# Patient Record
Sex: Female | Born: 1935 | Race: White | Hispanic: No
Health system: Southern US, Community
[De-identification: ages and names within clinical notes are randomized; demographics above are authoritative.]

## PROBLEM LIST (undated history)

## (undated) DIAGNOSIS — E039 Hypothyroidism, unspecified: Secondary | ICD-10-CM

## (undated) DIAGNOSIS — L84 Corns and callosities: Secondary | ICD-10-CM

## (undated) DIAGNOSIS — E785 Hyperlipidemia, unspecified: Secondary | ICD-10-CM

## (undated) DIAGNOSIS — M1710 Unilateral primary osteoarthritis, unspecified knee: Secondary | ICD-10-CM

## (undated) DIAGNOSIS — N390 Urinary tract infection, site not specified: Secondary | ICD-10-CM

## (undated) DIAGNOSIS — L659 Nonscarring hair loss, unspecified: Secondary | ICD-10-CM

## (undated) DIAGNOSIS — E559 Vitamin D deficiency, unspecified: Secondary | ICD-10-CM

## (undated) DIAGNOSIS — I639 Cerebral infarction, unspecified: Secondary | ICD-10-CM

## (undated) DIAGNOSIS — M2040 Other hammer toe(s) (acquired), unspecified foot: Secondary | ICD-10-CM

## (undated) DIAGNOSIS — I781 Nevus, non-neoplastic: Secondary | ICD-10-CM

## (undated) DIAGNOSIS — K430 Incisional hernia with obstruction, without gangrene: Secondary | ICD-10-CM

## (undated) DIAGNOSIS — G2581 Restless legs syndrome: Secondary | ICD-10-CM

## (undated) DIAGNOSIS — M179 Osteoarthritis of knee, unspecified: Secondary | ICD-10-CM

## (undated) DIAGNOSIS — M7540 Impingement syndrome of unspecified shoulder: Secondary | ICD-10-CM

## (undated) DIAGNOSIS — Z8709 Personal history of other diseases of the respiratory system: Secondary | ICD-10-CM

## (undated) DIAGNOSIS — K219 Gastro-esophageal reflux disease without esophagitis: Secondary | ICD-10-CM

## (undated) DIAGNOSIS — B354 Tinea corporis: Secondary | ICD-10-CM

## (undated) DIAGNOSIS — H269 Unspecified cataract: Secondary | ICD-10-CM

## (undated) DIAGNOSIS — G43909 Migraine, unspecified, not intractable, without status migrainosus: Secondary | ICD-10-CM

## (undated) DIAGNOSIS — M21619 Bunion of unspecified foot: Secondary | ICD-10-CM

## (undated) DIAGNOSIS — H905 Unspecified sensorineural hearing loss: Secondary | ICD-10-CM

## (undated) DIAGNOSIS — H409 Unspecified glaucoma: Secondary | ICD-10-CM

## (undated) DIAGNOSIS — I480 Paroxysmal atrial fibrillation: Secondary | ICD-10-CM

## (undated) DIAGNOSIS — M25559 Pain in unspecified hip: Secondary | ICD-10-CM

## (undated) DIAGNOSIS — L909 Atrophic disorder of skin, unspecified: Secondary | ICD-10-CM

## (undated) DIAGNOSIS — I10 Essential (primary) hypertension: Secondary | ICD-10-CM

## (undated) DIAGNOSIS — D126 Benign neoplasm of colon, unspecified: Secondary | ICD-10-CM

## (undated) DIAGNOSIS — Z87892 Personal history of anaphylaxis: Secondary | ICD-10-CM

## (undated) HISTORY — PX: NEUROPLASTY / TRANSPOSITION MEDIAN NERVE AT CARPAL TUNNEL: SUR893

## (undated) HISTORY — DX: Benign neoplasm of colon, unspecified: D12.6

## (undated) HISTORY — PX: COLONOSCOPY: SHX174

## (undated) HISTORY — DX: Unilateral primary osteoarthritis, unspecified knee: M17.10

## (undated) HISTORY — DX: Urinary tract infection, site not specified: N39.0

## (undated) HISTORY — PX: CHOLECYSTECTOMY: SHX55

## (undated) HISTORY — PX: TUBAL LIGATION: SHX77

## (undated) HISTORY — DX: Personal history of anaphylaxis: Z87.892

## (undated) HISTORY — DX: Restless legs syndrome: G25.81

## (undated) HISTORY — DX: Vitamin D deficiency, unspecified: E55.9

## (undated) HISTORY — DX: Personal history of other diseases of the respiratory system: Z87.09

## (undated) HISTORY — PX: TOTAL HIP ARTHROPLASTY: SHX124

## (undated) HISTORY — DX: Atrophic disorder of skin, unspecified: L90.9

## (undated) HISTORY — DX: Cerebral infarction, unspecified: I63.9

## (undated) HISTORY — DX: Incisional hernia with obstruction, without gangrene: K43.0

## (undated) HISTORY — DX: Corns and callosities: L84

## (undated) HISTORY — PX: OTHER SURGICAL HISTORY: SHX169

## (undated) HISTORY — DX: Nonscarring hair loss, unspecified: L65.9

## (undated) HISTORY — DX: Tinea corporis: B35.4

## (undated) HISTORY — DX: Unspecified cataract: H26.9

## (undated) HISTORY — PX: ABDOMINAL HERNIA REPAIR: SHX539

## (undated) HISTORY — DX: Unspecified sensorineural hearing loss: H90.5

## (undated) HISTORY — DX: Gastro-esophageal reflux disease without esophagitis: K21.9

## (undated) HISTORY — DX: Migraine, unspecified, not intractable, without status migrainosus: G43.909

## (undated) HISTORY — DX: Bunion of unspecified foot: M21.619

## (undated) HISTORY — DX: Other hammer toe(s) (acquired), unspecified foot: M20.40

## (undated) HISTORY — DX: Pain in unspecified hip: M25.559

## (undated) HISTORY — PX: HIATAL HERNIA REPAIR: SHX195

## (undated) HISTORY — DX: Osteoarthritis of knee, unspecified: M17.9

## (undated) HISTORY — DX: Impingement syndrome of unspecified shoulder: M75.40

## (undated) HISTORY — DX: Nevus, non-neoplastic: I78.1

---

## 1998-08-05 ENCOUNTER — Ambulatory Visit (HOSPITAL_COMMUNITY): Admission: RE | Admit: 1998-08-05 | Discharge: 1998-08-05 | Payer: Self-pay | Admitting: Obstetrics and Gynecology

## 1998-12-18 ENCOUNTER — Other Ambulatory Visit: Admission: RE | Admit: 1998-12-18 | Discharge: 1998-12-18 | Payer: Self-pay | Admitting: Obstetrics and Gynecology

## 1999-09-19 ENCOUNTER — Emergency Department (HOSPITAL_COMMUNITY): Admission: EM | Admit: 1999-09-19 | Discharge: 1999-09-19 | Payer: Self-pay | Admitting: Unknown Physician Specialty

## 2000-02-05 ENCOUNTER — Other Ambulatory Visit: Admission: RE | Admit: 2000-02-05 | Discharge: 2000-02-05 | Payer: Self-pay | Admitting: Obstetrics and Gynecology

## 2001-07-26 ENCOUNTER — Other Ambulatory Visit: Admission: RE | Admit: 2001-07-26 | Discharge: 2001-07-26 | Payer: Self-pay | Admitting: Obstetrics and Gynecology

## 2002-09-20 ENCOUNTER — Other Ambulatory Visit: Admission: RE | Admit: 2002-09-20 | Discharge: 2002-09-20 | Payer: Self-pay | Admitting: Obstetrics and Gynecology

## 2006-02-02 ENCOUNTER — Other Ambulatory Visit: Admission: RE | Admit: 2006-02-02 | Discharge: 2006-02-02 | Payer: Self-pay | Admitting: Obstetrics and Gynecology

## 2006-03-09 ENCOUNTER — Ambulatory Visit (HOSPITAL_COMMUNITY): Admission: RE | Admit: 2006-03-09 | Discharge: 2006-03-09 | Payer: Self-pay | Admitting: Obstetrics and Gynecology

## 2016-06-30 ENCOUNTER — Encounter (HOSPITAL_COMMUNITY): Payer: Self-pay | Admitting: Emergency Medicine

## 2016-06-30 ENCOUNTER — Emergency Department (HOSPITAL_COMMUNITY)
Admission: EM | Admit: 2016-06-30 | Discharge: 2016-06-30 | Disposition: A | Discharge status: Home / Self Care | Payer: Medicare Other | Attending: Emergency Medicine | Admitting: Emergency Medicine

## 2016-06-30 DIAGNOSIS — Z96649 Presence of unspecified artificial hip joint: Secondary | ICD-10-CM | POA: Insufficient documentation

## 2016-06-30 DIAGNOSIS — I998 Other disorder of circulatory system: Secondary | ICD-10-CM

## 2016-06-30 DIAGNOSIS — I10 Essential (primary) hypertension: Secondary | ICD-10-CM | POA: Diagnosis not present

## 2016-06-30 DIAGNOSIS — E039 Hypothyroidism, unspecified: Secondary | ICD-10-CM | POA: Diagnosis not present

## 2016-06-30 DIAGNOSIS — M79674 Pain in right toe(s): Secondary | ICD-10-CM | POA: Diagnosis present

## 2016-06-30 HISTORY — DX: Hyperlipidemia, unspecified: E78.5

## 2016-06-30 HISTORY — DX: Unspecified glaucoma: H40.9

## 2016-06-30 HISTORY — DX: Essential (primary) hypertension: I10

## 2016-06-30 HISTORY — DX: Hypothyroidism, unspecified: E03.9

## 2016-06-30 MED ORDER — HYDROCODONE-ACETAMINOPHEN 5-325 MG PO TABS
2.0000 | ORAL_TABLET | Freq: Once | ORAL | Status: AC
  Administered 2016-06-30: 2 via ORAL
  Filled 2016-06-30: qty 2

## 2016-06-30 MED ORDER — HYDROCODONE-ACETAMINOPHEN 5-325 MG PO TABS: 1.0000 | ORAL_TABLET | Freq: Four times a day (QID) | ORAL | 0 refills | Status: DC | PRN

## 2016-06-30 NOTE — ED Notes (Signed)
Pt took daily dose of Plavix, per vascular, vascular to see in office Monday.

## 2016-06-30 NOTE — Discharge Instructions (Signed)
Follow-up with Dr. Doren Custard as recommended to discuss your arteriogram.  Return to the emergency department if your symptoms significantly worsen or change.

## 2016-06-30 NOTE — ED Provider Notes (Signed)
[facility] DEPT Provider Note   CSN: [id number] Arrival date & time: 06/30/16  1417     History   Chief Complaint Chief Complaint  Patient presents with  . Toe Pain    ischemic toes    HPI Alyssa Odonnell is a 82 y.o. female.  Patient is a 82 year old female with past medical history of hypertension and high cholesterol. She presents for evaluation of painful discolored toes. She was sent here for possible ischemic leg. She has noticed these symptoms for the past 3 days and was sent here by her foot doctor for evaluation.   The history is provided by the patient.  Toe Pain  This is a new problem. Episode onset: 3 days ago. The problem occurs constantly. The problem has been gradually worsening. Pertinent negatives include no chest pain and no shortness of breath. The symptoms are aggravated by walking. Nothing relieves the symptoms. She has tried nothing for the symptoms. The treatment provided no relief.    Past Medical History:  Diagnosis Date  . Glaucoma   . Hyperlipidemia   . Hypertension   . Hypothyroid     There are no active problems to display for this patient.   Past Surgical History:  Procedure Laterality Date  . ABDOMINAL HERNIA REPAIR    . TOTAL HIP ARTHROPLASTY      OB History    No data available       Home Medications    Prior to Admission medications   Not on File    Family History History reviewed. No pertinent family history.  Social History Social History  Substance Use Topics  . Smoking status: Never Smoker  . Smokeless tobacco: Never Used  . Alcohol use No     Allergies   Aspirin; Mobic [meloxicam]; Nsaids; and Penicillins   Review of Systems Review of Systems  Respiratory: Negative for shortness of breath.   Cardiovascular: Negative for chest pain.  All other systems reviewed and are negative.    Physical Exam Updated Vital Signs BP 140/73   Pulse 93   Temp 98.5 F (36.9 C) (Oral)   Resp 19   Ht 5\' 3"  (1.6  m)   Wt 190 lb (86.2 kg)   SpO2 97%   BMI 33.66 kg/m   Physical Exam  Constitutional: She is oriented to person, place, and time. She appears well-developed and well-nourished. No distress.  HENT:  Head: Normocephalic and atraumatic.  Neck: Normal range of motion. Neck supple.  Cardiovascular: Normal rate, regular rhythm and normal heart sounds.  Exam reveals no gallop and no friction rub.   No murmur heard. Pulmonary/Chest: Effort normal and breath sounds normal. No respiratory distress. She has no wheezes.  Abdominal: Soft. Bowel sounds are normal. She exhibits no distension. There is no tenderness.  Musculoskeletal: Normal range of motion.  There is bluish discoloration of the third, fourth, and fifth digits of the right foot. They are tender to the touch. She has easily palpable DP pulses bilaterally.  Neurological: She is alert and oriented to person, place, and time.  Skin: Skin is warm and dry. She is not diaphoretic.  Nursing note and vitals reviewed.    ED Treatments / Results  Labs (all labs ordered are listed, but only abnormal results are displayed) Labs Reviewed  BASIC METABOLIC PANEL  CBC WITH DIFFERENTIAL/PLATELET    EKG   Radiology No results found.  Procedures Procedures (including critical care time)  Medications Ordered in ED Medications  HYDROcodone-acetaminophen (NORCO/VICODIN) 5-325  MG per tablet 2 tablet (2 tablets Oral Given 06/30/16 1855)     Initial Impression / Assessment and Plan / ED Course  I have reviewed the triage vital signs and the nursing notes.  Pertinent labs & imaging results that were available during my care of the patient were reviewed by me and considered in my medical decision making (see chart for details).  Clinical Course    The patient was evaluated by Dr. Scot Dock from vascular surgery who feels as though the cause of this was some sort of embolic phenomenon. He is going to arrange an arteriogram as an outpatient.  She will be discharged with instructions to follow-up with him to make these arrangements.  Final Clinical Impressions(s) / ED Diagnoses   Final diagnoses:  None    New Prescriptions New Prescriptions   No medications on file     Veryl Speak, MD 06/30/16 1936

## 2016-06-30 NOTE — ED Triage Notes (Signed)
Pt states she has trouble with circulation in right last 3 toes. States toes are turning blue. Went to foot Md today to have it checked out. Pt states MD told her he believes she has blood clot and the area is ischemic. Pt here today to have pain management and further treatment bc her MD could not see her until Thursday to treat the blockage.

## 2016-06-30 NOTE — ED Triage Notes (Signed)
Pt's MD wrote prescription for ultram and plavix. Pt picked up the prescription today and has not taken the medication yet.

## 2016-06-30 NOTE — Consult Note (Signed)
Patient name: Alyssa Odonnell MRN: [id number] DOB: 04/09/1934 Sex: female  REASON FOR CONSULT: Ischemic right third, fourth, and fifth toes. Consult is from the [facility].  HPI: Alyssa Odonnell is a 82 y.o. female, who noted the sudden discoloration of her right third fourth and fifth toes approximately 4 ays ago. She does not remember any specific injury to the foot.Prior to this event, she denies any history of claudication, rest pain, or nonhealing ulcers. The toes have been esp Painful. She presented to her podiatri who sent her to the emergency department.Vascular surgery was consult  for the ischemic toes.  Her risk factors for peripheral vascular disease include hypertension and hyperlipidemia. She denies any history of diabetes,or tobacco use. She does have a FHx of premature cardiovascular disease. Her father had an MI in his 46s.  Past Medical History:  Diagnosis Date  . Glaucoma   . Hyperlipidemia   . Hypertension   . Hypothyroid     History reviewed. No pertinent family history.  Noted above.  SOCIAL HISTORY: Social History   Social History  . Marital status: Widowed    Spouse name: Alyssa Odonnell  . Number of children: Alyssa Odonnell  . Years of education: Alyssa Odonnell   Occupational History  . Not on file.   Social History Main Topics  . Smoking status: Never Smoker  . Smokeless tobacco: Never Used  . Alcohol use No  . Drug use: Unknown  . Sexual activity: Not on file   Other Topics Concern  . Not on file   Social History Narrative  . No narrative on file    Allergies  Allergen Reactions  . Aspirin     Red splotches  . Mobic [Meloxicam]     rash  . Nsaids     Pt does not like to take them. No reaction  . Penicillins     rash    No current facility-administered medications for this encounter.    No current outpatient prescriptions on file.    REVIEW OF SYSTEMS:  [X]  denotes positive finding, [ ]  denotes negative finding Cardiac  Comments:  Chest pain or chest  pressure:    Shortness of breath upon exertion:    Short of breath when lying flat:    Irregular heart rhythm:        Vascular    Pain in calf, thigh, or hip brought on by ambulation:    Pain in feet at night that wakes you up from your sleep:     Blood clot in your veins:    Leg swelling:         Pulmonary    Oxygen at home:    Productive cough:     Wheezing:         Neurologic    Sudden weakness in arms or legs:     Sudden numbness in arms or legs:     Sudden onset of difficulty speaking or slurred speech:    Temporary loss of vision in one eye:     Problems with dizziness:         Gastrointestinal    Blood in stool:     Vomited blood:         Genitourinary    Burning when urinating:     Blood in urine:        Psychiatric    Major depression:         Hematologic    Bleeding problems:    Problems with  blood clotting too easily:        Skin    Rashes or ulcers: X Discoloration of the right third fourth and fifth toes.      Constitutional    Fever or chills:      PHYSICAL EXAM: Vitals:   06/30/16 1648 06/30/16 1759 06/30/16 1800 06/30/16 1830  BP: 144/71 132/75 136/80 147/77  Pulse: 93 95 93 90  Resp: 19     Temp:  98.5 F (36.9 C)    TempSrc:  Oral    SpO2: 98% 96% 97% 98%  Weight:      Height:        GENERAL: The patient is a well-nourished female, in no acute distress. The vital signs are documented above. CARDIAC: There is a regular rate and rhythm.  VASCULAR: I do not detect carotid bruits. She has palpable femoral pulses. I cannot palpate popliteal or pedal pulses. She has brisk but monophasic Doppler signals in the dorsalis pedis and posterior tibial positions bilaterally. She has no significant lower extremity swelling. PULMONARY: There is good air exchange bilaterally without wheezing or rales. ABDOMEN: Soft and non-tender with normal pitched bowel sounds.  MUSCULOSKELETAL: There are no major deformities. NEUROLOGIC: No focal weakness or  paresthesias are detected. SKIN: There are no ulcers or rashes noted. She has some bluish  discoloration of her right fourth and fifth toes. She does have some hyperpigemntaion c/w chronic venous insufficiency. PSYCHIATRIC: The patient has a normal affect.  MEDICAL ISSUES:  BLUE TOE SYNDROME RIGHT FOOT:  The patient has likely had an embolic event to her right foot. She has excellent perfusion of her foot by Doppler. I have recommended that we perform an elective arteriogram to look for a proximal source of embolization. I will try to schedule this for Sept 18th. I have reviewed with the patient the indications for arteriography. In addition, I have reviewed the potential complications of arteriography including but not limited to: Bleeding, arterial injury, arterial thrombosis, dye action, renal insufficiency, or other unpredictable medical problems. I have explained to the patient that if we find disease amenable to angioplasty we could potentially address this at the same time. I have discussed the potential complications of angioplasty and stenting, including but not limited to: Bleeding, arterial thrombosis, arterial injury, dissection, or the need for surgical intervention. She is not a smoker. She apparent statins and Not tolerant of aspirin. She was given a Rx for Plavix by the referring doctor and I have instructed her to begin taking this.     Deitra Mayo [facility] [phone]

## 2016-06-30 NOTE — ED Notes (Signed)
Vasc MD at bedside.

## 2016-07-01 ENCOUNTER — Other Ambulatory Visit: Payer: Self-pay

## 2016-07-06 ENCOUNTER — Telehealth: Payer: Self-pay | Admitting: Vascular Surgery

## 2016-07-06 ENCOUNTER — Other Ambulatory Visit: Payer: Self-pay | Admitting: *Deleted

## 2016-07-06 ENCOUNTER — Ambulatory Visit (HOSPITAL_COMMUNITY)
Admission: RE | Admit: 2016-07-06 | Discharge: 2016-07-06 | Disposition: A | Discharge status: Home / Self Care | Payer: Medicare Other | Source: Ambulatory Visit | Attending: Vascular Surgery | Admitting: Vascular Surgery

## 2016-07-06 ENCOUNTER — Encounter (HOSPITAL_COMMUNITY)
Admission: RE | Disposition: A | Discharge status: Home / Self Care | Payer: Self-pay | Source: Ambulatory Visit | Attending: Vascular Surgery

## 2016-07-06 DIAGNOSIS — I739 Peripheral vascular disease, unspecified: Secondary | ICD-10-CM

## 2016-07-06 DIAGNOSIS — I70201 Unspecified atherosclerosis of native arteries of extremities, right leg: Secondary | ICD-10-CM | POA: Insufficient documentation

## 2016-07-06 DIAGNOSIS — I998 Other disorder of circulatory system: Secondary | ICD-10-CM

## 2016-07-06 HISTORY — PX: PERIPHERAL VASCULAR CATHETERIZATION: SHX172C

## 2016-07-06 LAB — POCT I-STAT, CHEM 8
BUN: 36 mg/dL — ABNORMAL HIGH (ref 6–20)
Calcium, Ion: 1.21 mmol/L (ref 1.15–1.40)
Chloride: 107 mmol/L (ref 101–111)
Creatinine, Ser: 1.2 mg/dL — ABNORMAL HIGH (ref 0.44–1.00)
Glucose, Bld: 92 mg/dL (ref 65–99)
HCT: 41 % (ref 36.0–46.0)
Hemoglobin: 13.9 g/dL (ref 12.0–15.0)
Potassium: 4.7 mmol/L (ref 3.5–5.1)
Sodium: 142 mmol/L (ref 135–145)
TCO2: 25 mmol/L (ref 0–100)

## 2016-07-06 LAB — POCT ACTIVATED CLOTTING TIME
Activated Clotting Time: 186 seconds
Activated Clotting Time: 191 seconds
Activated Clotting Time: 153 seconds

## 2016-07-06 SURGERY — LOWER EXTREMITY ANGIOGRAPHY
Anesthesia: LOCAL | Laterality: Right

## 2016-07-06 MED ORDER — SODIUM CHLORIDE 0.9 % IV SOLN
1.0000 mL/kg/h | INTRAVENOUS | Status: DC
Start: 2016-07-06 — End: 2016-07-06

## 2016-07-06 MED ORDER — HEPARIN (PORCINE) IN NACL 2-0.9 UNIT/ML-% IJ SOLN
INTRAMUSCULAR | Status: AC
  Filled 2016-07-06: qty 1000

## 2016-07-06 MED ORDER — LIDOCAINE HCL (PF) 1 % IJ SOLN
INTRAMUSCULAR | Status: DC | PRN
  Administered 2016-07-06: 15 mL via SUBCUTANEOUS

## 2016-07-06 MED ORDER — LIDOCAINE HCL (PF) 1 % IJ SOLN
INTRAMUSCULAR | Status: AC
  Filled 2016-07-06: qty 30

## 2016-07-06 MED ORDER — HEPARIN SODIUM (PORCINE) 1000 UNIT/ML IJ SOLN
INTRAMUSCULAR | Status: AC
  Filled 2016-07-06: qty 1

## 2016-07-06 MED ORDER — FENTANYL CITRATE (PF) 100 MCG/2ML IJ SOLN
INTRAMUSCULAR | Status: AC
  Filled 2016-07-06: qty 2

## 2016-07-06 MED ORDER — MIDAZOLAM HCL 2 MG/2ML IJ SOLN
INTRAMUSCULAR | Status: AC
  Filled 2016-07-06: qty 2

## 2016-07-06 MED ORDER — IODIXANOL 320 MG/ML IV SOLN
INTRAVENOUS | Status: DC | PRN
  Administered 2016-07-06: 210 mL via INTRA_ARTERIAL

## 2016-07-06 MED ORDER — OXYCODONE-ACETAMINOPHEN 5-325 MG PO TABS
ORAL_TABLET | ORAL | Status: DC
Start: 2016-07-06 — End: 2016-07-06
  Filled 2016-07-06: qty 2

## 2016-07-06 MED ORDER — HEPARIN SODIUM (PORCINE) 1000 UNIT/ML IJ SOLN
INTRAMUSCULAR | Status: DC | PRN
  Administered 2016-07-06: 6000 [IU] via INTRAVENOUS
  Administered 2016-07-06: 2000 [IU] via INTRAVENOUS

## 2016-07-06 MED ORDER — MIDAZOLAM HCL 2 MG/2ML IJ SOLN
INTRAMUSCULAR | Status: DC | PRN
  Administered 2016-07-06: 0.5 mg via INTRAVENOUS

## 2016-07-06 MED ORDER — HEPARIN (PORCINE) IN NACL 2-0.9 UNIT/ML-% IJ SOLN
INTRAMUSCULAR | Status: DC | PRN
  Administered 2016-07-06: 1000 mL via INTRA_ARTERIAL

## 2016-07-06 MED ORDER — OXYCODONE-ACETAMINOPHEN 5-325 MG PO TABS
1.0000 | ORAL_TABLET | ORAL | Status: DC | PRN
  Administered 2016-07-06: 2 via ORAL

## 2016-07-06 MED ORDER — FENTANYL CITRATE (PF) 100 MCG/2ML IJ SOLN
INTRAMUSCULAR | Status: DC | PRN
  Administered 2016-07-06: 25 ug via INTRAVENOUS

## 2016-07-06 MED ORDER — SODIUM CHLORIDE 0.9 % IV SOLN: INTRAVENOUS | Status: DC

## 2016-07-06 SURGICAL SUPPLY — 18 items
BALLN ARMADA 4X60X135 (BALLOONS) ×2
BALLOON ARMADA 4X60X135 (BALLOONS) ×1 IMPLANT
CATH ANGIO 5F PIGTAIL 65CM (CATHETERS) ×2 IMPLANT
CATH CROSS OVER TEMPO 5F (CATHETERS) ×2 IMPLANT
CATH STRAIGHT 5FR 65CM (CATHETERS) ×2 IMPLANT
DEVICE CONTINUOUS FLUSH (MISCELLANEOUS) ×2 IMPLANT
KIT ENCORE 26 ADVANTAGE (KITS) ×2 IMPLANT
KIT MICROINTRODUCER STIFF 5F (SHEATH) ×2 IMPLANT
KIT PV (KITS) ×2 IMPLANT
SHEATH FLEX ANSEL ST 6FR 45CM (SHEATH) ×2 IMPLANT
SHEATH PINNACLE 5F 10CM (SHEATH) ×2 IMPLANT
STENT INNOVA 5X60X130 (Permanent Stent) ×2 IMPLANT
SYR MEDRAD MARK V 150ML (SYRINGE) ×2 IMPLANT
TAPE RADIOPAQUE TURBO (MISCELLANEOUS) ×2 IMPLANT
TRANSDUCER W/STOPCOCK (MISCELLANEOUS) ×2 IMPLANT
TRAY PV CATH (CUSTOM PROCEDURE TRAY) ×2 IMPLANT
WIRE HITORQ VERSACORE ST 145CM (WIRE) ×2 IMPLANT
WIRE VERSACORE LOC 115CM (WIRE) ×2 IMPLANT

## 2016-07-06 NOTE — Telephone Encounter (Signed)
-----  Message from Mena Goes, RN sent at 07/06/2016 10:33 AM EDT ----- Regarding: schedule appt and ABI   ----- Message ----- From: Angelia Mould, MD Sent: 07/06/2016   9:20 AM To: [facility] Charge Pool Subject: charge and f/u                                 PROCEDURE:  1. Ultrasound-guided access to the left common femoral artery 2. Aortogram with bilateral iliac arteriogram 3. Selective catheterization of the right external iliac artery with right lower extremity runoff 4. Selective catheterization of the right superficial femoral artery.5. Angioplasty and stenting of a 90% right superficial femoral artery stenosis (5 mm x 6 cm self-expanding stent). 5. Postdilatation with a 4 mm x 6 cm balloon to 20 atm 6. Retrograde left femoral arteriogram with left lower extremity runoff  SURGEON: Judeth Cornfield. Scot Dock, MD, FACS  She will need a follow up visit with ABIs in 3-4 weeks. Thank you. CD

## 2016-07-06 NOTE — Telephone Encounter (Signed)
Tried to leave message with family member, assuming bad connection, mailed appt letter to home address. 07/06/16 beg

## 2016-07-06 NOTE — Discharge Instructions (Signed)

## 2016-07-06 NOTE — Progress Notes (Signed)
Site area: LFA Site Prior to Removal:  Level 0 Pressure Applied For:29min Manual:   yes Patient Status During Pull:  stable Post Pull Site:  Level 0 Post Pull Instructions Given:  yes Post Pull Pulses Present: doppler Dressing Applied:  tegaderm Bedrest begins @ [id number] till 1440 Comments:

## 2016-07-06 NOTE — Op Note (Signed)
PATIENT: Alyssa Odonnell   MRN: [id number] DOB: May 04, 1934    DATE OF PROCEDURE: 07/06/2016  INDICATIONS: Alyssa Odonnell is a 82 y.o. female who presented with the acute onset of discoloration of her toes in the right foot. It was felt that she had an embolic event. She presents for arteriography and possible intervention.  PROCEDURE:  1. Ultrasound-guided access to the left common femoral artery 2. Aortogram with bilateral iliac arteriogram 3. Selective catheterization of the right external iliac artery with right lower extremity runoff 4. Selective catheterization of the right superficial femoral artery.5. Angioplasty and stenting of a 90% right superficial femoral artery stenosis (5 mm x 6 cm self-expanding stent). 5. Postdilatation with a 4 mm x 6 cm balloon to 20 atm 6. Retrograde left femoral arteriogram with left lower extremity runoff  SURGEON: Judeth Cornfield. Scot Dock, MD, FACS  ANESTHESIA: Local with sedation   EBL: Minimal   TECHNIQUE: The patient was taken to the peripheral vascular lab. The patient was sedated. The period of conscious sedation was 71 minutes.  During that time period, I was present face-to-face 100% of the time.  The patient was administered 0.5 mg of Versed and 25 g of fentanyl.). The patient's heart rate, blood pressure, and oxygen saturation were monitored by the nurse continuously during the procedure.  Both groins were prepped and draped in the usual sterile fashion. Under ultrasound guidance, after the skin was anesthetized, the left common femoral artery was cannulated with a micropuncture needle and a micropuncture sheath introduced over a wire. This was exchanged for a 5 French sheath over a versa core wire. A pigtail catheter was positioned at the L1 vertebral body and flush aortogram obtained. The catheter was in positioned above the aortic bifurcation and an oblique iliac projection was obtained at  An LAO projection. I then exchanged the pigtail catheter  for a crossover catheter which was positioned into the proximal right common iliac artery. The wire was advanced into the external iliac artery and then the crossover catheter exchanged for a straight catheter. Selective right external iliac arterial was obtained with right lower extremity runoff.  There was a 90% focal right superficial femoral artery stenosis which I felt was most likely the source for embolization. I elected to address this with angioplasty and stenting. The wire was advanced into the superficial femoral artery and the 5 French sheath exchanged for a long 6 [place] sheath. The stenosis and artery above and below the area were assessed. There was also a small filling defect distally and I elected to include this within the stented area. The patient received 6000 units IV heparin and subsequent received an additional 2000 units of IV heparin.  A 5 mm x 6 cm self expanding stent was selected and positioned across the area of concern. This was deployed without difficulty. I then did a postdilatation with a 4 mm x 6 cm balloon to 20 atm. Completion film showed an excellent result. I then shot another tibial shot to be sure there was no evidence of embolization and there was not.  The wire was removed and then the sheath pulled back into the left external iliac artery in a retrograde left femoral arteriogram was obtained with left lower extremity runoff. At the completion of the procedure the patient was transferred to the holding area for removal of the sheath. No immediate competitions were noted.   FINDINGS:   1. There 2 renal arteries on the right and a single renal artery on  the left. No significant renal artery stenosis is identified. The infrarenal aorta, bilateral common iliac arteries, bilateral external iliac arteries and bilateral hypogastric arteries are patent.  2. On the right side, which is the site of concern, the common femoral deep femoral and proximal superficial femoral  artery are patent. There was a focal 90% stenosis in the mid thigh in the superficial femoral artery with a filling defect associated with this suggesting clot and suggesting that this was the source of embolization. There was a small filling defect also just below this. I elected to address this with angioplasty and stenting as described above. Completion films showed no residual stenosis. Below this area of the distal superficial femoral artery, popliteal artery, anterior tibial and area arteries are patent. The posterior tibial artery is occluded.  3. On the left side, the common femoral and deep femoral artery are patent. There is mild diffuse disease in the proximal superficial femoral artery. The superficial femoral arteries and occluded at the adductor canal with extensive collaterals. There is reconstitution of the above-knee popliteal artery. The below-knee pop through artery is patent. Anterior tibial, peroneal, posterior tibial arteries are patent although there is diffuse disease of the peroneal artery.   PRE:  90% stenosis POST: NO RESIDUAL STENOSIS  STENT: 5 mm x 6 cm self-expanding stent   Deitra Mayo, MD, FACS [facility]  DATE OF DICTATION:   07/06/2016

## 2016-07-07 ENCOUNTER — Encounter (HOSPITAL_COMMUNITY): Payer: Self-pay | Admitting: Vascular Surgery

## 2016-07-15 ENCOUNTER — Encounter: Payer: Self-pay | Admitting: Vascular Surgery

## 2016-07-20 ENCOUNTER — Ambulatory Visit (HOSPITAL_COMMUNITY)
Admit: 2016-07-20 | Discharge: 2016-07-20 | Disposition: A | Discharge status: Home / Self Care | Payer: Medicare Other | Source: Ambulatory Visit | Attending: Surgery | Admitting: Surgery

## 2016-07-20 DIAGNOSIS — I10 Essential (primary) hypertension: Secondary | ICD-10-CM | POA: Insufficient documentation

## 2016-07-20 DIAGNOSIS — I739 Peripheral vascular disease, unspecified: Secondary | ICD-10-CM | POA: Diagnosis present

## 2016-07-22 ENCOUNTER — Ambulatory Visit (INDEPENDENT_AMBULATORY_CARE_PROVIDER_SITE_OTHER): Payer: Medicare Other | Admitting: Vascular Surgery

## 2016-07-22 ENCOUNTER — Encounter: Payer: Self-pay | Admitting: Vascular Surgery

## 2016-07-22 VITALS — BP 115/77 | HR 86 | Temp 98.5°F | Resp 20 | Ht 63.0 in | Wt 185.0 lb

## 2016-07-22 DIAGNOSIS — I739 Peripheral vascular disease, unspecified: Secondary | ICD-10-CM | POA: Diagnosis not present

## 2016-07-22 MED ORDER — CLOPIDOGREL BISULFATE 75 MG PO TABS: 75.0000 mg | ORAL_TABLET | Freq: Every day | ORAL | 0 refills | Status: DC

## 2016-07-22 MED ORDER — CLOPIDOGREL BISULFATE 75 MG PO TABS
75.0000 mg | ORAL_TABLET | Freq: Every day | ORAL | 5 refills | Status: DC
Start: 2016-07-22 — End: 2018-02-02

## 2016-07-22 NOTE — Progress Notes (Signed)
Patient name: Alyssa Odonnell MRN: [id number] DOB: Mar 01, 1934 Sex: female  REASON FOR VISIT: Follow up after angioplasty and stenting of the right superficial femoral artery.  HPI: Alyssa Odonnell is a 82 y.o. female who presented with the acute onset of discoloration of her toes on the right foot. It was felt that she had an embolic event and was set up for arteriography in order to look for a source of embolization. On the right side, she had a focal 90% stenosis in the mid thigh in the superficial femoral artery with a filling defect suggesting clot. I felt that this was likely the source of embolization. She underwent successful angioplasty and stenting of a 90% right superficial femoral artery stenosis with a 5 mm x 6 on meters self-expanding stent and post dilatation with a 4 mm x 6 cm balloon area had an excellent result with no residual stenosis. She comes in for follow up visit. She is on Plavix.  She has no specific complaints. She states that her toes feel much better. She denies significant claudication or rest pain.   Current Outpatient Prescriptions  Medication Sig Dispense Refill  . Cholecalciferol (VITAMIN D3) 5000 units TABS Take 5,000 Units by mouth daily.    . clindamycin (CLEOCIN) 150 MG capsule Take 150 mg by mouth 4 (four) times daily. 5 day therapy course patient to complete on 07/05/16.    . clopidogrel (PLAVIX) 75 MG tablet Take 75 mg by mouth every evening.     . furosemide (LASIX) 40 MG tablet Take 40 mg by mouth daily as needed for edema. (swelling in feet)    . HYDROcodone-acetaminophen (NORCO/VICODIN) 5-325 MG tablet Take 1-2 tablets by mouth every 6 (six) hours as needed. 20 tablet 0  . latanoprost (XALATAN) 0.005 % ophthalmic solution Place 1 drop into both eyes at bedtime.    Marland Kitchen levothyroxine (SYNTHROID, LEVOTHROID) 100 MCG tablet Take 100 mcg by mouth daily before breakfast.     . olmesartan-hydrochlorothiazide (BENICAR HCT) 20-12.5 MG tablet Take 1 tablet by mouth  daily.    Marland Kitchen omeprazole (PRILOSEC) 20 MG capsule Take 20 mg by mouth daily before breakfast.     . sertraline (ZOLOFT) 100 MG tablet Take 150 mg by mouth daily.     No current facility-administered medications for this visit.     REVIEW OF SYSTEMS:  [X]  denotes positive finding, [ ]  denotes negative finding Cardiac  Comments:  Chest pain or chest pressure:    Shortness of breath upon exertion:    Short of breath when lying flat:    Irregular heart rhythm:    Constitutional    Fever or chills:      PHYSICAL EXAM: Vitals:   07/22/16 1403  BP: 115/77  Pulse: 86  Resp: 20  Temp: 98.5 F (36.9 C)  TempSrc: Oral  SpO2: 97%  Weight: 185 lb (83.9 kg)  Height: 5\' 3"  (1.6 m)    GENERAL: The patient is a well-nourished female, in no acute distress. The vital signs are documented above. CARDIOVASCULAR: There is a regular rate and rhythm. PULMONARY: There is good air exchange bilaterally without wheezing or rales. She has a palpable right dorsalis pedis pulse. There is no hematoma in the left groin.  ARTERIAL DOPPLER STUDY: I have reviewed her Doppler study that was done on 07/20/2016. On the right side, where she had the angioplasty and stenting, ABIs 92%. She has a biphasic posterior tibial signal and a triphasic dorsalis pedis signal. On the left  side ABI 70%.  MEDICAL ISSUES:  STATUS POST ANGIOPLASTY AND STENTING OF THE RIGHT SUPERFICIAL FEMORAL ARTERY: The patient is doing well status post angioplasty and stenting of the right superficial femoral artery. ABIs 92% with normal Doppler signals in her foot. I have recommended that she continue the Plavix for 6 months. I've ordered a duplex of her stent and ABIs in 3 months and will see her back at that time. She knows to call sooner if she has problems.  Deitra Mayo [facility] [phone]

## 2016-07-24 NOTE — Addendum Note (Signed)
Addended by: Kaleen Mask on: 07/24/2016 02:07 PM   Modules accepted: Orders

## 2016-08-03 ENCOUNTER — Encounter (HOSPITAL_COMMUNITY): Payer: Self-pay | Admitting: Radiology

## 2016-08-03 ENCOUNTER — Emergency Department (HOSPITAL_COMMUNITY): Payer: Medicare Other

## 2016-08-03 ENCOUNTER — Emergency Department (HOSPITAL_COMMUNITY)
Admission: EM | Admit: 2016-08-03 | Discharge: 2016-08-04 | Disposition: A | Discharge status: Home / Self Care | Payer: Medicare Other | Attending: Emergency Medicine | Admitting: Emergency Medicine

## 2016-08-03 DIAGNOSIS — I10 Essential (primary) hypertension: Secondary | ICD-10-CM | POA: Diagnosis not present

## 2016-08-03 DIAGNOSIS — M549 Dorsalgia, unspecified: Secondary | ICD-10-CM | POA: Insufficient documentation

## 2016-08-03 DIAGNOSIS — E039 Hypothyroidism, unspecified: Secondary | ICD-10-CM | POA: Insufficient documentation

## 2016-08-03 DIAGNOSIS — R05 Cough: Secondary | ICD-10-CM | POA: Insufficient documentation

## 2016-08-03 DIAGNOSIS — Z79899 Other long term (current) drug therapy: Secondary | ICD-10-CM | POA: Insufficient documentation

## 2016-08-03 DIAGNOSIS — R0602 Shortness of breath: Secondary | ICD-10-CM | POA: Diagnosis present

## 2016-08-03 DIAGNOSIS — R0789 Other chest pain: Secondary | ICD-10-CM | POA: Diagnosis not present

## 2016-08-03 DIAGNOSIS — R000 Tachycardia, unspecified: Secondary | ICD-10-CM | POA: Diagnosis not present

## 2016-08-03 DIAGNOSIS — R197 Diarrhea, unspecified: Secondary | ICD-10-CM | POA: Insufficient documentation

## 2016-08-03 DIAGNOSIS — R109 Unspecified abdominal pain: Secondary | ICD-10-CM | POA: Insufficient documentation

## 2016-08-03 DIAGNOSIS — R110 Nausea: Secondary | ICD-10-CM | POA: Insufficient documentation

## 2016-08-03 DIAGNOSIS — R6889 Other general symptoms and signs: Secondary | ICD-10-CM

## 2016-08-03 LAB — CBC WITH DIFFERENTIAL/PLATELET
Basophils Absolute: 0 10*3/uL (ref 0.0–0.1)
Basophils Relative: 0 %
Eosinophils Absolute: 0.2 10*3/uL (ref 0.0–0.7)
Eosinophils Relative: 2 %
HCT: 40.2 % (ref 36.0–46.0)
Hemoglobin: 13 g/dL (ref 12.0–15.0)
Lymphocytes Relative: 14 %
Lymphs Abs: 1.2 10*3/uL (ref 0.7–4.0)
MCH: 30.3 pg (ref 26.0–34.0)
MCHC: 32.3 g/dL (ref 30.0–36.0)
MCV: 93.7 fL (ref 78.0–100.0)
Monocytes Absolute: 0.5 10*3/uL (ref 0.1–1.0)
Monocytes Relative: 6 %
Neutro Abs: 6.7 10*3/uL (ref 1.7–7.7)
Neutrophils Relative %: 78 %
Platelets: 139 10*3/uL — ABNORMAL LOW (ref 150–400)
RBC: 4.29 MIL/uL (ref 3.87–5.11)
RDW: 14.1 % (ref 11.5–15.5)
WBC: 8.5 10*3/uL (ref 4.0–10.5)

## 2016-08-03 LAB — COMPREHENSIVE METABOLIC PANEL
ALT: 51 U/L (ref 14–54)
AST: 43 U/L — ABNORMAL HIGH (ref 15–41)
Albumin: 4.3 g/dL (ref 3.5–5.0)
Alkaline Phosphatase: 71 U/L (ref 38–126)
Anion gap: 9 (ref 5–15)
BUN: 44 mg/dL — ABNORMAL HIGH (ref 6–20)
CO2: 22 mmol/L (ref 22–32)
Calcium: 9.2 mg/dL (ref 8.9–10.3)
Chloride: 110 mmol/L (ref 101–111)
Creatinine, Ser: 1.42 mg/dL — ABNORMAL HIGH (ref 0.44–1.00)
GFR calc Af Amer: 40 mL/min — ABNORMAL LOW (ref >60)
GFR calc non Af Amer: 34 mL/min — ABNORMAL LOW (ref >60)
Glucose, Bld: 123 mg/dL — ABNORMAL HIGH (ref 65–99)
Potassium: 4.2 mmol/L (ref 3.5–5.1)
Sodium: 141 mmol/L (ref 135–145)
Total Bilirubin: 0.6 mg/dL (ref 0.3–1.2)
Total Protein: 7.7 g/dL (ref 6.5–8.1)

## 2016-08-03 LAB — D-DIMER, QUANTITATIVE: D-Dimer, Quant: 0.8 ug/mL-FEU — ABNORMAL HIGH (ref 0.00–0.50)

## 2016-08-03 LAB — LIPASE, BLOOD: Lipase: 30 U/L (ref 11–51)

## 2016-08-03 LAB — I-STAT TROPONIN, ED: Troponin i, poc: 0 ng/mL (ref 0.00–0.08)

## 2016-08-03 MED ORDER — SODIUM CHLORIDE 0.9 % IV BOLUS (SEPSIS)
1000.0000 mL | Freq: Once | INTRAVENOUS | Status: AC
  Administered 2016-08-03: 1000 mL via INTRAVENOUS

## 2016-08-03 MED ORDER — ONDANSETRON HCL 4 MG/2ML IJ SOLN
4.0000 mg | Freq: Once | INTRAMUSCULAR | Status: AC
  Administered 2016-08-03: 4 mg via INTRAVENOUS
  Filled 2016-08-03: qty 2

## 2016-08-03 MED ORDER — ALBUTEROL SULFATE HFA 108 (90 BASE) MCG/ACT IN AERS
2.0000 | INHALATION_SPRAY | Freq: Once | RESPIRATORY_TRACT | Status: AC
  Administered 2016-08-04: 2 via RESPIRATORY_TRACT
  Filled 2016-08-03: qty 6.7

## 2016-08-03 MED ORDER — ALBUTEROL SULFATE (2.5 MG/3ML) 0.083% IN NEBU
2.5000 mg | INHALATION_SOLUTION | Freq: Once | RESPIRATORY_TRACT | Status: AC
  Administered 2016-08-03: 2.5 mg via RESPIRATORY_TRACT
  Filled 2016-08-03: qty 3

## 2016-08-03 MED ORDER — IOPAMIDOL (ISOVUE-370) INJECTION 76%
80.0000 mL | Freq: Once | INTRAVENOUS | Status: AC | PRN
  Administered 2016-08-03: 80 mL via INTRAVENOUS

## 2016-08-03 NOTE — ED Triage Notes (Signed)
Pt states that x 2 days she has had a productive cough and SOB. Also reports diarrhea. Alert and oriented.

## 2016-08-03 NOTE — ED Notes (Signed)
RN will be starting an IV line and blood work

## 2016-08-03 NOTE — ED Provider Notes (Signed)
[facility] DEPT Provider Note   CSN: [id number] Arrival date & time: 08/03/16  1922     History   Chief Complaint Chief Complaint  Patient presents with  . Shortness of Breath    HPI Alyssa Odonnell is a 82 y.o. female.  HPI  82 year old female presents with a chief complaint of shortness of breath. Has been short of breath over the past 5 days. Has had some cough that is not productive as well. No fevers. About 3 days ago she felt like she had some asthma, which she has never had before. Her sister is staying with her and smokes frequently and she is worried this contributing. Her chest has been feeling tight. After the coughing started she also started having diarrhea every time she ate. This is a clear watery diarrhea. Nausea without vomiting. Feels diffuse body aches which she states is typical for her but has worsened over the last 3 days.  Past Medical History:  Diagnosis Date  . Glaucoma   . Hyperlipidemia   . Hypertension   . Hypothyroid     There are no active problems to display for this patient.   Past Surgical History:  Procedure Laterality Date  . ABDOMINAL HERNIA REPAIR    . PERIPHERAL VASCULAR CATHETERIZATION Right 07/06/2016   Procedure: Lower Extremity Angiography;  Surgeon: Angelia Mould, MD;  Location: [facility];  Service: Cardiovascular;  Laterality: Right;  . PERIPHERAL VASCULAR CATHETERIZATION Right 07/06/2016   Procedure: Peripheral Vascular Intervention;  Surgeon: Angelia Mould, MD;  Location: [facility];  Service: Cardiovascular;  Laterality: Right;  Superficial femoral  . TOTAL HIP ARTHROPLASTY      OB History    No data available       Home Medications    Prior to Admission medications   Medication Sig Start Date End Date Taking? Authorizing Provider  acetaminophen (TYLENOL) 500 MG tablet Take 1,000 mg by mouth daily.   Yes Historical Provider, MD  Cholecalciferol (VITAMIN D3) 5000 units TABS Take 5,000  Units by mouth daily.   Yes Historical Provider, MD  clopidogrel (PLAVIX) 75 MG tablet Take 1 tablet (75 mg total) by mouth daily. 07/22/16  Yes Angelia Mould, MD  furosemide (LASIX) 40 MG tablet Take 40 mg by mouth daily as needed for edema. (swelling in feet) 05/11/16  Yes Historical Provider, MD  latanoprost (XALATAN) 0.005 % ophthalmic solution Place 1 drop into both eyes at bedtime. 05/11/16  Yes Historical Provider, MD  levothyroxine (SYNTHROID, LEVOTHROID) 100 MCG tablet Take 100 mcg by mouth daily before breakfast.  05/11/16  Yes Historical Provider, MD  olmesartan-hydrochlorothiazide (BENICAR HCT) 20-12.5 MG tablet Take 1 tablet by mouth daily.   Yes Historical Provider, MD  omeprazole (PRILOSEC) 20 MG capsule Take 20 mg by mouth daily before breakfast.  05/11/16  Yes Historical Provider, MD  sertraline (ZOLOFT) 100 MG tablet Take 150 mg by mouth daily. 05/11/16  Yes Historical Provider, MD  clopidogrel (PLAVIX) 75 MG tablet Take 1 tablet (75 mg total) by mouth daily. Patient not taking: Reported on 08/03/2016 07/22/16   Angelia Mould, MD  guaiFENesin (ROBITUSSIN) 100 MG/5ML liquid Take 5-10 mLs (100-200 mg total) by mouth every 4 (four) hours as needed for cough. 08/04/16   Sherwood Gambler, MD  HYDROcodone-acetaminophen (NORCO/VICODIN) 5-325 MG tablet Take 1-2 tablets by mouth every 6 (six) hours as needed. Patient not taking: Reported on 08/03/2016 06/30/16   Veryl Speak, MD  ondansetron (ZOFRAN ODT) 4 MG  disintegrating tablet Take 1 tablet (4 mg total) by mouth every 8 (eight) hours as needed for nausea or vomiting. 08/04/16   Sherwood Gambler, MD    Family History No family history on file.  Social History Social History  Substance Use Topics  . Smoking status: Never Smoker  . Smokeless tobacco: Never Used  . Alcohol use No     Allergies   Aspirin; Atorvastatin; Mobic [meloxicam]; Nabumetone; Nsaids; Procaine; Rosuvastatin; Simvastatin; and Penicillins   Review of  Systems Review of Systems  Constitutional: Negative for fever.  Respiratory: Positive for cough, chest tightness and shortness of breath.   Cardiovascular: Negative for chest pain and leg swelling.  Gastrointestinal: Positive for abdominal pain (when coughing), diarrhea and nausea.  Genitourinary: Negative for dysuria.  Musculoskeletal: Positive for arthralgias and back pain.  All other systems reviewed and are negative.    Physical Exam Updated Vital Signs BP 126/63   Pulse 100   Temp 98.4 F (36.9 C) (Oral)   Resp (!) 29   SpO2 94%   Physical Exam  Constitutional: She is oriented to person, place, and time. She appears well-developed and well-nourished.  HENT:  Head: Normocephalic and atraumatic.  Right Ear: External ear normal.  Left Ear: External ear normal.  Nose: Nose normal.  Eyes: Right eye exhibits no discharge. Left eye exhibits no discharge.  Cardiovascular: Regular rhythm and normal heart sounds.  Tachycardia present.   Pulmonary/Chest: Effort normal. No accessory muscle usage. No tachypnea. She has decreased breath sounds in the right lower field and the left lower field.  Abdominal: Soft. There is no tenderness.  Neurological: She is alert and oriented to person, place, and time.  Skin: Skin is warm and dry.  Nursing note and vitals reviewed.    ED Treatments / Results  Labs (all labs ordered are listed, but only abnormal results are displayed) Labs Reviewed  COMPREHENSIVE METABOLIC PANEL - Abnormal; Notable for the following:       Result Value   Glucose, Bld 123 (*)    BUN 44 (*)    Creatinine, Ser 1.42 (*)    AST 43 (*)    GFR calc non Af Amer 34 (*)    GFR calc Af Amer 40 (*)    All other components within normal limits  CBC WITH DIFFERENTIAL/PLATELET - Abnormal; Notable for the following:    Platelets 139 (*)    All other components within normal limits  D-DIMER, QUANTITATIVE (NOT AT [facility]) - Abnormal; Notable for the following:    D-Dimer,  Quant 0.80 (*)    All other components within normal limits  LIPASE, BLOOD  INFLUENZA PANEL BY PCR (TYPE A & B, H1N1)  I-STAT TROPOININ, ED    EKG  EKG Interpretation  Date/Time:  Monday August 03 2016 19:46:39 EDT Ventricular Rate:  109 PR Interval:    QRS Duration: 85 QT Interval:  305 QTC Calculation: 411 R Axis:   51 Text Interpretation:  Sinus tachycardia Low voltage, precordial leads rate is faster, otherwise no significant change since Sept 2017 Confirmed by Regenia Skeeter MD, Christi Wirick [phone]) on 08/03/2016 8:58:42 PM       Radiology Dg Chest 2 View  Result Date: 08/03/2016 CLINICAL DATA:  Acute onset of shortness of breath and cough. Initial encounter. EXAM: CHEST  2 VIEW COMPARISON:  Chest radiograph performed 07/20/2014 FINDINGS: The lungs are well-aerated and clear. There is no evidence of focal opacification, pleural effusion or pneumothorax. The heart is borderline enlarged. No acute osseous abnormalities  are seen. A large hiatal hernia is again noted, filled with fluid, extending to the right side of the chest. IMPRESSION: 1. Borderline cardiomegaly.  Lungs remain grossly clear. 2. Large hiatal hernia again noted, filled with fluid. Electronically Signed   By: Garald Balding M.D.   On: 08/03/2016 20:38   Ct Angio Chest Pe W/cm &/or Wo Cm  Result Date: 08/03/2016 CLINICAL DATA:  [age]-year-old female with shortness of breath and productive cough. Evaluate for PE. Elevated D-dimer. EXAM: CT ANGIOGRAPHY CHEST WITH CONTRAST TECHNIQUE: Multidetector CT imaging of the chest was performed using the standard protocol during bolus administration of intravenous contrast. Multiplanar CT image reconstructions and MIPs were obtained to evaluate the vascular anatomy. CONTRAST:  80 cc Isovue 370 COMPARISON:  Chest radiograph dated 08/03/2016 FINDINGS: Cardiovascular: There is moderate atherosclerotic calcification of the thoracic aorta. There is no aneurysmal dilatation or evidence of  dissection. The origins of the great vessels of the aortic arch appear patent. Evaluation of the pulmonary arteries is somewhat limited due to respiratory motion artifact. There is mild prominence of the central pulmonary arteries suggestive of a degree of pulmonary hypertension. No definite central pulmonary artery embolus identified. There is no cardiomegaly or pericardial effusion. There is coronary vascular calcification. Mediastinum/Nodes: There is no hilar or mediastinal adenopathy. There is a large hiatal hernia containing the majority of the stomach. There is rotation of the stomach along its long axis compatible with an organo-axial volvulus. No definite evidence of gastric outlet obstruction at this time. Lungs/Pleura: The lungs are clear. There is no pleural effusion or pneumothorax. The central airways are patent. There is partial compressive atelectasis of the right lung base secondary to large hiatal hernia. Upper Abdomen: No acute abnormality. Musculoskeletal: Mild degenerative changes of the spine. No acute fracture. Review of the MIP images confirms the above findings. IMPRESSION: No acute intrathoracic pathology. No definite CT evidence of central pulmonary artery embolus. Large hiatal hernia containing the stomach with gastric volvulus. No evidence of gastric outlet obstruction. Electronically Signed   By: Anner Crete M.D.   On: 08/03/2016 23:29    Procedures Procedures (including critical care time)  Medications Ordered in ED Medications  sodium chloride 0.9 % bolus 1,000 mL (0 mLs Intravenous Stopped 08/03/16 2254)  ondansetron (ZOFRAN) injection 4 mg (4 mg Intravenous Given 08/03/16 2130)  albuterol (PROVENTIL) (2.5 MG/3ML) 0.083% nebulizer solution 2.5 mg (2.5 mg Nebulization Given 08/03/16 2136)  sodium chloride 0.9 % bolus 1,000 mL (0 mLs Intravenous Stopped 08/03/16 2356)  iopamidol (ISOVUE-370) 76 % injection 80 mL (80 mLs Intravenous Contrast Given 08/03/16 2232)    albuterol (PROVENTIL HFA;VENTOLIN HFA) 108 (90 Base) MCG/ACT inhaler 2 puff (2 puffs Inhalation Given 08/04/16 0003)     Initial Impression / Assessment and Plan / ED Course  I have reviewed the triage vital signs and the nursing notes.  Pertinent labs & imaging results that were available during my care of the patient were reviewed by me and considered in my medical decision making (see chart for details).  Clinical Course  Comment By Time  Sounds like a viral illness, possibly flu. But no fevers here. Check labs, ECG, IV fluids, zofran. Given no fever, will eval for other possibilities such as PE with ddimer Sherwood Gambler, MD 10/16 2110  Somewhat improved with albuterol. HR better after fluids. Mildly increased Cr and BUn from baseline. Will do 2nd liter of fluids. Given mildly elevated ddimer, CTA (low dose per radiology) Sherwood Gambler, MD 10/16 2224  PE study negative. She has known about hiatal hernia. Feels better, feels well enough for D/C. Probably viral/flu-like illness. No signs of bacterial illness. No urinary symptoms. No respiratory distress at this time. Will push oral fluids at home, tessalon for cough, albuterol inhaler prn, and symptomatic care Sherwood Gambler, MD 10/16 2338  Influenza is still pending. Out of window for tamiflu anyway. Patient is HDS and besides feeling chills (temp 98 on recheck) she feels better. Albuterol prn, tessalon, zofran and plenty of fluids at home. F/u with PCP.  Strict return precautions Sherwood Gambler, MD 10/17 0015    Final Clinical Impressions(s) / ED Diagnoses   Final diagnoses:  Flu-like symptoms    New Prescriptions New Prescriptions   GUAIFENESIN (ROBITUSSIN) 100 MG/5ML LIQUID    Take 5-10 mLs (100-200 mg total) by mouth every 4 (four) hours as needed for cough.   ONDANSETRON (ZOFRAN ODT) 4 MG DISINTEGRATING TABLET    Take 1 tablet (4 mg total) by mouth every 8 (eight) hours as needed for nausea or vomiting.     Sherwood Gambler,  MD 08/04/16 [phone]

## 2016-08-04 DIAGNOSIS — R0602 Shortness of breath: Secondary | ICD-10-CM | POA: Diagnosis not present

## 2016-08-04 LAB — INFLUENZA PANEL BY PCR (TYPE A & B)
H1N1 flu by pcr: NOT DETECTED
Influenza A By PCR: NEGATIVE
Influenza B By PCR: NEGATIVE

## 2016-08-04 MED ORDER — ONDANSETRON 4 MG PO TBDP: 4.0000 mg | ORAL_TABLET | Freq: Three times a day (TID) | ORAL | 0 refills | Status: DC | PRN

## 2016-08-04 MED ORDER — GUAIFENESIN 100 MG/5ML PO LIQD: 100.0000 mg | ORAL | 0 refills | Status: DC | PRN

## 2016-10-15 ENCOUNTER — Encounter: Payer: Self-pay | Admitting: Vascular Surgery

## 2016-10-28 ENCOUNTER — Ambulatory Visit (INDEPENDENT_AMBULATORY_CARE_PROVIDER_SITE_OTHER): Payer: Medicare Other | Admitting: Vascular Surgery

## 2016-10-28 ENCOUNTER — Ambulatory Visit (INDEPENDENT_AMBULATORY_CARE_PROVIDER_SITE_OTHER)
Admission: RE | Admit: 2016-10-28 | Discharge: 2016-10-28 | Disposition: A | Discharge status: Home / Self Care | Payer: Medicare Other | Source: Ambulatory Visit | Attending: Vascular Surgery | Admitting: Vascular Surgery

## 2016-10-28 ENCOUNTER — Encounter: Payer: Self-pay | Admitting: Vascular Surgery

## 2016-10-28 ENCOUNTER — Ambulatory Visit (HOSPITAL_COMMUNITY)
Admission: RE | Admit: 2016-10-28 | Discharge: 2016-10-28 | Disposition: A | Discharge status: Home / Self Care | Payer: Medicare Other | Source: Ambulatory Visit | Attending: Vascular Surgery | Admitting: Vascular Surgery

## 2016-10-28 VITALS — BP 135/91 | HR 87 | Temp 98.0°F | Resp 20 | Ht 63.0 in | Wt 197.0 lb

## 2016-10-28 DIAGNOSIS — Z48812 Encounter for surgical aftercare following surgery on the circulatory system: Secondary | ICD-10-CM | POA: Diagnosis not present

## 2016-10-28 DIAGNOSIS — I739 Peripheral vascular disease, unspecified: Secondary | ICD-10-CM | POA: Diagnosis present

## 2016-10-28 NOTE — Progress Notes (Signed)
Patient name: Alyssa Odonnell MRN: [id number] DOB: 1933/12/05 Sex: female  REASON FOR VISIT: Follow up of peripheral vascular disease.  HPI: Alyssa Odonnell is a 83 y.o. female who presented with acute onset of discoloration of her toes in the right foot. Look like she had an embolic event. She underwent an arteriogram which showed a focal 90% stenosis of the superficial femoral artery with a filling defect suggesting clot. This was felt to be the source of embolization and I performed angioplasty and stenting of this. I last saw her on 07/22/2016. At that time her ABI was 92% on the right with a biphasic posterior tibial signal and a triphasic dorsalis pedis signal. ABI of the left was 70%. She comes in for 3 month follow up visit.  She states that she has been doing well and denies any claudication, rest pain, or nonhealing ulcers. Her only complaint is her right leg gives out at times because of spinal stenosis. She is considering having injections for her back.  She does not tolerate statins or aspirin. She has been on Plavix because of her stent.  Past Medical History:  Diagnosis Date  . Glaucoma   . Hyperlipidemia   . Hypertension   . Hypothyroid     No family history on file.  SOCIAL HISTORY: Social History  Substance Use Topics  . Smoking status: Never Smoker  . Smokeless tobacco: Never Used  . Alcohol use No    Allergies  Allergen Reactions  . Aspirin     Red splotches  . Atorvastatin Other (See Comments)    Muscle spams  . Mobic [Meloxicam]     rash  . Nabumetone Itching  . Nsaids     Pt does not like to take them. No reaction  . Procaine     Other reaction(s): SWELLING  . Rosuvastatin Nausea Only and Other (See Comments)    Reflux Muscle stiffness  . Simvastatin Nausea Only and Other (See Comments)    Reflux Muscle stiffness  . Penicillins Rash    Rash Has patient had a PCN reaction causing immediate rash, facial/tongue/throat swelling, SOB or  lightheadedness with hypotension No Has patient had a PCN reaction causing severe rash involving mucus membranes or skin necrosis: Yes Has patient had a PCN reaction that required hospitalization No Has patient had a PCN reaction occurring within the last 10 years: No If all of the above answers are "NO", then may proceed with Cephalosporin use.     Current Outpatient Prescriptions  Medication Sig Dispense Refill  . acetaminophen (TYLENOL) 500 MG tablet Take 1,000 mg by mouth daily.    . Cholecalciferol (VITAMIN D3) 5000 units TABS Take 5,000 Units by mouth daily.    . clopidogrel (PLAVIX) 75 MG tablet Take 1 tablet (75 mg total) by mouth daily. 30 tablet 5  . furosemide (LASIX) 40 MG tablet Take 40 mg by mouth daily as needed for edema. (swelling in feet)    . latanoprost (XALATAN) 0.005 % ophthalmic solution Place 1 drop into both eyes at bedtime.    Marland Kitchen levothyroxine (SYNTHROID, LEVOTHROID) 100 MCG tablet Take 100 mcg by mouth daily before breakfast.     . olmesartan-hydrochlorothiazide (BENICAR HCT) 20-12.5 MG tablet Take 1 tablet by mouth daily.    Marland Kitchen omeprazole (PRILOSEC) 20 MG capsule Take 20 mg by mouth daily before breakfast.     . ondansetron (ZOFRAN ODT) 4 MG disintegrating tablet Take 1 tablet (4 mg total) by mouth every 8 (eight) hours  as needed for nausea or vomiting. 10 tablet 0  . sertraline (ZOLOFT) 100 MG tablet Take 150 mg by mouth daily.     No current facility-administered medications for this visit.     REVIEW OF SYSTEMS:  [X]  denotes positive finding, [ ]  denotes negative finding Cardiac  Comments:  Chest pain or chest pressure:    Shortness of breath upon exertion:    Short of breath when lying flat:    Irregular heart rhythm:        Vascular    Pain in calf, thigh, or hip brought on by ambulation:    Pain in feet at night that wakes you up from your sleep:     Blood clot in your veins:    Leg swelling:         Pulmonary    Oxygen at home:    Productive  cough:     Wheezing:         Neurologic    Sudden weakness in arms or legs:     Sudden numbness in arms or legs:     Sudden onset of difficulty speaking or slurred speech:    Temporary loss of vision in one eye:     Problems with dizziness:         Gastrointestinal    Blood in stool:     Vomited blood:         Genitourinary    Burning when urinating:     Blood in urine:        Psychiatric    Major depression:         Hematologic    Bleeding problems:    Problems with blood clotting too easily:        Skin    Rashes or ulcers:        Constitutional    Fever or chills:      PHYSICAL EXAM: Vitals:   10/28/16 1318  BP: (!) 135/91  Pulse: 87  Resp: 20  Temp: 98 F (36.7 C)  TempSrc: Oral  SpO2: 96%  Weight: 197 lb (89.4 kg)  Height: 5\' 3"  (1.6 m)    GENERAL: The patient is a well-nourished female, in no acute distress. The vital signs are documented above. CARDIAC: There is a regular rate and rhythm.  VASCULAR: I do not detect carotid bruits. She has palpable femoral pulses. I cannot palpate pedal pulses. PULMONARY: There is good air exchange bilaterally without wheezing or rales. ABDOMEN: Soft and non-tender with normal pitched bowel sounds.  MUSCULOSKELETAL: There are no major deformities or cyanosis. NEUROLOGIC: No focal weakness or paresthesias are detected. SKIN: There are no ulcers or rashes noted. PSYCHIATRIC: The patient has a normal affect.  DATA:   LOWER EXTREMITY ARTERIAL DOPPLER STUDY: I have independently interpreted her lower extremity arterial Doppler study.  On the right side, there is a monophasic dorsalis pedis and posterior tibial signal. ABI on the right is 72%.  On the left side there is a monophasic dorsalis pedis and posterior tibial signal. ABI is 74%.  RIGHT LOWER EXTREMITY ARTERIAL DUPLEX: I have independently interpreted her right lower arterial duplex. Her right superficial femoral artery is occluded. The distal superficial  femoral artery reconstitutes distally via profunda collaterals.  MEDICAL ISSUES:  PERIPHERAL VASCULAR DISEASE: Her right superficial femoral artery stent is now occluded. She is asymptomatic. I've encouraged her to stay as active as possible and since her stent is occluded and I do not think she needs to stay  on Plavix. I've ordered ABIs in 1 year will have her follow up with our nurse practitioner or PA for continued follow up of her peripheral vascular disease. However currently she is asymptomatic, and given her age I would not recommend any further workup unless she becomes symptomatic in the future.  Deitra Mayo [facility] [phone]

## 2016-10-29 NOTE — Addendum Note (Signed)
Addended by: Lianne Cure A on: 10/29/2016 12:21 PM   Modules accepted: Orders

## 2017-03-09 ENCOUNTER — Inpatient Hospital Stay (HOSPITAL_COMMUNITY)
Admission: EM | Admit: 2017-03-09 | Discharge: 2017-03-11 | DRG: 041 | Disposition: A | Discharge status: Home / Self Care | Payer: Medicare Other | Attending: Family Medicine | Admitting: Family Medicine

## 2017-03-09 ENCOUNTER — Inpatient Hospital Stay (HOSPITAL_COMMUNITY): Payer: Medicare Other

## 2017-03-09 ENCOUNTER — Emergency Department (HOSPITAL_COMMUNITY): Payer: Medicare Other

## 2017-03-09 ENCOUNTER — Encounter (HOSPITAL_COMMUNITY): Payer: Self-pay

## 2017-03-09 DIAGNOSIS — G43909 Migraine, unspecified, not intractable, without status migrainosus: Secondary | ICD-10-CM | POA: Diagnosis present

## 2017-03-09 DIAGNOSIS — I10 Essential (primary) hypertension: Secondary | ICD-10-CM | POA: Diagnosis present

## 2017-03-09 DIAGNOSIS — I638 Other cerebral infarction: Secondary | ICD-10-CM | POA: Diagnosis not present

## 2017-03-09 DIAGNOSIS — E785 Hyperlipidemia, unspecified: Secondary | ICD-10-CM | POA: Diagnosis not present

## 2017-03-09 DIAGNOSIS — E7800 Pure hypercholesterolemia, unspecified: Secondary | ICD-10-CM | POA: Diagnosis not present

## 2017-03-09 DIAGNOSIS — G43109 Migraine with aura, not intractable, without status migrainosus: Secondary | ICD-10-CM | POA: Diagnosis not present

## 2017-03-09 DIAGNOSIS — R29704 NIHSS score 4: Secondary | ICD-10-CM | POA: Diagnosis present

## 2017-03-09 DIAGNOSIS — R2990 Unspecified symptoms and signs involving the nervous system: Secondary | ICD-10-CM | POA: Diagnosis present

## 2017-03-09 DIAGNOSIS — M4800 Spinal stenosis, site unspecified: Secondary | ICD-10-CM | POA: Diagnosis present

## 2017-03-09 DIAGNOSIS — K219 Gastro-esophageal reflux disease without esophagitis: Secondary | ICD-10-CM | POA: Diagnosis present

## 2017-03-09 DIAGNOSIS — E669 Obesity, unspecified: Secondary | ICD-10-CM | POA: Diagnosis present

## 2017-03-09 DIAGNOSIS — L821 Other seborrheic keratosis: Secondary | ICD-10-CM | POA: Diagnosis present

## 2017-03-09 DIAGNOSIS — Z86718 Personal history of other venous thrombosis and embolism: Secondary | ICD-10-CM

## 2017-03-09 DIAGNOSIS — F339 Major depressive disorder, recurrent, unspecified: Secondary | ICD-10-CM | POA: Diagnosis not present

## 2017-03-09 DIAGNOSIS — R011 Cardiac murmur, unspecified: Secondary | ICD-10-CM | POA: Diagnosis present

## 2017-03-09 DIAGNOSIS — K222 Esophageal obstruction: Secondary | ICD-10-CM | POA: Diagnosis present

## 2017-03-09 DIAGNOSIS — I8393 Asymptomatic varicose veins of bilateral lower extremities: Secondary | ICD-10-CM | POA: Diagnosis present

## 2017-03-09 DIAGNOSIS — I63432 Cerebral infarction due to embolism of left posterior cerebral artery: Secondary | ICD-10-CM | POA: Diagnosis present

## 2017-03-09 DIAGNOSIS — F329 Major depressive disorder, single episode, unspecified: Secondary | ICD-10-CM | POA: Diagnosis present

## 2017-03-09 DIAGNOSIS — Z79899 Other long term (current) drug therapy: Secondary | ICD-10-CM

## 2017-03-09 DIAGNOSIS — I739 Peripheral vascular disease, unspecified: Secondary | ICD-10-CM | POA: Diagnosis present

## 2017-03-09 DIAGNOSIS — E039 Hypothyroidism, unspecified: Secondary | ICD-10-CM | POA: Diagnosis present

## 2017-03-09 DIAGNOSIS — H409 Unspecified glaucoma: Secondary | ICD-10-CM | POA: Diagnosis present

## 2017-03-09 DIAGNOSIS — Z6832 Body mass index (BMI) 32.0-32.9, adult: Secondary | ICD-10-CM | POA: Diagnosis not present

## 2017-03-09 DIAGNOSIS — Z886 Allergy status to analgesic agent status: Secondary | ICD-10-CM | POA: Diagnosis not present

## 2017-03-09 DIAGNOSIS — I639 Cerebral infarction, unspecified: Secondary | ICD-10-CM | POA: Diagnosis present

## 2017-03-09 DIAGNOSIS — Z880 Allergy status to penicillin: Secondary | ICD-10-CM | POA: Diagnosis not present

## 2017-03-09 DIAGNOSIS — Z96649 Presence of unspecified artificial hip joint: Secondary | ICD-10-CM | POA: Diagnosis present

## 2017-03-09 DIAGNOSIS — R531 Weakness: Secondary | ICD-10-CM

## 2017-03-09 DIAGNOSIS — I959 Hypotension, unspecified: Secondary | ICD-10-CM | POA: Diagnosis present

## 2017-03-09 DIAGNOSIS — G8191 Hemiplegia, unspecified affecting right dominant side: Secondary | ICD-10-CM | POA: Diagnosis present

## 2017-03-09 DIAGNOSIS — M48061 Spinal stenosis, lumbar region without neurogenic claudication: Secondary | ICD-10-CM

## 2017-03-09 DIAGNOSIS — H53461 Homonymous bilateral field defects, right side: Secondary | ICD-10-CM | POA: Diagnosis present

## 2017-03-09 LAB — COMPREHENSIVE METABOLIC PANEL
ALT: 16 U/L (ref 14–54)
AST: 23 U/L (ref 15–41)
Albumin: 4.3 g/dL (ref 3.5–5.0)
Alkaline Phosphatase: 87 U/L (ref 38–126)
Anion gap: 10 (ref 5–15)
BUN: 35 mg/dL — ABNORMAL HIGH (ref 6–20)
CO2: 22 mmol/L (ref 22–32)
Calcium: 9.8 mg/dL (ref 8.9–10.3)
Chloride: 107 mmol/L (ref 101–111)
Creatinine, Ser: 1.55 mg/dL — ABNORMAL HIGH (ref 0.44–1.00)
GFR calc Af Amer: 35 mL/min — ABNORMAL LOW (ref >60)
GFR calc non Af Amer: 30 mL/min — ABNORMAL LOW (ref >60)
Glucose, Bld: 118 mg/dL — ABNORMAL HIGH (ref 65–99)
Potassium: 4.8 mmol/L (ref 3.5–5.1)
Sodium: 139 mmol/L (ref 135–145)
Total Bilirubin: 0.7 mg/dL (ref 0.3–1.2)
Total Protein: 7.6 g/dL (ref 6.5–8.1)

## 2017-03-09 LAB — I-STAT CHEM 8, ED
BUN: 40 mg/dL — ABNORMAL HIGH (ref 6–20)
Calcium, Ion: 1.15 mmol/L (ref 1.15–1.40)
Chloride: 107 mmol/L (ref 101–111)
Creatinine, Ser: 1.5 mg/dL — ABNORMAL HIGH (ref 0.44–1.00)
Glucose, Bld: 116 mg/dL — ABNORMAL HIGH (ref 65–99)
HCT: 40 % (ref 36.0–46.0)
Hemoglobin: 13.6 g/dL (ref 12.0–15.0)
Potassium: 4.7 mmol/L (ref 3.5–5.1)
Sodium: 139 mmol/L (ref 135–145)
TCO2: 24 mmol/L (ref 0–100)

## 2017-03-09 LAB — CBC
HCT: 41.3 % (ref 36.0–46.0)
Hemoglobin: 13.3 g/dL (ref 12.0–15.0)
MCH: 30.6 pg (ref 26.0–34.0)
MCHC: 32.2 g/dL (ref 30.0–36.0)
MCV: 95.2 fL (ref 78.0–100.0)
Platelets: 212 10*3/uL (ref 150–400)
RBC: 4.34 MIL/uL (ref 3.87–5.11)
RDW: 14.1 % (ref 11.5–15.5)
WBC: 9.4 10*3/uL (ref 4.0–10.5)

## 2017-03-09 LAB — TSH: TSH: 2.151 u[IU]/mL (ref 0.350–4.500)

## 2017-03-09 LAB — DIFFERENTIAL
Basophils Absolute: 0 10*3/uL (ref 0.0–0.1)
Basophils Relative: 0 %
Eosinophils Absolute: 0.5 10*3/uL (ref 0.0–0.7)
Eosinophils Relative: 6 %
Lymphocytes Relative: 23 %
Lymphs Abs: 2.1 10*3/uL (ref 0.7–4.0)
Monocytes Absolute: 0.8 10*3/uL (ref 0.1–1.0)
Monocytes Relative: 9 %
Neutro Abs: 5.9 10*3/uL (ref 1.7–7.7)
Neutrophils Relative %: 62 %

## 2017-03-09 LAB — I-STAT TROPONIN, ED: Troponin i, poc: 0 ng/mL (ref 0.00–0.08)

## 2017-03-09 LAB — LIPID PANEL
Cholesterol: 330 mg/dL — ABNORMAL HIGH (ref 0–200)
Cholesterol: 319 mg/dL — ABNORMAL HIGH (ref 0–200)
HDL: 51 mg/dL (ref >40)
HDL: 45 mg/dL (ref >40)
LDL Cholesterol: 241 mg/dL — ABNORMAL HIGH (ref 0–99)
LDL Cholesterol: 242 mg/dL — ABNORMAL HIGH (ref 0–99)
Total CHOL/HDL Ratio: 6.5 RATIO
Total CHOL/HDL Ratio: 7.1 RATIO
Triglycerides: 188 mg/dL — ABNORMAL HIGH (ref <150)
Triglycerides: 162 mg/dL — ABNORMAL HIGH (ref <150)
VLDL: 38 mg/dL (ref 0–40)
VLDL: 32 mg/dL (ref 0–40)

## 2017-03-09 LAB — APTT: aPTT: 27 seconds (ref 24–36)

## 2017-03-09 LAB — PROTIME-INR
INR: 0.98
Prothrombin Time: 13 seconds (ref 11.4–15.2)

## 2017-03-09 MED ORDER — PANTOPRAZOLE SODIUM 40 MG PO TBEC
40.0000 mg | DELAYED_RELEASE_TABLET | Freq: Every day | ORAL | Status: DC
  Administered 2017-03-10: 40 mg via ORAL
  Filled 2017-03-09: qty 1

## 2017-03-09 MED ORDER — ONDANSETRON HCL 4 MG/2ML IJ SOLN: 4.0000 mg | Freq: Four times a day (QID) | INTRAMUSCULAR | Status: DC | PRN

## 2017-03-09 MED ORDER — SODIUM CHLORIDE 0.9 % IV SOLN
INTRAVENOUS | Status: DC
  Administered 2017-03-09 – 2017-03-10 (×2): via INTRAVENOUS

## 2017-03-09 MED ORDER — CLOPIDOGREL BISULFATE 75 MG PO TABS
75.0000 mg | ORAL_TABLET | Freq: Every day | ORAL | Status: DC
  Administered 2017-03-10: 75 mg via ORAL
  Filled 2017-03-09: qty 1

## 2017-03-09 MED ORDER — STROKE: EARLY STAGES OF RECOVERY BOOK
Freq: Once | Status: AC
  Administered 2017-03-09: 22:00:00

## 2017-03-09 MED ORDER — ACETAMINOPHEN 325 MG PO TABS: 650.0000 mg | ORAL_TABLET | Freq: Four times a day (QID) | ORAL | Status: DC | PRN

## 2017-03-09 MED ORDER — SERTRALINE HCL 50 MG PO TABS
150.0000 mg | ORAL_TABLET | Freq: Every morning | ORAL | Status: DC
  Administered 2017-03-10: 150 mg via ORAL
  Filled 2017-03-09: qty 1

## 2017-03-09 MED ORDER — SODIUM CHLORIDE 0.9 % IV SOLN
INTRAVENOUS | Status: DC
  Administered 2017-03-09: 19:00:00 via INTRAVENOUS

## 2017-03-09 MED ORDER — LEVOTHYROXINE SODIUM 100 MCG PO TABS
100.0000 ug | ORAL_TABLET | Freq: Every day | ORAL | Status: DC
  Administered 2017-03-10: 100 ug via ORAL
  Filled 2017-03-09: qty 1

## 2017-03-09 MED ORDER — DOCUSATE SODIUM 100 MG PO CAPS
100.0000 mg | ORAL_CAPSULE | Freq: Two times a day (BID) | ORAL | Status: DC
  Administered 2017-03-09 – 2017-03-10 (×3): 100 mg via ORAL
  Filled 2017-03-09 (×3): qty 1

## 2017-03-09 MED ORDER — IOPAMIDOL (ISOVUE-370) INJECTION 76%
INTRAVENOUS | Status: AC
  Administered 2017-03-09: 50 mL via INTRAVENOUS
  Filled 2017-03-09: qty 50

## 2017-03-09 MED ORDER — ONDANSETRON HCL 4 MG PO TABS: 4.0000 mg | ORAL_TABLET | Freq: Four times a day (QID) | ORAL | Status: DC | PRN

## 2017-03-09 MED ORDER — HYDRALAZINE HCL 10 MG PO TABS
5.0000 mg | ORAL_TABLET | Freq: Four times a day (QID) | ORAL | Status: DC | PRN
  Filled 2017-03-09: qty 1

## 2017-03-09 MED ORDER — ACETAMINOPHEN 650 MG RE SUPP: 650.0000 mg | Freq: Four times a day (QID) | RECTAL | Status: DC | PRN

## 2017-03-09 MED ORDER — ACETAMINOPHEN 325 MG PO TABS
650.0000 mg | ORAL_TABLET | ORAL | Status: DC | PRN
  Administered 2017-03-10: 650 mg via ORAL
  Filled 2017-03-09: qty 2

## 2017-03-09 MED ORDER — ACETAMINOPHEN 650 MG RE SUPP: 650.0000 mg | RECTAL | Status: DC | PRN

## 2017-03-09 MED ORDER — SODIUM CHLORIDE 0.9% FLUSH
3.0000 mL | Freq: Two times a day (BID) | INTRAVENOUS | Status: DC
  Administered 2017-03-10: 3 mL via INTRAVENOUS

## 2017-03-09 MED ORDER — ACETAMINOPHEN 160 MG/5ML PO SOLN: 650.0000 mg | ORAL | Status: DC | PRN

## 2017-03-09 MED ORDER — GABAPENTIN 300 MG PO CAPS
300.0000 mg | ORAL_CAPSULE | Freq: Every day | ORAL | Status: DC
  Administered 2017-03-09 – 2017-03-10 (×2): 300 mg via ORAL
  Filled 2017-03-09 (×2): qty 1

## 2017-03-09 MED ORDER — CLOPIDOGREL BISULFATE 300 MG PO TABS
300.0000 mg | ORAL_TABLET | Freq: Once | ORAL | Status: AC
  Administered 2017-03-09: 300 mg via ORAL
  Filled 2017-03-09: qty 1

## 2017-03-09 NOTE — Consult Note (Signed)
Stroke Neurology Consultation Note  Consult Requested by: Dr. Rex Kras  Reason for Consult: visual disturbance and left arm numbness  Consult Date: 03/09/17 12pm  The history was obtained from the pt.  During history and examination, all items was able to obtain unless otherwise noted.  History of Present Illness:  Alyssa Odonnell is a 83 y.o. Caucasian female with PMH of HTN, HLD, migraine equivalent, right femoral emboli s/p stent by Dr. Scot Dock off plavix presented as code stroke.  She stated that she has had migraine visual aura for 20-30 years, she did not have HA but has visual disturbance 3-4 times a year and lasting no more than one day. However, for the last 2-3 weeks she started to have visual disturbance again with funny shapes, missing words, etc, but never went away. For the last 1-2 weeks, the visual disturbance constant and stayed with her. Today she went to her garden and felt right arm numbness and right leg mild weakness around 9:30am, she went back in house and had shower but symptoms continue. She also felt slurry speech. EMS called and she was sent her for code stroke. CT showed left PCA infarct likely subacute.  She had right toe discoloration 06/2016 and seen by Dr. Scot Dock, found to have right femoral artery 90% stenosis, felt to be embolic event. She had stent placed. She was put on plavix. On her last follow up with Dr. Scot Dock 10/2016 and she was told that her stent had occluded and she was taken off plavix. She is also allergic to ASA.   She was on statin for HLD but she can not tolerate it due to joint pain and edema. She takes benica for HTN but her BP never high as per pt.   LSN: 2-3 weeks ago tPA Given: No: out of window and CT showed subacute left PCA infarct.  Past Medical History:  Diagnosis Date  . Glaucoma   . Hyperlipidemia   . Hypertension   . Hypothyroid     Past Surgical History:  Procedure Laterality Date  . ABDOMINAL HERNIA REPAIR    . PERIPHERAL  VASCULAR CATHETERIZATION Right 07/06/2016   Procedure: Lower Extremity Angiography;  Surgeon: Angelia Mould, MD;  Location: [facility];  Service: Cardiovascular;  Laterality: Right;  . PERIPHERAL VASCULAR CATHETERIZATION Right 07/06/2016   Procedure: Peripheral Vascular Intervention;  Surgeon: Angelia Mould, MD;  Location: [facility];  Service: Cardiovascular;  Laterality: Right;  Superficial femoral  . TOTAL HIP ARTHROPLASTY      No family history on file.  Social History:  reports that she has never smoked. She has never used smokeless tobacco. She reports that she does not drink alcohol. Her drug history is not on file.  Allergies:  Allergies  Allergen Reactions  . Aspirin     Red splotches  . Atorvastatin Other (See Comments)    Muscle spams  . Mobic [Meloxicam]     rash  . Nabumetone Itching  . Nsaids     Pt does not like to take them. No reaction  . Procaine     Other reaction(s): SWELLING  . Rosuvastatin Nausea Only and Other (See Comments)    Reflux Muscle stiffness  . Simvastatin Nausea Only and Other (See Comments)    Reflux Muscle stiffness  . Penicillins Rash    Rash Has patient had a PCN reaction causing immediate rash, facial/tongue/throat swelling, SOB or lightheadedness with hypotension No Has patient had a PCN reaction causing severe  rash involving mucus membranes or skin necrosis: Yes Has patient had a PCN reaction that required hospitalization No Has patient had a PCN reaction occurring within the last 10 years: No If all of the above answers are "NO", then may proceed with Cephalosporin use.     No current facility-administered medications on file prior to encounter.    Current Outpatient Prescriptions on File Prior to Encounter  Medication Sig Dispense Refill  . acetaminophen (TYLENOL) 500 MG tablet Take 1,000 mg by mouth daily.    . Cholecalciferol (VITAMIN D3) 5000 units TABS Take 5,000 Units by mouth daily.    .  clopidogrel (PLAVIX) 75 MG tablet Take 1 tablet (75 mg total) by mouth daily. 30 tablet 5  . furosemide (LASIX) 40 MG tablet Take 40 mg by mouth daily as needed for edema. (swelling in feet)    . latanoprost (XALATAN) 0.005 % ophthalmic solution Place 1 drop into both eyes at bedtime.    Marland Kitchen levothyroxine (SYNTHROID, LEVOTHROID) 100 MCG tablet Take 100 mcg by mouth daily before breakfast.     . olmesartan-hydrochlorothiazide (BENICAR HCT) 20-12.5 MG tablet Take 1 tablet by mouth daily.    Marland Kitchen omeprazole (PRILOSEC) 20 MG capsule Take 20 mg by mouth daily before breakfast.     . ondansetron (ZOFRAN ODT) 4 MG disintegrating tablet Take 1 tablet (4 mg total) by mouth every 8 (eight) hours as needed for nausea or vomiting. 10 tablet 0  . sertraline (ZOLOFT) 100 MG tablet Take 150 mg by mouth daily.      Review of Systems: A full ROS was attempted today and was able to be performed.  Systems assessed include - Constitutional, Eyes,  HENT, Respiratory, Cardiovascular, Gastrointestinal, Genitourinary, Integument/breast, Hematologic/lymphatic, Musculoskeletal, Neurological, Behavioral/Psych, Endocrine,  Allergic/Immunologic - with pertinent responses as per HPI.  Physical Examination: Temp:  [97.9 F (36.6 C)] 97.9 F (36.6 C) (05/22 1244) Pulse Rate:  [77] 77 (05/22 1244) Resp:  [15] 15 (05/22 1244) BP: (115)/(57) 115/57 (05/22 1244) SpO2:  [99 %] 99 % (05/22 1244) Weight:  [182 lb 15.7 oz (83 kg)] 182 lb 15.7 oz (83 kg) (05/22 1242)  General - The patient's general appearance was well nourished, well developed, in no apparent distress.    Ophthalmologic - fundi not able to see due to noncooperation.    Cardiovascular - Ausculation of carotids revealed regular rate and rhythm.  Mental Status -  Level of arousal and orientation to time, place, and person were intact. Language including expression, naming, repetition, comprehension, reading, and writing was assessed and found intact. Fund of  Knowledge was assessed and was intact.  Cranial Nerves II - XII - II - right homonymous hemianopia. III, IV, VI - Extraocular movements intact. V - Facial sensation intact bilaterally. VII - mild left nasolabial fold flattening. VIII - Hearing & vestibular intact bilaterally. X - Palate elevates symmetrically, mild dysarthria. XI - Chin turning & shoulder shrug intact bilaterally. XII - Tongue protrusion intact.  Motor Strength - The patient's strength was normal in all extremities except mild right hand dexterity difficulty and pronator drift was absent.   Motor Tone & Bulk - Muscle tone was assessed at the neck and appendages and was normal.  Bulk was normal and fasciculations were absent.   Reflexes - The patient's reflexes were normal in all extremities and she had no pathological reflexes.  Sensory - Light touch, temperature/pinprick were assessed and were normal.    Coordination - The patient had normal movements in the hands  with no ataxia or dysmetria.  Tremor was absent.  Gait and Station - deferred  NIH Stroke Scale  Level Of Consciousness 0=Alert; keenly responsive 1=Not alert, but arousable by minor stimulation 2=Not alert, requires repeated stimulation 3=Responds only with reflex movements 0  LOC Questions to Month and Age 37=Answers both questions correctly 1=Answers one question correctly 2=Answers neither question correctly 0  LOC Commands      -Open/Close eyes     -Open/close grip 0=Performs both tasks correctly 1=Performs one task correctly 2=Performs neighter task correctly 0  Best Gaze 0=Normal 1=Partial gaze palsy 2=Forced deviation, or total gaze paresis 0  Visual 0=No visual loss 1=Partial hemianopia 2=Complete hemianopia 3=Bilateral hemianopia (blind including cortical blindness) 2  Facial Palsy 0=Normal symmetrical movement 1=Minor paralysis (asymmetry) 2=Partial paralysis (lower face) 3=Complete paralysis (upper and lower face) 1  Motor  0=No  drift, limb holds posture for full 10 seconds 1=Drift, limb holds posture, no drift to bed 2=Some antigravity effort, cannot maintain posture, drifts to bed 3=No effort against gravity, limb falls 4=No movement Right Arm 0     Leg 0    Left Arm 0     Leg 0  Limb Ataxia 0=Absent 1=Present in one limb 2=Present in two limbs 0  Sensory 0=Normal 1=Mild to moderate sensory loss 2=Severe to total sensory loss 0  Best Language 0=No aphasia, normal 1=Mild to moderate aphasia 2=Mute, global aphasia 3=Mute, global aphasia 0  Dysarthria 0=Normal 1=Mild to moderate 2=Severe, unintelligible or mute/anarthric 1  Extinction/Neglect 0=No abnormality 1=Extinction to bilateral simultaneous stimulation 2=Profound neglect 0  Total   4     Data Reviewed: I have personally reviewed the radiological images below and agree with the radiology interpretations.  Ct Head Code Stroke W/o Cm 03/09/2017 IMPRESSION: 1. Moderate infarct in the left occipital lobe correlating with history of subacute visual symptoms. No hemorrhagic conversion. 2. No visible MCA or ACA cortical infarct in this patient with weakness. 3. Moderate chronic small vessel ischemia.    Assessment: 83 y.o. female with PMH of HTN, HLD, migraine equivalent, right femoral emboli s/p stent by Dr. Scot Dock off plavix presented as code stroke. She apparently had visual disturbance for 2-3 weeks but though was her migraine equivalent. Today had right arm numbness and right leg mild weakness. On exam, NIHSS = 4 including right hemianopia. CT showed left PCA subcortical infarct. Combining her emoblic right femoral emboli in 06/2016 and current event, highly suspicious for recurrent cardioembolic event. She needs stroke work up likely include TEE and loop.  Stroke Risk Factors - HTN, HLD, hx of right femoral emboli, migraine aura  Plan: - HgbA1c, fasting lipid panel - MR of the brain without contrast - CTA head and neck - LE venous doppler to  rule out DVT - load with plavix 300mg  and then 75mg  daily  - will consider TEE and loop based on above work up - PT consult, OT consult, Speech consult - Permissive hypertension (OK if <180/105) for 24-48 hours post stroke and then gradually normalized within 5-7 days. - Echocardiogram - Prophylactic therapy-Antiplatelet med: Plavix  - Risk factor modification - Telemetry monitoring - Frequent neuro checks  Thank you for this consultation and allowing [place] to participate in the care of this patient.  Rosalin Hawking, MD PhD Stroke Neurology 03/09/2017 1:19 PM  This patient is critically ill due to code stroke and likely embolic stroke and at significant risk of neurological worsening, death form recurrent stroke, hemorrhagic transformation. This patient's care requires constant monitoring  of vital signs, hemodynamics, respiratory and cardiac monitoring, review of multiple databases, neurological assessment, discussion with family, other specialists and medical decision making of high complexity. I spent 45 minutes of neurocritical care time in the care of this patient.

## 2017-03-09 NOTE — Progress Notes (Signed)
Re: carotid duplex. CTA neck 03/09/17 was performed and is Gold standard.  Please advise if carotid duplex still needed.  Vascular lab [place], [place], [place] 03/09/2017

## 2017-03-09 NOTE — ED Notes (Signed)
Patient transported to MRI 

## 2017-03-09 NOTE — ED Provider Notes (Signed)
[facility] DEPT Provider Note   CSN: [id number] Arrival date & time: 03/09/17  1221     History   Chief Complaint Chief Complaint  Patient presents with  . Code Stroke    R sided weakness that started today, but having visual issues for the past few weeks     HPI MARGURETTE Odonnell is a 83 y.o. female.  The history is provided by the patient and medical records.    83 year old female with history of glaucoma, hyperlipidemia, hypertension, hypothyroidism presenting to the ED as a code stroke.  Patient reports she has been having some visual problems in her right eye for several weeks now. Reports she felt that this was due to migraine related symptoms, however was not having any headache. She did not seek evaluation for this. States today while working in her garden she had some numbness and tingling in her right arm as well as some weakness in her right leg. States she was still able to walk, but felt that her right leg may "give way". Reports daughter came over and became concerned so called EMS. Patient has no prior history of TIA or stroke.  Past Medical History:  Diagnosis Date  . Glaucoma   . Hyperlipidemia   . Hypertension   . Hypothyroid     There are no active problems to display for this patient.   Past Surgical History:  Procedure Laterality Date  . ABDOMINAL HERNIA REPAIR    . PERIPHERAL VASCULAR CATHETERIZATION Right 07/06/2016   Procedure: Lower Extremity Angiography;  Surgeon: Angelia Mould, MD;  Location: [facility];  Service: Cardiovascular;  Laterality: Right;  . PERIPHERAL VASCULAR CATHETERIZATION Right 07/06/2016   Procedure: Peripheral Vascular Intervention;  Surgeon: Angelia Mould, MD;  Location: [facility];  Service: Cardiovascular;  Laterality: Right;  Superficial femoral  . TOTAL HIP ARTHROPLASTY      OB History    No data available       Home Medications    Prior to Admission medications   Medication Sig Start  Date End Date Taking? Authorizing Provider  acetaminophen (TYLENOL) 500 MG tablet Take 1,000 mg by mouth daily.    [provider]  Cholecalciferol (VITAMIN D3) 5000 units TABS Take 5,000 Units by mouth daily.    [provider]  clopidogrel (PLAVIX) 75 MG tablet Take 1 tablet (75 mg total) by mouth daily. 07/22/16   Angelia Mould, MD  furosemide (LASIX) 40 MG tablet Take 40 mg by mouth daily as needed for edema. (swelling in feet) 05/11/16   [provider]  latanoprost (XALATAN) 0.005 % ophthalmic solution Place 1 drop into both eyes at bedtime. 05/11/16   [provider]  levothyroxine (SYNTHROID, LEVOTHROID) 100 MCG tablet Take 100 mcg by mouth daily before breakfast.  05/11/16   [provider]  olmesartan-hydrochlorothiazide (BENICAR HCT) 20-12.5 MG tablet Take 1 tablet by mouth daily.    [provider]  omeprazole (PRILOSEC) 20 MG capsule Take 20 mg by mouth daily before breakfast.  05/11/16   [provider]  ondansetron (ZOFRAN ODT) 4 MG disintegrating tablet Take 1 tablet (4 mg total) by mouth every 8 (eight) hours as needed for nausea or vomiting. 08/04/16   Sherwood Gambler, MD  sertraline (ZOLOFT) 100 MG tablet Take 150 mg by mouth daily. 05/11/16   [provider]    Family History No family history on file.  Social History Social History  Substance Use Topics  . Smoking  status: Never Smoker  . Smokeless tobacco: Never Used  . Alcohol use No     Allergies   Aspirin; Atorvastatin; Mobic [meloxicam]; Nabumetone; Nsaids; Procaine; Rosuvastatin; Simvastatin; and Penicillins   Review of Systems Review of Systems  Neurological: Positive for weakness.  All other systems reviewed and are negative.    Physical Exam Updated Vital Signs Ht 5\' 3"  (1.6 m)   Wt 83 kg (182 lb 15.7 oz)   BMI 32.41 kg/m   Physical Exam  Constitutional: She is oriented to person, place, and time. She appears  well-developed and well-nourished.  HENT:  Head: Normocephalic and atraumatic.  Mouth/Throat: Oropharynx is clear and moist.  Eyes: Conjunctivae and EOM are normal. Pupils are equal, round, and reactive to light.  Right field cut noted  Neck: Normal range of motion.  Cardiovascular: Normal rate, regular rhythm and normal heart sounds.   Pulmonary/Chest: Effort normal and breath sounds normal. No respiratory distress. She has no wheezes.  Abdominal: Soft. Bowel sounds are normal.  Musculoskeletal: Normal range of motion.  Neurological: She is alert and oriented to person, place, and time.  AAOx3, answering questions and following commands appropriately; mildly decreased strength of the right leg compared with left; normal strength of both arms; CN grossly intact; normal finger to nose and heel to shin bilaterally, somewhat slower on right but no true ataxia; speech is clear; reports decreased sensation to left leg compared with right, normal sensation elsewhere  Skin: Skin is warm and dry.  Psychiatric: She has a normal mood and affect.  Nursing note and vitals reviewed.    ED Treatments / Results  Labs (all labs ordered are listed, but only abnormal results are displayed) Labs Reviewed  COMPREHENSIVE METABOLIC PANEL - Abnormal; Notable for the following:       Result Value   Glucose, Bld 118 (*)    BUN 35 (*)    Creatinine, Ser 1.55 (*)    GFR calc non Af Amer 30 (*)    GFR calc Af Amer 35 (*)    All other components within normal limits  I-STAT CHEM 8, ED - Abnormal; Notable for the following:    BUN 40 (*)    Creatinine, Ser 1.50 (*)    Glucose, Bld 116 (*)    All other components within normal limits  PROTIME-INR  APTT  CBC  DIFFERENTIAL  HEMOGLOBIN A1C  LIPID PANEL  I-STAT TROPOININ, ED  CBG MONITORING, ED    EKG  EKG Interpretation None       Radiology Ct Angio Head W Or Wo Contrast  Result Date: 03/09/2017 CLINICAL DATA:  Left PCA infarct. Visual changes  and left arm weakness. EXAM: CT ANGIOGRAPHY HEAD AND NECK TECHNIQUE: Multidetector CT imaging of the head and neck was performed using the standard protocol during bolus administration of intravenous contrast. Multiplanar CT image reconstructions and MIPs were obtained to evaluate the vascular anatomy. Carotid stenosis measurements (when applicable) are obtained utilizing NASCET criteria, using the distal internal carotid diameter as the denominator. CONTRAST:  50 cc Isovue 370 intravenous COMPARISON:  Head CT from earlier today FINDINGS: CTA NECK FINDINGS Aortic arch: Atherosclerosis that is mild.  Three vessel branching. Right carotid system: Moderate calcified plaque at the common carotid bifurcation and ICA bulb without stenosis, ulceration, or dissection. Left carotid system: Moderate predominately noncalcified plaque at the common carotid bifurcation and ICA bulb without flow limiting stenosis, ulceration, or dissection. Vertebral arteries: No brachiocephalic or proximal subclavian flow limiting stenosis. Left dominant vertebral  artery. Both vessels are smooth and widely patent to the dura. Skeleton: Degenerative changes in the cervical spine. No acute or aggressive finding. Other neck: 9 mm left thyroid mass or cyst, considered incidental based on size and demographics. Upper chest: Right base density on scout image is from intrathoracic stomach. Review of the MIP images confirms the above findings CTA HEAD FINDINGS Anterior circulation: Confluent atherosclerotic plaque on the carotid siphons. No branch occlusion or flow limiting stenosis. Negative for aneurysm. Posterior circulation: Mild left vertebral artery dominance. Moderate atheromatous narrowing of the left vertebral artery due to noncalcified plaque just past the dura. The basilar is smooth and widely patent. Dysplastic distal basilar without true saccular aneurysm. Proximal left P2 segment occlusion with intermittent faint downstream reconstitution.  Fetal type PCA on the right. Venous sinuses: Patent. Anatomic variants: None other than described above. Delayed phase: No abnormal intracranial enhancement. The left PCA infarct is nonenhancing. Review of the MIP images confirms the above findings IMPRESSION: 1. Left proximal P2 occlusion with weak downstream reconstitution. 2. Moderate atheromatous narrowing at the left V4 segment. 3. Atherosclerosis without flow limiting stenosis in the anterior circulation. Electronically Signed   By: Monte Fantasia M.D.   On: 03/09/2017 14:20   Ct Angio Neck W Or Wo Contrast  Result Date: 03/09/2017 CLINICAL DATA:  Left PCA infarct. Visual changes and left arm weakness. EXAM: CT ANGIOGRAPHY HEAD AND NECK TECHNIQUE: Multidetector CT imaging of the head and neck was performed using the standard protocol during bolus administration of intravenous contrast. Multiplanar CT image reconstructions and MIPs were obtained to evaluate the vascular anatomy. Carotid stenosis measurements (when applicable) are obtained utilizing NASCET criteria, using the distal internal carotid diameter as the denominator. CONTRAST:  50 cc Isovue 370 intravenous COMPARISON:  Head CT from earlier today FINDINGS: CTA NECK FINDINGS Aortic arch: Atherosclerosis that is mild.  Three vessel branching. Right carotid system: Moderate calcified plaque at the common carotid bifurcation and ICA bulb without stenosis, ulceration, or dissection. Left carotid system: Moderate predominately noncalcified plaque at the common carotid bifurcation and ICA bulb without flow limiting stenosis, ulceration, or dissection. Vertebral arteries: No brachiocephalic or proximal subclavian flow limiting stenosis. Left dominant vertebral artery. Both vessels are smooth and widely patent to the dura. Skeleton: Degenerative changes in the cervical spine. No acute or aggressive finding. Other neck: 9 mm left thyroid mass or cyst, considered incidental based on size and demographics.  Upper chest: Right base density on scout image is from intrathoracic stomach. Review of the MIP images confirms the above findings CTA HEAD FINDINGS Anterior circulation: Confluent atherosclerotic plaque on the carotid siphons. No branch occlusion or flow limiting stenosis. Negative for aneurysm. Posterior circulation: Mild left vertebral artery dominance. Moderate atheromatous narrowing of the left vertebral artery due to noncalcified plaque just past the dura. The basilar is smooth and widely patent. Dysplastic distal basilar without true saccular aneurysm. Proximal left P2 segment occlusion with intermittent faint downstream reconstitution. Fetal type PCA on the right. Venous sinuses: Patent. Anatomic variants: None other than described above. Delayed phase: No abnormal intracranial enhancement. The left PCA infarct is nonenhancing. Review of the MIP images confirms the above findings IMPRESSION: 1. Left proximal P2 occlusion with weak downstream reconstitution. 2. Moderate atheromatous narrowing at the left V4 segment. 3. Atherosclerosis without flow limiting stenosis in the anterior circulation. Electronically Signed   By: Monte Fantasia M.D.   On: 03/09/2017 14:20   Ct Head Code Stroke W/o Cm  Result Date: 03/09/2017 CLINICAL DATA:  Code stroke.  Right-sided weakness EXAM: CT HEAD WITHOUT CONTRAST TECHNIQUE: Contiguous axial images were obtained from the base of the skull through the vertex without intravenous contrast. COMPARISON:  None. FINDINGS: Brain: Moderate area of cytotoxic type edema in the parasagittal left occipital lobe, likely subacute. Patient has had visual disturbance for a few days. There is patchy chronic microvascular ischemic change in the cerebral white matter with remote lacunar infarct in the right upper putamen. No cortical infarct seen in the ACA or MCA distributions. No hemorrhage, hydrocephalus, or mass. Vascular: Atherosclerotic calcification.  No hyperdense vessel. Skull: No  acute or aggressive finding Sinuses/Orbits: Negative Other: These results were called by telephone at the time of interpretation on 03/09/2017 at 12:41 pm to Dr. Erlinda Hong, who verbally acknowledged these results. ASPECTS [facility] Stroke Program Early CT Score) Not scored with the above findings. IMPRESSION: 1. Moderate infarct in the left occipital lobe correlating with history of subacute visual symptoms. No hemorrhagic conversion. 2. No visible MCA or ACA cortical infarct in this patient with weakness. 3. Moderate chronic small vessel ischemia. Electronically Signed   By: Monte Fantasia M.D.   On: 03/09/2017 12:44    Procedures Procedures (including critical care time)  Medications Ordered in ED Medications - No data to display   Initial Impression / Assessment and Plan / ED Course  I have reviewed the triage vital signs and the nursing notes.  Pertinent labs & imaging results that were available during my care of the patient were reviewed by me and considered in my medical decision making (see chart for details).  83 year old female here with some right-sided visual symptoms for the past few weeks, new right arm numbness and right leg weakness while working in the garden today.  On exam here she has some mild right leg weakness compared to the left and decreased sensation of the right lower leg. She's not had any significant right arm weakness. Her cerebellar testing does not show any true ataxia, but movements are slowed on the right compared with left. She does have a right field cut.  CT scan revealing a subacute left occipital stroke which likely accounts for her visual issues she has been experiencing.   NIH scale of 4, not tpa candidate per neurology.  Will admit to medicine service for stroke work-up.  Final Clinical Impressions(s) / ED Diagnoses   Final diagnoses:  Cerebrovascular accident (CVA), unspecified mechanism [facility])    New Prescriptions New Prescriptions   No medications on file      Larene Pickett, PA-C 03/09/17 [place], Wenda Overland, MD 03/10/17 [phone]

## 2017-03-09 NOTE — ED Triage Notes (Signed)
Pt arrives as a "code Stroke" pt having R sided weakness that was seen by family today, pt arrives also stating that she has been having changes to the R side for the past few weeks

## 2017-03-09 NOTE — ED Notes (Signed)
Attempted to call report. RN to call back. 

## 2017-03-09 NOTE — ED Notes (Signed)
Pt in room placed on monitor in no distress Neuro MD at bedside with Neuro team for eval pt awake and alert and responding appropriately

## 2017-03-09 NOTE — H&P (Signed)
[facility] Admission History and Physical Service Pager: [phone]  Patient name: Alyssa Odonnell Medical record number: [id number] Date of birth: 24-Jun-1934 Age: 83 y.o. Gender: female  Primary Care Provider: Drake Leach, MD Consultants: Neurology  Code Status: FULL    Chief Complaint: R sided weakness, visual disturbance   Assessment and Plan: Alyssa Odonnell is a 83 y.o. female presenting with right sided weakness. PMH is significant for HTN, PAD, Hypothyroidism, HLD, GERD, depression, right femoral emboli s/p stent by Dr. Scot Dock (off Plavix), and spinal stenosis with disc herniation.    R sided vision changes and weakness.  On arrival to ED presenting as code stroke with VSS.  With history of 3-4 weeks of painless but worsening visual disturbances that appear migraine aura-like.  Also with 1 day of new onset weakness, numbness and tingling in right arm and right leg most of which had resolved by time of my exam. Neuro exam significant for right hemianopsia. Experienced some difficulty walking due to weakness and was brought in by EMS.  With no prior history of stroke.  Labs notable for SCr of 1.55 and LDL 241 but otherwise within normal limits.  CBC/APTT and INR normal.  istat troponin negative.  Of note, patient is allergic to aspirin.  CT Head with moderate infarct in the left occipital lobe correlating with history of subacute visual symptoms (right hemianopsia). No hemorrhagic conversion. Moderate chronic small vessel ischemia.  CT Angio of head and neck revealed left proximal occlusion and atherosclerosis without flow limiting stenosis in anterior circulation.  Neurology consulted in ED.  NIH score of 4 and not a tPA candidate per Neurology. She was loaded with Plavix 300 mg in ED to be followed by 75 mg daily.  Given history of right femoral emboli s/p stent, known risk factors, and current symptoms, concern for recurrent cardioembolic event.   -Admit to [facility]PTS  for stroke workup, attending Dr. Ree Kida -Telemetry -Appreciate neuro recs   -MRI brain without contrast  -CTA head and neck  -Echo  -Will consider TEE and loop based on the following workup    -LE doppler for DVT  -Permissive hypertension (SBP <180 or DBP <105 for 24-48 h) then gradually normalize over 5-7 days  -Plavix 75 mg daily. Was loaded with 300 mg in ED. Allergic to aspirin. -Start Atorvastatin -SLP/OT/PT consult -SCD's -Risk stratification labs ordered: TSH, lipid panel, A1c -Neuro checks Q2 x12 hours then space to Q4 -Continuous pulse ox -Fall precautions -vitals per unit routine   HLD.  Lipid panel with LDL 241, HDL 51, TG 188, VLDL 38, total chol 330  Patient reports she is not on a statin medication.  Has tried this in the past and experienced terrible muscle pain and soreness.  -Would recommend statin therapy if patient is agreeable -We will start atorvastatin   PAD.  Reports bilateral leg pain x 1 month.  One exam bilateral legs warm and slightly erythematous with significant varicose veins. Difficult to assess for calf tenderness secondary as some of her pain may be attributed to varicosities.  Patient with no history of recent immobilization or long distance travel however will order LE dopplers to rule out clot.  -LE dopplers pending  -SCDs for now in setting of possible stroke   H/o R femoral artery emboli s/p stent.  Patient with R toe discoloration in 06/2016 and seen by Dr. Scot Dock.  Found to have right femoral artery 90% stenosis which appeared to be embolic event. She had  a stent placed at that time and was put on Plavix.  On her last follow up with Dr. Scot Dock 10/2016 she was told that her stent had occluded and she was taken off plavix. She is also allergic to ASA.  -Plavix as above  Hypothyroidism.  Recent TSH unknown.  At home on Synthroid 100 mcg daily.   -Continue home Synthroid -TSH   HTN.  On admission, blood pressure 113/70.  At home on Benicar HCT  20-12.5 mg daily.   -Hold antihypertensives in setting of possible stroke -Will allow permissive HTN over next several days  -Hydralazine 5 mg with parameters ordered (SBP>180 or DBP>105) -Monitor blood pressures  GERD.  At home on Omeprazole 20 mg daily.  -Protonix 40 mg daily   Spinal stenosis with ?bulging disc.  Patient with history of lumbar radiculopathy.  Follows with [facility] for physical therapy.  Reports this is helping and additionally, she takes 2 tabs Tylenol in AM for discomfort.  -Can continue Tylenol as needed for back pain   Depression.  Stable. Reports she has been on Zoloft for the past 10-12 years.    -Continue Zoloft 150 mg daily   FEN/GI: NPO pending SLP, then Heart healthy diet, Protonix, SLIV   Prophylaxis: SCDs pending MRI    Disposition: Will admit to MedSurg (neuro bed) for evaluation and management of stroke  History of Present Illness:  Alyssa Odonnell is a 83 y.o. female presenting with right sided weakness.   Notes bad visual disturbances x 3-4 weeks.  Describes symptoms as the visual disturbances of a migraine without the headache component.  Notes she used to have these episodes on and off in the past.  About 4-5 times a year.  However over the past 2-3 weeks they are occurring more frequently and she experiences about 3-4 of these episodes a day.  Last about 20 minutes and goes away on its own.  She does not take anything for them since they are not painful. She did not previously see a doctor for this because she thought they were migraine-related.  Describes the disturbances as colored lights, shiny formations with some blurry vision.  No falls, LOC or dizziness.  No auditory symptoms.  Denies ringing in ears.   She notes yesterday she was on her way to garden to pull up tomato plants and had a hard time getting back to her house.  Had generalized weakness but notes more in the R leg than L.  This is unusual for her as yesterday she was outside gardening for ~4-5  hours.  She went inside the house to lay down for a while.  Decided to take a shower but felt weak on R side and called EMS to be brought into hospital.  Has been eating and drinking normally. Has been voiding normally.  BMs are regular.   Currently is still having the visual disturbance but notes it has improved since getting to the ED.   Has never had a stroke before.  No history of clots in leg or lung.  No recent immobilization.  Does not check her blood pressures at home much but notes they are not usually high.   300 plavix 75 plavix    Review Of Systems: Per HPI with the following additions:   Review of Systems  Constitutional: Negative for chills and fever.  HENT: Negative for congestion, hearing loss and tinnitus.   Eyes: Positive for blurred vision. Negative for double vision, pain, discharge and redness.  Respiratory:  Negative for cough and sputum production.   Cardiovascular: Positive for claudication and PND. Negative for chest pain and leg swelling.  Gastrointestinal: Negative for abdominal pain, constipation, diarrhea, nausea and vomiting.  Genitourinary: Negative for dysuria.  Skin: Negative for rash.  Neurological: Positive for weakness.   Patient Active Problem List   Diagnosis Date Noted  . CVA (cerebral vascular accident) ([place]) 03/09/2017  . htn 03/09/2017  . Weakness 03/09/2017    Past Medical History: Past Medical History:  Diagnosis Date  . Glaucoma   . Hyperlipidemia   . Hypertension   . Hypothyroid    Past Surgical History: Past Surgical History:  Procedure Laterality Date  . ABDOMINAL HERNIA REPAIR    . PERIPHERAL VASCULAR CATHETERIZATION Right 07/06/2016   Procedure: Lower Extremity Angiography;  Surgeon: Angelia Mould, MD;  Location: [place] [facility];  Service: Cardiovascular;  Laterality: Right;  . PERIPHERAL VASCULAR CATHETERIZATION Right 07/06/2016   Procedure: Peripheral Vascular Intervention;  Surgeon: Angelia Mould, MD;   Location: [place] [facility];  Service: Cardiovascular;  Laterality: Right;  Superficial femoral  . TOTAL HIP ARTHROPLASTY     Social History: Social History  Substance Use Topics  . Smoking status: Never Smoker  . Smokeless tobacco: Never Used  . Alcohol use No   Never smoker.  No history of drug or alcohol use. Lives at home with sister and daughter.  Used to live alone but sister moved in with her 6 months ago.   Please also refer to relevant sections of EMR.  Family History: No family history on file.  Allergies and Medications: Allergies  Allergen Reactions  . Aspirin Other (See Comments)    Red splotches  . Atorvastatin Other (See Comments)    Muscle spams  . Nabumetone Itching  . Nsaids     Reaction (??)  . Procaine Swelling  . Rosuvastatin Nausea Only and Other (See Comments)    Reflux and Muscle stiffness  . Simvastatin Nausea Only and Other (See Comments)    Reflux and Muscle stiffness  . Mobic [Meloxicam] Rash  . Penicillins Rash    Has patient had a PCN reaction causing immediate rash, facial/tongue/throat swelling, SOB or lightheadedness with hypotension: Yes Has patient had a PCN reaction causing severe rash involving mucus membranes or skin necrosis: No Has patient had a PCN reaction that required hospitalization: No Has patient had a PCN reaction occurring within the last 10 years: No If all of the above answers are "NO", then may proceed with Cephalosporin use.     No current facility-administered medications on file prior to encounter.    Current Outpatient Prescriptions on File Prior to Encounter  Medication Sig Dispense Refill  . acetaminophen (TYLENOL) 500 MG tablet Take 1,000 mg by mouth every morning.     . latanoprost (XALATAN) 0.005 % ophthalmic solution Place 1 drop into both eyes at bedtime.    Marland Kitchen levothyroxine (SYNTHROID, LEVOTHROID) 100 MCG tablet Take 100 mcg by mouth daily before breakfast.     . olmesartan-hydrochlorothiazide (BENICAR  HCT) 20-12.5 MG tablet Take 1 tablet by mouth daily.    . clopidogrel (PLAVIX) 75 MG tablet Take 1 tablet (75 mg total) by mouth daily. (Patient not taking: Reported on 03/09/2017) 30 tablet 5  . furosemide (LASIX) 40 MG tablet Take 40 mg by mouth daily as needed for edema. (swelling in feet)    . ondansetron (ZOFRAN ODT) 4 MG disintegrating tablet Take 1 tablet (4 mg total) by mouth every 8 (eight)  hours as needed for nausea or vomiting. (Patient not taking: Reported on 03/09/2017) 10 tablet 0   Objective: BP 116/60   Pulse 84   Temp 97.9 F (36.6 C) (Oral)   Resp (!) 22   Ht 5\' 3"  (1.6 m)   Wt 182 lb 15.7 oz (83 kg)   SpO2 96%   BMI 32.41 kg/m  Exam: General: pleasant 83 yo F, NAD Eyes: EOMI, PERRL, R hemianopia  ENTM: MMM, o/p clear Neck: supple, no JVD Cardiovascular: RRR no MRG, 2/6 SEM over RUSB and LUSB, no carotid bruits, Palpable pulses Respiratory: CTAB, comfortable work of breathing on RA  Gastrointestinal: soft, NTND, +bs MSK: ROM normal Derm: skin is warm and dry, seborrheic keratosis noted over chest and back  Neuro: Awake, AOx4, CN II-XII intact, has right-sided hemianopsia  No facial droop, speech is normal, 5/5 strength bilaterally in upper and lower extremities, sensation intact,  normal finger-to-nose  Psych: normal affect   Labs and Imaging: CBC BMET   Recent Labs Lab 03/09/17 1215 03/09/17 1230  WBC 9.4  --   HGB 13.3 13.6  HCT 41.3 40.0  PLT 212  --     Recent Labs Lab 03/09/17 1215 03/09/17 1230  NA 139 139  K 4.8 4.7  CL 107 107  CO2 22  --   BUN 35* 40*  CREATININE 1.55* 1.50*  GLUCOSE 118* 116*  CALCIUM 9.8  --      Ct Angio Head W Or Wo Contrast  Result Date: 03/09/2017 CLINICAL DATA:  Left PCA infarct. Visual changes and left arm weakness. EXAM: CT ANGIOGRAPHY HEAD AND NECK TECHNIQUE: Multidetector CT imaging of the head and neck was performed using the standard protocol during bolus administration of intravenous contrast. Multiplanar  CT image reconstructions and MIPs were obtained to evaluate the vascular anatomy. Carotid stenosis measurements (when applicable) are obtained utilizing NASCET criteria, using the distal internal carotid diameter as the denominator. CONTRAST:  50 cc Isovue 370 intravenous COMPARISON:  Head CT from earlier today FINDINGS: CTA NECK FINDINGS Aortic arch: Atherosclerosis that is mild.  Three vessel branching. Right carotid system: Moderate calcified plaque at the common carotid bifurcation and ICA bulb without stenosis, ulceration, or dissection. Left carotid system: Moderate predominately noncalcified plaque at the common carotid bifurcation and ICA bulb without flow limiting stenosis, ulceration, or dissection. Vertebral arteries: No brachiocephalic or proximal subclavian flow limiting stenosis. Left dominant vertebral artery. Both vessels are smooth and widely patent to the dura. Skeleton: Degenerative changes in the cervical spine. No acute or aggressive finding. Other neck: 9 mm left thyroid mass or cyst, considered incidental based on size and demographics. Upper chest: Right base density on scout image is from intrathoracic stomach. Review of the MIP images confirms the above findings CTA HEAD FINDINGS Anterior circulation: Confluent atherosclerotic plaque on the carotid siphons. No branch occlusion or flow limiting stenosis. Negative for aneurysm. Posterior circulation: Mild left vertebral artery dominance. Moderate atheromatous narrowing of the left vertebral artery due to noncalcified plaque just past the dura. The basilar is smooth and widely patent. Dysplastic distal basilar without true saccular aneurysm. Proximal left P2 segment occlusion with intermittent faint downstream reconstitution. Fetal type PCA on the right. Venous sinuses: Patent. Anatomic variants: None other than described above. Delayed phase: No abnormal intracranial enhancement. The left PCA infarct is nonenhancing. Review of the MIP images  confirms the above findings IMPRESSION: 1. Left proximal P2 occlusion with weak downstream reconstitution. 2. Moderate atheromatous narrowing at the left V4 segment.  3. Atherosclerosis without flow limiting stenosis in the anterior circulation. Electronically Signed   By: Monte Fantasia M.D.   On: 03/09/2017 14:20   Ct Angio Neck W Or Wo Contrast  Result Date: 03/09/2017 CLINICAL DATA:  Left PCA infarct. Visual changes and left arm weakness. EXAM: CT ANGIOGRAPHY HEAD AND NECK TECHNIQUE: Multidetector CT imaging of the head and neck was performed using the standard protocol during bolus administration of intravenous contrast. Multiplanar CT image reconstructions and MIPs were obtained to evaluate the vascular anatomy. Carotid stenosis measurements (when applicable) are obtained utilizing NASCET criteria, using the distal internal carotid diameter as the denominator. CONTRAST:  50 cc Isovue 370 intravenous COMPARISON:  Head CT from earlier today FINDINGS: CTA NECK FINDINGS Aortic arch: Atherosclerosis that is mild.  Three vessel branching. Right carotid system: Moderate calcified plaque at the common carotid bifurcation and ICA bulb without stenosis, ulceration, or dissection. Left carotid system: Moderate predominately noncalcified plaque at the common carotid bifurcation and ICA bulb without flow limiting stenosis, ulceration, or dissection. Vertebral arteries: No brachiocephalic or proximal subclavian flow limiting stenosis. Left dominant vertebral artery. Both vessels are smooth and widely patent to the dura. Skeleton: Degenerative changes in the cervical spine. No acute or aggressive finding. Other neck: 9 mm left thyroid mass or cyst, considered incidental based on size and demographics. Upper chest: Right base density on scout image is from intrathoracic stomach. Review of the MIP images confirms the above findings CTA HEAD FINDINGS Anterior circulation: Confluent atherosclerotic plaque on the carotid  siphons. No branch occlusion or flow limiting stenosis. Negative for aneurysm. Posterior circulation: Mild left vertebral artery dominance. Moderate atheromatous narrowing of the left vertebral artery due to noncalcified plaque just past the dura. The basilar is smooth and widely patent. Dysplastic distal basilar without true saccular aneurysm. Proximal left P2 segment occlusion with intermittent faint downstream reconstitution. Fetal type PCA on the right. Venous sinuses: Patent. Anatomic variants: None other than described above. Delayed phase: No abnormal intracranial enhancement. The left PCA infarct is nonenhancing. Review of the MIP images confirms the above findings IMPRESSION: 1. Left proximal P2 occlusion with weak downstream reconstitution. 2. Moderate atheromatous narrowing at the left V4 segment. 3. Atherosclerosis without flow limiting stenosis in the anterior circulation. Electronically Signed   By: Monte Fantasia M.D.   On: 03/09/2017 14:20   Ct Head Code Stroke W/o Cm  Result Date: 03/09/2017 CLINICAL DATA:  Code stroke.  Right-sided weakness EXAM: CT HEAD WITHOUT CONTRAST TECHNIQUE: Contiguous axial images were obtained from the base of the skull through the vertex without intravenous contrast. COMPARISON:  None. FINDINGS: Brain: Moderate area of cytotoxic type edema in the parasagittal left occipital lobe, likely subacute. Patient has had visual disturbance for a few days. There is patchy chronic microvascular ischemic change in the cerebral white matter with remote lacunar infarct in the right upper putamen. No cortical infarct seen in the ACA or MCA distributions. No hemorrhage, hydrocephalus, or mass. Vascular: Atherosclerotic calcification.  No hyperdense vessel. Skull: No acute or aggressive finding Sinuses/Orbits: Negative Other: These results were called by telephone at the time of interpretation on 03/09/2017 at 12:41 pm to Dr. Erlinda Hong, who verbally acknowledged these results. ASPECTS  [facility] Stroke Program Early CT Score) Not scored with the above findings. IMPRESSION: 1. Moderate infarct in the left occipital lobe correlating with history of subacute visual symptoms. No hemorrhagic conversion. 2. No visible MCA or ACA cortical infarct in this patient with weakness. 3. Moderate chronic small vessel ischemia. Electronically  Signed   By: Monte Fantasia M.D.   On: 03/09/2017 12:44    Lovenia Kim, MD 03/09/2017, 6:21 PM PGY-1, [facility] Intern pager: [phone], text pages welcome  I have seen and evaluated the patient with Dr. Reesa Chew. I am in agreement with the note above in its revised form. My additions are in red.  Wendee Beavers, MD, PGY-2 03/09/2017 7:43 PM

## 2017-03-09 NOTE — Code Documentation (Signed)
83yo female arriving to [facility] at 1221 via [place].  Patient from home where she reportedly woke up at her baseline.  She went outside at 1000 and did not feel well.  She returned inside and family noted slurred speech at 1100.  EMS called and activated a code stroke.  Stroke team at the bedside on patient arrival.  EMS reports right arm numbness, right leg weakness, slurred speech and right visual field deficit.  Of note, patient with h/o migraine with visual deficits.  Labs drawn and patient to CT with team.  Patient reporting 3-4 week h/o visual deficits that initially were intermittent but have since then progressed.  CT completed.  NIHSS 5, see documentation for details and code stroke times.  Patient with right hemianopia, mild right facial droop, right leg decreased sensation and mild dysarthria on exam.  CT showing moderate infarct in the left occipital lobe correlating with history of subacute visual symptoms.  Patient is outside the window for treatment with tPA d/t initial symptoms reported 3-4 weeks ago.  No acute stroke treatment at this time.  Patient to be admitted for stroke work-up.  Bedside handoff with ED RN Ubaldo Glassing.

## 2017-03-09 NOTE — ED Notes (Signed)
Pt back from MRI will go up to the floor

## 2017-03-10 ENCOUNTER — Inpatient Hospital Stay (HOSPITAL_COMMUNITY): Payer: Medicare Other

## 2017-03-10 DIAGNOSIS — I639 Cerebral infarction, unspecified: Secondary | ICD-10-CM

## 2017-03-10 DIAGNOSIS — I10 Essential (primary) hypertension: Secondary | ICD-10-CM

## 2017-03-10 LAB — HEMOGLOBIN A1C
Hgb A1c MFr Bld: 5.5 % (ref 4.8–5.6)
Hgb A1c MFr Bld: 5.6 % (ref 4.8–5.6)
Mean Plasma Glucose: 111 mg/dL
Mean Plasma Glucose: 114 mg/dL

## 2017-03-10 LAB — BASIC METABOLIC PANEL
Anion gap: 9 (ref 5–15)
BUN: 22 mg/dL — ABNORMAL HIGH (ref 6–20)
CO2: 25 mmol/L (ref 22–32)
Calcium: 9.2 mg/dL (ref 8.9–10.3)
Chloride: 106 mmol/L (ref 101–111)
Creatinine, Ser: 1.15 mg/dL — ABNORMAL HIGH (ref 0.44–1.00)
GFR calc Af Amer: 51 mL/min — ABNORMAL LOW (ref >60)
GFR calc non Af Amer: 44 mL/min — ABNORMAL LOW (ref >60)
Glucose, Bld: 97 mg/dL (ref 65–99)
Potassium: 5.1 mmol/L (ref 3.5–5.1)
Sodium: 140 mmol/L (ref 135–145)

## 2017-03-10 LAB — CBC
HCT: 38.5 % (ref 36.0–46.0)
Hemoglobin: 12 g/dL (ref 12.0–15.0)
MCH: 29.9 pg (ref 26.0–34.0)
MCHC: 31.2 g/dL (ref 30.0–36.0)
MCV: 96 fL (ref 78.0–100.0)
Platelets: 183 10*3/uL (ref 150–400)
RBC: 4.01 MIL/uL (ref 3.87–5.11)
RDW: 14.1 % (ref 11.5–15.5)
WBC: 6.5 10*3/uL (ref 4.0–10.5)

## 2017-03-10 LAB — TSH: TSH: 2.1 u[IU]/mL (ref 0.350–4.500)

## 2017-03-10 LAB — ECHOCARDIOGRAM COMPLETE
Height: 63 in
Weight: 2927.71 [oz_av]

## 2017-03-10 LAB — VITAMIN B12: Vitamin B-12: 334 pg/mL (ref 180–914)

## 2017-03-10 MED ORDER — ENOXAPARIN SODIUM 40 MG/0.4ML ~~LOC~~ SOLN
40.0000 mg | Freq: Every day | SUBCUTANEOUS | Status: DC
  Administered 2017-03-10: 40 mg via SUBCUTANEOUS
  Filled 2017-03-10: qty 0.4

## 2017-03-10 MED ORDER — PRAVASTATIN SODIUM 20 MG PO TABS
20.0000 mg | ORAL_TABLET | Freq: Every day | ORAL | Status: DC
  Administered 2017-03-10 – 2017-03-11 (×2): 20 mg via ORAL
  Filled 2017-03-10 (×2): qty 1

## 2017-03-10 MED ORDER — LATANOPROST 0.005 % OP SOLN
1.0000 [drp] | Freq: Every day | OPHTHALMIC | Status: DC
  Administered 2017-03-10: 1 [drp] via OPHTHALMIC
  Filled 2017-03-10: qty 2.5

## 2017-03-10 NOTE — Progress Notes (Signed)
    [facility] has been requested to perform a transesophageal echocardiogram on Alyssa Odonnell for stroke.  After careful review of history and examination, the risks and benefits of transesophageal echocardiogram have been explained including risks of esophageal damage, perforation (1:10,000 risk), bleeding, pharyngeal hematoma as well as other potential complications associated with conscious sedation including aspiration, arrhythmia, respiratory failure and death. Alternatives to treatment were discussed, questions were answered. Patient is willing to proceed.   The procedure is scheduled for tomorrow at 1300 with Dr. Acie Fredrickson.   Daune Perch, NP  03/10/2017 4:39 PM

## 2017-03-10 NOTE — Progress Notes (Signed)
Family Medicine Teaching Service Daily Progress Note Intern Pager: [phone]  Patient name: Alyssa Odonnell Medical record number: [id number] Date of birth: May 20, 1934 Age: 83 y.o. Gender: female  Primary Care Provider: Drake Leach, MD Consultants: Neurology  Code Status: FULL   Pt Overview and Major Events to Date:  Admit 5/22   Assessment and Plan: Alyssa Odonnell is a 83 y.o. female presenting with right sided weakness and R hemianopsia.  PMH is significant for HTN, PAD, Hypothyroidism, HLD, GERD, depression, right femoral emboli s/p stent by Dr. Scot Dock (off Plavix), and spinal stenosis with disc herniation.    R sided vision changes and weakness. Symptoms of weakness have mostly resolved.  CTA head/neck with moderate infarct in the left occipital lobe correlating with history of subacute visual symptoms (right hemianopsia). No hemorrhagic conversion. Moderate chronic small vessel ischemia.  CT Angio of head and neck revealed left proximal occlusion and atherosclerosis without flow limiting stenosis in anterior circulation.  Neurology consulted in ED.  NIH score of 4 and not a tPA candidate per Neurology. She was loaded with Plavix 300 mg in ED to be followed by 75 mg daily.  Given history of right femoral emboli s/p stent, known risk factors, and current symptoms, concern for recurrent cardioembolic event.   MRI brain without contrast showed acute to subacute nonhemorrhagic LEFT PCA territory infarct. Moderate chronic small vessel ischemic disease and old RIGHT basal ganglia infarct.  -Telemetry  -Neurology following >> plan for TEE and Loop recorder tomorrow 5/24  -Will add Lovenox for VTE prophylaxis              -Echo 60-65%, no regional wall motion abnormalities              -Will consider TEE and loop based on the following workup               -LE doppler for DVT pending              -Permissive hypertension (SBP <180 or DBP <105 for 24-48 h) then gradually normalize over 5-7 days              -Plavix 75 mg daily. Was loaded with 300 mg in ED. Allergic to aspirin.  -Start Pravastatin 20 mg daily; patient has not tolerated in the past but may be agreeable  -SLP/OT/PT consult  -Risk stratification labs ordered: TSH wnl, lipid panel with LDL 242, TG 162, total chol 319, A1c 5.6%.  -Neuro checks Q2 x12 hours then space to Q4  -Continuous pulse ox -Fall precautions -vitals per unit routine   Memory concerns: Dementia workup ordered -B12, folate, HIV, RPR, TSH  HLD. Recent lipid panel with LDL 241, HDL 51, TG 188, VLDL 38, total chol 330  Patient reports she is not on a statin medication.  Has tried this in the past and experienced upper extremity stiffness.   -We will start Pravastatin 20 mg daily   PAD.  Reports bilateral leg pain x 1 month. Difficult to assess for calf tenderness secondary as some of her pain may be attributed to varicosities.  Patient with no history of recent immobilization or long distance travel however will order LE dopplers to rule out clot.  -LE dopplers pending  -Will start Gabapentin 300 mg daily for pain  -SCDs for now in setting of possible stroke   H/o R femoral artery emboli s/p stent.  Patient with R toe discoloration in 06/2016 and seen by Dr. Scot Dock.  Found to have  right femoral artery 90% stenosis which appeared to be embolic event. She had a stent placed at that time and was put on Plavix.  On her last follow up with Dr. Scot Dock 10/2016 she was told that her stent had occluded and she was taken off plavix. She is also allergic to ASA.  -Plavix as above  Hypothyroidism.  Recent TSH unknown., but TSH on admission wnl.  At home on Synthroid 100 mcg daily.   -Will continue home Synthroid  HTN.  Mildly hypotensive overnight and this AM to 102/74.  On admission, blood pressure 113/70.  At home on Benicar HCT 20-12.5 mg daily.   -Hold antihypertensives in setting of possible stroke -Will allow permissive HTN over next several days   -Hydralazine 5 mg with parameters ordered (SBP>180 or DBP>105) -Monitor blood pressures  GERD.  At home on Omeprazole 20 mg daily.  -Protonix 40 mg daily   Spinal stenosis with ?bulging disc.  Patient with history of lumbar radiculopathy.  Follows with [facility] for physical therapy.  Reports this is helping and additionally, she takes 2 tabs Tylenol in AM for discomfort.  -Can continue Tylenol as needed for back pain   Depression.  Stable. Reports she has been on Zoloft for the past 10-12 years.    -Continue Zoloft 150 mg daily   FEN/GI: Heart healthy diet, Protonix, SLIV   Prophylaxis: Lovenox  Disposition: Continue to monitor on inpatient service.  Dispo pending clinical improvement.   Subjective:  Patient able to walk to bathroom without leg weakness. Is still having visual symptoms.  She additionally notes she has had some memory concerns recently.  She denies any other concerns at this time.    Objective: Temp:  [98.1 F (36.7 C)-98.4 F (36.9 C)] 98.4 F (36.9 C) (05/23 1406) Pulse Rate:  [62-92] 92 (05/23 1406) Resp:  [16-22] 20 (05/23 1406) BP: (96-128)/(54-78) 96/59 (05/23 1406) SpO2:  [95 %-100 %] 95 % (05/23 1406)   Physical Exam: General: Pleasant 83 yo F, NAD  Eyes: EOMI, PERRL, R hemianopia  ENTM: MMM, o/p clear Neck: supple, no JVD Cardiovascular: RRR,  2/6 SEM over RUSB and LUSB Respiratory: CTAB, comfortable work of breathing on RA  Gastrointestinal: soft, NTND, +bs MSK: ROM normal Derm: skin is warm and dry  Neuro: Awake, AOx4, CN II-XII intact, right-sided hemianopsia, no facial droop, speech is normal, 5/5 strength bilaterally in upper and lower extremities, sensation intact,  normal finger-to-nose, gait normal  Psych: normal affect   Laboratory:  Recent Labs Lab 03/09/17 1215 03/09/17 1230 03/10/17 0718  WBC 9.4  --  6.5  HGB 13.3 13.6 12.0  HCT 41.3 40.0 38.5  PLT 212  --  183    Recent Labs Lab 03/09/17 1215 03/09/17 1230  03/10/17 0718  NA 139 139 140  K 4.8 4.7 5.1  CL 107 107 106  CO2 22  --  25  BUN 35* 40* 22*  CREATININE 1.55* 1.50* 1.15*  CALCIUM 9.8  --  9.2  PROT 7.6  --   --   BILITOT 0.7  --   --   ALKPHOS 87  --   --   ALT 16  --   --   AST 23  --   --   GLUCOSE 118* 116* 97    Imaging/Diagnostic Tests: Mr Brain Wo Contrast  Result Date: 03/09/2017 CLINICAL DATA:  RIGHT-sided weakness, visual disturbances for month, increasing in frequency. Generalized weakness. History of hypertension, hyperlipidemia. Follow-up stroke. EXAM: MRI HEAD WITHOUT  CONTRAST TECHNIQUE: Multiplanar, multiecho pulse sequences of the brain and surrounding structures were obtained without intravenous contrast. COMPARISON:  CT HEAD Mar 09, 2017 at 1234 hours FINDINGS: BRAIN: Faint reduced diffusion LEFT mesial temporal lobe, and patchy reduced diffusion LEFT occipital lobe with slightly decreased ADC values, near normalization. Faint mineralization RIGHT angle basal ganglia associated with old infarct. No susceptibility artifact to suggest hemorrhage. Tiny probable perivascular spaces LEFT basal ganglia. Ventricles and sulci are normal for patient's age. Patchy to confluent supratentorial white matter FLAIR T2 hyperintensities. No midline shift, mass effect or masses. No abnormal extra-axial fluid collections. VASCULAR: Normal major intracranial vascular flow voids present at skull base. SKULL AND UPPER CERVICAL SPINE: No abnormal sellar expansion. No suspicious calvarial bone marrow signal. Craniocervical junction maintained. SINUSES/ORBITS: The mastoid air-cells and included paranasal sinuses are well-aerated. The included ocular globes and orbital contents are non-suspicious. OTHER: None. IMPRESSION: Acute to subacute nonhemorrhagic LEFT PCA territory infarct. Moderate chronic small vessel ischemic disease and old RIGHT basal ganglia infarct. Electronically Signed   By: Elon Alas M.D.   On: 03/09/2017 18:20    Lovenia Kim, MD 03/10/2017, 3:25 PM PGY-1, [place] Intern pager: [phone], text pages welcome

## 2017-03-10 NOTE — Progress Notes (Signed)
  Echocardiogram 2D Echocardiogram has been performed.  Alyssa Odonnell T Kadarious Dikes 03/10/2017, 12:43 PM

## 2017-03-10 NOTE — Progress Notes (Signed)
*  PRELIMINARY RESULTS* Vascular Ultrasound Lower extremity venous duplex has been completed.  Preliminary findings: No evidence of DVT or baker's cyst.   Landry Mellow, RDMS, RVT  03/10/2017, 4:30 PM

## 2017-03-10 NOTE — Evaluation (Signed)
Physical Therapy Evaluation Patient Details Name: Alyssa Odonnell MRN: [id number] DOB: 1934-08-05 Today's Date: 03/10/2017   History of Present Illness  Pt is an 83 y/o female admitted secondary to R sided weakness. 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 PMH includes HTN, gaucoma, and R THA.   Clinical Impression  Pt admitted secondary to problem above with deficits below. PTA, pt was independent with mobility and was not using AD. Pt was active and still gardening. Upon evaluation, pt presenting with R sided visual field deficits, blurry vision, decreased balance, and decreased STM. Pt required verbal cues for visual scanning to the R during gait to increase safety. Pt reporting she was having some STM problems as well as word finding difficulties, however, none noted in session. Pt reporting she has all necessary help at home. Recommending neuro outpatient PT to address current deficits and to increase functional mobility independence. Will continue to follow.     Follow Up Recommendations Outpatient PT;Supervision/Assistance - 24 hour (neuro outpatient PT )    Equipment Recommendations  None recommended by PT    Recommendations for Other Services       Precautions / Restrictions Precautions Precautions: Fall Restrictions Weight Bearing Restrictions: No      Mobility  Bed Mobility               General bed mobility comments: Pt in chair upon entry with OT   Transfers Overall transfer level: Needs assistance Equipment used: Rolling walker (2 wheeled) Transfers: Sit to/from Stand Sit to Stand: Min guard         General transfer comment: Min guard for safety. Demonstrated safe hand placement.   Ambulation/Gait Ambulation/Gait assistance: Min guard Ambulation Distance (Feet): 120 Feet Assistive device: Rolling walker (2 wheeled) Gait Pattern/deviations: Step-through pattern;Decreased stride length;Drifts right/left;Trunk flexed Gait  velocity: Decreased Gait velocity interpretation: Below normal speed for age/gender General Gait Details: Slow, cautious gait. Practiced visual scanning activity in hallway by reading room numbers as pt passed by them on the R. Pt with R sided inattention, however, able to visually scan to R with cues. Pt required cues for sequencing when turning to R with RW. Verbal cues for proximity to device.   Stairs            Wheelchair Mobility    Modified Rankin (Stroke Patients Only) Modified Rankin (Stroke Patients Only) Pre-Morbid Rankin Score: Slight disability Modified Rankin: Moderately severe disability     Balance Overall balance assessment: Needs assistance Sitting-balance support: No upper extremity supported;Feet supported Sitting balance-Leahy Scale: Good     Standing balance support: Bilateral upper extremity supported;No upper extremity supported Standing balance-Leahy Scale: Poor Standing balance comment: Reliant on RW                              Pertinent Vitals/Pain Pain Assessment: No/denies pain    Home Living Family/patient expects to be discharged to:: Private residence Living Arrangements: Children;Other relatives Available Help at Discharge: Family;Available 24 hours/day Type of Home: House Home Access: Stairs to enter Entrance Stairs-Rails: Right;Left Entrance Stairs-Number of Steps: 4 at front, 2 from garage  Home Layout: Two level;Able to live on main level with bedroom/bathroom Home Equipment: Bedside commode;Toilet riser;Cane - single point;Walker - 4 wheels;Walker - 2 wheels;Shower seat      Prior Function Level of Independence: Independent  Hand Dominance   Dominant Hand: Right    Extremity/Trunk Assessment   Upper Extremity Assessment Upper Extremity Assessment: Defer to OT evaluation    Lower Extremity Assessment Lower Extremity Assessment: LLE deficits/detail;RLE deficits/detail RLE Deficits /  Details: RLE pain on posterior aspect of calf. Grossly 3+/5 throughout. Sensory in tact.  LLE Deficits / Details: LLE pain on posterior aspect of calf. Grossly 3+/5 throughout. Sensory in tact       Communication   Communication: No difficulties  Cognition Arousal/Alertness: Awake/alert Behavior During Therapy: WFL for tasks assessed/performed Overall Cognitive Status: Impaired/Different from baseline Area of Impairment: Memory                     Memory: Decreased short-term memory         General Comments: Pt reports problems remembering things. Pt also reporting expressive difficulties, but none noted during session.       General Comments General comments (skin integrity, edema, etc.): Pt reportign blurry vision and that objects "fade in an out" when she looks at them. Pt also with inattention to R. Educated pt about current deficits and follow up recommendations. Pt agreeable.     Exercises     Assessment/Plan    PT Assessment Patient needs continued PT services  PT Problem List Decreased strength;Decreased balance;Decreased mobility;Decreased knowledge of use of DME;Decreased cognition;Decreased knowledge of precautions;Other (comment) (R visual field deficit)       PT Treatment Interventions DME instruction;Gait training;Stair training;Functional mobility training;Therapeutic activities;Therapeutic exercise;Balance training;Neuromuscular re-education;Cognitive remediation;Patient/family education    PT Goals (Current goals can be found in the Care Plan section)  Acute Rehab PT Goals Patient Stated Goal: to get better  PT Goal Formulation: With patient Time For Goal Achievement: 03/24/17 Potential to Achieve Goals: Good    Frequency Min 4X/week   Barriers to discharge        Co-evaluation               AM-PAC PT "6 Clicks" Daily Activity  Outcome Measure Difficulty turning over in bed (including adjusting bedclothes, sheets and blankets)?: A  Lot Difficulty moving from lying on back to sitting on the side of the bed? : A Lot Difficulty sitting down on and standing up from a chair with arms (e.g., wheelchair, bedside commode, etc,.)?: Total Help needed moving to and from a bed to chair (including a wheelchair)?: A Little Help needed walking in hospital room?: A Little Help needed climbing 3-5 steps with a railing? : A Little 6 Click Score: 14    End of Session Equipment Utilized During Treatment: Gait belt Activity Tolerance: Patient tolerated treatment well Patient left: in bed;with call bell/phone within reach;with bed alarm set;Other (comment) (with echo staff in room ) Nurse Communication: Mobility status PT Visit Diagnosis: Other abnormalities of gait and mobility (R26.89);Other symptoms and signs involving the nervous system (R29.898)    Time: 1607-3710 PT Time Calculation (min) (ACUTE ONLY): 19 min   Charges:   PT Evaluation $PT Eval Moderate Complexity: 1 Procedure PT Treatments $Gait Training: 8-22 mins   PT G Codes:        Nicky Pugh, PT, DPT  Acute Rehabilitation Services  Pager: [phone]   Army Melia 03/10/2017, 12:59 PM

## 2017-03-10 NOTE — Progress Notes (Signed)
STROKE TEAM PROGRESS NOTE   HISTORY OF PRESENT ILLNESS (per record) Alyssa Odonnell is a 83 y.o. Caucasian female with PMH of HTN, HLD, migraine equivalent, right femoral emboli s/p stent by Dr. Scot Dock off plavix presented as code stroke.  She stated that she has had migraine visual aura for 20-30 years, she did not have HA but has visual disturbance 3-4 times a year and lasting no more than one day. However, for the last 2-3 weeks she started to have visual disturbance again with funny shapes, missing words, etc, but never went away. For the last 1-2 weeks, the visual disturbance constant and stayed with her. Today she went to her garden and felt right arm numbness and right leg mild weakness around 9:30am, she went back in house and had shower but symptoms continue. She also felt slurry speech. EMS called and she was sent her for code stroke. CT showed left PCA infarct likely subacute.  She had right toe discoloration 06/2016 and seen by Dr. Scot Dock, found to have right femoral artery 90% stenosis, felt to be embolic event. She had stent placed. She was put on plavix. On her last follow up with Dr. Scot Dock 10/2016 and she was told that her stent had occluded and she was taken off plavix. She is also allergic to ASA.   She was on statin for HLD but she can not tolerate it due to joint pain and edema. She takes benica for HTN but her BP never high as per pt.   LSN: 2-3 weeks ago  SUBJECTIVE (INTERVAL HISTORY) Her RN is at the bedside.  Pt still has right hemianopia. Educated her on no driving. She agreed. MRI showed left PCA infarct. Will do TEE and loop.    OBJECTIVE Temp:  [97.9 F (36.6 C)-98.4 F (36.9 C)] 98.1 F (36.7 C) (05/23 0505) Pulse Rate:  [62-88] 73 (05/23 0505) Cardiac Rhythm: Normal sinus rhythm (05/22 1900) Resp:  [14-22] 18 (05/23 0505) BP: (102-128)/(54-78) 102/74 (05/23 0505) SpO2:  [95 %-100 %] 98 % (05/23 0505) Weight:  [83 kg (182 lb 15.7 oz)] 83 kg (182 lb 15.7  oz) (05/22 1242)  CBC:  Recent Labs Lab 03/09/17 1215 03/09/17 1230 03/10/17 0718  WBC 9.4  --  6.5  NEUTROABS 5.9  --   --   HGB 13.3 13.6 12.0  HCT 41.3 40.0 38.5  MCV 95.2  --  96.0  PLT 212  --  829    Basic Metabolic Panel:  Recent Labs Lab 03/09/17 1215 03/09/17 1230  NA 139 139  K 4.8 4.7  CL 107 107  CO2 22  --   GLUCOSE 118* 116*  BUN 35* 40*  CREATININE 1.55* 1.50*  CALCIUM 9.8  --     Lipid Panel:    Component Value Date/Time   CHOL 319 (H) 03/09/2017 1932   TRIG 162 (H) 03/09/2017 1932   HDL 45 03/09/2017 1932   CHOLHDL 7.1 03/09/2017 1932   VLDL 32 03/09/2017 1932   LDLCALC 242 (H) 03/09/2017 1932   HgbA1c:  Lab Results  Component Value Date   HGBA1C 5.6 03/09/2017   Urine Drug Screen: No results found for: LABOPIA, COCAINSCRNUR, LABBENZ, AMPHETMU, THCU, LABBARB  Alcohol Level No results found for: ETH  IMAGING  Ct Angio Head W Or Wo Contrast Ct Angio Neck W Or Wo Contrast 03/09/2017 1. Left proximal P2 occlusion with weak downstream reconstitution.  2. Moderate atheromatous narrowing at the left V4 segment.  3. Atherosclerosis without flow  limiting stenosis in the anterior circulation.   Mr Brain Wo Contrast 03/09/2017 Acute to subacute nonhemorrhagic LEFT PCA territory infarct. Moderate chronic small vessel ischemic disease and old RIGHT basal ganglia infarct.  Ct Head Code Stroke W/o Cm 03/09/2017 1. Moderate infarct in the left occipital lobe correlating with history of subacute visual symptoms. No hemorrhagic conversion.  2. No visible MCA or ACA cortical infarct in this patient with weakness.  3. Moderate chronic small vessel ischemia.  TTE  Left ventricle: The cavity size was normal. Systolic function was   normal. The estimated ejection fraction was in the range of 60%   to 65%. Wall motion was normal; there were no regional wall   motion abnormalities. Left ventricular diastolic function   parameters were normal. - Aortic  valve: There was very mild stenosis. There was no   regurgitation. Peak velocity (S): 211 cm/s. Mean gradient (S): 10   mm Hg. Valve area (VTI): 1.49 cm^2. Valve area (Vmax): 1.36 cm^2.   Valve area (Vmean): 1.34 cm^2. - Mitral valve: Transvalvular velocity was within the normal range.   There was no evidence for stenosis. There was no regurgitation. - Right ventricle: The cavity size was normal. Wall thickness was   normal. Systolic function was normal. - Atrial septum: No defect or patent foramen ovale was identified   by color flow Doppler. - Tricuspid valve: There was mild regurgitation. - Pulmonary arteries: Systolic pressure was within the normal   range. PA peak pressure: 23 mm Hg (S).  LE venous doppler - No evidence of DVT or baker's cyst.  TEE pending   PHYSICAL EXAM  Temp:  [98.1 F (36.7 C)-98.5 F (36.9 C)] 98.5 F (36.9 C) (05/23 2119) Pulse Rate:  [62-96] 95 (05/23 2119) Resp:  [16-20] 20 (05/23 2119) BP: (96-127)/(56-74) 121/56 (05/23 2119) SpO2:  [95 %-99 %] 99 % (05/23 2119)  Mental Status -  Level of arousal and orientation to time, place, and person were intact. Language including expression, naming, repetition, comprehension, reading, and writing was assessed and found intact. Fund of Knowledge was assessed and was intact.  Cranial Nerves II - XII - II - right homonymous hemianopia. III, IV, VI - Extraocular movements intact. V - Facial sensation intact bilaterally. VII - mild left nasolabial fold flattening. VIII - Hearing & vestibular intact bilaterally. X - Palate elevates symmetrically, mild dysarthria. XI - Chin turning & shoulder shrug intact bilaterally. XII - Tongue protrusion intact.  Motor Strength - The patient's strength was normal in all extremities except mild right hand dexterity difficulty and pronator drift was absent.   Motor Tone & Bulk - Muscle tone was assessed at the neck and appendages and was normal.  Bulk was normal and  fasciculations were absent.   Reflexes - The patient's reflexes were normal in all extremities and she had no pathological reflexes.  Sensory - Light touch, temperature/pinprick were assessed and were normal.    Coordination - The patient had normal movements in the hands with no ataxia or dysmetria.  Tremor was absent.  Gait and Station - deferred   ASSESSMENT/PLAN [name] is a 82 y.o. female with history of hyperlipidemia, migraine equivalent, right femoral artery stenosis status post stenting with subsequent occlusion, hypertension, hypothyroidism, and glaucoma presenting with right arm numbness and right leg mild weakness around 9:30am, she went back in house and had shower but symptoms continue. She also felt slurry speech. She did not receive IV t-PA due to late presentation.  Stroke:  Left PCA infarct, possibly embolic - source unknown.  Resultant  Right hemianopia  CT head - Moderate infarct in the left occipital lobe  MRI head - Acute to subacute nonhemorrhagic Lt PCA territory infarct. Old RIGHT basal ganglia infarct.  CTA H&N - Left proximal P2 occlusion  2D Echo - EF 60-65%  Recommend TEE/loop to evaluate cardiac source  LDL - 242  HgbA1c - 5.6  VTE prophylaxis - Lovenox  Diet Heart Room service appropriate? Yes; Fluid consistency: Thin  clopidogrel 75 mg daily prior to admission, now on clopidogrel 75 mg daily. Pt has ASA allergy.   Patient counseled to be compliant with her antithrombotic medications  Ongoing aggressive stroke risk factor management  Therapy recommendations:  Outpatient PT and OT recommended.  Disposition:  Pending  Hx of right femoral artery thrombosis  06/2016 with Dr. Scot Dock, concerning for embolic event  Follows with Dr. Scot Dock, ASA stopped and on plavix  Had follow up in 10/2016 with [facility] and was told stent re-occluded. plavix discontinued.  Hypertension  Blood pressure tends to run low - currently not on  scheduled blood pressure medication.  Permissive hypertension (OK if < 220/120) but gradually normalize in 5-7 days  Long-term BP goal normotensive  Hyperlipidemia  Home meds: No lipid lowering medications prior to admission.  LDL 242, goal < 70  Statin allergy - consider PCSK9 inhibitors.  Other Stroke Risk Factors  Advanced age  Obesity, Body mass index is 32.41 kg/m., recommend weight loss, diet and exercise as appropriate   Migraine  Other Active Problems  Aspirin allergy  Hospital day # 1  Rosalin Hawking, MD PhD Stroke Neurology 03/10/2017 10:50 PM    To contact Stroke Continuity provider, please refer to [email or web address] After hours, contact General Neurology

## 2017-03-10 NOTE — Care Management Note (Signed)
Case Management Note  Patient Details  Name: DESTINE ZIRKLE MRN: [id number] Date of Birth: Oct 31, 1933  Subjective/Objective:    Pt admitted with CVA. She is from home with family.                Action/Plan: Awaiting PT/OT recommendations. CM following for d/c needs, physician orders.   Expected Discharge Date:                  Expected Discharge Plan:     In-House Referral:     Discharge planning Services     Post Acute Care Choice:    Choice offered to:     DME Arranged:    DME Agency:     HH Arranged:    HH Agency:     Status of Service:  In process, will continue to follow  If discussed at Long Length of Stay Meetings, dates discussed:    Additional Comments:  Pollie Friar, RN 03/10/2017, 11:43 AM

## 2017-03-10 NOTE — Evaluation (Addendum)
Occupational Therapy Evaluation Patient Details Name: Alyssa Odonnell MRN: [id number] DOB: December 19, 1933 Today's Date: 03/10/2017    History of Present Illness Pt is an 83 y/o female admitted secondary to R sided weakness. 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 PMH includes HTN, gaucoma, and R THA.    Clinical Impression   This 83 y/o F presents with the above. At baseline Pt is independent with ADLs and functional mobility. Pt currently requires MinGuard Assist for functional mobility and ADLs with R visual field deficits noted. Pt lives with family who are available for 24 hr assist PRN. Pt will benefit from continued OT services both in acute and post acute settings to maximize safety and independence with ADLs and functional mobility.      Follow Up Recommendations  Outpatient OT;Supervision/Assistance - 24 hour (Outpatient neuro)    Equipment Recommendations  None recommended by OT           Precautions / Restrictions Precautions Precautions: Fall Restrictions Weight Bearing Restrictions: No      Mobility Bed Mobility Overal bed mobility: Needs Assistance Bed Mobility: Supine to Sit     Supine to sit: Supervision;HOB elevated     General bed mobility comments: Pt in chair upon entry with OT   Transfers Overall transfer level: Needs assistance Equipment used: Rolling walker (2 wheeled) Transfers: Sit to/from Stand Sit to Stand: Min guard         General transfer comment: Min guard for safety. Demonstrated safe hand placement.     Balance Overall balance assessment: Needs assistance Sitting-balance support: No upper extremity supported;Feet supported Sitting balance-Leahy Scale: Good     Standing balance support: Bilateral upper extremity supported;No upper extremity supported Standing balance-Leahy Scale: Poor Standing balance comment: Reliant on RW                            ADL either performed or assessed with  clinical judgement   ADL Overall ADL's : Needs assistance/impaired Eating/Feeding: Independent;Sitting   Grooming: Wash/dry hands;Oral care;Min guard;Standing   Upper Body Bathing: Set up;Sitting   Lower Body Bathing: Min guard;Sit to/from stand   Upper Body Dressing : Set up;Sitting   Lower Body Dressing: Sit to/from stand;Minimal assistance   Toilet Transfer: Min guard;Ambulation;BSC;RW Toilet Transfer Details (indicate cue type and reason): BSC over toilet  Toileting- Clothing Manipulation and Hygiene: Min guard;Sit to/from stand       Functional mobility during ADLs: Min guard;Rolling walker General ADL Comments: Pt completed room level functional mobility, toilet transfer, toileting, standing grooming ADLs; impairements noted in R visual field      Vision Baseline Vision/History: No visual deficits Vision Assessment?: Yes Eye Alignment: Within Functional Limits Ocular Range of Motion: Within Functional Limits Tracking/Visual Pursuits: Able to track stimulus in all quads without difficulty Visual Fields: Right visual field deficit (Neurology notes R homonymous hemianopsia )                Pertinent Vitals/Pain Pain Assessment: No/denies pain     Hand Dominance Right   Extremity/Trunk Assessment Upper Extremity Assessment Upper Extremity Assessment: RUE deficits/detail RUE Deficits / Details: Minimal weakness noted in RUE (R>L); sensory intact; AROM WFL    Lower Extremity Assessment Lower Extremity Assessment: LLE deficits/detail;RLE deficits/detail RLE Deficits / Details: RLE pain on posterior aspect of calf. Grossly 3+/5 throughout. Sensory in tact.  LLE Deficits / Details: LLE pain on posterior aspect of calf. Grossly  3+/5 throughout. Sensory in tact       Communication Communication Communication: No difficulties   Cognition Arousal/Alertness: Awake/alert Behavior During Therapy: WFL for tasks assessed/performed Overall Cognitive Status:  Impaired/Different from baseline Area of Impairment: Memory                     Memory: Decreased short-term memory         General Comments: Pt reports problems remembering things.    General Comments  Pt reportign blurry vision and that objects "fade in an out" when she looks at them. Pt also with inattention to R. Educated pt about current deficits and follow up recommendations. Pt agreeable.                Home Living Family/patient expects to be discharged to:: Private residence Living Arrangements: Children;Other relatives Available Help at Discharge: Family;Available 24 hours/day Type of Home: House Home Access: Stairs to enter [employer] of Steps: 4 at front, 2 from garage  Entrance Stairs-Rails: [place]: Two level;Able to live on main level with bedroom/bathroom Alternate Level Stairs-Number of Steps: Pt's bed room on first floor  Alternate Level Stairs-Rails: Left;Right Bathroom Shower/Tub: Walk-in shower;Tub/shower unit (typically uses tub, uses step stool to step in )   Bathroom Toilet: Standard Bathroom Accessibility: Yes How Accessible: Accessible via walker Home Equipment: Bedside commode;Toilet riser;Cane - single point;Walker - 4 wheels;Walker - 2 wheels;Shower seat          Prior Functioning/Environment Level of Independence: Independent        Comments: Pt spends a lot of her free time gardening         OT Problem List: Decreased strength;Decreased activity tolerance;Impaired balance (sitting and/or standing);Impaired vision/perception      OT Treatment/Interventions: Self-care/ADL training;DME and/or AE instruction;Therapeutic activities;Therapeutic exercise;Patient/family education;Visual/perceptual remediation/compensation    OT Goals(Current goals can be found in the care plan section) Acute Rehab OT Goals Patient Stated Goal: to get better  OT Goal Formulation: With patient Time For Goal Achievement:  03/24/17 Potential to Achieve Goals: Good ADL Goals Pt Will Perform Grooming: with supervision;standing Pt Will Perform Upper Body Bathing: sitting;with set-up Pt Will Perform Lower Body Dressing: with supervision;sit to/from stand Pt Will Perform Toileting - Clothing Manipulation and hygiene: with supervision;sit to/from stand Pt/caregiver will Perform Home Exercise Program: Increased strength;Both right and left upper extremity;Independently Additional ADL Goal #1: Pt will complete ADL activity while independently scanning R side of environment during task completion.   OT Frequency: Min 2X/week                             AM-PAC PT "6 Clicks" Daily Activity     Outcome Measure Help from another person eating meals?: None Help from another person taking care of personal grooming?: A Little Help from another person toileting, which includes using toliet, bedpan, or urinal?: A Little Help from another person bathing (including washing, rinsing, drying)?: A Little Help from another person to put on and taking off regular upper body clothing?: A Little Help from another person to put on and taking off regular lower body clothing?: A Little 6 Click Score: 19   End of Session Equipment Utilized During Treatment: Gait belt;Rolling walker Nurse Communication: Mobility status  Activity Tolerance: Patient tolerated treatment well Patient left: in chair;with call bell/phone within reach (PT entering room to begin session)  OT Visit Diagnosis: Muscle weakness (generalized) (M62.81);Other symptoms and signs involving  the nervous system (R29.898);Unsteadiness on feet (R26.81)                Time: 1107-1140 OT Time Calculation (min): 33 min Charges:  OT General Charges $OT Visit: 1 Procedure OT Evaluation $OT Eval Low Complexity: 1 Procedure OT Treatments $Self Care/Home Management : 8-22 mins G-Codes:     Lou Cal, OT Pager [phone] 03/10/2017  [name] 03/10/2017, 1:30 PM

## 2017-03-11 ENCOUNTER — Encounter (HOSPITAL_COMMUNITY): Payer: Self-pay

## 2017-03-11 ENCOUNTER — Inpatient Hospital Stay (HOSPITAL_COMMUNITY): Payer: Medicare Other

## 2017-03-11 ENCOUNTER — Encounter (HOSPITAL_COMMUNITY)
Admission: EM | Disposition: A | Discharge status: Home / Self Care | Payer: Self-pay | Source: Home / Self Care | Attending: Family Medicine

## 2017-03-11 DIAGNOSIS — I639 Cerebral infarction, unspecified: Secondary | ICD-10-CM

## 2017-03-11 DIAGNOSIS — I638 Other cerebral infarction: Secondary | ICD-10-CM

## 2017-03-11 HISTORY — PX: TEE WITHOUT CARDIOVERSION: SHX5443

## 2017-03-11 HISTORY — PX: LOOP RECORDER INSERTION: EP1214

## 2017-03-11 LAB — FOLATE RBC
Folate, Hemolysate: 368.3 ng/mL
Folate, RBC: 972 ng/mL (ref >498)
Hematocrit: 37.9 % (ref 34.0–46.6)

## 2017-03-11 LAB — CBC
HCT: 37.2 % (ref 36.0–46.0)
Hemoglobin: 11.6 g/dL — ABNORMAL LOW (ref 12.0–15.0)
MCH: 30.1 pg (ref 26.0–34.0)
MCHC: 31.2 g/dL (ref 30.0–36.0)
MCV: 96.4 fL (ref 78.0–100.0)
Platelets: 180 10*3/uL (ref 150–400)
RBC: 3.86 MIL/uL — ABNORMAL LOW (ref 3.87–5.11)
RDW: 14.1 % (ref 11.5–15.5)
WBC: 6.4 10*3/uL (ref 4.0–10.5)

## 2017-03-11 LAB — BASIC METABOLIC PANEL
Anion gap: 9 (ref 5–15)
BUN: 26 mg/dL — ABNORMAL HIGH (ref 6–20)
CO2: 21 mmol/L — ABNORMAL LOW (ref 22–32)
Calcium: 9.1 mg/dL (ref 8.9–10.3)
Chloride: 108 mmol/L (ref 101–111)
Creatinine, Ser: 1.13 mg/dL — ABNORMAL HIGH (ref 0.44–1.00)
GFR calc Af Amer: 52 mL/min — ABNORMAL LOW (ref >60)
GFR calc non Af Amer: 45 mL/min — ABNORMAL LOW (ref >60)
Glucose, Bld: 104 mg/dL — ABNORMAL HIGH (ref 65–99)
Potassium: 4.7 mmol/L (ref 3.5–5.1)
Sodium: 138 mmol/L (ref 135–145)

## 2017-03-11 LAB — RPR: RPR Ser Ql: NONREACTIVE

## 2017-03-11 LAB — HIV ANTIBODY (ROUTINE TESTING W REFLEX): HIV Screen 4th Generation wRfx: NONREACTIVE

## 2017-03-11 SURGERY — ECHOCARDIOGRAM, TRANSESOPHAGEAL
Anesthesia: Moderate Sedation

## 2017-03-11 SURGERY — LOOP RECORDER INSERTION
Anesthesia: LOCAL

## 2017-03-11 MED ORDER — LIDOCAINE HCL (PF) 1 % IJ SOLN
INTRAMUSCULAR | Status: DC | PRN
  Administered 2017-03-11: 15 mL

## 2017-03-11 MED ORDER — FENTANYL CITRATE (PF) 100 MCG/2ML IJ SOLN
INTRAMUSCULAR | Status: AC
  Filled 2017-03-11: qty 2

## 2017-03-11 MED ORDER — ONDANSETRON HCL 4 MG/2ML IJ SOLN: 4.0000 mg | Freq: Four times a day (QID) | INTRAMUSCULAR | Status: DC | PRN

## 2017-03-11 MED ORDER — LIDOCAINE HCL (PF) 1 % IJ SOLN
INTRAMUSCULAR | Status: AC
Start: 2017-03-11 — End: 2017-03-11
  Filled 2017-03-11: qty 30

## 2017-03-11 MED ORDER — GABAPENTIN 300 MG PO CAPS
300.0000 mg | ORAL_CAPSULE | Freq: Three times a day (TID) | ORAL | 0 refills | Status: DC
Start: 2017-03-11 — End: 2017-05-24

## 2017-03-11 MED ORDER — MIDAZOLAM HCL 5 MG/ML IJ SOLN
INTRAMUSCULAR | Status: AC
  Filled 2017-03-11: qty 2

## 2017-03-11 MED ORDER — FENTANYL CITRATE (PF) 100 MCG/2ML IJ SOLN
INTRAMUSCULAR | Status: DC | PRN
  Administered 2017-03-11 (×2): 25 ug via INTRAVENOUS

## 2017-03-11 MED ORDER — HEPARIN SODIUM (PORCINE) 5000 UNIT/ML IJ SOLN: 5000.0000 [IU] | Freq: Three times a day (TID) | INTRAMUSCULAR | Status: DC

## 2017-03-11 MED ORDER — GABAPENTIN 300 MG PO CAPS
300.0000 mg | ORAL_CAPSULE | Freq: Three times a day (TID) | ORAL | Status: DC
  Filled 2017-03-11: qty 1

## 2017-03-11 MED ORDER — PRAVASTATIN SODIUM 20 MG PO TABS: 20.0000 mg | ORAL_TABLET | Freq: Every day | ORAL | 0 refills | Status: DC

## 2017-03-11 MED ORDER — MIDAZOLAM HCL 10 MG/2ML IJ SOLN
INTRAMUSCULAR | Status: DC | PRN
  Administered 2017-03-11 (×2): 2 mg via INTRAVENOUS
  Administered 2017-03-11: 1 mg via INTRAVENOUS

## 2017-03-11 MED ORDER — ACETAMINOPHEN 325 MG PO TABS: 325.0000 mg | ORAL_TABLET | ORAL | Status: DC | PRN

## 2017-03-11 SURGICAL SUPPLY — 2 items
LOOP REVEAL LINQSYS (Prosthesis & Implant Heart) ×2 IMPLANT
PACK LOOP INSERTION (CUSTOM PROCEDURE TRAY) ×2 IMPLANT

## 2017-03-11 NOTE — Progress Notes (Addendum)
STROKE TEAM PROGRESS NOTE   SUBJECTIVE (INTERVAL HISTORY) Her physical therapist is working with her in the hallway.  Pt still has right hemianopia. TEE not successful due to esophagus stricture. Pending loop insertion.   OBJECTIVE Temp:  [98 F (36.7 C)-98.5 F (36.9 C)] 98 F (36.7 C) (05/24 0643) Pulse Rate:  [72-96] 72 (05/24 0643) Cardiac Rhythm: Normal sinus rhythm (05/23 1900) Resp:  [20] 20 (05/24 0643) BP: (96-127)/(52-60) 115/52 (05/24 0643) SpO2:  [95 %-99 %] 99 % (05/24 0643)  CBC:  Recent Labs Lab 03/09/17 1215  03/10/17 0718 03/11/17 0442  WBC 9.4  --  6.5 6.4  NEUTROABS 5.9  --   --   --   HGB 13.3  < > 12.0 11.6*  HCT 41.3  < > 38.5 37.2  MCV 95.2  --  96.0 96.4  PLT 212  --  183 180  < > = values in this interval not displayed.  Basic Metabolic Panel:   Recent Labs Lab 03/10/17 0718 03/11/17 0442  NA 140 138  K 5.1 4.7  CL 106 108  CO2 25 21*  GLUCOSE 97 104*  BUN 22* 26*  CREATININE 1.15* 1.13*  CALCIUM 9.2 9.1    Lipid Panel:     Component Value Date/Time   CHOL 319 (H) 03/09/2017 1932   TRIG 162 (H) 03/09/2017 1932   HDL 45 03/09/2017 1932   CHOLHDL 7.1 03/09/2017 1932   VLDL 32 03/09/2017 1932   LDLCALC 242 (H) 03/09/2017 1932   HgbA1c:  Lab Results  Component Value Date   HGBA1C 5.6 03/09/2017   Urine Drug Screen: No results found for: LABOPIA, COCAINSCRNUR, LABBENZ, AMPHETMU, THCU, LABBARB  Alcohol Level No results found for: [place] I have personally reviewed the radiological images below and agree with the radiology interpretations.  Ct Angio Head W Or Wo Contrast Ct Angio Neck W Or Wo Contrast 03/09/2017 1. Left proximal P2 occlusion with weak downstream reconstitution.  2. Moderate atheromatous narrowing at the left V4 segment.  3. Atherosclerosis without flow limiting stenosis in the anterior circulation.   Mr Brain Wo Contrast 03/09/2017 Acute to subacute nonhemorrhagic LEFT PCA territory infarct. Moderate  chronic small vessel ischemic disease and old RIGHT basal ganglia infarct.  Ct Head Code Stroke W/o Cm 03/09/2017 1. Moderate infarct in the left occipital lobe correlating with history of subacute visual symptoms. No hemorrhagic conversion.  2. No visible MCA or ACA cortical infarct in this patient with weakness.  3. Moderate chronic small vessel ischemia.  TTE  Left ventricle: The cavity size was normal. Systolic function was   normal. The estimated ejection fraction was in the range of 60%   to 65%. Wall motion was normal; there were no regional wall   motion abnormalities. Left ventricular diastolic function   parameters were normal. - Aortic valve: There was very mild stenosis. There was no   regurgitation. Peak velocity (S): 211 cm/s. Mean gradient (S): 10   mm Hg. Valve area (VTI): 1.49 cm^2. Valve area (Vmax): 1.36 cm^2.   Valve area (Vmean): 1.34 cm^2. - Mitral valve: Transvalvular velocity was within the normal range.   There was no evidence for stenosis. There was no regurgitation. - Right ventricle: The cavity size was normal. Wall thickness was   normal. Systolic function was normal. - Atrial septum: No defect or patent foramen ovale was identified   by color flow Doppler. - Tricuspid valve: There was mild regurgitation. - Pulmonary arteries: Systolic pressure was within  the normal   range. PA peak pressure: 23 mm Hg (S).  LE venous doppler - No evidence of DVT or baker's cyst.  TEE not successful due to esophageal stricture   PHYSICAL EXAM  Temp:  [98 F (36.7 C)-98.5 F (36.9 C)] 98 F (36.7 C) (05/24 4270) Pulse Rate:  [72-96] 72 (05/24 0643) Resp:  [20] 20 (05/24 0643) BP: (96-127)/(52-60) 115/52 (05/24 0643) SpO2:  [95 %-99 %] 99 % (05/24 0643)  Mental Status -  Level of arousal and orientation to time, place, and person were intact. Language including expression, naming, repetition, comprehension, reading, and writing was assessed and found  intact. Fund of Knowledge was assessed and was intact.  Cranial Nerves II - XII - II - right homonymous hemianopia. III, IV, VI - Extraocular movements intact. V - Facial sensation intact bilaterally. VII - mild left nasolabial fold flattening. VIII - Hearing & vestibular intact bilaterally. X - Palate elevates symmetrically, mild dysarthria. XI - Chin turning & shoulder shrug intact bilaterally. XII - Tongue protrusion intact.  Motor Strength - The patient's strength was normal in all extremities except mild right hand dexterity difficulty and pronator drift was absent.   Motor Tone & Bulk - Muscle tone was assessed at the neck and appendages and was normal.  Bulk was normal and fasciculations were absent.   Reflexes - The patient's reflexes were normal in all extremities and she had no pathological reflexes.  Sensory - Light touch, temperature/pinprick were assessed and were normal.    Coordination - The patient had normal movements in the hands with no ataxia or dysmetria.  Tremor was absent.  Gait and Station - deferred   ASSESSMENT/PLAN [name] is a 83 y.o. female with history of hyperlipidemia, migraine equivalent, right femoral artery stenosis status post stenting with subsequent occlusion, hypertension, hypothyroidism, and glaucoma presenting with right arm numbness and right leg mild weakness around 9:30am, she went back in house and had shower but symptoms continue. She also felt slurry speech. She did not receive IV t-PA due to late presentation.  Stroke:  Left PCA infarct, possibly embolic - source unknown.  Resultant  Right hemianopia  CT head - Moderate infarct in the left occipital lobe  MRI head - Acute to subacute nonhemorrhagic Lt PCA territory infarct. Old RIGHT basal ganglia infarct.  CTA H&N - Left proximal P2 occlusion  2D Echo - EF 60-65%  TEE not successful due to esophageal stricture  Loop recorder placed  LDL - 242  HgbA1c -  5.6  VTE prophylaxis - Lovenox Diet NPO time specified Except for: Sips with Meds  No antithrombotics prior to admission, now on clopidogrel 75 mg daily. Pt has ASA allergy. Continue plavix on discharge.  Patient counseled to be compliant with her antithrombotic medications  Ongoing aggressive stroke risk factor management  Therapy recommendations:  Outpatient PT and OT recommended.  Disposition:  Pending  Pt was educated on no driving and need to follow up with ophthalmology  Hx of right femoral artery thrombosis - embolic event  03/2375 with Dr. Scot Dock, concerning for embolic event  Follows with Dr. Scot Dock, ASA stopped and on plavix  Had follow up in 10/2016 with [facility] and was told stent re-occluded. plavix discontinued.  Hypertension  Blood pressure tends to run low - currently not on scheduled blood pressure medication.  Permissive hypertension (OK if < 220/120) but gradually normalize in 5-7 days  Long-term BP goal normotensive  Hyperlipidemia  Home meds: No lipid lowering  medications prior to admission.  LDL 242, goal < 70  On pravastatin now but has statin intolerance hx  Need to consider PCSK9 inhibitors as outpt.  Other Stroke Risk Factors  Advanced age  Obesity, Body mass index is 32.41 kg/m., recommend weight loss, diet and exercise as appropriate   Migraine  Other Active Problems  Aspirin allergy  Hospital day # 2  Neurology will sign off. Please call with questions. Pt will follow up with Dr. Erlinda Hong at [facility] in about 6 weeks. Thanks for the consult.  Rosalin Hawking, MD PhD Stroke Neurology 03/11/2017 8:21 PM   To contact Stroke Continuity provider, please refer to [email or web address] After hours, contact General Neurology

## 2017-03-11 NOTE — Progress Notes (Signed)
Physical Therapy Treatment Patient Details Name: Alyssa Odonnell MRN: [id number] DOB: 26-Mar-1934 Today's Date: 03/11/2017    History of Present Illness Pt is an 83 y/o female admitted secondary to R sided weakness. 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 PMH includes HTN, gaucoma, and R THA.     PT Comments    Emphasis on scanning tasks to the Right.  Pointing out important misses on the Right.  Adding in challenge to balance which worsened the amount of input missed on the Right.  OPPT would be very beneficial.    Follow Up Recommendations  Outpatient PT;Supervision/Assistance - 24 hour     Equipment Recommendations  None recommended by PT    Recommendations for Other Services       Precautions / Restrictions Precautions Precautions: Fall    Mobility  Bed Mobility   Bed Mobility: Supine to Sit;Sit to Supine     Supine to sit: Supervision Sit to supine: Supervision   General bed mobility comments: safety only  Transfers Overall transfer level: Needs assistance Equipment used: None Transfers: Sit to/from Stand Sit to Stand: Supervision         General transfer comment: safety only  Ambulation/Gait Ambulation/Gait assistance: Min guard;Supervision Ambulation Distance (Feet): 200 Feet Assistive device: Rolling walker (2 wheeled);None Gait Pattern/deviations: Step-through pattern Gait velocity: Decreased Gait velocity interpretation: at or above normal speed for age/gender General Gait Details: slower and more guarded without AD   Stairs            Wheelchair Mobility    Modified Rankin (Stroke Patients Only) Modified Rankin (Stroke Patients Only) Pre-Morbid Rankin Score: Slight disability Modified Rankin: Moderately severe disability     Balance Overall balance assessment: Needs assistance   Sitting balance-Leahy Scale: Good       Standing balance-Leahy Scale: Fair Standing balance comment: safer with RW                             Cognition Arousal/Alertness: Awake/alert Behavior During Therapy: WFL for tasks assessed/performed Overall Cognitive Status: Within Functional Limits for tasks assessed                       Memory: Decreased short-term memory                Exercises      General Comments General comments (skin integrity, edema, etc.): Worked on scanning activity up/down the halls pointing out and describing picture and reading signs with cues for missed objects.  Added in balance challenge for half the session with noticeable increase in missed input on the RW.      Pertinent Vitals/Pain Pain Assessment: No/denies pain    Home Living                      Prior Function            PT Goals (current goals can now be found in the care plan section) Acute Rehab PT Goals Patient Stated Goal: to get better  PT Goal Formulation: With patient Time For Goal Achievement: 03/24/17 Potential to Achieve Goals: Good Progress towards PT goals: Progressing toward goals    Frequency    Min 4X/week      PT Plan Current plan remains appropriate    Co-evaluation              AM-PAC PT "6  Clicks" Daily Activity  Outcome Measure  Difficulty turning over in bed (including adjusting bedclothes, sheets and blankets)?: None Difficulty moving from lying on back to sitting on the side of the bed? : A Little Difficulty sitting down on and standing up from a chair with arms (e.g., wheelchair, bedside commode, etc,.)?: A Little Help needed moving to and from a bed to chair (including a wheelchair)?: A Little Help needed walking in hospital room?: A Little Help needed climbing 3-5 steps with a railing? : A Little 6 Click Score: 19    End of Session   Activity Tolerance: Patient tolerated treatment well Patient left: in bed;with call bell/phone within reach;with bed alarm set;Other (comment) Nurse Communication: Mobility status PT Visit  Diagnosis: Other abnormalities of gait and mobility (R26.89);Other symptoms and signs involving the nervous system (R29.898)     Time: 1350-1415 PT Time Calculation (min) (ACUTE ONLY): 25 min  Charges:  $Gait Training: 8-22 mins $Therapeutic Activity: 8-22 mins                    G Codes:       Apr 02, 2017  Donnella Sham, PT [phone]  (pager)   Tessie Fass Dorena Dorfman 04-02-2017, 2:57 PM

## 2017-03-11 NOTE — Interval H&P Note (Signed)
History and Physical Interval Note:  03/11/2017 8:02 AM  Alyssa Odonnell  has presented today for surgery, with the diagnosis of STROKE  The various methods of treatment have been discussed with the patient and family. After consideration of risks, benefits and other options for treatment, the patient has consented to  Procedure(s): TRANSESOPHAGEAL ECHOCARDIOGRAM (TEE) (N/A) as a surgical intervention .  The patient's history has been reviewed, patient examined, no change in status, stable for surgery.  I have reviewed the patient's chart and labs.  Questions were answered to the patient's satisfaction.     Mertie Moores

## 2017-03-11 NOTE — CV Procedure (Addendum)
    Transesophageal Echocardiogram Note  LATISSA FRICK [id number] 05-Apr-1934  Procedure: Transesophageal Echocardiogram Indications: CVA  Procedure Details Consent: Obtained Time Out: Verified patient identification, verified procedure, site/side was marked, verified correct patient position, special equipment/implants available, Radiology Safety Procedures followed,  medications/allergies/relevent history reviewed, required imaging and test results available.  Performed  Medications:  During this procedure the patient is administered a total of Versed 5 mg  mg and Fentanyl 50  mcg  to achieve and maintain moderate conscious sedation.  The patient's heart rate, blood pressure, and oxygen saturation are monitored continuously during the procedure. The period of conscious sedation is 30 minutes, of which I was present face-to-face 100% of this time.  The patient has an apparent stricture in her esophagus at 30 cm.   I was not able to pass the TEE probe far enough to get adequate images.   Left Ventrical:  Poorly visualized   Mitral Valve: no obvious abnormalities   Aortic Valve: thickend, no AI   Tricuspid Valve: not visualized   Pulmonic Valve: not visualized   Left Atrium/ Left atrial appendage: not visualized   Atrial septum: poorly visualized.   Bubble contrast study was negative for shunting   Aorta: mild atherosclerosis    Complications: No apparent complications.   We were not able to pass the probe far enough into the esophagus to get adequate images.    Patient did tolerate procedure well.   Thayer Headings, Brooke Bonito., MD, [facility] 03/11/2017, 9:47 AM

## 2017-03-11 NOTE — Progress Notes (Signed)
Occupational Therapy Treatment Patient Details Name: Alyssa Odonnell MRN: [id number] DOB: [birth year] Today's Date: 03/11/2017    History of present illness Pt is an 83 y/o female admitted secondary to R sided weakness. 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 PMH includes HTN, gaucoma, and R THA.    OT comments  Pt progressing towards goals, completing functional mobility with MinGuard assist and ADLs with MinGuard Assit. Min verbal cues for increasing attention to R visual field during mobility and ADL task completion. Pt will benefit from continued OT services to maximize safety and independence with ADLs and functional mobility. Will continue to follow.    Follow Up Recommendations  Outpatient OT;Supervision/Assistance - 24 hour (outpatient neuro )    Equipment Recommendations  None recommended by OT          Precautions / Restrictions Precautions Precautions: Fall Restrictions Weight Bearing Restrictions: No       Mobility Bed Mobility Overal bed mobility: Needs Assistance Bed Mobility: Supine to Sit;Sit to Supine     Supine to sit: Supervision Sit to supine: Supervision   General bed mobility comments: safety only  Transfers Overall transfer level: Needs assistance Equipment used: Rolling walker (2 wheeled) Transfers: Sit to/from Stand Sit to Stand: Supervision         General transfer comment: safety only    Balance Overall balance assessment: Needs assistance Sitting-balance support: No upper extremity supported;Feet supported Sitting balance-Leahy Scale: Good     Standing balance support: Bilateral upper extremity supported;No upper extremity supported Standing balance-Leahy Scale: Fair Standing balance comment: safer with RW                           ADL either performed or assessed with clinical judgement   ADL Overall ADL's : Needs assistance/impaired Eating/Feeding: Independent;Sitting   Grooming:  Wash/dry hands;Oral care;Min guard;Standing               Lower Body Dressing: Sit to/from stand;Minimal assistance   Toilet Transfer: Min guard;Ambulation;BSC;RW Toilet Transfer Details (indicate cue type and reason): BSC over toilet  Toileting- Clothing Manipulation and Hygiene: Min guard;Sit to/from stand       Functional mobility during ADLs: Min guard;Rolling walker General ADL Comments: Completed room and hallway level functional mobility, Min verbal cues during session to increase attention to R side during functional mobility and ADLs (Pt not completely clearing doorway, BSC when turning to sit), able to self correct.      Vision   Additional Comments: continues to require min verbal cues to increase awareness of R visual field during mobility and ADLs.               Cognition Arousal/Alertness: Awake/alert Behavior During Therapy: WFL for tasks assessed/performed Overall Cognitive Status: Within Functional Limits for tasks assessed                       Memory: Decreased short-term memory                              General Comments     Pertinent Vitals/ Pain       Pain Assessment: No/denies pain  Home Living  Frequency  Min 2X/week        Progress Toward Goals  OT Goals(current goals can now be found in the care plan section)  Progress towards OT goals: Progressing toward goals  Acute Rehab OT Goals Patient Stated Goal: to get better  OT Goal Formulation: With patient Time For Goal Achievement: 03/24/17 Potential to Achieve Goals: Good  Plan Discharge plan remains appropriate    AM-PAC PT "6 Clicks" Daily Activity     Outcome Measure   Help from another person eating meals?: None Help from another person taking care of personal grooming?: A Little Help from another person toileting, which includes using toliet, bedpan, or urinal?: A  Little Help from another person bathing (including washing, rinsing, drying)?: A Little Help from another person to put on and taking off regular upper body clothing?: A Little Help from another person to put on and taking off regular lower body clothing?: A Little 6 Click Score: 19    End of Session Equipment Utilized During Treatment: Gait belt;Rolling walker  OT Visit Diagnosis: Muscle weakness (generalized) (M62.81);Other symptoms and signs involving the nervous system (R29.898);Unsteadiness on feet (R26.81)   Activity Tolerance Patient tolerated treatment well   Patient Left in bed;with call bell/phone within reach   Nurse Communication Mobility status        Time: 7893-8101 OT Time Calculation (min): 21 min  Charges: OT General Charges $OT Visit: 1 Procedure OT Treatments $Self Care/Home Management : 8-22 mins  Alyssa Odonnell, OT Pager [phone] 03/11/2017    Alyssa Odonnell 03/11/2017, 5:23 PM

## 2017-03-11 NOTE — H&P (View-Only) (Signed)
STROKE TEAM PROGRESS NOTE   HISTORY OF PRESENT ILLNESS (per record) Alyssa Odonnell is a 83 y.o. Caucasian female with PMH of HTN, HLD, migraine equivalent, right femoral emboli s/p stent by Dr. Scot Dock off plavix presented as code stroke.  She stated that she has had migraine visual aura for 20-30 years, she did not have HA but has visual disturbance 3-4 times a year and lasting no more than one day. However, for the last 2-3 weeks she started to have visual disturbance again with funny shapes, missing words, etc, but never went away. For the last 1-2 weeks, the visual disturbance constant and stayed with her. Today she went to her garden and felt right arm numbness and right leg mild weakness around 9:30am, she went back in house and had shower but symptoms continue. She also felt slurry speech. EMS called and she was sent her for code stroke. CT showed left PCA infarct likely subacute.  She had right toe discoloration 06/2016 and seen by Dr. Scot Dock, found to have right femoral artery 90% stenosis, felt to be embolic event. She had stent placed. She was put on plavix. On her last follow up with Dr. Scot Dock 10/2016 and she was told that her stent had occluded and she was taken off plavix. She is also allergic to ASA.   She was on statin for HLD but she can not tolerate it due to joint pain and edema. She takes benica for HTN but her BP never high as per pt.   LSN: 2-3 weeks ago  SUBJECTIVE (INTERVAL HISTORY) Her RN is at the bedside.  Pt still has right hemianopia. Educated her on no driving. She agreed. MRI showed left PCA infarct. Will do TEE and loop.    OBJECTIVE Temp:  [97.9 F (36.6 C)-98.4 F (36.9 C)] 98.1 F (36.7 C) (05/23 0505) Pulse Rate:  [62-88] 73 (05/23 0505) Cardiac Rhythm: Normal sinus rhythm (05/22 1900) Resp:  [14-22] 18 (05/23 0505) BP: (102-128)/(54-78) 102/74 (05/23 0505) SpO2:  [95 %-100 %] 98 % (05/23 0505) Weight:  [83 kg (182 lb 15.7 oz)] 83 kg (182 lb 15.7  oz) (05/22 1242)  CBC:  Recent Labs Lab 03/09/17 1215 03/09/17 1230 03/10/17 0718  WBC 9.4  --  6.5  NEUTROABS 5.9  --   --   HGB 13.3 13.6 12.0  HCT 41.3 40.0 38.5  MCV 95.2  --  96.0  PLT 212  --  308    Basic Metabolic Panel:  Recent Labs Lab 03/09/17 1215 03/09/17 1230  NA 139 139  K 4.8 4.7  CL 107 107  CO2 22  --   GLUCOSE 118* 116*  BUN 35* 40*  CREATININE 1.55* 1.50*  CALCIUM 9.8  --     Lipid Panel:    Component Value Date/Time   CHOL 319 (H) 03/09/2017 1932   TRIG 162 (H) 03/09/2017 1932   HDL 45 03/09/2017 1932   CHOLHDL 7.1 03/09/2017 1932   VLDL 32 03/09/2017 1932   LDLCALC 242 (H) 03/09/2017 1932   HgbA1c:  Lab Results  Component Value Date   HGBA1C 5.6 03/09/2017   Urine Drug Screen: No results found for: LABOPIA, COCAINSCRNUR, LABBENZ, AMPHETMU, THCU, LABBARB  Alcohol Level No results found for: ETH  IMAGING  Ct Angio Head W Or Wo Contrast Ct Angio Neck W Or Wo Contrast 03/09/2017 1. Left proximal P2 occlusion with weak downstream reconstitution.  2. Moderate atheromatous narrowing at the left V4 segment.  3. Atherosclerosis without flow  limiting stenosis in the anterior circulation.   Mr Brain Wo Contrast 03/09/2017 Acute to subacute nonhemorrhagic LEFT PCA territory infarct. Moderate chronic small vessel ischemic disease and old RIGHT basal ganglia infarct.  Ct Head Code Stroke W/o Cm 03/09/2017 1. Moderate infarct in the left occipital lobe correlating with history of subacute visual symptoms. No hemorrhagic conversion.  2. No visible MCA or ACA cortical infarct in this patient with weakness.  3. Moderate chronic small vessel ischemia.  TTE  Left ventricle: The cavity size was normal. Systolic function was   normal. The estimated ejection fraction was in the range of 60%   to 65%. Wall motion was normal; there were no regional wall   motion abnormalities. Left ventricular diastolic function   parameters were normal. - Aortic  valve: There was very mild stenosis. There was no   regurgitation. Peak velocity (S): 211 cm/s. Mean gradient (S): 10   mm Hg. Valve area (VTI): 1.49 cm^2. Valve area (Vmax): 1.36 cm^2.   Valve area (Vmean): 1.34 cm^2. - Mitral valve: Transvalvular velocity was within the normal range.   There was no evidence for stenosis. There was no regurgitation. - Right ventricle: The cavity size was normal. Wall thickness was   normal. Systolic function was normal. - Atrial septum: No defect or patent foramen ovale was identified   by color flow Doppler. - Tricuspid valve: There was mild regurgitation. - Pulmonary arteries: Systolic pressure was within the normal   range. PA peak pressure: 23 mm Hg (S).  LE venous doppler - No evidence of DVT or baker's cyst.  TEE pending   PHYSICAL EXAM  Temp:  [98.1 F (36.7 C)-98.5 F (36.9 C)] 98.5 F (36.9 C) (05/23 2119) Pulse Rate:  [62-96] 95 (05/23 2119) Resp:  [16-20] 20 (05/23 2119) BP: (96-127)/(56-74) 121/56 (05/23 2119) SpO2:  [95 %-99 %] 99 % (05/23 2119)  Mental Status -  Level of arousal and orientation to time, place, and person were intact. Language including expression, naming, repetition, comprehension, reading, and writing was assessed and found intact. Fund of Knowledge was assessed and was intact.  Cranial Nerves II - XII - II - right homonymous hemianopia. III, IV, VI - Extraocular movements intact. V - Facial sensation intact bilaterally. VII - mild left nasolabial fold flattening. VIII - Hearing & vestibular intact bilaterally. X - Palate elevates symmetrically, mild dysarthria. XI - Chin turning & shoulder shrug intact bilaterally. XII - Tongue protrusion intact.  Motor Strength - The patient's strength was normal in all extremities except mild right hand dexterity difficulty and pronator drift was absent.   Motor Tone & Bulk - Muscle tone was assessed at the neck and appendages and was normal.  Bulk was normal and  fasciculations were absent.   Reflexes - The patient's reflexes were normal in all extremities and she had no pathological reflexes.  Sensory - Light touch, temperature/pinprick were assessed and were normal.    Coordination - The patient had normal movements in the hands with no ataxia or dysmetria.  Tremor was absent.  Gait and Station - deferred   ASSESSMENT/PLAN [name] is a 83 y.o. female with history of hyperlipidemia, migraine equivalent, right femoral artery stenosis status post stenting with subsequent occlusion, hypertension, hypothyroidism, and glaucoma presenting with right arm numbness and right leg mild weakness around 9:30am, she went back in house and had shower but symptoms continue. She also felt slurry speech. She did not receive IV t-PA due to late presentation.  Stroke:  Left PCA infarct, possibly embolic - source unknown.  Resultant  Right hemianopia  CT head - Moderate infarct in the left occipital lobe  MRI head - Acute to subacute nonhemorrhagic Lt PCA territory infarct. Old RIGHT basal ganglia infarct.  CTA H&N - Left proximal P2 occlusion  2D Echo - EF 60-65%  Recommend TEE/loop to evaluate cardiac source  LDL - 242  HgbA1c - 5.6  VTE prophylaxis - Lovenox  Diet Heart Room service appropriate? Yes; Fluid consistency: Thin  clopidogrel 75 mg daily prior to admission, now on clopidogrel 75 mg daily. Pt has ASA allergy.   Patient counseled to be compliant with her antithrombotic medications  Ongoing aggressive stroke risk factor management  Therapy recommendations:  Outpatient PT and OT recommended.  Disposition:  Pending  Hx of right femoral artery thrombosis  06/2016 with Dr. Scot Dock, concerning for embolic event  Follows with Dr. Scot Dock, ASA stopped and on plavix  Had follow up in 10/2016 with [facility] and was told stent re-occluded. plavix discontinued.  Hypertension  Blood pressure tends to run low - currently not on  scheduled blood pressure medication.  Permissive hypertension (OK if < 220/120) but gradually normalize in 5-7 days  Long-term BP goal normotensive  Hyperlipidemia  Home meds: No lipid lowering medications prior to admission.  LDL 242, goal < 70  Statin allergy - consider PCSK9 inhibitors.  Other Stroke Risk Factors  Advanced age  Obesity, Body mass index is 32.41 kg/m., recommend weight loss, diet and exercise as appropriate   Migraine  Other Active Problems  Aspirin allergy  Hospital day # 1  Rosalin Hawking, MD PhD Stroke Neurology 03/10/2017 10:50 PM    To contact Stroke Continuity provider, please refer to [email or web address] After hours, contact General Neurology

## 2017-03-11 NOTE — Progress Notes (Signed)
Transitions of Care Pharmacy Note  Plan:  Educated on new pravastatin and gabapentin  Addressed concerns regarding blood pressure medication  Inpatient follow-up: PTA Benicar HCT plan at discharge  Outpatient follow-up: adverse effects from pravastatin, BP, adherence  --------------------------------------------- Alyssa Odonnell is an 83 y.o. female who presents with a chief complaint of weakness. In anticipation of discharge, pharmacy has reviewed this patient's prior to admission medication history, as well as current inpatient medications listed per the [facility].  Current medication indications, dosing, frequency, and notable side effects reviewed with patient. patient verbalized understanding of current inpatient medication regimen and is aware that the After Visit Summary when presented, will represent the most accurate medication list at discharge.   Alyssa Odonnell expressed concerns regarding her blood pressure medication, which has been held inpt for permissive hypertension. I informed the patient that the primary team would make a decision about when to resume her BP medications and would provide instructions on her D/C AVS. We also talked about her new pravastatin prescription. With her history of muscle tightness r/t statins, I encouraged her to call her MD if she experiences any of these side effects. Lastly, we discussed new gabapentin. I informed her that it can cause drowsiness. The patient reports no further questions or concerns at the conclusion of our visit.   Assessment: Understanding of regimen: good Understanding of indications: good Potential of compliance: good  Patient instructed to contact inpatient pharmacy team with further questions or concerns if needed.    Time spent preparing for discharge counseling: 10 min  Time spent counseling patient: 15 min    Alyssa Odonnell, PharmD Pharmacy Resident  Pager [phone] 03/11/17 2:25 PM

## 2017-03-11 NOTE — Progress Notes (Signed)
Patient at procedure or test/unavailable. ST will continue efforts   Arvil Chaco MA, [facility] Speech Language Pathologist

## 2017-03-11 NOTE — Care Management Note (Signed)
Case Management Note  Patient Details  Name: Alyssa Odonnell MRN: [id number] Date of Birth: [birth year]  Subjective/Objective:                    Action/Plan: Pt discharging home with outpatient therapy. CM met with the patient and she is interested in attending at [facility]. Orders in EPIC and information on the AVS. Pt has transportation home.   Expected Discharge Date:  03/11/17               Expected Discharge Plan:  OP Rehab  In-House Referral:     Discharge planning Services  CM Consult  Post Acute Care Choice:    Choice offered to:     DME Arranged:    DME Agency:     HH Arranged:    HH Agency:     Status of Service:  Completed, signed off  If discussed at [employer] of Stay Meetings, dates discussed:    Additional Comments:  Pollie Friar, RN 03/11/2017, 5:17 PM

## 2017-03-11 NOTE — Consult Note (Signed)
ELECTROPHYSIOLOGY CONSULT NOTE  Patient ID: Alyssa Odonnell MRN: [id number], DOB/AGE: [birth year]   Admit date: 03/09/2017 Date of Consult: 03/11/2017  Primary Physician: Drake Leach, MD Primary Cardiologist: new to [facility] Reason for Consultation: Cryptogenic stroke; recommendations regarding Implantable Loop Recorder  History of Present Illness Ramiro Harvest referred by Dr Erlinda Hong for evaluation of ILR placement in setting of cryptogenic stroke. She was admitted on 03/09/2017 with right arm numbness and right leg weakness.  They first developed symptoms while in the garden the day of admission.  Imaging demonstrated left PCA infarct felt to be embolic 2/2 unknown source.  She has undergone workup for stroke including echocardiogram and carotid dopplers.  The patient has been monitored on telemetry which has demonstrated sinus rhythm with no arrhythmias.  Inpatient stroke work-up is to be completed with a TEE.   Echocardiogram this admission demonstrated EF 60-65%, no RWMA, LA 24.  Lab work is reviewed.  Prior to admission, the patient denies chest pain, shortness of breath, dizziness, palpitations, or syncope.  They are recovering from their stroke with plans to return home at discharge.  EP has been asked to evaluate for placement of an implantable loop recorder to monitor for atrial fibrillation.   Past Medical History:  Diagnosis Date  . Glaucoma   . Hyperlipidemia   . Hypertension   . Hypothyroid      Surgical History:  Past Surgical History:  Procedure Laterality Date  . ABDOMINAL HERNIA REPAIR    . PERIPHERAL VASCULAR CATHETERIZATION Right 07/06/2016   Procedure: Lower Extremity Angiography;  Surgeon: Angelia Mould, MD;  Location: [facility];  Service: Cardiovascular;  Laterality: Right;  . PERIPHERAL VASCULAR CATHETERIZATION Right 07/06/2016   Procedure: Peripheral Vascular Intervention;  Surgeon: Angelia Mould, MD;  Location: [facility];   Service: Cardiovascular;  Laterality: Right;  Superficial femoral  . TOTAL HIP ARTHROPLASTY       Prescriptions Prior to Admission  Medication Sig Dispense Refill Last Dose  . acetaminophen (TYLENOL) 500 MG tablet Take 1,000 mg by mouth every morning.    03/09/2017 at am  . latanoprost (XALATAN) 0.005 % ophthalmic solution Place 1 drop into both eyes at bedtime.   03/08/2017 at pm  . levothyroxine (SYNTHROID, LEVOTHROID) 100 MCG tablet Take 100 mcg by mouth daily before breakfast.    03/09/2017 at am  . olmesartan-hydrochlorothiazide (BENICAR HCT) 20-12.5 MG tablet Take 1 tablet by mouth daily.   03/09/2017 at am  . omeprazole (PRILOSEC) 20 MG capsule Take 20 mg by mouth daily before breakfast.   03/09/2017 at am  . sertraline (ZOLOFT) 100 MG tablet Take 150 mg by mouth every morning.   03/09/2017 at am  . triamcinolone cream (KENALOG) 0.1 % Apply 1 application topically daily as needed for itching.   Past Week at Unknown time  . clopidogrel (PLAVIX) 75 MG tablet Take 1 tablet (75 mg total) by mouth daily. (Patient not taking: Reported on 03/09/2017) 30 tablet 5 Not Taking at Unknown time  . furosemide (LASIX) 40 MG tablet Take 40 mg by mouth daily as needed for edema. (swelling in feet)   Unk at Unk  . ondansetron (ZOFRAN ODT) 4 MG disintegrating tablet Take 1 tablet (4 mg total) by mouth every 8 (eight) hours as needed for nausea or vomiting. (Patient not taking: Reported on 03/09/2017) 10 tablet 0 Not Taking at Unknown time    Inpatient Medications:  . clopidogrel  75 mg Oral Daily  . docusate  sodium  100 mg Oral BID  . enoxaparin (LOVENOX) injection  40 mg Subcutaneous Daily  . gabapentin  300 mg Oral QHS  . latanoprost  1 drop Both Eyes QHS  . levothyroxine  100 mcg Oral QAC breakfast  . pantoprazole  40 mg Oral Daily  . pravastatin  20 mg Oral q1800  . sertraline  150 mg Oral q morning - 10a  . sodium chloride flush  3 mL Intravenous Q12H    Allergies:  Allergies  Allergen Reactions    . Aspirin Other (See Comments)    Red splotches  . Atorvastatin Other (See Comments)    Muscle spams  . Nabumetone Itching  . Nsaids     Reaction (??)  . Procaine Swelling  . Rosuvastatin Nausea Only and Other (See Comments)    Reflux and Muscle stiffness  . Simvastatin Nausea Only and Other (See Comments)    Reflux and Muscle stiffness  . Mobic [Meloxicam] Rash  . Penicillins Rash    Has patient had a PCN reaction causing immediate rash, facial/tongue/throat swelling, SOB or lightheadedness with hypotension: Yes Has patient had a PCN reaction causing severe rash involving mucus membranes or skin necrosis: No Has patient had a PCN reaction that required hospitalization: No Has patient had a PCN reaction occurring within the last 10 years: No If all of the above answers are "NO", then may proceed with Cephalosporin use.      Social History   Social History  . Marital status: Widowed    Spouse name: N/A  . Number of children: N/A  . Years of education: N/A   Occupational History  . Not on file.   Social History Main Topics  . Smoking status: Never Smoker  . Smokeless tobacco: Never Used  . Alcohol use No  . Drug use: Unknown  . Sexual activity: Not on file   Other Topics Concern  . Not on file   Social History Narrative  . No narrative on file     Family History: no premature CAD     Review of Systems: All other systems reviewed and are otherwise negative except as noted above.  Physical Exam: Vitals:   03/10/17 1726 03/10/17 2119 03/11/17 0100 03/11/17 0643  BP: 127/60 (!) 121/56 125/60 (!) 115/52  Pulse: 96 95 86 72  Resp: 20 20 20 20   Temp: 98.2 F (36.8 C) 98.5 F (36.9 C) 98.2 F (36.8 C) 98 F (36.7 C)  TempSrc: Oral Oral Oral Oral  SpO2: 99% 99% 99% 99%  Weight:      Height:        GEN- The patient is elderly appearing, alert and oriented x 3 today.   Head- normocephalic, atraumatic Eyes-  Sclera clear, conjunctiva pink Ears- hearing  intact Oropharynx- clear Neck- supple Lungs- Clear to ausculation bilaterally, normal work of breathing Heart- Regular rate and rhythm  GI- soft, NT, ND, + BS Extremities- no clubbing, cyanosis, or edema MS- no significant deformity or atrophy Skin- no rash or lesion Psych- euthymic mood, full affect   Labs:   Lab Results  Component Value Date   WBC 6.4 03/11/2017   HGB 11.6 (L) 03/11/2017   HCT 37.2 03/11/2017   MCV 96.4 03/11/2017   PLT 180 03/11/2017    Recent Labs Lab 03/09/17 1215  03/11/17 0442  NA 139  < > 138  K 4.8  < > 4.7  CL 107  < > 108  CO2 22  < > 21*  BUN 35*  < > 26*  CREATININE 1.55*  < > 1.13*  CALCIUM 9.8  < > 9.1  PROT 7.6  --   --   BILITOT 0.7  --   --   ALKPHOS 87  --   --   ALT 16  --   --   AST 23  --   --   GLUCOSE 118*  < > 104*  < > = values in this interval not displayed. No results found for: CKTOTAL, CKMB, CKMBINDEX, TROPONINI  Radiology/Studies: Ct Angio Head W Or Wo Contrast Result Date: 03/09/2017 CLINICAL DATA:  Left PCA infarct. Visual changes and left arm weakness. EXAM: CT ANGIOGRAPHY HEAD AND NECK TECHNIQUE: Multidetector CT imaging of the head and neck was performed using the standard protocol during bolus administration of intravenous contrast. Multiplanar CT image reconstructions and MIPs were obtained to evaluate the vascular anatomy. Carotid stenosis measurements (when applicable) are obtained utilizing NASCET criteria, using the distal internal carotid diameter as the denominator. CONTRAST:  50 cc Isovue 370 intravenous COMPARISON:  Head CT from earlier today FINDINGS: CTA NECK FINDINGS Aortic arch: Atherosclerosis that is mild.  Three vessel branching. Right carotid system: Moderate calcified plaque at the common carotid bifurcation and ICA bulb without stenosis, ulceration, or dissection. Left carotid system: Moderate predominately noncalcified plaque at the common carotid bifurcation and ICA bulb without flow limiting  stenosis, ulceration, or dissection. Vertebral arteries: No brachiocephalic or proximal subclavian flow limiting stenosis. Left dominant vertebral artery. Both vessels are smooth and widely patent to the dura. Skeleton: Degenerative changes in the cervical spine. No acute or aggressive finding. Other neck: 9 mm left thyroid mass or cyst, considered incidental based on size and demographics. Upper chest: Right base density on scout image is from intrathoracic stomach. Review of the MIP images confirms the above findings CTA HEAD FINDINGS Anterior circulation: Confluent atherosclerotic plaque on the carotid siphons. No branch occlusion or flow limiting stenosis. Negative for aneurysm. Posterior circulation: Mild left vertebral artery dominance. Moderate atheromatous narrowing of the left vertebral artery due to noncalcified plaque just past the dura. The basilar is smooth and widely patent. Dysplastic distal basilar without true saccular aneurysm. Proximal left P2 segment occlusion with intermittent faint downstream reconstitution. Fetal type PCA on the right. Venous sinuses: Patent. Anatomic variants: None other than described above. Delayed phase: No abnormal intracranial enhancement. The left PCA infarct is nonenhancing. Review of the MIP images confirms the above findings IMPRESSION: 1. Left proximal P2 occlusion with weak downstream reconstitution. 2. Moderate atheromatous narrowing at the left V4 segment. 3. Atherosclerosis without flow limiting stenosis in the anterior circulation. Electronically Signed   By: Monte Fantasia M.D.   On: 03/09/2017 14:20   Ct Angio Neck W Or Wo Contrast Result Date: 03/09/2017 CLINICAL DATA:  Left PCA infarct. Visual changes and left arm weakness. EXAM: CT ANGIOGRAPHY HEAD AND NECK TECHNIQUE: Multidetector CT imaging of the head and neck was performed using the standard protocol during bolus administration of intravenous contrast. Multiplanar CT image reconstructions and MIPs  were obtained to evaluate the vascular anatomy. Carotid stenosis measurements (when applicable) are obtained utilizing NASCET criteria, using the distal internal carotid diameter as the denominator. CONTRAST:  50 cc Isovue 370 intravenous COMPARISON:  Head CT from earlier today FINDINGS: CTA NECK FINDINGS Aortic arch: Atherosclerosis that is mild.  Three vessel branching. Right carotid system: Moderate calcified plaque at the common carotid bifurcation and ICA bulb without stenosis, ulceration, or dissection. Left carotid system: Moderate  predominately noncalcified plaque at the common carotid bifurcation and ICA bulb without flow limiting stenosis, ulceration, or dissection. Vertebral arteries: No brachiocephalic or proximal subclavian flow limiting stenosis. Left dominant vertebral artery. Both vessels are smooth and widely patent to the dura. Skeleton: Degenerative changes in the cervical spine. No acute or aggressive finding. Other neck: 9 mm left thyroid mass or cyst, considered incidental based on size and demographics. Upper chest: Right base density on scout image is from intrathoracic stomach. Review of the MIP images confirms the above findings CTA HEAD FINDINGS Anterior circulation: Confluent atherosclerotic plaque on the carotid siphons. No branch occlusion or flow limiting stenosis. Negative for aneurysm. Posterior circulation: Mild left vertebral artery dominance. Moderate atheromatous narrowing of the left vertebral artery due to noncalcified plaque just past the dura. The basilar is smooth and widely patent. Dysplastic distal basilar without true saccular aneurysm. Proximal left P2 segment occlusion with intermittent faint downstream reconstitution. Fetal type PCA on the right. Venous sinuses: Patent. Anatomic variants: None other than described above. Delayed phase: No abnormal intracranial enhancement. The left PCA infarct is nonenhancing. Review of the MIP images confirms the above findings  IMPRESSION: 1. Left proximal P2 occlusion with weak downstream reconstitution. 2. Moderate atheromatous narrowing at the left V4 segment. 3. Atherosclerosis without flow limiting stenosis in the anterior circulation. Electronically Signed   By: Monte Fantasia M.D.   On: 03/09/2017 14:20   12-lead ECG sinus rhythm, rate 73 All prior EKG's in EPIC reviewed with no documented atrial fibrillation  Telemetry sinus rhythm with some ectopic atrial tachycardia  Assessment and Plan:  1. Cryptogenic stroke The patient presents with cryptogenic stroke.  The patient has a TEE planned for later today.  I spoke at length with the patient about monitoring for afib with either an implantable loop recorder.  Risks, benefits, and alteratives to implantable loop recorder were discussed with the patient today.   At this time, the patient is very clear in their decision to proceed with implantable loop recorder.   Wound care was reviewed with the patient (keep incision clean and dry for 3 days).    Please call with questions.   Chanetta Marshall, NP 03/11/2017 7:37 AM  EP Attending  Patient seen and examined. Agree with above. The patient has sustained a cryptogenic stroke. She has undergone TEE which demonstrates no obvious cause for her stroke. I have discussed the indications/risk/benefits/goals/expectations of ILR insertion and she wishes to proceed.  Mikle Bosworth.D.

## 2017-03-11 NOTE — Progress Notes (Signed)
Family Medicine Teaching Service Daily Progress Note Intern Pager: [phone]  Patient name: Alyssa Odonnell Medical record number: [id number] Date of birth: 1934-06-03 Age: 83 y.o. Gender: female  Primary Care Provider: Drake Leach, MD Consultants: Neurology  Code Status: FULL   Pt Overview and Major Events to Date:  Admit 5/22   Assessment and Plan: Alyssa Odonnell is a 83 y.o. female presenting with right sided weakness and R hemianopsia.  PMH is significant for HTN, PAD, Hypothyroidism, HLD, GERD, depression, right femoral emboli s/p stent by Dr. Scot Odonnell (off Plavix), and spinal stenosis with disc herniation.    R sided vision changes and weakness. Improving. Symptoms of weakness have resolved.  LE dopplers negative and echo 60-65%, no regional wall motion abnormalities.  Monitored on telemetry which has demonstrated sinus rhythm with no arrhythmias.  TEE this AM unsuccessful 2/2 strictures.  Patient denies difficulty swallowing or choking.  Reports some mild reflux at times.  May benefit from outpatient GI referral.  -Telemetry  -Neurology following >> EP to place loop recorder today at 1PM.   -Will touch base with cardiology and neurology following loop procedure if cleared for discharge home.               -Permissive hypertension (SBP <180 or DBP <105 for 24-48 h) then gradually normalize over 5-7 days              -Plavix 75 mg daily. Was loaded with 300 mg in ED.  Allergic to aspirin.  -Continue Pravastatin 20 mg daily; patient has not tolerated in the past but may be agreeable  -SLP/OT/PT consult >> recommended neuro outpatient PT  -Neuro checks Q2 x12 hours then space to Q4   -Continuous pulse ox  -Fall precautions  -vitals per unit routine    Memory concerns: Dementia workup ordered -Vit B12 normal, TSH normal, RPR and HIV non-reactive, folate pending  HLD. Recent lipid panel with LDL 241, HDL 51, TG 188, VLDL 38, total chol 330  Patient reports she is not on a statin  medication.  Has tried this in the past and experienced upper extremity stiffness.   -Continue Pravastatin 20 mg daily   PAD.  Reports bilateral leg pain x 1 month. Difficult to assess for calf tenderness secondary as some of her pain may be attributed to varicosities.  Patient with no history of recent immobilization or long distance travel however LE dopplers to rule out clot and were negative.   -Increase Gabapentin 300 mg to TID for pain  -SCDs for now in setting of possible stroke   H/o R femoral artery emboli s/p stent.  Patient with R toe discoloration in 06/2016 and seen by Dr. Scot Odonnell.  Found to have right femoral artery 90% stenosis which appeared to be embolic event. She had a stent placed at that time and was put on Plavix.  On her last follow up with Dr. Scot Odonnell 10/2016 she was told that her stent had occluded and she was taken off plavix. She is also allergic to ASA.   -Plavix as above  Hypothyroidism.  Recent TSH unknown, but TSH on admission wnl.  At home on Synthroid 100 mcg daily.   -Will continue home Synthroid  HTN.  Mildly hypertensive this AM.  At home on Benicar HCT 20-12.5 mg daily.   -Hold antihypertensives for now  -Will allow permissive HTN over next several days  -Hydralazine 5 mg with parameters ordered (SBP>180 or DBP>105) -Monitor blood pressures  GERD.  At home  on Omeprazole 20 mg daily.  -Protonix 40 mg daily   Spinal stenosis with ?bulging disc.  Patient with history of lumbar radiculopathy.  Follows with [facility] for physical therapy.  Reports this is helping and additionally, she takes 2 tabs Tylenol in AM for discomfort.  -Can continue Tylenol as needed for back pain   Depression.  Stable. Reports she has been on Zoloft for the past 10-12 years.    -Continue Zoloft 150 mg daily   FEN/GI: Heart healthy diet, Protonix, SLIV   Prophylaxis: Lovenox  Disposition: Continue to monitor on inpatient service. TEE and Loop recorder today. Possible discharge  after procedure.   Subjective:  Patient able to walk to bathroom without leg weakness.  Continues to have right sided visual disturbances but notes it is less than yesterday.    Objective: Temp:  [98 F (36.7 C)-98.5 F (36.9 C)] 98 F (36.7 C) (05/24 0950) Pulse Rate:  [66-96] 66 (05/24 1020) Resp:  [13-20] 16 (05/24 1020) BP: (94-165)/(52-83) 116/71 (05/24 1020) SpO2:  [93 %-99 %] 94 % (05/24 1020)   Physical Exam: General: Pleasant 83 yo F, NAD, laying in hospital bed  Eyes: EOMI, R hemianopia  ENTM: MMM, o/p clear Neck: supple, no JVD Cardiovascular: RRR,  2/6 SEM over RUSB and LUSB Respiratory: CTAB, comfortable work of breathing on RA  Gastrointestinal: soft, NTND, +BS  MSK: ROM normal Derm: skin is warm and dry  Neuro: Awake, AOx4, CN II-XII grossly intact, right-sided hemianopsia, no facial droop, speech is normal, 5/5 strength bilaterally in upper and lower extremities, sensation intact  Psych: normal affect   Laboratory:  Recent Labs Lab 03/09/17 1215 03/09/17 1230 03/10/17 0718 03/11/17 0442  WBC 9.4  --  6.5 6.4  HGB 13.3 13.6 12.0 11.6*  HCT 41.3 40.0 38.5 37.2  PLT 212  --  183 180    Recent Labs Lab 03/09/17 1215 03/09/17 1230 03/10/17 0718 03/11/17 0442  NA 139 139 140 138  K 4.8 4.7 5.1 4.7  CL 107 107 106 108  CO2 22  --  25 21*  BUN 35* 40* 22* 26*  CREATININE 1.55* 1.50* 1.15* 1.13*  CALCIUM 9.8  --  9.2 9.1  PROT 7.6  --   --   --   BILITOT 0.7  --   --   --   ALKPHOS 87  --   --   --   ALT 16  --   --   --   AST 23  --   --   --   GLUCOSE 118* 116* 97 104*    Imaging/Diagnostic Tests: No results found.  Alyssa Kim, MD 03/11/2017, 12:26 PM PGY-1, [place] Intern pager: [phone], text pages welcome

## 2017-03-12 ENCOUNTER — Telehealth: Payer: Self-pay | Admitting: Internal Medicine

## 2017-03-12 ENCOUNTER — Encounter (HOSPITAL_COMMUNITY): Payer: Self-pay | Admitting: Internal Medicine

## 2017-03-12 NOTE — Telephone Encounter (Signed)
Spoke with pt and informed her that her home monitor is working and that she doesn't have to do anything unless a member of the [facility] Team calls her. Patient verbalized understanding.

## 2017-03-12 NOTE — Telephone Encounter (Signed)
New message    Pt is asking for a call back about her home monitor. They aren't sure if it's sending or if they have it hooked up right.

## 2017-03-14 NOTE — Discharge Summary (Signed)
[facility] Discharge Summary  Patient name: Alyssa Odonnell Medical record number: [id number] Date of birth: 1934-03-23 Age: 83 y.o. Gender: female Date of Admission: 03/09/2017  Date of Discharge: 03/11/2017 Admitting Physician: Alyssa Dawn, MD  Primary Care Provider: Drake Leach, MD Consultants: Neurology  Indication for Hospitalization: R visual disturbances, R sided weakness   Discharge Diagnoses/Problem List:  Left PCA infarct HLD PAD H/o R femoral artery embolus with stent HTN Hypothyroidism GERD Spinal stenosis Depression   Disposition: Home  Discharge Condition: Stable, improved.   Discharge Exam:  General: Pleasant 83 yo F, NAD, laying in hospital bed  Eyes: EOMI, R hemianopia  ENTM: MMM, o/p clear Neck: supple, no JVD Cardiovascular: RRR, 2/6 SEM over RUSB and LUSB Respiratory: CTAB, comfortable work of breathing on RA  Gastrointestinal: soft, NTND, +BS  MSK: ROM normal Derm: skin is warm and dry  Neuro: Awake, AOx4, CN II-XII grossly intact, right-sided hemianopsia,no facial droop, speech is normal, 5/5 strength bilaterally in upper and lower extremities, sensation intact  Psych: normal affect   Brief Hospital Course:   Alyssa Odonnell is an 83 yo F with history of hyperlipidemia, migraine equivalent, right femoral artery stenosis s/p stenting, hypertension, hypothyroidism, and glaucoma presenting with R arm numbness as well as right leg weakness and visual disturbances.  Upon arrival she was not a candidate for tPA as she was outside of the window.  CT head with moderate infarct in the left occipital lobe correlating with history of subacute visual symptoms (right hemianopsia).  CT head/neck revealed L proximal occlusion and atherosclerosis.  Neurology was consulted in the ED and she was loaded with Plavix. She was admitted to [facility] for stroke workup.   Embolic source suspected given history of right femoral artery embolus in 2016.   Home antihypertensives were held and permissive hypertension was allowed.  Neurology recommended TEE/loop recorder to evaluate for cardiac source.  LE dopplers negative for DVT and echo with EF 60-65%.  Of note, patient not on a statin medication secondary to muscle pain and soreness in the past.   She was agreeable to starting a low dose Pravastatin 20 mg in addition to daily Plavix. 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  A loop recorder was placed prior to discharge and patient to follow up outpatient.   PT/OT recommended neuro outpatient PT.   Additionally, patient complaining of recent memory concerns.  Dementia workup labs were ordered and found to be within normal limits.    At time of discharge, patient reported weakness had resolved and visual symptoms  had improved.  She was discharged home in stable condition and follow-up appointments were made.   Issues for Follow Up:  1.  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  Patient denies symptoms of dysphagia or food sticking.  Would consider GI as outpatient to evaluate this.   2. Will follow up with Neurology outpatient - Dr. Erlinda Odonnell at [facility] in ~6 weeks.  3.  Patient not on a statin prior to admission due to history of statin intolerance. LDL 242.  She was agreeable to starting Pravastatin 20 mg daily with this admission.  Would consider PCSK9 inhibitors as outpatient.   4.  Would recommend outpatient follow up with ophthalmology for her.  5.  Patient reported recent concerns with her memory.  Dementia workup normal.  Would recommend further evaluation as outpatient.    Significant Procedures: TEE, loop recorder placement   Significant Labs and Imaging:   Recent Labs Lab 03/09/17 1215 03/09/17 1230 03/10/17 0718 03/10/17 1237 03/11/17 0442  WBC 9.4  --  6.5  --  6.4  HGB 13.3 13.6 12.0  --  11.6*  HCT 41.3 40.0  38.5 37.9 37.2  PLT 212  --  183  --  180    Recent Labs Lab 03/09/17 1215 03/09/17 1230 03/10/17 0718 03/11/17 0442  NA 139 139 140 138  K 4.8 4.7 5.1 4.7  CL 107 107 106 108  CO2 22  --  25 21*  GLUCOSE 118* 116* 97 104*  BUN 35* 40* 22* 26*  CREATININE 1.55* 1.50* 1.15* 1.13*  CALCIUM 9.8  --  9.2 9.1  ALKPHOS 87  --   --   --   AST 23  --   --   --   ALT 16  --   --   --   ALBUMIN 4.3  --   --   --    Results/Tests Pending at Time of Discharge: None  Discharge Medications:  Allergies as of 03/11/2017      Reactions   Aspirin Other (See Comments)   Red splotches   Atorvastatin Other (See Comments)   Muscle spams   Nabumetone Itching   Nsaids    Reaction (??)   Procaine Swelling   Rosuvastatin Nausea Only, Other (See Comments)   Reflux and Muscle stiffness   Simvastatin Nausea Only, Other (See Comments)   Reflux and Muscle stiffness   Mobic [meloxicam] Rash   Penicillins Rash   Has patient had a PCN reaction causing immediate rash, facial/tongue/throat swelling, SOB or lightheadedness with hypotension: Yes Has patient had a PCN reaction causing severe rash involving mucus membranes or skin necrosis: No Has patient had a PCN reaction that required hospitalization: No Has patient had a PCN reaction occurring within the last 10 years: No If all of the above answers are "NO", then may proceed with Cephalosporin use.      Medication List    TAKE these medications   acetaminophen 500 MG tablet Commonly known as:  TYLENOL Take 1,000 mg by mouth every morning.   clopidogrel 75 MG tablet Commonly known as:  PLAVIX Take 1 tablet (75 mg total) by mouth daily.   furosemide 40 MG tablet Commonly known as:  LASIX Take 40 mg by mouth daily as needed for edema. (swelling in feet)   gabapentin 300 MG capsule Commonly known as:  NEURONTIN Take 1 capsule (300 mg total) by mouth 3 (three) times daily.   latanoprost 0.005 % ophthalmic solution Commonly known as:   XALATAN Place 1 drop into both eyes at bedtime.   levothyroxine 100 MCG tablet Commonly known as:  SYNTHROID, LEVOTHROID Take 100 mcg by mouth daily before breakfast.   olmesartan-hydrochlorothiazide 20-12.5 MG tablet Commonly known as:  BENICAR HCT Take 1 tablet by mouth daily.   omeprazole 20 MG capsule Commonly known as:  PRILOSEC Take 20 mg by mouth daily before breakfast.   ondansetron 4 MG disintegrating tablet Commonly known as:  ZOFRAN ODT Take 1 tablet (4 mg total) by mouth every 8 (eight) hours as needed for nausea or vomiting.   pravastatin 20 MG tablet Commonly known as:  PRAVACHOL Take 1 tablet (20 mg total) by mouth daily at 6 PM.   sertraline 100 MG tablet Commonly known as:  ZOLOFT Take 150  mg by mouth every morning.   triamcinolone cream 0.1 % Commonly known as:  KENALOG Apply 1 application topically daily as needed for itching.      Discharge Instructions: Please refer to Patient Instructions section of EMR for full details.  Patient was counseled important signs and symptoms that should prompt return to medical care, changes in medications, dietary instructions, activity restrictions, and follow up appointments.   Follow-Up Appointments: Follow-up Information    Alyssa Leach, MD. Schedule an appointment as soon as possible for a visit in 1 week(s).   Specialty:  Internal Medicine Contact information: [address] [place] [address] [phone]        [facility] Follow up.   Specialty:  Rehabilitation Why:  They will contact you for the first appointment. Contact information: [address] [place] [phone] [place]       Rosalin Hawking, MD. Schedule an appointment as soon as possible for a visit in 6 week(s).   Specialty:  Neurology Contact information: [address] [place] [address] [phone]          Lovenia Kim, MD 03/14/2017, 12:40 AM PGY-1, [place]

## 2017-03-16 ENCOUNTER — Other Ambulatory Visit: Payer: Self-pay | Admitting: *Deleted

## 2017-03-16 NOTE — Patient Outreach (Signed)
[facility]) Care Management  03/16/2017  RAUSHANAH OSMUNDSON Mar 05, 1934 [id number]  EMMI-Stroke referral for red dashboard for problem setting up Rehab:  Telephone call to patient; HIPPA verification received from patient.  Patient states she does not remember taking the call, but does voice she is not sure if she will get rehab or not.  Advised patient to review her discharge instructions with her caregiver to get more information. This RN care coordinator was able to review discharge instructions for patient  & advised her that there were instructions for her that outpatient rehab office will be calling her. She advised that she had not received any calls. Rehab center phone number at [facility] neurological as well as follow up neurologist  number was given to patient. She voices that she will call both offices to get appointments times for both visits. States she attended hospital follow up appointment today with  primary care provider. States daughter is providing transportation to MD appointments.   Patient voices that her visual disturbance to right eye has not resolved. States she does have difficulty seeing when attempting to read. States she will have follow up appointment with eye specialist. Patient voices that she has educational literature on stroke symptoms & now knows more signs of stroke & understands importance of calling 911.  States she has all of prescribed medications & is taking as ordered.  No further concerns at this time.   Plan: Send educational information as means of re-enforcement & review for patient.  Send to care management assistant for case closure.     Sherrin Daisy, RN BSN [place] Management Coordinator [facility] Care Management  [phone]

## 2017-03-17 ENCOUNTER — Encounter: Payer: Self-pay | Admitting: *Deleted

## 2017-03-22 ENCOUNTER — Encounter: Payer: Self-pay | Admitting: Occupational Therapy

## 2017-03-22 ENCOUNTER — Encounter: Payer: Self-pay | Admitting: Physical Therapy

## 2017-03-22 ENCOUNTER — Ambulatory Visit: Payer: Medicare Other | Admitting: Physical Therapy

## 2017-03-22 ENCOUNTER — Ambulatory Visit: Payer: Medicare Other | Attending: Family Medicine | Admitting: Occupational Therapy

## 2017-03-22 ENCOUNTER — Telehealth: Payer: Self-pay | Admitting: Occupational Therapy

## 2017-03-22 VITALS — BP 134/76 | HR 80

## 2017-03-22 VITALS — BP 140/86

## 2017-03-22 DIAGNOSIS — M6281 Muscle weakness (generalized): Secondary | ICD-10-CM

## 2017-03-22 DIAGNOSIS — R2681 Unsteadiness on feet: Secondary | ICD-10-CM

## 2017-03-22 DIAGNOSIS — R2689 Other abnormalities of gait and mobility: Secondary | ICD-10-CM | POA: Diagnosis present

## 2017-03-22 DIAGNOSIS — I63432 Cerebral infarction due to embolism of left posterior cerebral artery: Secondary | ICD-10-CM

## 2017-03-22 DIAGNOSIS — I69818 Other symptoms and signs involving cognitive functions following other cerebrovascular disease: Secondary | ICD-10-CM

## 2017-03-22 DIAGNOSIS — R41842 Visuospatial deficit: Secondary | ICD-10-CM | POA: Insufficient documentation

## 2017-03-22 NOTE — Therapy (Signed)
[facility] [address] [place], [place], [address] Phone: [phone]   Fax:  [phone]  Physical Therapy Evaluation  Patient Details  Name: Alyssa Odonnell MRN: [id number] Date of Birth: November 28, 1933 Referring Provider: Rosalin Hawking MD   Encounter Date: 03/22/2017      PT End of Session - 03/22/17 1511    Visit Number 1   Number of Visits 18   Date for PT Re-Evaluation 05/21/17   Authorization Type Medicare & G-codes every 10th visit    PT Start Time 1400   PT Stop Time 1445   PT Time Calculation (min) 45 min   Equipment Utilized During Treatment Gait belt   Activity Tolerance Patient tolerated treatment well   Behavior During Therapy [facility] for tasks assessed/performed      Past Medical History:  Diagnosis Date  . Glaucoma   . Hyperlipidemia   . Hypertension   . Hypothyroid     Past Surgical History:  Procedure Laterality Date  . ABDOMINAL HERNIA REPAIR    . LOOP RECORDER INSERTION N/A 03/11/2017   Procedure: Loop Recorder Insertion;  Surgeon: Evans Lance, MD;  Location: [facility];  Service: Cardiovascular;  Laterality: N/A;  . PERIPHERAL VASCULAR CATHETERIZATION Right 07/06/2016   Procedure: Lower Extremity Angiography;  Surgeon: Angelia Mould, MD;  Location: [facility];  Service: Cardiovascular;  Laterality: Right;  . PERIPHERAL VASCULAR CATHETERIZATION Right 07/06/2016   Procedure: Peripheral Vascular Intervention;  Surgeon: Angelia Mould, MD;  Location: [facility];  Service: Cardiovascular;  Laterality: Right;  Superficial femoral  . TEE WITHOUT CARDIOVERSION N/A 03/11/2017   Procedure: TRANSESOPHAGEAL ECHOCARDIOGRAM (TEE);  Surgeon: Thayer Headings, MD;  Location: [facility];  Service: Cardiovascular;  Laterality: N/A;  . TOTAL HIP ARTHROPLASTY      Vitals:   03/22/17 1420  BP: 134/76  Pulse: 80  SpO2: 94%   Vital Signs at end of evaluation:  SpO2 = 96%   HR = 93bpm       Subjective Assessment - 03/22/17 1348    Subjective Patient is an 83 year old female presenting to PT s/p L CVA (posterior cerebral artery) on 03/09/2017 resulting in R sided weakness and R hemianopsia. Patient reports being very active in her large garden at home. Patient reports noting gross BLE edema since being home following the CVA.    Pertinent History arthritis, hyperlipidemia, migraine, R femoral artery stenosis s/p stenting, HTN, hypothyroidism, glaucoma, L CVA (03/09/2017), PAD, spinal stenosis with buldging disc, GERD, depression, R THA     Limitations Lifting;Standing;Walking;House hold activities   Patient Stated Goals improve balance, get stronger, gardening without balance trouble    Currently in Pain? No/denies            [facility] PT Assessment - 03/22/17 1400      Assessment   Medical Diagnosis L CVA (posterior cerebral artery)    Referring Provider Rosalin Hawking MD    Onset Date/Surgical Date 03/09/17     Precautions   Precautions Fall   Precaution Comments --     Balance Screen   Has the patient fallen in the past 6 months No   Has the patient had a decrease in activity level because of a fear of falling?  No   Is the patient reluctant to leave their home because of a fear of falling?  No     Home Ecologist residence   Living Arrangements  Children;Other relatives  sister and daughter live with patient since before CVA   Type of [place] to enter   Entrance Stairs-Number of Steps 3  3 in garage; 3-4 to front door    Entrance Stairs-Rails Left;Can reach both  L in garage;    Home Layout Two level  can live on main level    Alternate Level Stairs-Number of Steps 14  to get upstairs - patient reports she never goes Clinical biochemist - 2 wheels;Walker - 4 wheels;Cane - single point;Bedside commode;Grab bars - toilet;Grab bars - tub/shower     Prior Function   Level of  Independence Independent   Vocation Retired  retired Therapist, sports    Leisure gardening     Posture/Postural Control   Posture/Postural Control Postural limitations   Postural Limitations Rounded Shoulders;Forward head;Flexed trunk     ROM / Strength   AROM / PROM / Strength Strength     Strength   Overall Strength Deficits   Strength Assessment Site Hip;Knee;Ankle   Right/Left Hip Right;Left   Right Hip Flexion 4-/5   Right Hip ABduction 4-/5   Left Hip Flexion 4+/5   Left Hip ABduction 4/5   Right/Left Knee Right;Left   Right Knee Extension 4+/5   Left Knee Extension 5/5   Right/Left Ankle Right;Left   Right Ankle Dorsiflexion 4-/5   Left Ankle Dorsiflexion 4+/5     Bed Mobility   Bed Mobility Rolling Right;Rolling Left;Supine to Sit;Sit to Supine  mod I in all bed mobility due to requiring increased time   Rolling Right 6: Modified independent (Device/Increase time)   Rolling Left 6: Modified independent (Device/Increase time)   Supine to Sit 6: Modified independent (Device/Increase time)   Sit to Supine 6: Modified independent (Device/Increase time)     Transfers   Transfers Sit to Stand;Stand to Sit   Sit to Stand 4: Min guard;With upper extremity assist;From chair/3-in-1   Stand to Sit 5: Supervision;With upper extremity assist;To chair/3-in-1     Ambulation/Gait   Ambulation/Gait Yes   Ambulation/Gait Assistance 4: Min guard   Ambulation Distance (Feet) 150 Feet   Assistive device None   Gait Pattern Step-through pattern;Decreased arm swing - right;Decreased arm swing - left;Decreased step length - left;Decreased stance time - right;Decreased stride length;Decreased dorsiflexion - right;Decreased dorsiflexion - left;Decreased weight shift to right;Abducted- right;Poor foot clearance - right   Ambulation Surface Level;Indoor   Gait velocity 2.95ft/s   Stairs Yes   Stairs Assistance 4: Min guard   Stairs Assistance Details (indicate cue type and reason) Patient reported  and demonstrated she performs stairs backwards when descending due to increased knee pain when descending stairs forward.    Stair Management Technique One rail Right;Step to pattern;Forwards   Number of Stairs 4  x2 reps   Height of Stairs 6     Standardized Balance Assessment   Standardized Balance Assessment Timed Up and Go Test;10 meter walk test   10 Meter Walk 2.7ft/s      Timed Up and Go Test   Normal TUG (seconds) 18.58   Cognitive TUG (seconds) 40.17   TUG Comments Cognitive TUG = 116% increase in time relative to standard TUG.     Functional Gait  Assessment   Gait assessed  Yes   Gait Level Surface Walks 20 ft, slow speed, abnormal gait pattern, evidence for imbalance or deviates 10-15 in outside of the 12 in walkway width. Requires more than 7  sec to ambulate 20 ft.   Change in Gait Speed Makes only minor adjustments to walking speed, or accomplishes a change in speed with significant gait deviations, deviates 10-15 in outside the 12 in walkway width, or changes speed but loses balance but is able to recover and continue walking.   Gait with Horizontal Head Turns Performs head turns smoothly with slight change in gait velocity (eg, minor disruption to smooth gait path), deviates 6-10 in outside 12 in walkway width, or uses an assistive device.   Gait with Vertical Head Turns Performs task with moderate change in gait velocity, slows down, deviates 10-15 in outside 12 in walkway width but recovers, can continue to walk.   Gait and Pivot Turn Pivot turns safely in greater than 3 sec and stops with no loss of balance, or pivot turns safely within 3 sec and stops with mild imbalance, requires small steps to catch balance.   Step Over Obstacle Cannot perform without assistance.   Gait with Narrow Base of Support Ambulates less than 4 steps heel to toe or cannot perform without assistance.   Gait with Eyes Closed Walks 20 ft, slow speed, abnormal gait pattern, evidence for imbalance,  deviates 10-15 in outside 12 in walkway width. Requires more than 9 sec to ambulate 20 ft.   Ambulating Backwards Walks 20 ft, slow speed, abnormal gait pattern, evidence for imbalance, deviates 10-15 in outside 12 in walkway width.   Steps Two feet to a stair, must use rail.   Total Score 10            Objective measurements completed on examination: See above findings.                  PT Education - 03/22/17 1510    Education provided Yes   Education Details plan of care    Person(s) Educated Patient   Methods Explanation   Comprehension Verbalized understanding          PT Short Term Goals - 03/22/17 1534      PT SHORT TERM GOAL #1   Title Patient will verbalize understanding and return demonstration for initial HEP to increase LE strengthening and balance. (TARGET DATE: 04/16/2017)    Time 4   Period Weeks   Status New     PT SHORT TERM GOAL #2   Title Patient will score </= 14.5s on the standard TUG to indicate a decrease in her risk of falling. (TARGET DATE: 04/16/2017)    Time 4   Period Weeks   Status New     PT SHORT TERM GOAL #3   Title Patient will score >/= 4 points higher than her baseline score on the Berg Balance Test to indicate a decrease in risk of falling. (TARGET DATE: 04/16/2017)    Time 4   Period Weeks   Status New     PT SHORT TERM GOAL #4   Title Patient's gait velocity will be >/= 2.72ft/s to indicate a decrease in risk of falling. (TARGET DATE: 04/16/2017)    Time 4   Period Weeks   Status New     PT SHORT TERM GOAL #5   Title Patient will demonstrate ability to ambulate on stairs with a reciprocal gait pattern, one rail, and min guard from PT to indicate an improvment in functional LE strength. (TARGET DATE: 04/16/2017)    Time 4   Period Weeks   Status New     Additional Short Term Goals   Additional  Short Term Goals Yes     PT SHORT TERM GOAL #6   Title Patient will demonstrate ability to ambulate 250 feet including  outdoor surfaces and ramp/curb with min guard to indicate a decreaes in risk of falling when outdoors and gardening. (TARGET DATE: 04/16/2017)    Time 4   Period Weeks   Status New     PT SHORT TERM GOAL #7   Title Patient will be able to verbalize signs, symptoms, and risk factors for CVA. (TARGET DATE: 04/16/2017)    Time 4   Period Weeks   Status New           PT Long Term Goals - 03/22/17 1524      PT LONG TERM GOAL #1   Title Patient will verbalize understanding and return demonstration for ongoing HEP to increase LE strength and balance. (TARGET DATE: 05/21/2017)    Time 2   Period Months   Status New     PT LONG TERM GOAL #2   Title Patient's gait velocity will be >/= 2.49ft/s to indicate a decrease risk of falling when returning to full community ambulation. (TARGET DATE: 05/21/2017)    Time 2   Period Months   Status New     PT LONG TERM GOAL #3   Title Patient will score >/= 8 points higher on Berg Balance Test than she scored at baseline to indicate a decrease in her risk of falling. (TARGET DATE: 05/21/2017)    Time 2   Period Months   Status New     PT LONG TERM GOAL #4   Title Patient will demonstrate </= a 85% increase in cognitive TUG time relative to standard TUG time to indicate a decrease in her risk of falling when completing attention demanding tasks. (TARGET DATE: 05/21/2017)    Time 2   Period Months   Status New     PT LONG TERM GOAL #5   Title --   Time --   Period --   Status --     Additional Long Term Goals   Additional Long Term Goals Yes     PT LONG TERM GOAL #6   Title Patient's FGA score will be >/= 18/30 to indicate a decrease in her risk of falling. (TARGET DATE: 05/21/2017)    Time 2   Period Months   Status New     PT LONG TERM GOAL #7   Title Patient will demonstrate ability to ambulate 1000 feet including outdoor surfaces (grass/gravel) and ramp/curbs to indicate a decrease in her risk of falling when ambulating outside while  gardening. (TARGET DATE: 05/21/2017)    Time 2   Period Months   Status New     PT LONG TERM GOAL #8   Title Patient will demonstrate ability to lift and carry objects simulating gardening equipment with transfers on outdoor surfaces to indicate a decreae in her risk of falling when gardening. (TARGET DATE: 05/21/2017)    Time 2   Period Months   Status New                Plan - 03/22/17 1520    Clinical Impression Statement Patient is an 83 year old female presenting to OPPT neuro with an evolving clinical presentation for a moderate complexity PT evaluation due to L CVA on 03/09/2017. The following deficits were noted during the patient's exam: decreased strength in bilateral LEs with greater weakness on R side placing patient at an increased risk for  falling, altered gait mechanics placing patient at an increased risk for falling, impaired balance placing the patient at an increased risk for falling, and increased imbalance and slowed gait speed when completing an attention demanding task placing the patient at an increased risk for falling. The patient's standard TUG score of 18.58s, cognitive TUG score of 40.17 (116% increase from standard TUG score), and Functional Gait Assessment score of 10/30 all indicate she is at a higher risk for repeated falls. PT monitored patient's vital signs throughout evaluation due to BLE edema, and patient's SpO2 remained >/=94% throughout session. Patient would benefit from skilled PT to address these impairments and functional limitations to maximize functional mobility independence and reduce falls risk.    History and Personal Factors relevant to plan of care: arthritis, hyperlipidemia, migraine, R femoral artery stenosis s/p stenting, HTN, hypothyroidism, glaucoma, L CVA (03/09/2017), PAD, spinal stenosis with buldging disc, GERD, depression, R THA     Clinical Presentation Evolving   Clinical Presentation due to: R hemianopsia and R sided weakness due to  L CVA, BLE edema    Clinical Decision Making Moderate   Rehab Potential Good   Clinical Impairments Affecting Rehab Potential arthritis, hyperlipidemia, migraine, R femoral artery stenosis s/p stenting, HTN, hypothyroidism, glaucoma, L CVA (03/09/2017), PAD, spinal stenosis with buldging disc, GERD, depression, R THA     PT Frequency 2x / week   PT Duration Other (comment)  9 weeks    PT Treatment/Interventions ADLs/Self Care Home Management;Neuromuscular re-education;Balance training;Therapeutic exercise;Therapeutic activities;Functional mobility training;Stair training;Gait training;DME Instruction;Patient/family education;Manual techniques;Energy conservation   Consulted and Agree with Plan of Care Patient      Patient will benefit from skilled therapeutic intervention in order to improve the following deficits and impairments:  Abnormal gait, Decreased activity tolerance, Decreased balance, Decreased mobility, Decreased knowledge of use of DME, Decreased knowledge of precautions, Decreased endurance, Decreased strength, Difficulty walking, Impaired vision/preception, Improper body mechanics, Postural dysfunction  Visit Diagnosis: Unsteadiness on feet  Muscle weakness (generalized)  Other abnormalities of gait and mobility      G-Codes - 2017-04-09 1545    Functional Assessment Tool Used (Outpatient Only) Functional Gait Assessment = 10/30; Standard TUG =18.58s; Cognitive TUG = 40.17s   Functional Limitation Mobility: Walking and moving around   Mobility: Walking and Moving Around Current Status [phone]) At least 60 percent but less than 80 percent impaired, limited or restricted   Mobility: Walking and Moving Around Goal Status [phone]) At least 20 percent but less than 40 percent impaired, limited or restricted       Problem List Patient Active Problem List   Diagnosis Date Noted  . CVA (cerebral vascular accident) ([place]) 03/09/2017  . htn 03/09/2017  . Weakness 03/09/2017  .  Stroke-like symptom 03/09/2017  . Right sided weakness   . PAD (peripheral artery disease) ([place])   . Essential hypertension   . Spinal stenosis of lumbar region   . Pure hypercholesterolemia   . Recurrent major depressive disorder [facility])     Arelia Sneddon, SPT  04-09-17, 4:55 PM  [facility] [address] [place], [place], [address] Phone: [phone]   Fax:  [phone]  Name: Alyssa Odonnell MRN: [id number] Date of Birth: 08/12/1934

## 2017-03-22 NOTE — Therapy (Signed)
[facility] [address] [place], [place], [address] Phone: [phone]   Fax:  [phone]  Occupational Therapy Evaluation  Patient Details  Name: Alyssa Odonnell MRN: [id number] Date of Birth: 12-08-1933 Referring Provider: Dr. Dossie Arbour  Encounter Date: 03/22/2017      OT End of Session - 03/22/17 1425    Visit Number 1   Number of Visits 8   Date for OT Re-Evaluation 04/19/17   Authorization Type medicare - will need G code and PN every 10th visit   Authorization Time Period 60 days   Authorization - Visit Number 1   Authorization - Number of Visits 10   OT Start Time 1316   OT Stop Time 1400   OT Time Calculation (min) 44 min   Activity Tolerance Patient tolerated treatment well      Past Medical History:  Diagnosis Date  . Glaucoma   . Hyperlipidemia   . Hypertension   . Hypothyroid     Past Surgical History:  Procedure Laterality Date  . ABDOMINAL HERNIA REPAIR    . LOOP RECORDER INSERTION N/A 03/11/2017   Procedure: Loop Recorder Insertion;  Surgeon: Evans Lance, MD;  Location: [facility];  Service: Cardiovascular;  Laterality: N/A;  . PERIPHERAL VASCULAR CATHETERIZATION Right 07/06/2016   Procedure: Lower Extremity Angiography;  Surgeon: Angelia Mould, MD;  Location: [facility];  Service: Cardiovascular;  Laterality: Right;  . PERIPHERAL VASCULAR CATHETERIZATION Right 07/06/2016   Procedure: Peripheral Vascular Intervention;  Surgeon: Angelia Mould, MD;  Location: [facility];  Service: Cardiovascular;  Laterality: Right;  Superficial femoral  . TEE WITHOUT CARDIOVERSION N/A 03/11/2017   Procedure: TRANSESOPHAGEAL ECHOCARDIOGRAM (TEE);  Surgeon: Acie Fredrickson Wonda Cheng, MD;  Location: [facility];  Service: Cardiovascular;  Laterality: N/A;  . TOTAL HIP ARTHROPLASTY      Vitals:   03/22/17 1324  BP: 140/86        Subjective Assessment - 03/22/17 1326    Subjective  My balance was bad before - I have walkers at home but I don't think I need it.    Pertinent History see epic. 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 Pt with LOOP RECORDER!!!!   Patient Stated Goals I want to get stronger, and maybe my joints more loose.  My vision is bad right now   Currently in Pain? No/denies           [facility] OT Assessment - 03/22/17 0001      Assessment   Diagnosis L PCA CVA   Referring Provider Dr. Dossie Arbour   Onset Date 03/09/17   Prior Therapy  acute PT, OT,      Precautions   Precautions Fall   Precaution Comments Pt also with R hemianopsia     Restrictions   Weight Bearing Restrictions No     Balance Screen   Has the patient fallen in the past 6 months No  Pt states "I have come near it but not actually fallen."     Home  Environment   Family/patient expects to be discharged to: Private residence   [place]  dtr and sister   Available Help at Discharge Available 24 hours/day  sister recovering from cancer   Type of [place] Two level  pt lives on first level only   Bathroom Shower/Tub Tub/Shower unit  pt stands in shower, grabs doors.  No seat in shower  Bathroom Toilet Standard   Additional Comments 3 in 1 commode over toilet, grab bars at toilet.      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   Leisure garden, reading,      ADL   Eating/Feeding Independent   Grooming Independent   Upper Body Bathing Independent  pt appears very unsteady on feet   Lower Body Bathing Independent   Upper Body Dressing Independent   Lower Body Dressing Independent   Toilet Transfer Independent   Toileting - Water engineer -  Hygiene Independent   Tub/Shower Transfer Modified independent  grabs onto shower door to get in and out   ADL comments see above for equipment     IADL    Shopping Needs to be accompanied on any shopping trip   [place] alone or with occasional assistance   Meal Prep Plans, prepares and serves adequate meals independently   [place] on family or friends for transportation   Medication Management Is responsible for taking medication in correct dosages at correct time   Physiological scientist financial matters independently (budgets, writes checks, pays rent, bills goes to bank), collects and keeps track of income     Mobility   Mobility Status History of falls   Mobility Status Comments Pt has 2 different walkers but is not currently using either one. Pt appears very unsteady on feet however reports "I haven't fallen in over a year.  I guess my balance is maybe a little worse since the stroke but it wasn't good before."     Written Expression   Dominant Hand Right     Vision - History   Baseline Vision Wears glasses only for reading   Visual History Glaucome   Additional Comments Pt reports visual problems in seeing things on the right     Vision Assessment   Eye Alignment Within Functional Limits   Reading Acuity --  pt reports difficulty with reading due hemianopsia   Ocular Range of Motion Within Functional Limits   Tracking/Visual Pursuits Able to track stimulus in all quads without difficulty   Visual Fields Right homonymous Hemainpsia   Depth Perception Pt reports that at times she reaches for things and they are farther away then they look      Activity Tolerance   Activity Tolerance Tolerates 30 min activity with muliple rests   Activity Tolerance Comments Pt reports that she tires more easily - "I keep a chair handy and sit when I need to."     Cognition   Overall Cognitive Status Impaired/Different from baseline   [place] Exam  Pt states it takes her a little longer to think things through and "I have to at times really think things through when I do them. 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 - feels her arms move "exactly like they  did before."     Perception   Perception Impaired     Tone   Assessment Location Right Upper Extremity     ROM / Strength   AROM / PROM / Strength AROM;Strength     AROM   Overall AROM  Within functional limits for tasks performed   Overall AROM Comments BUE's      Strength   Overall Strength Within functional limits for tasks performed   Overall Strength Comments BUE's      Hand Function   Right Hand Gross Grasp Impaired  pt is R handed   Right Hand Grip (lbs) 40   Left Hand Gross Grasp Functional   Left Hand Grip (lbs) 45     RUE Tone   RUE Tone Within Functional Limits                              OT Long Term Goals - 03/22/17 1413      OT LONG TERM GOAL #1   Title Pt will be mod I with HEP for R grip strength - 04/19/2017   Status New     OT LONG TERM GOAL #2   Title Pt will demonstrate ability to compensate for reading with strategies and no more than min vc's for R hemianopsia   Status New     OT LONG TERM GOAL #3   Title Pt will complete table top scanning activities with at least 95% accuracy   Status New     OT LONG TERM GOAL #4   Title Pt will complete environmental scanning tasks with at least 90% accuracy   Status New     OT LONG TERM GOAL #5   Title Pt will demonstrate improved grip strength RUE by at least 5 pounds to assist with functional tasks (baseline= 40 pounds)   Status New     OT LONG TERM GOAL #6   Title Pt will verbalize understanding of safety  recommendations for balance during functional activities.    Status New               Plan - 03/22/17 1417    Clinical Impression Statement Pt is an 83 year old female s/p L PCA CVA on 03/09/2017. Pt was discharged from the hospital on 03/11/2017 and lives with her dtr and elderly sister.  Pt presents today with the following that impact IADL, work and leisure tasks:  unsteadiness on feet, R hemianopsia, decreased activity tolerance, decreased grip strength RUE, decreased processing of information.  Pt will benefit from skilled OT to address these deficits to maximize independence, improve use of RUE, and reduce risk of falls.    Occupational Profile and client history currently impacting functional performance Old R BG infarct, HTN, glaucoma, R THA;  pt has loop recorder   Occupational performance deficits (Please refer to evaluation for details): IADL's;Work;Leisure   Rehab Potential Good   OT Frequency 2x / week   OT Duration 4 weeks   OT Treatment/Interventions Self-care/ADL training;Therapeutic exercise;Neuromuscular education;DME and/or AE instruction;Therapist, nutritional;Therapeutic activities;Cognitive remediation/compensation;Visual/perceptual remediation/compensation;Patient/family education;Balance training   Plan assess reading, try guides, give strategies, give putty HEP for RUE, balance, safety   Clinical Decision Making Several treatment options, min-mod task modification necessary   Recommended Other Services ST for slow processing, word finding deficits   Consulted and Agree with Plan of Care Patient      Patient will benefit from skilled therapeutic intervention in  order to improve the following deficits and impairments:  Decreased activity tolerance, Decreased balance, Decreased cognition, Decreased safety awareness, Decreased mobility, Decreased strength, Difficulty walking, Impaired UE functional use, Impaired sensation  Visit Diagnosis: Muscle weakness  (generalized) - Plan: Ot plan of care cert/re-cert  Visuospatial deficit - Plan: Ot plan of care cert/re-cert  Unsteadiness on feet - Plan: Ot plan of care cert/re-cert  Other symptoms and signs involving cognitive functions following other cerebrovascular disease - Plan: Ot plan of care cert/re-cert      G-Codes - 77/37/36 1427    Functional Assessment Tool Used (Outpatient only) dynamometer, skilled clinical observation   Functional Limitation Self care  for IADL   Self Care Current Status [id number]) At least 60 percent but less than 80 percent impaired, limited or restricted   Self Care Goal Status (E7076) At least 20 percent but less than 40 percent impaired, limited or restricted      Problem List Patient Active Problem List   Diagnosis Date Noted  . CVA (cerebral vascular accident) ([facility]) 03/09/2017  . htn 03/09/2017  . Weakness 03/09/2017  . Stroke-like symptom 03/09/2017  . Right sided weakness   . PAD (peripheral artery disease) ([facility])   . Essential hypertension   . Spinal stenosis of lumbar region   . Pure hypercholesterolemia   . Recurrent major depressive disorder [facility])     Quay Burow, OTR/L 03/22/2017, 2:31 PM  [facility] [address] [place], [place], [address] Phone: [phone]   Fax:  [phone]  Name: JAZIYA OBARR MRN: [id number] Date of Birth: Mar 13, 1934

## 2017-03-22 NOTE — Telephone Encounter (Signed)
Sure. I have ordered. Thanks for your help.  Rosalin Hawking, MD PhD Stroke Neurology 03/22/2017 5:08 PM  Orders Placed This Encounter  Procedures  . Ambulatory referral to Speech Therapy    Referral Priority:   Routine    Referral Type:   Speech Therapy    Referral Reason:   Specialty Services Required    Requested Specialty:   Speech Pathology    Number of Visits Requested:   1

## 2017-03-22 NOTE — Telephone Encounter (Signed)
Dr. Erlinda Hong, I am [facility] pt [facility]. She recently had a stroke. She is complaining of word finding problems, is very tangential and verbose and reports she feels she is slower to think things through. If you agree could you please order ST eval and tx? Thanks Santiago Glad

## 2017-03-23 ENCOUNTER — Ambulatory Visit: Payer: Medicare Other

## 2017-03-24 ENCOUNTER — Ambulatory Visit: Payer: Medicare Other | Admitting: Physical Therapy

## 2017-03-24 ENCOUNTER — Encounter: Payer: Self-pay | Admitting: Physical Therapy

## 2017-03-24 DIAGNOSIS — M6281 Muscle weakness (generalized): Secondary | ICD-10-CM

## 2017-03-24 DIAGNOSIS — R2689 Other abnormalities of gait and mobility: Secondary | ICD-10-CM

## 2017-03-24 DIAGNOSIS — R2681 Unsteadiness on feet: Secondary | ICD-10-CM

## 2017-03-24 NOTE — Patient Instructions (Addendum)
Functional Quadriceps: Sit to Stand    Sit on edge of chair, feet flat on floor. Stand upright, extending knees fully. Use arms as needed. Repeat _10_ times per set. Do _1_ sets per session. Do _1-2_ sessions per day.  http://orth.exer.us/735   Copyright  VHI. All rights reserved.   Perform these at a counter top for balance as needed:   "I love a Parade" Lift   At counter for balance as needed: high knee marching forward and then backward. 3 second pauses with each knee lift.  Repeat 3 laps each way. Do _1-2_ sessions per day. http://gt2.exer.us/345   Copyright  VHI. All rights reserved.  Side-Stepping   At counter for balance as needed: Walk to left side with eyes open. Take even steps, leading with same foot. Make sure each foot lifts off the floor. Repeat in opposite direction. Keep feet pointed toward counter. Repeat for 3 laps each way.  Do _1-2__ sessions per day.  Copyright  VHI. All rights reserved.   Feet Heel-Toe "Tandem"   At counter: Arms at sides, walk a straight line forward bringing one foot directly in front of the other, and then a straight line backwards bringing one foot directly behind the other one.  Repeat for _3 laps each way. Do _1-2_ sessions per day.  Copyright  VHI. All rights reserved.

## 2017-03-25 ENCOUNTER — Encounter: Payer: Self-pay | Admitting: Physician Assistant

## 2017-03-25 NOTE — Therapy (Signed)
[facility] [address] [place], [place], [address] Phone: [phone]   Fax:  [phone]  Physical Therapy Treatment  Patient Details  Name: Alyssa Odonnell MRN: [id number] Date of Birth: [birth year] Referring Provider: Rosalin Hawking MD   Encounter Date: 03/24/2017      PT End of Session - 03/24/17 2952    Visit Number 2   Number of Visits 18   Date for PT Re-Evaluation 05/21/17   Authorization Type Medicare & G-codes every 10th visit    PT Start Time 1235   PT Stop Time 1315   PT Time Calculation (min) 40 min   Equipment Utilized During Treatment Gait belt   Activity Tolerance Patient tolerated treatment well   Behavior During Therapy [facility] for tasks assessed/performed      Past Medical History:  Diagnosis Date  . Glaucoma   . Hyperlipidemia   . Hypertension   . Hypothyroid     Past Surgical History:  Procedure Laterality Date  . ABDOMINAL HERNIA REPAIR    . LOOP RECORDER INSERTION N/A 03/11/2017   Procedure: Loop Recorder Insertion;  Surgeon: Evans Lance, MD;  Location: [facility];  Service: Cardiovascular;  Laterality: N/A;  . PERIPHERAL VASCULAR CATHETERIZATION Right 07/06/2016   Procedure: Lower Extremity Angiography;  Surgeon: Angelia Mould, MD;  Location: [facility];  Service: Cardiovascular;  Laterality: Right;  . PERIPHERAL VASCULAR CATHETERIZATION Right 07/06/2016   Procedure: Peripheral Vascular Intervention;  Surgeon: Angelia Mould, MD;  Location: [facility];  Service: Cardiovascular;  Laterality: Right;  Superficial femoral  . TEE WITHOUT CARDIOVERSION N/A 03/11/2017   Procedure: TRANSESOPHAGEAL ECHOCARDIOGRAM (TEE);  Surgeon: Acie Fredrickson Wonda Cheng, MD;  Location: [facility] ENDOSCOPY;  Service: Cardiovascular;  Laterality: N/A;  . TOTAL HIP ARTHROPLASTY      There were no vitals filed for this visit.      Subjective Assessment - 03/24/17 1240    Subjective No new  complaints. No falls to report. Reports not using RW despite instruction to do so at evaluation.    Pertinent History arthritis, hyperlipidemia, migraine, R femoral artery stenosis s/p stenting, HTN, hypothyroidism, glaucoma, L CVA (03/09/2017), PAD, spinal stenosis with buldging disc, GERD, depression, R THA     Limitations Lifting;Standing;Walking;House hold activities   Patient Stated Goals improve balance, get stronger, gardening without balance trouble    Currently in Pain? No/denies           [facility] Adult PT Treatment/Exercise - 03/24/17 1243      Transfers   Transfers Sit to Stand;Stand to Sit   Sit to Stand 5: Supervision;From elevated surface;From bed;From chair/3-in-1;With upper extremity assist   Stand to Sit 5: Supervision;With upper extremity assist;To bed;To chair/3-in-1     Ambulation/Gait   Ambulation/Gait Yes   Ambulation/Gait Assistance 4: Min assist;5: Supervision   Ambulation/Gait Assistance Details min assist for balance/stability with gait without AD, supervision with use of cane with rubber quad tip. Pt plans to have daughter purchase one for her at [facility] this week.    Ambulation Distance (Feet) 100 Feet  x1 no AD, 115 x1 with cane   Assistive device None;Straight cane  cane with rubber quad tip   Gait Pattern Step-through pattern;Decreased arm swing - right;Decreased arm swing - left;Decreased step length - left;Decreased stance time - right;Decreased stride length;Decreased dorsiflexion - right;Decreased dorsiflexion - left;Decreased weight shift to right;Abducted- right;Poor foot clearance - right   Ambulation Surface Level;Indoor   Gait Comments Encouraged  walker as was recommended on evaluation. Pt stated she will not use one, therefore triled pt with cane. Pt supervision on indoor surfaces. Will need extra practice with outdoor/compliant surfaces.      issued the following to HEP today: Functional Quadriceps: Sit to Stand    Sit on edge of chair, feet  flat on floor. Stand upright, extending knees fully. Use arms as needed. Repeat _10_ times per set. Do _1_ sets per session. Do _1-2_ sessions per day.  http://orth.exer.us/735   Copyright  VHI. All rights reserved.   Perform these at a counter top for balance as needed:   "I love a Parade" Lift   At counter for balance as needed: high knee marching forward and then backward. 3 second pauses with each knee lift.  Repeat 3 laps each way. Do _1-2_ sessions per day. http://gt2.exer.us/345   Copyright  VHI. All rights reserved.  Side-Stepping   At counter for balance as needed: Walk to left side with eyes open. Take even steps, leading with same foot. Make sure each foot lifts off the floor. Repeat in opposite direction. Keep feet pointed toward counter. Repeat for 3 laps each way.  Do _1-2__ sessions per day.  Copyright  VHI. All rights reserved.   Feet Heel-Toe "Tandem"   At counter: Arms at sides, walk a straight line forward bringing one foot directly in front of the other, and then a straight line backwards bringing one foot directly behind the other one.  Repeat for _3 laps each way. Do _1-2_ sessions per day.  Copyright  VHI. All rights reserved.         PT Education - 03/24/17 1311    Education provided Yes   Education Details use of AD for stability/safety- pt prefers cane with quad tip over walker/rollator; HEP for strengthening and balance   Person(s) Educated Patient   Methods Explanation;Demonstration;Verbal cues;Handout   Comprehension Verbalized understanding;Returned demonstration;Verbal cues required;Need further instruction          PT Short Term Goals - 03/22/17 1534      PT SHORT TERM GOAL #1   Title Patient will verbalize understanding and return demonstration for initial HEP to increase LE strengthening and balance. (TARGET DATE: 04/16/2017)    Time 4   Period Weeks   Status New     PT SHORT TERM GOAL #2   Title Patient will score </= 14.5s  on the standard TUG to indicate a decrease in her risk of falling. (TARGET DATE: 04/16/2017)    Time 4   Period Weeks   Status New     PT SHORT TERM GOAL #3   Title Patient will score >/= 4 points higher than her baseline score on the Berg Balance Test to indicate a decrease in risk of falling. (TARGET DATE: 04/16/2017)    Time 4   Period Weeks   Status New     PT SHORT TERM GOAL #4   Title Patient's gait velocity will be >/= 2.96ft/s to indicate a decrease in risk of falling. (TARGET DATE: 04/16/2017)    Time 4   Period Weeks   Status New     PT SHORT TERM GOAL #5   Title Patient will demonstrate ability to ambulate on stairs with a reciprocal gait pattern, one rail, and min guard from PT to indicate an improvment in functional LE strength. (TARGET DATE: 04/16/2017)    Time 4   Period Weeks   Status New     Additional Short Term  Goals   Additional Short Term Goals Yes     PT SHORT TERM GOAL #6   Title Patient will demonstrate ability to ambulate 250 feet including outdoor surfaces and ramp/curb with min guard to indicate a decreaes in risk of falling when outdoors and gardening. (TARGET DATE: 04/16/2017)    Time 4   Period Weeks   Status New     PT SHORT TERM GOAL #7   Title Patient will be able to verbalize signs, symptoms, and risk factors for CVA. (TARGET DATE: 04/16/2017)    Time 4   Period Weeks   Status New           PT Long Term Goals - 03/22/17 1524      PT LONG TERM GOAL #1   Title Patient will verbalize understanding and return demonstration for ongoing HEP to increase LE strength and balance. (TARGET DATE: 05/21/2017)    Time 2   Period Months   Status New     PT LONG TERM GOAL #2   Title Patient's gait velocity will be >/= 2.76ft/s to indicate a decrease risk of falling when returning to full community ambulation. (TARGET DATE: 05/21/2017)    Time 2   Period Months   Status New     PT LONG TERM GOAL #3   Title Patient will score >/= 8 points higher on Berg  Balance Test than she scored at baseline to indicate a decrease in her risk of falling. (TARGET DATE: 05/21/2017)    Time 2   Period Months   Status New     PT LONG TERM GOAL #4   Title Patient will demonstrate </= a 85% increase in cognitive TUG time relative to standard TUG time to indicate a decrease in her risk of falling when completing attention demanding tasks. (TARGET DATE: 05/21/2017)    Time 2   Period Months   Status New     PT LONG TERM GOAL #5   Title --   Time --   Period --   Status --     Additional Long Term Goals   Additional Long Term Goals Yes     PT LONG TERM GOAL #6   Title Patient's FGA score will be >/= 18/30 to indicate a decrease in her risk of falling. (TARGET DATE: 05/21/2017)    Time 2   Period Months   Status New     PT LONG TERM GOAL #7   Title Patient will demonstrate ability to ambulate 1000 feet including outdoor surfaces (grass/gravel) and ramp/curbs to indicate a decrease in her risk of falling when ambulating outside while gardening. (TARGET DATE: 05/21/2017)    Time 2   Period Months   Status New     PT LONG TERM GOAL #8   Title Patient will demonstrate ability to lift and carry objects simulating gardening equipment with transfers on outdoor surfaces to indicate a decreae in her risk of falling when gardening. (TARGET DATE: 05/21/2017)    Time 2   Period Months   Status New           Plan - 03/24/17 1241    Clinical Impression Statement today's skilled session focused on gait safety with use of AD vs none and on establishing HEP for LE strengthening and balance. No issues reported in session. Pt stated she does not want to use a walker/rollator (she has both). Was agreeable to using cane and to getting a rubber quad tip for hers. Pt is progressing  toward goals and should benefit from continued PT to progress toward unmet goals.    Rehab Potential Good   Clinical Impairments Affecting Rehab Potential arthritis, hyperlipidemia, migraine, R  femoral artery stenosis s/p stenting, HTN, hypothyroidism, glaucoma, L CVA (03/09/2017), PAD, spinal stenosis with buldging disc, GERD, depression, R THA     PT Frequency 2x / week   PT Duration Other (comment)  9 weeks    PT Treatment/Interventions ADLs/Self Care Home Management;Neuromuscular re-education;Balance training;Therapeutic exercise;Therapeutic activities;Functional mobility training;Stair training;Gait training;DME Instruction;Patient/family education;Manual techniques;Energy conservation   PT Next Visit Plan continue to work on gait with cane, outside as weather permits; continued to work on LE strengthening and balance; add corner balance to HEP as appropriate   Consulted and Agree with Plan of Care Patient      Patient will benefit from skilled therapeutic intervention in order to improve the following deficits and impairments:  Abnormal gait, Decreased activity tolerance, Decreased balance, Decreased mobility, Decreased knowledge of use of DME, Decreased knowledge of precautions, Decreased endurance, Decreased strength, Difficulty walking, Impaired vision/preception, Improper body mechanics, Postural dysfunction  Visit Diagnosis: Unsteadiness on feet  Muscle weakness (generalized)  Other abnormalities of gait and mobility     Problem List Patient Active Problem List   Diagnosis Date Noted  . CVA (cerebral vascular accident) ([place]) 03/09/2017  . htn 03/09/2017  . Weakness 03/09/2017  . Stroke-like symptom 03/09/2017  . Right sided weakness   . PAD (peripheral artery disease) ([place])   . Essential hypertension   . Spinal stenosis of lumbar region   . Pure hypercholesterolemia   . Recurrent major depressive disorder ([place])     Willow Ora, PTA, [place] [address] [place], [place] [address] [phone] 03/25/17, 4:48 PM   Name: Alyssa Odonnell MRN: [id number] Date of Birth: 07-13-1934

## 2017-03-29 ENCOUNTER — Encounter: Payer: Self-pay | Admitting: Internal Medicine

## 2017-03-29 ENCOUNTER — Ambulatory Visit: Payer: Medicare Other | Admitting: Occupational Therapy

## 2017-03-30 ENCOUNTER — Other Ambulatory Visit: Payer: Self-pay | Admitting: *Deleted

## 2017-03-30 ENCOUNTER — Ambulatory Visit: Payer: Medicare Other | Admitting: Occupational Therapy

## 2017-03-30 NOTE — Patient Outreach (Signed)
[facility]) Care Management  03/30/2017  VORA CLOVER 25-Jan-1934 [id number]  EMMI-Stroke referral for red dashboard-Day #13-6/11/18 for feeling worse-yes & new problem-yes:  Telephone call attempt x 1 ; left HIPPA compliant voice mail requesting return call.   Plan: Will follow up.  Sherrin Daisy, RN BSN [place] Management Coordinator [facility] Care Management  [phone]

## 2017-03-31 ENCOUNTER — Other Ambulatory Visit: Payer: Self-pay | Admitting: *Deleted

## 2017-03-31 NOTE — Patient Outreach (Signed)
[facility]) Care Management  03/31/2017  FREDDI FORSTER 10-Jul-1934 [id number]  Telephone call attempt x 2 ; left HIPPA compliant voice mail requesting return call.   Plan: Will follow up.  Sherrin Daisy, RN BSN [place] Management Coordinator [facility] Care Management  [phone]

## 2017-04-01 ENCOUNTER — Other Ambulatory Visit: Payer: Self-pay | Admitting: *Deleted

## 2017-04-01 NOTE — Patient Outreach (Signed)
[facility] [facility]) Care Management  04/01/2017  Alyssa Odonnell 1934-03-09 [id number]  EMMI-Stroke referral:  Telephone call attempt x 3, left message on voice mail.  Plan: Case closure. Sent to care management assistant.  Sherrin Daisy, RN BSN [place] Management Coordinator [facility] Care Management  [phone]

## 2017-04-05 ENCOUNTER — Encounter: Payer: Medicare Other | Admitting: Occupational Therapy

## 2017-04-07 ENCOUNTER — Ambulatory Visit: Payer: Medicare Other | Admitting: Physical Therapy

## 2017-04-09 ENCOUNTER — Ambulatory Visit: Payer: Medicare Other | Admitting: Physical Therapy

## 2017-04-12 ENCOUNTER — Ambulatory Visit (INDEPENDENT_AMBULATORY_CARE_PROVIDER_SITE_OTHER): Payer: Medicare Other | Admitting: *Deleted

## 2017-04-12 DIAGNOSIS — I639 Cerebral infarction, unspecified: Secondary | ICD-10-CM

## 2017-04-12 NOTE — Progress Notes (Signed)
Carelink Summary Report / Loop Recorder 

## 2017-04-13 ENCOUNTER — Ambulatory Visit: Payer: Medicare Other | Admitting: Physical Therapy

## 2017-04-13 ENCOUNTER — Encounter: Payer: Medicare Other | Admitting: Occupational Therapy

## 2017-04-16 ENCOUNTER — Encounter: Payer: Medicare Other | Admitting: Occupational Therapy

## 2017-04-16 ENCOUNTER — Ambulatory Visit: Payer: Medicare Other | Admitting: Physical Therapy

## 2017-04-16 LAB — CUP PACEART REMOTE DEVICE CHECK
Date Time Interrogation Session: 20180623193830
Implantable Pulse Generator Implant Date: 20180524

## 2017-04-19 ENCOUNTER — Ambulatory Visit: Payer: Medicare Other | Admitting: Occupational Therapy

## 2017-04-19 ENCOUNTER — Ambulatory Visit: Payer: Medicare Other | Admitting: Physical Therapy

## 2017-04-19 ENCOUNTER — Encounter: Payer: Self-pay | Admitting: Occupational Therapy

## 2017-04-19 DIAGNOSIS — M6281 Muscle weakness (generalized): Secondary | ICD-10-CM

## 2017-04-19 NOTE — Therapy (Signed)
[facility] [address] [place], [place], [address] Phone: [phone]   Fax:  [phone]  Occupational Therapy Treatment  Patient Details  Name: Alyssa Odonnell MRN: [id number] Date of Birth: 31-Jan-1934 Referring Provider: Dr. Dossie Arbour  Encounter Date: 04/19/2017    Past Medical History:  Diagnosis Date  . Glaucoma   . Hyperlipidemia   . Hypertension   . Hypothyroid     Past Surgical History:  Procedure Laterality Date  . ABDOMINAL HERNIA REPAIR    . LOOP RECORDER INSERTION N/A 03/11/2017   Procedure: Loop Recorder Insertion;  Surgeon: Evans Lance, MD;  Location: [facility];  Service: Cardiovascular;  Laterality: N/A;  . PERIPHERAL VASCULAR CATHETERIZATION Right 07/06/2016   Procedure: Lower Extremity Angiography;  Surgeon: Angelia Mould, MD;  Location: [facility];  Service: Cardiovascular;  Laterality: Right;  . PERIPHERAL VASCULAR CATHETERIZATION Right 07/06/2016   Procedure: Peripheral Vascular Intervention;  Surgeon: Angelia Mould, MD;  Location: [place] [facility];  Service: Cardiovascular;  Laterality: Right;  Superficial femoral  . TEE WITHOUT CARDIOVERSION N/A 03/11/2017   Procedure: TRANSESOPHAGEAL ECHOCARDIOGRAM (TEE);  Surgeon: Acie Fredrickson Wonda Cheng, MD;  Location[facility] ENDOSCOPY;  Service: Cardiovascular;  Laterality: N/A;  . TOTAL HIP ARTHROPLASTY      There were no vitals filed for this visit.                                 OT Long Term Goals - 04/19/17 1244      OT LONG TERM GOAL #1   Title Pt will be mod I with HEP for R grip strength - 04/19/2017   Status Unable to assess     OT LONG TERM GOAL #2   Title Pt will demonstrate ability to compensate for reading with strategies and no more than min vc's for R hemianopsia   Status Unable to assess     OT LONG TERM GOAL #3   Title Pt will complete table top scanning activities with at least  95% accuracy   Status Unable to assess     OT LONG TERM GOAL #4   Title Pt will complete environmental scanning tasks with at least 90% accuracy   Status Unable to assess     OT LONG TERM GOAL #5   Title Pt will demonstrate improved grip strength RUE by at least 5 pounds to assist with functional tasks (baseline= 40 pounds)   Status Unable to assess     OT LONG TERM GOAL #6   Title Pt will verbalize understanding of safety recommendations for balance during functional activities.    Status Unable to assess               Plan - 04/19/17 1245    Clinical Impression Statement Pt has not returned until today since OT eval on 03/22/2016.  Pt stated to front office staff "I am not sure why I am even here I don't think I need this and I am leaving since I thought my appt was at noon." Pt then requested that all other therapy appts be cancelled therefore will dishcarge pt from OT at this time.    Plan d/c from OT at pt's request      Patient will benefit from skilled therapeutic intervention in order to improve the following deficits and impairments:     Visit Diagnosis: Muscle weakness (generalized)  G-Codes - 04/19/17 1251    Functional Assessment Tool Used (Outpatient only) dynamometer, skilled clinical observation   Functional Limitation Self care   Self Care Current Status [phone]) At least 60 percent but less than 80 percent impaired, limited or restricted   Self Care Goal Status (R1735) At least 20 percent but less than 40 percent impaired, limited or restricted   Self Care Discharge Status [phone]) At least 60 percent but less than 80 percent impaired, limited or restricted  pt did not return after OT eval      Problem List Patient Active Problem List   Diagnosis Date Noted  . CVA (cerebral vascular accident) ([place]) 03/09/2017  . htn 03/09/2017  . Weakness 03/09/2017  . Stroke-like symptom 03/09/2017  . Right sided weakness   . PAD (peripheral artery disease)  ([place])   . Essential hypertension   . Spinal stenosis of lumbar region   . Pure hypercholesterolemia   . Recurrent major depressive disorder [facility])     Quay Burow, OTR/L 04/19/2017, 12:52 PM  [facility] [address] [place], [place], [address] Phone: [phone]   Fax:  [phone]  Name: SHERILYNN DIEU MRN: [id number] Date of Birth: 05-16-1934

## 2017-04-23 ENCOUNTER — Encounter: Payer: Medicare Other | Admitting: Occupational Therapy

## 2017-04-23 ENCOUNTER — Ambulatory Visit: Payer: Medicare Other | Admitting: Physical Therapy

## 2017-04-26 ENCOUNTER — Ambulatory Visit: Payer: Medicare Other | Admitting: Physical Therapy

## 2017-04-26 ENCOUNTER — Encounter: Payer: Medicare Other | Admitting: Occupational Therapy

## 2017-04-28 ENCOUNTER — Encounter: Payer: Self-pay | Admitting: Internal Medicine

## 2017-04-29 ENCOUNTER — Ambulatory Visit: Payer: Medicare Other | Admitting: Physical Therapy

## 2017-04-29 ENCOUNTER — Encounter: Payer: Medicare Other | Admitting: Occupational Therapy

## 2017-05-03 ENCOUNTER — Ambulatory Visit: Payer: Medicare Other | Admitting: Physical Therapy

## 2017-05-06 ENCOUNTER — Ambulatory Visit: Payer: Medicare Other | Admitting: Physical Therapy

## 2017-05-10 ENCOUNTER — Ambulatory Visit (INDEPENDENT_AMBULATORY_CARE_PROVIDER_SITE_OTHER): Payer: Medicare Other | Admitting: *Deleted

## 2017-05-10 ENCOUNTER — Ambulatory Visit: Payer: Medicare Other | Admitting: Physical Therapy

## 2017-05-10 DIAGNOSIS — I639 Cerebral infarction, unspecified: Secondary | ICD-10-CM

## 2017-05-11 NOTE — Progress Notes (Signed)
Carelink Summary Report / Loop Recorder 

## 2017-05-13 ENCOUNTER — Ambulatory Visit: Payer: Medicare Other | Admitting: Physical Therapy

## 2017-05-17 ENCOUNTER — Ambulatory Visit: Payer: Medicare Other | Admitting: Physical Therapy

## 2017-05-20 ENCOUNTER — Ambulatory Visit: Payer: Medicare Other | Admitting: Physical Therapy

## 2017-05-23 LAB — CUP PACEART REMOTE DEVICE CHECK
Date Time Interrogation Session: 20180723200710
Implantable Pulse Generator Implant Date: 20180524

## 2017-05-23 NOTE — Progress Notes (Signed)
Carelink summary report received. Battery status OK. Normal device function. No new symptom episodes, tachy episodes, brady, or pause episodes. No new AF episodes. Monthly summary reports and ROV/PRN 

## 2017-05-24 ENCOUNTER — Ambulatory Visit (INDEPENDENT_AMBULATORY_CARE_PROVIDER_SITE_OTHER): Payer: Medicare Other | Admitting: Neurology

## 2017-05-24 ENCOUNTER — Encounter (INDEPENDENT_AMBULATORY_CARE_PROVIDER_SITE_OTHER): Payer: Self-pay

## 2017-05-24 ENCOUNTER — Encounter: Payer: Self-pay | Admitting: Physical Therapy

## 2017-05-24 ENCOUNTER — Encounter: Payer: Self-pay | Admitting: Neurology

## 2017-05-24 ENCOUNTER — Ambulatory Visit: Payer: Medicare Other | Attending: Family Medicine | Admitting: Physical Therapy

## 2017-05-24 VITALS — BP 128/81 | HR 89 | Ht 63.0 in | Wt 185.0 lb

## 2017-05-24 DIAGNOSIS — E7800 Pure hypercholesterolemia, unspecified: Secondary | ICD-10-CM | POA: Diagnosis not present

## 2017-05-24 DIAGNOSIS — I63432 Cerebral infarction due to embolism of left posterior cerebral artery: Secondary | ICD-10-CM | POA: Diagnosis not present

## 2017-05-24 DIAGNOSIS — I639 Cerebral infarction, unspecified: Secondary | ICD-10-CM | POA: Diagnosis not present

## 2017-05-24 DIAGNOSIS — I70209 Unspecified atherosclerosis of native arteries of extremities, unspecified extremity: Secondary | ICD-10-CM | POA: Diagnosis not present

## 2017-05-24 DIAGNOSIS — I10 Essential (primary) hypertension: Secondary | ICD-10-CM

## 2017-05-24 MED ORDER — EVOLOCUMAB 140 MG/ML ~~LOC~~ SOSY: 140.0000 mg | PREFILLED_SYRINGE | SUBCUTANEOUS | 2 refills | Status: DC

## 2017-05-24 NOTE — Patient Instructions (Addendum)
-   continue plavix for stroke prevention - will continue to follow up with loop recorder - will do prior authorization for repatha injection of right cholesterol - follow up with your eye doctor to see if you are safe to drive and clear you for driving - Follow up with your primary care physician for stroke risk factor modification. Recommend maintain blood pressure goal <140/80, diabetes with hemoglobin A1c goal below 7.0% and lipids with LDL cholesterol goal below 70 mg/dL.  - check Bp at home and record - healthy diet and regular exercise - follow up in 4 months

## 2017-05-24 NOTE — Progress Notes (Signed)
STROKE NEUROLOGY FOLLOW UP NOTE  NAME: Alyssa Odonnell DOB: [birth year]  REASON FOR VISIT: stroke follow up HISTORY FROM: pt and chart  Today we had the pleasure of seeing Alyssa Odonnell in follow-up at our Neurology Clinic. Pt was accompanied by no one.   History Summary [name] is a 83 y.o. female with history of HLD, HTN, migraine equivalent, right femoral artery stenosis s/p stenting with subsequent occlusion, hypothyroidism, and glaucoma admitted on 03/09/17 for right hemianopia. MRI showed left PCA infarct and old right BG infarct. CTA h/n showed left proximal P2 occlusion. EF 60-65%. TEE not successful but loop recorder placed. LDL 242 and A1C 5.6. She was put on plavix due to ASA allergy and pravastatin. Recommend PCSK9 inhibitors as outpt.   Interval History During the interval time, the patient has been doing well. Right hemianopia improvement to right lower quadrantanopia. She drove over for clinic today and was encouraged to follow up with ophthalmology to clear for driving. Loop recorder so far negative. Unable to tolerate pravastatin which was discontinued. She has statin intolerance for Crestor, Zocor, and pravastatin with muscle stiffness. BP today 128/81.  REVIEW OF SYSTEMS: Full 14 system review of systems performed and notable only for those listed below and in HPI above, all others are negative:  Constitutional:   Cardiovascular: Leg swelling Ear/Nose/Throat:  Hearing loss Skin:  Eyes:  Loss of vision Respiratory:   Gastroitestinal:   Genitourinary: Urgency Hematology/Lymphatic:   Endocrine:  Musculoskeletal:  Joint pain, joint swelling, aching muscles, walking difficulty Allergy/Immunology:   Neurological:  Headache, numbness, speech difficulty, weakness Psychiatric: Agitation Sleep: Restless leg  The following represents the patient's updated allergies and side effects list: Allergies  Allergen Reactions  . Aspirin Other (See Comments)    Red  splotches  . Atorvastatin Other (See Comments)    Muscle spams  . Nabumetone Itching  . Nsaids     Reaction (??)  . Pravastatin     Stiff walking difficulty  . Procaine Swelling  . Rosuvastatin Nausea Only and Other (See Comments)    Reflux and Muscle stiffness  . Simvastatin Nausea Only and Other (See Comments)    Reflux and Muscle stiffness  . Statins Other (See Comments)  . Mobic [Meloxicam] Rash  . Penicillins Rash    Has patient had a PCN reaction causing immediate rash, facial/tongue/throat swelling, SOB or lightheadedness with hypotension: Yes Has patient had a PCN reaction causing severe rash involving mucus membranes or skin necrosis: No Has patient had a PCN reaction that required hospitalization: No Has patient had a PCN reaction occurring within the last 10 years: No If all of the above answers are "NO", then may proceed with Cephalosporin use.      The neurologically relevant items on the patient's problem list were reviewed on today's visit.  Neurologic Examination  A problem focused neurological exam (12 or more points of the single system neurologic examination, vital signs counts as 1 point, cranial nerves count for 8 points) was performed.  Blood pressure 128/81, pulse 89, height 5\' 3"  (1.6 m), weight 185 lb (83.9 kg).  General - Well nourished, well developed, in no apparent distress.  Ophthalmologic - Fundi not visualized due to eye movement.  Cardiovascular - Regular rate and rhythm with no murmur.  Mental Status -  Level of arousal and orientation to time, place, and person were intact. Language including expression, naming, repetition, comprehension was assessed and found intact. Fund of Knowledge was assessed and  was intact.  Cranial Nerves II - XII - II - Visual field right lower quadrantanopsia. III, IV, VI - Extraocular movements intact. V - Facial sensation intact bilaterally. VII - Facial movement intact bilaterally. VIII - Hearing &  vestibular intact bilaterally. X - Palate elevates symmetrically. XI - Chin turning & shoulder shrug intact bilaterally. XII - Tongue protrusion intact.  Motor Strength - The patient's strength was normal in all extremities except right hand mild dexterity difficulties and pronator drift was absent.  Bulk was normal and fasciculations were absent.   Motor Tone - Muscle tone was assessed at the neck and appendages and was normal.  Reflexes - The patient's reflexes were 1+ in all extremities and she had no pathological reflexes.  Sensory - Light touch, temperature/pinprick were assessed and were normal.    Coordination - The patient had normal movements in the hands with no ataxia or dysmetria.  Tremor was absent.  Gait and Station - small steps, mild stooped posturing.   Functional score  mRS = 2   0 - No symptoms.   1 - No significant disability. Able to carry out all usual activities, despite some symptoms.   2 - Slight disability. Able to look after own affairs without assistance, but unable to carry out all previous activities.   3 - Moderate disability. Requires some help, but able to walk unassisted.   4 - Moderately severe disability. Unable to attend to own bodily needs without assistance, and unable to walk unassisted.   5 - Severe disability. Requires constant nursing care and attention, bedridden, incontinent.   6 - Dead.   NIH Stroke Scale   Level Of Consciousness 0=Alert; keenly responsive 1=Not alert, but arousable by minor stimulation 2=Not alert, requires repeated stimulation 3=Responds only with reflex movements 0  LOC Questions to Month and Age 45=Answers both questions correctly 1=Answers one question correctly 2=Answers neither question correctly 0  LOC Commands      -Open/Close eyes     -Open/close grip 0=Performs both tasks correctly 1=Performs one task correctly 2=Performs neighter task correctly 0  Best Gaze 0=Normal 1=Partial gaze  palsy 2=Forced deviation, or total gaze paresis 0  Visual 0=No visual loss 1=Partial hemianopia 2=Complete hemianopia 3=Bilateral hemianopia (blind including cortical blindness) 1  Facial Palsy 0=Normal symmetrical movement 1=Minor paralysis (asymmetry) 2=Partial paralysis (lower face) 3=Complete paralysis (upper and lower face) 0  Motor  0=No drift, limb holds posture for full 10 seconds 1=Drift, limb holds posture, no drift to bed 2=Some antigravity effort, cannot maintain posture, drifts to bed 3=No effort against gravity, limb falls 4=No movement Right Arm 0     Leg 0    Left Arm 0     Leg 0  Limb Ataxia 0=Absent 1=Present in one limb 2=Present in two limbs 0  Sensory 0=Normal 1=Mild to moderate sensory loss 2=Severe to total sensory loss 0  Best Language 0=No aphasia, normal 1=Mild to moderate aphasia 2=Mute, global aphasia 3=Mute, global aphasia 0  Dysarthria 0=Normal 1=Mild to moderate 2=Severe, unintelligible or mute/anarthric 0  Extinction/Neglect 0=No abnormality 1=Extinction to bilateral simultaneous stimulation 2=Profound neglect 0  Total   1     Data reviewed: I personally reviewed the images and agree with the radiology interpretations.  Ct Angio Head W Or Wo Contrast Ct Angio Neck W Or Wo Contrast 03/09/2017 1. Left proximal P2 occlusion with weak downstream reconstitution.  2. Moderate atheromatous narrowing at the left V4 segment.  3. Atherosclerosis without flow limiting stenosis in the anterior  circulation.   Mr Brain Wo Contrast 03/09/2017 Acute to subacute nonhemorrhagic LEFT PCA territory infarct. Moderate chronic small vessel ischemic disease and old RIGHT basal ganglia infarct.  Ct Head Code Stroke W/o Cm 03/09/2017 1. Moderate infarct in the left occipital lobe correlating with history of subacute visual symptoms. No hemorrhagic conversion.  2. No visible MCA or ACA cortical infarct in this patient with weakness.  3. Moderate chronic  small vessel ischemia.  TTE  Left ventricle: The cavity size was normal. Systolic function was normal. The estimated ejection fraction was in the range of 60% to 65%. Wall motion was normal; there were no regional wall motion abnormalities. Left ventricular diastolic function parameters were normal. - Aortic valve: There was very mild stenosis. There was no regurgitation. Peak velocity (S): 211 cm/s. Mean gradient (S): 10 mm Hg. Valve area (VTI): 1.49 cm^2. Valve area (Vmax): 1.36 cm^2. Valve area (Vmean): 1.34 cm^2. - Mitral valve: Transvalvular velocity was within the normal range. There was no evidence for stenosis. There was no regurgitation. - Right ventricle: The cavity size was normal. Wall thickness was normal. Systolic function was normal. - Atrial septum: No defect or patent foramen ovale was identified by color flow Doppler. - Tricuspid valve: There was mild regurgitation. - Pulmonary arteries: Systolic pressure was within the normal range. PA peak pressure: 23 mm Hg (S).  LE venous doppler - No evidence of DVT or baker's cyst.  TEE not successful due to esophageal stricture  Component     Latest Ref Rng & Units 03/09/2017 03/09/2017 03/10/2017         2:46 PM  7:32 PM   Cholesterol     0 - 200 mg/dL 330 (H) 319 (H)   Triglycerides     <150 mg/dL 188 (H) 162 (H)   HDL Cholesterol     >40 mg/dL 51 45   Total CHOL/HDL Ratio     RATIO 6.5 7.1   VLDL     0 - 40 mg/dL 38 32   LDL (calc)     0 - 99 mg/dL 241 (H) 242 (H)   Hemoglobin A1C     4.8 - 5.6 % 5.5 5.6   Mean Plasma Glucose     mg/dL 111 114   TSH     0.350 - 4.500 uIU/mL  2.151 2.100  Vitamin B12     180 - 914 pg/mL   334  HIV Screen 4th Generation wRfx     Non Reactive   Non Reactive  RPR     Non Reactive   Non Reactive    Assessment: As you may recall, she is a 83 y.o. Caucasian female with PMH of HLD, HTN, migraine equivalent, right femoral artery stenosis concerning for  embolic event s/p stenting with subsequent occlusion, hypothyroidism, and glaucoma admitted on 03/09/17 for right hemianopia. MRI showed left PCA infarct and old right BG infarct. CTA h/n showed left proximal P2 occlusion. EF 60-65%. TEE not successful but loop recorder placed. LDL 242 and A1C 5.6. She was put on plavix due to ASA allergy and pravastatin which was later discontinued due to intolerance. Recommend PCSK9 inhibitors as outpt due to multiple statin intolerance. Will do repatha PA. Right hemianopia improvement to right lower quadrantanopia. Loop recorder so far negative.   Plan:  - continue plavix for stroke prevention - will continue to follow up with loop recorder - will do prior authorization for repatha injection of HLD due to statin intolerance - follow up with  ophthalmology to clear you for driving - Follow up with your primary care physician for stroke risk factor modification. Recommend maintain blood pressure goal <140/80, diabetes with hemoglobin A1c goal below 7.0% and lipids with LDL cholesterol goal below 70 mg/dL.  - check BP at home and record - healthy diet and regular exercise - follow up in 4 months  No orders of the defined types were placed in this encounter.   Meds ordered this encounter  Medications  . Evolocumab (REPATHA) 140 MG/ML SOSY    Sig: Inject 140 mg into the skin every 14 (fourteen) days.    Dispense:  2 mL    Refill:  2    Patient Instructions  - continue plavix for stroke prevention - will continue to follow up with loop recorder - will do prior authorization for repatha injection of right cholesterol - follow up with your eye doctor to see if you are safe to drive and clear you for driving - Follow up with your primary care physician for stroke risk factor modification. Recommend maintain blood pressure goal <140/80, diabetes with hemoglobin A1c goal below 7.0% and lipids with LDL cholesterol goal below 70 mg/dL.  - check Bp at home and  record - healthy diet and regular exercise - follow up in 4 months    Rosalin Hawking, MD PhD [facility] [address], [place], [place] [address] [phone] This is an as needed

## 2017-05-24 NOTE — Therapy (Signed)
[place] [address] [place], [place], [address] Phone: [phone]   Fax:  [phone]  Patient Details  Name: Alyssa Odonnell MRN: [id number] Date of Birth: 04-11-1934 Referring Provider:  Rosalin Hawking, MD  Encounter Date: 05/24/2017   PHYSICAL THERAPY DISCHARGE SUMMARY  Visits from Start of Care: 2  Current functional level related to goals / functional outcomes: Unknown as patient has not returned since 03/24/2017.    Remaining deficits: Unknown as patient did not return for formal discharge.    Education / Equipment: HEP initiated  Plan: Patient agrees to discharge.  Patient goals were not met. Patient is being discharged due to not returning since the last visit.  ?????         Kazandra Forstrom PT, DPT 05/24/2017, 9:55 AM  [place] [address] [place], [place], [address] Phone: [phone]   Fax:  [phone]

## 2017-05-27 ENCOUNTER — Ambulatory Visit: Payer: Medicare Other | Admitting: Physical Therapy

## 2017-06-01 ENCOUNTER — Other Ambulatory Visit: Payer: Self-pay

## 2017-06-01 DIAGNOSIS — E7800 Pure hypercholesterolemia, unspecified: Secondary | ICD-10-CM

## 2017-06-01 MED ORDER — EVOLOCUMAB 140 MG/ML ~~LOC~~ SOSY: 140.0000 mg | PREFILLED_SYRINGE | SUBCUTANEOUS | 2 refills | Status: DC

## 2017-06-09 ENCOUNTER — Ambulatory Visit (INDEPENDENT_AMBULATORY_CARE_PROVIDER_SITE_OTHER): Payer: Medicare Other | Admitting: *Deleted

## 2017-06-09 DIAGNOSIS — I639 Cerebral infarction, unspecified: Secondary | ICD-10-CM

## 2017-06-10 NOTE — Progress Notes (Signed)
Carelink Summary Report / Loop Recprder

## 2017-06-15 LAB — CUP PACEART REMOTE DEVICE CHECK
Date Time Interrogation Session: 20180822203626
Implantable Pulse Generator Implant Date: 20180524

## 2017-06-22 ENCOUNTER — Telehealth: Payer: Self-pay

## 2017-06-22 NOTE — Telephone Encounter (Signed)
REPATHA INJECTION PA application,and required forms sent to [facility] at [phone] via fax.

## 2017-07-05 NOTE — Telephone Encounter (Signed)
[facility]n call [facility] about pt being approve on 06/30/2017. Repatha. Pts copayment is [phone]. The customer service rep stated pt will be call about the financial aid program to cover the copayment. Pts Repatha was approve till 12/24/2017.

## 2017-07-06 NOTE — Telephone Encounter (Signed)
REPATHA approve sent to medical records for scanning.

## 2017-07-09 ENCOUNTER — Ambulatory Visit (INDEPENDENT_AMBULATORY_CARE_PROVIDER_SITE_OTHER): Payer: Medicare Other | Admitting: *Deleted

## 2017-07-09 DIAGNOSIS — I639 Cerebral infarction, unspecified: Secondary | ICD-10-CM

## 2017-07-12 LAB — CUP PACEART REMOTE DEVICE CHECK
Date Time Interrogation Session: 20180921213632
Implantable Pulse Generator Implant Date: 20180524

## 2017-07-12 NOTE — Progress Notes (Signed)
Carelink Summary Report / Loop Recorder 

## 2017-08-09 ENCOUNTER — Encounter (INDEPENDENT_AMBULATORY_CARE_PROVIDER_SITE_OTHER): Payer: Self-pay

## 2017-08-09 ENCOUNTER — Ambulatory Visit (INDEPENDENT_AMBULATORY_CARE_PROVIDER_SITE_OTHER): Payer: Medicare Other | Admitting: *Deleted

## 2017-08-09 DIAGNOSIS — I639 Cerebral infarction, unspecified: Secondary | ICD-10-CM | POA: Diagnosis not present

## 2017-08-09 LAB — CUP PACEART REMOTE DEVICE CHECK
Date Time Interrogation Session: 20181021213547
Implantable Pulse Generator Implant Date: 20180524

## 2017-08-09 NOTE — Progress Notes (Signed)
Carelink Summary Report / Loop Recorder 

## 2017-09-01 ENCOUNTER — Telehealth: Payer: Self-pay | Admitting: Cardiology

## 2017-09-01 NOTE — Telephone Encounter (Signed)
Attempted to call pt b/c her home monitor has not updated since 08-15-2018. Pt answered the phone and she stated "Who the hell are you calling". I informed her of who I was and that I was calling from the doctor that placed the loop recorder in her chest (Dr. Lovena Le). Pt stated "I don't know what the hell the thing is for" and disconnected the call.

## 2017-09-06 ENCOUNTER — Telehealth: Payer: Self-pay | Admitting: *Deleted

## 2017-09-06 NOTE — Telephone Encounter (Signed)
Spoke with patient to request a manual Carelink transmission due to monitor that has been disconnected since 10/28.  Received "AF" episode alert but no ECG due to disconnection.  Patient reports the monitor was unplugged and she recently plugged it back in.  Patient requests a call back around 11am for assistance setting up the monitor.

## 2017-09-06 NOTE — Telephone Encounter (Signed)
Spoke with patient and assisted her with sending a manual Carelink transmission for review.  1 "AF" episode noted on 08/15/17.  ECG appears sinus tach with PACs.  Advised patient that I will review the episode with Dr. Lovena Le when he is back in the office.  Patient is agreeable to this plan and denies additional questions or concerns at this time.

## 2017-09-07 ENCOUNTER — Ambulatory Visit (INDEPENDENT_AMBULATORY_CARE_PROVIDER_SITE_OTHER): Payer: Medicare Other | Admitting: *Deleted

## 2017-09-07 DIAGNOSIS — I639 Cerebral infarction, unspecified: Secondary | ICD-10-CM

## 2017-09-08 NOTE — Progress Notes (Signed)
Carelink Summary Report / Loop Recorder 

## 2017-09-08 NOTE — Telephone Encounter (Signed)
Dr. Lovena Le reviewed episode--ECG shows sinus tach w/PACs.  Plan to continue monitoring via Carelink.

## 2017-09-22 ENCOUNTER — Other Ambulatory Visit: Payer: Self-pay | Admitting: Internal Medicine

## 2017-09-23 LAB — CUP PACEART REMOTE DEVICE CHECK
Date Time Interrogation Session: 20181120213616
Implantable Pulse Generator Implant Date: 20180524

## 2017-09-27 ENCOUNTER — Ambulatory Visit: Payer: Medicare Other | Admitting: Neurology

## 2017-10-07 ENCOUNTER — Ambulatory Visit (INDEPENDENT_AMBULATORY_CARE_PROVIDER_SITE_OTHER): Payer: Medicare Other | Admitting: *Deleted

## 2017-10-07 DIAGNOSIS — I639 Cerebral infarction, unspecified: Secondary | ICD-10-CM | POA: Diagnosis not present

## 2017-10-08 NOTE — Progress Notes (Signed)
Carelink Summary Report / Loop Recorder 

## 2017-10-21 LAB — CUP PACEART REMOTE DEVICE CHECK
Date Time Interrogation Session: 20181220213528
Implantable Pulse Generator Implant Date: 20180524

## 2017-11-04 ENCOUNTER — Other Ambulatory Visit: Payer: Self-pay

## 2017-11-04 ENCOUNTER — Ambulatory Visit (HOSPITAL_COMMUNITY)
Admission: RE | Admit: 2017-11-04 | Discharge: 2017-11-04 | Disposition: A | Discharge status: Home / Self Care | Payer: Medicare Other | Source: Ambulatory Visit | Attending: Family | Admitting: Family

## 2017-11-04 ENCOUNTER — Encounter: Payer: Self-pay | Admitting: Family

## 2017-11-04 ENCOUNTER — Ambulatory Visit (INDEPENDENT_AMBULATORY_CARE_PROVIDER_SITE_OTHER): Payer: Medicare Other | Admitting: Family

## 2017-11-04 VITALS — BP 137/83 | HR 76 | Temp 96.9°F | Resp 16 | Ht 63.0 in | Wt 186.0 lb

## 2017-11-04 DIAGNOSIS — Z48812 Encounter for surgical aftercare following surgery on the circulatory system: Secondary | ICD-10-CM

## 2017-11-04 DIAGNOSIS — I779 Disorder of arteries and arterioles, unspecified: Secondary | ICD-10-CM | POA: Diagnosis not present

## 2017-11-04 DIAGNOSIS — R9439 Abnormal result of other cardiovascular function study: Secondary | ICD-10-CM | POA: Insufficient documentation

## 2017-11-04 DIAGNOSIS — I739 Peripheral vascular disease, unspecified: Secondary | ICD-10-CM | POA: Insufficient documentation

## 2017-11-04 NOTE — Patient Instructions (Signed)

## 2017-11-04 NOTE — Progress Notes (Signed)
[facility] [place]   CC: Follow up peripheral artery occlusive disease  History of Present Illness Alyssa Odonnell is a 84 y.o. female who presented with acute onset of discoloration of her toes in the right foot. Look like she had an embolic event. She underwent an arteriogram which showed a focal 90% stenosis of the superficial femoral artery with a filling defect suggesting clot. This was felt to be the source of embolization and Dr. Scot Dock performed angioplasty and stenting of this. On 07/22/2016 her ABI was 92% on the right with a biphasic posterior tibial signal and a triphasic dorsalis pedis signal. ABI of the left was 70%.   She states that she has been doing well and denies any claudication, rest pain, or nonhealing ulcers. Her only complaint is her right leg gives out at times because of spinal stenosis. She is considering having injections for her back.  She has been on Plavix because of her stent.  Dr. Scot Dock last evaluated Alyssa Odonnell on 10-28-16. At that time her right superficial femoral artery stent was occluded. She was asymptomatic. Dr. Scot Dock encouraged her to stay as active as possible and since her stent is occluded Dr. Scot Dock did not think she needs to stay on Plavix.  ABIs in 1 year for continued follow up of her peripheral vascular disease. Since she is asymptomatic, and given her age, would not recommend any further workup unless she becomes symptomatic in the future.  She has OA pain in all of her joints when she walks, she does not seem to have claudication sx's with walking.   She had a cryptogenic stroke in May 2018, states it was found that she had temporary atrial fib.  Residual neurological deficits include mild limited coordination of right hand, visual disturbances, and mild cognitive issues.      Diabetic: No Tobacco use: non-smoker  Alyssa Odonnell meds include: Statin :Yes Betablocker: No ASA: Yes Other anticoagulants/antiplatelets: Plavix  Past  Medical History:  Diagnosis Date  . Glaucoma   . Hyperlipidemia   . Hypertension   . Hypothyroid   . Stroke [facility])     Social History Social History   Tobacco Use  . Smoking status: Never Smoker  . Smokeless tobacco: Never Used  Substance Use Topics  . Alcohol use: No  . Drug use: No    Family History Family History  Problem Relation Age of Onset  . Stroke Father     Past Surgical History:  Procedure Laterality Date  . ABDOMINAL HERNIA REPAIR    . LOOP RECORDER INSERTION N/A 03/11/2017   Procedure: Loop Recorder Insertion;  Surgeon: Evans Lance, MD;  Location: [place] [facility];  Service: Cardiovascular;  Laterality: N/A;  . PERIPHERAL VASCULAR CATHETERIZATION Right 07/06/2016   Procedure: Lower Extremity Angiography;  Surgeon: Angelia Mould, MD;  Location: [place] [facility];  Service: Cardiovascular;  Laterality: Right;  . PERIPHERAL VASCULAR CATHETERIZATION Right 07/06/2016   Procedure: Peripheral Vascular Intervention;  Surgeon: Angelia Mould, MD;  Location: [place] [facility];  Service: Cardiovascular;  Laterality: Right;  Superficial femoral  . TEE WITHOUT CARDIOVERSION N/A 03/11/2017   Procedure: TRANSESOPHAGEAL ECHOCARDIOGRAM (TEE);  Surgeon: Acie Fredrickson Wonda Cheng, MD;  Location: [facility] ENDOSCOPY;  Service: Cardiovascular;  Laterality: N/A;  . TOTAL HIP ARTHROPLASTY      Allergies  Allergen Reactions  . Aspirin Other (See Comments)    Red splotches  . Atorvastatin Other (See Comments)    Muscle spams  . Nabumetone Itching  .  Nsaids     Reaction (??)  . Pravastatin Other (See Comments)    Stiff walking difficulty Stiff walking difficulty  . Procaine Swelling  . Rosuvastatin Nausea Only and Other (See Comments)    Reflux and Muscle stiffness  . Simvastatin Nausea Only and Other (See Comments)    Reflux and Muscle stiffness  . Statins Other (See Comments)  . Mobic [Meloxicam] Rash  . Penicillins Rash    Has patient had a PCN reaction causing  immediate rash, facial/tongue/throat swelling, SOB or lightheadedness with hypotension: Yes Has patient had a PCN reaction causing severe rash involving mucus membranes or skin necrosis: No Has patient had a PCN reaction that required hospitalization: No Has patient had a PCN reaction occurring within the last 10 years: No If all of the above answers are "NO", then may proceed with Cephalosporin use.      Current Outpatient Medications  Medication Sig Dispense Refill  . acetaminophen (TYLENOL) 500 MG tablet Take 1,000 mg by mouth every morning.     . clopidogrel (PLAVIX) 75 MG tablet Take 1 tablet (75 mg total) by mouth daily. 30 tablet 5  . furosemide (LASIX) 40 MG tablet Take 40 mg by mouth daily as needed for edema. (swelling in feet)    . latanoprost (XALATAN) 0.005 % ophthalmic solution Place 1 drop into both eyes at bedtime.    Marland Kitchen levothyroxine (SYNTHROID, LEVOTHROID) 100 MCG tablet Take 100 mcg by mouth daily before breakfast.     . olmesartan-hydrochlorothiazide (BENICAR HCT) 20-12.5 MG tablet Take 1 tablet by mouth daily.    Marland Kitchen omeprazole (PRILOSEC) 20 MG capsule Take 20 mg by mouth daily before breakfast.    . ondansetron (ZOFRAN ODT) 4 MG disintegrating tablet Take 1 tablet (4 mg total) by mouth every 8 (eight) hours as needed for nausea or vomiting. 10 tablet 0  . sertraline (ZOLOFT) 100 MG tablet Take 150 mg by mouth every morning.    . triamcinolone cream (KENALOG) 0.1 % Apply 1 application topically daily as needed for itching.    . Evolocumab (REPATHA) 140 MG/ML SOSY Inject 140 mg into the skin every 14 (fourteen) days. (Patient not taking: Reported on 11/04/2017) 2 mL 2  . pravastatin (PRAVACHOL) 20 MG tablet Take 1 tablet (20 mg total) by mouth daily at 6 PM. (Patient not taking: Reported on 11/04/2017) 60 tablet 0   No current facility-administered medications for this visit.     ROS: See HPI for pertinent positives and negatives.   Physical Examination  Vitals:    11/04/17 1343  BP: 137/83  Pulse: 76  Resp: 16  Temp: (!) 96.9 F (36.1 C)  TempSrc: Oral  SpO2: 98%  Weight: 186 lb (84.4 kg)  Height: 5\' 3"  (1.6 m)   Body mass index is 32.95 kg/m.  General: A&O x 3, WDWN, obese female. Gait: slow, steady, using cane Eyes: PERRLA. Pulmonary: Respirations are non labored, CTAB, good air movement in all fields Cardiac: regular rhythm, no detected murmur.         Carotid Bruits Right Left   Negative Negative   Radial pulses are 1+ palpable bilaterally   Adominal aortic pulse is not palpable                         VASCULAR EXAM: Extremities without ischemic changes, without Gangrene; without open wounds.  LE Pulses Right Left       FEMORAL  not palpable seated, obese  not palpable seated, obes        POPLITEAL  not palpable   not palpable       POSTERIOR TIBIAL  not palpable   not palpable        DORSALIS PEDIS      ANTERIOR TIBIAL not palpable  not palpable    Abdomen: soft, NT, no palpable masses. Skin: no rashes, no ulcers noted. Musculoskeletal: no muscle wasting or atrophy. OA deformities in joints of hands.   Neurologic: A&O X 3; Appropriate Affect ; SENSATION: normal; MOTOR FUNCTION:  moving all extremities equally, motor strength 5/5 throughout. Speech is fluent/normal. CN 2-12 intact. Psychiatric: Rambling, coherent thought content, mood appropriate for clinical situation.     ASSESSMENT: RYANN PAULI is a 84 y.o. female who is s/p angioplasty and stenting of right SFA on 07-06-16.  At her visit on  10-28-16 her right superficial femoral artery stent was noted to be occluded. She remains asymptomatic however. Her walking is limited by OA pain in most of her joints.  There are no signs of ischemia in her feet or legs.   DATA  ABI (Date: 11/04/2017):  R:   ABI: 0.68 (was 0.72 on 10-28-16),   Alyssa Odonnell: bi  DP:  bi  TBI:  Toe pressure: 76  L:   ABI: 0.74 (was 0.74),   Alyssa Odonnell: bi  DP: bi  TBI: toe pressure: 38  Bilateral ABI remain stable with moderate disease bilaterally, all biphasic waveforms.    CTA head and neck 03-09-17: Right carotid system: Moderate calcified plaque at the common carotid bifurcation and ICA bulb without stenosis, ulceration, or dissection. Left carotid system: Moderate predominately noncalcified plaque at the common carotid bifurcation and ICA bulb without flow limiting stenosis, ulceration, or dissection. Vertebral arteries: No brachiocephalic or proximal subclavian flow limiting stenosis. Left dominant vertebral artery. Both vessels are smooth and widely patent to the dura. 1. Left proximal P2 occlusion with weak downstream reconstitution. 2. Moderate atheromatous narrowing at the left V4 segment. 3. Atherosclerosis without flow limiting stenosis in the anterior Circulation.   PLAN:  Seated daily leg exercises discussed and demonstrated.  Based on the patient's vascular studies and examination, Alyssa Odonnell will return to clinic in 1 year with ABI's. I advised her to notify us if she develops concerns re the circulation of her feet or legs.   I discussed in depth with the patient the nature of atherosclerosis, and emphasized the importance of maximal medical management including strict control of blood pressure, blood glucose, and lipid levels, obtaining regular exercise, and continued cessation of smoking.  The patient is aware that without maximal medical management the underlying atherosclerotic disease process will progress, limiting the benefit of any interventions.  The patient was given information about PAD including signs, symptoms, treatment, what symptoms should prompt the patient to seek immediate medical care, and risk reduction measures to take.  Clemon Chambers, RN, MSN, FNP-C [facility] Phone:  [phone]  Clinic MD: Oneida Alar  11/04/17 1:50 PM

## 2017-11-08 ENCOUNTER — Ambulatory Visit (INDEPENDENT_AMBULATORY_CARE_PROVIDER_SITE_OTHER): Payer: Medicare Other | Admitting: *Deleted

## 2017-11-08 DIAGNOSIS — I639 Cerebral infarction, unspecified: Secondary | ICD-10-CM | POA: Diagnosis not present

## 2017-11-08 NOTE — Progress Notes (Signed)
Carelink Summary Report / Loop Recorder 

## 2017-11-16 LAB — CUP PACEART REMOTE DEVICE CHECK
Date Time Interrogation Session: 20190119223624
Implantable Pulse Generator Implant Date: 20180524

## 2017-12-06 ENCOUNTER — Ambulatory Visit (INDEPENDENT_AMBULATORY_CARE_PROVIDER_SITE_OTHER): Payer: Medicare Other | Admitting: *Deleted

## 2017-12-06 DIAGNOSIS — I639 Cerebral infarction, unspecified: Secondary | ICD-10-CM

## 2017-12-07 ENCOUNTER — Ambulatory Visit: Payer: Self-pay | Admitting: Neurology

## 2017-12-08 ENCOUNTER — Ambulatory Visit: Payer: Medicare Other | Admitting: Neurology

## 2017-12-08 ENCOUNTER — Telehealth: Payer: Self-pay

## 2017-12-08 NOTE — Progress Notes (Signed)
Carelink Summary Report / Loop Recorder 

## 2017-12-08 NOTE — Telephone Encounter (Signed)
Patient no show today. Cancel the same day because of weather.Appt was at 0330pm.

## 2017-12-09 ENCOUNTER — Encounter: Payer: Self-pay | Admitting: Neurology

## 2018-01-03 LAB — CUP PACEART REMOTE DEVICE CHECK
Date Time Interrogation Session: 20190219213635
Implantable Pulse Generator Implant Date: 20180524

## 2018-01-10 ENCOUNTER — Ambulatory Visit (INDEPENDENT_AMBULATORY_CARE_PROVIDER_SITE_OTHER): Payer: Medicare Other | Admitting: *Deleted

## 2018-01-10 DIAGNOSIS — I639 Cerebral infarction, unspecified: Secondary | ICD-10-CM | POA: Diagnosis not present

## 2018-01-10 NOTE — Progress Notes (Signed)
Carelink Summary Report / Loop Recorder 

## 2018-01-19 ENCOUNTER — Encounter: Payer: Self-pay | Admitting: Adult Health

## 2018-01-19 ENCOUNTER — Telehealth: Payer: Self-pay | Admitting: Adult Health

## 2018-01-19 ENCOUNTER — Ambulatory Visit (INDEPENDENT_AMBULATORY_CARE_PROVIDER_SITE_OTHER): Payer: Medicare Other | Admitting: Adult Health

## 2018-01-19 VITALS — BP 137/76 | HR 90 | Ht 63.0 in | Wt 184.0 lb

## 2018-01-19 DIAGNOSIS — I779 Disorder of arteries and arterioles, unspecified: Secondary | ICD-10-CM | POA: Diagnosis not present

## 2018-01-19 DIAGNOSIS — R413 Other amnesia: Secondary | ICD-10-CM | POA: Diagnosis not present

## 2018-01-19 DIAGNOSIS — I63432 Cerebral infarction due to embolism of left posterior cerebral artery: Secondary | ICD-10-CM | POA: Diagnosis not present

## 2018-01-19 DIAGNOSIS — I10 Essential (primary) hypertension: Secondary | ICD-10-CM | POA: Diagnosis not present

## 2018-01-19 DIAGNOSIS — E785 Hyperlipidemia, unspecified: Secondary | ICD-10-CM

## 2018-01-19 NOTE — Progress Notes (Signed)
I reviewed above note and agree with the assessment and plan.   Rosalin Hawking, MD PhD Stroke Neurology 01/19/2018 9:53 PM

## 2018-01-19 NOTE — Telephone Encounter (Signed)
4/3 Pt stated that she did not want 6 month f/u appt at check out, she stated that she would rather check with her PCP to find a neurologist closer to where she lives

## 2018-01-19 NOTE — Patient Instructions (Signed)
Continue clopidogrel 75 mg daily  for secondary stroke prevention  Continue to follow up with PCP regarding blood pressure and cholesterol  We will do a pre-authorization Praluent (cholesterol injection)  Continue to monitor loop recorder  Cognitive exercises to help with memory loss   Maintain strict control of hypertension with blood pressure goal below 130/90, diabetes with hemoglobin A1c goal below 6.5% and cholesterol with LDL cholesterol (bad cholesterol) goal below 70 mg/dL. I also advised the patient to eat a healthy diet with plenty of whole grains, cereals, fruits and vegetables, exercise regularly and maintain ideal body weight.  Followup in the future with me in 6 months    Cognitive Tips  Keep a journal/notebook with sections for the following (or use sections separately as needed):  Calendar and appointment sheet, schedule for each day, lists of reminders (such as grocery lists or "to do" list), homework assignments for therapy, important information such as family and friends names / addresses / phone numbers, medications, medical history and doctors name / phone numbers.  Avoid / remove clutter and unnecessary items from areas such as countertops / cabinets in kitchen and bathroom, closets, etc.  Organize items by purpose.  Baskets and bins help with this.  Leave notes for reminders above task to be completed.  For example: Note to turn off stove over the the stove; note to lock door beside the door, not to brush teeth then wash face by sink, note to take medication on table etc.  To help recall names of people of people or things, mentally or verbally go through the alphabet to try to determine the 1st letter of the word as this may trigger the name or word you are looking for.  If this is too difficult and someone else knows the word, have them give you the first letter by asking, "does it start with an "A", "B", "C" etc. (or have them give you the first sound of the word  or some other clue).  Review family events, occasions, names, etc.  Pictures are a good way to trigger memory.  Have others correct you if you answer something incorrectly.  Have them speak slowly with a few words to give you time to process and respond.  Don't let others automatically problem-solve. (For example: don't let them automatically lay out clothes in the correct position, but hand it to you folded so that you can figure it out for yourself.)  However, if you need help with tasks, they should give you as little as they can so that you can be successful.  If appropriate and safe, they may allow you to make mistakes so that you can figure out how to correct the error.  (For example, they may allow you to put your shoes on the wrong foot to see if you notice that is wrong).  If you struggle, they should give you a cue.  (Example: "Do your shoes feel right?"  "Do they look right?")

## 2018-01-19 NOTE — Progress Notes (Signed)
STROKE NEUROLOGY FOLLOW UP NOTE  NAME: Alyssa Odonnell DOB: 07-Nov-1933  REASON FOR VISIT: stroke follow up HISTORY FROM: pt and chart  Today we had the pleasure of seeing Alyssa Odonnell in follow-up at our Neurology Clinic. Pt was accompanied by no one.   History Summary Alyssa Odonnell is a 84 y.o. female with history of HLD, HTN, migraine equivalent, right femoral artery stenosis s/p stenting with subsequent occlusion, hypothyroidism, and glaucoma admitted on 03/09/17 for right hemianopia. MRI showed left PCA infarct and old right BG infarct. CTA h/n showed left proximal P2 occlusion. EF 60-65%. TEE not successful but loop recorder placed. LDL 242 and A1C 5.6. She was put on plavix due to ASA allergy and pravastatin. Recommend PCSK9 inhibitors as outpt.   History 05/24/17: During the interval time, the patient has been doing well. Right hemianopia improvement to right lower quadrantanopia. She drove over for clinic today and was encouraged to follow up with ophthalmology to clear for driving. Loop recorder so far negative. Unable to tolerate pravastatin which was discontinued. She has statin intolerance for Crestor, Zocor, and pravastatin with muscle stiffness. BP today 128/81.  Update 01/19/18: Patient returns today for six-month follow-up.  Overall she is doing well but does continue to have bilateral inferior quadrantanopia.  She was cleared by her ophthalmologist to drive but she has not been driving.  She attempted to use Repatha but after one use developed a rash all over her body and she has not used it since.  Continues to use PLavix without increase in bleeding or bruising.  Her loop recorder has not shown atrial fibrillation thus far.  She does complain of short-term memory issues.  Blood pressure today stable at 137/76.  No new or worsening stroke/TIA symptoms.    REVIEW OF SYSTEMS: Full 14 system review of systems performed and notable only for those listed below and in HPI above,  all others are negative: Loss of vision, frequent waking, joint pain, joint swelling, back pain, muscle cramps, walking difficulty, moles, itching   The following represents the patient's updated allergies and side effects list: Allergies  Allergen Reactions  . Aspirin Other (See Comments)    Red splotches  . Atorvastatin Other (See Comments)    Muscle spams  . Nabumetone Itching  . Nsaids     Reaction (??)  . Pravastatin Other (See Comments)    Stiff walking difficulty Stiff walking difficulty  . Procaine Swelling  . Rosuvastatin Nausea Only and Other (See Comments)    Reflux and Muscle stiffness  . Simvastatin Nausea Only and Other (See Comments)    Reflux and Muscle stiffness  . Statins Other (See Comments)  . Mobic [Meloxicam] Rash  . Penicillins Rash    Has patient had a PCN reaction causing immediate rash, facial/tongue/throat swelling, SOB or lightheadedness with hypotension: Yes Has patient had a PCN reaction causing severe rash involving mucus membranes or skin necrosis: No Has patient had a PCN reaction that required hospitalization: No Has patient had a PCN reaction occurring within the last 10 years: No If all of the above answers are "NO", then may proceed with Cephalosporin use.      The neurologically relevant items on the patient's problem list were reviewed on today's visit.  Neurologic Examination  A problem focused neurological exam (12 or more points of the single system neurologic examination, vital signs counts as 1 point, cranial nerves count for 8 points) was performed.  Blood pressure 137/76, pulse 90,  height 5\' 3"  (1.6 m), weight 184 lb (83.5 kg).  General - Well nourished, pleasant elderly Caucasian female, well developed, in no apparent distress.  Ophthalmologic - Fundi not visualized due to eye movement.  Cardiovascular - Regular rate and rhythm with no murmur.  Mental Status -  Level of arousal and orientation to time, place, and person  were intact. Language including expression, naming, repetition, comprehension was assessed and found intact. Fund of Knowledge was assessed and was intact. Clock drawing 4/4, AFT 15, recall 2/3, able to perform serial additions  Cranial Nerves II - XII - II - Visual field right inferior quadrantanopsia. III, IV, VI - Extraocular movements intact. V - Facial sensation intact bilaterally. VII - Facial movement intact bilaterally. VIII - Hearing & vestibular intact bilaterally. X - Palate elevates symmetrically. XI - Chin turning & shoulder shrug intact bilaterally. XII - Tongue protrusion intact.  Motor Strength - The patient's strength was normal in all extremities except right hand mild dexterity difficulties and pronator drift was absent.  Bulk was normal and fasciculations were absent.   Motor Tone - Muscle tone was assessed at the neck and appendages and was normal.  Reflexes - The patient's reflexes were 1+ in all extremities and she had no pathological reflexes.  Sensory - Light touch, temperature/pinprick were assessed and were normal.    Coordination - The patient had normal movements in the hands with no ataxia or dysmetria.  Tremor was absent.  Gait and Station - small steps, mild stooped posturing.    Data reviewed: I personally reviewed the images and agree with the radiology interpretations.  Ct Angio Head W Or Wo Contrast Ct Angio Neck W Or Wo Contrast 03/09/2017 1. Left proximal P2 occlusion with weak downstream reconstitution.  2. Moderate atheromatous narrowing at the left V4 segment.  3. Atherosclerosis without flow limiting stenosis in the anterior circulation.   Mr Brain Wo Contrast 03/09/2017 Acute to subacute nonhemorrhagic LEFT PCA territory infarct. Moderate chronic small vessel ischemic disease and old RIGHT basal ganglia infarct.  Ct Head Code Stroke W/o Cm 03/09/2017 1. Moderate infarct in the left occipital lobe correlating with history of  subacute visual symptoms. No hemorrhagic conversion.  2. No visible MCA or ACA cortical infarct in this patient with weakness.  3. Moderate chronic small vessel ischemia.  TTE  Left ventricle: The cavity size was normal. Systolic function was normal. The estimated ejection fraction was in the range of 60% to 65%. Wall motion was normal; there were no regional wall motion abnormalities. Left ventricular diastolic function parameters were normal. - Aortic valve: There was very mild stenosis. There was no regurgitation. Peak velocity (S): 211 cm/s. Mean gradient (S): 10 mm Hg. Valve area (VTI): 1.49 cm^2. Valve area (Vmax): 1.36 cm^2. Valve area (Vmean): 1.34 cm^2. - Mitral valve: Transvalvular velocity was within the normal range. There was no evidence for stenosis. There was no regurgitation. - Right ventricle: The cavity size was normal. Wall thickness was normal. Systolic function was normal. - Atrial septum: No defect or patent foramen ovale was identified by color flow Doppler. - Tricuspid valve: There was mild regurgitation. - Pulmonary arteries: Systolic pressure was within the normal range. PA peak pressure: 23 mm Hg (S).  LE venous doppler - No evidence of DVT or baker's cyst.  TEE not successful due to esophageal stricture    Assessment: As you may recall, she is a 84 y.o. Caucasian female with left PCA territory infarct on 03/09/2017.  Vascular risk  factors HTN, and HLD.  Patient returns today for follow-up and overall doing well.  No new or worsening stroke/TIA symptoms  Plan:  -Continue clopidogrel 75 mg daily  for secondary stroke prevention -Patient willing to try Praluent injections but requesting PCP place the order -Continue to follow up with PCP regarding blood pressure and cholesterol -Continue to monitor loop recorder -Cognitive exercises to help with age-related memory loss -Maintain strict control of hypertension with blood pressure  goal below 130/90, diabetes with hemoglobin A1c goal below 6.5% and cholesterol with LDL cholesterol (bad cholesterol) goal below 70 mg/dL. I also advised the patient to eat a healthy diet with plenty of whole grains, cereals, fruits and vegetables, exercise regularly and maintain ideal body weight.  Followup in the future with me in 6 months   Greater than 50% time during this 25 minute consultation visit was spent on counseling and coordination of care about HTN, and HLD (risk factors), discussion about risk benefit of anticoagulation and answering questions.   Venancio Poisson, AGNP-BC  [facility] [address] [place], [place] [address]  Phone [phone] Fax [phone]

## 2018-02-01 ENCOUNTER — Telehealth: Payer: Self-pay

## 2018-02-01 DIAGNOSIS — I480 Paroxysmal atrial fibrillation: Secondary | ICD-10-CM

## 2018-02-02 DIAGNOSIS — I480 Paroxysmal atrial fibrillation: Secondary | ICD-10-CM | POA: Insufficient documentation

## 2018-02-02 MED ORDER — APIXABAN 5 MG PO TABS: 5.0000 mg | ORAL_TABLET | Freq: Two times a day (BID) | ORAL | 3 refills | Status: DC

## 2018-02-02 MED ORDER — APIXABAN 5 MG PO TABS: 5.0000 mg | ORAL_TABLET | Freq: Two times a day (BID) | ORAL | 0 refills | Status: DC

## 2018-02-02 NOTE — Telephone Encounter (Signed)
Spoke with patient to confirm that samples and trial card are at the front desk for pickup tomorrow or Friday.  She will have her brother pick them up.  Patient aware of signs/symptoms of bleeding and agrees to call if any noted.  She is appreciative and denies additional questions or concerns at this time.

## 2018-02-02 NOTE — Telephone Encounter (Signed)
Spoke with patient.  Advised her of Dr. Tanna Furry recommendations to STOP Plavix and START Eliquis 5mg  BID.  30-day supply prescription sent to local pharmacy for free trial card and 90-day supply sent to mail-order pharmacy per patient request.  Samples and trial card placed at front desk for patient's brother to pickup tomorrow.  Plan for BMET and CBC in 4 weeks, lab appointment scheduled.  Plan to f/u with Dr. Lovena Le on 03/30/18.  Reviewed medication instructions with patient.  She will call if she is unable to tolerate this medication for any reason.  She verbalizes understanding of all instructions and is appreciative of assistance.

## 2018-02-02 NOTE — Telephone Encounter (Signed)
New AF noted on patient's LINQ.  Episode occurred on 01/30/18 at 1635, duration 42min.  Attempted to reach patient but no VM set up.  Spoke with patient's brother, Bud (DPR), who states patient does not answer the phone if she does not recognize the number.  He will call her today and give her our phone number so that she will pick up.  He is aware that I will call him back if I am still unable to reach the patient.  Per Dr. Lovena Le, plan to START Eliquis 5mg  BID and STOP Plavix.  Plan for BMET and CBC in 4 weeks, f/u with Dr. Lovena Le in June.

## 2018-02-11 ENCOUNTER — Ambulatory Visit (INDEPENDENT_AMBULATORY_CARE_PROVIDER_SITE_OTHER): Payer: Medicare Other | Admitting: *Deleted

## 2018-02-11 DIAGNOSIS — I639 Cerebral infarction, unspecified: Secondary | ICD-10-CM

## 2018-02-14 NOTE — Progress Notes (Signed)
Carelink Summary Report / Loop Recorder 

## 2018-02-19 LAB — CUP PACEART REMOTE DEVICE CHECK
Date Time Interrogation Session: 20190324221036
Implantable Pulse Generator Implant Date: 20180524

## 2018-03-03 ENCOUNTER — Encounter (INDEPENDENT_AMBULATORY_CARE_PROVIDER_SITE_OTHER): Payer: Self-pay

## 2018-03-03 ENCOUNTER — Other Ambulatory Visit: Payer: Medicare Other | Admitting: *Deleted

## 2018-03-03 DIAGNOSIS — I480 Paroxysmal atrial fibrillation: Secondary | ICD-10-CM

## 2018-03-04 LAB — BASIC METABOLIC PANEL
BUN/Creatinine Ratio: 21 (ref 12–28)
BUN: 31 mg/dL — ABNORMAL HIGH (ref 8–27)
CO2: 23 mmol/L (ref 20–29)
Calcium: 9.6 mg/dL (ref 8.7–10.3)
Chloride: 102 mmol/L (ref 96–106)
Creatinine, Ser: 1.5 mg/dL — ABNORMAL HIGH (ref 0.57–1.00)
GFR calc Af Amer: 37 mL/min/{1.73_m2} — ABNORMAL LOW (ref >59)
GFR calc non Af Amer: 32 mL/min/{1.73_m2} — ABNORMAL LOW (ref >59)
Glucose: 83 mg/dL (ref 65–99)
Potassium: 4.5 mmol/L (ref 3.5–5.2)
Sodium: 141 mmol/L (ref 134–144)

## 2018-03-04 LAB — CBC WITH DIFFERENTIAL/PLATELET
Basophils Absolute: 0 10*3/uL (ref 0.0–0.2)
Basos: 0 %
EOS (ABSOLUTE): 0.2 10*3/uL (ref 0.0–0.4)
Eos: 2 %
Hematocrit: 37.7 % (ref 34.0–46.6)
Hemoglobin: 12.1 g/dL (ref 11.1–15.9)
Immature Grans (Abs): 0 10*3/uL (ref 0.0–0.1)
Immature Granulocytes: 0 %
Lymphocytes Absolute: 2.4 10*3/uL (ref 0.7–3.1)
Lymphs: 31 %
MCH: 30.3 pg (ref 26.6–33.0)
MCHC: 32.1 g/dL (ref 31.5–35.7)
MCV: 94 fL (ref 79–97)
Monocytes Absolute: 0.6 10*3/uL (ref 0.1–0.9)
Monocytes: 8 %
Neutrophils Absolute: 4.7 10*3/uL (ref 1.4–7.0)
Neutrophils: 59 %
Platelets: 211 10*3/uL (ref 150–379)
RBC: 4 x10E6/uL (ref 3.77–5.28)
RDW: 14.4 % (ref 12.3–15.4)
WBC: 7.9 10*3/uL (ref 3.4–10.8)

## 2018-03-07 ENCOUNTER — Other Ambulatory Visit: Payer: Self-pay | Admitting: Internal Medicine

## 2018-03-08 LAB — CUP PACEART REMOTE DEVICE CHECK
Date Time Interrogation Session: 20190426220801
Implantable Pulse Generator Implant Date: 20180524

## 2018-03-11 ENCOUNTER — Telehealth: Payer: Self-pay

## 2018-03-11 MED ORDER — APIXABAN 2.5 MG PO TABS: 2.5000 mg | ORAL_TABLET | Freq: Two times a day (BID) | ORAL | 3 refills | Status: DC

## 2018-03-11 NOTE — Telephone Encounter (Signed)
   Notes recorded by Dollene Primrose, RN on 03/11/2018 at 2:15 PM EDT Pt understands to take 2.5mg , half tablet, in the morning and 2.5mg , half tablet, in the evening. She had no additional questions. ------  Notes recorded by Evans Lance, MD on 03/08/2018 at 8:51 PM EDT Probably needs to reduce dose of eliquis. Lets discuss. GT ------  Notes recorded by Shellia Cleverly, RN on 03/04/2018 at 9:54 AM EDT Reviewed, will forward to MD for recommendations Was started on Eliquis 5 BID 02/02/18.

## 2018-03-16 ENCOUNTER — Ambulatory Visit (INDEPENDENT_AMBULATORY_CARE_PROVIDER_SITE_OTHER): Payer: Medicare Other | Admitting: *Deleted

## 2018-03-16 DIAGNOSIS — I639 Cerebral infarction, unspecified: Secondary | ICD-10-CM

## 2018-03-16 NOTE — Progress Notes (Signed)
Carelink Summary Report / Loop Recorder 

## 2018-03-30 ENCOUNTER — Encounter: Payer: Self-pay | Admitting: Internal Medicine

## 2018-03-30 ENCOUNTER — Ambulatory Visit (INDEPENDENT_AMBULATORY_CARE_PROVIDER_SITE_OTHER): Payer: Medicare Other | Admitting: Internal Medicine

## 2018-03-30 VITALS — BP 126/84 | HR 86 | Ht 63.0 in | Wt 184.0 lb

## 2018-03-30 DIAGNOSIS — M25569 Pain in unspecified knee: Secondary | ICD-10-CM

## 2018-03-30 DIAGNOSIS — I480 Paroxysmal atrial fibrillation: Secondary | ICD-10-CM

## 2018-03-30 DIAGNOSIS — I779 Disorder of arteries and arterioles, unspecified: Secondary | ICD-10-CM

## 2018-03-30 DIAGNOSIS — I639 Cerebral infarction, unspecified: Secondary | ICD-10-CM

## 2018-03-30 NOTE — Patient Instructions (Addendum)
Medication Instructions:  Your physician recommends that you continue on your current medications as directed. Please refer to the Current Medication list given to you today.  Labwork: None ordered.  Testing/Procedures: None ordered.  Follow-Up: Your physician wants you to follow-up in: one year with Dr. Lovena Le.   You will receive a reminder letter in the mail two months in advance. If you don't receive a letter, please call our office to schedule the follow-up appointment.   Any Other Special Instructions Will Be Listed Below (If Applicable).  We will refer you to Dr. Alvan Dame for knee replacement.  If you need a refill on your cardiac medications before your next appointment, please call your pharmacy.

## 2018-03-30 NOTE — Progress Notes (Addendum)
HPI Alyssa Odonnell returns today for ongoing evaluation of a cryptogenic stroke. She had an ILR placed and was found to have PAF and has been started on Eliquis 2.5 mg twice daily. She has not fallen. Her main complaint today is that she has severe right leg pain. She has been turned down for knee replacement in [facility]. She has had pain every day. She does not feel her atrial fib. She denies chest pain or sob. She is limited by her arthritis.  Allergies  Allergen Reactions  . Aspirin Other (See Comments)    Red splotches  . Atorvastatin Other (See Comments)    Muscle spams  . Nabumetone Itching  . Nsaids     Reaction (??)  . Pravastatin Other (See Comments)    Stiff walking difficulty Stiff walking difficulty  . Procaine Swelling  . Rosuvastatin Nausea Only and Other (See Comments)    Reflux and Muscle stiffness  . Simvastatin Nausea Only and Other (See Comments)    Reflux and Muscle stiffness  . Statins Other (See Comments)  . Evolocumab Rash  . Mobic [Meloxicam] Rash  . Penicillins Rash    Has patient had a PCN reaction causing immediate rash, facial/tongue/throat swelling, SOB or lightheadedness with hypotension: Yes Has patient had a PCN reaction causing severe rash involving mucus membranes or skin necrosis: No Has patient had a PCN reaction that required hospitalization: No Has patient had a PCN reaction occurring within the last 10 years: No If all of the above answers are "NO", then may proceed with Cephalosporin use.       Current Outpatient Medications  Medication Sig Dispense Refill  . acetaminophen (TYLENOL) 500 MG tablet Take 1,000 mg by mouth every morning.     Marland Kitchen apixaban (ELIQUIS) 2.5 MG TABS tablet Take 1 tablet (2.5 mg total) by mouth 2 (two) times daily. 90 tablet 3  . bimatoprost (LUMIGAN) 0.03 % ophthalmic solution Administer 1 drop to both eyes every evening.    Marland Kitchen ERGOCALCIFEROL PO Take by mouth.    . furosemide (LASIX) 40 MG tablet Take 40 mg  by mouth daily as needed for edema. (swelling in feet)    . latanoprost (XALATAN) 0.005 % ophthalmic solution Place 1 drop into both eyes at bedtime.    Marland Kitchen levothyroxine (SYNTHROID, LEVOTHROID) 100 MCG tablet Take 100 mcg by mouth daily before breakfast.     . olmesartan-hydrochlorothiazide (BENICAR HCT) 20-12.5 MG tablet Take 1 tablet by mouth daily.    Marland Kitchen omeprazole (PRILOSEC) 20 MG capsule Take 20 mg by mouth daily before breakfast.    . sertraline (ZOLOFT) 100 MG tablet Take 150 mg by mouth every morning.    . triamcinolone cream (KENALOG) 0.1 % Apply 1 application topically daily as needed for itching.     No current facility-administered medications for this visit.      Past Medical History:  Diagnosis Date  . Glaucoma   . Hyperlipidemia   . Hypertension   . Hypothyroid   . Stroke ([place])     ROS:   All systems reviewed and negative except as noted in the HPI.   Past Surgical History:  Procedure Laterality Date  . ABDOMINAL HERNIA REPAIR    . LOOP RECORDER INSERTION N/A 03/11/2017   Procedure: Loop Recorder Insertion;  Surgeon: Evans Lance, MD;  Location: [place] [facility];  Service: Cardiovascular;  Laterality: N/A;  . PERIPHERAL VASCULAR CATHETERIZATION Right 07/06/2016   Procedure: Lower Extremity Angiography;  Surgeon:  Angelia Mould, MD;  Location: [place] [facility];  Service: Cardiovascular;  Laterality: Right;  . PERIPHERAL VASCULAR CATHETERIZATION Right 07/06/2016   Procedure: Peripheral Vascular Intervention;  Surgeon: Angelia Mould, MD;  Location: [place] [facility];  Service: Cardiovascular;  Laterality: Right;  Superficial femoral  . TEE WITHOUT CARDIOVERSION N/A 03/11/2017   Procedure: TRANSESOPHAGEAL ECHOCARDIOGRAM (TEE);  Surgeon: Acie Fredrickson Wonda Cheng, MD;  Location: [facility];  Service: Cardiovascular;  Laterality: N/A;  . TOTAL HIP ARTHROPLASTY       Family History  Problem Relation Age of Onset  . Stroke Father      Social History    Socioeconomic History  . Marital status: Widowed    Spouse name: Not on file  . Number of children: Not on file  . Years of education: Not on file  . Highest education level: Not on file  Occupational History  . Not on file  Social Needs  . Financial resource strain: Not on file  . Food insecurity:    Worry: Not on file    Inability: Not on file  . Transportation needs:    Medical: Not on file    Non-medical: Not on file  Tobacco Use  . Smoking status: Never Smoker  . Smokeless tobacco: Never Used  Substance and Sexual Activity  . Alcohol use: No  . Drug use: No  . Sexual activity: Not on file  Lifestyle  . Physical activity:    Days per week: Not on file    Minutes per session: Not on file  . Stress: Not on file  Relationships  . Social connections:    Talks on phone: Not on file    Gets together: Not on file    Attends religious service: Not on file    Active member of club or organization: Not on file    Attends meetings of clubs or organizations: Not on file    Relationship status: Not on file  . Intimate partner violence:    Fear of current or ex partner: Not on file    Emotionally abused: Not on file    Physically abused: Not on file    Forced sexual activity: Not on file  Other Topics Concern  . Not on file  Social History Narrative  . Not on file     BP 126/84   Pulse 86   Ht 5\' 3"  (1.6 m)   Wt 184 lb (83.5 kg)   BMI 32.59 kg/m   Physical Exam:  Well appearing 84 yo woman, NAD HEENT: Unremarkable Neck:  6 cm JVD, no thyromegally Lymphatics:  No adenopathy Back:  No CVA tenderness Lungs:  Clear with no wheezes HEART:  Regular rate rhythm, no murmurs, no rubs, no clicks Abd:  soft, positive bowel sounds, no organomegally, no rebound, no guarding Ext:  2 plus pulses, no edema, no cyanosis, no clubbing Skin:  No rashes no nodules Neuro:  CN II through XII intact, motor grossly intact  EKG - NSR  DEVICE  Normal device function.  See  PaceArt for details. Atrial fib with a RVR  Assess/Plan: 1. PAF - she is asymptomatic and will continue Eliquis. She has been out of rhythm less than 1%. 2. Stroke - she has tolerated her Eliquis with no bleeding. She has some minimal residual vision problems. 3. HTN - her blood pressure is well controlled. We will follow. 4. Preop eval - it is unclear whether she could be benefitted by surgery but she is  an acceptable surgical candidate if knee replacement surgery is being considered.  Mikle Bosworth.D.

## 2018-04-11 LAB — CUP PACEART REMOTE DEVICE CHECK
Date Time Interrogation Session: 20190529221051
Implantable Pulse Generator Implant Date: 20180524

## 2018-04-18 ENCOUNTER — Ambulatory Visit (INDEPENDENT_AMBULATORY_CARE_PROVIDER_SITE_OTHER): Payer: Medicare Other | Admitting: *Deleted

## 2018-04-18 DIAGNOSIS — I639 Cerebral infarction, unspecified: Secondary | ICD-10-CM

## 2018-04-19 NOTE — Progress Notes (Signed)
Carelink Summary Report / Loop Recorder 

## 2018-04-25 LAB — CUP PACEART INCLINIC DEVICE CHECK
Date Time Interrogation Session: 20190612181325
Implantable Pulse Generator Implant Date: 20180524

## 2018-05-17 LAB — CUP PACEART REMOTE DEVICE CHECK
Date Time Interrogation Session: 20190701224000
Implantable Pulse Generator Implant Date: 20180524

## 2018-05-23 ENCOUNTER — Ambulatory Visit (INDEPENDENT_AMBULATORY_CARE_PROVIDER_SITE_OTHER): Payer: Medicare Other | Admitting: *Deleted

## 2018-05-23 DIAGNOSIS — I639 Cerebral infarction, unspecified: Secondary | ICD-10-CM

## 2018-05-24 NOTE — Progress Notes (Signed)
Carelink Summary Report / Loop Recorder 

## 2018-06-07 ENCOUNTER — Other Ambulatory Visit: Payer: Self-pay | Admitting: Internal Medicine

## 2018-06-07 IMAGING — CT CT ANGIO HEAD
1 of 9 series · 5 of 33 positions shown · IV contrast (isovue)
Comparison: Head CT from earlier today

CLINICAL DATA: Left PCA infarct. Visual changes and left arm
weakness.

EXAM:
CT ANGIOGRAPHY HEAD AND NECK
TECHNIQUE: Multidetector CT imaging of the head and neck was performed using
the standard protocol during bolus administration of intravenous
contrast. Multiplanar CT image reconstructions and MIPs were
obtained to evaluate the vascular anatomy. Carotid stenosis
measurements (when applicable) are obtained utilizing NASCET
criteria, using the distal internal carotid diameter as the
denominator.
CONTRAST:  50 cc Isovue 370 intravenous

[Series 7: cta neck axial · axial · 0.39mm/px · z∈[-302,-94]mm · 5 of 312 slices shown]
[im 52/312  soft-tissue]
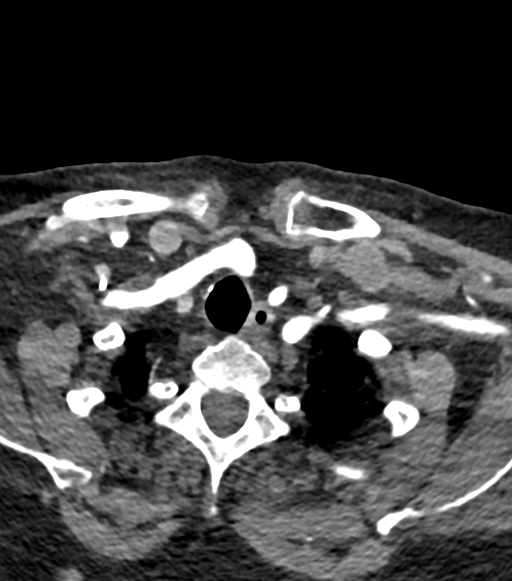
[im 104/312  bone]
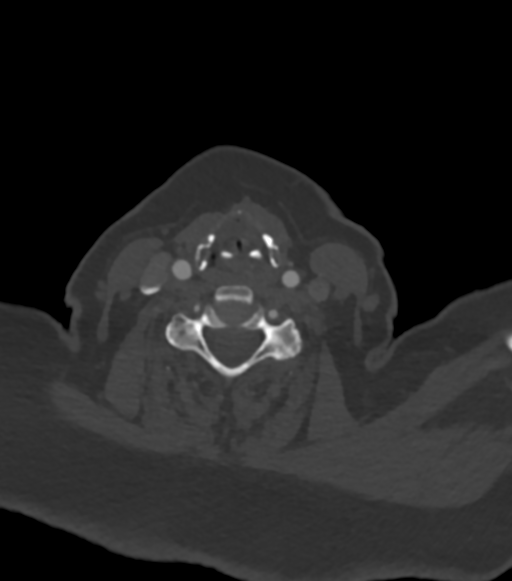
[im 156/312  soft-tissue]
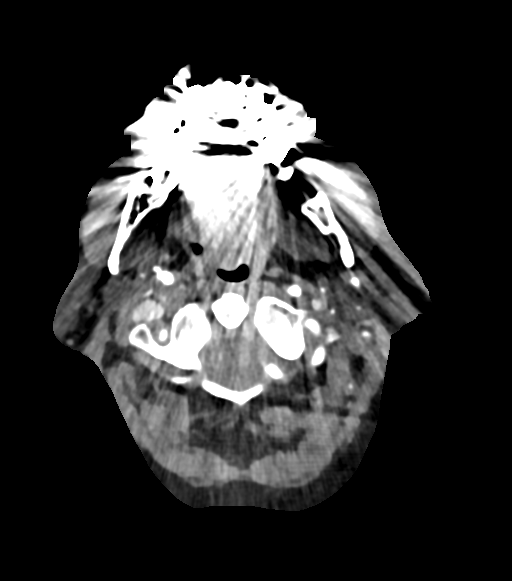
[im 208/312  bone]
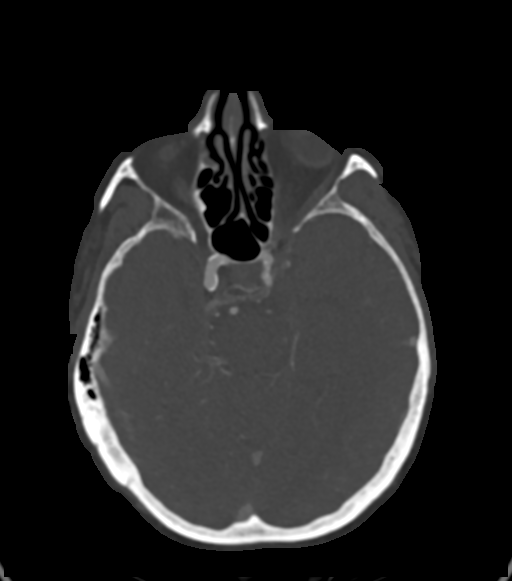
[im 260/312  soft-tissue]
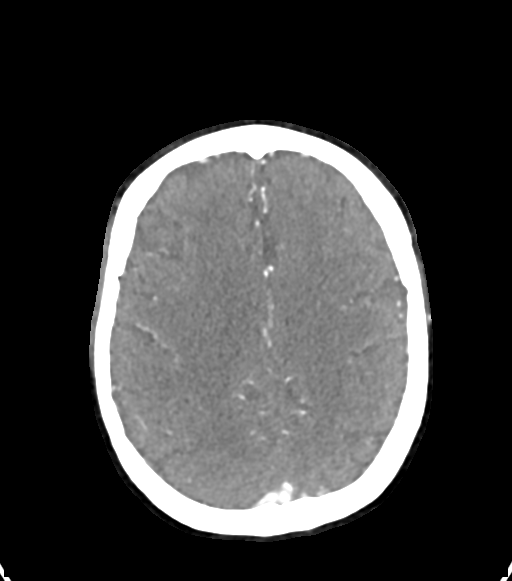

[5 of 33 positions shown; findings below may reference images not displayed]

FINDINGS: CTA NECK FINDINGS

Aortic arch: Atherosclerosis that is mild.  Three vessel branching.

Right carotid system: Moderate calcified plaque at the common
carotid bifurcation and ICA bulb without stenosis, ulceration, or
dissection.

Left carotid system: Moderate predominately noncalcified plaque at
the common carotid bifurcation and ICA bulb without flow limiting
stenosis, ulceration, or dissection.

Vertebral arteries: No brachiocephalic or proximal subclavian flow
limiting stenosis. Left dominant vertebral artery. Both vessels are
smooth and widely patent to the dura.

Skeleton: Degenerative changes in the cervical spine. No acute or
aggressive finding.

Other neck: 9 mm left thyroid mass or cyst, considered incidental
based on size and demographics.

Upper chest: Right base density on scout image is from intrathoracic
stomach.

Review of the MIP images confirms the above findings

CTA HEAD FINDINGS

Anterior circulation: Confluent atherosclerotic plaque on the
carotid siphons. No branch occlusion or flow limiting stenosis.
Negative for aneurysm.

Posterior circulation: Mild left vertebral artery dominance.
Moderate atheromatous narrowing of the left vertebral artery due to
noncalcified plaque just past the dura. The basilar is smooth and
widely patent. Dysplastic distal basilar without true saccular
aneurysm. Proximal left P2 segment occlusion with intermittent faint
downstream reconstitution. Fetal type PCA on the right.

Venous sinuses: Patent.

Anatomic variants: None other than described above.

Delayed phase: No abnormal intracranial enhancement. The left PCA
infarct is nonenhancing.

Review of the MIP images confirms the above findings
IMPRESSION: 1. Left proximal P2 occlusion with weak downstream reconstitution.
2. Moderate atheromatous narrowing at the left V4 segment.
3. Atherosclerosis without flow limiting stenosis in the anterior
circulation.

## 2018-06-07 IMAGING — MR MR HEAD W/O CM
8 of 10 series · 35 of 48 positions shown · non-contrast
Comparison: CT HEAD March 09, 2017 at 4131 hours

CLINICAL DATA: RIGHT-sided weakness, visual disturbances for month,
increasing in frequency. Generalized weakness. History of
hypertension, hyperlipidemia. Follow-up stroke.

EXAM:
MRI HEAD WITHOUT CONTRAST
TECHNIQUE: Multiplanar, multiecho pulse sequences of the brain and surrounding
structures were obtained without intravenous contrast.

[Series 3: DWI · axial · 3.0mm · 1.09mm/px · z∈[-72,+66]mm · 9 of 94 slices shown (1 of 4)]
[im 1/94]
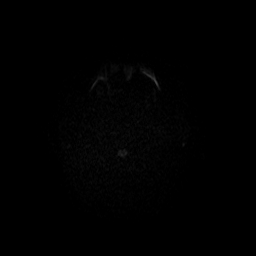
[im 12/94]
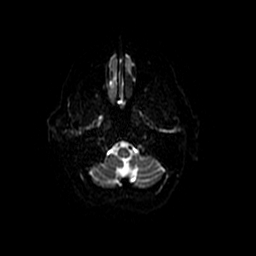
[im 24/94]
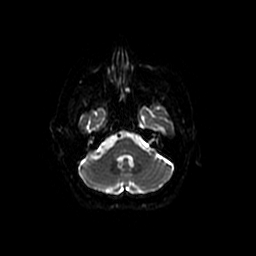
[im 35/94]
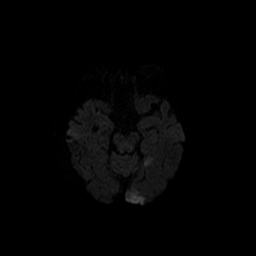
[im 47/94]
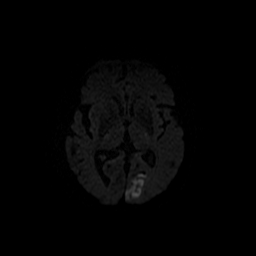
[im 59/94]
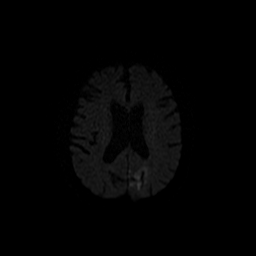
[im 70/94]
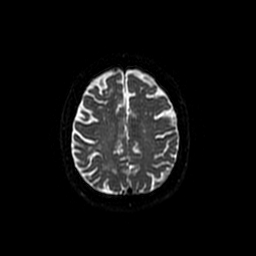
[im 82/94]
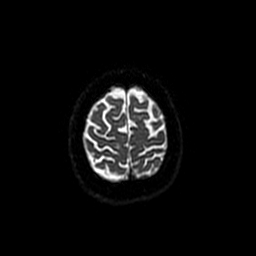
[im 94/94]
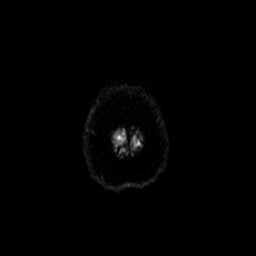

[Series 4: DWI · coronal · 5.0mm · 1.09mm/px · 7 of 66 slices shown (2 of 4)]
[im 1/66]
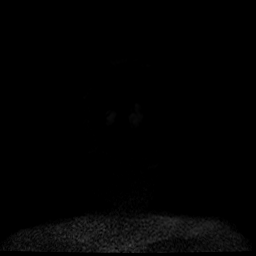
[im 11/66]
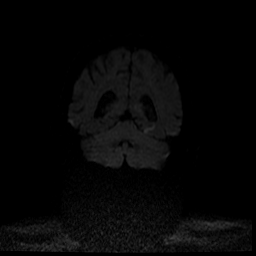
[im 22/66]
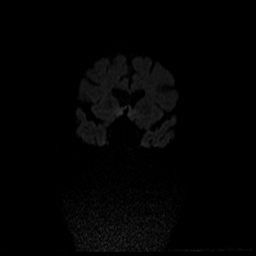
[im 33/66]
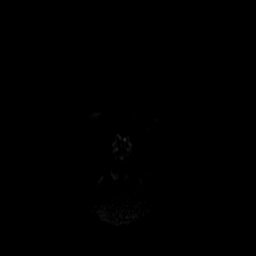
[im 44/66]
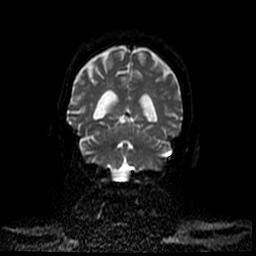
[im 55/66]
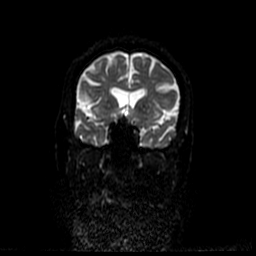
[im 66/66]
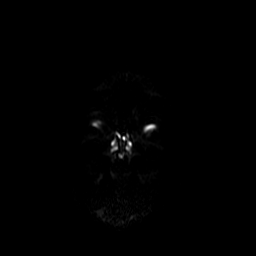

[Series 5: T1 · sagittal · 5.0mm · 0.47mm/px · 2 of 23 slices shown]
[im 1/23]
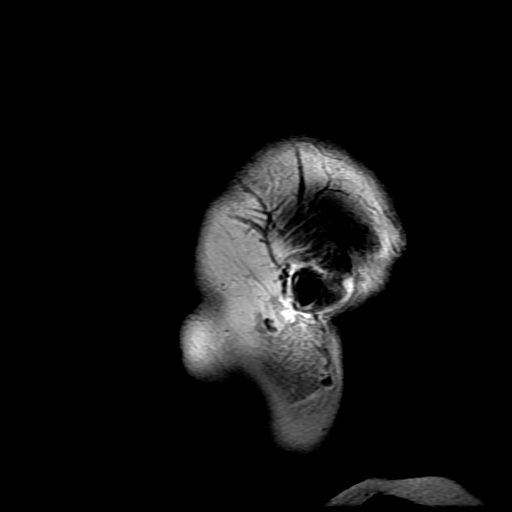
[im 23/23]
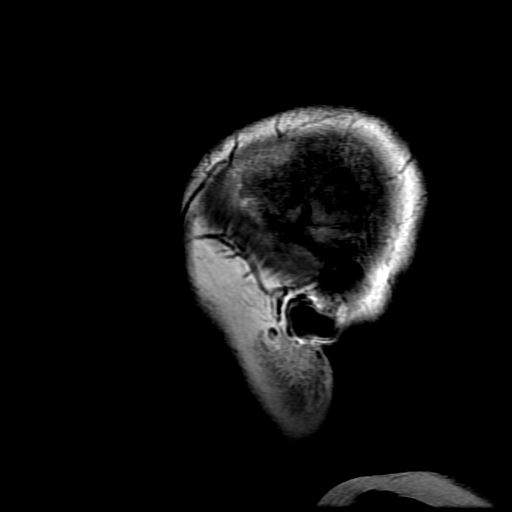

[Series 6: T2 · axial · 5.0mm · 0.43mm/px · z∈[-74,+64]mm · 3 of 24 slices shown (1 of 2)]
[im 1/24]
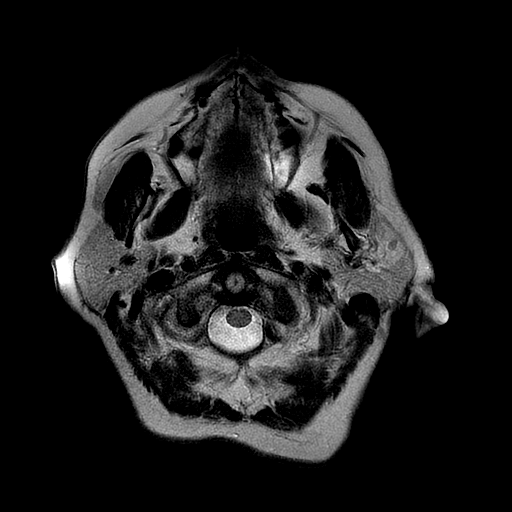
[im 12/24]
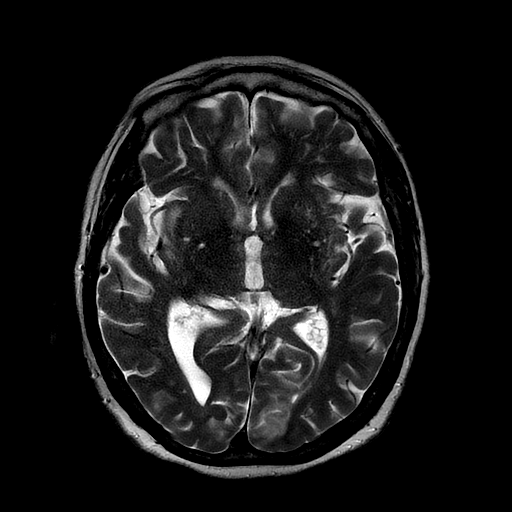
[im 24/24]
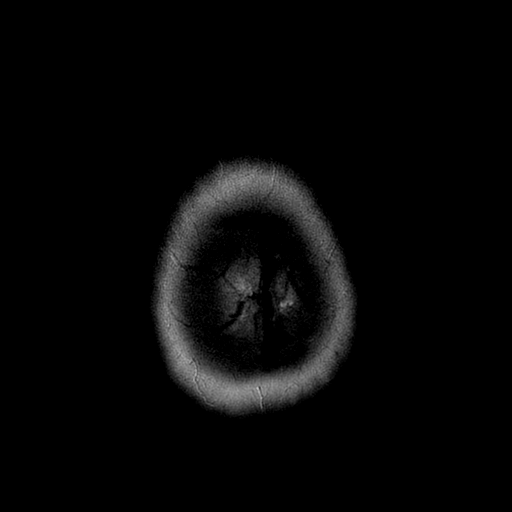

[Series 7: FLAIR · axial · 5.0mm · 0.43mm/px · z∈[-74,+64]mm · 3 of 24 slices shown]
[im 1/24]
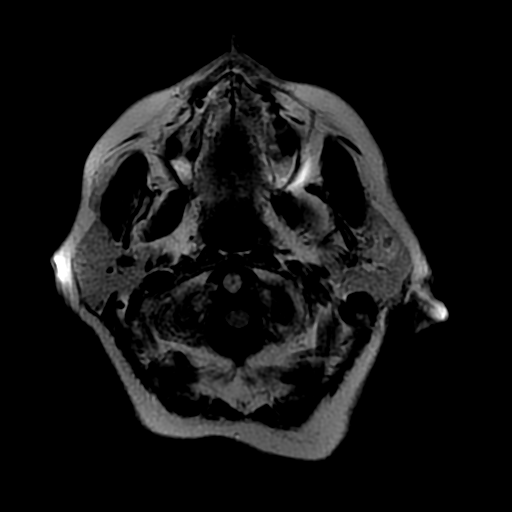
[im 12/24]
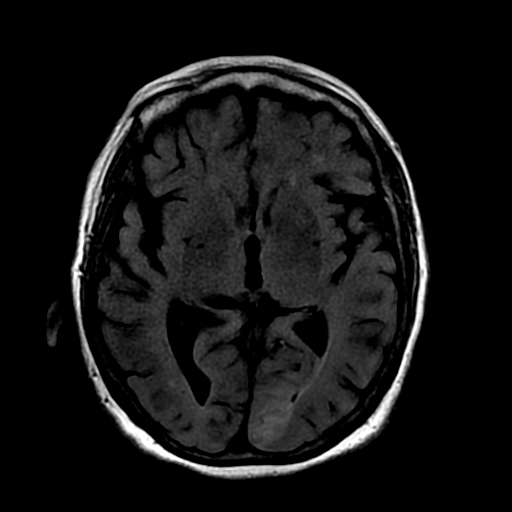
[im 24/24]
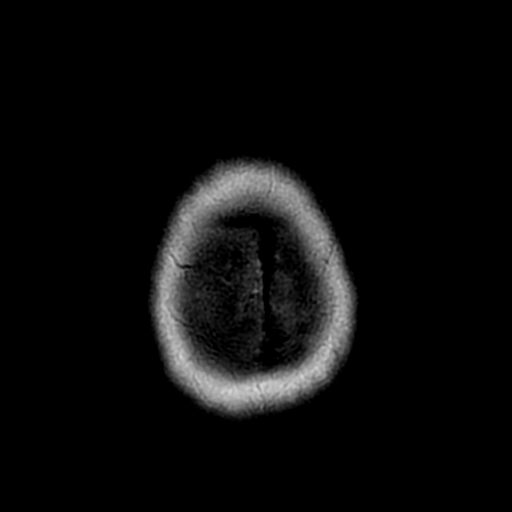

[Series 10: T2 · coronal · 5.0mm · 0.43mm/px · 3 of 28 slices shown (2 of 2)]
[im 1/28]
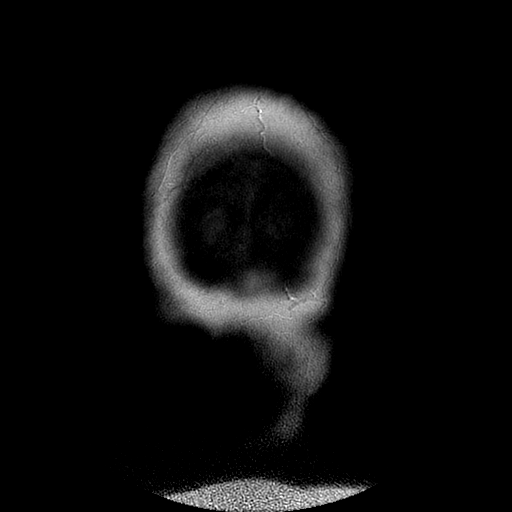
[im 14/28]
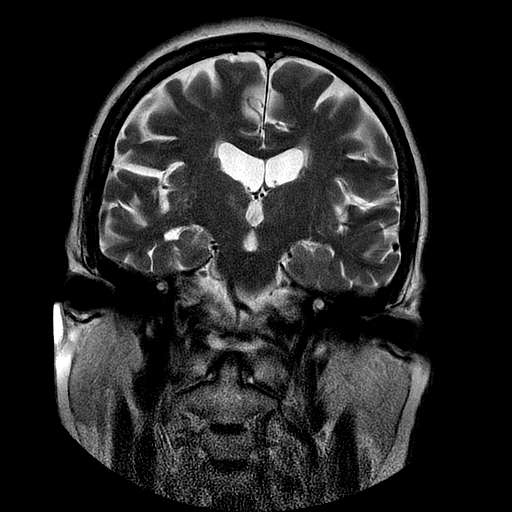
[im 28/28]
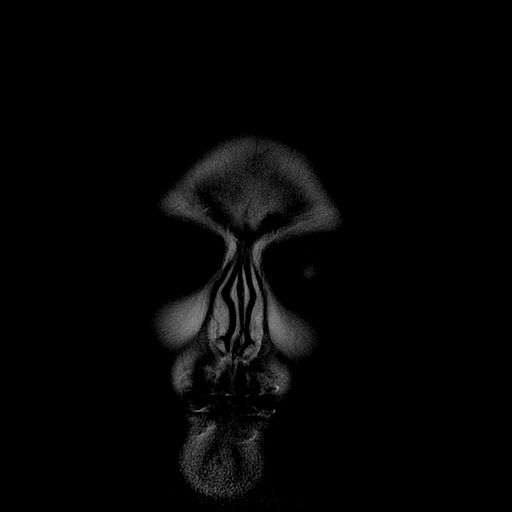

[Series 300: DWI · axial · 3.0mm · 1.09mm/px · z∈[-72,+66]mm · 5 of 47 slices shown (3 of 4)]
[im 1/47]
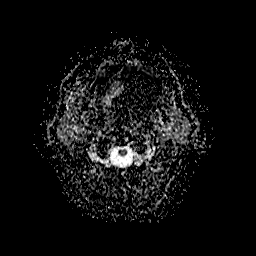
[im 12/47]
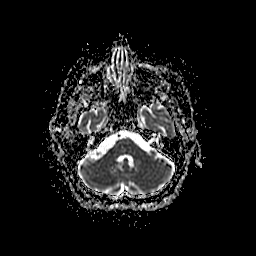
[im 24/47]
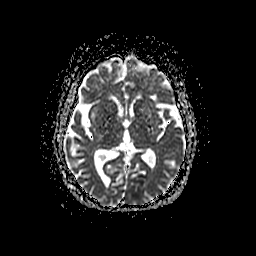
[im 35/47]
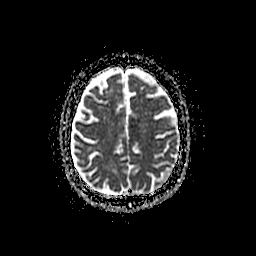
[im 47/47]
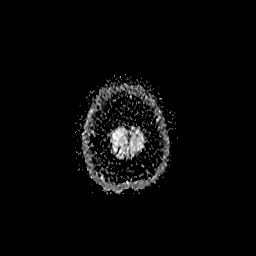

[Series 400: DWI · coronal · 5.0mm · 1.09mm/px · 3 of 33 slices shown (4 of 4)]
[im 1/33]
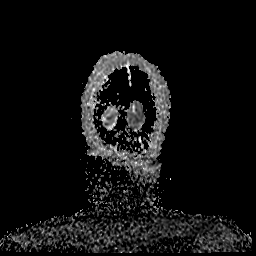
[im 17/33]
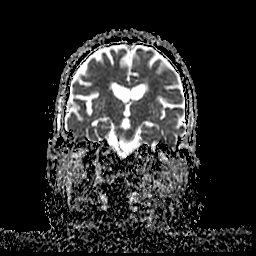
[im 33/33]
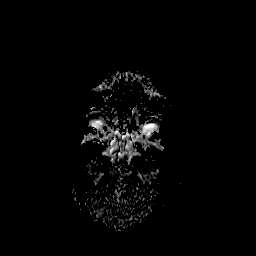

[35 of 48 positions shown; findings below may reference images not displayed]

FINDINGS: BRAIN: Faint reduced diffusion LEFT mesial temporal lobe, and patchy
reduced diffusion LEFT occipital lobe with slightly decreased ADC
values, near normalization. Faint mineralization RIGHT angle basal
ganglia associated with old infarct. No susceptibility artifact to
suggest hemorrhage. Tiny probable perivascular spaces LEFT basal
ganglia. Ventricles and sulci are normal for patient's age. Patchy
to confluent supratentorial white matter FLAIR T2 hyperintensities.
No midline shift, mass effect or masses. No abnormal extra-axial
fluid collections.

VASCULAR: Normal major intracranial vascular flow voids present at
skull base.

SKULL AND UPPER CERVICAL SPINE: No abnormal sellar expansion. No
suspicious calvarial bone marrow signal. Craniocervical junction
maintained.

SINUSES/ORBITS: The mastoid air-cells and included paranasal sinuses
are well-aerated. The included ocular globes and orbital contents
are non-suspicious.

OTHER: None.
IMPRESSION: Acute to subacute nonhemorrhagic LEFT PCA territory infarct.

Moderate chronic small vessel ischemic disease and old RIGHT basal
ganglia infarct.

## 2018-06-07 IMAGING — CT CT HEAD CODE STROKE
2 of 3 series · 14 of 47 positions shown, 17 images · non-contrast
Comparison: None.

CLINICAL DATA: Code stroke.  Right-sided weakness

EXAM:
CT HEAD WITHOUT CONTRAST
TECHNIQUE: Contiguous axial images were obtained from the base of the skull
through the vertex without intravenous contrast.

[Series 3: head 5.0 st · axial · 0.43mm/px · z∈[-124,+1]mm · 11 of 31 slices shown, 14 images]
[im 3/31  brain]
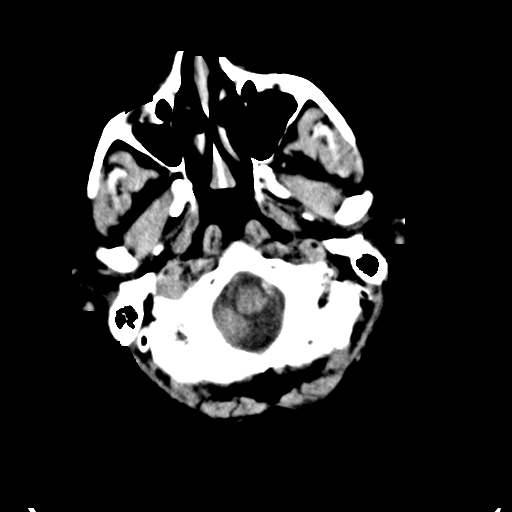
[im 3/31  bone]
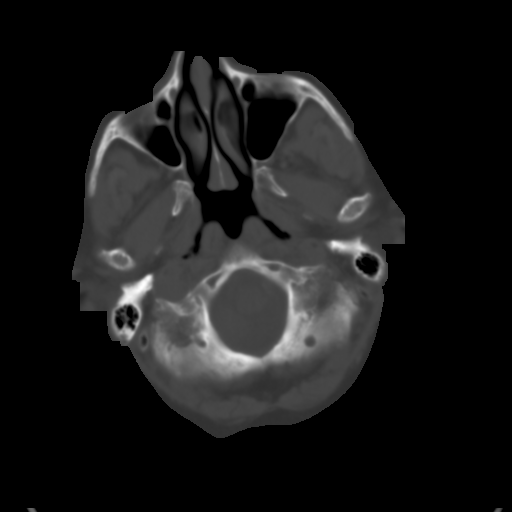
[im 5/31  brain]
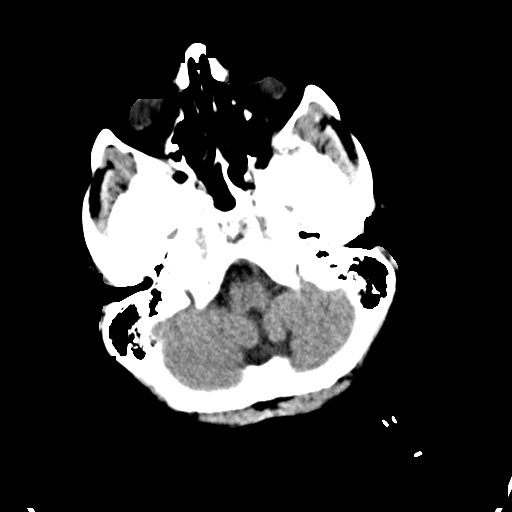
[im 8/31  brain]
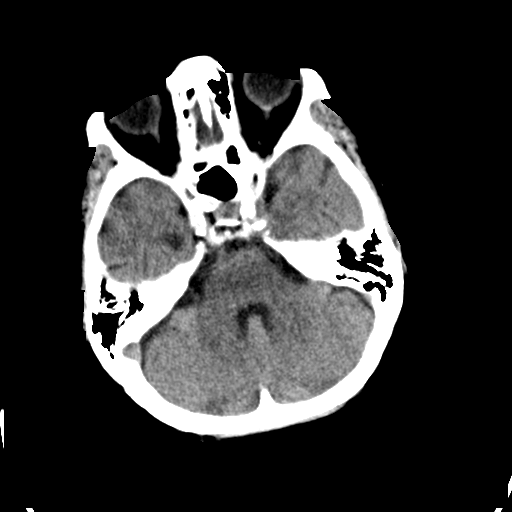
[im 10/31  brain]
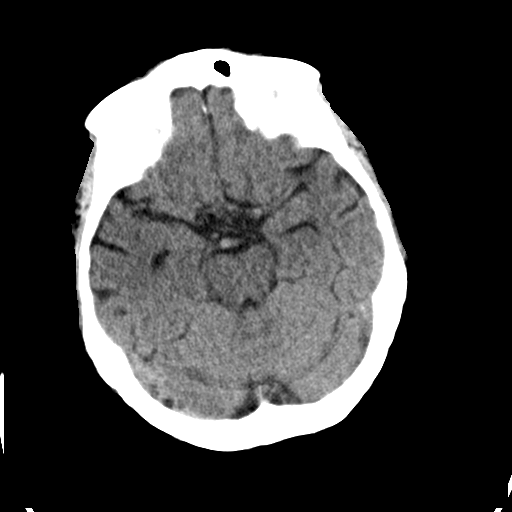
[im 13/31  brain]
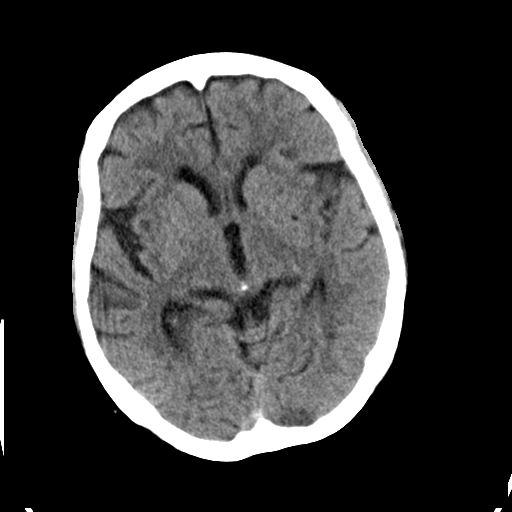
[im 13/31  bone]
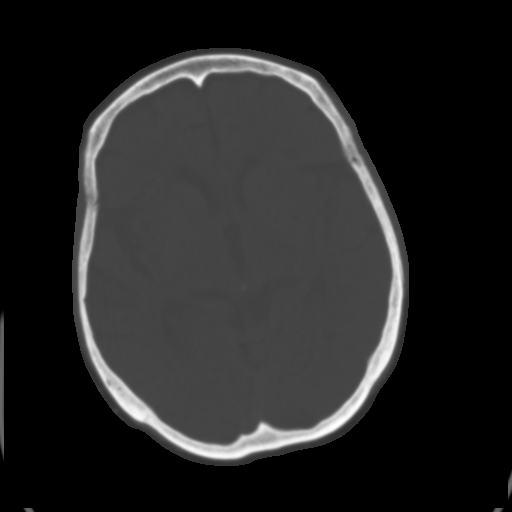
[im 16/31  brain]
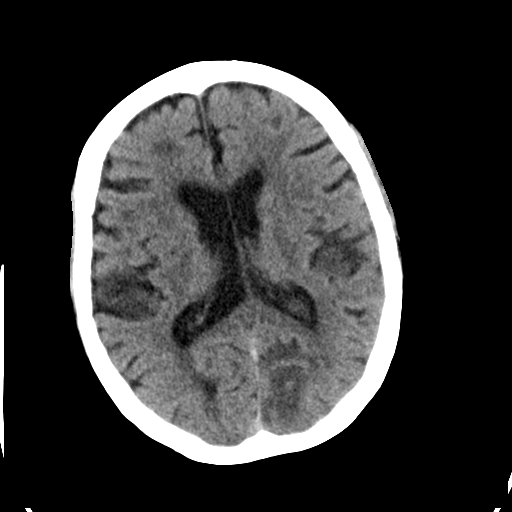
[im 18/31  brain]
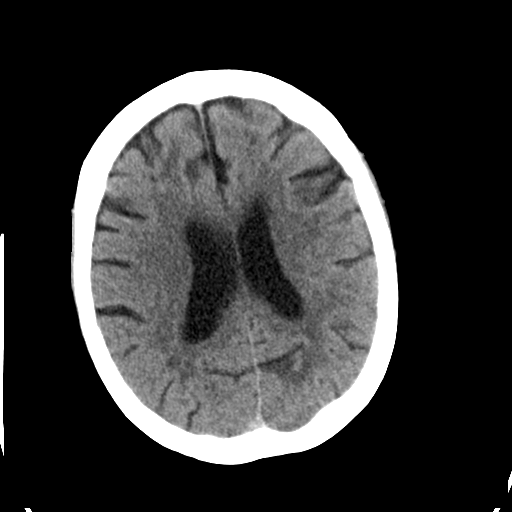
[im 21/31  brain]
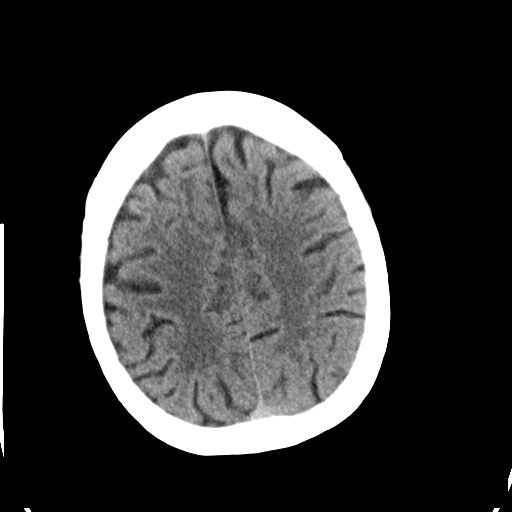
[im 23/31  brain]
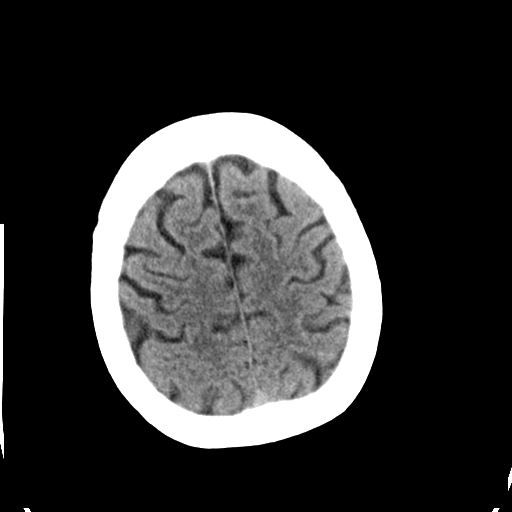
[im 23/31  bone]
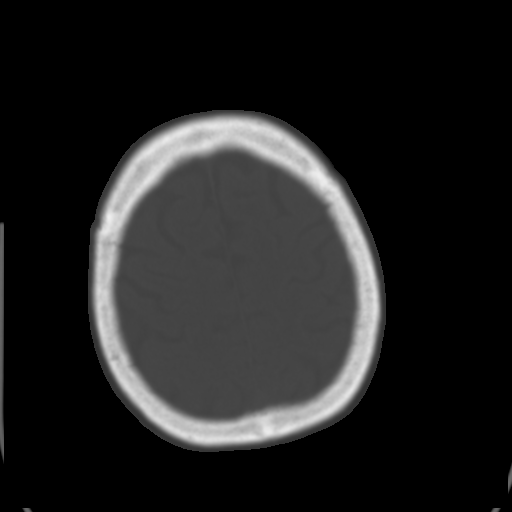
[im 26/31  brain]
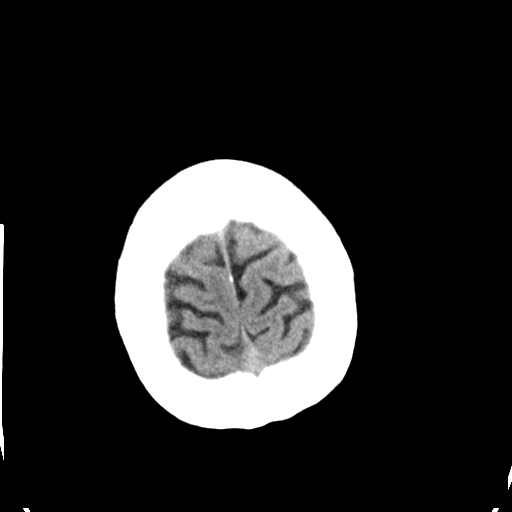
[im 28/31  brain]
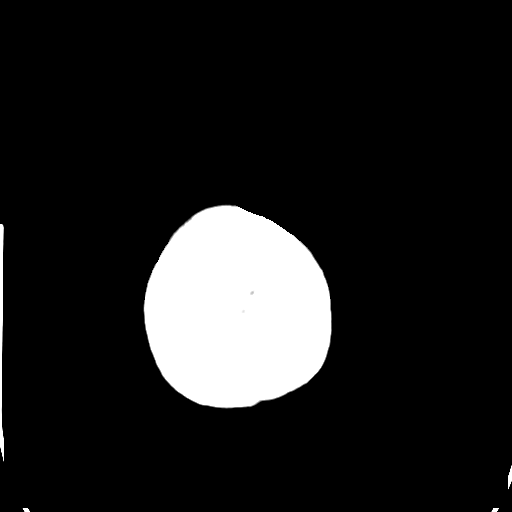

[Series 5: head 3.0 cor st · coronal · 0.30mm/px · 3 of 67 slices shown]
[im 23/67  brain]
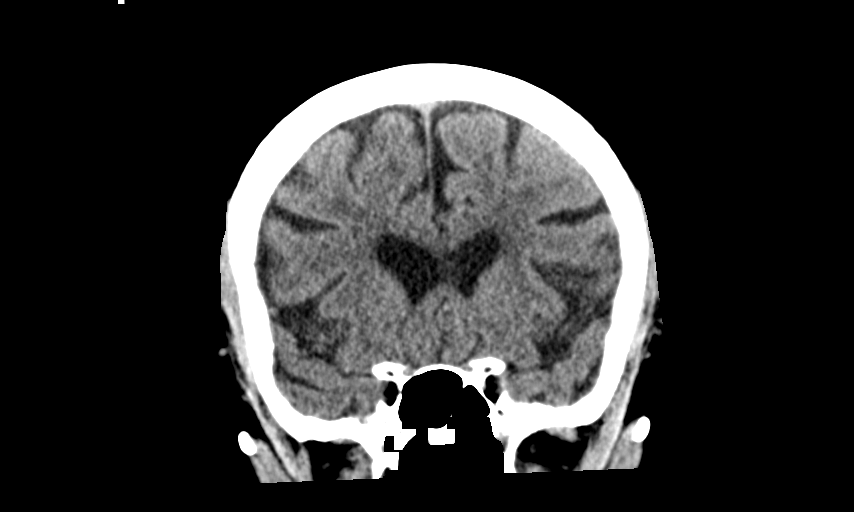
[im 30/67  brain]
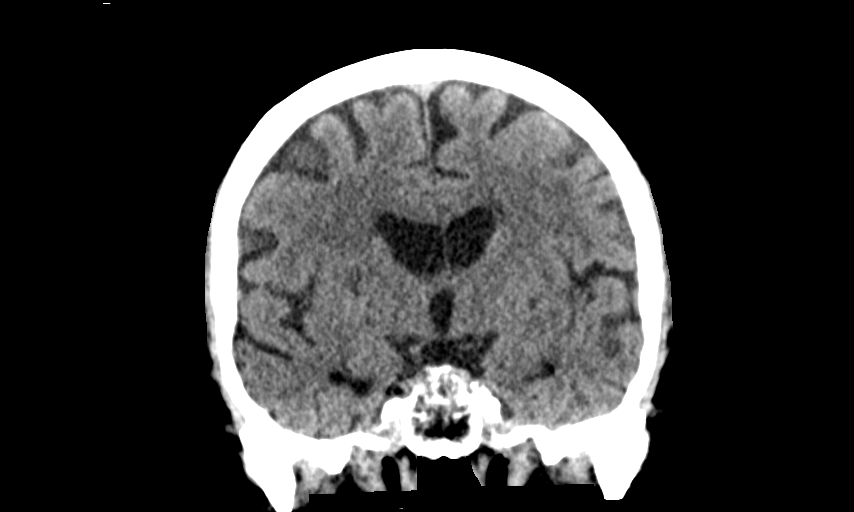
[im 37/67  brain]
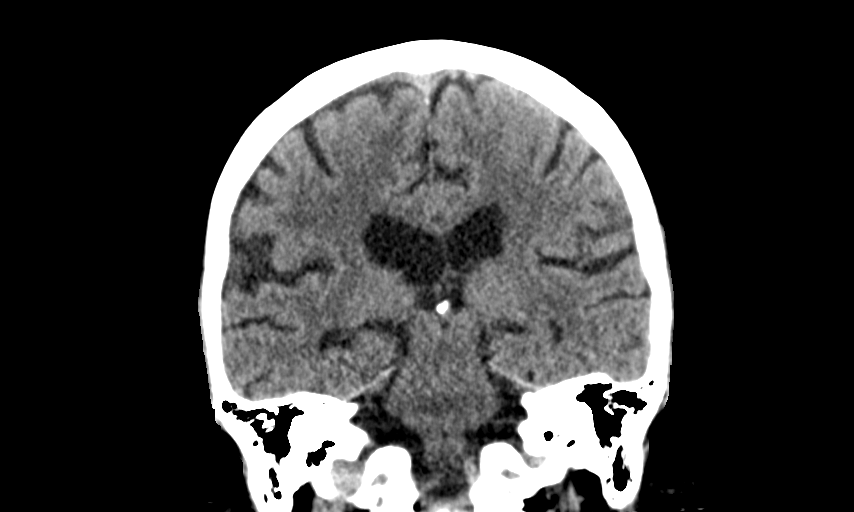

[14 of 47 positions shown; findings below may reference images not displayed]

FINDINGS: Brain: Moderate area of cytotoxic type edema in the parasagittal
left occipital lobe, likely subacute. Patient has had visual
disturbance for a few days. There is patchy chronic microvascular
ischemic change in the cerebral white matter with remote lacunar
infarct in the right upper putamen. No cortical infarct seen in the
ACA or MCA distributions. No hemorrhage, hydrocephalus, or mass.

Vascular: Atherosclerotic calcification.  No hyperdense vessel.

Skull: No acute or aggressive finding

Sinuses/Orbits: Negative

Other: These results were called by telephone at the time of
interpretation on 03/09/2017 at [DATE] to Dr. Infolik, who verbally
acknowledged these results.

ASPECTS ([place] Stroke Program Early CT Score)

Not scored with the above findings.
IMPRESSION: 1. Moderate infarct in the left occipital lobe correlating with
history of subacute visual symptoms. No hemorrhagic conversion.
2. No visible MCA or ACA cortical infarct in this patient with
weakness.
3. Moderate chronic small vessel ischemia.

## 2018-06-23 ENCOUNTER — Ambulatory Visit (INDEPENDENT_AMBULATORY_CARE_PROVIDER_SITE_OTHER): Payer: Medicare Other | Admitting: *Deleted

## 2018-06-23 DIAGNOSIS — I639 Cerebral infarction, unspecified: Secondary | ICD-10-CM

## 2018-06-24 NOTE — Progress Notes (Signed)
Carelink Summary Report / Loop Recorder 

## 2018-07-04 LAB — CUP PACEART REMOTE DEVICE CHECK
Date Time Interrogation Session: 20190803233922
Implantable Pulse Generator Implant Date: 20180524

## 2018-07-15 LAB — CUP PACEART REMOTE DEVICE CHECK
Date Time Interrogation Session: 20190905233648
Implantable Pulse Generator Implant Date: 20180524

## 2018-07-26 ENCOUNTER — Ambulatory Visit (INDEPENDENT_AMBULATORY_CARE_PROVIDER_SITE_OTHER): Payer: Medicare Other | Admitting: *Deleted

## 2018-07-26 DIAGNOSIS — I639 Cerebral infarction, unspecified: Secondary | ICD-10-CM | POA: Diagnosis not present

## 2018-07-27 NOTE — Progress Notes (Signed)
Carelink Summary Report / Loop Recorder 

## 2018-08-08 LAB — CUP PACEART REMOTE DEVICE CHECK
Date Time Interrogation Session: 20191009001006
Implantable Pulse Generator Implant Date: 20180524

## 2018-08-29 ENCOUNTER — Ambulatory Visit (INDEPENDENT_AMBULATORY_CARE_PROVIDER_SITE_OTHER): Payer: Medicare Other | Admitting: *Deleted

## 2018-08-29 DIAGNOSIS — I639 Cerebral infarction, unspecified: Secondary | ICD-10-CM

## 2018-08-29 NOTE — Progress Notes (Signed)
Carelink Summary Report / Loop Recorder 

## 2018-09-30 ENCOUNTER — Ambulatory Visit (INDEPENDENT_AMBULATORY_CARE_PROVIDER_SITE_OTHER): Payer: Medicare Other

## 2018-09-30 DIAGNOSIS — I639 Cerebral infarction, unspecified: Secondary | ICD-10-CM

## 2018-10-03 NOTE — Progress Notes (Signed)
Carelink Summary Report / Loop Recorder 

## 2018-10-15 LAB — CUP PACEART REMOTE DEVICE CHECK
Date Time Interrogation Session: 20191111011154
Implantable Pulse Generator Implant Date: 20180524

## 2018-11-02 ENCOUNTER — Ambulatory Visit (INDEPENDENT_AMBULATORY_CARE_PROVIDER_SITE_OTHER): Payer: Medicare Other

## 2018-11-02 DIAGNOSIS — I639 Cerebral infarction, unspecified: Secondary | ICD-10-CM | POA: Diagnosis not present

## 2018-11-03 NOTE — Progress Notes (Signed)
Carelink Summary Report / Loop Recorder 

## 2018-11-04 LAB — CUP PACEART REMOTE DEVICE CHECK
Date Time Interrogation Session: 20200116013716
Implantable Pulse Generator Implant Date: 20180524

## 2018-11-06 LAB — CUP PACEART REMOTE DEVICE CHECK
Date Time Interrogation Session: 20191214013631
Implantable Pulse Generator Implant Date: 20180524

## 2018-12-05 ENCOUNTER — Ambulatory Visit (INDEPENDENT_AMBULATORY_CARE_PROVIDER_SITE_OTHER): Payer: Medicare Other

## 2018-12-05 DIAGNOSIS — I639 Cerebral infarction, unspecified: Secondary | ICD-10-CM | POA: Diagnosis not present

## 2018-12-06 LAB — CUP PACEART REMOTE DEVICE CHECK
Date Time Interrogation Session: 20200218023556
Implantable Pulse Generator Implant Date: 20180524

## 2018-12-14 NOTE — Progress Notes (Signed)
Carelink Summary Report / Loop Recorder 

## 2019-01-09 ENCOUNTER — Ambulatory Visit (INDEPENDENT_AMBULATORY_CARE_PROVIDER_SITE_OTHER): Payer: Medicare Other | Admitting: *Deleted

## 2019-01-09 ENCOUNTER — Other Ambulatory Visit: Payer: Self-pay

## 2019-01-09 DIAGNOSIS — I639 Cerebral infarction, unspecified: Secondary | ICD-10-CM | POA: Diagnosis not present

## 2019-01-09 LAB — CUP PACEART REMOTE DEVICE CHECK
Date Time Interrogation Session: 20200322084036
Implantable Pulse Generator Implant Date: 20180524

## 2019-01-17 NOTE — Progress Notes (Signed)
Carelink Summary Report / Loop Recorder 

## 2019-02-10 ENCOUNTER — Other Ambulatory Visit: Payer: Self-pay

## 2019-02-10 ENCOUNTER — Ambulatory Visit (INDEPENDENT_AMBULATORY_CARE_PROVIDER_SITE_OTHER): Payer: Medicare Other | Admitting: *Deleted

## 2019-02-10 DIAGNOSIS — I639 Cerebral infarction, unspecified: Secondary | ICD-10-CM | POA: Diagnosis not present

## 2019-02-10 LAB — CUP PACEART REMOTE DEVICE CHECK
Date Time Interrogation Session: 20200424093845
Implantable Pulse Generator Implant Date: 20180524

## 2019-02-17 NOTE — Progress Notes (Signed)
Carelink Summary Report / Loop Recorder 

## 2019-03-13 ENCOUNTER — Other Ambulatory Visit: Payer: Self-pay | Admitting: Internal Medicine

## 2019-03-13 DIAGNOSIS — I480 Paroxysmal atrial fibrillation: Secondary | ICD-10-CM

## 2019-03-15 ENCOUNTER — Ambulatory Visit (INDEPENDENT_AMBULATORY_CARE_PROVIDER_SITE_OTHER): Payer: Medicare Other | Admitting: *Deleted

## 2019-03-15 DIAGNOSIS — I639 Cerebral infarction, unspecified: Secondary | ICD-10-CM

## 2019-03-15 LAB — CUP PACEART REMOTE DEVICE CHECK
Date Time Interrogation Session: 20200527085136
Implantable Pulse Generator Implant Date: 20180524

## 2019-03-27 NOTE — Progress Notes (Signed)
Carelink Summary Report / Loop Recorder 

## 2019-04-17 ENCOUNTER — Ambulatory Visit (INDEPENDENT_AMBULATORY_CARE_PROVIDER_SITE_OTHER): Payer: Medicare Other | Admitting: *Deleted

## 2019-04-17 DIAGNOSIS — I63432 Cerebral infarction due to embolism of left posterior cerebral artery: Secondary | ICD-10-CM

## 2019-04-17 DIAGNOSIS — I480 Paroxysmal atrial fibrillation: Secondary | ICD-10-CM

## 2019-04-17 LAB — CUP PACEART REMOTE DEVICE CHECK
Date Time Interrogation Session: 20200629121024
Implantable Pulse Generator Implant Date: 20180524

## 2019-04-24 ENCOUNTER — Telehealth: Payer: Self-pay | Admitting: *Deleted

## 2019-04-24 NOTE — Telephone Encounter (Signed)
-----   Message from Ace Gins sent at 04/13/2019  4:55 PM EDT ----- Regarding: Issue with a bill This patient has had a heart machine for a couple years now but has now started receiving a $29 copay bill.  Patient does not wish to pay this bill and no longer wants to use the machine.  States she spoke with billing department and they referred her to our office manager.  Please advise.Best number to contact is [phone]

## 2019-04-24 NOTE — Telephone Encounter (Signed)
Returned patient's call at the number listed and the numbers in the chart, message stating caller is unable to received calls at this time.   We keep trying to reach patient.

## 2019-04-26 NOTE — Progress Notes (Signed)
Carelink Summary Report / Loop Recorder 

## 2019-05-15 ENCOUNTER — Telehealth: Payer: Self-pay

## 2019-05-15 NOTE — Telephone Encounter (Signed)
Unable to leave voicemail. Calling in regards to [place] alerts to assess if pt was symptomatic and to request manual transmission.

## 2019-05-17 NOTE — Telephone Encounter (Signed)
Spoke with patient. She spoke with tech services and she will need to call them back later this evening due to the reader battery needing a charge. Advised we will be on the lookout for her transmission tomorrow. Pt verbalizes understanding and denies questions at this time.

## 2019-05-17 NOTE — Telephone Encounter (Signed)
Spoke with patient. No change in condition . No c/o CP. SHOB or syncope. Attempted to send manual transmission but received error code on monitor of [id number]. Patienrt provided with [facility] tech support contact # to receive assistance with trouble shooting monitor. Will contact device clinic when transmission sent.

## 2019-05-19 NOTE — Telephone Encounter (Signed)
No answer no voice mail  

## 2019-05-20 LAB — CUP PACEART REMOTE DEVICE CHECK
Date Time Interrogation Session: 20200801120649
Implantable Pulse Generator Implant Date: 20180524

## 2019-05-22 ENCOUNTER — Ambulatory Visit (INDEPENDENT_AMBULATORY_CARE_PROVIDER_SITE_OTHER): Payer: Medicare Other | Admitting: *Deleted

## 2019-05-22 DIAGNOSIS — I63432 Cerebral infarction due to embolism of left posterior cerebral artery: Secondary | ICD-10-CM

## 2019-05-22 NOTE — Telephone Encounter (Signed)
Spoke w/ pt and instructed her to send a manual transmission w/ her home monitor. She stated that she is waiting on a new monitor. She stated that she don't want the home monitor. She stated that she has had 2 calls in 2 years. I informed her that if she sends the monitor back we would not be able to detect if she is having an arrhythmia or not that could cause another stroke. Pt stated that she can tell when she is in Afib and that she no longer wants to be monitored and is looking into transferring her care to [place].

## 2019-05-24 NOTE — Telephone Encounter (Signed)
Per Dr. Lovena Le, as patient has had documented PAF and is anticoagulated with Eliquis, may discontinue remote monitoring.   Attempted to reach patient to advise we will unenroll from [facility] and order return kit for monitor. Busy signal at home number.

## 2019-05-25 NOTE — Telephone Encounter (Signed)
Spoke w/ pt and informed her of MD recommendations. Return kit ordered and un enrolled from [facility]. Updated status in paceart.

## 2019-05-31 NOTE — Progress Notes (Signed)
Carelink Summary Report / Loop Recorder 

## 2019-08-20 DIAGNOSIS — K311 Adult hypertrophic pyloric stenosis: Secondary | ICD-10-CM

## 2019-08-20 HISTORY — DX: Adult hypertrophic pyloric stenosis: K31.1

## 2019-08-28 ENCOUNTER — Encounter (HOSPITAL_COMMUNITY): Payer: Self-pay | Admitting: Emergency Medicine

## 2019-08-28 ENCOUNTER — Other Ambulatory Visit: Payer: Self-pay

## 2019-08-28 ENCOUNTER — Inpatient Hospital Stay (HOSPITAL_COMMUNITY)
Admission: EM | Admit: 2019-08-28 | Discharge: 2019-09-26 | DRG: 326 | Disposition: A | Discharge status: Skilled Nursing Facility | Payer: Medicare Other | Attending: Internal Medicine | Admitting: Internal Medicine

## 2019-08-28 DIAGNOSIS — I4819 Other persistent atrial fibrillation: Secondary | ICD-10-CM | POA: Diagnosis present

## 2019-08-28 DIAGNOSIS — Y838 Other surgical procedures as the cause of abnormal reaction of the patient, or of later complication, without mention of misadventure at the time of the procedure: Secondary | ICD-10-CM | POA: Diagnosis not present

## 2019-08-28 DIAGNOSIS — G8929 Other chronic pain: Secondary | ICD-10-CM | POA: Diagnosis present

## 2019-08-28 DIAGNOSIS — E669 Obesity, unspecified: Secondary | ICD-10-CM | POA: Diagnosis present

## 2019-08-28 DIAGNOSIS — L7632 Postprocedural hematoma of skin and subcutaneous tissue following other procedure: Secondary | ICD-10-CM | POA: Diagnosis not present

## 2019-08-28 DIAGNOSIS — M1990 Unspecified osteoarthritis, unspecified site: Secondary | ICD-10-CM | POA: Diagnosis present

## 2019-08-28 DIAGNOSIS — Z9049 Acquired absence of other specified parts of digestive tract: Secondary | ICD-10-CM

## 2019-08-28 DIAGNOSIS — Z6832 Body mass index (BMI) 32.0-32.9, adult: Secondary | ICD-10-CM

## 2019-08-28 DIAGNOSIS — D631 Anemia in chronic kidney disease: Secondary | ICD-10-CM | POA: Diagnosis present

## 2019-08-28 DIAGNOSIS — Z23 Encounter for immunization: Secondary | ICD-10-CM

## 2019-08-28 DIAGNOSIS — R230 Cyanosis: Secondary | ICD-10-CM | POA: Diagnosis not present

## 2019-08-28 DIAGNOSIS — I10 Essential (primary) hypertension: Secondary | ICD-10-CM | POA: Diagnosis present

## 2019-08-28 DIAGNOSIS — Z886 Allergy status to analgesic agent status: Secondary | ICD-10-CM

## 2019-08-28 DIAGNOSIS — Z7901 Long term (current) use of anticoagulants: Secondary | ICD-10-CM

## 2019-08-28 DIAGNOSIS — K562 Volvulus: Secondary | ICD-10-CM | POA: Diagnosis present

## 2019-08-28 DIAGNOSIS — I959 Hypotension, unspecified: Secondary | ICD-10-CM | POA: Diagnosis not present

## 2019-08-28 DIAGNOSIS — Z79890 Hormone replacement therapy (postmenopausal): Secondary | ICD-10-CM

## 2019-08-28 DIAGNOSIS — N1832 Chronic kidney disease, stage 3b: Secondary | ICD-10-CM | POA: Diagnosis present

## 2019-08-28 DIAGNOSIS — H409 Unspecified glaucoma: Secondary | ICD-10-CM | POA: Diagnosis present

## 2019-08-28 DIAGNOSIS — D72829 Elevated white blood cell count, unspecified: Secondary | ICD-10-CM | POA: Diagnosis not present

## 2019-08-28 DIAGNOSIS — E039 Hypothyroidism, unspecified: Secondary | ICD-10-CM | POA: Diagnosis present

## 2019-08-28 DIAGNOSIS — K311 Adult hypertrophic pyloric stenosis: Secondary | ICD-10-CM | POA: Diagnosis not present

## 2019-08-28 DIAGNOSIS — Z880 Allergy status to penicillin: Secondary | ICD-10-CM

## 2019-08-28 DIAGNOSIS — E785 Hyperlipidemia, unspecified: Secondary | ICD-10-CM | POA: Diagnosis present

## 2019-08-28 DIAGNOSIS — Z888 Allergy status to other drugs, medicaments and biological substances status: Secondary | ICD-10-CM

## 2019-08-28 DIAGNOSIS — Z823 Family history of stroke: Secondary | ICD-10-CM

## 2019-08-28 DIAGNOSIS — M7071 Other bursitis of hip, right hip: Secondary | ICD-10-CM | POA: Diagnosis present

## 2019-08-28 DIAGNOSIS — I129 Hypertensive chronic kidney disease with stage 1 through stage 4 chronic kidney disease, or unspecified chronic kidney disease: Secondary | ICD-10-CM | POA: Diagnosis present

## 2019-08-28 DIAGNOSIS — K449 Diaphragmatic hernia without obstruction or gangrene: Secondary | ICD-10-CM

## 2019-08-28 DIAGNOSIS — K660 Peritoneal adhesions (postprocedural) (postinfection): Secondary | ICD-10-CM | POA: Diagnosis present

## 2019-08-28 DIAGNOSIS — Z79899 Other long term (current) drug therapy: Secondary | ICD-10-CM

## 2019-08-28 DIAGNOSIS — D6959 Other secondary thrombocytopenia: Secondary | ICD-10-CM | POA: Diagnosis not present

## 2019-08-28 DIAGNOSIS — R1013 Epigastric pain: Secondary | ICD-10-CM | POA: Diagnosis not present

## 2019-08-28 DIAGNOSIS — R1310 Dysphagia, unspecified: Secondary | ICD-10-CM | POA: Diagnosis present

## 2019-08-28 DIAGNOSIS — K219 Gastro-esophageal reflux disease without esophagitis: Secondary | ICD-10-CM | POA: Diagnosis present

## 2019-08-28 DIAGNOSIS — Z96649 Presence of unspecified artificial hip joint: Secondary | ICD-10-CM | POA: Diagnosis present

## 2019-08-28 DIAGNOSIS — I480 Paroxysmal atrial fibrillation: Secondary | ICD-10-CM | POA: Diagnosis present

## 2019-08-28 DIAGNOSIS — Z8673 Personal history of transient ischemic attack (TIA), and cerebral infarction without residual deficits: Secondary | ICD-10-CM

## 2019-08-28 DIAGNOSIS — I350 Nonrheumatic aortic (valve) stenosis: Secondary | ICD-10-CM | POA: Diagnosis present

## 2019-08-28 DIAGNOSIS — I739 Peripheral vascular disease, unspecified: Secondary | ICD-10-CM | POA: Diagnosis present

## 2019-08-28 DIAGNOSIS — D62 Acute posthemorrhagic anemia: Secondary | ICD-10-CM | POA: Diagnosis not present

## 2019-08-28 DIAGNOSIS — E876 Hypokalemia: Secondary | ICD-10-CM | POA: Diagnosis not present

## 2019-08-28 DIAGNOSIS — I70202 Unspecified atherosclerosis of native arteries of extremities, left leg: Secondary | ICD-10-CM | POA: Diagnosis present

## 2019-08-28 DIAGNOSIS — K3189 Other diseases of stomach and duodenum: Secondary | ICD-10-CM | POA: Diagnosis present

## 2019-08-28 DIAGNOSIS — Z20828 Contact with and (suspected) exposure to other viral communicable diseases: Secondary | ICD-10-CM | POA: Diagnosis present

## 2019-08-28 HISTORY — DX: Paroxysmal atrial fibrillation: I48.0

## 2019-08-28 NOTE — ED Triage Notes (Signed)
Pt BIB [facility] from home, c/o nausea, chest pain, and shortness of breath that started after eating tonight. Pain is non-radiating. Pt given 4NTG by EMS with little relief. Hx afib, taking xarelto. Pt refused asa, states she has an allergy.

## 2019-08-28 NOTE — ED Triage Notes (Signed)
Pt tested for COVID after a close family contact came back positive, pt denies symptoms.

## 2019-08-29 ENCOUNTER — Inpatient Hospital Stay (HOSPITAL_COMMUNITY): Payer: Medicare Other

## 2019-08-29 ENCOUNTER — Encounter (HOSPITAL_COMMUNITY): Payer: Self-pay | Admitting: Internal Medicine

## 2019-08-29 ENCOUNTER — Emergency Department (HOSPITAL_COMMUNITY): Payer: Medicare Other

## 2019-08-29 DIAGNOSIS — E876 Hypokalemia: Secondary | ICD-10-CM | POA: Diagnosis not present

## 2019-08-29 DIAGNOSIS — Z8673 Personal history of transient ischemic attack (TIA), and cerebral infarction without residual deficits: Secondary | ICD-10-CM | POA: Diagnosis not present

## 2019-08-29 DIAGNOSIS — I959 Hypotension, unspecified: Secondary | ICD-10-CM | POA: Diagnosis not present

## 2019-08-29 DIAGNOSIS — K3189 Other diseases of stomach and duodenum: Secondary | ICD-10-CM | POA: Diagnosis present

## 2019-08-29 DIAGNOSIS — E039 Hypothyroidism, unspecified: Secondary | ICD-10-CM | POA: Diagnosis present

## 2019-08-29 DIAGNOSIS — M79675 Pain in left toe(s): Secondary | ICD-10-CM | POA: Diagnosis not present

## 2019-08-29 DIAGNOSIS — L7632 Postprocedural hematoma of skin and subcutaneous tissue following other procedure: Secondary | ICD-10-CM | POA: Diagnosis not present

## 2019-08-29 DIAGNOSIS — D62 Acute posthemorrhagic anemia: Secondary | ICD-10-CM | POA: Diagnosis not present

## 2019-08-29 DIAGNOSIS — I97630 Postprocedural hematoma of a circulatory system organ or structure following a cardiac catheterization: Secondary | ICD-10-CM | POA: Diagnosis not present

## 2019-08-29 DIAGNOSIS — E785 Hyperlipidemia, unspecified: Secondary | ICD-10-CM | POA: Diagnosis not present

## 2019-08-29 DIAGNOSIS — I350 Nonrheumatic aortic (valve) stenosis: Secondary | ICD-10-CM | POA: Diagnosis present

## 2019-08-29 DIAGNOSIS — I70202 Unspecified atherosclerosis of native arteries of extremities, left leg: Secondary | ICD-10-CM | POA: Diagnosis present

## 2019-08-29 DIAGNOSIS — Z23 Encounter for immunization: Secondary | ICD-10-CM | POA: Diagnosis present

## 2019-08-29 DIAGNOSIS — R230 Cyanosis: Secondary | ICD-10-CM | POA: Diagnosis not present

## 2019-08-29 DIAGNOSIS — Z20828 Contact with and (suspected) exposure to other viral communicable diseases: Secondary | ICD-10-CM | POA: Diagnosis present

## 2019-08-29 DIAGNOSIS — N1832 Chronic kidney disease, stage 3b: Secondary | ICD-10-CM | POA: Diagnosis present

## 2019-08-29 DIAGNOSIS — R1310 Dysphagia, unspecified: Secondary | ICD-10-CM | POA: Diagnosis present

## 2019-08-29 DIAGNOSIS — R1013 Epigastric pain: Secondary | ICD-10-CM | POA: Diagnosis present

## 2019-08-29 DIAGNOSIS — I739 Peripheral vascular disease, unspecified: Secondary | ICD-10-CM | POA: Diagnosis not present

## 2019-08-29 DIAGNOSIS — I70212 Atherosclerosis of native arteries of extremities with intermittent claudication, left leg: Secondary | ICD-10-CM | POA: Diagnosis not present

## 2019-08-29 DIAGNOSIS — K311 Adult hypertrophic pyloric stenosis: Secondary | ICD-10-CM | POA: Diagnosis present

## 2019-08-29 DIAGNOSIS — Y838 Other surgical procedures as the cause of abnormal reaction of the patient, or of later complication, without mention of misadventure at the time of the procedure: Secondary | ICD-10-CM | POA: Diagnosis not present

## 2019-08-29 DIAGNOSIS — D72829 Elevated white blood cell count, unspecified: Secondary | ICD-10-CM | POA: Diagnosis not present

## 2019-08-29 DIAGNOSIS — D6959 Other secondary thrombocytopenia: Secondary | ICD-10-CM | POA: Diagnosis not present

## 2019-08-29 DIAGNOSIS — R52 Pain, unspecified: Secondary | ICD-10-CM | POA: Diagnosis not present

## 2019-08-29 DIAGNOSIS — L819 Disorder of pigmentation, unspecified: Secondary | ICD-10-CM | POA: Diagnosis not present

## 2019-08-29 DIAGNOSIS — K449 Diaphragmatic hernia without obstruction or gangrene: Secondary | ICD-10-CM | POA: Diagnosis present

## 2019-08-29 DIAGNOSIS — I4819 Other persistent atrial fibrillation: Secondary | ICD-10-CM | POA: Diagnosis present

## 2019-08-29 DIAGNOSIS — K562 Volvulus: Secondary | ICD-10-CM | POA: Diagnosis present

## 2019-08-29 DIAGNOSIS — G8929 Other chronic pain: Secondary | ICD-10-CM | POA: Diagnosis present

## 2019-08-29 DIAGNOSIS — K219 Gastro-esophageal reflux disease without esophagitis: Secondary | ICD-10-CM | POA: Diagnosis present

## 2019-08-29 DIAGNOSIS — H409 Unspecified glaucoma: Secondary | ICD-10-CM | POA: Diagnosis present

## 2019-08-29 DIAGNOSIS — I4891 Unspecified atrial fibrillation: Secondary | ICD-10-CM | POA: Diagnosis not present

## 2019-08-29 DIAGNOSIS — I10 Essential (primary) hypertension: Secondary | ICD-10-CM | POA: Diagnosis not present

## 2019-08-29 DIAGNOSIS — K660 Peritoneal adhesions (postprocedural) (postinfection): Secondary | ICD-10-CM | POA: Diagnosis present

## 2019-08-29 DIAGNOSIS — I129 Hypertensive chronic kidney disease with stage 1 through stage 4 chronic kidney disease, or unspecified chronic kidney disease: Secondary | ICD-10-CM | POA: Diagnosis present

## 2019-08-29 DIAGNOSIS — I480 Paroxysmal atrial fibrillation: Secondary | ICD-10-CM | POA: Diagnosis not present

## 2019-08-29 LAB — APTT
aPTT: 42 seconds — ABNORMAL HIGH (ref 24–36)
aPTT: 46 seconds — ABNORMAL HIGH (ref 24–36)

## 2019-08-29 LAB — CBC WITH DIFFERENTIAL/PLATELET
Abs Immature Granulocytes: 0.02 10*3/uL (ref 0.00–0.07)
Basophils Absolute: 0 10*3/uL (ref 0.0–0.1)
Basophils Relative: 0 %
Eosinophils Absolute: 0.1 10*3/uL (ref 0.0–0.5)
Eosinophils Relative: 2 %
HCT: 37.4 % (ref 36.0–46.0)
Hemoglobin: 11.9 g/dL — ABNORMAL LOW (ref 12.0–15.0)
Immature Granulocytes: 0 %
Lymphocytes Relative: 18 %
Lymphs Abs: 1.3 10*3/uL (ref 0.7–4.0)
MCH: 30.4 pg (ref 26.0–34.0)
MCHC: 31.8 g/dL (ref 30.0–36.0)
MCV: 95.4 fL (ref 80.0–100.0)
Monocytes Absolute: 0.5 10*3/uL (ref 0.1–1.0)
Monocytes Relative: 7 %
Neutro Abs: 5.4 10*3/uL (ref 1.7–7.7)
Neutrophils Relative %: 73 %
Platelets: 176 10*3/uL (ref 150–400)
RBC: 3.92 MIL/uL (ref 3.87–5.11)
RDW: 13.1 % (ref 11.5–15.5)
WBC: 7.4 10*3/uL (ref 4.0–10.5)
nRBC: 0 % (ref 0.0–0.2)

## 2019-08-29 LAB — HEPARIN LEVEL (UNFRACTIONATED): Heparin Unfractionated: 0.62 IU/mL (ref 0.30–0.70)

## 2019-08-29 LAB — COMPREHENSIVE METABOLIC PANEL
ALT: 14 U/L (ref 0–44)
AST: 16 U/L (ref 15–41)
Albumin: 3.9 g/dL (ref 3.5–5.0)
Alkaline Phosphatase: 81 U/L (ref 38–126)
Anion gap: 11 (ref 5–15)
BUN: 22 mg/dL (ref 8–23)
CO2: 23 mmol/L (ref 22–32)
Calcium: 9.5 mg/dL (ref 8.9–10.3)
Chloride: 106 mmol/L (ref 98–111)
Creatinine, Ser: 1.65 mg/dL — ABNORMAL HIGH (ref 0.44–1.00)
GFR calc Af Amer: 33 mL/min — ABNORMAL LOW (ref >60)
GFR calc non Af Amer: 28 mL/min — ABNORMAL LOW (ref >60)
Glucose, Bld: 130 mg/dL — ABNORMAL HIGH (ref 70–99)
Potassium: 3.9 mmol/L (ref 3.5–5.1)
Sodium: 140 mmol/L (ref 135–145)
Total Bilirubin: 0.6 mg/dL (ref 0.3–1.2)
Total Protein: 7 g/dL (ref 6.5–8.1)

## 2019-08-29 LAB — CBC
HCT: 36.9 % (ref 36.0–46.0)
Hemoglobin: 11.6 g/dL — ABNORMAL LOW (ref 12.0–15.0)
MCH: 30.5 pg (ref 26.0–34.0)
MCHC: 31.4 g/dL (ref 30.0–36.0)
MCV: 97.1 fL (ref 80.0–100.0)
Platelets: 172 10*3/uL (ref 150–400)
RBC: 3.8 MIL/uL — ABNORMAL LOW (ref 3.87–5.11)
RDW: 13 % (ref 11.5–15.5)
WBC: 8.8 10*3/uL (ref 4.0–10.5)
nRBC: 0 % (ref 0.0–0.2)

## 2019-08-29 LAB — SARS CORONAVIRUS 2 (TAT 6-24 HRS): SARS Coronavirus 2: NEGATIVE

## 2019-08-29 LAB — BASIC METABOLIC PANEL
Anion gap: 11 (ref 5–15)
BUN: 17 mg/dL (ref 8–23)
CO2: 24 mmol/L (ref 22–32)
Calcium: 9.2 mg/dL (ref 8.9–10.3)
Chloride: 104 mmol/L (ref 98–111)
Creatinine, Ser: 1.44 mg/dL — ABNORMAL HIGH (ref 0.44–1.00)
GFR calc Af Amer: 39 mL/min — ABNORMAL LOW (ref >60)
GFR calc non Af Amer: 33 mL/min — ABNORMAL LOW (ref >60)
Glucose, Bld: 159 mg/dL — ABNORMAL HIGH (ref 70–99)
Potassium: 4 mmol/L (ref 3.5–5.1)
Sodium: 139 mmol/L (ref 135–145)

## 2019-08-29 LAB — TROPONIN I (HIGH SENSITIVITY)
Troponin I (High Sensitivity): 6 ng/L (ref <18)
Troponin I (High Sensitivity): 8 ng/L (ref <18)

## 2019-08-29 LAB — GLUCOSE, CAPILLARY
Glucose-Capillary: 111 mg/dL — ABNORMAL HIGH (ref 70–99)
Glucose-Capillary: 123 mg/dL — ABNORMAL HIGH (ref 70–99)

## 2019-08-29 LAB — LIPASE, BLOOD: Lipase: 30 U/L (ref 11–51)

## 2019-08-29 MED ORDER — FENTANYL CITRATE (PF) 100 MCG/2ML IJ SOLN
50.0000 ug | Freq: Once | INTRAMUSCULAR | Status: AC
  Administered 2019-08-29: 01:00:00 50 ug via INTRAVENOUS
  Filled 2019-08-29: qty 2

## 2019-08-29 MED ORDER — MORPHINE SULFATE (PF) 2 MG/ML IV SOLN
2.0000 mg | INTRAVENOUS | Status: DC | PRN
  Administered 2019-08-29 – 2019-09-10 (×26): 2 mg via INTRAVENOUS
  Filled 2019-08-29 (×26): qty 1

## 2019-08-29 MED ORDER — FENTANYL CITRATE (PF) 100 MCG/2ML IJ SOLN
50.0000 ug | Freq: Once | INTRAMUSCULAR | Status: AC
  Administered 2019-08-29: 50 ug via INTRAVENOUS
  Filled 2019-08-29: qty 2

## 2019-08-29 MED ORDER — HYDRALAZINE HCL 20 MG/ML IJ SOLN: 10.0000 mg | INTRAMUSCULAR | Status: DC | PRN

## 2019-08-29 MED ORDER — INFLUENZA VAC A&B SA ADJ QUAD 0.5 ML IM PRSY
0.5000 mL | PREFILLED_SYRINGE | INTRAMUSCULAR | Status: AC
  Administered 2019-08-31: 0.5 mL via INTRAMUSCULAR
  Filled 2019-08-29 (×2): qty 0.5

## 2019-08-29 MED ORDER — LACTATED RINGERS IV BOLUS
500.0000 mL | Freq: Once | INTRAVENOUS | Status: AC
  Administered 2019-08-29: 500 mL via INTRAVENOUS

## 2019-08-29 MED ORDER — ONDANSETRON HCL 4 MG/2ML IJ SOLN
4.0000 mg | Freq: Once | INTRAMUSCULAR | Status: AC
  Administered 2019-08-29: 04:00:00 4 mg via INTRAVENOUS
  Filled 2019-08-29: qty 2

## 2019-08-29 MED ORDER — LATANOPROST 0.005 % OP SOLN: 1.0000 [drp] | Freq: Every day | OPHTHALMIC | Status: DC

## 2019-08-29 MED ORDER — LIDOCAINE VISCOUS HCL 2 % MT SOLN
15.0000 mL | Freq: Once | OROMUCOSAL | Status: AC
  Administered 2019-08-29: 15 mL via OROMUCOSAL
  Filled 2019-08-29: qty 15

## 2019-08-29 MED ORDER — FAMOTIDINE 20 MG PO TABS
20.0000 mg | ORAL_TABLET | Freq: Once | ORAL | Status: AC
  Administered 2019-08-29: 20 mg via ORAL
  Filled 2019-08-29: qty 1

## 2019-08-29 MED ORDER — HEPARIN (PORCINE) 25000 UT/250ML-% IV SOLN
1250.0000 [IU]/h | INTRAVENOUS | Status: DC
  Administered 2019-08-29: 1050 [IU]/h via INTRAVENOUS
  Administered 2019-08-30: 1200 [IU]/h via INTRAVENOUS
  Administered 2019-08-31 – 2019-09-01 (×2): 1250 [IU]/h via INTRAVENOUS
  Filled 2019-08-29 (×4): qty 250

## 2019-08-29 MED ORDER — LEVOTHYROXINE SODIUM 100 MCG/5ML IV SOLN
50.0000 ug | Freq: Every day | INTRAVENOUS | Status: DC
  Administered 2019-08-29 – 2019-09-07 (×10): 50 ug via INTRAVENOUS
  Filled 2019-08-29 (×11): qty 5

## 2019-08-29 MED ORDER — ACETAMINOPHEN 325 MG PO TABS: 650.0000 mg | ORAL_TABLET | Freq: Four times a day (QID) | ORAL | Status: DC | PRN

## 2019-08-29 MED ORDER — ONDANSETRON HCL 4 MG PO TABS: 4.0000 mg | ORAL_TABLET | Freq: Four times a day (QID) | ORAL | Status: DC | PRN

## 2019-08-29 MED ORDER — ALUM & MAG HYDROXIDE-SIMETH 200-200-20 MG/5ML PO SUSP
30.0000 mL | Freq: Once | ORAL | Status: AC
  Administered 2019-08-29: 30 mL via ORAL
  Filled 2019-08-29: qty 30

## 2019-08-29 MED ORDER — PANTOPRAZOLE SODIUM 40 MG IV SOLR
40.0000 mg | INTRAVENOUS | Status: DC
  Administered 2019-08-29: 40 mg via INTRAVENOUS
  Filled 2019-08-29: qty 40

## 2019-08-29 MED ORDER — LIDOCAINE VISCOUS HCL 2 % MT SOLN
OROMUCOSAL | Status: AC
  Administered 2019-08-29: 7 mL via OROMUCOSAL
  Filled 2019-08-29: qty 15

## 2019-08-29 MED ORDER — ONDANSETRON HCL 4 MG/2ML IJ SOLN
4.0000 mg | Freq: Four times a day (QID) | INTRAMUSCULAR | Status: DC | PRN
  Administered 2019-08-29 – 2019-08-31 (×3): 4 mg via INTRAVENOUS
  Filled 2019-08-29 (×3): qty 2

## 2019-08-29 MED ORDER — FENTANYL CITRATE (PF) 100 MCG/2ML IJ SOLN
25.0000 ug | INTRAMUSCULAR | Status: DC | PRN
  Administered 2019-08-29: 25 ug via INTRAVENOUS
  Filled 2019-08-29: qty 2

## 2019-08-29 MED ORDER — LIDOCAINE VISCOUS HCL 2 % MT SOLN
5.0000 mL | Freq: Once | OROMUCOSAL | Status: AC
  Administered 2019-08-29: 12:00:00 7 mL via OROMUCOSAL

## 2019-08-29 MED ORDER — DEXTROSE-NACL 5-0.9 % IV SOLN
INTRAVENOUS | Status: AC
  Administered 2019-08-29 – 2019-08-30 (×3): via INTRAVENOUS

## 2019-08-29 MED ORDER — METOCLOPRAMIDE HCL 5 MG/ML IJ SOLN
10.0000 mg | Freq: Once | INTRAMUSCULAR | Status: AC
  Administered 2019-08-29: 10 mg via INTRAVENOUS
  Filled 2019-08-29: qty 2

## 2019-08-29 MED ORDER — LEVOTHYROXINE SODIUM 100 MCG/5ML IV SOLN
50.0000 ug | Freq: Every day | INTRAVENOUS | Status: DC
  Filled 2019-08-29 (×2): qty 5

## 2019-08-29 MED ORDER — PROMETHAZINE HCL 25 MG/ML IJ SOLN
12.5000 mg | Freq: Four times a day (QID) | INTRAMUSCULAR | Status: DC | PRN
  Administered 2019-08-29 – 2019-09-02 (×9): 12.5 mg via INTRAVENOUS
  Filled 2019-08-29 (×9): qty 1

## 2019-08-29 MED ORDER — LACTATED RINGERS IV BOLUS
1000.0000 mL | Freq: Once | INTRAVENOUS | Status: AC
  Administered 2019-08-29: 1000 mL via INTRAVENOUS

## 2019-08-29 MED ORDER — ACETAMINOPHEN 650 MG RE SUPP: 650.0000 mg | Freq: Four times a day (QID) | RECTAL | Status: DC | PRN

## 2019-08-29 MED ORDER — WHITE PETROLATUM EX OINT
TOPICAL_OINTMENT | CUTANEOUS | Status: AC
  Filled 2019-08-29: qty 28.35

## 2019-08-29 MED ORDER — ONDANSETRON HCL 4 MG/2ML IJ SOLN
4.0000 mg | Freq: Once | INTRAMUSCULAR | Status: AC
  Administered 2019-08-29: 4 mg via INTRAVENOUS
  Filled 2019-08-29: qty 2

## 2019-08-29 MED ORDER — LATANOPROST 0.005 % OP SOLN
1.0000 [drp] | Freq: Every day | OPHTHALMIC | Status: DC
  Administered 2019-08-29 – 2019-09-25 (×27): 1 [drp] via OPHTHALMIC
  Filled 2019-08-29 (×2): qty 2.5

## 2019-08-29 NOTE — ED Notes (Signed)
Per Magda Paganini, RN, multiple attempts made to insert NG tube; pt unable to tolerate; Dr. Dayna Barker made aware by Magda Paganini, RN

## 2019-08-29 NOTE — ED Notes (Signed)
Patient transported to CT 

## 2019-08-29 NOTE — H&P (Signed)
History and Physical    DESJA BORO [id number] DOB: Mar 06, 1934 DOA: 08/28/2019  PCP: Drake Leach, MD  Patient coming from: Home.  Chief Complaint: Epigastric pain.  HPI: Alyssa Odonnell is a 85 y.o. female with history of paroxysmal atrial fibrillation, stroke, hypothyroidism presents to the ER with sudden onset of worsening epigastric pain after having dinner last night around 8 PM.  Patient felt nauseated but was unable to vomit.  Pain was diffuse across the chest.  ED Course: In the ER CT scan of the abdomen pelvis shows volvulus of the hiatal hernia and which is concerning for gastric outlet obstruction.  ER physician discussed with Dr. Redmond Pulling on call general surgeon will be seeing patient in consult advised to place NG tube in hospitalist admission.  Labs reveal creatinine 1.6 hemoglobin 11.9 platelets 176 EKG shows normal sinus rhythm.  COVID-19 test is pending.  Review of Systems: As per HPI, rest all negative.   Past Medical History:  Diagnosis Date   Glaucoma    Hyperlipidemia    Hypertension    Hypothyroid    Stroke [facility])     Past Surgical History:  Procedure Laterality Date   ABDOMINAL HERNIA REPAIR     LOOP RECORDER INSERTION N/A 03/11/2017   Procedure: Loop Recorder Insertion;  Surgeon: Evans Lance, MD;  Location: [place] [facility];  Service: Cardiovascular;  Laterality: N/A;   PERIPHERAL VASCULAR CATHETERIZATION Right 07/06/2016   Procedure: Lower Extremity Angiography;  Surgeon: Angelia Mould, MD;  Location: [place] [facility];  Service: Cardiovascular;  Laterality: Right;   PERIPHERAL VASCULAR CATHETERIZATION Right 07/06/2016   Procedure: Peripheral Vascular Intervention;  Surgeon: Angelia Mould, MD;  Location: [place] [facility];  Service: Cardiovascular;  Laterality: Right;  Superficial femoral   TEE WITHOUT CARDIOVERSION N/A 03/11/2017   Procedure: TRANSESOPHAGEAL ECHOCARDIOGRAM (TEE);  Surgeon: Acie Fredrickson Wonda Cheng, MD;   Location: [facility] ENDOS[facility];  Service: Cardiovascular;  Laterality: N/A;   TOTAL HIP ARTHROPLASTY       reports that she has never smoked. She has never used smokeless tobacco. She reports that she does not drink alcohol or use drugs.  Allergies  Allergen Reactions   Aspirin Other (See Comments)    Red splotches   Atorvastatin Other (See Comments)    Muscle spams   Nabumetone Itching   Nsaids     Reaction (??)   Pravastatin Other (See Comments)    Stiff walking difficulty Stiff walking difficulty   Procaine Swelling   Rosuvastatin Nausea Only and Other (See Comments)    Reflux and Muscle stiffness   Simvastatin Nausea Only and Other (See Comments)    Reflux and Muscle stiffness   Statins Other (See Comments)   Evolocumab Rash   Mobic [Meloxicam] Rash   Penicillins Rash    Has patient had a PCN reaction causing immediate rash, facial/tongue/throat swelling, SOB or lightheadedness with hypotension: Yes Has patient had a PCN reaction causing severe rash involving mucus membranes or skin necrosis: No Has patient had a PCN reaction that required hospitalization: No Has patient had a PCN reaction occurring within the last 10 years: No If all of the above answers are "NO", then may proceed with Cephalosporin use.      Family History  Problem Relation Age of Onset   Stroke Father     Prior to Admission medications   Medication Sig Start Date End Date Taking? Authorizing Provider  acetaminophen (TYLENOL) 500 MG tablet Take 1,000 mg by mouth every  morning.     [provider]  apixaban (ELIQUIS) 2.5 MG TABS tablet Take 1 tablet (2.5 mg total) by mouth 2 (two) times daily. 03/11/18   Evans Lance, MD  bimatoprost (LUMIGAN) 0.03 % ophthalmic solution Administer 1 drop to both eyes every evening.    [provider]  ELIQUIS 5 MG TABS tablet TAKE 1 TABLET BY MOUTH TWO  TIMES DAILY 03/14/19   Evans Lance, MD  ERGOCALCIFEROL PO Take by mouth.     [provider]  furosemide (LASIX) 40 MG tablet Take 40 mg by mouth daily as needed for edema. (swelling in feet) 05/11/16   [provider]  latanoprost (XALATAN) 0.005 % ophthalmic solution Place 1 drop into both eyes at bedtime. 05/11/16   [provider]  levothyroxine (SYNTHROID, LEVOTHROID) 100 MCG tablet Take 100 mcg by mouth daily before breakfast.  05/11/16   [provider]  olmesartan-hydrochlorothiazide (BENICAR HCT) 20-12.5 MG tablet Take 1 tablet by mouth daily.    [provider]  omeprazole (PRILOSEC) 20 MG capsule Take 20 mg by mouth daily before breakfast. 08/05/16   [provider]  sertraline (ZOLOFT) 100 MG tablet Take 150 mg by mouth every morning. 08/05/16   [provider]  triamcinolone cream (KENALOG) 0.1 % Apply 1 application topically daily as needed for itching.    [provider]    Physical Exam: Constitutional: Moderately built and nourished. Vitals:   08/29/19 0115 08/29/19 0130 08/29/19 0145 08/29/19 0200  BP: (!) 169/91 (!) 139/111 (!) 176/100 (!) 167/98  Pulse: 81 85 85 85  Resp: (!) 26 19 18  (!) 22  Temp:      TempSrc:      SpO2: 99% 98% 99% 96%   Eyes: Anicteric no pallor. ENMT: No discharge from the ears eyes nose or mouth. Neck: No mass felt.  No neck rigidity. Respiratory: No rhonchi or crepitations. Cardiovascular: S1-S2 heard. Abdomen: Soft nontender bowel sounds present. Musculoskeletal: No edema. Skin: No rash. Neurologic: Alert awake oriented to time place and person.  Moves all extremities. Psychiatric: Appears normal.  Normal affect.   Labs on Admission: I have personally reviewed following labs and imaging studies  CBC: Recent Labs  Lab 08/29/19 0004  WBC 7.4  NEUTROABS 5.4  HGB 11.9*  HCT 37.4  MCV 95.4  PLT 0000000   Basic Metabolic Panel: Recent Labs  Lab 08/29/19 0004  NA 140  K 3.9  CL 106  CO2 23  GLUCOSE 130*  BUN 22  CREATININE 1.65*    CALCIUM 9.5   GFR: CrCl cannot be calculated (Unknown ideal weight.). Liver Function Tests: Recent Labs  Lab 08/29/19 0004  AST 16  ALT 14  ALKPHOS 81  BILITOT 0.6  PROT 7.0  ALBUMIN 3.9   Recent Labs  Lab 08/29/19 0004  LIPASE 30   No results for input(s): AMMONIA in the last 168 hours. Coagulation Profile: No results for input(s): INR, PROTIME in the last 168 hours. Cardiac Enzymes: No results for input(s): CKTOTAL, CKMB, CKMBINDEX, TROPONINI in the last 168 hours. BNP (last 3 results) No results for input(s): PROBNP in the last 8760 hours. HbA1C: No results for input(s): HGBA1C in the last 72 hours. CBG: No results for input(s): GLUCAP in the last 168 hours. Lipid Profile: No results for input(s): CHOL, HDL, LDLCALC, TRIG, CHOLHDL, LDLDIRECT in the last 72 hours. Thyroid Function Tests: No results for input(s): TSH, T4TOTAL, FREET4, T3FREE, THYROIDAB in the last 72 hours. Anemia Panel:  No results for input(s): VITAMINB12, FOLATE, FERRITIN, TIBC, IRON, RETICCTPCT in the last 72 hours. Urine analysis: No results found for: COLORURINE, APPEARANCEUR, LABSPEC, PHURINE, GLUCOSEU, HGBUR, BILIRUBINUR, KETONESUR, PROTEINUR, UROBILINOGEN, NITRITE, LEUKOCYTESUR Sepsis Labs: @LABRCNTIP (procalcitonin:4,lacticidven:4) )No results found for this or any previous visit (from the past 240 hour(s)).   Radiological Exams on Admission: Ct Abdomen Pelvis Wo Contrast  Result Date: 08/29/2019 CLINICAL DATA:  Abdominal pain, acute EXAM: CT ABDOMEN AND PELVIS WITHOUT CONTRAST TECHNIQUE: Multidetector CT imaging of the abdomen and pelvis was performed following the standard protocol without IV contrast. COMPARISON:  August 03, 2016 FINDINGS: Lower chest: The visualized heart size within normal limits. No pericardial fluid/thickening. Again noted is a large hiatal hernia with almost the entirety of the stomach rotated and flipped upward is, consistent with a gastric volvulus. The portion of  the herniated stomach is markedly dilated with air and food stuff which suggest gastric outlet obstruction. Hepatobiliary: Although limited due to the lack of intravenous contrast, normal in appearance without gross focal abnormality. The patient is status post cholecystectomy. No biliary ductal dilation. Pancreas:  Unremarkable.  No surrounding inflammatory changes. Spleen: Normal in size. Although limited due to the lack of intravenous contrast, normal in appearance. Adrenals/Urinary Tract: Both adrenal glands appear normal. No renal or collecting system calculi. There is left renal atrophy seen. The bladder is unremarkable. Stomach/Bowel: The small bowel and colon are normal in appearance. Scattered colonic diverticula are noted without diverticulitis. Vascular/Lymphatic: There are no enlarged abdominal or pelvic lymph nodes. Aortic Atherosclerosis (ICD10-I70.0). Reproductive: Calcified uterine fibroids are present. Other: No evidence of abdominal wall mass or hernia. Musculoskeletal: No acute or significant osseous findings. IMPRESSION: Large hiatal hernia with gastric volvulus as on prior exam, however now there is a markedly dilated debris-filled stomach which is suggestive of gastric outlet obstruction. Electronically Signed   By: Prudencio Pair M.D.   On: 08/29/2019 03:13   Dg Chest Portable 1 View  Result Date: 08/29/2019 CLINICAL DATA:  Chest pain and shortness of breath EXAM: PORTABLE CHEST 1 VIEW COMPARISON:  08/03/2016 FINDINGS: Cardiac shadow is stable. Aortic calcifications are seen. Loop recorder is noted on the left. Large hiatal hernia is again seen but increased when compared with the prior study. No focal infiltrate or sizable effusion is seen. No bony abnormality is noted. IMPRESSION: Changes consistent with large hiatal hernia which is increased in size from the prior study. No new focal abnormality is noted. Electronically Signed   By: Inez Catalina M.D.   On: 08/29/2019 00:30    EKG:  Independently reviewed.  Normal sinus rhythm.  Assessment/Plan Principal Problem:   Volvulus of stomach Active Problems:   PAD (peripheral artery disease) (HCC)   Essential hypertension   PAF (paroxysmal atrial fibrillation) (HCC)   Gastric outlet obstruction    1. Volvulus of the stomach with gastric outlet obstruction for which general surgery has been consulted we will keep patient on NG tube and n.p.o. and further recommendation per general surgery. 2. History of proximal atrial fibrillation on apixaban last dose was taken last night prior to eating dinner.  If patient is continue to be observed without procedure will need to start heparin.  Patient is not on any rate limiting medications. 3. Hypertension we will keep patient on as needed IV hydralazine since patient is n.p.o. 4. Hypothyroidism on Synthroid which will be dosed as IV. 5. Chronic kidney disease stage III creatinine appears to be at baseline. 6. Anemia likely from renal disease follow CBC.  Given  that patient has gastric outlet obstruction with volvulus will need more than 2 midnight stay in inpatient status.  COVID-19 test is pending.   DVT prophylaxis: We will need to start heparin if patient is not planning to have any surgery since patient has A. fib. Code Status: Full code. Family Communication: Discussed with patient. Disposition Plan: Home. Consults called: General surgery. Admission status: Inpatient.   Rise Patience MD [facility] Pager [phone].  If 7PM-7AM, please contact night-coverage [email or web address] Password [email or web address]  08/29/2019, 4:56 AM

## 2019-08-29 NOTE — ED Provider Notes (Signed)
Emergency Department Provider Note   I have reviewed the triage vital signs and the nursing notes.   HISTORY  Chief Complaint Chest Pain   HPI Alyssa Odonnell is a 85 y.o. female with medical problems documented below who presents the emergency department today with chest pain.  Patient states that she was eating around 8:00 and about 5 or 10 minutes afterwards she had onset of severe nausea.  She had dry heaving no vomiting.  She has had some clear sputum,.  This is associated with a sharp retrosternal and epigastric chest pain.  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  She states that she has had a cholecystectomy in the past.  No alcohol, drugs or tobacco.  The meal she had eaten was relatively mild consisting of greens, potatoes and macaroni and cheese.  No rashes.  No fevers.  No respiratory symptoms.  No recent diarrhea or constipation in the last few days he did have some type of viral diarrhea last week.   No other associated or modifying symptoms.    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[place]) 05/24/2017   Cerebrovascular accident (CVA) due to embolism of left posterior cerebral artery ([place]) 03/09/2017   htn 03/09/2017   Weakness 03/09/2017   Stroke-like symptom 03/09/2017   Right sided weakness    PAD (peripheral artery disease) ([place])    Essential hypertension    Spinal stenosis of lumbar region    Pure hypercholesterolemia    Recurrent major depressive disorder [facility])     Past Surgical History:  Procedure Laterality Date   ABDOMINAL HERNIA REPAIR     LOOP RECORDER INSERTION N/A 03/11/2017   Procedure: Loop Recorder Insertion;  Surgeon: Evans Lance, MD;   Location: [place] CV LAB;  Service: Cardiovascular;  Laterality: N/A;   PERIPHERAL VASCULAR CATHETERIZATION Right 07/06/2016   Procedure: Lower Extremity Angiography;  Surgeon: Angelia Mould, MD;  Location: [place] CV LAB;  Service: Cardiovascular;  Laterality: Right;   PERIPHERAL VASCULAR CATHETERIZATION Right 07/06/2016   Procedure: Peripheral Vascular Intervention;  Surgeon: Angelia Mould, MD;  Location: [place] [facility] LAB;  Service: Cardiovascular;  Laterality: Right;  Superficial femoral   TEE WITHOUT CARDIOVERSION N/A 03/11/2017   Procedure: TRANSESOPHAGEAL ECHOCARDIOGRAM (TEE);  Surgeon: Acie Fredrickson Wonda Cheng, MD;  Location: [place];  Service: Cardiovascular;  Laterality: N/A;   TOTAL HIP ARTHROPLASTY      Current Outpatient Rx   Order #: [id number] Class: Historical Med   Order #: [id number] Class: No Print   Order #: [id number] Class: Historical Med   Order #: [id number] Class: Normal   Order #: [id number] Class: Historical Med   Order #: [id number] Class: Historical Med   Order #: [id number] Class: Historical Med   Order #: [id number] Class: Historical Med   Order #: [id number] Class: Historical Med   Order #: [id number] Class: Historical Med   Order #: [id number] Class: Historical Med   Order #: [id number] Class: Historical Med    Allergies Aspirin, Atorvastatin, Nabumetone, Nsaids, Pravastatin, Procaine, Rosuvastatin, Simvastatin, Statins, Evolocumab, Mobic [meloxicam], and Penicillins  Family History  Problem Relation Age of Onset   Stroke Father     Social History Social History  Tobacco Use   Smoking status: Never Smoker   Smokeless tobacco: Never Used  Substance Use Topics   Alcohol use: No   Drug use: No    Review of Systems  All other systems negative except as documented in the HPI. All pertinent positives and negatives as reviewed in the HPI. ____________________________________________   PHYSICAL EXAM:  VITAL SIGNS: ED  Triage Vitals  Enc Vitals Group     BP 08/29/19 0000 (!) 157/95     Pulse Rate 08/29/19 0000 90     Resp 08/29/19 0000 (!) 21     Temp 08/29/19 0002 97.8 F (36.6 C)     Temp Source 08/29/19 0002 Oral     SpO2 08/29/19 0000 99 %    Constitutional: Alert and oriented. Well appearing and in no acute distress. Eyes: Conjunctivae are normal. PERRL. EOMI. Head: Atraumatic. Nose: No congestion/rhinnorhea. Mouth/Throat: Mucous membranes are moist.  Oropharynx non-erythematous. Neck: No stridor.  No meningeal signs.   Cardiovascular: Normal rate, regular rhythm. Good peripheral circulation. Grossly normal heart sounds.   Respiratory: Normal respiratory effort.  No retractions. Lungs CTAB. Gastrointestinal: Soft and nontender. No distention.  Musculoskeletal: No lower extremity tenderness nor edema. No gross deformities of extremities. Neurologic:  Normal speech and language. No gross focal neurologic deficits are appreciated.  Skin:  Skin is warm, dry and intact. No rash noted.  ____________________________________________   LABS (all labs ordered are listed, but only abnormal results are displayed)  Labs Reviewed  CBC WITH DIFFERENTIAL/PLATELET - Abnormal; Notable for the following components:      Result Value   Hemoglobin 11.9 (*)    All other components within normal limits  COMPREHENSIVE METABOLIC PANEL - Abnormal; Notable for the following components:   Glucose, Bld 130 (*)    Creatinine, Ser 1.65 (*)    GFR calc non Af Amer 28 (*)    GFR calc Af Amer 33 (*)    All other components within normal limits  SARS CORONAVIRUS 2 (TAT 6-24 HRS)  LIPASE, BLOOD  TROPONIN I (HIGH SENSITIVITY)  TROPONIN I (HIGH SENSITIVITY)   ____________________________________________  EKG   EKG Interpretation  Date/Time:  Monday August 28 2019 23:56:48 EST Ventricular Rate:  92 PR Interval:    QRS Duration: 77 QT Interval:  356 QTC Calculation: 441 R Axis:   14 Text  Interpretation: Sinus rhythm No significant change since last tracing Confirmed by Merrily Pew [phone]) on 08/29/2019 12:17:30 AM       ____________________________________________  RADIOLOGY  Ct Abdomen Pelvis Wo Contrast  Result Date: 08/29/2019 CLINICAL DATA:  Abdominal pain, acute EXAM: CT ABDOMEN AND PELVIS WITHOUT CONTRAST TECHNIQUE: Multidetector CT imaging of the abdomen and pelvis was performed following the standard protocol without IV contrast. COMPARISON:  August 03, 2016 FINDINGS: Lower chest: The visualized heart size within normal limits. No pericardial fluid/thickening. Again noted is a large hiatal hernia with almost the entirety of the stomach rotated and flipped upward is, consistent with a gastric volvulus. The portion of the herniated stomach is markedly dilated with air and food stuff which suggest gastric outlet obstruction. Hepatobiliary: Although limited due to the lack of intravenous contrast, normal in appearance without gross focal abnormality. The patient is status post cholecystectomy. No biliary ductal dilation. Pancreas:  Unremarkable.  No surrounding inflammatory changes. Spleen: Normal in size. Although limited due to the lack of intravenous contrast, normal in appearance. Adrenals/Urinary Tract: Both adrenal glands appear normal. No renal or collecting system calculi. There is left renal  atrophy seen. The bladder is unremarkable. Stomach/Bowel: The small bowel and colon are normal in appearance. Scattered colonic diverticula are noted without diverticulitis. Vascular/Lymphatic: There are no enlarged abdominal or pelvic lymph nodes. Aortic Atherosclerosis (ICD10-I70.0). Reproductive: Calcified uterine fibroids are present. Other: No evidence of abdominal wall mass or hernia. Musculoskeletal: No acute or significant osseous findings. IMPRESSION: Large hiatal hernia with gastric volvulus as on prior exam, however now there is a markedly dilated debris-filled stomach  which is suggestive of gastric outlet obstruction. Electronically Signed   By: Prudencio Pair M.D.   On: 08/29/2019 03:13   Dg Chest Portable 1 View  Result Date: 08/29/2019 CLINICAL DATA:  Chest pain and shortness of breath EXAM: PORTABLE CHEST 1 VIEW COMPARISON:  08/03/2016 FINDINGS: Cardiac shadow is stable. Aortic calcifications are seen. Loop recorder is noted on the left. Large hiatal hernia is again seen but increased when compared with the prior study. No focal infiltrate or sizable effusion is seen. No bony abnormality is noted. IMPRESSION: Changes consistent with large hiatal hernia which is increased in size from the prior study. No new focal abnormality is noted. Electronically Signed   By: Inez Catalina M.D.   On: 08/29/2019 00:30    ____________________________________________   PROCEDURES  Procedure(s) performed:   Procedures   ____________________________________________   INITIAL IMPRESSION / ASSESSMENT AND PLAN / ED COURSE  Symptoms sound very much like GI related.  She is a 85 years old history of hypertension and strokes and she does have risk factors for heart disease but the normal EKG and very atypical story for like delta troponins is more than enough to rule out ACS.  At same time we will do CT scan, medications try to help with her symptoms to ensure she does have pancreatitis, perforated ulcer or other significant pathology.  Work-up ultimately revealed an enlarging hiatal hernia and associated gastric volvulus causing gastric outlet obstruction.  Discussed with Dr. Redmond Pulling with surgery who recommends holding Eliquis, inserting NG tube and they will consult on patient for further management likely needing surgery after appropriate planning.  Discussed with patient and medicine team for admission.  Patient currently stable at this time.  Pertinent labs & imaging results that were available during my care of the patient were reviewed by me and considered in my medical  decision making (see chart for details).  ____________________________________________  FINAL CLINICAL IMPRESSION(S) / ED DIAGNOSES  Final diagnoses:  Gastric outlet obstruction    MEDICATIONS GIVEN DURING THIS VISIT:  Medications  fentaNYL (SUBLIMAZE) injection 50 mcg (has no administration in time range)  alum & mag hydroxide-simeth (MAALOX/MYLANTA) 200-200-20 MG/5ML suspension 30 mL (30 mLs Oral Given 08/29/19 0032)  lidocaine (XYLOCAINE) 2 % viscous mouth solution 15 mL (15 mLs Mouth/Throat Given 08/29/19 0032)  famotidine (PEPCID) tablet 20 mg (20 mg Oral Given 08/29/19 0032)  ondansetron (ZOFRAN) injection 4 mg (4 mg Intravenous Given 08/29/19 0030)  fentaNYL (SUBLIMAZE) injection 50 mcg (50 mcg Intravenous Given 08/29/19 0030)  lactated ringers bolus 500 mL (0 mLs Intravenous Stopped 08/29/19 0135)  fentaNYL (SUBLIMAZE) injection 50 mcg (50 mcg Intravenous Given 08/29/19 0201)  metoCLOPramide (REGLAN) injection 10 mg (10 mg Intravenous Given 08/29/19 0201)  lactated ringers bolus 1,000 mL (1,000 mLs Intravenous New Bag/Given 08/29/19 0246)     NEW OUTPATIENT MEDICATIONS STARTED DURING THIS VISIT:  New Prescriptions   No medications on file    Note:  This note was prepared with assistance of Dragon voice recognition software. Occasional wrong-word or sound-a-like substitutions  may have occurred due to the inherent limitations of voice recognition software.   Michal Strzelecki, Corene Cornea, MD 08/29/19 [phone]

## 2019-08-29 NOTE — ED Notes (Signed)
Provider is at the bedside. 

## 2019-08-29 NOTE — Progress Notes (Signed)
PROGRESS NOTE   Alyssa Odonnell  [id number]    DOB: [birth year]    DOA: 08/28/2019  PCP: Drake Leach, MD   I have briefly reviewed patients previous medical records in [facility].  Chief Complaint  Patient presents with  . Chest Pain    Brief Narrative:  85 year old female, lives with her daughter, ambulates with the help of a walker, PMH of PAF on Eliquis, embolic CVA, HTN, HLD, hypothyroid, PAD, cholecystectomy, presented to [facility] ED on 08/28/2019 due to abrupt onset of severe nausea, dry heaving without vomiting, chest/epigastric abdominal pain.  CT abdomen showed large hiatal hernia with gastric volvulus with gastric outlet obstruction.  Multiple unsuccessful attempts to pass NG tube at bedside.  IR consulted for NG tube under fluoroscopy.  General surgery consulted and have tentatively plan for OR on Thursday if no improvement with medical management.   Assessment & Plan:   Principal Problem:   Volvulus of stomach Active Problems:   PAD (peripheral artery disease) (HCC)   Essential hypertension   PAF (paroxysmal atrial fibrillation) (HCC)   Gastric outlet obstruction   Large hiatal hernia with gastric outlet obstruction  CT abdomen and pelvis 11/10: Large hiatal hernia with gastric volvulus, markedly dilated debris-filled stomach suggestive of gastric outlet obstruction.  Multiple failed attempts to place NG tube in ED.  IR consulted to place under fluoroscopy.  Treating conservatively with bowel rest, NG tube, IV fluids, holding Eliquis in case surgery becomes necessary.  General surgery input appreciated.  Plan for laparoscopic versus open reduction and gastropexy with G-tube on 11/12, if does not improve with medical management.  If surgery planned, will need preop cardiology clearance.  Continue IV PPI.  Paroxysmal A. Fib  Not on rate control medications PTA.  Monitor closely on telemetry.  CHA2DS2-VASc score: 7.  High risk for embolic CVA.  Eliquis on  hold in case she were to need surgery.  Will bridge with IV heparin (surgery has cleared) which can be held a few hours prior to surgery.  TTE 03/10/2017: LVEF 60 to 65%  Sees Dr. Cristopher Peru, EP cardiology.  Essential hypertension  Mildly uncontrolled.  Continue as needed IV hydralazine.  Benicar HCTZ on hold.  Hyperlipidemia  Not on statins PTA.  Hypothyroid  Continue IV Synthroid.  History of embolic CVA  Likely secondary to A. fib.  IV heparin for now.  Chronic kidney disease stage IIIb  Presented with creatinine of 1.65 which has improved to 1.44 which may be her baseline creatinine.  Follow daily BMP.  Anemia, possibly due to chronic kidney disease versus chronic disease  Stable.  Atypical chest pain  Likely GI in origin from GOO  HS troponins x2: Negative.    DVT prophylaxis: SCDs, IV heparin.  Eliquis on hold Code Status: Full Family Communication: None at bedside Disposition: To be determined pending clinical improvement   Consultants:  General surgery  Procedures:  None  Antimicrobials:  None   Subjective: Patient seen in the ED this morning.  Reports ongoing nausea without vomiting.  Last BM 11/10.  Upper abdominal pain, not relieved with fentanyl.  Reports chronic leg swellings.  Denies any other complete  Objective:  Vitals:   08/29/19 0845 08/29/19 0900 08/29/19 1015 08/29/19 1030  BP: (!) 151/86 (!) 159/89 (!) 154/87 (!) 155/84  Pulse:    84  Resp: 20 (!) 24 (!) 22 20  Temp:      TempSrc:      SpO2:    100%  Examination:  General exam: Pleasant elderly female, moderately built and nourished lying comfortably propped up in bed. Respiratory system: Clear to auscultation. Respiratory effort normal. Cardiovascular system: S1 & S2 heard, RRR. No JVD, murmurs, rubs, gallops or clicks.  Trace bilateral ankle edema Gastrointestinal system: Abdomen with mild upper abdominal distention and tenderness without rigidity, guarding or  rebound.  No organomegaly or masses appreciated.  Normal bowel sounds heard. Central nervous system: Alert and oriented x2. No focal neurological deficits. Extremities: Symmetric 5 x 5 power. Skin: No rashes, lesions or ulcers Psychiatry: Judgement and insight appear somewhat impaired. Mood & affect appropriate.     Data Reviewed: I have personally reviewed following labs and imaging studies  CBC: Recent Labs  Lab 08/29/19 0004 08/29/19 0738  WBC 7.4 8.8  NEUTROABS 5.4  --   HGB 11.9* 11.6*  HCT 37.4 36.9  MCV 95.4 97.1  PLT 176 [id number]   Basic Metabolic Panel: Recent Labs  Lab 08/29/19 0004 08/29/19 0738  NA 140 139  K 3.9 4.0  CL 106 104  CO2 23 24  GLUCOSE 130* 159*  BUN 22 17  CREATININE 1.65* 1.44*  CALCIUM 9.5 9.2   Liver Function Tests: Recent Labs  Lab 08/29/19 0004  AST 16  ALT 14  ALKPHOS 81  BILITOT 0.6  PROT 7.0  ALBUMIN 3.9    Cardiac Enzymes: No results for input(s): CKTOTAL, CKMB, CKMBINDEX, TROPONINI in the last 168 hours.  CBG: No results for input(s): GLUCAP in the last 168 hours.  No results found for this or any previous visit (from the past 240 hour(s)).       Radiology Studies: Ct Abdomen Pelvis Wo Contrast  Result Date: 08/29/2019 CLINICAL DATA:  Abdominal pain, acute EXAM: CT ABDOMEN AND PELVIS WITHOUT CONTRAST TECHNIQUE: Multidetector CT imaging of the abdomen and pelvis was performed following the standard protocol without IV contrast. COMPARISON:  August 03, 2016 FINDINGS: Lower chest: The visualized heart size within normal limits. No pericardial fluid/thickening. Again noted is a large hiatal hernia with almost the entirety of the stomach rotated and flipped upward is, consistent with a gastric volvulus. The portion of the herniated stomach is markedly dilated with air and food stuff which suggest gastric outlet obstruction. Hepatobiliary: Although limited due to the lack of intravenous contrast, normal in appearance without  gross focal abnormality. The patient is status post cholecystectomy. No biliary ductal dilation. Pancreas:  Unremarkable.  No surrounding inflammatory changes. Spleen: Normal in size. Although limited due to the lack of intravenous contrast, normal in appearance. Adrenals/Urinary Tract: Both adrenal glands appear normal. No renal or collecting system calculi. There is left renal atrophy seen. The bladder is unremarkable. Stomach/Bowel: The small bowel and colon are normal in appearance. Scattered colonic diverticula are noted without diverticulitis. Vascular/Lymphatic: There are no enlarged abdominal or pelvic lymph nodes. Aortic Atherosclerosis (ICD10-I70.0). Reproductive: Calcified uterine fibroids are present. Other: No evidence of abdominal wall mass or hernia. Musculoskeletal: No acute or significant osseous findings. IMPRESSION: Large hiatal hernia with gastric volvulus as on prior exam, however now there is a markedly dilated debris-filled stomach which is suggestive of gastric outlet obstruction. Electronically Signed   By: Prudencio Pair M.D.   On: 08/29/2019 03:13   Dg Chest Portable 1 View  Result Date: 08/29/2019 CLINICAL DATA:  Chest pain and shortness of breath EXAM: PORTABLE CHEST 1 VIEW COMPARISON:  08/03/2016 FINDINGS: Cardiac shadow is stable. Aortic calcifications are seen. Loop recorder is noted on the left. Large hiatal hernia is  again seen but increased when compared with the prior study. No focal infiltrate or sizable effusion is seen. No bony abnormality is noted. IMPRESSION: Changes consistent with large hiatal hernia which is increased in size from the prior study. No new focal abnormality is noted. Electronically Signed   By: Inez Catalina M.D.   On: 08/29/2019 00:30        Scheduled Meds: . lidocaine      . latanoprost  1 drop Both Eyes QHS  . levothyroxine  50 mcg Intravenous Daily  . lidocaine  5 mL Mouth/Throat Once  . pantoprazole (PROTONIX) IV  40 mg Intravenous Q24H    Continuous Infusions: . dextrose 5 % and 0.9% NaCl 75 mL/hr at 08/29/19 0539     LOS: 0 days     Vernell Leep, MD, FACP, [facility]. [facility]  To contact the attending provider between 7A-7P or the covering provider during after hours 7P-7A, please log into the web site [email or web address] and access using universal [place] password for that web site. If you do not have the password, please call the hospital operator.  08/29/2019, 11:51 AM

## 2019-08-29 NOTE — Consult Note (Signed)
Alyssa Odonnell May 09, 1934  [id number].    Requesting MD: Dr. Dayna Barker Chief Complaint: Epigastric abdominal pain and nausea  Reason for Consult: Hiatal hernia with gastric outlet obstruction   HPI: Alyssa Odonnell is a 85 y.o. female with a hx of HTN, HLD, Hypothyroidism, CVA, and paroxysmal A. fib on Eliquis (last dose around 8 PM last night) who presented to [facility] yesterday for epigastric abdominal pain and nausea.  Patient reports she was having a vegetarian dinner last night around 8 PM.  Shortly after she began having a constant, dull pain in her epigastrium that radiated into her chest.  She rates the pain as a 8/10.  Patient reports she had associated nausea but denies emesis.  She reports she has never had a pain like this before.  She reports she has not had any flatus since onset.  Last BM was yesterday morning and normal.  Patient presented to the ED where she underwent work-up that was significant for a large hiatal hernia with gastric volvulus.  General surgery was asked to consult.  Medicine has admitted.  Patient reports that she has had a prior cholecystectomy in the past.  She denies any other abdominal surgery but does have listed abdominal hernia repair on her chart review.  She reports she has difficulty remembering certain things about her past given her prior stroke.  She denies any other deficits from her CVA.  Patient lives at home with her daughter who the patient cares for.  She denies any alcohol, tobacco or illicit drug use.  She is retired.  ROS: Review of Systems  Constitutional: Negative for chills and fever.  Respiratory: Positive for cough and shortness of breath.   Cardiovascular: Positive for chest pain. Negative for leg swelling.  Gastrointestinal: Positive for abdominal pain and nausea. Negative for blood in stool, constipation, diarrhea and vomiting.  Genitourinary: Negative for dysuria.  Musculoskeletal: Negative for back pain.  All other systems  reviewed and are negative.   Family History  Problem Relation Age of Onset  . Stroke Father     Past Medical History:  Diagnosis Date  . Glaucoma   . Hyperlipidemia   . Hypertension   . Hypothyroid   . Stroke [facility])     Past Surgical History:  Procedure Laterality Date  . ABDOMINAL HERNIA REPAIR    . LOOP RECORDER INSERTION N/A 03/11/2017   Procedure: Loop Recorder Insertion;  Surgeon: Evans Lance, MD;  Location: [place] [facility];  Service: Cardiovascular;  Laterality: N/A;  . PERIPHERAL VASCULAR CATHETERIZATION Right 07/06/2016   Procedure: Lower Extremity Angiography;  Surgeon: Angelia Mould, MD;  Location: [place] [facility];  Service: Cardiovascular;  Laterality: Right;  . PERIPHERAL VASCULAR CATHETERIZATION Right 07/06/2016   Procedure: Peripheral Vascular Intervention;  Surgeon: Angelia Mould, MD;  Location: [place] [facility];  Service: Cardiovascular;  Laterality: Right;  Superficial femoral  . TEE WITHOUT CARDIOVERSION N/A 03/11/2017   Procedure: TRANSESOPHAGEAL ECHOCARDIOGRAM (TEE);  Surgeon: Acie Fredrickson Wonda Cheng, MD;  Location: [facility] ENDOSCOPY;  Service: Cardiovascular;  Laterality: N/A;  . TOTAL HIP ARTHROPLASTY      Social History:  reports that she has never smoked. She has never used smokeless tobacco. She reports that she does not drink alcohol or use drugs.  Allergies:  Allergies  Allergen Reactions  . Aspirin Other (See Comments)    Red splotches  . Atorvastatin Other (See Comments)    Muscle spams  . Nabumetone Itching  .  Nsaids     Reaction (??)  . Pravastatin Other (See Comments)    Stiff walking difficulty Stiff walking difficulty  . Procaine Swelling  . Rosuvastatin Nausea Only and Other (See Comments)    Reflux and Muscle stiffness  . Simvastatin Nausea Only and Other (See Comments)    Reflux and Muscle stiffness  . Statins Other (See Comments)  . Evolocumab Rash  . Mobic [Meloxicam] Rash  . Penicillins Rash    Has patient had a  PCN reaction causing immediate rash, facial/tongue/throat swelling, SOB or lightheadedness with hypotension: Yes Has patient had a PCN reaction causing severe rash involving mucus membranes or skin necrosis: No Has patient had a PCN reaction that required hospitalization: No Has patient had a PCN reaction occurring within the last 10 years: No If all of the above answers are "NO", then may proceed with Cephalosporin use.      (Not in a hospital admission)    Physical Exam: Blood pressure (!) 162/91, pulse 92, temperature 97.8 F (36.6 C), temperature source Oral, resp. rate (!) 24, SpO2 98 %. General: Elderly, somewhat frail appearing female who is laying in bed in NAD HEENT: Head is normocephalic, atraumatic.  Sclera are noninjected.  PERRL.  Ears and nose without any masses or lesions.  Mask in place Heart: Regular, rate, and rhythm.  Palpable radial and pedal pulses bilaterally Lungs: CTAB, no wheezes, rhonchi, or rales noted.  Respiratory effort non-labored. On 2L Abd: Soft, mild distension, epigastric and LUQ tenderness without rebound, rigidity or guarding. no peritonitis. Hypoactive bowel sounds. Diastasis recti. No masses, or organomegaly. Prior laparoscopic incision as noted below that is well healed MS: Moves all 4 extremities. 1+ LE edema with some chronic venous stasis like changes  Skin: warm and dry with no masses, lesions, or rashes Psych: Alert with an appropriate affect. Neuro: A&Ox3. Moves all extremities      Results for orders placed or performed during the hospital encounter of 08/28/19 (from the past 48 hour(s))  CBC with Differential     Status: Abnormal   Collection Time: 08/29/19 12:04 AM  Result Value Ref Range   WBC 7.4 4.0 - 10.5 K/uL   RBC 3.92 3.87 - 5.11 MIL/uL   Hemoglobin 11.9 (L) 12.0 - 15.0 g/dL   HCT 37.4 36.0 - 46.0 %   MCV 95.4 80.0 - 100.0 fL   MCH 30.4 26.0 - 34.0 pg   MCHC 31.8 30.0 - 36.0 g/dL   RDW 13.1 11.5 - 15.5 %   Platelets 176  150 - 400 K/uL   nRBC 0.0 0.0 - 0.2 %   Neutrophils Relative % 73 %   Neutro Abs 5.4 1.7 - 7.7 K/uL   Lymphocytes Relative 18 %   Lymphs Abs 1.3 0.7 - 4.0 K/uL   Monocytes Relative 7 %   Monocytes Absolute 0.5 0.1 - 1.0 K/uL   Eosinophils Relative 2 %   Eosinophils Absolute 0.1 0.0 - 0.5 K/uL   Basophils Relative 0 %   Basophils Absolute 0.0 0.0 - 0.1 K/uL   Immature Granulocytes 0 %   Abs Immature Granulocytes 0.02 0.00 - 0.07 K/uL    Comment: Performed at [facility], [address] [place], [place] [address]  Comprehensive metabolic panel     Status: Abnormal   Collection Time: 08/29/19 12:04 AM  Result Value Ref Range   Sodium 140 135 - 145 mmol/L   Potassium 3.9 3.5 - 5.1 mmol/L   Chloride 106 98 -  111 mmol/L   CO2 23 22 - 32 mmol/L   Glucose, Bld 130 (H) 70 - 99 mg/dL   BUN 22 8 - 23 mg/dL   Creatinine, Ser 1.65 (H) 0.44 - 1.00 mg/dL   Calcium 9.5 8.9 - 10.3 mg/dL   Total Protein 7.0 6.5 - 8.1 g/dL   Albumin 3.9 3.5 - 5.0 g/dL   AST 16 15 - 41 U/L   ALT 14 0 - 44 U/L   Alkaline Phosphatase 81 38 - 126 U/L   Total Bilirubin 0.6 0.3 - 1.2 mg/dL   GFR calc non Af Amer 28 (L) >60 mL/min   GFR calc Af Amer 33 (L) >60 mL/min   Anion gap 11 5 - 15    Comment: Performed at [facility] [address], [place], [place] [address]  Lipase, blood     Status: None   Collection Time: 08/29/19 12:04 AM  Result Value Ref Range   Lipase 30 11 - 51 U/L    Comment: Performed at [facility], [place] [address], [place], [place] [address]  Troponin I (High Sensitivity)     Status: None   Collection Time: 08/29/19 12:04 AM  Result Value Ref Range   Troponin I (High Sensitivity) 6 <18 ng/L    Comment: (NOTE) Elevated high sensitivity troponin I (hsTnI) values and significant  changes across serial measurements may suggest ACS but many other  chronic and acute conditions are known to elevate hsTnI results.  Refer to the "Links" section for chest pain algorithms and  additional  guidance. Performed at [facility], [place] [address], [place], [place] [address]   Troponin I (High Sensitivity)     Status: None   Collection Time: 08/29/19  4:09 AM  Result Value Ref Range   Troponin I (High Sensitivity) 8 <18 ng/L    Comment: (NOTE) Elevated high sensitivity troponin I (hsTnI) values and significant  changes across serial measurements may suggest ACS but many other  chronic and acute conditions are known to elevate hsTnI results.  Refer to the "Links" section for chest pain algorithms and additional  guidance. Performed at [facility], [place] [address] [place],  [address]    Ct Abdomen Pelvis Wo Contrast  Result Date: 08/29/2019 CLINICAL DATA:  Abdominal pain, acute EXAM: CT ABDOMEN AND PELVIS WITHOUT CONTRAST TECHNIQUE: Multidetector CT imaging of the abdomen and pelvis was performed following the standard protocol without IV contrast. COMPARISON:  August 03, 2016 FINDINGS: Lower chest: The visualized heart size within normal limits. No pericardial fluid/thickening. Again noted is a large hiatal hernia with almost the entirety of the stomach rotated and flipped upward is, consistent with a gastric volvulus. The portion of the herniated stomach is markedly dilated with air and food stuff which suggest gastric outlet obstruction. Hepatobiliary: Although limited due to the lack of intravenous contrast, normal in appearance without gross focal abnormality. The patient is status post cholecystectomy. No biliary ductal dilation. Pancreas:  Unremarkable.  No surrounding inflammatory changes. Spleen: Normal in size. Although limited due to the lack of intravenous contrast, normal in appearance. Adrenals/Urinary Tract: Both adrenal glands appear normal. No renal or collecting system calculi. There is left renal atrophy seen. The bladder is unremarkable. Stomach/Bowel: The small bowel and colon are normal in appearance. Scattered colonic diverticula are  noted without diverticulitis. Vascular/Lymphatic: There are no enlarged abdominal or pelvic lymph nodes. Aortic Atherosclerosis (ICD10-I70.0). Reproductive: Calcified uterine fibroids are present. Other: No evidence  of abdominal wall mass or hernia. Musculoskeletal: No acute or significant osseous findings. IMPRESSION: Large hiatal hernia with gastric volvulus as on prior exam, however now there is a markedly dilated debris-filled stomach which is suggestive of gastric outlet obstruction. Electronically Signed   By: Prudencio Pair M.D.   On: 08/29/2019 03:13   Dg Chest Portable 1 View  Result Date: 08/29/2019 CLINICAL DATA:  Chest pain and shortness of breath EXAM: PORTABLE CHEST 1 VIEW COMPARISON:  08/03/2016 FINDINGS: Cardiac shadow is stable. Aortic calcifications are seen. Loop recorder is noted on the left. Large hiatal hernia is again seen but increased when compared with the prior study. No focal infiltrate or sizable effusion is seen. No bony abnormality is noted. IMPRESSION: Changes consistent with large hiatal hernia which is increased in size from the prior study. No new focal abnormality is noted. Electronically Signed   By: Inez Catalina M.D.   On: 08/29/2019 00:30   Anti-infectives (From admission, onward)   None      Assessment/Plan Hx CVA Hypothyroidism HTN HLD Paroxysmal A Fib on Elquis - Please hold. Okay for Heparin  - Above per TRH -  Hiatal Hernia with Gastric Outlet Obstruction -CT scan reviewed personally.  Labs reviewed without leukocytosis.  Patient's exam without peritonitis.  Vital signs have been reassuring since arrival without fever, tachycardia or hypotension.  Patient is currently stable.  Given she is on Eliquis, will treat with NG tube and bowel rest currently.  Will plan for diagnostic laparoscopic versus open gastropexy on Thursday.   FEN - NPO, NGT, IVF VTE - SCDs, Hold Elqiuis, okay for Heparin  ID - None  Jillyn Ledger, [facility] Surgery  08/29/2019, 7:49 AM Please see Amion for pager number during day hours 7:00am-4:30pm

## 2019-08-29 NOTE — ED Notes (Signed)
Tele- have pt call daughter, [name], when pt is available  Cell- [phone] landline- [phone]

## 2019-08-29 NOTE — ED Notes (Addendum)
ED TO INPATIENT HANDOFF REPORT  ED Nurse Name and Phone #: Thurmond Butts [place] Name/Age/Gender Alyssa Odonnell 85 y.o. female Room/Bed: 013C/013C  Code Status   Code Status: Full Code  Home/SNF/Other Home Patient oriented to: self, place, time and situation Is this baseline? Yes   Triage Complete: Triage complete  Chief Complaint chest pain  Triage Note Pt BIB [facility] from home, c/o nausea, chest pain, and shortness of breath that started after eating tonight. Pain is non-radiating. Pt given 4NTG by EMS with little relief. Hx afib, taking xarelto. Pt refused asa, states she has an allergy.   Pt tested for COVID after a close family contact came back positive, pt denies symptoms.   Allergies Allergies  Allergen Reactions  . Aspirin Other (See Comments)    Red splotches  . Atorvastatin Other (See Comments)    Muscle spams  . Nabumetone Itching  . Nsaids     Reaction (??)  . Pravastatin Other (See Comments)    Stiff walking difficulty Stiff walking difficulty  . Procaine Swelling  . Rosuvastatin Nausea Only and Other (See Comments)    Reflux and Muscle stiffness  . Simvastatin Nausea Only and Other (See Comments)    Reflux and Muscle stiffness  . Statins Other (See Comments)  . Evolocumab Rash  . Mobic [Meloxicam] Rash  . Penicillins Rash    Has patient had a PCN reaction causing immediate rash, facial/tongue/throat swelling, SOB or lightheadedness with hypotension: Yes Has patient had a PCN reaction causing severe rash involving mucus membranes or skin necrosis: No Has patient had a PCN reaction that required hospitalization: No Has patient had a PCN reaction occurring within the last 10 years: No If all of the above answers are "NO", then may proceed with Cephalosporin use.      Level of Care/Admitting Diagnosis ED Disposition    ED Disposition Condition [facility] Area: [place] [[id number]]  Level of Care: Telemetry Medical  [104]  Covid Evaluation: Asymptomatic Screening Protocol (No Symptoms)  Diagnosis: Gastric outlet obstruction [id number]  Admitting Physician: Rise Patience [phone]  Attending Physician: Rise Patience [phone]  Estimated length of stay: past midnight tomorrow  Certification:: I certify this patient will need inpatient services for at least 2 midnights  PT Class (Do Not Modify): Inpatient [101]  PT Acc Code (Do Not Modify): Private [1]       B Medical/Surgery History Past Medical History:  Diagnosis Date  . Glaucoma   . Hyperlipidemia   . Hypertension   . Hypothyroid   . Stroke [facility])    Past Surgical History:  Procedure Laterality Date  . ABDOMINAL HERNIA REPAIR    . LOOP RECORDER INSERTION N/A 03/11/2017   Procedure: Loop Recorder Insertion;  Surgeon: Evans Lance, MD;  Location: [place] [facility];  Service: Cardiovascular;  Laterality: N/A;  . PERIPHERAL VASCULAR CATHETERIZATION Right 07/06/2016   Procedure: Lower Extremity Angiography;  Surgeon: Angelia Mould, MD;  Location: [place] [facility];  Service: Cardiovascular;  Laterality: Right;  . PERIPHERAL VASCULAR CATHETERIZATION Right 07/06/2016   Procedure: Peripheral Vascular Intervention;  Surgeon: Angelia Mould, MD;  Location: [place] [facility];  Service: Cardiovascular;  Laterality: Right;  Superficial femoral  . TEE WITHOUT CARDIOVERSION N/A 03/11/2017   Procedure: TRANSESOPHAGEAL ECHOCARDIOGRAM (TEE);  Surgeon: Acie Fredrickson Wonda Cheng, MD;  Location: [place];  Service: Cardiovascular;  Laterality: N/A;  . TOTAL HIP ARTHROPLASTY  A IV Location/Drains/Wounds Patient Lines/Drains/Airways Status   Active Line/Drains/Airways    Name:   Placement date:   Placement time:   Site:   Days:   Peripheral IV 08/28/19 Left Antecubital   08/28/19    2340    Antecubital   1   NG/OG Tube Nasogastric 12 Fr. Left nare Xray   08/29/19    1230    Left nare   less than 1          Intake/Output Last 24  hours  Intake/Output Summary (Last 24 hours) at 08/29/2019 1426 Last data filed at 08/29/2019 0534 Gross per 24 hour  Intake 1000 ml  Output -  Net 1000 ml    Labs/Imaging Results for orders placed or performed during the hospital encounter of 08/28/19 (from the past 48 hour(s))  CBC with Differential     Status: Abnormal   Collection Time: 08/29/19 12:04 AM  Result Value Ref Range   WBC 7.4 4.0 - 10.5 K/uL   RBC 3.92 3.87 - 5.11 MIL/uL   Hemoglobin 11.9 (L) 12.0 - 15.0 g/dL   HCT 37.4 36.0 - 46.0 %   MCV 95.4 80.0 - 100.0 fL   MCH 30.4 26.0 - 34.0 pg   MCHC 31.8 30.0 - 36.0 g/dL   RDW 13.1 11.5 - 15.5 %   Platelets 176 150 - 400 K/uL   nRBC 0.0 0.0 - 0.2 %   Neutrophils Relative % 73 %   Neutro Abs 5.4 1.7 - 7.7 K/uL   Lymphocytes Relative 18 %   Lymphs Abs 1.3 0.7 - 4.0 K/uL   Monocytes Relative 7 %   Monocytes Absolute 0.5 0.1 - 1.0 K/uL   Eosinophils Relative 2 %   Eosinophils Absolute 0.1 0.0 - 0.5 K/uL   Basophils Relative 0 %   Basophils Absolute 0.0 0.0 - 0.1 K/uL   Immature Granulocytes 0 %   Abs Immature Granulocytes 0.02 0.00 - 0.07 K/uL    Comment: Performed at [facility], [address] [place], [place] [address]  Comprehensive metabolic panel     Status: Abnormal   Collection Time: 08/29/19 12:04 AM  Result Value Ref Range   Sodium 140 135 - 145 mmol/L   Potassium 3.9 3.5 - 5.1 mmol/L   Chloride 106 98 - 111 mmol/L   CO2 23 22 - 32 mmol/L   Glucose, Bld 130 (H) 70 - 99 mg/dL   BUN 22 8 - 23 mg/dL   Creatinine, Ser 1.65 (H) 0.44 - 1.00 mg/dL   Calcium 9.5 8.9 - 10.3 mg/dL   Total Protein 7.0 6.5 - 8.1 g/dL   Albumin 3.9 3.5 - 5.0 g/dL   AST 16 15 - 41 U/L   ALT 14 0 - 44 U/L   Alkaline Phosphatase 81 38 - 126 U/L   Total Bilirubin 0.6 0.3 - 1.2 mg/dL   GFR calc non Af Amer 28 (L) >60 mL/min   GFR calc Af Amer 33 (L) >60 mL/min   Anion gap 11 5 - 15    Comment: Performed at [facility] [address], [place], [place] [address]   Lipase, blood     Status: None   Collection Time: 08/29/19 12:04 AM  Result Value Ref Range   Lipase 30 11 - 51 U/L    Comment: Performed at [facility], [place] [address], [place], [place] [address]  Troponin I (High Sensitivity)     Status: None  Collection Time: 08/29/19 12:04 AM  Result Value Ref Range   Troponin I (High Sensitivity) 6 <18 ng/L    Comment: (NOTE) Elevated high sensitivity troponin I (hsTnI) values and significant  changes across serial measurements may suggest ACS but many other  chronic and acute conditions are known to elevate hsTnI results.  Refer to the "Links" section for chest pain algorithms and additional  guidance. Performed at [facility], [place] [address], [place], [place] [address]   Troponin I (High Sensitivity)     Status: None   Collection Time: 08/29/19  4:09 AM  Result Value Ref Range   Troponin I (High Sensitivity) 8 <18 ng/L    Comment: (NOTE) Elevated high sensitivity troponin I (hsTnI) values and significant  changes across serial measurements may suggest ACS but many other  chronic and acute conditions are known to elevate hsTnI results.  Refer to the "Links" section for chest pain algorithms and additional  guidance. Performed at [facility], [place] [address], [place], [place] [address]   SARS CORONAVIRUS 2 (TAT 6-24 HRS) Nasopharyngeal Nasopharyngeal Swab     Status: None   Collection Time: 08/29/19  5:30 AM   Specimen: Nasopharyngeal Swab  Result Value Ref Range   SARS Coronavirus 2 NEGATIVE NEGATIVE    Comment: (NOTE) SARS-CoV-2 target nucleic acids are NOT DETECTED. The SARS-CoV-2 RNA is generally detectable in upper and lower respiratory specimens during the acute phase of infection. Negative results do not preclude SARS-CoV-2 infection, do not rule out co-infections with other pathogens, and should not be used as the sole basis for treatment or other patient management decisions. Negative results must be  combined with clinical observations, patient history, and epidemiological information. The expected result is Negative. Fact Sheet for Patients: SugarRoll.be Fact Sheet for Healthcare Providers: https://www.woods-mathews.com/ This test is not yet approved or cleared by the [place] FDA and  has been authorized for detection and/or diagnosis of SARS-CoV-2 by FDA under an Emergency Use Authorization (EUA). This EUA will remain  in effect (meaning this test can be used) for the duration of the COVID-19 declaration under Section 56 4(b)(1) of the Act, 21 U.S.C. section 360bbb-3(b)(1), unless the authorization is terminated or revoked sooner. Performed at [facility], [place] [address] [place], [place] [id number]   Basic metabolic panel     Status: Abnormal   Collection Time: 08/29/19  7:38 AM  Result Value Ref Range   Sodium 139 135 - 145 mmol/L   Potassium 4.0 3.5 - 5.1 mmol/L   Chloride 104 98 - 111 mmol/L   CO2 24 22 - 32 mmol/L   Glucose, Bld 159 (H) 70 - 99 mg/dL   BUN 17 8 - 23 mg/dL   Creatinine, Ser 1.44 (H) 0.44 - 1.00 mg/dL   Calcium 9.2 8.9 - 10.3 mg/dL   GFR calc non Af Amer 33 (L) >60 mL/min   GFR calc Af Amer 39 (L) >60 mL/min   Anion gap 11 5 - 15    Comment: Performed at [facility], [place] [address] [place] [address]  CBC     Status: Abnormal   Collection Time: 08/29/19  7:38 AM  Result Value Ref Range   WBC 8.8 4.0 - 10.5 K/uL   RBC 3.80 (L) 3.87 - 5.11 MIL/uL   Hemoglobin 11.6 (L) 12.0 - 15.0 g/dL   HCT 36.9 36.0 - 46.0 %   MCV 97.1 80.0 - 100.0 fL   MCH  30.5 26.0 - 34.0 pg   MCHC 31.4 30.0 - 36.0 g/dL   RDW 13.0 11.5 - 15.5 %   Platelets 172 150 - 400 K/uL   nRBC 0.0 0.0 - 0.2 %    Comment: Performed at [facility], [place] [address] [place], [place] [address]  Heparin level (unfractionated)     Status: None   Collection Time: 08/29/19 12:59 PM  Result Value Ref Range   Heparin  Unfractionated 0.62 0.30 - 0.70 IU/mL    Comment: (NOTE) If heparin results are below expected values, and patient dosage has  been confirmed, suggest follow up testing of antithrombin III levels. Performed at [facility], [place] [address], [place], [place] [address]    Ct Abdomen Pelvis Wo Contrast  Result Date: 08/29/2019 CLINICAL DATA:  Abdominal pain, acute EXAM: CT ABDOMEN AND PELVIS WITHOUT CONTRAST TECHNIQUE: Multidetector CT imaging of the abdomen and pelvis was performed following the standard protocol without IV contrast. COMPARISON:  August 03, 2016 FINDINGS: Lower chest: The visualized heart size within normal limits. No pericardial fluid/thickening. Again noted is a large hiatal hernia with almost the entirety of the stomach rotated and flipped upward is, consistent with a gastric volvulus. The portion of the herniated stomach is markedly dilated with air and food stuff which suggest gastric outlet obstruction. Hepatobiliary: Although limited due to the lack of intravenous contrast, normal in appearance without gross focal abnormality. The patient is status post cholecystectomy. No biliary ductal dilation. Pancreas:  Unremarkable.  No surrounding inflammatory changes. Spleen: Normal in size. Although limited due to the lack of intravenous contrast, normal in appearance. Adrenals/Urinary Tract: Both adrenal glands appear normal. No renal or collecting system calculi. There is left renal atrophy seen. The bladder is unremarkable. Stomach/Bowel: The small bowel and colon are normal in appearance. Scattered colonic diverticula are noted without diverticulitis. Vascular/Lymphatic: There are no enlarged abdominal or pelvic lymph nodes. Aortic Atherosclerosis (ICD10-I70.0). Reproductive: Calcified uterine fibroids are present. Other: No evidence of abdominal wall mass or hernia. Musculoskeletal: No acute or significant osseous findings. IMPRESSION: Large hiatal hernia with gastric volvulus as  on prior exam, however now there is a markedly dilated debris-filled stomach which is suggestive of gastric outlet obstruction. Electronically Signed   By: Prudencio Pair M.D.   On: 08/29/2019 03:13   Dg Abd 1 View  Result Date: 08/29/2019 CLINICAL DATA:  NG tube placement. EXAM: ABDOMEN - 1 VIEW; DG NASO G TUBE PLC W/FL-NO RAD COMPARISON:  CT 08/29/2019. FINDINGS: NG tube noted with tip coiled in the large hiatal hernia. Cardiac monitor noted chest. Cardiac leads and tubing noted over the chest. IMPRESSION: NG tube noted with its tip coiled in the patient's known large hiatal hernia. Electronically Signed   By: Marcello Moores  Register   On: 08/29/2019 12:59   Dg Chest Portable 1 View  Result Date: 08/29/2019 CLINICAL DATA:  Chest pain and shortness of breath EXAM: PORTABLE CHEST 1 VIEW COMPARISON:  08/03/2016 FINDINGS: Cardiac shadow is stable. Aortic calcifications are seen. Loop recorder is noted on the left. Large hiatal hernia is again seen but increased when compared with the prior study. No focal infiltrate or sizable effusion is seen. No bony abnormality is noted. IMPRESSION: Changes consistent with large hiatal hernia which is increased in size from the prior study. No new focal abnormality is noted. Electronically Signed   By: Inez Catalina M.D.   On: 08/29/2019 00:30   Dg Naso G Tube Plc W/fl-no Rad  Result  Date: 08/29/2019 CLINICAL DATA:  NG tube placement. EXAM: ABDOMEN - 1 VIEW; DG NASO G TUBE PLC W/FL-NO RAD COMPARISON:  CT 08/29/2019. FINDINGS: NG tube noted with tip coiled in the large hiatal hernia. Cardiac monitor noted chest. Cardiac leads and tubing noted over the chest. IMPRESSION: NG tube noted with its tip coiled in the patient's known large hiatal hernia. Electronically Signed   By: Marcello Moores  Register   On: 08/29/2019 12:59    Pending Labs Unresulted Labs (From admission, onward)    Start     Ordered   08/30/19 0500  CBC  Tomorrow morning,   R     08/29/19 1219   08/30/19 [id number]   Basic metabolic panel  Tomorrow morning,   R     08/29/19 1219   08/30/19 0500  Heparin level (unfractionated)  Daily,   R     08/29/19 1259   08/30/19 0500  APTT  Daily,   R     08/29/19 1259   08/30/19 0500  CBC  Daily,   R     08/29/19 1259   08/29/19 2200  APTT  Once-Timed,   STAT     08/29/19 1309   08/29/19 1259  APTT  ONCE - STAT,   STAT     08/29/19 1259          Vitals/Pain Today's Vitals   08/29/19 1215 08/29/19 1230 08/29/19 1245 08/29/19 1300  BP: (!) 147/85 (!) 154/87 (!) 141/73 (!) 146/80  Pulse: 85 87 85 87  Resp: 20 (!) 22 19 17   Temp:      TempSrc:      SpO2: 96% (!) 88% 100% 100%  Weight:    83.5 kg  Height:    5\' 3"  (1.6 m)  PainSc:        Isolation Precautions No active isolations  Medications Medications  latanoprost (XALATAN) 0.005 % ophthalmic solution 1 drop (has no administration in time range)  acetaminophen (TYLENOL) tablet 650 mg (has no administration in time range)    Or  acetaminophen (TYLENOL) suppository 650 mg (has no administration in time range)  ondansetron (ZOFRAN) tablet 4 mg ( Oral See Alternative 08/29/19 0712)    Or  ondansetron (ZOFRAN) injection 4 mg (4 mg Intravenous Given 08/29/19 0712)  dextrose 5 %-0.9 % sodium chloride infusion ( Intravenous New Bag/Given 08/29/19 0539)  pantoprazole (PROTONIX) injection 40 mg (40 mg Intravenous Given 08/29/19 0534)  hydrALAZINE (APRESOLINE) injection 10 mg (has no administration in time range)  morphine 2 MG/ML injection 2 mg (2 mg Intravenous Given 08/29/19 1019)  promethazine (PHENERGAN) injection 12.5 mg (12.5 mg Intravenous Given 08/29/19 1018)  levothyroxine (SYNTHROID, LEVOTHROID) injection 50 mcg (50 mcg Intravenous Given 08/29/19 1110)  heparin ADULT infusion 100 units/mL (25000 units/26mL sodium chloride 0.45%) (1,050 Units/hr Intravenous New Bag/Given 08/29/19 1333)  alum & mag hydroxide-simeth (MAALOX/MYLANTA) 200-200-20 MG/5ML suspension 30 mL (30 mLs Oral Given 08/29/19  0032)  lidocaine (XYLOCAINE) 2 % viscous mouth solution 15 mL (15 mLs Mouth/Throat Given 08/29/19 0032)  famotidine (PEPCID) tablet 20 mg (20 mg Oral Given 08/29/19 0032)  ondansetron (ZOFRAN) injection 4 mg (4 mg Intravenous Given 08/29/19 0030)  fentaNYL (SUBLIMAZE) injection 50 mcg (50 mcg Intravenous Given 08/29/19 0030)  lactated ringers bolus 500 mL (0 mLs Intravenous Stopped 08/29/19 0135)  fentaNYL (SUBLIMAZE) injection 50 mcg (50 mcg Intravenous Given 08/29/19 0201)  metoCLOPramide (REGLAN) injection 10 mg (10 mg Intravenous Given 08/29/19 0201)  lactated ringers bolus 1,000 mL (0 mLs Intravenous  Stopped 08/29/19 0534)  fentaNYL (SUBLIMAZE) injection 50 mcg (50 mcg Intravenous Given 08/29/19 0408)  ondansetron (ZOFRAN) injection 4 mg (4 mg Intravenous Given 08/29/19 0407)  lidocaine (XYLOCAINE) 2 % viscous mouth solution 5 mL (7 mLs Mouth/Throat Given 08/29/19 1200)    Mobility walks Low fall risk   Focused Assessments Pulmonary Assessment Handoff:  Lung sounds:   O2 Device: Room Air        R Recommendations: See Admitting Provider Note  Report given to: Angela Nevin, RN  Additional Notes:

## 2019-08-29 NOTE — ED Notes (Signed)
Pt vomited medications after PO mag ox; unable to drink viscous lidocaine

## 2019-08-29 NOTE — Progress Notes (Signed)
[place] for heparin Indication: atrial fibrillation  Heparin Dosing Weight: 70.9 kg  Labs: Recent Labs    08/29/19 0004 08/29/19 0738  HGB 11.9* 11.6*  HCT 37.4 36.9  PLT 176 172  CREATININE 1.65* 1.44*    Assessment: 23 yof with hx of PAF on apixaban PTA (CHADS2VASC 7) presenting with chest/abdominal pain, vomiting with CT abdomen showing large hiatal hernia with gastric volvulus with gastric outlet obstruction. General Surgery tentatively planning OR on 11/12 if no improvement with medical management. Pharmacy consulted to transition to heparin. Hg 11.6, plt wnl on admit. No active bleed issues documented. Last dose of apixaban documented 11/9 at 2000 PTA - ok to start heparin now as has been >12hrs since last apixaban dose. Will monitor aPTTs with apixaban expected to influence heparin levels.   Goal of Therapy:  Heparin level 0.3-0.7 units/ml aPTT 66-102 seconds Monitor platelets by anticoagulation protocol: Yes   Plan:  Baseline aPTT/heparin level bolus. Start heparin at 1050 units/hr 8h aPTT Monitor daily heparin level/aPTT/CBC, s/sx bleeding   Elicia Lamp, PharmD, BCPS Clinical Pharmacist 08/29/2019 12:53 PM

## 2019-08-30 DIAGNOSIS — I480 Paroxysmal atrial fibrillation: Secondary | ICD-10-CM

## 2019-08-30 DIAGNOSIS — I10 Essential (primary) hypertension: Secondary | ICD-10-CM

## 2019-08-30 DIAGNOSIS — K3189 Other diseases of stomach and duodenum: Secondary | ICD-10-CM

## 2019-08-30 LAB — APTT
aPTT: 75 seconds — ABNORMAL HIGH (ref 24–36)
aPTT: 65 seconds — ABNORMAL HIGH (ref 24–36)

## 2019-08-30 LAB — CBC
HCT: 41.2 % (ref 36.0–46.0)
Hemoglobin: 13.1 g/dL (ref 12.0–15.0)
MCH: 30.5 pg (ref 26.0–34.0)
MCHC: 31.8 g/dL (ref 30.0–36.0)
MCV: 96 fL (ref 80.0–100.0)
Platelets: 162 10*3/uL (ref 150–400)
RBC: 4.29 MIL/uL (ref 3.87–5.11)
RDW: 13.1 % (ref 11.5–15.5)
WBC: 10.7 10*3/uL — ABNORMAL HIGH (ref 4.0–10.5)
nRBC: 0 % (ref 0.0–0.2)

## 2019-08-30 LAB — HEPARIN LEVEL (UNFRACTIONATED)
Heparin Unfractionated: 1.03 IU/mL — ABNORMAL HIGH (ref 0.30–0.70)
Heparin Unfractionated: 0.85 IU/mL — ABNORMAL HIGH (ref 0.30–0.70)

## 2019-08-30 LAB — GLUCOSE, CAPILLARY
Glucose-Capillary: 128 mg/dL — ABNORMAL HIGH (ref 70–99)
Glucose-Capillary: 133 mg/dL — ABNORMAL HIGH (ref 70–99)

## 2019-08-30 LAB — BASIC METABOLIC PANEL
Anion gap: 13 (ref 5–15)
BUN: 13 mg/dL (ref 8–23)
CO2: 21 mmol/L — ABNORMAL LOW (ref 22–32)
Calcium: 9 mg/dL (ref 8.9–10.3)
Chloride: 102 mmol/L (ref 98–111)
Creatinine, Ser: 1.23 mg/dL — ABNORMAL HIGH (ref 0.44–1.00)
GFR calc Af Amer: 47 mL/min — ABNORMAL LOW (ref >60)
GFR calc non Af Amer: 41 mL/min — ABNORMAL LOW (ref >60)
Glucose, Bld: 130 mg/dL — ABNORMAL HIGH (ref 70–99)
Potassium: 4 mmol/L (ref 3.5–5.1)
Sodium: 136 mmol/L (ref 135–145)

## 2019-08-30 MED ORDER — PANTOPRAZOLE SODIUM 40 MG IV SOLR
40.0000 mg | INTRAVENOUS | Status: DC
  Administered 2019-08-31 – 2019-09-07 (×8): 40 mg via INTRAVENOUS
  Filled 2019-08-30 (×8): qty 40

## 2019-08-30 MED ORDER — CHLORHEXIDINE GLUCONATE 0.12 % MT SOLN
15.0000 mL | Freq: Two times a day (BID) | OROMUCOSAL | Status: DC
  Administered 2019-08-30 – 2019-09-23 (×45): 15 mL via OROMUCOSAL
  Filled 2019-08-30 (×46): qty 15

## 2019-08-30 MED ORDER — DILTIAZEM LOAD VIA INFUSION
10.0000 mg | Freq: Once | INTRAVENOUS | Status: AC
  Administered 2019-08-30: 10 mg via INTRAVENOUS
  Filled 2019-08-30: qty 10

## 2019-08-30 MED ORDER — DEXTROSE-NACL 5-0.9 % IV SOLN
INTRAVENOUS | Status: AC
  Administered 2019-08-30 – 2019-08-31 (×2): via INTRAVENOUS

## 2019-08-30 MED ORDER — ORAL CARE MOUTH RINSE
15.0000 mL | Freq: Two times a day (BID) | OROMUCOSAL | Status: DC
  Administered 2019-08-30 – 2019-09-24 (×35): 15 mL via OROMUCOSAL

## 2019-08-30 MED ORDER — DILTIAZEM HCL-DEXTROSE 125-5 MG/125ML-% IV SOLN (PREMIX)
5.0000 mg/h | INTRAVENOUS | Status: DC
  Administered 2019-08-30: 10 mg/h via INTRAVENOUS
  Administered 2019-08-30: 5 mg/h via INTRAVENOUS
  Administered 2019-08-31: 10 mg/h via INTRAVENOUS
  Filled 2019-08-30 (×4): qty 125

## 2019-08-30 MED ORDER — METHOCARBAMOL 1000 MG/10ML IJ SOLN
500.0000 mg | Freq: Four times a day (QID) | INTRAVENOUS | Status: DC | PRN
  Filled 2019-08-30: qty 5

## 2019-08-30 MED ORDER — DILTIAZEM HCL-DEXTROSE 125-5 MG/125ML-% IV SOLN (PREMIX)
5.0000 mg/h | INTRAVENOUS | Status: DC
  Filled 2019-08-30: qty 125

## 2019-08-30 NOTE — Progress Notes (Signed)
[place] for heparin Indication: atrial fibrillation  Heparin Dosing Weight: 70.9 kg  Labs: Recent Labs    08/29/19 0004 08/29/19 0738 08/29/19 1259 08/29/19 1710 08/29/19 2137  HGB 11.9* 11.6*  --   --   --   HCT 37.4 36.9  --   --   --   PLT 176 172  --   --   --   APTT  --   --   --  42* 46*  HEPARINUNFRC  --   --  0.62  --   --   CREATININE 1.65* 1.44*  --   --   --     Assessment: 85 y.o. female with AFib, ELiquis on hold, for heparin  Goal of Therapy:  Heparin level 0.3-0.7 units/ml aPTT 66-102 seconds Monitor platelets by anticoagulation protocol: Yes   Plan:  Increase Heparin 1200 units/hr Follow-up am labs.  Phillis Knack, PharmD, BCPS  08/30/2019 12:39 AM

## 2019-08-30 NOTE — Progress Notes (Signed)
PROGRESS NOTE  Alyssa Odonnell [id number] DOB: Dec 19, 1933 DOA: 08/28/2019 PCP: Drake Leach, MD  HPI/Recap of past 24 hours: HPI from [name] 85 year old female, lives with her daughter, ambulates with the help of a walker, PMH of PAF on Eliquis, embolic CVA, HTN, HLD, hypothyroid, PAD, cholecystectomy, presented to [facility] ED on 08/28/2019 due to abrupt onset of severe nausea, dry heaving without vomiting, chest/epigastric abdominal pain.  CT abdomen showed large hiatal hernia with gastric volvulus with gastric outlet obstruction.  Multiple unsuccessful attempts to pass NG tube at bedside.  IR consulted for NG tube under fluoroscopy.  General surgery consulted and have tentatively plan for OR on 11/12, if no improvement with medical management.    Early this a.m., patient noted to go into A. fib with RVR, denied any worsening symptoms, denies any palpitations, chest pain, shortness of breath, worsening abdominal pain, nausea/vomiting, fever/chills.  No flatus/BM.   Assessment/Plan: Principal Problem:   Volvulus of stomach Active Problems:   PAD (peripheral artery disease) (HCC)   Essential hypertension   PAF (paroxysmal atrial fibrillation) (HCC)   Gastric outlet obstruction  Large hiatal hernia with gastric outlet obstruction CT abdomen/pelvis on 11/10 showed above Status post NG tube, bowel rest, IV fluids, pain management General surgery on board, plan for surgery possibly 11/12 if no improvement medically Patient cleared by cardiology for surgery  Paroxysmal A. fib with RVR Heart rate noted to be in the 140s EKG showed A. fib TTE on 02/2017 showed LVEF of 60 to 65% Patient started on diltiazem drip Continue IV Heparin Hold home Eliquis Cardiology consulted, cleared patient for surgery, continue diltiazem drip, Heparin and monitor closely  Hypertension BP soft Continue diltiazem drip for A. Fib Hold home Benicar, hydrochlorothiazide Monitor closely  Hypothyroidism  Continue IV Synthroid  CKD stage IIIb Creatinine at baseline Daily BMP  Obesity Lifestyle modification advised          Malnutrition Type:      Malnutrition Characteristics:      Nutrition Interventions:       Estimated body mass index is 32.61 kg/m as calculated from the following:   Height as of this encounter: 5\' 3"  (1.6 m).   Weight as of this encounter: 83.5 kg.     Code Status: Full  Family Communication: None at bedside  Disposition Plan: To be determined   Consultants:  General surgery  Cardiology  Procedures:  None  Antimicrobials:  None  DVT prophylaxis: IV Heparin   Objective: Vitals:   08/30/19 1232 08/30/19 1319 08/30/19 1330 08/30/19 1410  BP: 103/66 99/62 (!) 127/57 116/70  Pulse: 95 93 70 96  Resp:   18 (!) 22  Temp:   98.7 F (37.1 C)   TempSrc:   Oral   SpO2:   96% 97%  Weight:      Height:        Intake/Output Summary (Last 24 hours) at 08/30/2019 1448 Last data filed at 08/30/2019 1400 Gross per 24 hour  Intake 1343.01 ml  Output 1550 ml  Net -206.99 ml   Filed Weights   08/29/19 1300  Weight: 83.5 kg    Exam:  General: NAD, acutely ill-appearing  Cardiovascular: S1, S2 present  Respiratory: CTAB  Abdomen: Soft, mildly tender, mildly distended, bowel sounds present  Musculoskeletal: Trace bilateral pedal edema noted  Skin: Normal  Psychiatry: Normal mood   Data Reviewed: CBC: Recent Labs  Lab 08/29/19 0004 08/29/19 0738 08/30/19 0744  WBC 7.4 8.8 10.7*  NEUTROABS 5.4  --   --  HGB 11.9* 11.6* 13.1  HCT 37.4 36.9 41.2  MCV 95.4 97.1 96.0  PLT 176 172 0000000   Basic Metabolic Panel: Recent Labs  Lab 08/29/19 0004 08/29/19 0738 08/30/19 0744  NA 140 139 136  K 3.9 4.0 4.0  CL 106 104 102  CO2 23 24 21*  GLUCOSE 130* 159* 130*  BUN 22 17 13   CREATININE 1.65* 1.44* 1.23*  CALCIUM 9.5 9.2 9.0   GFR: Estimated Creatinine Clearance: 35.5 mL/min (A) (by C-G formula based on  SCr of 1.23 mg/dL (H)). Liver Function Tests: Recent Labs  Lab 08/29/19 0004  AST 16  ALT 14  ALKPHOS 81  BILITOT 0.6  PROT 7.0  ALBUMIN 3.9   Recent Labs  Lab 08/29/19 0004  LIPASE 30   No results for input(s): AMMONIA in the last 168 hours. Coagulation Profile: No results for input(s): INR, PROTIME in the last 168 hours. Cardiac Enzymes: No results for input(s): CKTOTAL, CKMB, CKMBINDEX, TROPONINI in the last 168 hours. BNP (last 3 results) No results for input(s): PROBNP in the last 8760 hours. HbA1C: No results for input(s): HGBA1C in the last 72 hours. CBG: Recent Labs  Lab 08/29/19 1705 08/29/19 2343 08/30/19 0753 08/30/19 1133  GLUCAP 111* 123* 128* 133*   Lipid Profile: No results for input(s): CHOL, HDL, LDLCALC, TRIG, CHOLHDL, LDLDIRECT in the last 72 hours. Thyroid Function Tests: No results for input(s): TSH, T4TOTAL, FREET4, T3FREE, THYROIDAB in the last 72 hours. Anemia Panel: No results for input(s): VITAMINB12, FOLATE, FERRITIN, TIBC, IRON, RETICCTPCT in the last 72 hours. Urine analysis: No results found for: COLORURINE, APPEARANCEUR, LABSPEC, PHURINE, GLUCOSEU, HGBUR, BILIRUBINUR, KETONESUR, PROTEINUR, UROBILINOGEN, NITRITE, LEUKOCYTESUR Sepsis Labs: @LABRCNTIP (procalcitonin:4,lacticidven:4)  ) Recent Results (from the past 240 hour(s))  SARS CORONAVIRUS 2 (TAT 6-24 HRS) Nasopharyngeal Nasopharyngeal Swab     Status: None   Collection Time: 08/29/19  5:30 AM   Specimen: Nasopharyngeal Swab  Result Value Ref Range Status   SARS Coronavirus 2 NEGATIVE NEGATIVE Final    Comment: (NOTE) SARS-CoV-2 target nucleic acids are NOT DETECTED. The SARS-CoV-2 RNA is generally detectable in upper and lower respiratory specimens during the acute phase of infection. Negative results do not preclude SARS-CoV-2 infection, do not rule out co-infections with other pathogens, and should not be used as the sole basis for treatment or other patient management  decisions. Negative results must be combined with clinical observations, patient history, and epidemiological information. The expected result is Negative. Fact Sheet for Patients: SugarRoll.be Fact Sheet for Healthcare Providers: https://www.woods-mathews.com/ This test is not yet approved or cleared by the [place] FDA and  has been authorized for detection and/or diagnosis of SARS-CoV-2 by FDA under an Emergency Use Authorization (EUA). This EUA will remain  in effect (meaning this test can be used) for the duration of the COVID-19 declaration under Section 56 4(b)(1) of the Act, 21 U.S.C. section 360bbb-3(b)(1), unless the authorization is terminated or revoked sooner. Performed at [facility], [place] [address] [place], [place] [address]       Studies: No results found.  Scheduled Meds: . influenza vaccine adjuvanted  0.5 mL Intramuscular Tomorrow-1000  . latanoprost  1 drop Both Eyes QHS  . levothyroxine  50 mcg Intravenous Daily  . [START ON 08/31/2019] pantoprazole (PROTONIX) IV  40 mg Intravenous Q24H    Continuous Infusions: . dextrose 5 % and 0.9% NaCl 75 mL/hr at 08/30/19 1421  . diltiazem (CARDIZEM) infusion 10 mg/hr (08/30/19 1319)  . heparin 1,200 Units/hr (  08/30/19 1118)  . methocarbamol (ROBAXIN) IV       LOS: 1 day     Alma Friendly, MD [facility]  If 7PM-7AM, please contact night-coverage [email or web address] 08/30/2019, 2:48 PM

## 2019-08-30 NOTE — Plan of Care (Signed)
?  Problem: Clinical Measurements: ?Goal: Ability to maintain clinical measurements within normal limits will improve ?Outcome: Not Progressing ?  ?

## 2019-08-30 NOTE — Progress Notes (Signed)
Transferred patient to [facility]. Gave report to [place], Therapist, sports.  Patient belongings sent with patient and daughter has been informed about patient being transferred to [facility].  Farley Ly RN

## 2019-08-30 NOTE — Progress Notes (Signed)
ANTICOAGULATION CONSULT NOTE  Pharmacy Consult for heparin Indication: atrial fibrillation  Heparin Dosing Weight: 70.9 kg  Labs: Recent Labs    08/29/19 0004 08/29/19 0738 08/29/19 1259 08/29/19 1710 08/29/19 2137 08/30/19 0744  HGB 11.9* 11.6*  --   --   --  13.1  HCT 37.4 36.9  --   --   --  41.2  PLT 176 172  --   --   --  162  APTT  --   --   --  42* 46* 75*  HEPARINUNFRC  --   --  0.62  --   --  1.03*  CREATININE 1.65* 1.44*  --   --   --  1.23*    Assessment: 85 y.o. female with AFib, ELiquis on hold, for heparin  APTT 75, HL is >1 (does not correlate with APTT due to effect of DOAC on HL)  Goal of Therapy:  Heparin level 0.3-0.7 units/ml aPTT 66-102 seconds Monitor platelets by anticoagulation protocol: Yes   Plan:  Continue Heparin at 1200 units/hr APTT/HL in 8 hours  Kasen Sako A. Levada Dy, PharmD, BCPS, FNKF Clinical Pharmacist  Please utilize Amion for appropriate phone number to reach the unit pharmacist ([place])   08/30/2019 8:39 AM

## 2019-08-30 NOTE — Consult Note (Addendum)
Cardiology Consultation:   Patient ID: Alyssa Odonnell MRN: [id number]; DOB: 1933-12-07  Admit date: 08/28/2019 Date of Consult: 08/30/2019  Primary Care Provider: Drake Leach, MD Primary Cardiologist/Electrophysiologist: Dr. Lovena Le   Patient Profile:   Alyssa Odonnell is a 85 y.o. female with a hx of HTN, PAF on Eliquis, CVA, HLD, CKD III, mild AS,  hypothyroidism who is being seen today for the evaluation of surgical clearance at the request of Dr. Horris Latino.   Patient with hx of cryptogenic stroke. She had an ILR placed and was found to have PAF and has been started on Eliquis 2.5 mg twice daily. Last seen by Dr. Lovena Le 03/2018.  Last echo 02/2017 showed LVEF of 60-65%, mild AS.   History of Present Illness:   Alyssa Odonnell presented 11/9 with acute onset severe nausea. CT of abdomen showed large hiatal hernia with gastric volvulus with gastric outlet obstruction. Underwent fluoroscopy guided NG tube placement. Seen by surgery and plan to take her to OR tomorrow.  Eliquis on hold. On heparin for anticoagulation. BP was elevated on admit. Now stable. Scr improved 1.65>>1.44>>1.23. She was in sinus rhythm on admit>>  went to afib early morning today >> started IV cardizem >> converted to sinus rhythm.  Currently maintaining sinus rhythm at rate of 90s on IV Cardizem 15 mg/h.  Patient lives with daughter however very independent.  She uses walker occasionally.  She does gardening and household chores without any difficulty.  Compliant with her Eliquis.  She has chronic joint pains.  Denies chest pain, shortness of breath, orthopnea, PND, syncope, lower extremity edema, palpitation or blood in the stool or urine.  Heart Pathway Score:     Past Medical History:  Diagnosis Date   Gastric outlet obstruction 08/2019   Glaucoma    Hyperlipidemia    Hypertension    Hypothyroid    PAF (paroxysmal atrial fibrillation) ([place])    Stroke [facility])     Past Surgical History:  Procedure  Laterality Date   ABDOMINAL HERNIA REPAIR     LOOP RECORDER INSERTION N/A 03/11/2017   Procedure: Loop Recorder Insertion;  Surgeon: Evans Lance, MD;  Location: [facility];  Service: Cardiovascular;  Laterality: N/A;   PERIPHERAL VASCULAR CATHETERIZATION Right 07/06/2016   Procedure: Lower Extremity Angiography;  Surgeon: Angelia Mould, MD;  Location: [facility];  Service: Cardiovascular;  Laterality: Right;   PERIPHERAL VASCULAR CATHETERIZATION Right 07/06/2016   Procedure: Peripheral Vascular Intervention;  Surgeon: Angelia Mould, MD;  Location: [facility];  Service: Cardiovascular;  Laterality: Right;  Superficial femoral   TEE WITHOUT CARDIOVERSION N/A 03/11/2017   Procedure: TRANSESOPHAGEAL ECHOCARDIOGRAM (TEE);  Surgeon: Thayer Headings, MD;  Location: [facility] ENDOSCOPY;  Service: Cardiovascular;  Laterality: N/A;   TOTAL HIP ARTHROPLASTY       Inpatient Medications: Scheduled Meds:  influenza vaccine adjuvanted  0.5 mL Intramuscular Tomorrow-1000   latanoprost  1 drop Both Eyes QHS   levothyroxine  50 mcg Intravenous Daily   [START ON 08/31/2019] pantoprazole (PROTONIX) IV  40 mg Intravenous Q24H   Continuous Infusions:  dextrose 5 % and 0.9% NaCl     diltiazem (CARDIZEM) infusion 15 mg/hr (08/30/19 1046)   heparin 1,200 Units/hr (08/30/19 1118)   methocarbamol (ROBAXIN) IV     PRN Meds: acetaminophen **OR** acetaminophen, hydrALAZINE, methocarbamol (ROBAXIN) IV, morphine injection, ondansetron **OR** ondansetron (ZOFRAN) IV, promethazine  Allergies:    Allergies  Allergen Reactions   Aspirin Other (See Comments)  Red splotches   Atorvastatin Other (See Comments)    Muscle spams   Nabumetone Itching   Nsaids     Reaction (??)   Pravastatin Other (See Comments)    Stiff walking difficulty Stiff walking difficulty   Procaine Swelling   Rosuvastatin Nausea Only and Other (See Comments)    Reflux and Muscle  stiffness   Simvastatin Nausea Only and Other (See Comments)    Reflux and Muscle stiffness   Statins Other (See Comments)   Evolocumab Rash   Mobic [Meloxicam] Rash   Penicillins Rash    Has patient had a PCN reaction causing immediate rash, facial/tongue/throat swelling, SOB or lightheadedness with hypotension: Yes Has patient had a PCN reaction causing severe rash involving mucus membranes or skin necrosis: No Has patient had a PCN reaction that required hospitalization: No Has patient had a PCN reaction occurring within the last 10 years: No If all of the above answers are "NO", then may proceed with Cephalosporin use.      Social History:   Social History   Socioeconomic History   Marital status: Widowed    Spouse name: Not on file   Number of children: Not on file   Years of education: Not on file   Highest education level: Not on file  Occupational History   Not on file  Social Needs   Financial resource strain: Not on file   Food insecurity    Worry: Not on file    Inability: Not on file   Transportation needs    Medical: Not on file    Non-medical: Not on file  Tobacco Use   Smoking status: Never Smoker   Smokeless tobacco: Never Used  Substance and Sexual Activity   Alcohol use: No   Drug use: No   Sexual activity: Not on file  Lifestyle   Physical activity    Days per week: Not on file    Minutes per session: Not on file   Stress: Not on file  Relationships   Social connections    Talks on phone: Not on file    Gets together: Not on file    Attends religious service: Not on file    Active member of club or organization: Not on file    Attends meetings of clubs or organizations: Not on file    Relationship status: Not on file   Intimate partner violence    Fear of current or ex partner: Not on file    Emotionally abused: Not on file    Physically abused: Not on file    Forced sexual activity: Not on file  Other Topics Concern    Not on file  Social History Narrative   Not on file    Family History:   Family History  Problem Relation Age of Onset   Stroke Father      ROS:  Please see the history of present illness.  All other ROS reviewed and negative.     Physical Exam/Data:   Vitals:   08/30/19 1101 08/30/19 1116 08/30/19 1125 08/30/19 1131  BP: 108/75 121/74  119/73  Pulse: (!) 120 (!) 109 (!) 106 (!) 101  Resp:      Temp:      TempSrc:      SpO2: 100% 99%  99%  Weight:      Height:        Intake/Output Summary (Last 24 hours) at 08/30/2019 1150 Last data filed at 08/30/2019 1134 Gross per 24  hour  Intake 1203.28 ml  Output 1550 ml  Net -346.72 ml   Last 3 Weights 08/29/2019 03/30/2018 01/19/2018  Weight (lbs) 184 lb 1.4 oz 184 lb 184 lb  Weight (kg) 83.5 kg 83.462 kg 83.462 kg     Body mass index is 32.61 kg/m.  General: Thin frail elderly female in no acute distress HEENT: normal Lymph: no adenopathy Neck: no JVD Endocrine:  No thryomegaly Vascular: No carotid bruits; FA pulses 2+ bilaterally without bruits  Cardiac:  normal S1, S2; RRR; no murmur Lungs:  clear to auscultation bilaterally, no wheezing, rhonchi or rales  Abd: soft, nontender, no hepatomegaly  Ext: no edema Musculoskeletal:  No deformities, BUE and BLE strength normal and equal Skin: warm and dry  Neuro:  CNs 2-12 intact, no focal abnormalities noted Psych:  Normal affect   EKG:  The EKG was personally reviewed and demonstrates:  08/28/2019: SR at rate of 92 bpm 08/30/2019: AFib at rate of 138 bpm   Telemetry:  Telemetry was personally reviewed and demonstrates: Sinus rhythm at rate of 90 bpm. atrial fibrillation this morning.  Relevant CV Studies:  Echo 02/2017 - Left ventricle: The cavity size was normal. Systolic function was   normal. The estimated ejection fraction was in the range of 60%   to 65%. Wall motion was normal; there were no regional wall   motion abnormalities. Left ventricular  diastolic function   parameters were normal. - Aortic valve: There was very mild stenosis. There was no   regurgitation. Peak velocity (S): 211 cm/s. Mean gradient (S): 10   mm Hg. Valve area (VTI): 1.49 cm^2. Valve area (Vmax): 1.36 cm^2.   Valve area (Vmean): 1.34 cm^2. - Mitral valve: Transvalvular velocity was within the normal range.   There was no evidence for stenosis. There was no regurgitation. - Right ventricle: The cavity size was normal. Wall thickness was   normal. Systolic function was normal. - Atrial septum: No defect or patent foramen ovale was identified   by color flow Doppler. - Tricuspid valve: There was mild regurgitation. - Pulmonary arteries: Systolic pressure was within the normal   range. PA peak pressure: 23 mm Hg (S).  Laboratory Data:  High Sensitivity Troponin:   Recent Labs  Lab 08/29/19 0004 08/29/19 0409  TROPONINIHS 6 8     Chemistry Recent Labs  Lab 08/29/19 0004 08/29/19 0738 08/30/19 0744  NA 140 139 136  K 3.9 4.0 4.0  CL 106 104 102  CO2 23 24 21*  GLUCOSE 130* 159* 130*  BUN 22 17 13   CREATININE 1.65* 1.44* 1.23*  CALCIUM 9.5 9.2 9.0  GFRNONAA 28* 33* 41*  GFRAA 33* 39* 47*  ANIONGAP 11 11 13     Recent Labs  Lab 08/29/19 0004  PROT 7.0  ALBUMIN 3.9  AST 16  ALT 14  ALKPHOS 81  BILITOT 0.6   Hematology Recent Labs  Lab 08/29/19 0004 08/29/19 0738 08/30/19 0744  WBC 7.4 8.8 10.7*  RBC 3.92 3.80* 4.29  HGB 11.9* 11.6* 13.1  HCT 37.4 36.9 41.2  MCV 95.4 97.1 96.0  MCH 30.4 30.5 30.5  MCHC 31.8 31.4 31.8  RDW 13.1 13.0 13.1  PLT 176 172 162   BNPNo results for input(s): BNP, PROBNP in the last 168 hours.  DDimer No results for input(s): DDIMER in the last 168 hours.   Radiology/Studies:  Ct Abdomen Pelvis Wo Contrast  Result Date: 08/29/2019 CLINICAL DATA:  Abdominal pain, acute EXAM: CT ABDOMEN AND PELVIS WITHOUT  CONTRAST TECHNIQUE: Multidetector CT imaging of the abdomen and pelvis was performed  following the standard protocol without IV contrast. COMPARISON:  August 03, 2016 FINDINGS: Lower chest: The visualized heart size within normal limits. No pericardial fluid/thickening. Again noted is a large hiatal hernia with almost the entirety of the stomach rotated and flipped upward is, consistent with a gastric volvulus. The portion of the herniated stomach is markedly dilated with air and food stuff which suggest gastric outlet obstruction. Hepatobiliary: Although limited due to the lack of intravenous contrast, normal in appearance without gross focal abnormality. The patient is status post cholecystectomy. No biliary ductal dilation. Pancreas:  Unremarkable.  No surrounding inflammatory changes. Spleen: Normal in size. Although limited due to the lack of intravenous contrast, normal in appearance. Adrenals/Urinary Tract: Both adrenal glands appear normal. No renal or collecting system calculi. There is left renal atrophy seen. The bladder is unremarkable. Stomach/Bowel: The small bowel and colon are normal in appearance. Scattered colonic diverticula are noted without diverticulitis. Vascular/Lymphatic: There are no enlarged abdominal or pelvic lymph nodes. Aortic Atherosclerosis (ICD10-I70.0). Reproductive: Calcified uterine fibroids are present. Other: No evidence of abdominal wall mass or hernia. Musculoskeletal: No acute or significant osseous findings. IMPRESSION: Large hiatal hernia with gastric volvulus as on prior exam, however now there is a markedly dilated debris-filled stomach which is suggestive of gastric outlet obstruction. Electronically Signed   By: Prudencio Pair M.D.   On: 08/29/2019 03:13   Dg Abd 1 View  Result Date: 08/29/2019 CLINICAL DATA:  NG tube placement. EXAM: ABDOMEN - 1 VIEW; DG NASO G TUBE PLC W/FL-NO RAD COMPARISON:  CT 08/29/2019. FINDINGS: NG tube noted with tip coiled in the large hiatal hernia. Cardiac monitor noted chest. Cardiac leads and tubing noted over the  chest. IMPRESSION: NG tube noted with its tip coiled in the patient's known large hiatal hernia. Electronically Signed   By: Marcello Moores  Register   On: 08/29/2019 12:59   Dg Chest Portable 1 View  Result Date: 08/29/2019 CLINICAL DATA:  Chest pain and shortness of breath EXAM: PORTABLE CHEST 1 VIEW COMPARISON:  08/03/2016 FINDINGS: Cardiac shadow is stable. Aortic calcifications are seen. Loop recorder is noted on the left. Large hiatal hernia is again seen but increased when compared with the prior study. No focal infiltrate or sizable effusion is seen. No bony abnormality is noted. IMPRESSION: Changes consistent with large hiatal hernia which is increased in size from the prior study. No new focal abnormality is noted. Electronically Signed   By: Inez Catalina M.D.   On: 08/29/2019 00:30   Dg [place] Dys Tube Plc W/fl-no Rad  Result Date: 08/29/2019 CLINICAL DATA:  NG tube placement. EXAM: ABDOMEN - 1 VIEW; DG NASO G TUBE PLC W/FL-NO RAD COMPARISON:  CT 08/29/2019. FINDINGS: NG tube noted with tip coiled in the large hiatal hernia. Cardiac monitor noted chest. Cardiac leads and tubing noted over the chest. IMPRESSION: NG tube noted with its tip coiled in the patient's known large hiatal hernia. Electronically Signed   By: Marcello Moores  Register   On: 08/29/2019 12:59    Assessment and Plan:   1. Paroxysmal atrial fibrillation with rapid ventricular rate - She was in sinus rhythm upon arrival 11/9.  She had atrial fibrillation with rapid ventricular rate this morning.  She converted to sinus rhythm on IV Cardizem at 15 mg/cc.  Heart rate currently stable in 90s.  She was not on any rate control agent at home.  Suspect current episode due to acute  illness. -Recommended continuation of IV Cardizem until surgery tomorrow as risk for recurrent arrhythmia. -Updated nurse how to titrate Cardizem verbally.  Parameters given. -Eliquis on hold.  Continue heparin for anticoagulation. CHADSVASCs score of 6.   2.  Mild  aortic stenosis -By echocardiogram in 2018.  Asymptomatic.  3.  Surgical clearance -Patient is very independent doing household chores and yard work despite using walker.  She was easily getting greater than 4 METS of activity.  Last echocardiogram in 2018 showed preserved LV function.  4.  Hypertension -Home on losartan/hydrochlorthiazide on hold  Dr. Meda Coffee to see later today.   For questions or updates, please contact [place] Please consult [email or web address] for contact info under   Jarrett Soho, PA  08/30/2019 11:50 AM   The patient was seen, examined and discussed with Bhagat,Bhavinkumar PA-C and I agree with the above.   85 y.o. female with a hx of HTN, PAF on Eliquis, CVA, HLD, CKD III, mild AS, followed by Dr. Lovena Le, last seen a year ago who was admitted with acute gastric outlet obstruction.  The patient has history of cryptogenic stroke underwent placement of a loop recorder and was found to have paroxysmal A atrial fibrillation and has been on Eliquis chronically.  She was in sinus rhythm at the last years visit in our clinic.  Also on admission on Monday.  Yesterday she went to the A. fib with RVR and was started on Cardizem drip, she cardioverted spontaneously to sinus rhythm and remains in sinus rhythm with ventricular rate around 60.  Her Cardizem drip is at 15 mg/h, will decrease to 10 mg/h.  Eliquis was held and she is currently on IV heparin.  Her echo from 2018 shows LVEF of 6065%, mild aortic stenosis, no mitral regurgitation, normal left atrial size.  The plan is for surgery tomorrow, we will monitor closely for any signs of fluid overload or recurrent A-fib, on physical exam she is currently appears euvolemic she has 2 out of 6 systolic murmur, clear lungs lower extremities without edema.  Alyssa Dawley, MD 08/30/2019

## 2019-08-30 NOTE — Progress Notes (Signed)
ANTICOAGULATION CONSULT NOTE  Pharmacy Consult for heparin Indication: atrial fibrillation  Heparin Dosing Weight: 70.9 kg  Labs: Recent Labs    08/29/19 0004 08/29/19 0738 08/29/19 1259  08/29/19 2137 08/30/19 0744 08/30/19 1638  HGB 11.9* 11.6*  --   --   --  13.1  --   HCT 37.4 36.9  --   --   --  41.2  --   PLT 176 172  --   --   --  162  --   APTT  --   --   --    < > 46* 75* 65*  HEPARINUNFRC  --   --  0.62  --   --  1.03* 0.85*  CREATININE 1.65* 1.44*  --   --   --  1.23*  --    < > = values in this interval not displayed.    Assessment: 85 y.o. female with AFib to continue on IV heparin while Eliquis is on hold.  Heparin level is trending down and so is the aPTT.  No issue with heparin infusion nor bleeding per RN.  Goal of Therapy:  Heparin level 0.3-0.7 units/ml aPTT 66-102 seconds Monitor platelets by anticoagulation protocol: Yes   Plan:  Increase heparin gtt to 1300 units/hr F/U AM labs  Alyssa Odonnell D. Mina Marble, PharmD, BCPS, [facility] 08/30/2019, 6:50 PM

## 2019-08-30 NOTE — Progress Notes (Signed)
Patient transferred to unit at 1410hrs. Oriented to unit and plan of care for shift.  HR 98-105 SR-ST. Will continue to monitor.

## 2019-08-30 NOTE — Progress Notes (Addendum)
[facility] Progress Note     Subjective: CC-  In afib with RVR. Getting started on cardizem drip and transferring to tele floor.  Abdomen feels ok. A little sore. Denies bloating, nausea, or vomiting. No flatus or BM. NG tube placed yesterday with no output.  She also complains of left chest/neck pain. Worse with palpation and neck ROM.  Objective: Vital signs in last 24 hours: Temp:  [98.1 F (36.7 C)-99 F (37.2 C)] 98.1 F (36.7 C) (11/11 0705) Pulse Rate:  [81-143] 143 (11/11 0705) Resp:  [15-25] 20 (11/11 0705) BP: (120-155)/(56-123) 124/56 (11/11 0847) SpO2:  [88 %-100 %] 91 % (11/11 0705) Weight:  [83.5 kg] 83.5 kg (11/10 1300)    Intake/Output from previous day: 11/10 0701 - 11/11 0700 In: 1203.3 [I.V.:1203.3] Out: 1100 [Urine:1100] Intake/Output this shift: No intake/output data recorded.  PE: Gen:  Alert, NAD, pleasant HEENT: EOM's intact, pupils equal and round Card:  tachy Pulm:  Rate and effort normal Abd: Soft, mild distension, nontender, +BS, no HSM Psych: A&Ox3  Skin: no rashes noted, warm and dry  Lab Results:  Recent Labs    08/29/19 0738 08/30/19 0744  WBC 8.8 10.7*  HGB 11.6* 13.1  HCT 36.9 41.2  PLT 172 162   BMET Recent Labs    08/29/19 0738 08/30/19 0744  NA 139 136  K 4.0 4.0  CL 104 102  CO2 24 21*  GLUCOSE 159* 130*  BUN 17 13  CREATININE 1.44* 1.23*  CALCIUM 9.2 9.0   PT/INR No results for input(s): LABPROT, INR in the last 72 hours. CMP     Component Value Date/Time   NA 136 08/30/2019 0744   NA 141 03/03/2018 1352   K 4.0 08/30/2019 0744   CL 102 08/30/2019 0744   CO2 21 (L) 08/30/2019 0744   GLUCOSE 130 (H) 08/30/2019 0744   BUN 13 08/30/2019 0744   BUN 31 (H) 03/03/2018 1352   CREATININE 1.23 (H) 08/30/2019 0744   CALCIUM 9.0 08/30/2019 0744   PROT 7.0 08/29/2019 0004   ALBUMIN 3.9 08/29/2019 0004   AST 16 08/29/2019 0004   ALT 14 08/29/2019 0004   ALKPHOS 81 08/29/2019 0004   BILITOT 0.6  08/29/2019 0004   GFRNONAA 41 (L) 08/30/2019 0744   GFRAA 47 (L) 08/30/2019 0744   Lipase     Component Value Date/Time   LIPASE 30 08/29/2019 0004       Studies/Results: Ct Abdomen Pelvis Wo Contrast  Result Date: 08/29/2019 CLINICAL DATA:  Abdominal pain, acute EXAM: CT ABDOMEN AND PELVIS WITHOUT CONTRAST TECHNIQUE: Multidetector CT imaging of the abdomen and pelvis was performed following the standard protocol without IV contrast. COMPARISON:  August 03, 2016 FINDINGS: Lower chest: The visualized heart size within normal limits. No pericardial fluid/thickening. Again noted is a large hiatal hernia with almost the entirety of the stomach rotated and flipped upward is, consistent with a gastric volvulus. The portion of the herniated stomach is markedly dilated with air and food stuff which suggest gastric outlet obstruction. Hepatobiliary: Although limited due to the lack of intravenous contrast, normal in appearance without gross focal abnormality. The patient is status post cholecystectomy. No biliary ductal dilation. Pancreas:  Unremarkable.  No surrounding inflammatory changes. Spleen: Normal in size. Although limited due to the lack of intravenous contrast, normal in appearance. Adrenals/Urinary Tract: Both adrenal glands appear normal. No renal or collecting system calculi. There is left renal atrophy seen. The bladder is unremarkable. Stomach/Bowel: The small bowel and  colon are normal in appearance. Scattered colonic diverticula are noted without diverticulitis. Vascular/Lymphatic: There are no enlarged abdominal or pelvic lymph nodes. Aortic Atherosclerosis (ICD10-I70.0). Reproductive: Calcified uterine fibroids are present. Other: No evidence of abdominal wall mass or hernia. Musculoskeletal: No acute or significant osseous findings. IMPRESSION: Large hiatal hernia with gastric volvulus as on prior exam, however now there is a markedly dilated debris-filled stomach which is suggestive of  gastric outlet obstruction. Electronically Signed   By: Prudencio Pair M.D.   On: 08/29/2019 03:13   Dg Abd 1 View  Result Date: 08/29/2019 CLINICAL DATA:  NG tube placement. EXAM: ABDOMEN - 1 VIEW; DG NASO G TUBE PLC W/FL-NO RAD COMPARISON:  CT 08/29/2019. FINDINGS: NG tube noted with tip coiled in the large hiatal hernia. Cardiac monitor noted chest. Cardiac leads and tubing noted over the chest. IMPRESSION: NG tube noted with its tip coiled in the patient's known large hiatal hernia. Electronically Signed   By: Marcello Moores  Register   On: 08/29/2019 12:59   Dg Chest Portable 1 View  Result Date: 08/29/2019 CLINICAL DATA:  Chest pain and shortness of breath EXAM: PORTABLE CHEST 1 VIEW COMPARISON:  08/03/2016 FINDINGS: Cardiac shadow is stable. Aortic calcifications are seen. Loop recorder is noted on the left. Large hiatal hernia is again seen but increased when compared with the prior study. No focal infiltrate or sizable effusion is seen. No bony abnormality is noted. IMPRESSION: Changes consistent with large hiatal hernia which is increased in size from the prior study. No new focal abnormality is noted. Electronically Signed   By: Inez Catalina M.D.   On: 08/29/2019 00:30   Dg Loyce Dys Tube Plc W/fl-no Rad  Result Date: 08/29/2019 CLINICAL DATA:  NG tube placement. EXAM: ABDOMEN - 1 VIEW; DG NASO G TUBE PLC W/FL-NO RAD COMPARISON:  CT 08/29/2019. FINDINGS: NG tube noted with tip coiled in the large hiatal hernia. Cardiac monitor noted chest. Cardiac leads and tubing noted over the chest. IMPRESSION: NG tube noted with its tip coiled in the patient's known large hiatal hernia. Electronically Signed   By: Marcello Moores  Register   On: 08/29/2019 12:59    Anti-infectives: Anti-infectives (From admission, onward)   None       Assessment/Plan Hx CVA Hypothyroidism HTN HLD Paroxysmal A Fib on Elquis - eliquis on hold, on IV heparin Afib with RVR - starting cardizem drip and transferring to tele  floor - Above per TRH -  Hiatal Hernia with Gastric Outlet Obstruction -Patient stable from abdominal standpoint. Abdomen essentially nontender. NG tube flushed and changed to LIWS, working appropriately now. Continue bowel rest. Mobilize as able. With new onset Afib RVR will delay surgery at least until Friday. Continue to hold eliquis. Will ask cardiology to see for surgical clearance.  FEN - NPO, NGT, IVF VTE - SCDs, Hold Elqiuis, iv Heparin  ID - None   LOS: 1 day    Wellington Hampshire, [facility] Surgery 08/30/2019, 9:19 AM Please see Amion for pager number during day hours 7:00am-4:30pm

## 2019-08-30 NOTE — Significant Event (Signed)
Rapid Response Event Note  Overview: Time Called: 0840 Arrival Time: 0842 Event Type: Cardiac  Initial Focused Assessment: Patient admitted from ED about 4 pm yesterday with gastric volvulus/gastric outlet obstruction.  NG placed under fluro.  Patient NPO,  NG to [facility]  Per flowsheet patient converted to RAF about 0600.  12 lead EKG done Orders received for cardizem gtt and transfer to progressive care.  Upon my arrival patient is alert and oriented easily conversant.  Complains of left shoulder pain and nausea.  Manual BP [id number] (systolic correlates with automatic) RAF 130-150 RR 20-22 O2 sat 91% on RA  Interventions: Started 3rd PIV Surgical PA at bedside to assess patient,  NG tube flushed Cardizem bolus 10mg  given  Cardizem gtt started at 5mg /hr Morphine given for pain,  Heat pack placed on left shoulder Phenergan given for nausea  Titrated Cardizem gtt to 15mg /hr per orders  BP 119/73  SR/ST 101  RR 20  O2 sat 99% on 2L [place]  Cardiology at bedside to assess patient.  Patient transferred to [facility] progressive care   Plan of Care (if not transferred):  Event Summary: Name of Physician Notified: Lesia Sago at      at    Outcome: Transferred (Comment)  Event End Time: Rupert  Raliegh Ip

## 2019-08-31 ENCOUNTER — Inpatient Hospital Stay (HOSPITAL_COMMUNITY): Payer: Medicare Other

## 2019-08-31 DIAGNOSIS — K311 Adult hypertrophic pyloric stenosis: Principal | ICD-10-CM

## 2019-08-31 LAB — GLUCOSE, CAPILLARY
Glucose-Capillary: 107 mg/dL — ABNORMAL HIGH (ref 70–99)
Glucose-Capillary: 125 mg/dL — ABNORMAL HIGH (ref 70–99)
Glucose-Capillary: 113 mg/dL — ABNORMAL HIGH (ref 70–99)
Glucose-Capillary: 124 mg/dL — ABNORMAL HIGH (ref 70–99)
Glucose-Capillary: 101 mg/dL — ABNORMAL HIGH (ref 70–99)

## 2019-08-31 LAB — BASIC METABOLIC PANEL
Anion gap: 9 (ref 5–15)
BUN: 12 mg/dL (ref 8–23)
CO2: 23 mmol/L (ref 22–32)
Calcium: 8.8 mg/dL — ABNORMAL LOW (ref 8.9–10.3)
Chloride: 103 mmol/L (ref 98–111)
Creatinine, Ser: 1.21 mg/dL — ABNORMAL HIGH (ref 0.44–1.00)
GFR calc Af Amer: 48 mL/min — ABNORMAL LOW (ref >60)
GFR calc non Af Amer: 41 mL/min — ABNORMAL LOW (ref >60)
Glucose, Bld: 107 mg/dL — ABNORMAL HIGH (ref 70–99)
Potassium: 3.6 mmol/L (ref 3.5–5.1)
Sodium: 135 mmol/L (ref 135–145)

## 2019-08-31 LAB — CBC
HCT: 37.8 % (ref 36.0–46.0)
Hemoglobin: 12.1 g/dL (ref 12.0–15.0)
MCH: 30.1 pg (ref 26.0–34.0)
MCHC: 32 g/dL (ref 30.0–36.0)
MCV: 94 fL (ref 80.0–100.0)
Platelets: 140 10*3/uL — ABNORMAL LOW (ref 150–400)
RBC: 4.02 MIL/uL (ref 3.87–5.11)
RDW: 13 % (ref 11.5–15.5)
WBC: 10.8 10*3/uL — ABNORMAL HIGH (ref 4.0–10.5)
nRBC: 0 % (ref 0.0–0.2)

## 2019-08-31 LAB — APTT
aPTT: 111 seconds — ABNORMAL HIGH (ref 24–36)
aPTT: 82 seconds — ABNORMAL HIGH (ref 24–36)

## 2019-08-31 LAB — HEPARIN LEVEL (UNFRACTIONATED): Heparin Unfractionated: 0.85 IU/mL — ABNORMAL HIGH (ref 0.30–0.70)

## 2019-08-31 MED ORDER — LIDOCAINE VISCOUS HCL 2 % MT SOLN
OROMUCOSAL | Status: AC
  Filled 2019-08-31: qty 15

## 2019-08-31 MED ORDER — AMIODARONE LOAD VIA INFUSION
150.0000 mg | Freq: Once | INTRAVENOUS | Status: AC
  Administered 2019-08-31: 150 mg via INTRAVENOUS
  Filled 2019-08-31: qty 83.34

## 2019-08-31 MED ORDER — AMIODARONE HCL IN DEXTROSE 360-4.14 MG/200ML-% IV SOLN
60.0000 mg/h | INTRAVENOUS | Status: DC
  Administered 2019-08-31 (×2): 60 mg/h via INTRAVENOUS
  Filled 2019-08-31: qty 200

## 2019-08-31 MED ORDER — DEXTROSE-NACL 5-0.9 % IV SOLN
INTRAVENOUS | Status: AC
  Administered 2019-08-31: 19:00:00 via INTRAVENOUS

## 2019-08-31 MED ORDER — AMIODARONE IV BOLUS ONLY 150 MG/100ML
150.0000 mg | Freq: Once | INTRAVENOUS | Status: AC
  Administered 2019-08-31: 150 mg via INTRAVENOUS

## 2019-08-31 MED ORDER — AMIODARONE HCL IN DEXTROSE 360-4.14 MG/200ML-% IV SOLN
30.0000 mg/h | INTRAVENOUS | Status: DC
  Administered 2019-08-31 – 2019-09-07 (×11): 30 mg/h via INTRAVENOUS
  Filled 2019-08-31 (×15): qty 200

## 2019-08-31 NOTE — Progress Notes (Signed)
Subjective: CC: Patient concerned about her A. Fib. Complains of reflux. No abdominal pain, or nausea. NGT does not appear to be functioning currently. No output in cannister 250/cc since yesterday. No flatus or BM. She also complains of some left sided neck pain that is worse with palpation and movement.  Objective: Vital signs in last 24 hours: Temp:  [98.6 F (37 C)-100.2 F (37.9 C)] 98.6 F (37 C) (11/12 0357) Pulse Rate:  [63-142] 105 (11/12 0845) Resp:  [16-28] 23 (11/12 0845) BP: (97-127)/(40-88) 107/80 (11/12 0845) SpO2:  [88 %-100 %] 98 % (11/12 0845) Weight:  [75.8 kg] 75.8 kg (11/12 0357) Last BM Date: 08/29/19  Intake/Output from previous day: 11/11 0701 - 11/12 0700 In: 2301 [P.O.:630; I.V.:1671] Out: 1150 [Urine:900; Emesis/NG output:250] Intake/Output this shift: No intake/output data recorded.  PE: Gen:  Alert, NAD, pleasant HEENT: NGT in place. Filter is wet. This was replaced. NGT was flushed and now working again.  Card: Irr, Irr Pulm:  Rate and effort normal Abd: Soft, mild upper abdominal distension, nontender, +BS, no HSM Psych: A&Ox3  Skin: no rashes noted, warm and dry  Lab Results:  Recent Labs    08/30/19 0744 08/31/19 0404  WBC 10.7* 10.8*  HGB 13.1 12.1  HCT 41.2 37.8  PLT 162 140*   BMET Recent Labs    08/30/19 0744 08/31/19 0404  NA 136 135  K 4.0 3.6  CL 102 103  CO2 21* 23  GLUCOSE 130* 107*  BUN 13 12  CREATININE 1.23* 1.21*  CALCIUM 9.0 8.8*   PT/INR No results for input(s): LABPROT, INR in the last 72 hours. CMP     Component Value Date/Time   NA 135 08/31/2019 0404   NA 141 03/03/2018 1352   K 3.6 08/31/2019 0404   CL 103 08/31/2019 0404   CO2 23 08/31/2019 0404   GLUCOSE 107 (H) 08/31/2019 0404   BUN 12 08/31/2019 0404   BUN 31 (H) 03/03/2018 1352   CREATININE 1.21 (H) 08/31/2019 0404   CALCIUM 8.8 (L) 08/31/2019 0404   PROT 7.0 08/29/2019 0004   ALBUMIN 3.9 08/29/2019 0004   AST 16 08/29/2019  0004   ALT 14 08/29/2019 0004   ALKPHOS 81 08/29/2019 0004   BILITOT 0.6 08/29/2019 0004   GFRNONAA 41 (L) 08/31/2019 0404   GFRAA 48 (L) 08/31/2019 0404   Lipase     Component Value Date/Time   LIPASE 30 08/29/2019 0004       Studies/Results: Dg Abd 1 View  Result Date: 08/29/2019 CLINICAL DATA:  NG tube placement. EXAM: ABDOMEN - 1 VIEW; DG NASO G TUBE PLC W/FL-NO RAD COMPARISON:  CT 08/29/2019. FINDINGS: NG tube noted with tip coiled in the large hiatal hernia. Cardiac monitor noted chest. Cardiac leads and tubing noted over the chest. IMPRESSION: NG tube noted with its tip coiled in the patient's known large hiatal hernia. Electronically Signed   By: Marcello Moores  Register   On: 08/29/2019 12:59   Dg Loyce Dys Tube Plc W/fl-no Rad  Result Date: 08/29/2019 CLINICAL DATA:  NG tube placement. EXAM: ABDOMEN - 1 VIEW; DG NASO G TUBE PLC W/FL-NO RAD COMPARISON:  CT 08/29/2019. FINDINGS: NG tube noted with tip coiled in the large hiatal hernia. Cardiac monitor noted chest. Cardiac leads and tubing noted over the chest. IMPRESSION: NG tube noted with its tip coiled in the patient's known large hiatal hernia. Electronically Signed   By: Marcello Moores  Register   On: 08/29/2019 12:59  Anti-infectives: Anti-infectives (From admission, onward)   None      Assessment/Plan Hx CVA Hypothyroidism HTN HLD Paroxysmal A Fib on Elquis - eliquis on hold, on IV heparin Afib with RVR - starting cardizem drip. Cardiology on board  - Above per TRH -  Hiatal Hernia with Gastric Outlet Obstruction -Patient stable from abdominal standpoint. Abdomen nontender. NG tube flushed and working appropriately now. Continue bowel rest. Mobilize as able. With Afib will delay surgery at least until Friday. Continue to hold eliquis.   FEN -NPO, NGT, IVF VTE -SCDs, Hold Elqiuis, IV Heparin ID -None   LOS: 2 days    Jillyn Ledger , [facility] Surgery 08/31/2019, 9:45 AM Please see Amion for  pager number during day hours 7:00am-4:30pm

## 2019-08-31 NOTE — Progress Notes (Addendum)
Progress Note  Patient Name: Alyssa Odonnell Date of Encounter: 08/31/2019  Primary Cardiologist: Cristopher Peru, MD   Subjective   Hiatal hernia surgery today. Patient converted to NSR yesterday, but is now back in afib, rates 120s. Denies SOB or CP.   Inpatient Medications    Scheduled Meds: . chlorhexidine  15 mL Mouth Rinse BID  . influenza vaccine adjuvanted  0.5 mL Intramuscular Tomorrow-1000  . latanoprost  1 drop Both Eyes QHS  . levothyroxine  50 mcg Intravenous Daily  . mouth rinse  15 mL Mouth Rinse q12n4p  . pantoprazole (PROTONIX) IV  40 mg Intravenous Q24H   Continuous Infusions: . dextrose 5 % and 0.9% NaCl 75 mL/hr at 08/31/19 0600  . diltiazem (CARDIZEM) infusion 10 mg/hr (08/31/19 [id number])  . heparin 1,250 Units/hr (08/31/19 [id number])  . methocarbamol (ROBAXIN) IV     PRN Meds: acetaminophen **OR** acetaminophen, hydrALAZINE, methocarbamol (ROBAXIN) IV, morphine injection, ondansetron **OR** ondansetron (ZOFRAN) IV, promethazine   Vital Signs    Vitals:   08/30/19 2307 08/30/19 2336 08/31/19 0357 08/31/19 0636  BP: (!) 97/40 109/70 126/74 105/78  Pulse: (!) 107 63 (!) 123 (!) 120  Resp: (!) 25 16 20 20   Temp:   98.6 F (37 C)   TempSrc:   Oral   SpO2: 96% 96% 93% 96%  Weight:   75.8 kg   Height:        Intake/Output Summary (Last 24 hours) at 08/31/2019 0743 Last data filed at 08/31/2019 [id number] Gross per 24 hour  Intake 2301.04 ml  Output 1150 ml  Net 1151.04 ml   Last 3 Weights 08/31/2019 08/29/2019 03/30/2018  Weight (lbs) 167 lb 1.7 oz 184 lb 1.4 oz 184 lb  Weight (kg) 75.8 kg 83.5 kg 83.462 kg      Telemetry    NSR yesterday PM, converted to afib overnight, rates now 110-120 - Personally Reviewed  ECG    pending - Personally Reviewed  Physical Exam   GEN: No acute distress.   Neck: No JVD Cardiac: Irreg Irreg, systolic murmurs, rubs, or gallops.  Respiratory: Clear to auscultation bilaterally. GI: Soft, nontender, non-distended  MS:  No edema; No deformity. Neuro:  Nonfocal  Psych: Normal affect   Labs    High Sensitivity Troponin:   Recent Labs  Lab 08/29/19 0004 08/29/19 0409  TROPONINIHS 6 8      Chemistry Recent Labs  Lab 08/29/19 0004 08/29/19 0738 08/30/19 0744 08/31/19 0404  NA 140 139 136 135  K 3.9 4.0 4.0 3.6  CL 106 104 102 103  CO2 23 24 21* 23  GLUCOSE 130* 159* 130* 107*  BUN 22 17 13 12   CREATININE 1.65* 1.44* 1.23* 1.21*  CALCIUM 9.5 9.2 9.0 8.8*  PROT 7.0  --   --   --   ALBUMIN 3.9  --   --   --   AST 16  --   --   --   ALT 14  --   --   --   ALKPHOS 81  --   --   --   BILITOT 0.6  --   --   --   GFRNONAA 28* 33* 41* 41*  GFRAA 33* 39* 47* 48*  ANIONGAP 11 11 13 9      Hematology Recent Labs  Lab 08/29/19 0738 08/30/19 0744 08/31/19 0404  WBC 8.8 10.7* 10.8*  RBC 3.80* 4.29 4.02  HGB 11.6* 13.1 12.1  HCT 36.9 41.2 37.8  MCV 97.1 96.0  94.0  MCH 30.5 30.5 30.1  MCHC 31.4 31.8 32.0  RDW 13.0 13.1 13.0  PLT 172 162 140*    BNPNo results for input(s): BNP, PROBNP in the last 168 hours.   DDimer No results for input(s): DDIMER in the last 168 hours.   Radiology    Dg Abd 1 View  Result Date: 08/29/2019 CLINICAL DATA:  NG tube placement. EXAM: ABDOMEN - 1 VIEW; DG NASO G TUBE PLC W/FL-NO RAD COMPARISON:  CT 08/29/2019. FINDINGS: NG tube noted with tip coiled in the large hiatal hernia. Cardiac monitor noted chest. Cardiac leads and tubing noted over the chest. IMPRESSION: NG tube noted with its tip coiled in the patient's known large hiatal hernia. Electronically Signed   By: Marcello Moores  Register   On: 08/29/2019 12:59   Dg Loyce Dys Tube Plc W/fl-no Rad  Result Date: 08/29/2019 CLINICAL DATA:  NG tube placement. EXAM: ABDOMEN - 1 VIEW; DG NASO G TUBE PLC W/FL-NO RAD COMPARISON:  CT 08/29/2019. FINDINGS: NG tube noted with tip coiled in the large hiatal hernia. Cardiac monitor noted chest. Cardiac leads and tubing noted over the chest. IMPRESSION: NG tube noted with its  tip coiled in the patient's known large hiatal hernia. Electronically Signed   By: Marcello Moores  Register   On: 08/29/2019 12:59    Cardiac Studies   Echo 2018  Left ventricle: The cavity size was normal. Systolic function was   normal. The estimated ejection fraction was in the range of 60%   to 65%. Wall motion was normal; there were no regional wall   motion abnormalities. Left ventricular diastolic function   parameters were normal. - Aortic valve: There was very mild stenosis. There was no   regurgitation. Peak velocity (S): 211 cm/s. Mean gradient (S): 10   mm Hg. Valve area (VTI): 1.49 cm^2. Valve area (Vmax): 1.36 cm^2.   Valve area (Vmean): 1.34 cm^2. - Mitral valve: Transvalvular velocity was within the normal range.   There was no evidence for stenosis. There was no regurgitation. - Right ventricle: The cavity size was normal. Wall thickness was   normal. Systolic function was normal. - Atrial septum: No defect or patent foramen ovale was identified   by color flow Doppler. - Tricuspid valve: There was mild regurgitation. - Pulmonary arteries: Systolic pressure was within the normal   range. PA peak pressure: 23 mm Hg (S).  Patient Profile     85 y.o. female with a hx of HTN, PAf on Eliquis, CVA, HLD, CKD III, mild AS, hypothyroidism who was seen by cardiology for surgical clearance for surgery for hiatal hernia.  Assessment & Plan    Paroxysmal Afib with RVR Patient was NSR upon arrival and went into afib RVR yesterday AM and started on IV Cardizem and rates improved. Patient cardioverted to NSR yesterday afternoon but converted back to afib overnight, Rates 110-120s - suspect episode is due to acute illness. Plan is for surgery today - Patient on Cardizem 10mg /hr. Cannot titrate for hypotension - Will get an EKG this AM - Eliquis on hold. Continue heparin for a/c - continue IV Cardizem. Might have to settle for higher rates at this point. Given that the acute illness is  likely contributing to afib it is preferable to proceed with surgery. Can consider addition of amiodarone. MD to see  Mild AS - by echo in 2018 - stable  Surgical Clearance for Hiatal hernia with Gastric outlet Obstruction - Appears to be very functional and independent - Echo  in 2018 showed preserved LV function - METS >4 - Plan for surgery today - will follow along. Monitor fluid status   Hypertension - Home losartan and HCTZ on hold  For questions or updates, please contact [place] Please consult [email or web address] for contact info under     Signed, Cadence Ninfa Meeker, PA-C  08/31/2019, 7:43 AM    The patient was seen, examined and discussed with Cadence Ninfa Meeker, PA-C   and I agree with the above.   The patient is back in a-fib with RVR, HR up to 120 BPM, on cardizem drip and hypotensive in the settings of acute illness, I will switch to amiodarone. She should proceed with surgery, a-fib most likely improves afterwards.  Ena Dawley, MD 08/31/2019

## 2019-08-31 NOTE — Progress Notes (Signed)
ANTICOAGULATION CONSULT NOTE  Pharmacy Consult for heparin Indication: atrial fibrillation  Heparin Dosing Weight: 70.9 kg  Labs: Recent Labs    08/29/19 0004 08/29/19 0738  08/30/19 0744 08/30/19 1638 08/31/19 0404  HGB 11.9* 11.6*  --  13.1  --  12.1  HCT 37.4 36.9  --  41.2  --  37.8  PLT 176 172  --  162  --  140*  APTT  --   --    < > 75* 65* 111*  HEPARINUNFRC  --   --    < > 1.03* 0.85* 0.85*  CREATININE 1.65* 1.44*  --  1.23*  --   --    < > = values in this interval not displayed.    Assessment: 85 y.o. female with AFib to continue on IV heparin while Eliquis is on hold.    Heparin level and aPTT are supratherapeutic.  No issue with heparin infusion nor bleeding per RN.  Goal of Therapy:  Heparin level 0.3-0.7 units/ml aPTT 66-102 seconds Monitor platelets by anticoagulation protocol: Yes   Plan:  Decrease heparin gtt to 1250 units/hr F/u aPTT and HL in 6 hr F/U AM labs  Alanda Slim, PharmD, [facility] Clinical Pharmacist Please see AMION for all Pharmacists' Contact Phone Numbers 08/31/2019, 5:58 AM

## 2019-08-31 NOTE — H&P (View-Only) (Signed)
Subjective: CC: Patient concerned about her A. Fib. Complains of reflux. No abdominal pain, or nausea. NGT does not appear to be functioning currently. No output in cannister 250/cc since yesterday. No flatus or BM. She also complains of some left sided neck pain that is worse with palpation and movement.  Objective: Vital signs in last 24 hours: Temp:  [98.6 F (37 C)-100.2 F (37.9 C)] 98.6 F (37 C) (11/12 0357) Pulse Rate:  [63-142] 105 (11/12 0845) Resp:  [16-28] 23 (11/12 0845) BP: (97-127)/(40-88) 107/80 (11/12 0845) SpO2:  [88 %-100 %] 98 % (11/12 0845) Weight:  [75.8 kg] 75.8 kg (11/12 0357) Last BM Date: 08/29/19  Intake/Output from previous day: 11/11 0701 - 11/12 0700 In: 2301 [P.O.:630; I.V.:1671] Out: 1150 [Urine:900; Emesis/NG output:250] Intake/Output this shift: No intake/output data recorded.  PE: Gen:  Alert, NAD, pleasant HEENT: NGT in place. Filter is wet. This was replaced. NGT was flushed and now working again.  Card: Irr, Irr Pulm:  Rate and effort normal Abd: Soft, mild upper abdominal distension, nontender, +BS, no HSM Psych: A&Ox3  Skin: no rashes noted, warm and dry  Lab Results:  Recent Labs    08/30/19 0744 08/31/19 0404  WBC 10.7* 10.8*  HGB 13.1 12.1  HCT 41.2 37.8  PLT 162 140*   BMET Recent Labs    08/30/19 0744 08/31/19 0404  NA 136 135  K 4.0 3.6  CL 102 103  CO2 21* 23  GLUCOSE 130* 107*  BUN 13 12  CREATININE 1.23* 1.21*  CALCIUM 9.0 8.8*   PT/INR No results for input(s): LABPROT, INR in the last 72 hours. CMP     Component Value Date/Time   NA 135 08/31/2019 0404   NA 141 03/03/2018 1352   K 3.6 08/31/2019 0404   CL 103 08/31/2019 0404   CO2 23 08/31/2019 0404   GLUCOSE 107 (H) 08/31/2019 0404   BUN 12 08/31/2019 0404   BUN 31 (H) 03/03/2018 1352   CREATININE 1.21 (H) 08/31/2019 0404   CALCIUM 8.8 (L) 08/31/2019 0404   PROT 7.0 08/29/2019 0004   ALBUMIN 3.9 08/29/2019 0004   AST 16 08/29/2019  0004   ALT 14 08/29/2019 0004   ALKPHOS 81 08/29/2019 0004   BILITOT 0.6 08/29/2019 0004   GFRNONAA 41 (L) 08/31/2019 0404   GFRAA 48 (L) 08/31/2019 0404   Lipase     Component Value Date/Time   LIPASE 30 08/29/2019 0004       Studies/Results: Dg Abd 1 View  Result Date: 08/29/2019 CLINICAL DATA:  NG tube placement. EXAM: ABDOMEN - 1 VIEW; DG NASO G TUBE PLC W/FL-NO RAD COMPARISON:  CT 08/29/2019. FINDINGS: NG tube noted with tip coiled in the large hiatal hernia. Cardiac monitor noted chest. Cardiac leads and tubing noted over the chest. IMPRESSION: NG tube noted with its tip coiled in the patient's known large hiatal hernia. Electronically Signed   By: Marcello Moores  Register   On: 08/29/2019 12:59   Dg Loyce Dys Tube Plc W/fl-no Rad  Result Date: 08/29/2019 CLINICAL DATA:  NG tube placement. EXAM: ABDOMEN - 1 VIEW; DG NASO G TUBE PLC W/FL-NO RAD COMPARISON:  CT 08/29/2019. FINDINGS: NG tube noted with tip coiled in the large hiatal hernia. Cardiac monitor noted chest. Cardiac leads and tubing noted over the chest. IMPRESSION: NG tube noted with its tip coiled in the patient's known large hiatal hernia. Electronically Signed   By: Marcello Moores  Register   On: 08/29/2019 12:59  Anti-infectives: Anti-infectives (From admission, onward)   None      Assessment/Plan Hx CVA Hypothyroidism HTN HLD Paroxysmal A Fib on Elquis - eliquis on hold, on IV heparin Afib with RVR - starting cardizem drip. Cardiology on board  - Above per TRH -  Hiatal Hernia with Gastric Outlet Obstruction -Patient stable from abdominal standpoint. Abdomen nontender. NG tube flushed and working appropriately now. Continue bowel rest. Mobilize as able. With Afib will delay surgery at least until Friday. Continue to hold eliquis.   FEN -NPO, NGT, IVF VTE -SCDs, Hold Elqiuis, IV Heparin ID -None   LOS: 2 days    Jillyn Ledger , [facility] Surgery 08/31/2019, 9:45 AM Please see Amion for  pager number during day hours 7:00am-4:30pm

## 2019-08-31 NOTE — Progress Notes (Signed)
Cardiologist on call notified of pt's HR maintaining in the 120-140s despite amio drip.New orders for 150 mg Amio bolus.  Hoover Brunette, RN

## 2019-08-31 NOTE — Progress Notes (Signed)
[place] for heparin Indication: atrial fibrillation  Heparin Dosing Weight: 70.9 kg  Labs: Recent Labs    08/29/19 0738  08/30/19 0744 08/30/19 1638 08/31/19 0404 08/31/19 1245  HGB 11.6*  --  13.1  --  12.1  --   HCT 36.9  --  41.2  --  37.8  --   PLT 172  --  162  --  140*  --   APTT  --    < > 75* 65* 111* 82*  HEPARINUNFRC  --    < > 1.03* 0.85* 0.85*  --   CREATININE 1.44*  --  1.23*  --  1.21*  --    < > = values in this interval not displayed.    Assessment: 85 y.o. female with AFib to continue on IV heparin while Eliquis is on hold.  Patient noted w/ gastric outlet obstruction and surgery on Friday or Monday -aPTT at goal   Goal of Therapy:  Heparin level 0.3-0.7 units/ml aPTT 66-102 seconds Monitor platelets by anticoagulation protocol: Yes   Plan:  -No heparin changes needed -Daily heparin level and CBC  Hildred Laser, PharmD Clinical Pharmacist **Pharmacist phone directory can now be found on [email or web address] ([email or web address]).  Listed under [place].

## 2019-08-31 NOTE — Progress Notes (Signed)
Surgeon [employer] notified of pt vomiting dark emesis twice since he came by this am. Pt only has about 50 cc out from tube. Most of her output is from when her tube is flushed. Per previous xray pt's Ng tube is in the tip of her known hiatal hernia & coiled. New order to remove & replace tube & get xray post to verify placement. Hoover Brunette, RN

## 2019-08-31 NOTE — Progress Notes (Signed)
PROGRESS NOTE  Alyssa Odonnell [id number] DOB: 11/08/1933 DOA: 08/28/2019 PCP: Drake Leach, MD  HPI/Recap of past 24 hours: HPI from Alyssa Odonnell 85 year old female, lives with her daughter, ambulates with the help of a walker, PMH of PAF on Eliquis, embolic CVA, HTN, HLD, hypothyroid, PAD, cholecystectomy, presented to [facility] ED on 08/28/2019 due to abrupt onset of severe nausea, dry heaving without vomiting, chest/epigastric abdominal pain.  CT abdomen showed large hiatal hernia with gastric volvulus with gastric outlet obstruction.  Multiple unsuccessful attempts to pass NG tube at bedside.  IR consulted for NG tube under fluoroscopy.  General surgery consulted and have tentatively plan for OR on 11/12, if no improvement with medical management.    Patient initially converted to normal sinus rhythm but later switched back to A. fib with RVR, heart rates around 120s.  Patient reports having significant reflux this morning, denies any other new complaints.  Patient denies any chest pain, worsening shortness of breath, fever/chills, nausea/vomiting or worsening abdominal pain.   Assessment/Plan: Principal Problem:   Volvulus of stomach Active Problems:   PAD (peripheral artery disease) (HCC)   Essential hypertension   PAF (paroxysmal atrial fibrillation) (HCC)   Gastric outlet obstruction  Large hiatal hernia with gastric outlet obstruction CT abdomen/pelvis on 11/10 showed above Status post NG tube, bowel rest, IV fluids, pain management General surgery on board, surgery postponed until further notice due to patient's A. fib with RVR Patient cleared by cardiology for surgery  Paroxysmal A. fib with RVR Heart rate noted to be in the 120s EKG showed A. fib TTE on 02/2017 showed LVEF of 60 to 65% Patient was initially started on diltiazem drip, then switched to amiodarone drip due to hypotension Continue amiodarone drip Continue IV Heparin Hold home Eliquis Cardiology consulted,  appreciate recs  Hypertension BP soft Hold home Benicar, hydrochlorothiazide Monitor closely  Hypothyroidism Continue IV Synthroid  CKD stage IIIb Creatinine at baseline Daily BMP       Malnutrition Type:      Malnutrition Characteristics:      Nutrition Interventions:       Estimated body mass index is 29.6 kg/m as calculated from the following:   Height as of this encounter: 5\' 3"  (1.6 m).   Weight as of this encounter: 75.8 kg.     Code Status: Full  Family Communication: None at bedside  Disposition Plan: To be determined   Consultants:  General surgery  Cardiology  Procedures:  None  Antimicrobials:  None  DVT prophylaxis: IV Heparin   Objective: Vitals:   08/30/19 2336 08/31/19 0357 08/31/19 0636 08/31/19 0845  BP: 109/70 126/74 105/78 107/80  Pulse: 63 (!) 123 (!) 120 (!) 105  Resp: 16 20 20  (!) 23  Temp:  98.6 F (37 C)    TempSrc:  Oral    SpO2: 96% 93% 96% 98%  Weight:  75.8 kg    Height:        Intake/Output Summary (Last 24 hours) at 08/31/2019 1133 Last data filed at 08/31/2019 [id number] Gross per 24 hour  Intake 2301.04 ml  Output 1150 ml  Net 1151.04 ml   Filed Weights   08/29/19 1300 08/31/19 0357  Weight: 83.5 kg 75.8 kg    Exam:  General: NAD, acutely ill-appearing  Cardiovascular: S1, S2 present  Respiratory: CTAB  Abdomen: Soft, mildly tender, +distended, bowel sounds present  Musculoskeletal: Trace bilateral pedal edema noted  Skin: Normal  Psychiatry: Normal mood  Data Reviewed: CBC: Recent Labs  Lab 08/29/19 0004 08/29/19 0738 08/30/19 0744 08/31/19 0404  WBC 7.4 8.8 10.7* 10.8*  NEUTROABS 5.4  --   --   --   HGB 11.9* 11.6* 13.1 12.1  HCT 37.4 36.9 41.2 37.8  MCV 95.4 97.1 96.0 94.0  PLT 176 172 162 [id number]*   Basic Metabolic Panel: Recent Labs  Lab 08/29/19 0004 08/29/19 0738 08/30/19 0744 08/31/19 0404  NA 140 139 136 135  K 3.9 4.0 4.0 3.6  CL 106 104 102 103  CO2 23 24  21* 23  GLUCOSE 130* 159* 130* 107*  BUN 22 17 13 12   CREATININE 1.65* 1.44* 1.23* 1.21*  CALCIUM 9.5 9.2 9.0 8.8*   GFR: Estimated Creatinine Clearance: 34.4 mL/min (A) (by C-G formula based on SCr of 1.21 mg/dL (H)). Liver Function Tests: Recent Labs  Lab 08/29/19 0004  AST 16  ALT 14  ALKPHOS 81  BILITOT 0.6  PROT 7.0  ALBUMIN 3.9   Recent Labs  Lab 08/29/19 0004  LIPASE 30   No results for input(s): AMMONIA in the last 168 hours. Coagulation Profile: No results for input(s): INR, PROTIME in the last 168 hours. Cardiac Enzymes: No results for input(s): CKTOTAL, CKMB, CKMBINDEX, TROPONINI in the last 168 hours. BNP (last 3 results) No results for input(s): PROBNP in the last 8760 hours. HbA1C: No results for input(s): HGBA1C in the last 72 hours. CBG: Recent Labs  Lab 08/30/19 0753 08/30/19 1133 08/31/19 0433 08/31/19 0806 08/31/19 1114  GLUCAP 128* 133* 107* 125* 113*   Lipid Profile: No results for input(s): CHOL, HDL, LDLCALC, TRIG, CHOLHDL, LDLDIRECT in the last 72 hours. Thyroid Function Tests: No results for input(s): TSH, T4TOTAL, FREET4, T3FREE, THYROIDAB in the last 72 hours. Anemia Panel: No results for input(s): VITAMINB12, FOLATE, FERRITIN, TIBC, IRON, RETICCTPCT in the last 72 hours. Urine analysis: No results found for: COLORURINE, APPEARANCEUR, LABSPEC, PHURINE, GLUCOSEU, HGBUR, BILIRUBINUR, KETONESUR, PROTEINUR, UROBILINOGEN, NITRITE, LEUKOCYTESUR Sepsis Labs: @LABRCNTIP (procalcitonin:4,lacticidven:4)  ) Recent Results (from the past 240 hour(s))  SARS CORONAVIRUS 2 (TAT 6-24 HRS) Nasopharyngeal Nasopharyngeal Swab     Status: None   Collection Time: 08/29/19  5:30 AM   Specimen: Nasopharyngeal Swab  Result Value Ref Range Status   SARS Coronavirus 2 NEGATIVE NEGATIVE Final    Comment: (NOTE) SARS-CoV-2 target nucleic acids are NOT DETECTED. The SARS-CoV-2 RNA is generally detectable in upper and lower respiratory specimens during the  acute phase of infection. Negative results do not preclude SARS-CoV-2 infection, do not rule out co-infections with other pathogens, and should not be used as the sole basis for treatment or other patient management decisions. Negative results must be combined with clinical observations, patient history, and epidemiological information. The expected result is Negative. Fact Sheet for Patients: SugarRoll.be Fact Sheet for Healthcare Providers: https://www.woods-mathews.com/ This test is not yet approved or cleared by the [place] FDA and  has been authorized for detection and/or diagnosis of SARS-CoV-2 by FDA under an Emergency Use Authorization (EUA). This EUA will remain  in effect (meaning this test can be used) for the duration of the COVID-19 declaration under Section 56 4(b)(1) of the Act, 21 U.S.C. section 360bbb-3(b)(1), unless the authorization is terminated or revoked sooner. Performed at [facility], [place] [address] [place], [place] [address]       Studies: No results found.  Scheduled Meds: . chlorhexidine  15 mL Mouth Rinse BID  . latanoprost  1 drop Both Eyes QHS  . levothyroxine  50 mcg Intravenous Daily  .  mouth rinse  15 mL Mouth Rinse q12n4p  . pantoprazole (PROTONIX) IV  40 mg Intravenous Q24H    Continuous Infusions: . amiodarone 60 mg/hr (08/31/19 0858)   Followed by  . amiodarone    . heparin 1,250 Units/hr (08/31/19 0901)  . methocarbamol (ROBAXIN) IV       LOS: 2 days     Alma Friendly, MD [facility]  If 7PM-7AM, please contact night-coverage [email or web address] 08/31/2019, 11:33 AM

## 2019-09-01 ENCOUNTER — Encounter (HOSPITAL_COMMUNITY): Payer: Self-pay | Admitting: Certified Registered Nurse Anesthetist

## 2019-09-01 ENCOUNTER — Inpatient Hospital Stay (HOSPITAL_COMMUNITY): Payer: Medicare Other | Admitting: Anesthesiology

## 2019-09-01 ENCOUNTER — Encounter (HOSPITAL_COMMUNITY)
Admission: EM | Disposition: A | Discharge status: Skilled Nursing Facility | Payer: Self-pay | Source: Home / Self Care | Attending: Internal Medicine

## 2019-09-01 DIAGNOSIS — I4891 Unspecified atrial fibrillation: Secondary | ICD-10-CM

## 2019-09-01 HISTORY — PX: LAPAROSCOPIC NISSEN FUNDOPLICATION: SHX1932

## 2019-09-01 HISTORY — PX: LAPAROSCOPIC LYSIS OF ADHESIONS: SHX5905

## 2019-09-01 HISTORY — PX: GASTROSTOMY: SHX5249

## 2019-09-01 LAB — CBC
HCT: 37.5 % (ref 36.0–46.0)
Hemoglobin: 12.3 g/dL (ref 12.0–15.0)
MCH: 30.6 pg (ref 26.0–34.0)
MCHC: 32.8 g/dL (ref 30.0–36.0)
MCV: 93.3 fL (ref 80.0–100.0)
Platelets: 129 10*3/uL — ABNORMAL LOW (ref 150–400)
RBC: 4.02 MIL/uL (ref 3.87–5.11)
RDW: 13 % (ref 11.5–15.5)
WBC: 12.3 10*3/uL — ABNORMAL HIGH (ref 4.0–10.5)
nRBC: 0 % (ref 0.0–0.2)

## 2019-09-01 LAB — BASIC METABOLIC PANEL
Anion gap: 10 (ref 5–15)
BUN: 11 mg/dL (ref 8–23)
CO2: 22 mmol/L (ref 22–32)
Calcium: 8.6 mg/dL — ABNORMAL LOW (ref 8.9–10.3)
Chloride: 103 mmol/L (ref 98–111)
Creatinine, Ser: 1.34 mg/dL — ABNORMAL HIGH (ref 0.44–1.00)
GFR calc Af Amer: 42 mL/min — ABNORMAL LOW (ref >60)
GFR calc non Af Amer: 37 mL/min — ABNORMAL LOW (ref >60)
Glucose, Bld: 117 mg/dL — ABNORMAL HIGH (ref 70–99)
Potassium: 3.2 mmol/L — ABNORMAL LOW (ref 3.5–5.1)
Sodium: 135 mmol/L (ref 135–145)

## 2019-09-01 LAB — GLUCOSE, CAPILLARY: Glucose-Capillary: 127 mg/dL — ABNORMAL HIGH (ref 70–99)

## 2019-09-01 LAB — APTT: aPTT: 70 seconds — ABNORMAL HIGH (ref 24–36)

## 2019-09-01 LAB — SURGICAL PCR SCREEN
MRSA, PCR: NEGATIVE
Staphylococcus aureus: NEGATIVE

## 2019-09-01 LAB — HEPARIN LEVEL (UNFRACTIONATED): Heparin Unfractionated: 0.49 IU/mL (ref 0.30–0.70)

## 2019-09-01 SURGERY — INSERTION OF GASTROSTOMY TUBE
Anesthesia: General | Site: Abdomen

## 2019-09-01 MED ORDER — SUCCINYLCHOLINE CHLORIDE 200 MG/10ML IV SOSY
PREFILLED_SYRINGE | INTRAVENOUS | Status: DC | PRN
  Administered 2019-09-01: 120 mg via INTRAVENOUS

## 2019-09-01 MED ORDER — ROCURONIUM BROMIDE 50 MG/5ML IV SOSY
PREFILLED_SYRINGE | INTRAVENOUS | Status: DC | PRN
  Administered 2019-09-01: 50 mg via INTRAVENOUS
  Administered 2019-09-01: 10 mg via INTRAVENOUS

## 2019-09-01 MED ORDER — PHENYLEPHRINE HCL-NACL 10-0.9 MG/250ML-% IV SOLN
INTRAVENOUS | Status: DC | PRN
  Administered 2019-09-01: 25 ug/min via INTRAVENOUS

## 2019-09-01 MED ORDER — 0.9 % SODIUM CHLORIDE (POUR BTL) OPTIME
TOPICAL | Status: DC | PRN
  Administered 2019-09-01 (×2): 1000 mL

## 2019-09-01 MED ORDER — FENTANYL CITRATE (PF) 100 MCG/2ML IJ SOLN
25.0000 ug | INTRAMUSCULAR | Status: DC | PRN
  Administered 2019-09-01: 25 ug via INTRAVENOUS

## 2019-09-01 MED ORDER — DEXAMETHASONE SODIUM PHOSPHATE 10 MG/ML IJ SOLN
INTRAMUSCULAR | Status: DC | PRN
  Administered 2019-09-01: 10 mg via INTRAVENOUS

## 2019-09-01 MED ORDER — FENTANYL CITRATE (PF) 100 MCG/2ML IJ SOLN
INTRAMUSCULAR | Status: AC
  Filled 2019-09-01: qty 2

## 2019-09-01 MED ORDER — CIPROFLOXACIN IN D5W 400 MG/200ML IV SOLN
400.0000 mg | INTRAVENOUS | Status: AC
  Administered 2019-09-01: 400 mg via INTRAVENOUS
  Filled 2019-09-01: qty 200

## 2019-09-01 MED ORDER — MEPERIDINE HCL 25 MG/ML IJ SOLN: 6.2500 mg | INTRAMUSCULAR | Status: DC | PRN

## 2019-09-01 MED ORDER — MIDAZOLAM HCL 2 MG/2ML IJ SOLN
INTRAMUSCULAR | Status: AC
  Filled 2019-09-01: qty 2

## 2019-09-01 MED ORDER — ALBUMIN HUMAN 5 % IV SOLN
INTRAVENOUS | Status: DC | PRN
  Administered 2019-09-01: 17:00:00 via INTRAVENOUS

## 2019-09-01 MED ORDER — SUGAMMADEX SODIUM 200 MG/2ML IV SOLN
INTRAVENOUS | Status: DC | PRN
  Administered 2019-09-01: 150 mg via INTRAVENOUS

## 2019-09-01 MED ORDER — BUPIVACAINE HCL (PF) 0.25 % IJ SOLN
INTRAMUSCULAR | Status: AC
  Filled 2019-09-01: qty 30

## 2019-09-01 MED ORDER — FENTANYL CITRATE (PF) 250 MCG/5ML IJ SOLN
INTRAMUSCULAR | Status: DC | PRN
  Administered 2019-09-01: 150 ug via INTRAVENOUS

## 2019-09-01 MED ORDER — PHENYLEPHRINE HCL (PRESSORS) 10 MG/ML IV SOLN
INTRAVENOUS | Status: DC | PRN
  Administered 2019-09-01: 80 ug via INTRAVENOUS

## 2019-09-01 MED ORDER — BUPIVACAINE HCL 0.25 % IJ SOLN
INTRAMUSCULAR | Status: DC | PRN
  Administered 2019-09-01: 8 mL

## 2019-09-01 MED ORDER — PROPOFOL 10 MG/ML IV BOLUS
INTRAVENOUS | Status: DC | PRN
  Administered 2019-09-01: 80 mg via INTRAVENOUS

## 2019-09-01 MED ORDER — WATER FOR IRRIGATION, STERILE IR SOLN
Status: DC | PRN
  Administered 2019-09-01: 1000 mL

## 2019-09-01 MED ORDER — POTASSIUM CHLORIDE 10 MEQ/100ML IV SOLN
10.0000 meq | INTRAVENOUS | Status: AC
  Administered 2019-09-01 (×4): 10 meq via INTRAVENOUS
  Filled 2019-09-01: qty 100

## 2019-09-01 MED ORDER — VASOPRESSIN 20 UNIT/ML IV SOLN
INTRAVENOUS | Status: AC
  Filled 2019-09-01: qty 1

## 2019-09-01 MED ORDER — ONDANSETRON HCL 4 MG/2ML IJ SOLN
INTRAMUSCULAR | Status: DC | PRN
  Administered 2019-09-01: 4 mg via INTRAVENOUS

## 2019-09-01 MED ORDER — FENTANYL CITRATE (PF) 250 MCG/5ML IJ SOLN
INTRAMUSCULAR | Status: AC
  Filled 2019-09-01: qty 5

## 2019-09-01 MED ORDER — LACTATED RINGERS IV SOLN
INTRAVENOUS | Status: DC | PRN
  Administered 2019-09-01 (×2): via INTRAVENOUS

## 2019-09-01 MED ORDER — ONDANSETRON HCL 4 MG/2ML IJ SOLN: 4.0000 mg | Freq: Once | INTRAMUSCULAR | Status: DC | PRN

## 2019-09-01 MED ORDER — HEPARIN (PORCINE) 25000 UT/250ML-% IV SOLN
1500.0000 [IU]/h | INTRAVENOUS | Status: AC
  Administered 2019-09-02: 1250 [IU]/h via INTRAVENOUS
  Administered 2019-09-03: 1600 [IU]/h via INTRAVENOUS
  Administered 2019-09-03: 1350 [IU]/h via INTRAVENOUS
  Administered 2019-09-04: 1700 [IU]/h via INTRAVENOUS
  Administered 2019-09-05 – 2019-09-06 (×2): 1500 [IU]/h via INTRAVENOUS
  Filled 2019-09-01 (×7): qty 250

## 2019-09-01 MED ORDER — DEXTROSE-NACL 5-0.9 % IV SOLN
INTRAVENOUS | Status: AC
  Administered 2019-09-01: 22:00:00 via INTRAVENOUS

## 2019-09-01 SURGICAL SUPPLY — 85 items
APPLIER CLIP ROT 10 11.4 M/L (STAPLE)
BAG URINE DRAIN 2000ML AR STRL (UROLOGICAL SUPPLIES) ×1 IMPLANT
BAG URINE DRAINAGE (UROLOGICAL SUPPLIES) ×3 IMPLANT
BLADE CLIPPER SURG (BLADE) IMPLANT
CANISTER SUCT 3000ML PPV (MISCELLANEOUS) ×3 IMPLANT
CATH GASTROSTOMY 20FR (CATHETERS) ×1 IMPLANT
CELLS DAT CNTRL 66122 CELL SVR (MISCELLANEOUS) IMPLANT
CHLORAPREP W/TINT 26 (MISCELLANEOUS) ×3 IMPLANT
CLIP APPLIE ROT 10 11.4 M/L (STAPLE) IMPLANT
COVER MAYO STAND STRL (DRAPES) ×3 IMPLANT
COVER SURGICAL LIGHT HANDLE (MISCELLANEOUS) ×6 IMPLANT
COVER WAND RF STERILE (DRAPES) ×3 IMPLANT
DERMABOND ADVANCED (GAUZE/BANDAGES/DRESSINGS) ×1
DERMABOND ADVANCED .7 DNX12 (GAUZE/BANDAGES/DRESSINGS) IMPLANT
DEVICE SUTURE ENDOST 10MM (ENDOMECHANICALS) ×1 IMPLANT
DRAPE LAPAROSCOPIC ABDOMINAL (DRAPES) ×3 IMPLANT
DRAPE UTILITY XL STRL (DRAPES) ×3 IMPLANT
DRAPE WARM FLUID 44X44 (DRAPES) ×3 IMPLANT
DRSG OPSITE POSTOP 4X10 (GAUZE/BANDAGES/DRESSINGS) IMPLANT
DRSG OPSITE POSTOP 4X8 (GAUZE/BANDAGES/DRESSINGS) IMPLANT
ELECT BLADE 6.5 EXT (BLADE) ×3 IMPLANT
ELECT CAUTERY BLADE 6.4 (BLADE) ×6 IMPLANT
ELECT REM PT RETURN 9FT ADLT (ELECTROSURGICAL) ×3
ELECTRODE REM PT RTRN 9FT ADLT (ELECTROSURGICAL) ×2 IMPLANT
GAUZE SPONGE 2X2 8PLY STRL LF (GAUZE/BANDAGES/DRESSINGS) ×2 IMPLANT
GAUZE SPONGE 4X4 12PLY STRL (GAUZE/BANDAGES/DRESSINGS) ×3 IMPLANT
GAUZE SPONGE 4X4 12PLY STRL LF (GAUZE/BANDAGES/DRESSINGS) ×1 IMPLANT
GEL ULTRASOUND 20GR AQUASONIC (MISCELLANEOUS) IMPLANT
GLOVE BIO SURGEON STRL SZ8 (GLOVE) ×6 IMPLANT
GLOVE BIOGEL PI IND STRL 6.5 (GLOVE) IMPLANT
GLOVE BIOGEL PI IND STRL 8 (GLOVE) ×4 IMPLANT
GLOVE BIOGEL PI INDICATOR 6.5 (GLOVE) ×1
GLOVE BIOGEL PI INDICATOR 8 (GLOVE) ×2
GOWN STRL REUS W/ TWL LRG LVL3 (GOWN DISPOSABLE) ×12 IMPLANT
GOWN STRL REUS W/ TWL XL LVL3 (GOWN DISPOSABLE) ×4 IMPLANT
GOWN STRL REUS W/TWL LRG LVL3 (GOWN DISPOSABLE) ×6
GOWN STRL REUS W/TWL XL LVL3 (GOWN DISPOSABLE) ×2
HANDLE SUCTION POOLE (INSTRUMENTS) ×2 IMPLANT
KIT BASIN OR (CUSTOM PROCEDURE TRAY) ×3 IMPLANT
KIT TURNOVER KIT B (KITS) ×3 IMPLANT
LIGASURE IMPACT 36 18CM CVD LR (INSTRUMENTS) IMPLANT
NS IRRIG 1000ML POUR BTL (IV SOLUTION) ×6 IMPLANT
PACK GENERAL/GYN (CUSTOM PROCEDURE TRAY) ×3 IMPLANT
PAD ARMBOARD 7.5X6 YLW CONV (MISCELLANEOUS) ×6 IMPLANT
PENCIL SMOKE EVACUATOR (MISCELLANEOUS) ×6 IMPLANT
RELOAD ENDO STITCH (ENDOMECHANICALS) ×18 IMPLANT
RELOAD SUT TRIPLE-STITCH 2-0 (ENDOMECHANICALS) IMPLANT
RETRACTOR WND ALEXIS 18 MED (MISCELLANEOUS) IMPLANT
RTRCTR WOUND ALEXIS 18CM MED (MISCELLANEOUS)
SCISSORS LAP 5X35 DISP (ENDOMECHANICALS) ×3 IMPLANT
SET IRRIG TUBING LAPAROSCOPIC (IRRIGATION / IRRIGATOR) IMPLANT
SET TUBE SMOKE EVAC HIGH FLOW (TUBING) ×3 IMPLANT
SHEARS HARMONIC ACE PLUS 36CM (ENDOMECHANICALS) ×1 IMPLANT
SLEEVE ENDOPATH XCEL 5M (ENDOMECHANICALS) ×4 IMPLANT
SPECIMEN JAR LARGE (MISCELLANEOUS) ×3 IMPLANT
SPONGE GAUZE 2X2 STER 10/PKG (GAUZE/BANDAGES/DRESSINGS) ×1
STAPLER VISISTAT 35W (STAPLE) ×3 IMPLANT
SUCTION POOLE HANDLE (INSTRUMENTS) ×3
SUT ETHILON 2 0 FS 18 (SUTURE) ×4 IMPLANT
SUT PDS AB 0 CT 36 (SUTURE) ×6 IMPLANT
SUT PDS AB 1 TP1 96 (SUTURE) ×6 IMPLANT
SUT SILK 2 0 TIES 10X30 (SUTURE) ×3 IMPLANT
SUT SILK 3 0 TIES 10X30 (SUTURE) ×3 IMPLANT
SUT VIC AB 2-0 SH 18 (SUTURE) ×6 IMPLANT
SUT VIC AB 3-0 SH 18 (SUTURE) ×3 IMPLANT
SUT VICRYL AB 2 0 TIES (SUTURE) ×3 IMPLANT
SUT VICRYL AB 3 0 TIES (SUTURE) ×3 IMPLANT
SYR 10ML LL (SYRINGE) ×3 IMPLANT
SYR 20ML LL LF (SYRINGE) ×3 IMPLANT
SYR BULB IRRIGATION 50ML (SYRINGE) ×3 IMPLANT
SYRINGE TOOMEY DISP (SYRINGE) ×3 IMPLANT
SYS LAPSCP GELPORT 120MM (MISCELLANEOUS)
SYSTEM LAPSCP GELPORT 120MM (MISCELLANEOUS) IMPLANT
TOWEL GREEN STERILE (TOWEL DISPOSABLE) ×6 IMPLANT
TOWEL GREEN STERILE FF (TOWEL DISPOSABLE) ×3 IMPLANT
TRAY FOLEY MTR SLVR 16FR STAT (SET/KITS/TRAYS/PACK) ×3 IMPLANT
TRAY LAPAROSCOPIC MC (CUSTOM PROCEDURE TRAY) ×3 IMPLANT
TROCAR BLADELESS 11MM (ENDOMECHANICALS) ×1 IMPLANT
TROCAR XCEL BLUNT TIP 100MML (ENDOMECHANICALS) IMPLANT
TROCAR XCEL NON-BLD 11X100MML (ENDOMECHANICALS) IMPLANT
TROCAR XCEL NON-BLD 5MMX100MML (ENDOMECHANICALS) ×4 IMPLANT
TUBE CONNECTING 12X1/4 (SUCTIONS) ×6 IMPLANT
TUBE MIC GASTROSTOMY 16FR (TUBING) IMPLANT
WATER STERILE IRR 1000ML POUR (IV SOLUTION) ×3 IMPLANT
YANKAUER SUCT BULB TIP NO VENT (SUCTIONS) ×6 IMPLANT

## 2019-09-01 NOTE — Interval H&P Note (Signed)
History and Physical Interval Note:  09/01/2019 2:59 PM  Alyssa Odonnell  has presented today for surgery, with the diagnosis of Hiatal Hernia.  The various methods of treatment have been discussed with the patient and family. After consideration of risks, benefits and other options for treatment, the patient has consented to  Procedure(s): INSERTION OF GASTROSTOMY TUBE (N/A) LAPAROSCOPIC, POSSIBLE OPEN, GASTROPEXY (N/A) as a surgical intervention.  The patient's history has been reviewed, patient examined, no change in status, stable for surgery.  I have reviewed the patient's chart and labs.  Questions were answered to the patient's satisfaction.   Discussed open surgery as well Unfortunately no other options available for her since she is obstructed  High operative risk for complications.    Justice

## 2019-09-01 NOTE — Anesthesia Preprocedure Evaluation (Addendum)
Anesthesia Evaluation  Patient identified by MRN, date of birth, ID band Patient awake    Reviewed: Allergy & Precautions, NPO status , Patient's Chart, lab work & pertinent test results  Airway Mallampati: II  TM Distance: >3 FB Neck ROM: Full    Dental  (+) Dental Advisory Given   Pulmonary neg pulmonary ROS,    Pulmonary exam normal breath sounds clear to auscultation       Cardiovascular hypertension, Pt. on medications + Peripheral Vascular Disease  Normal cardiovascular exam+ dysrhythmias Atrial Fibrillation  Rhythm:Regular Rate:Normal  Echo 02/2017 Left ventricle: The cavity size was normal. Systolic function was normal. The estimated ejection fraction was in the range of 60% to 65%. Wall motion was normal; there were no regional wall motion abnormalities. Left ventricular diastolic function parameters were normal. - Aortic valve: There was very mild stenosis. There was no regurgitation. Peak velocity (S): 211 cm/s. Mean gradient (S): 10 mm Hg. Valve area (VTI): 1.49 cm^2. Valve area (Vmax): 1.36 cm^2. Valve area (Vmean): 1.34 cm^2. - Mitral valve: Transvalvular velocity was within the normal range. There was no evidence for stenosis. There was no regurgitation. - Right ventricle: The cavity size was normal. Wall thickness was normal. Systolic function was normal. - Atrial septum: No defect or patent foramen ovale was identified  by color flow Doppler. - Tricuspid valve: There was mild regurgitation. - Pulmonary arteries: Systolic pressure was within the normal range. PA peak pressure: 23 mm Hg (S).   Neuro/Psych negative neurological ROS  negative psych ROS   GI/Hepatic Neg liver ROS, Gastric outlet obstruction   Endo/Other  Hypothyroidism   Renal/GU negative Renal ROS  negative genitourinary   Musculoskeletal negative musculoskeletal ROS (+)   Abdominal   Peds negative pediatric ROS (+)  Hematology negative  hematology ROS (+)   Anesthesia Other Findings HLD  Reproductive/Obstetrics negative OB ROS                           Anesthesia Physical Anesthesia Plan  ASA: III  Anesthesia Plan: General   Post-op Pain Management:    Induction: Intravenous, Rapid sequence and Cricoid pressure planned  PONV Risk Score and Plan: 4 or greater and Ondansetron, Dexamethasone and Treatment may vary due to age or medical condition  Airway Management Planned: Oral ETT  Additional Equipment: None  Intra-op Plan:   Post-operative Plan: Possible Post-op intubation/ventilation  Informed Consent: I have reviewed the patients History and Physical, chart, labs and discussed the procedure including the risks, benefits and alternatives for the proposed anesthesia with the patient or authorized representative who has indicated his/her understanding and acceptance.     Dental advisory given  Plan Discussed with: CRNA  Anesthesia Plan Comments:        Anesthesia Quick Evaluation

## 2019-09-01 NOTE — Anesthesia Procedure Notes (Signed)
Procedure Name: Intubation Date/Time: 09/01/2019 3:44 PM Performed by: Shirlyn Goltz, CRNA Pre-anesthesia Checklist: Patient identified, Emergency Drugs available, Suction available and Patient being monitored Patient Re-evaluated:Patient Re-evaluated prior to induction Oxygen Delivery Method: Circle system utilized Preoxygenation: Pre-oxygenation with 100% oxygen Induction Type: IV induction, Rapid sequence and Cricoid Pressure applied Laryngoscope Size: Glidescope and 3 Grade View: Grade I Tube type: Oral Tube size: 7.0 mm Number of attempts: 1 Airway Equipment and Method: Stylet Placement Confirmation: ETT inserted through vocal cords under direct vision,  positive ETCO2 and breath sounds checked- equal and bilateral Secured at: 22 cm Tube secured with: Tape Dental Injury: Teeth and Oropharynx as per pre-operative assessment

## 2019-09-01 NOTE — Transfer of Care (Signed)
Immediate Anesthesia Transfer of Care Note  Patient: Alyssa Odonnell  Procedure(s) Performed: INSERTION OF GASTROSTOMY TUBE (N/A ) LAPAROSCOPIC, POSSIBLE OPEN, GASTROPEXY (N/A ) Laparoscopic Lysis Of Adhesions (Abdomen)  Patient Location: PACU  Anesthesia Type:General  Level of Consciousness: drowsy and pateint uncooperative  Airway & Oxygen Therapy: Patient Spontanous Breathing and Patient connected to face mask oxygen  Post-op Assessment: Report given to RN and Post -op Vital signs reviewed and stable  Post vital signs: Reviewed and stable  Last Vitals:  Vitals Value Taken Time  BP 136/84 09/01/19 1811  Temp    Pulse 115 09/01/19 1812  Resp 26 09/01/19 1812  SpO2 96 % 09/01/19 1812  Vitals shown include unvalidated device data.  Last Pain:  Vitals:   09/01/19 0400  TempSrc: Oral  PainSc:       Patients Stated Pain Goal: 0 ([id number])  Complications: No apparent anesthesia complications

## 2019-09-01 NOTE — Progress Notes (Addendum)
Progress Note  Patient Name: Alyssa Odonnell Date of Encounter: 09/01/2019  Primary Cardiologist: Cristopher Peru, MD   Subjective   Feeling miserable this morning. Had an episode of emesis despite NG tube while in the room - non-bloody, brown material. Denies palpitations, chest pain, or SOB.   Inpatient Medications    Scheduled Meds: . chlorhexidine  15 mL Mouth Rinse BID  . latanoprost  1 drop Both Eyes QHS  . levothyroxine  50 mcg Intravenous Daily  . mouth rinse  15 mL Mouth Rinse q12n4p  . pantoprazole (PROTONIX) IV  40 mg Intravenous Q24H   Continuous Infusions: . amiodarone 30 mg/hr (09/01/19 0600)  . dextrose 5 % and 0.9% NaCl 75 mL/hr at 09/01/19 0600  . heparin 1,250 Units/hr (09/01/19 0600)  . methocarbamol (ROBAXIN) IV     PRN Meds: acetaminophen **OR** acetaminophen, hydrALAZINE, methocarbamol (ROBAXIN) IV, morphine injection, ondansetron **OR** ondansetron (ZOFRAN) IV, promethazine   Vital Signs    Vitals:   08/31/19 2100 09/01/19 0400 09/01/19 0500 09/01/19 0600  BP:  95/65 118/66 130/74  Pulse: (!) 146 91 (!) 105 (!) 109  Resp: (!) 22 (!) 22 (!) 25 (!) 30  Temp: (!) 97.5 F (36.4 C) 97.8 F (36.6 C)    TempSrc: Axillary Oral    SpO2: 97% 98% 94% 94%  Weight:  76.8 kg    Height:        Intake/Output Summary (Last 24 hours) at 09/01/2019 0655 Last data filed at 09/01/2019 0600 Gross per 24 hour  Intake 2215.9 ml  Output -  Net 2215.9 ml   Filed Weights   08/29/19 1300 08/31/19 0357 09/01/19 0400  Weight: 83.5 kg 75.8 kg 76.8 kg    Telemetry    Appears to have intermittent episodes of atrial fibrillation and sinus rhythm with bigeminy with rates in the 100s-110s - Personally Reviewed  ECG    No knew tracings  Physical Exam   GEN: Sitting upright in bed in no acute distress.   Neck: No JVD, no carotid bruits Cardiac: IRIR, +murmur, no rubs or gallops.  Respiratory: Clear to auscultation bilaterally, no wheezes/ rales/ rhonchi GI:  NABS, Soft, nontender, non-distended  MS: No edema; No deformity. Neuro:  Nonfocal, moving all extremities spontaneously Psych: Normal affect   Labs    Chemistry Recent Labs  Lab 08/29/19 0004  08/30/19 0744 08/31/19 0404 09/01/19 0359  NA 140   < > 136 135 135  K 3.9   < > 4.0 3.6 3.2*  CL 106   < > 102 103 103  CO2 23   < > 21* 23 22  GLUCOSE 130*   < > 130* 107* 117*  BUN 22   < > 13 12 11   CREATININE 1.65*   < > 1.23* 1.21* 1.34*  CALCIUM 9.5   < > 9.0 8.8* 8.6*  PROT 7.0  --   --   --   --   ALBUMIN 3.9  --   --   --   --   AST 16  --   --   --   --   ALT 14  --   --   --   --   ALKPHOS 81  --   --   --   --   BILITOT 0.6  --   --   --   --   GFRNONAA 28*   < > 41* 41* 37*  GFRAA 33*   < > 47* 48* 42*  ANIONGAP 11   < > 13 9 10    < > = values in this interval not displayed.     Hematology Recent Labs  Lab 08/30/19 0744 08/31/19 0404 09/01/19 0359  WBC 10.7* 10.8* 12.3*  RBC 4.29 4.02 4.02  HGB 13.1 12.1 12.3  HCT 41.2 37.8 37.5  MCV 96.0 94.0 93.3  MCH 30.5 30.1 30.6  MCHC 31.8 32.0 32.8  RDW 13.1 13.0 13.0  PLT 162 140* 129*    Cardiac EnzymesNo results for input(s): TROPONINI in the last 168 hours. No results for input(s): TROPIPOC in the last 168 hours.   BNPNo results for input(s): BNP, PROBNP in the last 168 hours.   DDimer No results for input(s): DDIMER in the last 168 hours.   Radiology    Dg Abd 1 View  Result Date: 08/31/2019 CLINICAL DATA:  NG tube placement. EXAM: ABDOMEN - 1 VIEW COMPARISON:  08/21/2019 FINDINGS: The nasogastric tube is been retracted slightly since the prior exam. It is coiled within the known intrathoracic stomach. Cardiac leads and tubing over the chest. Cardiomediastinal contours are unchanged. IMPRESSION: The nasogastric tube is been retracted slightly since the prior exam. It is coiled within the known intrathoracic stomach. No other interval changes on this limited fluoroscopic assessment. Electronically Signed    By: Zetta Bills M.D.   On: 08/31/2019 17:30    Cardiac Studies   Echo 2018  Left ventricle: The cavity size was normal. Systolic function was normal. The estimated ejection fraction was in the range of 60% to 65%. Wall motion was normal; there were no regional wall motion abnormalities. Left ventricular diastolic function parameters were normal. - Aortic valve: There was very mild stenosis. There was no regurgitation. Peak velocity (S): 211 cm/s. Mean gradient (S): 10 mm Hg. Valve area (VTI): 1.49 cm^2. Valve area (Vmax): 1.36 cm^2. Valve area (Vmean): 1.34 cm^2. - Mitral valve: Transvalvular velocity was within the normal range. There was no evidence for stenosis. There was no regurgitation. - Right ventricle: The cavity size was normal. Wall thickness was normal. Systolic function was normal. - Atrial septum: No defect or patent foramen ovale was identified by color flow Doppler. - Tricuspid valve: There was mild regurgitation. - Pulmonary arteries: Systolic pressure was within the normal range. PA peak pressure: 23 mm Hg (S).  Patient Profile     85 y.o. female with a hx of HTN, PAf on Eliquis, CVA, HLD, CKD III, mild AS, hypothyroidism who was seen by cardiology for surgical clearance for surgery for hiatal hernia.  Assessment & Plan    1. Paroxysmal atrial fibrillation with RVR: hospital course has been complicated by Afib RVR with rates generally in the 110s over the past 24 hours. She was initially started on a diltiazem gtt and transitioned to amiodarone gtt due to hypotension. She is awaiting hiatal hernia surgery for management of gastric outlet obstruction, though surgery on hold until HR's improved, though suspect her rates would improve with management of the underlying condition.  - Continue amiodarone gtt - Continue heparin gtt - home eliquis on hold for possible surgery  2. Preoperative assessment: patient presented with abdominal pain,  found to have a large hiatal hernia with gastric outlet obstruction. Awaiting hiatal hernia surgery. Felt to be low risk to proceed without further cardiac work-up. Now with afib RVR which is delaying surgery.  - Patient can proceed with surgery - suspect Afib will improve after management of the underlying condition - Monitor volume status closely in the  perioperative setting  3. HTN: BP soft. Home benicar on hold - Continue to monitor closely in the perioperative setting  4. Aortic Stenosis: mild on echo 2018. No complaints of chest pain or syncope to suggest progression of disease - Will continue to monitor routinely outpatient  For questions or updates, please contact [place] Please consult [email or web address] for contact info under Cardiology/STEMI.     Signed, Abigail Butts, PA-C  09/01/2019, 6:55 AM   [phone]  The patient was seen, examined and discussed with Abigail Butts, PA-C  and I agree with the above.   The patient remains in atrial fibrillation with ventricular rates from 105 to 135 bpm, she is going for her surgery today hopefully this will help her and we will L able to control her rates better.  For now I would continue amiodarone drip, we will follow post surgery.  Ena Dawley, MD 09/01/2019

## 2019-09-01 NOTE — Op Note (Signed)
Preoperative diagnosis: Gastric outlet obstruction secondary to large hiatal hernia  Postoperative diagnosis: Same  Procedure: Laparoscopic gastropexy with placement of 20 French gastrostomy tube with extensive lysis of adhesions  Surgeon: Erroll Luna, MD  Assistant:. Alferd Apa  PA  Anesthesia: General with 0.5% Marcaine local  EBL: Minimal  Specimens: None  IV fluids: Per anesthesia record  Indications for procedure: The patient is an 85 year old female who has a longstanding history of a hiatal hernia with her stomach being up in her right chest.  She is readmitted for gastric outlet obstruction she has had progression of the stomach into the chest.  She is of marginal health and I recommended a gastropexy for her to hopefully relieve her obstruction and provide feeding access.  She was having issues with atrial fibrillation and this was controlled with the medical service.  She presents today for laparoscopic gastropexy for her large hiatal hernia and gastric  Obstruction.   The procedure has been discussed with the patient.  Alternative therapies have been discussed with the patient.  Operative risks include bleeding,  Infection,  Organ injury,  Nerve injury,  Blood vessel injury,  DVT,  Pulmonary embolism,  Death,  And possible reoperation.  Medical management risks include worsening of present situation.  The success of the procedure is 50 -90 % at treating patients symptoms.  The patient understands and agrees to proceed.  Description of procedure: The patient was met in the holding area and the procedure was reviewed.  Risk, benefits and expectations and long-term outcomes discussed.  She agreed to proceed.  She was taken back the operating.  She is placed upon the OR table.  After induction of general esthesia Foley catheter was placed in the right arm was tucked.  After induction of general stage of the abdomen was prepped draped sterile fashion.  Timeout was performed.  We  placed THREE 5 mm ports 1 in the left upper quadrant, left lower quadrant in the right lower quadrant.  Optiview was used for the first port and the subsequent other 2 were placed under laparoscopic vision.  Additional 10 mm port was placed in the lower midline below the umbilicus.  He had extensive adhesions from previous ventral hernia.  Mesh.  These were taken down with a harmonic scalpel.  I then elevated liver.  The stomach was mostly up into the chest.  I carefully begin to reduce the stomach hand overhand out of the hiatus.  I was able to reduce about 80% of the stomach back into the abdominal cavity.  The point of obstruction was visualized and we were able to reduce that back into the stomach.  I opted to do a gastropexy on her since she is of poor health and not a good candidate for a hiatal hernia repair.  We used #1 Ethibond sutures and 3 states to suture placed in the mid gastric body for gastrostomy tube placement.  These were then pulled up using the suture passer to pull the sutures up into the abdominal wall.  I then used the harmonic scalpel to do a gastrotomy.  A 20 French feeding tube was used and tested.  It was inserted into the stomach under direct vision.  The balloon was insufflated and pulled up snugly to the gastric wall without undue tension.  I then pulled the sutures up to pexed the stomach to the anterior abdominal wall without any undue twisting.  We tied the sutures down to secure the stomach to the anterior abdominal  wall.  Gastric contents were suctioned out of the gastrostomy tube.  Hemostasis achieved.  We then slowly deinsufflated the abdominal cavity.  The tube sat nicely under the anterior abdominal wall.  This was placed to gravity.  The tube was secured to the skin with 2-0 nylon.  All ports were removed.  Port sites were closed with 4-0 Monocryl.  Dermabond applied.  All final counts were found to be correct.  Patient was awoke extubated taken recovery in satisfactory  condition.

## 2019-09-01 NOTE — Progress Notes (Signed)
PROGRESS NOTE  Alyssa Odonnell [id number] DOB: 10-Jan-1934 DOA: 08/28/2019 PCP: Drake Leach, MD  HPI/Recap of past 24 hours: HPI from [name] 85 year old female, lives with her daughter, ambulates with the help of a walker, PMH of PAF on Eliquis, embolic CVA, HTN, HLD, hypothyroid, PAD, cholecystectomy, presented to [facility] ED on 08/28/2019 due to abrupt onset of severe nausea, dry heaving without vomiting, chest/epigastric abdominal pain.  CT abdomen showed large hiatal hernia with gastric volvulus with gastric outlet obstruction.  Multiple unsuccessful attempts to pass NG tube at bedside.  IR consulted for NG tube under fluoroscopy.  General surgery consulted and have tentatively plan for OR on 11/12, if no improvement with medical management.   Patient noted to have emesis despite having NG tube.  Patient reports no improvement overall in symptoms.  Still in A. fib with RVR.  Denies any chest pain, abdominal pain, fever/chills.   Assessment/Plan: Principal Problem:   Volvulus of stomach Active Problems:   PAD (peripheral artery disease) (HCC)   Essential hypertension   PAF (paroxysmal atrial fibrillation) (HCC)   Gastric outlet obstruction  Large hiatal hernia with gastric outlet obstruction Afebrile, with leukocytosis CT abdomen/pelvis on 11/10 showed above Status post NG tube, bowel rest, IV fluids, pain management General surgery on board, for surgery today  Hypokalemia Replace as needed Daily BMP  Thrombocytopenia Likely due to acute illness Daily CBC  Paroxysmal A. fib with RVR Heart rate noted to be in the 120s EKG showed A. fib TTE on 02/2017 showed LVEF of 60 to 65% Patient was initially started on diltiazem drip, then switched to amiodarone drip due to hypotension Continue amiodarone drip Continue IV Heparin Hold home Eliquis Cardiology consulted, appreciate recs  Hypertension BP soft Hold home Benicar, hydrochlorothiazide Monitor closely   Hypothyroidism Continue IV Synthroid  CKD stage IIIb Creatinine at baseline Daily BMP       Malnutrition Type:      Malnutrition Characteristics:      Nutrition Interventions:       Estimated body mass index is 29.99 kg/m as calculated from the following:   Height as of this encounter: 5\' 3"  (1.6 m).   Weight as of this encounter: 76.8 kg.     Code Status: Full  Family Communication: None at bedside  Disposition Plan: To be determined   Consultants:  General surgery  Cardiology  Procedures:  None  Antimicrobials:  None  DVT prophylaxis: IV Heparin   Objective: Vitals:   09/01/19 0400 09/01/19 0500 09/01/19 0600 09/01/19 0700  BP: 95/65 118/66 130/74 129/76  Pulse: 91 (!) 105 (!) 109 82  Resp: (!) 22 (!) 25 (!) 30 (!) 23  Temp: 97.8 F (36.6 C)     TempSrc: Oral     SpO2: 98% 94% 94% 96%  Weight: 76.8 kg     Height:        Intake/Output Summary (Last 24 hours) at 09/01/2019 1432 Last data filed at 09/01/2019 0941 Gross per 24 hour  Intake 2215.9 ml  Output -  Net 2215.9 ml   Filed Weights   08/29/19 1300 08/31/19 0357 09/01/19 0400  Weight: 83.5 kg 75.8 kg 76.8 kg    Exam:  General: NAD, acutely ill-appearing  Cardiovascular: S1, S2 present  Respiratory: CTAB  Abdomen: Soft, nontender, +distended, bowel sounds present  Musculoskeletal: Trace bilateral pedal edema noted  Skin: Normal  Psychiatry: Normal mood  Data Reviewed: CBC: Recent Labs  Lab 08/29/19 0004 08/29/19 0738 08/30/19 0744 08/31/19 0404 09/01/19  0359  WBC 7.4 8.8 10.7* 10.8* 12.3*  NEUTROABS 5.4  --   --   --   --   HGB 11.9* 11.6* 13.1 12.1 12.3  HCT 37.4 36.9 41.2 37.8 37.5  MCV 95.4 97.1 96.0 94.0 93.3  PLT 176 172 162 140* [id number]*   Basic Metabolic Panel: Recent Labs  Lab 08/29/19 0004 08/29/19 0738 08/30/19 0744 08/31/19 0404 09/01/19 0359  NA 140 139 136 135 135  K 3.9 4.0 4.0 3.6 3.2*  CL 106 104 102 103 103  CO2 23 24 21* 23  22  GLUCOSE 130* 159* 130* 107* 117*  BUN 22 17 13 12 11   CREATININE 1.65* 1.44* 1.23* 1.21* 1.34*  CALCIUM 9.5 9.2 9.0 8.8* 8.6*   GFR: Estimated Creatinine Clearance: 31.2 mL/min (A) (by C-G formula based on SCr of 1.34 mg/dL (H)). Liver Function Tests: Recent Labs  Lab 08/29/19 0004  AST 16  ALT 14  ALKPHOS 81  BILITOT 0.6  PROT 7.0  ALBUMIN 3.9   Recent Labs  Lab 08/29/19 0004  LIPASE 30   No results for input(s): AMMONIA in the last 168 hours. Coagulation Profile: No results for input(s): INR, PROTIME in the last 168 hours. Cardiac Enzymes: No results for input(s): CKTOTAL, CKMB, CKMBINDEX, TROPONINI in the last 168 hours. BNP (last 3 results) No results for input(s): PROBNP in the last 8760 hours. HbA1C: No results for input(s): HGBA1C in the last 72 hours. CBG: Recent Labs  Lab 08/31/19 0433 08/31/19 0806 08/31/19 1114 08/31/19 1709 08/31/19 2125  GLUCAP 107* 125* 113* 124* 101*   Lipid Profile: No results for input(s): CHOL, HDL, LDLCALC, TRIG, CHOLHDL, LDLDIRECT in the last 72 hours. Thyroid Function Tests: No results for input(s): TSH, T4TOTAL, FREET4, T3FREE, THYROIDAB in the last 72 hours. Anemia Panel: No results for input(s): VITAMINB12, FOLATE, FERRITIN, TIBC, IRON, RETICCTPCT in the last 72 hours. Urine analysis: No results found for: COLORURINE, APPEARANCEUR, LABSPEC, PHURINE, GLUCOSEU, HGBUR, BILIRUBINUR, KETONESUR, PROTEINUR, UROBILINOGEN, NITRITE, LEUKOCYTESUR Sepsis Labs: @LABRCNTIP (procalcitonin:4,lacticidven:4)  ) Recent Results (from the past 240 hour(s))  SARS CORONAVIRUS 2 (TAT 6-24 HRS) Nasopharyngeal Nasopharyngeal Swab     Status: None   Collection Time: 08/29/19  5:30 AM   Specimen: Nasopharyngeal Swab  Result Value Ref Range Status   SARS Coronavirus 2 NEGATIVE NEGATIVE Final    Comment: (NOTE) SARS-CoV-2 target nucleic acids are NOT DETECTED. The SARS-CoV-2 RNA is generally detectable in upper and lower respiratory  specimens during the acute phase of infection. Negative results do not preclude SARS-CoV-2 infection, do not rule out co-infections with other pathogens, and should not be used as the sole basis for treatment or other patient management decisions. Negative results must be combined with clinical observations, patient history, and epidemiological information. The expected result is Negative. Fact Sheet for Patients: SugarRoll.be Fact Sheet for Healthcare Providers: https://www.woods-mathews.com/ This test is not yet approved or cleared by the [place] FDA and  has been authorized for detection and/or diagnosis of SARS-CoV-2 by FDA under an Emergency Use Authorization (EUA). This EUA will remain  in effect (meaning this test can be used) for the duration of the COVID-19 declaration under Section 56 4(b)(1) of the Act, 21 U.S.C. section 360bbb-3(b)(1), unless the authorization is terminated or revoked sooner. Performed at [facility], [place] [address] [place], [place] [address]       Studies: Dg Abd 1 View  Result Date: 08/31/2019 CLINICAL DATA:  NG tube placement. EXAM: ABDOMEN - 1 VIEW COMPARISON:  08/21/2019 FINDINGS: The nasogastric tube is been retracted slightly since the prior exam. It is coiled within the known intrathoracic stomach. Cardiac leads and tubing over the chest. Cardiomediastinal contours are unchanged. IMPRESSION: The nasogastric tube is been retracted slightly since the prior exam. It is coiled within the known intrathoracic stomach. No other interval changes on this limited fluoroscopic assessment. Electronically Signed   By: Zetta Bills M.D.   On: 08/31/2019 17:30    Scheduled Meds: . chlorhexidine  15 mL Mouth Rinse BID  . latanoprost  1 drop Both Eyes QHS  . levothyroxine  50 mcg Intravenous Daily  . mouth rinse  15 mL Mouth Rinse q12n4p  . pantoprazole (PROTONIX) IV  40 mg Intravenous Q24H     Continuous Infusions: . amiodarone 30 mg/hr (09/01/19 0600)  . ciprofloxacin    . methocarbamol (ROBAXIN) IV       LOS: 3 days     Alma Friendly, MD [facility]  If 7PM-7AM, please contact night-coverage [email or web address] 09/01/2019, 2:32 PM

## 2019-09-01 NOTE — Progress Notes (Signed)
ANTICOAGULATION CONSULT NOTE  Pharmacy Consult:  Heparin Indication: atrial fibrillation  Heparin Dosing Weight: 70.9 kg  Labs: Recent Labs    08/30/19 0744 08/30/19 1638 08/31/19 0404 08/31/19 1245 09/01/19 0359  HGB 13.1  --  12.1  --  12.3  HCT 41.2  --  37.8  --  37.5  PLT 162  --  140*  --  129*  APTT 75* 65* 111* 82* 70*  HEPARINUNFRC 1.03* 0.85* 0.85*  --  0.49  CREATININE 1.23*  --  1.21*  --  1.34*    Assessment: 85 y.o. female with AFib on Eliquis PTA and was transitioned to IV heparin on admit. Heparin was turned off for gastrostomy tube placement and LoA today 09/01/19.  Received consult to restart IV heparin on 09/02/19 without a bolus.   Goal of Therapy:  Heparin level 0.3 - 0.5 units/mL APTT 60 - 85 sec Monitor platelets by anticoagulation protocol: Yes   Plan:  On 09/02/19 at 0800, restart heparin gtt at 1250 units/hr Check heparin level 8 hr post heparin restarted Daily heparin level and CBC  Hillary Schwegler D. Mina Marble, PharmD, BCPS, [place] 09/01/2019, 9:07 PM

## 2019-09-02 DIAGNOSIS — I350 Nonrheumatic aortic (valve) stenosis: Secondary | ICD-10-CM

## 2019-09-02 LAB — BASIC METABOLIC PANEL
Anion gap: 11 (ref 5–15)
BUN: 19 mg/dL (ref 8–23)
CO2: 21 mmol/L — ABNORMAL LOW (ref 22–32)
Calcium: 8.2 mg/dL — ABNORMAL LOW (ref 8.9–10.3)
Chloride: 103 mmol/L (ref 98–111)
Creatinine, Ser: 1.2 mg/dL — ABNORMAL HIGH (ref 0.44–1.00)
GFR calc Af Amer: 48 mL/min — ABNORMAL LOW (ref >60)
GFR calc non Af Amer: 42 mL/min — ABNORMAL LOW (ref >60)
Glucose, Bld: 140 mg/dL — ABNORMAL HIGH (ref 70–99)
Potassium: 4 mmol/L (ref 3.5–5.1)
Sodium: 135 mmol/L (ref 135–145)

## 2019-09-02 LAB — GLUCOSE, CAPILLARY
Glucose-Capillary: 125 mg/dL — ABNORMAL HIGH (ref 70–99)
Glucose-Capillary: 115 mg/dL — ABNORMAL HIGH (ref 70–99)
Glucose-Capillary: 106 mg/dL — ABNORMAL HIGH (ref 70–99)
Glucose-Capillary: 95 mg/dL (ref 70–99)

## 2019-09-02 LAB — CBC WITH DIFFERENTIAL/PLATELET
Abs Immature Granulocytes: 0.06 10*3/uL (ref 0.00–0.07)
Basophils Absolute: 0 10*3/uL (ref 0.0–0.1)
Basophils Relative: 0 %
Eosinophils Absolute: 0 10*3/uL (ref 0.0–0.5)
Eosinophils Relative: 0 %
HCT: 32.1 % — ABNORMAL LOW (ref 36.0–46.0)
Hemoglobin: 10.2 g/dL — ABNORMAL LOW (ref 12.0–15.0)
Immature Granulocytes: 1 %
Lymphocytes Relative: 5 %
Lymphs Abs: 0.4 10*3/uL — ABNORMAL LOW (ref 0.7–4.0)
MCH: 29.8 pg (ref 26.0–34.0)
MCHC: 31.8 g/dL (ref 30.0–36.0)
MCV: 93.9 fL (ref 80.0–100.0)
Monocytes Absolute: 0.9 10*3/uL (ref 0.1–1.0)
Monocytes Relative: 9 %
Neutro Abs: 8.1 10*3/uL — ABNORMAL HIGH (ref 1.7–7.7)
Neutrophils Relative %: 85 %
Platelets: 127 10*3/uL — ABNORMAL LOW (ref 150–400)
RBC: 3.42 MIL/uL — ABNORMAL LOW (ref 3.87–5.11)
RDW: 13.2 % (ref 11.5–15.5)
WBC: 9.4 10*3/uL (ref 4.0–10.5)
nRBC: 0 % (ref 0.0–0.2)

## 2019-09-02 LAB — APTT: aPTT: 43 seconds — ABNORMAL HIGH (ref 24–36)

## 2019-09-02 LAB — HEPARIN LEVEL (UNFRACTIONATED): Heparin Unfractionated: 0.28 [IU]/mL — ABNORMAL LOW (ref 0.30–0.70)

## 2019-09-02 MED ORDER — WHITE PETROLATUM EX OINT: TOPICAL_OINTMENT | CUTANEOUS | Status: DC | PRN

## 2019-09-02 MED ORDER — CHLORHEXIDINE GLUCONATE CLOTH 2 % EX PADS
6.0000 | MEDICATED_PAD | Freq: Every day | CUTANEOUS | Status: DC
  Administered 2019-09-02 – 2019-09-05 (×2): 6 via TOPICAL

## 2019-09-02 MED ORDER — WHITE PETROLATUM EX OINT
TOPICAL_OINTMENT | CUTANEOUS | Status: AC
  Administered 2019-09-02: 16:00:00
  Filled 2019-09-02: qty 28.35

## 2019-09-02 NOTE — Progress Notes (Signed)
PT no bowel movement the past 3 days. Dr. Horris Latino text paged via [facility] requesting for a suppository order.

## 2019-09-02 NOTE — Evaluation (Signed)
Physical Therapy Evaluation Patient Details Name: Alyssa Odonnell MRN: [id number] DOB: [birth year] Today's Date: 09/02/2019   History of Present Illness  Pt is an 85 y.o. female admitted 08/28/19 with worsening epigastric pain. Worked up for large hiatal hernia with gastric outlet obstruction. S/p laparoscopic gastropexy with placement of gastrostomy tube with extensive lysis11/13. PMH includes afib, stroke, HTN, glaucoma.    Clinical Impression  Pt presents with an overall decrease in functional mobility secondary to above. PTA, pt mod indep with RW/rollator, lives with daughter who is not able to provide physical assist. Today, pt requiring min-modA for mobility with RW; limited by generalized weakness, abdominal pain and decreased activity tolerance. Pt would benefit from continued acute PT services to maximize functional mobility and independence prior to d/c with SNF-level therapies; pt in agreement.   SpO2 >92% on RA    Follow Up Recommendations SNF;Supervision for mobility/OOB    Equipment Recommendations  None recommended by PT    Recommendations for Other Services       Precautions / Restrictions Precautions Precautions: Fall Precaution Comments: g tube Restrictions Weight Bearing Restrictions: No      Mobility  Bed Mobility Overal bed mobility: Needs Assistance Bed Mobility: Supine to Sit     Supine to sit: Mod assist;HOB elevated     General bed mobility comments: ModA to assist hips to EOB and UE support for trunk elevation, increased time and effort  Transfers Overall transfer level: Needs assistance Equipment used: Rolling walker (2 wheeled) Transfers: Sit to/from Stand Sit to Stand: Mod assist         General transfer comment: ModA to assist trunk elevation and steady RW, cues for hand placement; limited by abdominal pain  Ambulation/Gait Ambulation/Gait assistance: Min assist Gait Distance (Feet): 2 Feet Assistive device: Rolling walker (2  wheeled) Gait Pattern/deviations: Step-to pattern;Trunk flexed Gait velocity: Decreased   General Gait Details: Slow, guarded steps from bed to recliner with RW and minA; mobility limited by pain and nausea  Stairs            Wheelchair Mobility    Modified Rankin (Stroke Patients Only)       Balance Overall balance assessment: Needs assistance   Sitting balance-Leahy Scale: Fair       Standing balance-Leahy Scale: Poor Standing balance comment: Reliant on UE support                             Pertinent Vitals/Pain Pain Assessment: Faces Faces Pain Scale: Hurts even more Pain Location: Abdomen Pain Descriptors / Indicators: Sore;Grimacing;Guarding Pain Intervention(s): Monitored during session    Home Living Family/patient expects to be discharged to:: Skilled nursing facility Living Arrangements: Children Available Help at Discharge: Family;Available PRN/intermittently Type of Home: House Home Access: Stairs to enter Entrance Stairs-Rails: Right Entrance Stairs-Number of Steps: 4 Home Layout: Two level;Able to live on main level with bedroom/bathroom Home Equipment: Gilford Rile - 2 wheels;Walker - 4 wheels;Bedside commode;Cane - single point Additional Comments: Daughter with health problems and not able to physically assist    Prior Function Level of Independence: Independent with assistive device(s)         Comments: Uses rollator indoors and outdoors, sometimes RW outdoors. Enjoys gardening as able     Hand Dominance        Extremity/Trunk Assessment   Upper Extremity Assessment Upper Extremity Assessment: Generalized weakness    Lower Extremity Assessment Lower Extremity Assessment: Generalized weakness  Communication      Cognition Arousal/Alertness: Awake/alert Behavior During Therapy: WFL for tasks assessed/performed Overall Cognitive Status: No family/caregiver present to determine baseline cognitive functioning                                  General Comments: WFL for simple tasks, demonstrates some short-term memory deficits and poor attention, likely baseline cognition      General Comments General comments (skin integrity, edema, etc.): HR 99-100, SpO2 >92% on RA    Exercises     Assessment/Plan    PT Assessment Patient needs continued PT services  PT Problem List Decreased strength;Decreased activity tolerance;Decreased balance;Decreased mobility;Decreased knowledge of use of DME;Decreased knowledge of precautions;Pain       PT Treatment Interventions DME instruction;Gait training;Stair training;Functional mobility training;Therapeutic activities;Therapeutic exercise;Balance training;Patient/family education    PT Goals (Current goals can be found in the Care Plan section)  Acute Rehab PT Goals Patient Stated Goal: Agreeable to rehab before return home since daughter cannot assist PT Goal Formulation: With patient Time For Goal Achievement: 09/16/19 Potential to Achieve Goals: Good    Frequency Min 2X/week   Barriers to discharge        Co-evaluation               AM-PAC PT "6 Clicks" Mobility  Outcome Measure Help needed turning from your back to your side while in a flat bed without using bedrails?: A Lot Help needed moving from lying on your back to sitting on the side of a flat bed without using bedrails?: A Lot Help needed moving to and from a bed to a chair (including a wheelchair)?: A Lot Help needed standing up from a chair using your arms (e.g., wheelchair or bedside chair)?: A Lot Help needed to walk in hospital room?: A Little Help needed climbing 3-5 steps with a railing? : A Lot 6 Click Score: 13    End of Session   Activity Tolerance: Patient tolerated treatment well;Patient limited by pain Patient left: in chair;with call bell/phone within reach;with chair alarm set Nurse Communication: Mobility status PT Visit Diagnosis: Other  abnormalities of gait and mobility (R26.89);Pain    Time: 1400-1426 PT Time Calculation (min) (ACUTE ONLY): 26 min   Charges:   PT Evaluation $PT Eval Moderate Complexity: 1 Mod PT Treatments $Therapeutic Activity: 8-22 mins   Mabeline Caras, PT, DPT Acute Rehabilitation Services  Pager [phone] Office [place] 09/02/2019, 4:20 PM

## 2019-09-02 NOTE — Progress Notes (Signed)
PROGRESS NOTE  Alyssa Odonnell [id number] DOB: [birth year] DOA: 08/28/2019 PCP: Drake Leach, MD  HPI/Recap of past 24 hours: HPI from [name] 85 year old female, lives with her daughter, ambulates with the help of a walker, PMH of PAF on Eliquis, embolic CVA, HTN, HLD, hypothyroid, PAD, cholecystectomy, presented to [facility] ED on 08/28/2019 due to abrupt onset of severe nausea, dry heaving without vomiting, chest/epigastric abdominal pain.  CT abdomen showed large hiatal hernia with gastric volvulus with gastric outlet obstruction.  Multiple unsuccessful attempts to pass NG tube at bedside.  IR consulted for NG tube under fluoroscopy. General surgery consulted and have tentatively plan for OR on 11/12, if no improvement with medical management.    Today, patient reports feeling okay, reports an appropriate postop pain, denies any chest pain, worsening shortness of breath, fever/chills, nausea/vomiting.   Assessment/Plan: Principal Problem:   Volvulus of stomach Active Problems:   PAD (peripheral artery disease) (HCC)   Essential hypertension   PAF (paroxysmal atrial fibrillation) (HCC)   Gastric outlet obstruction  Large hiatal hernia with gastric outlet obstruction s/p laparoscopic gastropexy with placement of gastrostomy tube with extensive lysis of adhesions Afebrile, with leukocytosis CT abdomen/pelvis on 11/10 showed above Bowel rest, IV fluids, pain management General surgery on board, defer pain and DVT management  Hypokalemia Replace as needed Daily BMP  Thrombocytopenia Likely due to acute illness Daily CBC  Paroxysmal A. fib with RVR Heart rate noted to be in the 90s EKG showed A. fib TTE on 02/2017 showed LVEF of 60 to 65% Patient was initially started on diltiazem drip, then switched to amiodarone drip due to hypotension Continue amiodarone drip Continue IV Heparin Hold home Eliquis pending surgery recommendation Cardiology consulted, appreciate recs   Hypertension BP soft Hold home Benicar, hydrochlorothiazide Monitor closely  Hypothyroidism Continue IV Synthroid  CKD stage IIIb Creatinine at baseline Daily BMP       Malnutrition Type:      Malnutrition Characteristics:      Nutrition Interventions:       Estimated body mass index is 30.81 kg/m as calculated from the following:   Height as of this encounter: 5\' 3"  (1.6 m).   Weight as of this encounter: 78.9 kg.     Code Status: Full  Family Communication: None at bedside  Disposition Plan: To be determined   Consultants:  General surgery  Cardiology  Procedures:  None  Antimicrobials:  None  DVT prophylaxis: IV Heparin   Objective: Vitals:   09/02/19 0301 09/02/19 0817 09/02/19 0821 09/02/19 1208  BP: 117/69   (!) 122/59  Pulse:  85 88 86  Resp: 20     Temp: 98.1 F (36.7 C)  97.8 F (36.6 C) 97.6 F (36.4 C)  TempSrc: Axillary  Oral Oral  SpO2:   95% 96%  Weight: 78.9 kg     Height:        Intake/Output Summary (Last 24 hours) at 09/02/2019 1600 Last data filed at 09/02/2019 1300 Gross per 24 hour  Intake 1550 ml  Output 1715 ml  Net -165 ml   Filed Weights   09/01/19 0400 09/01/19 1509 09/02/19 0301  Weight: 76.8 kg 76.8 kg 78.9 kg    Exam:  General: NAD, acutely ill-appearing  Cardiovascular: S1, S2 present  Respiratory: CTAB  Abdomen: Soft, nontender, non distended, laparoscopic surgical sites C/D/I, hypoactive bowel sounds  Musculoskeletal: No bilateral pedal edema noted  Skin: Normal  Psychiatry: Normal mood   Data Reviewed: CBC: Recent Labs  Lab 08/29/19 0004 08/29/19 0738 08/30/19 0744 08/31/19 0404 09/01/19 0359 09/02/19 0400  WBC 7.4 8.8 10.7* 10.8* 12.3* 9.4  NEUTROABS 5.4  --   --   --   --  8.1*  HGB 11.9* 11.6* 13.1 12.1 12.3 10.2*  HCT 37.4 36.9 41.2 37.8 37.5 32.1*  MCV 95.4 97.1 96.0 94.0 93.3 93.9  PLT 176 172 162 140* 129* [id number]*   Basic Metabolic Panel: Recent Labs  Lab  08/29/19 0738 08/30/19 0744 08/31/19 0404 09/01/19 0359 09/02/19 0400  NA 139 136 135 135 135  K 4.0 4.0 3.6 3.2* 4.0  CL 104 102 103 103 103  CO2 24 21* 23 22 21*  GLUCOSE 159* 130* 107* 117* 140*  BUN 17 13 12 11 19   CREATININE 1.44* 1.23* 1.21* 1.34* 1.20*  CALCIUM 9.2 9.0 8.8* 8.6* 8.2*   GFR: Estimated Creatinine Clearance: 35.3 mL/min (A) (by C-G formula based on SCr of 1.2 mg/dL (H)). Liver Function Tests: Recent Labs  Lab 08/29/19 0004  AST 16  ALT 14  ALKPHOS 81  BILITOT 0.6  PROT 7.0  ALBUMIN 3.9   Recent Labs  Lab 08/29/19 0004  LIPASE 30   No results for input(s): AMMONIA in the last 168 hours. Coagulation Profile: No results for input(s): INR, PROTIME in the last 168 hours. Cardiac Enzymes: No results for input(s): CKTOTAL, CKMB, CKMBINDEX, TROPONINI in the last 168 hours. BNP (last 3 results) No results for input(s): PROBNP in the last 8760 hours. HbA1C: No results for input(s): HGBA1C in the last 72 hours. CBG: Recent Labs  Lab 08/31/19 1709 08/31/19 2125 09/01/19 2149 09/02/19 0748 09/02/19 1144  GLUCAP 124* 101* 127* 125* 115*   Lipid Profile: No results for input(s): CHOL, HDL, LDLCALC, TRIG, CHOLHDL, LDLDIRECT in the last 72 hours. Thyroid Function Tests: No results for input(s): TSH, T4TOTAL, FREET4, T3FREE, THYROIDAB in the last 72 hours. Anemia Panel: No results for input(s): VITAMINB12, FOLATE, FERRITIN, TIBC, IRON, RETICCTPCT in the last 72 hours. Urine analysis: No results found for: COLORURINE, APPEARANCEUR, LABSPEC, PHURINE, GLUCOSEU, HGBUR, BILIRUBINUR, KETONESUR, PROTEINUR, UROBILINOGEN, NITRITE, LEUKOCYTESUR Sepsis Labs: @LABRCNTIP (procalcitonin:4,lacticidven:4)  ) Recent Results (from the past 240 hour(s))  SARS CORONAVIRUS 2 (TAT 6-24 HRS) Nasopharyngeal Nasopharyngeal Swab     Status: None   Collection Time: 08/29/19  5:30 AM   Specimen: Nasopharyngeal Swab  Result Value Ref Range Status   SARS Coronavirus 2  NEGATIVE NEGATIVE Final    Comment: (NOTE) SARS-CoV-2 target nucleic acids are NOT DETECTED. The SARS-CoV-2 RNA is generally detectable in upper and lower respiratory specimens during the acute phase of infection. Negative results do not preclude SARS-CoV-2 infection, do not rule out co-infections with other pathogens, and should not be used as the sole basis for treatment or other patient management decisions. Negative results must be combined with clinical observations, patient history, and epidemiological information. The expected result is Negative. Fact Sheet for Patients: SugarRoll.be Fact Sheet for Healthcare Providers: https://www.woods-mathews.com/ This test is not yet approved or cleared by the [place] FDA and  has been authorized for detection and/or diagnosis of SARS-CoV-2 by FDA under an Emergency Use Authorization (EUA). This EUA will remain  in effect (meaning this test can be used) for the duration of the COVID-19 declaration under Section 56 4(b)(1) of the Act, 21 U.S.C. section 360bbb-3(b)(1), unless the authorization is terminated or revoked sooner. Performed at [facility], [place] [address] [place], [place] [address]   Surgical pcr screen     Status:  None   Collection Time: 09/01/19  2:16 PM   Specimen: Nasal Mucosa; Nasal Swab  Result Value Ref Range Status   MRSA, PCR NEGATIVE NEGATIVE Final   Staphylococcus aureus NEGATIVE NEGATIVE Final    Comment: (NOTE) The Xpert SA Assay (FDA approved for NASAL specimens in patients 15 years of age and older), is one component of a comprehensive surveillance program. It is not intended to diagnose infection nor to guide or monitor treatment. Performed at [facility], [place] [address], [place], [place] [address]       Studies: No results found.  Scheduled Meds: . chlorhexidine  15 mL Mouth Rinse BID  . Chlorhexidine Gluconate Cloth  6 each Topical Daily   . latanoprost  1 drop Both Eyes QHS  . levothyroxine  50 mcg Intravenous Daily  . mouth rinse  15 mL Mouth Rinse q12n4p  . pantoprazole (PROTONIX) IV  40 mg Intravenous Q24H    Continuous Infusions: . amiodarone 30 mg/hr (09/02/19 0121)  . dextrose 5 % and 0.9% NaCl 75 mL/hr at 09/01/19 2158  . heparin 1,250 Units/hr (09/02/19 0907)  . methocarbamol (ROBAXIN) IV       LOS: 4 days     Alma Friendly, MD [facility]  If 7PM-7AM, please contact night-coverage [email or web address] 09/02/2019, 4:00 PM

## 2019-09-02 NOTE — Anesthesia Postprocedure Evaluation (Signed)
Anesthesia Post Note  Patient: Alyssa Odonnell  Procedure(s) Performed: INSERTION OF GASTROSTOMY TUBE (N/A ) LAPAROSCOPIC, POSSIBLE OPEN, GASTROPEXY (N/A ) Laparoscopic Lysis Of Adhesions (Abdomen)     Patient location during evaluation: PACU Anesthesia Type: General Level of consciousness: sedated and patient cooperative Pain management: pain level controlled Vital Signs Assessment: post-procedure vital signs reviewed and stable Respiratory status: spontaneous breathing Cardiovascular status: stable Anesthetic complications: no    Last Vitals:  Vitals:   09/02/19 0817 09/02/19 0821  BP:    Pulse: 85 88  Resp:    Temp:  36.6 C  SpO2:  95%    Last Pain:  Vitals:   09/02/19 0821  TempSrc: Oral  PainSc:                  [name]

## 2019-09-02 NOTE — Progress Notes (Signed)
1 Day Post-Op   Subjective/Chief Complaint: Comfortable this morning   Objective: Vital signs in last 24 hours: Temp:  [97.5 F (36.4 C)-98.3 F (36.8 C)] 97.8 F (36.6 C) (11/14 0821) Pulse Rate:  [85-136] 88 (11/14 0821) Resp:  [20-27] 20 (11/14 0301) BP: (105-136)/(51-84) 117/69 (11/14 0301) SpO2:  [91 %-100 %] 95 % (11/14 0821) Weight:  [76.8 kg-78.9 kg] 78.9 kg (11/14 0301) Last BM Date: 08/29/19  Intake/Output from previous day: 11/13 0701 - 11/14 0700 In: 1550 [I.V.:1300; IV Piggyback:250] Out: 965 [Urine:350; Drains:600; Blood:15] Intake/Output this shift: No intake/output data recorded.  Exam: Awake and alert Abdomen soft, G-tube in place, bile and old blood out  Lab Results:  Recent Labs    09/01/19 0359 09/02/19 0400  WBC 12.3* 9.4  HGB 12.3 10.2*  HCT 37.5 32.1*  PLT 129* 127*   BMET Recent Labs    09/01/19 0359 09/02/19 0400  NA 135 135  K 3.2* 4.0  CL 103 103  CO2 22 21*  GLUCOSE 117* 140*  BUN 11 19  CREATININE 1.34* 1.20*  CALCIUM 8.6* 8.2*   PT/INR No results for input(s): LABPROT, INR in the last 72 hours. ABG No results for input(s): PHART, HCO3 in the last 72 hours.  Invalid input(s): PCO2, PO2  Studies/Results: Dg Abd 1 View  Result Date: 08/31/2019 CLINICAL DATA:  NG tube placement. EXAM: ABDOMEN - 1 VIEW COMPARISON:  08/21/2019 FINDINGS: The nasogastric tube is been retracted slightly since the prior exam. It is coiled within the known intrathoracic stomach. Cardiac leads and tubing over the chest. Cardiomediastinal contours are unchanged. IMPRESSION: The nasogastric tube is been retracted slightly since the prior exam. It is coiled within the known intrathoracic stomach. No other interval changes on this limited fluoroscopic assessment. Electronically Signed   By: Zetta Bills M.D.   On: 08/31/2019 17:30    Anti-infectives: Anti-infectives (From admission, onward)   Start     Dose/Rate Route Frequency Ordered Stop   09/01/19 1400  ciprofloxacin (CIPRO) IVPB 400 mg     400 mg 200 mL/hr over 60 Minutes Intravenous On call to O.R. 09/01/19 0738 09/01/19 1615      Assessment/Plan: s/p Procedure(s): INSERTION OF GASTROSTOMY TUBE (N/A) LAPAROSCOPIC, POSSIBLE OPEN, GASTROPEXY (N/A) Laparoscopic Lysis Of Adhesions  Continue bowel rest today Repeat cbc in the morning   LOS: 4 days    Coralie Keens 09/02/2019

## 2019-09-02 NOTE — Progress Notes (Signed)
ANTICOAGULATION CONSULT NOTE  Pharmacy Consult:  Heparin Indication: atrial fibrillation  Heparin Dosing Weight: 70.9 kg  Labs: Recent Labs    08/31/19 0404 08/31/19 1245 09/01/19 0359 09/02/19 0400 09/02/19 1621  HGB 12.1  --  12.3 10.2*  --   HCT 37.8  --  37.5 32.1*  --   PLT 140*  --  129* 127*  --   APTT 111* 82* 70*  --  43*  HEPARINUNFRC 0.85*  --  0.49  --  0.28*  CREATININE 1.21*  --  1.34* 1.20*  --     Assessment: 85 y.o. female with AFib on Eliquis PTA and was transitioned to IV heparin on admit. Heparin was turned off for gastrostomy tube placement and LoA 11/13, resumed 11/14 am. Initial heparin level is slightly below goal at 0.28 while aPTT is subtherapeutic at 43 seconds indicating DOAC use is still altering heparin levels.  Goal of Therapy:  Heparin level 0.3 - 0.5 units/mL APTT 66 - 85 sec Monitor platelets by anticoagulation protocol: Yes   Plan:  -Increase heparin to 1350 units/hr -Recheck heparin level and aPTT with morning labs   Arrie Senate, PharmD, BCPS Clinical Pharmacist [phone] Please check AMION for all [place] numbers 09/02/2019

## 2019-09-02 NOTE — Progress Notes (Signed)
Progress Note  Patient Name: Alyssa Odonnell Date of Encounter: 09/02/2019  Primary Cardiologist: Cristopher Peru, MD   Subjective   Resting comfortably this morning.  Postoperative day #1.  Inpatient Medications    Scheduled Meds: . chlorhexidine  15 mL Mouth Rinse BID  . latanoprost  1 drop Both Eyes QHS  . levothyroxine  50 mcg Intravenous Daily  . mouth rinse  15 mL Mouth Rinse q12n4p  . pantoprazole (PROTONIX) IV  40 mg Intravenous Q24H  . white petrolatum       Continuous Infusions: . amiodarone 30 mg/hr (09/02/19 0121)  . dextrose 5 % and 0.9% NaCl 75 mL/hr at 09/01/19 2158  . heparin 1,250 Units/hr (09/02/19 0907)  . methocarbamol (ROBAXIN) IV     PRN Meds: [DISCONTINUED] acetaminophen **OR** acetaminophen, hydrALAZINE, methocarbamol (ROBAXIN) IV, morphine injection, [DISCONTINUED] ondansetron **OR** ondansetron (ZOFRAN) IV, promethazine, white petrolatum   Vital Signs    Vitals:   09/01/19 2200 09/02/19 0301 09/02/19 0817 09/02/19 0821  BP: 120/81 117/69    Pulse: (!) 136  85 88  Resp: (!) 27 20    Temp:  98.1 F (36.7 C)  97.8 F (36.6 C)  TempSrc:  Axillary  Oral  SpO2: 91%   95%  Weight:  78.9 kg    Height:        Intake/Output Summary (Last 24 hours) at 09/02/2019 1031 Last data filed at 09/02/2019 0300 Gross per 24 hour  Intake 1550 ml  Output 965 ml  Net 585 ml   Filed Weights   09/01/19 0400 09/01/19 1509 09/02/19 0301  Weight: 76.8 kg 76.8 kg 78.9 kg    Telemetry    Sinus rhythm, PACs, occasional PVCs, intermittent atrial fibrillation- Personally Reviewed   Physical Exam   GEN: No acute distress.   Neck: No JVD Cardiac: RRR with premature contractions, soft systolic murmur over right upper sternal border, no rubs, or gallops.  Respiratory: Clear to auscultation bilaterally. GI: Soft  MS: No edema; No deformity. Neuro:  Nonfocal  Psych: Normal affect   Labs    Chemistry Recent Labs  Lab 08/29/19 0004  08/31/19 0404  09/01/19 0359 09/02/19 0400  NA 140   < > 135 135 135  K 3.9   < > 3.6 3.2* 4.0  CL 106   < > 103 103 103  CO2 23   < > 23 22 21*  GLUCOSE 130*   < > 107* 117* 140*  BUN 22   < > 12 11 19   CREATININE 1.65*   < > 1.21* 1.34* 1.20*  CALCIUM 9.5   < > 8.8* 8.6* 8.2*  PROT 7.0  --   --   --   --   ALBUMIN 3.9  --   --   --   --   AST 16  --   --   --   --   ALT 14  --   --   --   --   ALKPHOS 81  --   --   --   --   BILITOT 0.6  --   --   --   --   GFRNONAA 28*   < > 41* 37* 42*  GFRAA 33*   < > 48* 42* 48*  ANIONGAP 11   < > 9 10 11    < > = values in this interval not displayed.     Hematology Recent Labs  Lab 08/31/19 0404 09/01/19 0359 09/02/19 0400  WBC  10.8* 12.3* 9.4  RBC 4.02 4.02 3.42*  HGB 12.1 12.3 10.2*  HCT 37.8 37.5 32.1*  MCV 94.0 93.3 93.9  MCH 30.1 30.6 29.8  MCHC 32.0 32.8 31.8  RDW 13.0 13.0 13.2  PLT 140* 129* 127*    Cardiac EnzymesNo results for input(s): TROPONINI in the last 168 hours. No results for input(s): TROPIPOC in the last 168 hours.   BNPNo results for input(s): BNP, PROBNP in the last 168 hours.   DDimer No results for input(s): DDIMER in the last 168 hours.   Radiology    Dg Abd 1 View  Result Date: 08/31/2019 CLINICAL DATA:  NG tube placement. EXAM: ABDOMEN - 1 VIEW COMPARISON:  08/21/2019 FINDINGS: The nasogastric tube is been retracted slightly since the prior exam. It is coiled within the known intrathoracic stomach. Cardiac leads and tubing over the chest. Cardiomediastinal contours are unchanged. IMPRESSION: The nasogastric tube is been retracted slightly since the prior exam. It is coiled within the known intrathoracic stomach. No other interval changes on this limited fluoroscopic assessment. Electronically Signed   By: Zetta Bills M.D.   On: 08/31/2019 17:30     Patient Profile     85 y.o. female with a hx of HTN, PAf on Eliquis, CVA, HLD, CKD III, mild AS, hypothyroidism who was seen by cardiology for surgical  clearance for surgery for hiatal hernia.  Assessment & Plan    1.  Paroxysmal atrial fibrillation with RVR: Currently on IV amiodarone and IV heparin.  Postoperative day #1.  Resume Eliquis when okay with surgery.  2.  Hypertension: Blood pressure is normal today.  3.  Aortic stenosis: Mild in severity by echocardiogram in 2018.  Will monitor in outpatient setting.     For questions or updates, please contact [place] Please consult [email or web address] for contact info under Cardiology/STEMI.      Signed, Kate Sable, MD  09/02/2019, 10:31 AM

## 2019-09-03 LAB — BASIC METABOLIC PANEL
Anion gap: 10 (ref 5–15)
BUN: 21 mg/dL (ref 8–23)
CO2: 22 mmol/L (ref 22–32)
Calcium: 8.4 mg/dL — ABNORMAL LOW (ref 8.9–10.3)
Chloride: 107 mmol/L (ref 98–111)
Creatinine, Ser: 1.27 mg/dL — ABNORMAL HIGH (ref 0.44–1.00)
GFR calc Af Amer: 45 mL/min — ABNORMAL LOW (ref >60)
GFR calc non Af Amer: 39 mL/min — ABNORMAL LOW (ref >60)
Glucose, Bld: 98 mg/dL (ref 70–99)
Potassium: 3.3 mmol/L — ABNORMAL LOW (ref 3.5–5.1)
Sodium: 139 mmol/L (ref 135–145)

## 2019-09-03 LAB — CBC WITH DIFFERENTIAL/PLATELET
Abs Immature Granulocytes: 0.03 10*3/uL (ref 0.00–0.07)
Basophils Absolute: 0 10*3/uL (ref 0.0–0.1)
Basophils Relative: 0 %
Eosinophils Absolute: 0 10*3/uL (ref 0.0–0.5)
Eosinophils Relative: 0 %
HCT: 32.6 % — ABNORMAL LOW (ref 36.0–46.0)
Hemoglobin: 10.5 g/dL — ABNORMAL LOW (ref 12.0–15.0)
Immature Granulocytes: 0 %
Lymphocytes Relative: 17 %
Lymphs Abs: 1.5 10*3/uL (ref 0.7–4.0)
MCH: 30.3 pg (ref 26.0–34.0)
MCHC: 32.2 g/dL (ref 30.0–36.0)
MCV: 94.2 fL (ref 80.0–100.0)
Monocytes Absolute: 1.2 10*3/uL — ABNORMAL HIGH (ref 0.1–1.0)
Monocytes Relative: 13 %
Neutro Abs: 6.5 10*3/uL (ref 1.7–7.7)
Neutrophils Relative %: 70 %
Platelets: 176 10*3/uL (ref 150–400)
RBC: 3.46 MIL/uL — ABNORMAL LOW (ref 3.87–5.11)
RDW: 13.3 % (ref 11.5–15.5)
WBC: 9.4 10*3/uL (ref 4.0–10.5)
nRBC: 0 % (ref 0.0–0.2)

## 2019-09-03 LAB — GLUCOSE, CAPILLARY
Glucose-Capillary: 84 mg/dL (ref 70–99)
Glucose-Capillary: 80 mg/dL (ref 70–99)
Glucose-Capillary: 90 mg/dL (ref 70–99)
Glucose-Capillary: 83 mg/dL (ref 70–99)

## 2019-09-03 LAB — HEPARIN LEVEL (UNFRACTIONATED)
Heparin Unfractionated: 0.24 IU/mL — ABNORMAL LOW (ref 0.30–0.70)
Heparin Unfractionated: 0.25 IU/mL — ABNORMAL LOW (ref 0.30–0.70)
Heparin Unfractionated: 0.17 IU/mL — ABNORMAL LOW (ref 0.30–0.70)

## 2019-09-03 LAB — APTT
aPTT: 48 seconds — ABNORMAL HIGH (ref 24–36)
aPTT: 52 seconds — ABNORMAL HIGH (ref 24–36)

## 2019-09-03 MED ORDER — DEXTROSE-NACL 5-0.45 % IV SOLN
INTRAVENOUS | Status: DC
  Administered 2019-09-03 – 2019-09-04 (×2): via INTRAVENOUS

## 2019-09-03 MED ORDER — BISACODYL 10 MG RE SUPP
10.0000 mg | Freq: Once | RECTAL | Status: AC
  Administered 2019-09-03: 10 mg via RECTAL
  Filled 2019-09-03: qty 1

## 2019-09-03 MED ORDER — POTASSIUM CHLORIDE 10 MEQ/100ML IV SOLN
10.0000 meq | INTRAVENOUS | Status: AC
  Administered 2019-09-03 (×4): 10 meq via INTRAVENOUS
  Filled 2019-09-03 (×4): qty 100

## 2019-09-03 NOTE — Progress Notes (Signed)
ANTICOAGULATION CONSULT NOTE - Follow Up Consult  Pharmacy Consult for heparin Indication: atrial fibrillation  Allergies  Allergen Reactions  . Aspirin Other (See Comments)    Red splotches  . Atorvastatin Other (See Comments)    Muscle spams  . Nabumetone Itching  . Nsaids     Reaction (??)  . Pravastatin Other (See Comments)    Stiff walking difficulty Stiff walking difficulty  . Procaine Swelling  . Rosuvastatin Nausea Only and Other (See Comments)    Reflux and Muscle stiffness  . Simvastatin Nausea Only and Other (See Comments)    Reflux and Muscle stiffness  . Statins Other (See Comments)  . Evolocumab Rash  . Mobic [Meloxicam] Rash  . Penicillins Rash    Has patient had a PCN reaction causing immediate rash, facial/tongue/throat swelling, SOB or lightheadedness with hypotension: Yes Has patient had a PCN reaction causing severe rash involving mucus membranes or skin necrosis: No Has patient had a PCN reaction that required hospitalization: No Has patient had a PCN reaction occurring within the last 10 years: No If all of the above answers are "NO", then may proceed with Cephalosporin use.      Patient Measurements: Height: 5\' 3"  (160 cm) Weight: 172 lb 13.5 oz (78.4 kg) IBW/kg (Calculated) : 52.4 Heparin Dosing Weight: 69 kg   Vital Signs: Temp: 99.3 F (37.4 C) (11/15 1956) Temp Source: Oral (11/15 1956) BP: 122/69 (11/15 1948) Pulse Rate: 105 (11/15 1948)  Labs: Recent Labs    09/01/19 0359 09/02/19 0400 09/02/19 1621 09/03/19 0308 09/03/19 1146 09/03/19 2111  HGB 12.3 10.2*  --  10.5*  --   --   HCT 37.5 32.1*  --  32.6*  --   --   PLT 129* 127*  --  176  --   --   APTT 70*  --  43* 48* 52*  --   HEPARINUNFRC 0.49  --  0.28* 0.24* 0.25* 0.17*  CREATININE 1.34* 1.20*  --  1.27*  --   --     Estimated Creatinine Clearance: 33.3 mL/min (A) (by C-G formula based on SCr of 1.27 mg/dL (H)).   Medications:  Infusions:  . amiodarone 30 mg/hr  (09/03/19 1316)  . dextrose 5 % and 0.45% NaCl 75 mL/hr at 09/03/19 1944  . heparin 1,600 Units/hr (09/03/19 1948)  . methocarbamol (ROBAXIN) IV      Assessment: 85 y.o. female with AFib on Eliquis PTA and was transitioned to IV heparin on admit. Heparin was turned off for gastrostomy tube placement and LoA today 09/01/19.  Received consult to restart IV heparin on 09/02/19 without a bolus.   Heparin remains subtherapeutic despite rate adjustments   Goal of Therapy:  Heparin level 0.3-0.5 units/ml aPTT 66-85 seconds Monitor platelets by anticoagulation protocol: Yes   Plan:  -Increase heparin to 1800 units/hr -Recheck heparin level in [place], PharmD, BCPS Clinical Pharmacist [phone] Please check AMION for all [place] numbers 09/03/2019

## 2019-09-03 NOTE — Progress Notes (Signed)
ANTICOAGULATION CONSULT NOTE - Follow Up Consult  Pharmacy Consult for heparin Indication: atrial fibrillation  Allergies  Allergen Reactions  . Aspirin Other (See Comments)    Red splotches  . Atorvastatin Other (See Comments)    Muscle spams  . Nabumetone Itching  . Nsaids     Reaction (??)  . Pravastatin Other (See Comments)    Stiff walking difficulty Stiff walking difficulty  . Procaine Swelling  . Rosuvastatin Nausea Only and Other (See Comments)    Reflux and Muscle stiffness  . Simvastatin Nausea Only and Other (See Comments)    Reflux and Muscle stiffness  . Statins Other (See Comments)  . Evolocumab Rash  . Mobic [Meloxicam] Rash  . Penicillins Rash    Has patient had a PCN reaction causing immediate rash, facial/tongue/throat swelling, SOB or lightheadedness with hypotension: Yes Has patient had a PCN reaction causing severe rash involving mucus membranes or skin necrosis: No Has patient had a PCN reaction that required hospitalization: No Has patient had a PCN reaction occurring within the last 10 years: No If all of the above answers are "NO", then may proceed with Cephalosporin use.      Patient Measurements: Height: 5\' 3"  (160 cm) Weight: 172 lb 13.5 oz (78.4 kg) IBW/kg (Calculated) : 52.4 Heparin Dosing Weight: 69 kg   Vital Signs: Temp: 97.6 F (36.4 C) (11/15 0837) Temp Source: Oral (11/15 0837) BP: 129/71 (11/15 0837) Pulse Rate: 84 (11/15 0644)  Labs: Recent Labs    09/01/19 0359 09/02/19 0400 09/02/19 1621 09/03/19 0308  HGB 12.3 10.2*  --  10.5*  HCT 37.5 32.1*  --  32.6*  PLT 129* 127*  --  176  APTT 70*  --  43* 48*  HEPARINUNFRC 0.49  --  0.28* 0.24*  CREATININE 1.34* 1.20*  --  1.27*    Estimated Creatinine Clearance: 33.3 mL/min (A) (by C-G formula based on SCr of 1.27 mg/dL (H)).   Medications:  Infusions:  . amiodarone 30 mg/hr (09/03/19 0300)  . heparin 1,450 Units/hr (09/03/19 0517)  . methocarbamol (ROBAXIN) IV     . potassium chloride 10 mEq (09/03/19 1011)    Assessment: 85 y.o. female with AFib on Eliquis PTA and was transitioned to IV heparin on admit. Heparin was turned off for gastrostomy tube placement and LoA today 09/01/19.  Received consult to restart IV heparin on 09/02/19 without a bolus.   Heparin level and aPTT subtherapeutic upon restart, so infusion rate was increased to 1450 units/hour. Her H&H this am is low but stable at 10.5/32.6. plts wnl now at 176. Heparin level this afternoon is still subtherapeutic at 0.25 and aPTT is still subtherapeutic at 52 s/p rate increase. No infusion issues noted per nursing. Levels appear to be correlating now, so will dose off of HL for next dose change.   Goal of Therapy:  Heparin level 0.3-0.5 units/ml aPTT 66-85 seconds Monitor platelets by anticoagulation protocol: Yes   Plan:  Increase heparin infusion to 1600 units/hr Check anti-Xa level in 8 hours and daily while on heparin Continue to monitor H&H and platelets   Thank you,   Eddie Candle, PharmD PGY-1 Pharmacy Resident   Please check amion for clinical pharmacist contact number 09/03/2019,10:54 AM

## 2019-09-03 NOTE — Progress Notes (Signed)
Potassium 3.3- paged [facility] on call

## 2019-09-03 NOTE — Progress Notes (Signed)
Cardiologist on call notified of pt being in nsr & st. HR ranging from 95-104. Pt still on amio gtt & is npo d/t g tube placement. Will await any new orders & continue to monitor the pt. Hoover Brunette, RN

## 2019-09-03 NOTE — Progress Notes (Signed)
PROGRESS NOTE  Alyssa Odonnell [id number] DOB: 1934-10-12 DOA: 08/28/2019 PCP: Drake Leach, MD  HPI/Recap of past 24 hours: HPI from [name] 85 year old female, lives with her daughter, ambulates with the help of a walker, PMH of PAF on Eliquis, embolic CVA, HTN, HLD, hypothyroid, PAD, cholecystectomy, presented to [facility] ED on 08/28/2019 due to abrupt onset of severe nausea, dry heaving without vomiting, chest/epigastric abdominal pain.  CT abdomen showed large hiatal hernia with gastric volvulus with gastric outlet obstruction.  Multiple unsuccessful attempts to pass NG tube at bedside.  IR consulted for NG tube under fluoroscopy. General surgery consulted and have tentatively plan for OR on 11/12, if no improvement with medical management.    Today, patient not happy about overall care she has received so far.  Frustrated about her postop care, persistent A. fib, generalized body aches from laying in bed, complained about noise on the hallway overnight preventing her from sleeping.  Discussed extensively with patient, reassured patient.  Spoke to RN about patient's complaints, advised to be more attentive towards patient's needs. Patient denied any chest pain, worsening abdominal pain, nausea/vomiting, fever/chills.  Complained of constipation.   Assessment/Plan: Principal Problem:   Volvulus of stomach Active Problems:   PAD (peripheral artery disease) (HCC)   Essential hypertension   PAF (paroxysmal atrial fibrillation) (HCC)   Gastric outlet obstruction  Large hiatal hernia with gastric outlet obstruction s/p laparoscopic gastropexy with placement of gastrostomy tube with extensive lysis of adhesions Afebrile, with resolved leukocytosis CT abdomen/pelvis on 11/10 showed above Bowel rest, IV fluids, pain management General surgery on board, defer pain and DVT management  Hypokalemia Replace as needed Daily BMP  Thrombocytopenia Resolved Likely due to acute illness  Daily CBC  Paroxysmal A. fib with RVR HR uncontrolled EKG showed A. fib TTE on 02/2017 showed LVEF of 60 to 65% Patient was initially started on diltiazem drip, then switched to amiodarone drip due to hypotension Continue amiodarone drip Continue IV Heparin Hold home Eliquis pending surgery recommendation Cardiology consulted, appreciate recs  Hypertension BP soft Hold home Benicar, hydrochlorothiazide Monitor closely  Hypothyroidism Continue IV Synthroid  CKD stage IIIb Creatinine at baseline Daily BMP       Malnutrition Type:      Malnutrition Characteristics:      Nutrition Interventions:       Estimated body mass index is 30.62 kg/m as calculated from the following:   Height as of this encounter: 5\' 3"  (1.6 m).   Weight as of this encounter: 78.4 kg.     Code Status: Full  Family Communication: None at bedside  Disposition Plan: To be determined   Consultants:  General surgery  Cardiology  Procedures:  None  Antimicrobials:  None  DVT prophylaxis: IV Heparin   Objective: Vitals:   09/03/19 0837 09/03/19 0845 09/03/19 1233 09/03/19 1548  BP: 129/71   117/81  Pulse:    (!) 141  Resp:      Temp: 97.6 F (36.4 C)  98.5 F (36.9 C) 97.9 F (36.6 C)  TempSrc: Oral  Oral Oral  SpO2:  97%  95%  Weight:      Height:        Intake/Output Summary (Last 24 hours) at 09/03/2019 1813 Last data filed at 09/03/2019 1549 Gross per 24 hour  Intake 3402.42 ml  Output 1650 ml  Net 1752.42 ml   Filed Weights   09/01/19 1509 09/02/19 0301 09/03/19 0624  Weight: 76.8 kg 78.9 kg 78.4 kg  Exam:  General: NAD, acutely ill-appearing  Cardiovascular: S1, S2 present  Respiratory:  CTAB  Abdomen: Soft, nontender, nondistended, laparoscopic surgical sites C/D/I, G-tube noted in place, hypoactive bowel sounds  Musculoskeletal: No bilateral pedal edema noted  Skin: Normal  Psychiatry: Normal mood   Data Reviewed: CBC:  Recent Labs  Lab 08/29/19 0004  08/30/19 0744 08/31/19 0404 09/01/19 0359 09/02/19 0400 09/03/19 0308  WBC 7.4   < > 10.7* 10.8* 12.3* 9.4 9.4  NEUTROABS 5.4  --   --   --   --  8.1* 6.5  HGB 11.9*   < > 13.1 12.1 12.3 10.2* 10.5*  HCT 37.4   < > 41.2 37.8 37.5 32.1* 32.6*  MCV 95.4   < > 96.0 94.0 93.3 93.9 94.2  PLT 176   < > 162 140* 129* 127* 176   < > = values in this interval not displayed.   Basic Metabolic Panel: Recent Labs  Lab 08/30/19 0744 08/31/19 0404 09/01/19 0359 09/02/19 0400 09/03/19 0308  NA 136 135 135 135 139  K 4.0 3.6 3.2* 4.0 3.3*  CL 102 103 103 103 107  CO2 21* 23 22 21* 22  GLUCOSE 130* 107* 117* 140* 98  BUN 13 12 11 19 21   CREATININE 1.23* 1.21* 1.34* 1.20* 1.27*  CALCIUM 9.0 8.8* 8.6* 8.2* 8.4*   GFR: Estimated Creatinine Clearance: 33.3 mL/min (A) (by C-G formula based on SCr of 1.27 mg/dL (H)). Liver Function Tests: Recent Labs  Lab 08/29/19 0004  AST 16  ALT 14  ALKPHOS 81  BILITOT 0.6  PROT 7.0  ALBUMIN 3.9   Recent Labs  Lab 08/29/19 0004  LIPASE 30   No results for input(s): AMMONIA in the last 168 hours. Coagulation Profile: No results for input(s): INR, PROTIME in the last 168 hours. Cardiac Enzymes: No results for input(s): CKTOTAL, CKMB, CKMBINDEX, TROPONINI in the last 168 hours. BNP (last 3 results) No results for input(s): PROBNP in the last 8760 hours. HbA1C: No results for input(s): HGBA1C in the last 72 hours. CBG: Recent Labs  Lab 09/02/19 1559 09/02/19 2106 09/03/19 0830 09/03/19 1155 09/03/19 1551  GLUCAP 106* 95 84 80 90   Lipid Profile: No results for input(s): CHOL, HDL, LDLCALC, TRIG, CHOLHDL, LDLDIRECT in the last 72 hours. Thyroid Function Tests: No results for input(s): TSH, T4TOTAL, FREET4, T3FREE, THYROIDAB in the last 72 hours. Anemia Panel: No results for input(s): VITAMINB12, FOLATE, FERRITIN, TIBC, IRON, RETICCTPCT in the last 72 hours. Urine analysis: No results found for:  COLORURINE, APPEARANCEUR, LABSPEC, PHURINE, GLUCOSEU, HGBUR, BILIRUBINUR, KETONESUR, PROTEINUR, UROBILINOGEN, NITRITE, LEUKOCYTESUR Sepsis Labs: @LABRCNTIP (procalcitonin:4,lacticidven:4)  ) Recent Results (from the past 240 hour(s))  SARS CORONAVIRUS 2 (TAT 6-24 HRS) Nasopharyngeal Nasopharyngeal Swab     Status: None   Collection Time: 08/29/19  5:30 AM   Specimen: Nasopharyngeal Swab  Result Value Ref Range Status   SARS Coronavirus 2 NEGATIVE NEGATIVE Final    Comment: (NOTE) SARS-CoV-2 target nucleic acids are NOT DETECTED. The SARS-CoV-2 RNA is generally detectable in upper and lower respiratory specimens during the acute phase of infection. Negative results do not preclude SARS-CoV-2 infection, do not rule out co-infections with other pathogens, and should not be used as the sole basis for treatment or other patient management decisions. Negative results must be combined with clinical observations, patient history, and epidemiological information. The expected result is Negative. Fact Sheet for Patients: SugarRoll.be Fact Sheet for Healthcare Providers: https://www.woods-mathews.com/ This test is not yet approved or cleared  by the [place] and  has been authorized for detection and/or diagnosis of SARS-CoV-2 by FDA under an Emergency Use Authorization (EUA). This EUA will remain  in effect (meaning this test can be used) for the duration of the COVID-19 declaration under Section 56 4(b)(1) of the Act, 21 U.S.C. section 360bbb-3(b)(1), unless the authorization is terminated or revoked sooner. Performed at [facility], [place] [address], [place], [place] [address]   Surgical pcr screen     Status: None   Collection Time: 09/01/19  2:16 PM   Specimen: Nasal Mucosa; Nasal Swab  Result Value Ref Range Status   MRSA, PCR NEGATIVE NEGATIVE Final   Staphylococcus aureus NEGATIVE NEGATIVE Final    Comment: (NOTE) The Xpert  SA Assay (FDA approved for NASAL specimens in patients 61 years of age and older), is one component of a comprehensive surveillance program. It is not intended to diagnose infection nor to guide or monitor treatment. Performed at [facility], [place] [address] [place], [place] [address]       Studies: No results found.  Scheduled Meds: . chlorhexidine  15 mL Mouth Rinse BID  . Chlorhexidine Gluconate Cloth  6 each Topical Daily  . latanoprost  1 drop Both Eyes QHS  . levothyroxine  50 mcg Intravenous Daily  . mouth rinse  15 mL Mouth Rinse q12n4p  . pantoprazole (PROTONIX) IV  40 mg Intravenous Q24H    Continuous Infusions: . amiodarone 30 mg/hr (09/03/19 1316)  . heparin 1,600 Units/hr (09/03/19 1245)  . methocarbamol (ROBAXIN) IV       LOS: 5 days     Alma Friendly, MD [facility]  If 7PM-7AM, please contact night-coverage [email or web address] 09/03/2019, 6:13 PM

## 2019-09-03 NOTE — Progress Notes (Signed)
ANTICOAGULATION CONSULT NOTE  Pharmacy Consult:  Heparin Indication: atrial fibrillation  Heparin Dosing Weight: 70.9 kg  Labs: Recent Labs    09/01/19 0359 09/02/19 0400 09/02/19 1621 09/03/19 0308  HGB 12.3 10.2*  --  10.5*  HCT 37.5 32.1*  --  32.6*  PLT 129* 127*  --  176  APTT 70*  --  43* 48*  HEPARINUNFRC 0.49  --  0.28* 0.24*  CREATININE 1.34* 1.20*  --  1.27*    Assessment: 85 y.o. female with AFib on Eliquis PTA and was transitioned to IV heparin on admit. Heparin was turned off for gastrostomy tube placement and LoA 11/13, resumed 11/14 am.  APTT this am 48 sec, heparin level 0.24 units/ml  Goal of Therapy:  Heparin level 0.3 - 0.5 units/mL APTT 66 - 85 sec Monitor platelets by anticoagulation protocol: Yes   Plan:  -Increase heparin to 1450 units/hr -Recheck heparin level and aPTT in 6-8 hours  Thanks for allowing pharmacy to be a part of this patient's care.  Excell Seltzer, PharmD Clinical Pharmacist  09/03/2019

## 2019-09-03 NOTE — Progress Notes (Signed)
2 Days Post-Op   Subjective/Chief Complaint: Comfortable from an abdominal standpoint   Objective: Vital signs in last 24 hours: Temp:  [97.6 F (36.4 C)-98.1 F (36.7 C)] 97.6 F (36.4 C) (11/15 0837) Pulse Rate:  [82-155] 84 (11/15 0644) Resp:  [20-27] 21 (11/15 0644) BP: (118-129)/(59-77) 129/71 (11/15 0837) SpO2:  [91 %-96 %] 96 % (11/15 0644) Weight:  [78.4 kg] 78.4 kg (11/15 0624) Last BM Date: 08/29/19  Intake/Output from previous day: 11/14 0701 - 11/15 0700 In: 2388.2 [I.V.:2388.2] Out: 1900 [Urine:1850; Drains:50] Intake/Output this shift: No intake/output data recorded.  Exam: Awake and alert Abdomen soft, G-tube in place, working well  Lab Results:  Recent Labs    09/02/19 0400 09/03/19 0308  WBC 9.4 9.4  HGB 10.2* 10.5*  HCT 32.1* 32.6*  PLT 127* 176   BMET Recent Labs    09/02/19 0400 09/03/19 0308  NA 135 139  K 4.0 3.3*  CL 103 107  CO2 21* 22  GLUCOSE 140* 98  BUN 19 21  CREATININE 1.20* 1.27*  CALCIUM 8.2* 8.4*   PT/INR No results for input(s): LABPROT, INR in the last 72 hours. ABG No results for input(s): PHART, HCO3 in the last 72 hours.  Invalid input(s): PCO2, PO2  Studies/Results: No results found.  Anti-infectives: Anti-infectives (From admission, onward)   Start     Dose/Rate Route Frequency Ordered Stop   09/01/19 1400  ciprofloxacin (CIPRO) IVPB 400 mg     400 mg 200 mL/hr over 60 Minutes Intravenous On call to O.R. 09/01/19 0738 09/01/19 1615      Assessment/Plan: s/p Procedure(s): INSERTION OF GASTROSTOMY TUBE (N/A) LAPAROSCOPIC, POSSIBLE OPEN, GASTROPEXY (N/A) Laparoscopic Lysis Of Adhesions  Will start liquids tomorrow Continue G tube to gravity drain  LOS: 5 days    Coralie Keens 09/03/2019

## 2019-09-04 LAB — BASIC METABOLIC PANEL
Anion gap: 11 (ref 5–15)
BUN: 20 mg/dL (ref 8–23)
CO2: 22 mmol/L (ref 22–32)
Calcium: 8.4 mg/dL — ABNORMAL LOW (ref 8.9–10.3)
Chloride: 103 mmol/L (ref 98–111)
Creatinine, Ser: 1.24 mg/dL — ABNORMAL HIGH (ref 0.44–1.00)
GFR calc Af Amer: 47 mL/min — ABNORMAL LOW (ref >60)
GFR calc non Af Amer: 40 mL/min — ABNORMAL LOW (ref >60)
Glucose, Bld: 104 mg/dL — ABNORMAL HIGH (ref 70–99)
Potassium: 3.8 mmol/L (ref 3.5–5.1)
Sodium: 136 mmol/L (ref 135–145)

## 2019-09-04 LAB — CBC WITH DIFFERENTIAL/PLATELET
Abs Immature Granulocytes: 0.04 10*3/uL (ref 0.00–0.07)
Basophils Absolute: 0 10*3/uL (ref 0.0–0.1)
Basophils Relative: 0 %
Eosinophils Absolute: 0.2 10*3/uL (ref 0.0–0.5)
Eosinophils Relative: 2 %
HCT: 32.5 % — ABNORMAL LOW (ref 36.0–46.0)
Hemoglobin: 10.4 g/dL — ABNORMAL LOW (ref 12.0–15.0)
Immature Granulocytes: 0 %
Lymphocytes Relative: 19 %
Lymphs Abs: 1.7 10*3/uL (ref 0.7–4.0)
MCH: 30 pg (ref 26.0–34.0)
MCHC: 32 g/dL (ref 30.0–36.0)
MCV: 93.7 fL (ref 80.0–100.0)
Monocytes Absolute: 1.2 10*3/uL — ABNORMAL HIGH (ref 0.1–1.0)
Monocytes Relative: 13 %
Neutro Abs: 5.9 10*3/uL (ref 1.7–7.7)
Neutrophils Relative %: 66 %
Platelets: 188 10*3/uL (ref 150–400)
RBC: 3.47 MIL/uL — ABNORMAL LOW (ref 3.87–5.11)
RDW: 13.7 % (ref 11.5–15.5)
WBC: 9 10*3/uL (ref 4.0–10.5)
nRBC: 0 % (ref 0.0–0.2)

## 2019-09-04 LAB — HEPARIN LEVEL (UNFRACTIONATED)
Heparin Unfractionated: 0.45 IU/mL (ref 0.30–0.70)
Heparin Unfractionated: 0.59 IU/mL (ref 0.30–0.70)

## 2019-09-04 LAB — GLUCOSE, CAPILLARY
Glucose-Capillary: 98 mg/dL (ref 70–99)
Glucose-Capillary: 92 mg/dL (ref 70–99)
Glucose-Capillary: 85 mg/dL (ref 70–99)
Glucose-Capillary: 100 mg/dL — ABNORMAL HIGH (ref 70–99)

## 2019-09-04 MED ORDER — TRAMADOL HCL 50 MG PO TABS
50.0000 mg | ORAL_TABLET | Freq: Four times a day (QID) | ORAL | Status: DC | PRN
  Administered 2019-09-05 – 2019-09-06 (×3): 50 mg via ORAL
  Administered 2019-09-06 – 2019-09-11 (×2): 100 mg via ORAL
  Administered 2019-09-16 – 2019-09-17 (×2): 50 mg via ORAL
  Filled 2019-09-04 (×2): qty 1
  Filled 2019-09-04: qty 2
  Filled 2019-09-04: qty 1
  Filled 2019-09-04: qty 2
  Filled 2019-09-04 (×2): qty 1

## 2019-09-04 MED ORDER — ACETAMINOPHEN 325 MG PO TABS: 650.0000 mg | ORAL_TABLET | Freq: Four times a day (QID) | ORAL | Status: DC | PRN

## 2019-09-04 NOTE — Progress Notes (Addendum)
ANTICOAGULATION CONSULT NOTE - Follow Up Consult  Pharmacy Consult for heparin Indication: atrial fibrillation  Allergies  Allergen Reactions  . Aspirin Other (See Comments)    Red splotches  . Atorvastatin Other (See Comments)    Muscle spams  . Nabumetone Itching  . Nsaids     Reaction (??)  . Pravastatin Other (See Comments)    Stiff walking difficulty Stiff walking difficulty  . Procaine Swelling  . Rosuvastatin Nausea Only and Other (See Comments)    Reflux and Muscle stiffness  . Simvastatin Nausea Only and Other (See Comments)    Reflux and Muscle stiffness  . Statins Other (See Comments)  . Evolocumab Rash  . Mobic [Meloxicam] Rash  . Penicillins Rash    Has patient had a PCN reaction causing immediate rash, facial/tongue/throat swelling, SOB or lightheadedness with hypotension: Yes Has patient had a PCN reaction causing severe rash involving mucus membranes or skin necrosis: No Has patient had a PCN reaction that required hospitalization: No Has patient had a PCN reaction occurring within the last 10 years: No If all of the above answers are "NO", then may proceed with Cephalosporin use.      Patient Measurements: Height: 5\' 3"  (160 cm) Weight: 172 lb 9.9 oz (78.3 kg) IBW/kg (Calculated) : 52.4 Heparin Dosing Weight: 69 kg   Vital Signs: Temp: 98.1 F (36.7 C) (11/16 0451) Temp Source: Oral (11/16 0738) BP: 136/80 (11/16 0738) Pulse Rate: 88 (11/16 0738)  Labs: Recent Labs    09/02/19 0400  09/02/19 1621 09/03/19 0308 09/03/19 1146 09/03/19 2111 09/04/19 0502 09/04/19 0703  HGB 10.2*  --   --  10.5*  --   --  10.4*  --   HCT 32.1*  --   --  32.6*  --   --  32.5*  --   PLT 127*  --   --  176  --   --  188  --   APTT  --   --  43* 48* 52*  --   --   --   HEPARINUNFRC  --    < > 0.28* 0.24* 0.25* 0.17*  --  0.45  CREATININE 1.20*  --   --  1.27*  --   --  1.24*  --    < > = values in this interval not displayed.    Estimated Creatinine  Clearance: 34.1 mL/min (A) (by C-G formula based on SCr of 1.24 mg/dL (H)).   Medications:  Infusions:  . amiodarone 30 mg/hr (09/04/19 0102)  . dextrose 5 % and 0.45% NaCl 75 mL/hr at 09/03/19 1944  . heparin 1,800 Units/hr (09/03/19 2320)  . methocarbamol (ROBAXIN) IV      Assessment: 85 y.o. female with AFib on Eliquis PTA and was transitioned to IV heparin on admit. Heparin was turned off for gastrostomy tube placement and LoA today 09/01/19.  Received consult to restart IV heparin on 09/02/19 without a bolus. Moving to clear liquids today -heparin level at goal   Goal of Therapy:  Heparin level 0.3-0.5 units/ml aPTT 66-85 seconds Monitor platelets by anticoagulation protocol: Yes   Plan:  -No heparin changes needed -Will confirm a heparin level later today -Daily heparin level and CBC -Consider restarting po apixaban if tolerating po well   Hildred Laser, PharmD Clinical Pharmacist **Pharmacist phone directory can now be found on [email or web address] ([email or web address]).  Listed under [place].

## 2019-09-04 NOTE — Progress Notes (Addendum)
ANTICOAGULATION CONSULT NOTE - Follow Up Consult  Pharmacy Consult for heparin Indication: atrial fibrillation  Allergies  Allergen Reactions  . Aspirin Other (See Comments)    Red splotches  . Atorvastatin Other (See Comments)    Muscle spams  . Nabumetone Itching  . Nsaids     Reaction (??)  . Pravastatin Other (See Comments)    Stiff walking difficulty Stiff walking difficulty  . Procaine Swelling  . Rosuvastatin Nausea Only and Other (See Comments)    Reflux and Muscle stiffness  . Simvastatin Nausea Only and Other (See Comments)    Reflux and Muscle stiffness  . Statins Other (See Comments)  . Evolocumab Rash  . Mobic [Meloxicam] Rash  . Penicillins Rash    Has patient had a PCN reaction causing immediate rash, facial/tongue/throat swelling, SOB or lightheadedness with hypotension: Yes Has patient had a PCN reaction causing severe rash involving mucus membranes or skin necrosis: No Has patient had a PCN reaction that required hospitalization: No Has patient had a PCN reaction occurring within the last 10 years: No If all of the above answers are "NO", then may proceed with Cephalosporin use.      Patient Measurements: Height: 5\' 3"  (160 cm) Weight: 172 lb 9.9 oz (78.3 kg) IBW/kg (Calculated) : 52.4 Heparin Dosing Weight: 69 kg   Vital Signs: Temp: 97.6 F (36.4 C) (11/16 1218) Temp Source: Oral (11/16 1218) BP: 102/89 (11/16 1300) Pulse Rate: 88 (11/16 1218)  Labs: Recent Labs    09/02/19 0400  09/02/19 1621 09/03/19 0308 09/03/19 1146 09/03/19 2111 09/04/19 0502 09/04/19 0703 09/04/19 1443  HGB 10.2*  --   --  10.5*  --   --  10.4*  --   --   HCT 32.1*  --   --  32.6*  --   --  32.5*  --   --   PLT 127*  --   --  176  --   --  188  --   --   APTT  --   --  43* 48* 52*  --   --   --   --   HEPARINUNFRC  --    < > 0.28* 0.24* 0.25* 0.17*  --  0.45 0.59  CREATININE 1.20*  --   --  1.27*  --   --  1.24*  --   --    < > = values in this interval not  displayed.    Estimated Creatinine Clearance: 34.1 mL/min (A) (by C-G formula based on SCr of 1.24 mg/dL (H)).   Assessment: 85 yr old female with AFib on Eliquis PTA and was transitioned to IV heparin on admit. Heparin was turned off for gastrostomy tube placement and LOA on 09/01/19.  Received consult to restart IV heparin on 09/02/19 without a bolus. Pt currently on thin liquid diet.  Heparin level drawn earlier today on heparin infusion at 1800 units/hr was within goal range at 0.45 units/ml. Confirmatory heparin level drawn ~8 hrs later was 0.59 units/ml, which is above the goal range for this pt. CBC stable. Per RN, no issues with IV or bleeding observed.   Goal of Therapy:  Heparin level 0.3-0.5 units/ml aPTT 66-85 seconds Monitor platelets by anticoagulation protocol: Yes   Plan:  Decrease heparin infusion to 1700 units/hr Check 8-hr heparin level Monitor daily heparin level, CBC Monitor for signs/symptoms of bleeding Consider restarting po apixaban if tolerating po well  Gillermina Hu, PharmD, BCPS, [facility] Clinical Pharmacist 09/04/2019, 16:26 PM

## 2019-09-04 NOTE — Progress Notes (Signed)
PROGRESS NOTE  Alyssa Odonnell [id number] DOB: 1933/11/02 DOA: 08/28/2019 PCP: Drake Leach, MD  HPI/Recap of past 24 hours: HPI from [name] 85 year old female, lives with her daughter, ambulates with the help of a walker, PMH of PAF on Eliquis, embolic CVA, HTN, HLD, hypothyroid, PAD, cholecystectomy, presented to [facility] ED on 08/28/2019 due to abrupt onset of severe nausea, dry heaving without vomiting, chest/epigastric abdominal pain.  CT abdomen showed large hiatal hernia with gastric volvulus with gastric outlet obstruction.  Multiple unsuccessful attempts to pass NG tube at bedside.  IR consulted for NG tube under fluoroscopy. General surgery consulted and have tentatively plan for OR on 11/12, if no improvement with medical management.    Today, patient reports feeling better, denies any palpitations, chest pain, shortness of breath, worsening abdominal pain, nausea/vomiting/diarrhea, fever/chills.   Assessment/Plan: Principal Problem:   Volvulus of stomach Active Problems:   PAD (peripheral artery disease) (HCC)   Essential hypertension   PAF (paroxysmal atrial fibrillation) (HCC)   Gastric outlet obstruction  Large hiatal hernia with gastric outlet obstruction s/p laparoscopic gastropexy with placement of gastrostomy tube with extensive lysis of adhesions Afebrile, with resolved leukocytosis CT abdomen/pelvis on 11/10 showed above Advance to clear liquid diet, IV fluids, pain management General surgery on board, defer pain and DVT management  Hypokalemia Replace as needed Daily BMP  Thrombocytopenia Resolved Likely due to acute illness Daily CBC  Paroxysmal A. fib with RVR HR with better control EKG showed A. fib TTE on 02/2017 showed LVEF of 60 to 65% Patient was initially started on diltiazem drip, then switched to amiodarone drip due to hypotension Cardiology on board, continue amiodarone drip, IV Heparin Hold home Eliquis pending surgery recommendation  Cardiology consulted, appreciate recs  Hypertension BP stable Hold home Benicar, hydrochlorothiazide Monitor closely  Hypothyroidism Continue IV Synthroid  CKD stage IIIb Creatinine at baseline Daily BMP       Malnutrition Type:      Malnutrition Characteristics:      Nutrition Interventions:       Estimated body mass index is 30.58 kg/m as calculated from the following:   Height as of this encounter: 5\' 3"  (1.6 m).   Weight as of this encounter: 78.3 kg.     Code Status: Full  Family Communication: None at bedside  Disposition Plan: To be determined   Consultants:  General surgery  Cardiology  Procedures:  None  Antimicrobials:  None  DVT prophylaxis: IV Heparin   Objective: Vitals:   09/04/19 0738 09/04/19 1218 09/04/19 1300 09/04/19 1621  BP: 136/80 133/82 102/89 (!) 149/66  Pulse: 88 88  87  Resp: 20 17 (!) 22 (!) 21  Temp:  97.6 F (36.4 C)  98 F (36.7 C)  TempSrc: Oral Oral    SpO2: 97%   95%  Weight:      Height:        Intake/Output Summary (Last 24 hours) at 09/04/2019 1704 Last data filed at 09/04/2019 1622 Gross per 24 hour  Intake 603.89 ml  Output 1900 ml  Net -1296.11 ml   Filed Weights   09/02/19 0301 09/03/19 0624 09/04/19 0451  Weight: 78.9 kg 78.4 kg 78.3 kg    Exam:  General: NAD, chronically ill-appearing  Cardiovascular: S1, S2 present  Respiratory: CTAB  Abdomen: Soft, nontender, nondistended, laparoscopic surgical sites C/D/, G-tube noted in place, bowel sounds present  Musculoskeletal: No bilateral pedal edema noted  Skin: Normal  Psychiatry: Normal mood   Data Reviewed: CBC: Recent  Labs  Lab 08/29/19 0004  08/31/19 0404 09/01/19 0359 09/02/19 0400 09/03/19 0308 09/04/19 0502  WBC 7.4   < > 10.8* 12.3* 9.4 9.4 9.0  NEUTROABS 5.4  --   --   --  8.1* 6.5 5.9  HGB 11.9*   < > 12.1 12.3 10.2* 10.5* 10.4*  HCT 37.4   < > 37.8 37.5 32.1* 32.6* 32.5*  MCV 95.4   < > 94.0 93.3  93.9 94.2 93.7  PLT 176   < > 140* 129* 127* 176 188   < > = values in this interval not displayed.   Basic Metabolic Panel: Recent Labs  Lab 08/31/19 0404 09/01/19 0359 09/02/19 0400 09/03/19 0308 09/04/19 0502  NA 135 135 135 139 136  K 3.6 3.2* 4.0 3.3* 3.8  CL 103 103 103 107 103  CO2 23 22 21* 22 22  GLUCOSE 107* 117* 140* 98 104*  BUN 12 11 19 21 20   CREATININE 1.21* 1.34* 1.20* 1.27* 1.24*  CALCIUM 8.8* 8.6* 8.2* 8.4* 8.4*   GFR: Estimated Creatinine Clearance: 34.1 mL/min (A) (by C-G formula based on SCr of 1.24 mg/dL (H)). Liver Function Tests: Recent Labs  Lab 08/29/19 0004  AST 16  ALT 14  ALKPHOS 81  BILITOT 0.6  PROT 7.0  ALBUMIN 3.9   Recent Labs  Lab 08/29/19 0004  LIPASE 30   No results for input(s): AMMONIA in the last 168 hours. Coagulation Profile: No results for input(s): INR, PROTIME in the last 168 hours. Cardiac Enzymes: No results for input(s): CKTOTAL, CKMB, CKMBINDEX, TROPONINI in the last 168 hours. BNP (last 3 results) No results for input(s): PROBNP in the last 8760 hours. HbA1C: No results for input(s): HGBA1C in the last 72 hours. CBG: Recent Labs  Lab 09/03/19 1551 09/03/19 2127 09/04/19 0737 09/04/19 1217 09/04/19 1621  GLUCAP 90 83 98 92 85   Lipid Profile: No results for input(s): CHOL, HDL, LDLCALC, TRIG, CHOLHDL, LDLDIRECT in the last 72 hours. Thyroid Function Tests: No results for input(s): TSH, T4TOTAL, FREET4, T3FREE, THYROIDAB in the last 72 hours. Anemia Panel: No results for input(s): VITAMINB12, FOLATE, FERRITIN, TIBC, IRON, RETICCTPCT in the last 72 hours. Urine analysis: No results found for: COLORURINE, APPEARANCEUR, LABSPEC, PHURINE, GLUCOSEU, HGBUR, BILIRUBINUR, KETONESUR, PROTEINUR, UROBILINOGEN, NITRITE, LEUKOCYTESUR Sepsis Labs: @LABRCNTIP (procalcitonin:4,lacticidven:4)  ) Recent Results (from the past 240 hour(s))  SARS CORONAVIRUS 2 (TAT 6-24 HRS) Nasopharyngeal Nasopharyngeal Swab      Status: None   Collection Time: 08/29/19  5:30 AM   Specimen: Nasopharyngeal Swab  Result Value Ref Range Status   SARS Coronavirus 2 NEGATIVE NEGATIVE Final    Comment: (NOTE) SARS-CoV-2 target nucleic acids are NOT DETECTED. The SARS-CoV-2 RNA is generally detectable in upper and lower respiratory specimens during the acute phase of infection. Negative results do not preclude SARS-CoV-2 infection, do not rule out co-infections with other pathogens, and should not be used as the sole basis for treatment or other patient management decisions. Negative results must be combined with clinical observations, patient history, and epidemiological information. The expected result is Negative. Fact Sheet for Patients: SugarRoll.be Fact Sheet for Healthcare Providers: https://www.woods-mathews.com/ This test is not yet approved or cleared by the [place] FDA and  has been authorized for detection and/or diagnosis of SARS-CoV-2 by FDA under an Emergency Use Authorization (EUA). This EUA will remain  in effect (meaning this test can be used) for the duration of the COVID-19 declaration under Section 56 4(b)(1) of the Act, 21 U.S.C.  section 360bbb-3(b)(1), unless the authorization is terminated or revoked sooner. Performed at [facility], [place] [address], [place], [place] [address]   Surgical pcr screen     Status: None   Collection Time: 09/01/19  2:16 PM   Specimen: Nasal Mucosa; Nasal Swab  Result Value Ref Range Status   MRSA, PCR NEGATIVE NEGATIVE Final   Staphylococcus aureus NEGATIVE NEGATIVE Final    Comment: (NOTE) The Xpert SA Assay (FDA approved for NASAL specimens in patients 58 years of age and older), is one component of a comprehensive surveillance program. It is not intended to diagnose infection nor to guide or monitor treatment. Performed at [facility], [place] [address], [place],  [address]        Studies: No results found.  Scheduled Meds: . chlorhexidine  15 mL Mouth Rinse BID  . Chlorhexidine Gluconate Cloth  6 each Topical Daily  . latanoprost  1 drop Both Eyes QHS  . levothyroxine  50 mcg Intravenous Daily  . mouth rinse  15 mL Mouth Rinse q12n4p  . pantoprazole (PROTONIX) IV  40 mg Intravenous Q24H    Continuous Infusions: . amiodarone 30 mg/hr (09/04/19 0102)  . dextrose 5 % and 0.45% NaCl 75 mL/hr at 09/03/19 1944  . heparin 1,800 Units/hr (09/03/19 2320)  . methocarbamol (ROBAXIN) IV       LOS: 6 days     Alma Friendly, MD [facility]  If 7PM-7AM, please contact night-coverage [email or web address] 09/04/2019, 5:04 PM

## 2019-09-04 NOTE — Addendum Note (Signed)
Addendum  created 09/04/19 [id number] by Candis Shine, CRNA   Intraprocedure Event edited

## 2019-09-04 NOTE — Progress Notes (Signed)
Alyssa Odonnell Surgery Progress Note  3 Days Post-Op  Subjective: Patient reports some mild abdominal tenderness but overall not much pain. Denies nausea. Had a BM yesterday.   Objective: Vital signs in last 24 hours: Temp:  [97.6 F (36.4 C)-99.3 F (37.4 C)] 98.1 F (36.7 C) (11/16 0451) Pulse Rate:  [88-141] 88 (11/16 0738) Resp:  [20-27] 20 (11/16 0738) BP: (117-136)/(68-81) 136/80 (11/16 0738) SpO2:  [93 %-97 %] 97 % (11/16 0738) Weight:  [78.3 kg] 78.3 kg (11/16 0451) Last BM Date: 09/03/19  Intake/Output from previous day: 11/15 0701 - 11/16 0700 In: 1138.2 [I.V.:738.2; IV Piggyback:400] Out: 1500 [Urine:1500] Intake/Output this shift: No intake/output data recorded.  PE: Gen:  Alert, NAD, pleasant Pulm:  Normal effort Abd: Soft, non-tender, non-distended, +BS, incisions c/d/i, G-tube present with minimal drainage Skin: warm and dry, no rashes  Psych: A&Ox3   Lab Results:  Recent Labs    09/03/19 0308 09/04/19 0502  WBC 9.4 9.0  HGB 10.5* 10.4*  HCT 32.6* 32.5*  PLT 176 188   BMET Recent Labs    09/03/19 0308 09/04/19 0502  NA 139 136  K 3.3* 3.8  CL 107 103  CO2 22 22  GLUCOSE 98 104*  BUN 21 20  CREATININE 1.27* 1.24*  CALCIUM 8.4* 8.4*   PT/INR No results for input(s): LABPROT, INR in the last 72 hours. CMP     Component Value Date/Time   NA 136 09/04/2019 0502   NA 141 03/03/2018 1352   K 3.8 09/04/2019 0502   CL 103 09/04/2019 0502   CO2 22 09/04/2019 0502   GLUCOSE 104 (H) 09/04/2019 0502   BUN 20 09/04/2019 0502   BUN 31 (H) 03/03/2018 1352   CREATININE 1.24 (H) 09/04/2019 0502   CALCIUM 8.4 (L) 09/04/2019 0502   PROT 7.0 08/29/2019 0004   ALBUMIN 3.9 08/29/2019 0004   AST 16 08/29/2019 0004   ALT 14 08/29/2019 0004   ALKPHOS 81 08/29/2019 0004   BILITOT 0.6 08/29/2019 0004   GFRNONAA 40 (L) 09/04/2019 0502   GFRAA 47 (L) 09/04/2019 0502   Lipase     Component Value Date/Time   LIPASE 30 08/29/2019 0004        Studies/Results: No results found.  Anti-infectives: Anti-infectives (From admission, onward)   Start     Dose/Rate Route Frequency Ordered Stop   09/01/19 1400  ciprofloxacin (CIPRO) IVPB 400 mg     400 mg 200 mL/hr over 60 Minutes Intravenous On call to O.R. 09/01/19 [id number] 09/01/19 1615       Assessment/Plan Hx CVA Hypothyroidism HTN HLD Paroxysmal A Fib on Elquis -eliquis on hold, on IV heparin Afib with RVR - starting cardizem drip. Cardiology on board  - Above per May with Gastric Outlet Obstruction S/p laparoscopic gastropexy with g-tube, LOA 11/13 Dr. Brantley Stage - POD#3 - ok to start CLD today, patient having bowel function - can try capping G-tube today - mobilize! - d/c foley today  FEN -CLD IVF VTE -SCDs, Heparingtt ID -cipro pre-op   LOS: 6 days    Brigid Re , [facility] Surgery 09/04/2019, 7:59 AM Please see Amion for pager number during day hours 7:00am-4:30pm

## 2019-09-04 NOTE — TOC Initial Note (Signed)
Transition of Care [facility]) - Initial/Assessment Note    Patient Details  Name: Alyssa Odonnell MRN: [id number] Date of Birth: 1934-03-15  [facility] of [facility] CM/SW Contact:    Eileen Stanford, LCSW Phone Number: 09/04/2019, 3:43 PM  Clinical Narrative:    Pt is alert and oriented. Pt lives with her daughter, however pt states her daughter's health is not that great. Pt is agreeable to SNF. Pt wants to speak with her PCP about recommendations for SNF prior to decision. Pt verbalized permission for CSW to call daughter to discuss SNF as well. CSW spoke with pt's daughter. Pt's daughter is going to call pt's PCP and get recommendations and let CSW know.             Expected Discharge Plan: Skilled Nursing Facility Barriers to Discharge: Continued Medical Work up   Patient Goals and CMS Choice Patient states their goals for this hospitalization and ongoing recovery are:: "to go to rehab" CMS Medicare.gov Compare Post Acute Care list provided to:: Patient Represenative (must comment) Choice offered to / list presented to : Adult Children  Expected Discharge Plan and Services Expected Discharge Plan: [place] In-house Referral: NA   Post Acute Care Choice: [place] Living arrangements for the past 2 months: Single Family Home                                      Prior Living Arrangements/Services Living arrangements for the past 2 months: Single Family Home Lives with:: Adult Children Patient language and need for interpreter reviewed:: Yes Do you feel safe going back to the place where you live?: Yes      Need for Family Participation in Patient Care: Yes (Comment) Care giver support system in place?: Yes (comment)   Criminal Activity/Legal Involvement Pertinent to Current Situation/Hospitalization: No - Comment as needed  Activities of Daily Living Home Assistive Devices/Equipment: Walker (specify type), Eyeglasses ADL Screening  (condition at time of admission) Patient's cognitive ability adequate to safely complete daily activities?: Yes Is the patient deaf or have difficulty hearing?: No Does the patient have difficulty seeing, even when wearing glasses/contacts?: No Does the patient have difficulty concentrating, remembering, or making decisions?: No Patient able to express need for assistance with ADLs?: Yes Does the patient have difficulty dressing or bathing?: No Independently performs ADLs?: Yes (appropriate for developmental age) Does the patient have difficulty walking or climbing stairs?: Yes Weakness of Legs: Both Weakness of Arms/Hands: None  Permission Sought/Granted Permission sought to share information with : Family Supports Permission granted to share information with : Yes, Verbal Permission Granted  Share Information with NAME: Dawn     Permission granted to share info w Relationship: Daughter  Permission granted to share info w Contact Information: [phone]  Emotional Assessment Appearance:: Appears stated age Attitude/Demeanor/Rapport: Engaged Affect (typically observed): Accepting, Appropriate, Calm Orientation: : Oriented to Self, Oriented to Place, Oriented to  Time, Oriented to Situation Alcohol / Substance Use: Not Applicable Psych Involvement: No (comment)  Admission diagnosis:  Hiatal hernia [K44.9] Gastric outlet obstruction [K31.1] Patient Active Problem List   Diagnosis Date Noted  . 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 Gastric outlet obstruction 08/29/2019  . PAF (paroxysmal atrial fibrillation) ([place]) 02/02/2018  . 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 Cerebrovascular accident (CVA) due to embolism of left posterior cerebral artery ([place]) 03/09/2017  . htn 03/09/2017  .  Weakness 03/09/2017  . Stroke-like symptom 03/09/2017  . Right sided weakness   . PAD (peripheral artery disease) ([place])   . Essential hypertension   . Spinal stenosis of lumbar region   . Pure  hypercholesterolemia   . Recurrent major depressive disorder ([place])    PCP:  Drake Leach, MD Pharmacy:   [facility], [place] [address] [place] [address] Phone: [phone] Fax: [facility] [address] [place]), [place] [id number] [place] ([place]) [place] [address] Phone: [phone] Fax: [phone]     Social Determinants of Health (SDOH) Interventions    Readmission Risk Interventions No flowsheet data found.

## 2019-09-04 NOTE — Care Management Important Message (Signed)
Important Message  Patient Details  Name: Alyssa Odonnell MRN: [id number] Date of Birth: [birth year]   Medicare Important Message Given:  Yes     [name] 09/04/2019, 2:30 PM

## 2019-09-04 NOTE — Progress Notes (Addendum)
Progress Note  Patient Name: Alyssa Odonnell Date of Encounter: 09/04/2019  Primary Cardiologist: Cristopher Peru, MD   Subjective   Patient is feeling better this morning. Still sore in the abdomen. Overnight patient converted to afib multiple times. Denies CP or SOB.   On my exam patient is in maintaining NSR.   Inpatient Medications    Scheduled Meds: . chlorhexidine  15 mL Mouth Rinse BID  . Chlorhexidine Gluconate Cloth  6 each Topical Daily  . latanoprost  1 drop Both Eyes QHS  . levothyroxine  50 mcg Intravenous Daily  . mouth rinse  15 mL Mouth Rinse q12n4p  . pantoprazole (PROTONIX) IV  40 mg Intravenous Q24H   Continuous Infusions: . amiodarone 30 mg/hr (09/04/19 0102)  . dextrose 5 % and 0.45% NaCl 75 mL/hr at 09/03/19 1944  . heparin 1,800 Units/hr (09/03/19 2320)  . methocarbamol (ROBAXIN) IV     PRN Meds: [DISCONTINUED] acetaminophen **OR** acetaminophen, hydrALAZINE, methocarbamol (ROBAXIN) IV, morphine injection, [DISCONTINUED] ondansetron **OR** ondansetron (ZOFRAN) IV, promethazine, white petrolatum   Vital Signs    Vitals:   09/03/19 2327 09/03/19 2346 09/04/19 0451 09/04/19 0738  BP:  118/69 128/68 136/80  Pulse: 93 93 88 88  Resp: (!) 27 (!) 24 (!) 24 20  Temp:   98.1 F (36.7 C)   TempSrc:   Oral Oral  SpO2: 97% 97% 96% 97%  Weight:   78.3 kg   Height:        Intake/Output Summary (Last 24 hours) at 09/04/2019 0744 Last data filed at 09/04/2019 0456 Gross per 24 hour  Intake 1138.15 ml  Output 1500 ml  Net -361.85 ml   Last 3 Weights 09/04/2019 09/03/2019 09/02/2019  Weight (lbs) 172 lb 9.9 oz 172 lb 13.5 oz 173 lb 15.1 oz  Weight (kg) 78.3 kg 78.4 kg 78.9 kg      Telemetry    NSR with runs of afib, HR up to 120s. NSR on my exam, HR in hte 90s - Personally Reviewed  ECG    Pending - Personally Reviewed  Physical Exam   GEN: No acute distress.   Neck: No JVD Cardiac: RRR, no murmurs, rubs, or gallops.  Respiratory: Clear to  auscultation bilaterally; 1-2L O2 GI: Soft, nontender, non-distended  MS: Mild lower leg edema; No deformity. Neuro:  Nonfocal  Psych: Normal affect   Labs    High Sensitivity Troponin:   Recent Labs  Lab 08/29/19 0004 08/29/19 0409  TROPONINIHS 6 8      Chemistry Recent Labs  Lab 08/29/19 0004  09/02/19 0400 09/03/19 0308 09/04/19 0502  NA 140   < > 135 139 136  K 3.9   < > 4.0 3.3* 3.8  CL 106   < > 103 107 103  CO2 23   < > 21* 22 22  GLUCOSE 130*   < > 140* 98 104*  BUN 22   < > 19 21 20   CREATININE 1.65*   < > 1.20* 1.27* 1.24*  CALCIUM 9.5   < > 8.2* 8.4* 8.4*  PROT 7.0  --   --   --   --   ALBUMIN 3.9  --   --   --   --   AST 16  --   --   --   --   ALT 14  --   --   --   --   ALKPHOS 81  --   --   --   --  BILITOT 0.6  --   --   --   --   GFRNONAA 28*   < > 42* 39* 40*  GFRAA 33*   < > 48* 45* 47*  ANIONGAP 11   < > 11 10 11    < > = values in this interval not displayed.     Hematology Recent Labs  Lab 09/02/19 0400 09/03/19 0308 09/04/19 0502  WBC 9.4 9.4 9.0  RBC 3.42* 3.46* 3.47*  HGB 10.2* 10.5* 10.4*  HCT 32.1* 32.6* 32.5*  MCV 93.9 94.2 93.7  MCH 29.8 30.3 30.0  MCHC 31.8 32.2 32.0  RDW 13.2 13.3 13.7  PLT 127* 176 188    BNPNo results for input(s): BNP, PROBNP in the last 168 hours.   DDimer No results for input(s): DDIMER in the last 168 hours.   Radiology    No results found.  Cardiac Studies   Echo 2018 Left ventricle: The cavity size was normal. Systolic function was normal. The estimated ejection fraction was in the range of 60% to 65%. Wall motion was normal; there were no regional wall motion abnormalities. Left ventricular diastolic function parameters were normal. - Aortic valve: There was very mild stenosis. There was no regurgitation. Peak velocity (S): 211 cm/s. Mean gradient (S): 10 mm Hg. Valve area (VTI): 1.49 cm^2. Valve area (Vmax): 1.36 cm^2. Valve area (Vmean): 1.34 cm^2. - Mitral valve:  Transvalvular velocity was within the normal range. There was no evidence for stenosis. There was no regurgitation. - Right ventricle: The cavity size was normal. Wall thickness was normal. Systolic function was normal. - Atrial septum: No defect or patent foramen ovale was identified by color flow Doppler. - Tricuspid valve: There was mild regurgitation. - Pulmonary arteries: Systolic pressure was within the normal range. PA peak pressure: 23 mm Hg (S).  Patient Profile     85 y.o. female with a hx of HTN, PAF on Eliquis, CVA, HLD, CKD III, mild AS, hypothyroidism who was seen by cardiology for surgical clearance for surgery for hiatal hernia  Assessment & Plan    1. Paroxysmal atrial fibrillation with RVR  - hospital course has been complicated by Afib RVR with rates generally 100-110s - She was initially started on a diltiazem gtt and transitioned to amiodarone gtt due to hypotension.  - Patient was in and out of afib overnight. She can feel when she converts. Denies CP or SOB. - Continue amiodarone gtt. Now on 30 mg/hr>> can consolidate once patient is PO - Continue heparin gtt >> can transition to home dose Eliquis once cleared by surgery  2. Preoperative assessment - patient presented with abdominal pain, found to have a large hiatal hernia with gastric outlet obstruction.  - Surgery was held for Afib RVR - Patient is now post-op day 3 - suspect Afib will improve after management of the underlying condition - Continue to monitor volume status >> appears slightly volume up on exam  3. HTN  - BP stable. Home benicar on hold - Continue to monitor closely in the perioperative setting  4. Aortic Stenosis  - mild on echo 2018.  - No complaints of chest pain or syncope to suggest progression of disease - Will continue to monitor routinely outpatient   For questions or updates, please contact [place] Please consult [email or web address] for contact info under         Signed, Cadence Ninfa Meeker, PA-C  09/04/2019, 7:44 AM    Personally seen and examined. Agree with above.  Starting to have BM.  Mild abdominal tenderness.  Alert and oriented, 2/6 systolic murmur  Atrial fibrillation paroxysmal -Continue with amiodarone drip for now.  Clear liquids have been started by general surgery.  Soon will be able to convert to p.o. amiodarone.  She is in and out of atrial fibrillation.  Telemetry personally reviewed.  Now in sinus rhythm.  Chronic anticoagulation -IV heparin, Eliquis on hold  Mild aortic stenosis -Follow clinically  Post volvulus, lysis of adhesions.  Spoke with Dr. Ninfa Linden with surgery.  Clear liquids.   Candee Furbish, MD

## 2019-09-04 NOTE — NC FL2 (Signed)
[place] LEVEL OF CARE SCREENING TOOL     IDENTIFICATION  Patient Name: Alyssa Odonnell Birthdate: 1933-12-09 Sex: female Admission Date (Current Location): 08/28/2019  [facility] and [place] Number:  Herbalist and Address:  The [place], [place] [address], [place], [place] [address]      Provider Number: [id number]  Attending Physician Name and Address:  Alma Friendly, MD  Relative Name and Phone Number:       Current Level of Care: Hospital Recommended Level of Care: [place] Prior Approval Number:    Date Approved/Denied:   PASRR Number: [id number]  Discharge Plan: SNF    Current Diagnoses: Patient Active Problem List   Diagnosis Date Noted  . 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 Gastric outlet obstruction 08/29/2019  . PAF (paroxysmal atrial fibrillation) ([place]) 02/02/2018  . 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[place]) 05/24/2017  . Cerebrovascular accident (CVA) due to embolism of left posterior cerebral artery ([place]) 03/09/2017  . htn 03/09/2017  . Weakness 03/09/2017  . Stroke-like symptom 03/09/2017  . Right sided weakness   . PAD (peripheral artery disease) ([place])   . Essential hypertension   . Spinal stenosis of lumbar region   . Pure hypercholesterolemia   . Recurrent major depressive disorder (HCC)     Orientation RESPIRATION BLADDER Height & Weight     Self, Time, Situation, Place  O2(nasal cannula 2L) Continent Weight: 172 lb 9.9 oz (78.3 kg) Height:  5\' 3"  (160 cm)  BEHAVIORAL SYMPTOMS/MOOD NEUROLOGICAL BOWEL NUTRITION STATUS      Incontinent Diet(clear liquid, subject to change, see d/c summary)  AMBULATORY STATUS COMMUNICATION OF NEEDS Skin   Limited Assist Verbally Surgical wounds(closed incision, abdomen, liquid skin adhesive)                       Personal Care Assistance Level of Assistance  Bathing, Feeding, Dressing Bathing Assistance: Limited assistance Feeding assistance:  Independent Dressing Assistance: Limited assistance     Functional Limitations Info  Sight, Hearing, Speech Sight Info: Adequate Hearing Info: Adequate Speech Info: Adequate    SPECIAL CARE FACTORS FREQUENCY  PT (By licensed PT), OT (By licensed OT)     PT Frequency: 5x OT Frequency: 5x            Contractures Contractures Info: Not present    Additional Factors Info  Code Status, Allergies Code Status Info: Full COde Allergies Info: Aspirin, Atorvastatin, Nabumetone, Nsaids, Pravastatin, Procaine, Rosuvastatin, Simvastatin, Statins, Evolocumab, Mobic (Meloxicam), Penicillins           Current Medications (09/04/2019):  This is the current hospital active medication list Current Facility-Administered Medications  Medication Dose Route Frequency Provider Last Rate Last Dose  . acetaminophen (TYLENOL) tablet 650 mg  650 mg Oral Q6H PRN Rayburn, Kelly A, PA-C      . amiodarone (NEXTERONE PREMIX) 360-4.14 MG/200ML-% (1.8 mg/mL) IV infusion  30 mg/hr Intravenous Continuous Jillyn Ledger, PA-C 16.67 mL/hr at 09/04/19 0102 30 mg/hr at 09/04/19 0102  . chlorhexidine (PERIDEX) 0.12 % solution 15 mL  15 mL Mouth Rinse BID Jillyn Ledger, PA-C   15 mL at 09/03/19 2114  . Chlorhexidine Gluconate Cloth 2 % PADS 6 each  6 each Topical Daily Alma Friendly, MD   6 each at 09/02/19 1615  . dextrose 5 %-0.45 % sodium chloride infusion   Intravenous Continuous Alma Friendly, MD 75 mL/hr at 09/03/19 1944    . heparin  ADULT infusion 100 units/mL (25000 units/211mL sodium chloride 0.45%)  1,800 Units/hr Intravenous Continuous Einar Grad, RPH 18 mL/hr at 09/03/19 2320 1,800 Units/hr at 09/03/19 2320  . hydrALAZINE (APRESOLINE) injection 10 mg  10 mg Intravenous Q4H PRN Maczis, Barth Kirks, PA-C      . latanoprost (XALATAN) 0.005 % ophthalmic solution 1 drop  1 drop Both Eyes QHS Jillyn Ledger, PA-C   1 drop at 09/03/19 2113  . levothyroxine (SYNTHROID, LEVOTHROID)  injection 50 mcg  50 mcg Intravenous Daily Jillyn Ledger, PA-C   50 mcg at 09/04/19 1041  . MEDLINE mouth rinse  15 mL Mouth Rinse q12n4p Jillyn Ledger, PA-C   15 mL at 09/03/19 1610  . methocarbamol (ROBAXIN) 500 mg in dextrose 5 % 50 mL IVPB  500 mg Intravenous Q6H PRN Maczis, Barth Kirks, PA-C      . morphine 2 MG/ML injection 2 mg  2 mg Intravenous Q3H PRN Jillyn Ledger, PA-C   2 mg at 09/04/19 1040  . ondansetron (ZOFRAN) injection 4 mg  4 mg Intravenous Q6H PRN Jillyn Ledger, PA-C   4 mg at 08/31/19 1501  . pantoprazole (PROTONIX) injection 40 mg  40 mg Intravenous Q24H Jillyn Ledger, PA-C   40 mg at 09/04/19 1041  . promethazine (PHENERGAN) injection 12.5 mg  12.5 mg Intravenous Q6H PRN Jillyn Ledger, PA-C   12.5 mg at 09/02/19 2216  . traMADol (ULTRAM) tablet 50-100 mg  50-100 mg Oral Q6H PRN Rayburn, Kelly A, PA-C      . white petrolatum (VASELINE) gel   Topical PRN Alma Friendly, MD         Discharge Medications: Please see discharge summary for a list of discharge medications.  Relevant Imaging Results:  Relevant Lab Results:   Additional Information SSN:[id number]  Eileen Stanford, LCSW

## 2019-09-05 ENCOUNTER — Encounter (HOSPITAL_COMMUNITY): Payer: Self-pay | Admitting: Surgery

## 2019-09-05 LAB — GLUCOSE, CAPILLARY
Glucose-Capillary: 98 mg/dL (ref 70–99)
Glucose-Capillary: 131 mg/dL — ABNORMAL HIGH (ref 70–99)
Glucose-Capillary: 85 mg/dL (ref 70–99)

## 2019-09-05 LAB — CBC WITH DIFFERENTIAL/PLATELET
Abs Immature Granulocytes: 0.07 10*3/uL (ref 0.00–0.07)
Basophils Absolute: 0 10*3/uL (ref 0.0–0.1)
Basophils Relative: 0 %
Eosinophils Absolute: 0.3 10*3/uL (ref 0.0–0.5)
Eosinophils Relative: 3 %
HCT: 31 % — ABNORMAL LOW (ref 36.0–46.0)
Hemoglobin: 9.9 g/dL — ABNORMAL LOW (ref 12.0–15.0)
Immature Granulocytes: 1 %
Lymphocytes Relative: 23 %
Lymphs Abs: 1.9 10*3/uL (ref 0.7–4.0)
MCH: 29.6 pg (ref 26.0–34.0)
MCHC: 31.9 g/dL (ref 30.0–36.0)
MCV: 92.8 fL (ref 80.0–100.0)
Monocytes Absolute: 1 10*3/uL (ref 0.1–1.0)
Monocytes Relative: 12 %
Neutro Abs: 5.1 10*3/uL (ref 1.7–7.7)
Neutrophils Relative %: 61 %
Platelets: 203 10*3/uL (ref 150–400)
RBC: 3.34 MIL/uL — ABNORMAL LOW (ref 3.87–5.11)
RDW: 13.7 % (ref 11.5–15.5)
WBC: 8.4 10*3/uL (ref 4.0–10.5)
nRBC: 0 % (ref 0.0–0.2)

## 2019-09-05 LAB — BASIC METABOLIC PANEL
Anion gap: 10 (ref 5–15)
BUN: 13 mg/dL (ref 8–23)
CO2: 23 mmol/L (ref 22–32)
Calcium: 8.3 mg/dL — ABNORMAL LOW (ref 8.9–10.3)
Chloride: 106 mmol/L (ref 98–111)
Creatinine, Ser: 1.13 mg/dL — ABNORMAL HIGH (ref 0.44–1.00)
GFR calc Af Amer: 52 mL/min — ABNORMAL LOW (ref >60)
GFR calc non Af Amer: 45 mL/min — ABNORMAL LOW (ref >60)
Glucose, Bld: 110 mg/dL — ABNORMAL HIGH (ref 70–99)
Potassium: 3.3 mmol/L — ABNORMAL LOW (ref 3.5–5.1)
Sodium: 139 mmol/L (ref 135–145)

## 2019-09-05 LAB — HEPARIN LEVEL (UNFRACTIONATED)
Heparin Unfractionated: 0.59 IU/mL (ref 0.30–0.70)
Heparin Unfractionated: 0.43 IU/mL (ref 0.30–0.70)

## 2019-09-05 MED ORDER — METHOCARBAMOL 500 MG PO TABS
500.0000 mg | ORAL_TABLET | Freq: Three times a day (TID) | ORAL | Status: DC | PRN
  Administered 2019-09-05 – 2019-09-17 (×3): 500 mg via ORAL
  Filled 2019-09-05 (×3): qty 1

## 2019-09-05 MED ORDER — ENSURE ENLIVE PO LIQD
237.0000 mL | Freq: Two times a day (BID) | ORAL | Status: DC
  Administered 2019-09-05 – 2019-09-23 (×12): 237 mL via ORAL

## 2019-09-05 MED ORDER — SODIUM CHLORIDE 0.9% FLUSH: 10.0000 mL | INTRAVENOUS | Status: DC | PRN

## 2019-09-05 MED ORDER — DEXTROSE-NACL 5-0.45 % IV SOLN
INTRAVENOUS | Status: DC | PRN
  Administered 2019-09-05: 12:00:00 via INTRAVENOUS

## 2019-09-05 MED ORDER — SODIUM CHLORIDE 0.9% FLUSH
10.0000 mL | Freq: Two times a day (BID) | INTRAVENOUS | Status: DC
  Administered 2019-09-05 – 2019-09-06 (×2): 10 mL
  Administered 2019-09-07: 20 mL
  Administered 2019-09-07 – 2019-09-12 (×9): 10 mL

## 2019-09-05 NOTE — Progress Notes (Signed)
Physical Therapy Treatment Patient Details Name: Alyssa Odonnell MRN: [id number] DOB: [birth year] Today's Date: 09/05/2019    History of Present Illness Pt is an 85 y.o. female admitted 08/28/19 with worsening epigastric pain. Worked up for large hiatal hernia with gastric outlet obstruction. S/p laparoscopic gastropexy with placement of gastrostomy tube with extensive lysis11/13. PMH includes afib, stroke, HTN, glaucoma.    PT Comments    Patient seen for mobility progression. Pt is eager to mobilize and making progress toward PT goals. VSS on RA. Continue to progress as tolerated with anticipated d/c to SNF for further skilled PT services.     Follow Up Recommendations  SNF;Supervision for mobility/OOB     Equipment Recommendations  None recommended by PT    Recommendations for Other Services       Precautions / Restrictions Precautions Precautions: Fall Restrictions Weight Bearing Restrictions: No    Mobility  Bed Mobility Overal bed mobility: Needs Assistance Bed Mobility: Supine to Sit     Supine to sit: Mod assist;HOB elevated     General bed mobility comments: ModA to assist hips to EOB and UE support for trunk elevation, increased time and effort  Transfers Overall transfer level: Needs assistance Equipment used: Rolling walker (2 wheeled) Transfers: Sit to/from Stand Sit to Stand: Min assist         General transfer comment: assist to steady; increased time needed due to soreness/painful knees  Ambulation/Gait Ambulation/Gait assistance: Min guard;Min assist Gait Distance (Feet): (50 ft X 2 ) Assistive device: Rolling walker (2 wheeled) Gait Pattern/deviations: Trunk flexed;Step-through pattern;Decreased stride length Gait velocity: Decreased   General Gait Details: mulitmodal cues for upright posture and vc for forward gaze   Stairs             Wheelchair Mobility    Modified Rankin (Stroke Patients Only)       Balance Overall  balance assessment: Needs assistance   Sitting balance-Leahy Scale: Fair       Standing balance-Leahy Scale: Poor Standing balance comment: Reliant on UE support                            Cognition Arousal/Alertness: Awake/alert Behavior During Therapy: WFL for tasks assessed/performed Overall Cognitive Status: No family/caregiver present to determine baseline cognitive functioning                                 General Comments: WFL for simple tasks, demonstrates some short-term memory deficits and poor attention, likely baseline cognition      Exercises      General Comments General comments (skin integrity, edema, etc.): VSS on RA       Pertinent Vitals/Pain Pain Assessment: Faces Faces Pain Scale: Hurts little more Pain Location: Abdomen with bed mobility and knees when standing Pain Descriptors / Indicators: Sore;Grimacing;Guarding(audible crepitus in knees when standing) Pain Intervention(s): Limited activity within patient's tolerance;Monitored during session;Repositioned    Home Living                      Prior Function            PT Goals (current goals can now be found in the care plan section) Acute Rehab PT Goals Patient Stated Goal: Agreeable to rehab before return home since daughter cannot assist Progress towards PT goals: Progressing toward goals    Frequency    Min  2X/week      PT Plan Current plan remains appropriate    Co-evaluation              AM-PAC PT "6 Clicks" Mobility   Outcome Measure  Help needed turning from your back to your side while in a flat bed without using bedrails?: A Little Help needed moving from lying on your back to sitting on the side of a flat bed without using bedrails?: A Little Help needed moving to and from a bed to a chair (including a wheelchair)?: A Little Help needed standing up from a chair using your arms (e.g., wheelchair or bedside chair)?: A Little Help  needed to walk in hospital room?: A Little Help needed climbing 3-5 steps with a railing? : A Lot 6 Click Score: 17    End of Session   Activity Tolerance: Patient tolerated treatment well Patient left: with call bell/phone within reach;in bed;with nursing/sitter in room Nurse Communication: Mobility status PT Visit Diagnosis: Other abnormalities of gait and mobility (R26.89);Pain     Time: 1348-1430 PT Time Calculation (min) (ACUTE ONLY): 42 min  Charges:  $Gait Training: 23-37 mins                     Earney Navy, PTA Acute Rehabilitation Services Pager: [phone] Office: [phone]     Darliss Cheney 09/05/2019, 3:11 PM

## 2019-09-05 NOTE — Progress Notes (Addendum)
Patient's daughter, Lajean Silvius called regarding placement after hospital stay. Patients PCP and nurse recommended 3 facilities:[facility] and [facility]. She said anywhere in [place] or [place] would be alright. Will attempt to call social worker.

## 2019-09-05 NOTE — Progress Notes (Addendum)
Progress Note  Patient Name: Alyssa Odonnell Date of Encounter: 09/05/2019  Primary Cardiologist: Cristopher Peru, MD   Subjective   Post op day 4. Patient feels "sore all over" from being in the bed. No CP or SOB. Had 1 event of afib overnight.   Inpatient Medications    Scheduled Meds: . chlorhexidine  15 mL Mouth Rinse BID  . Chlorhexidine Gluconate Cloth  6 each Topical Daily  . latanoprost  1 drop Both Eyes QHS  . levothyroxine  50 mcg Intravenous Daily  . mouth rinse  15 mL Mouth Rinse q12n4p  . pantoprazole (PROTONIX) IV  40 mg Intravenous Q24H   Continuous Infusions: . amiodarone 30 mg/hr (09/04/19 2353)  . dextrose 5 % and 0.45% NaCl 75 mL/hr at 09/04/19 2212  . heparin 1,500 Units/hr (09/05/19 0135)  . methocarbamol (ROBAXIN) IV     PRN Meds: acetaminophen, hydrALAZINE, methocarbamol (ROBAXIN) IV, morphine injection, [DISCONTINUED] ondansetron **OR** ondansetron (ZOFRAN) IV, promethazine, traMADol, white petrolatum   Vital Signs    Vitals:   09/04/19 1950 09/04/19 2100 09/04/19 2103 09/05/19 0500  BP: 125/72   109/84  Pulse: 93  95 78  Resp: (!) 30  17 (!) 21  Temp:  99 F (37.2 C)  98.8 F (37.1 C)  TempSrc:  Oral  Axillary  SpO2: 98%  96% 96%  Weight:    77.2 kg  Height:        Intake/Output Summary (Last 24 hours) at 09/05/2019 [id number] Last data filed at 09/05/2019 0500 Gross per 24 hour  Intake 3003.15 ml  Output 2325 ml  Net 678.15 ml   Last 3 Weights 09/05/2019 09/04/2019 09/03/2019  Weight (lbs) 170 lb 3.1 oz 172 lb 9.9 oz 172 lb 13.5 oz  Weight (kg) 77.2 kg 78.3 kg 78.4 kg      Telemetry    NSR, HR 90s; 1 event of agib last night, HR 100s. - Personally Reviewed  ECG    Yesterdays EKG with NSR, 92 BPM, poor r wave progression - Personally Reviewed  Physical Exam   GEN: No acute distress.   Neck: No JVD Cardiac: RRR, systolic murmurs, rubs, or gallops.  Respiratory: Clear to auscultation bilaterally. GI: Soft, nontender,  non-distended  MS: Mild to moderate edema; No deformity. Neuro:  Nonfocal  Psych: Normal affect   Labs    High Sensitivity Troponin:   Recent Labs  Lab 08/29/19 0004 08/29/19 0409  TROPONINIHS 6 8      Chemistry Recent Labs  Lab 09/03/19 0308 09/04/19 0502 09/05/19 0332  NA 139 136 139  K 3.3* 3.8 3.3*  CL 107 103 106  CO2 22 22 23   GLUCOSE 98 104* 110*  BUN 21 20 13   CREATININE 1.27* 1.24* 1.13*  CALCIUM 8.4* 8.4* 8.3*  GFRNONAA 39* 40* 45*  GFRAA 45* 47* 52*  ANIONGAP 10 11 10      Hematology Recent Labs  Lab 09/03/19 0308 09/04/19 0502 09/05/19 0332  WBC 9.4 9.0 8.4  RBC 3.46* 3.47* 3.34*  HGB 10.5* 10.4* 9.9*  HCT 32.6* 32.5* 31.0*  MCV 94.2 93.7 92.8  MCH 30.3 30.0 29.6  MCHC 32.2 32.0 31.9  RDW 13.3 13.7 13.7  PLT 176 188 203    BNPNo results for input(s): BNP, PROBNP in the last 168 hours.   DDimer No results for input(s): DDIMER in the last 168 hours.   Radiology    No results found.  Cardiac Studies   Echo 2018 Left ventricle: The cavity size  was normal. Systolic function was normal. The estimated ejection fraction was in the range of 60% to 65%. Wall motion was normal; there were no regional wall motion abnormalities. Left ventricular diastolic function parameters were normal. - Aortic valve: There was very mild stenosis. There was no regurgitation. Peak velocity (S): 211 cm/s. Mean gradient (S): 10 mm Hg. Valve area (VTI): 1.49 cm^2. Valve area (Vmax): 1.36 cm^2. Valve area (Vmean): 1.34 cm^2. - Mitral valve: Transvalvular velocity was within the normal range. There was no evidence for stenosis. There was no regurgitation. - Right ventricle: The cavity size was normal. Wall thickness was normal. Systolic function was normal. - Atrial septum: No defect or patent foramen ovale was identified by color flow Doppler. - Tricuspid valve: There was mild regurgitation. - Pulmonary arteries: Systolic pressure was  within the normal range. PA peak pressure: 23 mm Hg (S).  Patient Profile     85 y.o. female with a hx of HTN, PAF on Eliquis, CVA, HLD, CKD III, mild AS, hypothyroidism who was seen by cardiology for surgical clearance for surgery for hiatal hernia.  Assessment & Plan    Paroxysmal atrial fibrillation with RVR Patient presented with abdominal pain, found to have a large hiatal hernia with gastric outlet obstruction. Surgery was held for Afib RVR, rates 100-110. Patient is now post-op day 4. She was initially started on a diltiazem gtt and transitioned to amiodarone gtt due to hypotension. Patient has been in and out of afib. - Patient had 1 event of afib overnight. Otherwise in NSR - EKG from yesterday showed NSR, 92 bpm - Continue amiodarone gtt.>> consolidate once patient is PO - Continue heparin gtt >> transition to home dose Eliquis once cleared by surgery  Hiatal Hernia post-op Day 4 - per surgery - Clear liquid diet - Appears to be slightly volume up   HTN - BP stable. Home benicar on hold - Continue to monitor closely    Aortic Stenosis - mild on echo 2018.  - No complaints of chest pain or syncope to suggest progression of disease - Will continue to monitor routinely outpatient   For questions or updates, please contact [place] Please consult [email or web address] for contact info under        Signed, Cadence Ninfa Meeker, PA-C  09/05/2019, 6:32 AM    Personally seen and examined. Agree with above.   Still having some trouble swallowing.  She is on broth.  Not ready to take p.o. amiodarone just yet.  GEN: Well nourished, well developed, in no acute distress  HEENT: normal  Neck: no JVD, carotid bruits, or masses Cardiac: RRR; no murmurs, rubs, or gallops,no edema  Respiratory:  clear to auscultation bilaterally, normal work of breathing GI: soft, nontender, nondistended, + BS, postop MS: no deformity or atrophy  Skin: warm and dry, no rash Neuro:  Alert  and Oriented x 3, Strength and sensation are intact Psych: euthymic mood, full affect  Assessment and plan:  Paroxysmal atrial fibrillation -Had another episode last night personally reviewed on telemetry.  Heart rate did increase but it was not excessively high.  Continue with IV amiodarone for now.  Awaiting p.o. conversion later when she is able to take p.o. more successfully. -Continue with heparin for now as well until she can take p.o.'s more successfully.  Aortic stenosis -Mild.  Candee Furbish, MD

## 2019-09-05 NOTE — Progress Notes (Signed)
ANTICOAGULATION CONSULT NOTE - Follow Up Consult  Pharmacy Consult for heparin Indication: atrial fibrillation  Allergies  Allergen Reactions  . Aspirin Other (See Comments)    Red splotches  . Atorvastatin Other (See Comments)    Muscle spams  . Nabumetone Itching  . Nsaids     Reaction (??)  . Pravastatin Other (See Comments)    Stiff walking difficulty Stiff walking difficulty  . Procaine Swelling  . Rosuvastatin Nausea Only and Other (See Comments)    Reflux and Muscle stiffness  . Simvastatin Nausea Only and Other (See Comments)    Reflux and Muscle stiffness  . Statins Other (See Comments)  . Evolocumab Rash  . Mobic [Meloxicam] Rash  . Penicillins Rash    Has patient had a PCN reaction causing immediate rash, facial/tongue/throat swelling, SOB or lightheadedness with hypotension: Yes Has patient had a PCN reaction causing severe rash involving mucus membranes or skin necrosis: No Has patient had a PCN reaction that required hospitalization: No Has patient had a PCN reaction occurring within the last 10 years: No If all of the above answers are "NO", then may proceed with Cephalosporin use.      Patient Measurements: Height: 5\' 3"  (160 cm) Weight: 170 lb 3.1 oz (77.2 kg) IBW/kg (Calculated) : 52.4 Heparin Dosing Weight: 69 kg   Vital Signs: Temp: 97.9 F (36.6 C) (11/17 0849) Temp Source: Oral (11/17 0849) BP: 136/81 (11/17 0849) Pulse Rate: 85 (11/17 0849)  Labs: Recent Labs    09/02/19 1621  09/03/19 0308 09/03/19 1146  09/04/19 0502  09/04/19 1443 09/05/19 0017 09/05/19 0332 09/05/19 0854  HGB  --    < > 10.5*  --   --  10.4*  --   --   --  9.9*  --   HCT  --   --  32.6*  --   --  32.5*  --   --   --  31.0*  --   PLT  --   --  176  --   --  188  --   --   --  203  --   APTT 43*  --  48* 52*  --   --   --   --   --   --   --   HEPARINUNFRC 0.28*  --  0.24* 0.25*   < >  --    < > 0.59 0.59  --  0.43  CREATININE  --   --  1.27*  --   --  1.24*  --    --   --  1.13*  --    < > = values in this interval not displayed.    Estimated Creatinine Clearance: 37.1 mL/min (A) (by C-G formula based on SCr of 1.13 mg/dL (H)).   Assessment: 85 yr old female with AFib on Eliquis PTA and was transitioned to IV heparin on admit. Heparin was turned off for gastrostomy tube placement and LOA on 11/13 then resumed.   Heparin today is therapeutic at 0.43, CBC stable.  Goal of Therapy:  Heparin level 0.3-0.5 units/ml Monitor platelets by anticoagulation protocol: Yes   Plan:  -Continue heparin 1500 units/hr -Daily heparin level and CBC   Arrie Senate, PharmD, BCPS Clinical Pharmacist [phone] Please check AMION for all [place] numbers 09/05/2019

## 2019-09-05 NOTE — Progress Notes (Signed)
ANTICOAGULATION CONSULT NOTE - Follow Up Consult  Pharmacy Consult for heparin Indication: atrial fibrillation  Labs: Recent Labs    09/02/19 0400  09/02/19 1621 09/03/19 0308 09/03/19 1146  09/04/19 0502 09/04/19 0703 09/04/19 1443 09/05/19 0017  HGB 10.2*  --   --  10.5*  --   --  10.4*  --   --   --   HCT 32.1*  --   --  32.6*  --   --  32.5*  --   --   --   PLT 127*  --   --  176  --   --  188  --   --   --   APTT  --   --  43* 48* 52*  --   --   --   --   --   HEPARINUNFRC  --    < > 0.28* 0.24* 0.25*   < >  --  0.45 0.59 0.59  CREATININE 1.20*  --   --  1.27*  --   --  1.24*  --   --   --    < > = values in this interval not displayed.    Assessment: 85yo female remains supratherapeutic on heparin with no change in heparin level after rate change; no gtt issues or signs of bleeding per RN.  Goal of Therapy:  Heparin level 0.3-0.5 units/ml   Plan:  Will decrease heparin gtt by 2-3 units/kg/hr to 1500 units/hr and check level in 8 hours.    Wynona Neat, PharmD, BCPS  09/05/2019,1:30 AM

## 2019-09-05 NOTE — Progress Notes (Signed)
PROGRESS NOTE  Alyssa Odonnell [id number] DOB: 24-Oct-1933 DOA: 08/28/2019 PCP: Drake Leach, MD  HPI/Recap of past 24 hours: HPI from [name] 85 year old female, lives with her daughter, ambulates with the help of a walker, PMH of PAF on Eliquis, embolic CVA, HTN, HLD, hypothyroid, PAD, cholecystectomy, presented to [facility] ED on 08/28/2019 due to abrupt onset of severe nausea, dry heaving without vomiting, chest/epigastric abdominal pain.  CT abdomen showed large hiatal hernia with gastric volvulus with gastric outlet obstruction.  Multiple unsuccessful attempts to pass NG tube at bedside.  IR consulted for NG tube under fluoroscopy. General surgery consulted and have tentatively plan for OR on 11/12, if no improvement with medical management.    Today, patient reports feeling better, was able to tolerate clear liquids yesterday, denies any nausea/vomiting, worsening abdominal pain, chest pain, shortness of breath.  Had a bowel movement.   Assessment/Plan: Principal Problem:   Volvulus of stomach Active Problems:   PAD (peripheral artery disease) (HCC)   Essential hypertension   PAF (paroxysmal atrial fibrillation) (HCC)   Gastric outlet obstruction  Large hiatal hernia with gastric outlet obstruction s/p laparoscopic gastropexy with placement of gastrostomy tube with extensive lysis of adhesions Afebrile, with resolved leukocytosis CT abdomen/pelvis on 11/10 showed above Advance to full liquid diet, pain management General surgery on board, defer pain and DVT management  Hypokalemia Replace as needed Daily BMP  Paroxysmal A. fib with RVR HR with better control EKG showed A. fib TTE on 02/2017 showed LVEF of 60 to 65% Patient was initially started on diltiazem drip, then switched to amiodarone drip due to hypotension Cardiology on board, continue amiodarone drip, IV Heparin Hold home Eliquis pending surgery recommendation Cardiology consulted, appreciate recs   Hypertension BP stable Hold home Benicar, hydrochlorothiazide Monitor closely  Hypothyroidism Continue IV Synthroid  CKD stage IIIb Creatinine at baseline Daily BMP  Anemia of chronic kidney disease Worsened by recent surgery Baseline hemoglobin between 11-12 No overt signs of bleeding Daily CBC      Malnutrition Type:      Malnutrition Characteristics:      Nutrition Interventions:       Estimated body mass index is 30.15 kg/m as calculated from the following:   Height as of this encounter: 5\' 3"  (1.6 m).   Weight as of this encounter: 77.2 kg.     Code Status: Full  Family Communication: None at bedside  Disposition Plan: To be determined   Consultants:  General surgery  Cardiology  Procedures:  None  Antimicrobials:  None  DVT prophylaxis: IV Heparin   Objective: Vitals:   09/05/19 0751 09/05/19 0849 09/05/19 1114 09/05/19 1115  BP: 130/73 136/81 140/70   Pulse: 80 85 93   Resp: (!) 22 18    Temp: 97.6 F (36.4 C) 97.9 F (36.6 C)  97.7 F (36.5 C)  TempSrc: Oral Oral  Oral  SpO2: 96% 97%    Weight:      Height:        Intake/Output Summary (Last 24 hours) at 09/05/2019 1506 Last data filed at 09/05/2019 1107 Gross per 24 hour  Intake 1279.49 ml  Output 2825 ml  Net -1545.51 ml   Filed Weights   09/03/19 0624 09/04/19 0451 09/05/19 0500  Weight: 78.4 kg 78.3 kg 77.2 kg    Exam:  General: NAD, chronically ill-appearing  Cardiovascular: S1, S2 present  Respiratory: CTAB  Abdomen: Soft, nontender, nondistended, bowel sounds present, G-tube noted in place, laparoscopic surgical sites C/D/I  Musculoskeletal: No bilateral pedal edema noted  Skin: Normal  Psychiatry: Normal mood   Data Reviewed: CBC: Recent Labs  Lab 09/01/19 0359 09/02/19 0400 09/03/19 0308 09/04/19 0502 09/05/19 0332  WBC 12.3* 9.4 9.4 9.0 8.4  NEUTROABS  --  8.1* 6.5 5.9 5.1  HGB 12.3 10.2* 10.5* 10.4* 9.9*  HCT 37.5 32.1*  32.6* 32.5* 31.0*  MCV 93.3 93.9 94.2 93.7 92.8  PLT 129* 127* 176 188 [id number]   Basic Metabolic Panel: Recent Labs  Lab 09/01/19 0359 09/02/19 0400 09/03/19 0308 09/04/19 0502 09/05/19 0332  NA 135 135 139 136 139  K 3.2* 4.0 3.3* 3.8 3.3*  CL 103 103 107 103 106  CO2 22 21* 22 22 23   GLUCOSE 117* 140* 98 104* 110*  BUN 11 19 21 20 13   CREATININE 1.34* 1.20* 1.27* 1.24* 1.13*  CALCIUM 8.6* 8.2* 8.4* 8.4* 8.3*   GFR: Estimated Creatinine Clearance: 37.1 mL/min (A) (by C-G formula based on SCr of 1.13 mg/dL (H)). Liver Function Tests: No results for input(s): AST, ALT, ALKPHOS, BILITOT, PROT, ALBUMIN in the last 168 hours. No results for input(s): LIPASE, AMYLASE in the last 168 hours. No results for input(s): AMMONIA in the last 168 hours. Coagulation Profile: No results for input(s): INR, PROTIME in the last 168 hours. Cardiac Enzymes: No results for input(s): CKTOTAL, CKMB, CKMBINDEX, TROPONINI in the last 168 hours. BNP (last 3 results) No results for input(s): PROBNP in the last 8760 hours. HbA1C: No results for input(s): HGBA1C in the last 72 hours. CBG: Recent Labs  Lab 09/04/19 1217 09/04/19 1621 09/04/19 2153 09/05/19 0838 09/05/19 1218  GLUCAP 92 85 100* 98 131*   Lipid Profile: No results for input(s): CHOL, HDL, LDLCALC, TRIG, CHOLHDL, LDLDIRECT in the last 72 hours. Thyroid Function Tests: No results for input(s): TSH, T4TOTAL, FREET4, T3FREE, THYROIDAB in the last 72 hours. Anemia Panel: No results for input(s): VITAMINB12, FOLATE, FERRITIN, TIBC, IRON, RETICCTPCT in the last 72 hours. Urine analysis: No results found for: COLORURINE, APPEARANCEUR, LABSPEC, PHURINE, GLUCOSEU, HGBUR, BILIRUBINUR, KETONESUR, PROTEINUR, UROBILINOGEN, NITRITE, LEUKOCYTESUR Sepsis Labs: @LABRCNTIP (procalcitonin:4,lacticidven:4)  ) Recent Results (from the past 240 hour(s))  SARS CORONAVIRUS 2 (TAT 6-24 HRS) Nasopharyngeal Nasopharyngeal Swab     Status: None   Collection  Time: 08/29/19  5:30 AM   Specimen: Nasopharyngeal Swab  Result Value Ref Range Status   SARS Coronavirus 2 NEGATIVE NEGATIVE Final    Comment: (NOTE) SARS-CoV-2 target nucleic acids are NOT DETECTED. The SARS-CoV-2 RNA is generally detectable in upper and lower respiratory specimens during the acute phase of infection. Negative results do not preclude SARS-CoV-2 infection, do not rule out co-infections with other pathogens, and should not be used as the sole basis for treatment or other patient management decisions. Negative results must be combined with clinical observations, patient history, and epidemiological information. The expected result is Negative. Fact Sheet for Patients: SugarRoll.be Fact Sheet for Healthcare Providers: https://www.woods-mathews.com/ This test is not yet approved or cleared by the [place] FDA and  has been authorized for detection and/or diagnosis of SARS-CoV-2 by FDA under an Emergency Use Authorization (EUA). This EUA will remain  in effect (meaning this test can be used) for the duration of the COVID-19 declaration under Section 56 4(b)(1) of the Act, 21 U.S.C. section 360bbb-3(b)(1), unless the authorization is terminated or revoked sooner. Performed at [facility], [place] [address] [place], [place] [address]   Surgical pcr screen     Status: None   Collection  Time: 09/01/19  2:16 PM   Specimen: Nasal Mucosa; Nasal Swab  Result Value Ref Range Status   MRSA, PCR NEGATIVE NEGATIVE Final   Staphylococcus aureus NEGATIVE NEGATIVE Final    Comment: (NOTE) The Xpert SA Assay (FDA approved for NASAL specimens in patients 14 years of age and older), is one component of a comprehensive surveillance program. It is not intended to diagnose infection nor to guide or monitor treatment. Performed at [facility], [place] [address] [place], [place] [address]       Studies: No results found.   Scheduled Meds: . chlorhexidine  15 mL Mouth Rinse BID  . Chlorhexidine Gluconate Cloth  6 each Topical Daily  . feeding supplement (ENSURE ENLIVE)  237 mL Oral BID BM  . latanoprost  1 drop Both Eyes QHS  . levothyroxine  50 mcg Intravenous Daily  . mouth rinse  15 mL Mouth Rinse q12n4p  . pantoprazole (PROTONIX) IV  40 mg Intravenous Q24H    Continuous Infusions: . amiodarone 30 mg/hr (09/04/19 2353)  . dextrose 5 % and 0.45% NaCl 10 mL/hr at 09/05/19 1210  . heparin 1,500 Units/hr (09/05/19 0135)     LOS: 7 days     Alma Friendly, MD [facility]  If 7PM-7AM, please contact night-coverage [email or web address] 09/05/2019, 3:06 PM

## 2019-09-05 NOTE — Progress Notes (Signed)
[facility] Progress Note  4 Days Post-Op  Subjective: CC: did not get tray this AM Patient upset that no tray was brought to her this AM. She tolerated CLD well yesterday and is still having bowel function. Denies much abdominal pain, just sore from the bed and arthritis.   Objective: Vital signs in last 24 hours: Temp:  [97.6 F (36.4 C)-99 F (37.2 C)] 97.9 F (36.6 C) (11/17 0849) Pulse Rate:  [78-95] 85 (11/17 0849) Resp:  [17-30] 18 (11/17 0849) BP: (102-149)/(66-89) 136/81 (11/17 0849) SpO2:  [95 %-98 %] 97 % (11/17 0849) Weight:  [77.2 kg] 77.2 kg (11/17 0500) Last BM Date: 09/04/19  Intake/Output from previous day: 11/16 0701 - 11/17 0700 In: 1759.5 [P.O.:480; I.V.:1279.5] Out: 2325 [Urine:2325] Intake/Output this shift: No intake/output data recorded.  PE: Gen:  Alert, NAD, pleasant Pulm:  Normal effort Abd: Soft, non-tender, non-distended, +BS, incisions c/d/i, G-tube clamped Skin: warm and dry, no rashes  Psych: A&Ox3   Lab Results:  Recent Labs    09/04/19 0502 09/05/19 0332  WBC 9.0 8.4  HGB 10.4* 9.9*  HCT 32.5* 31.0*  PLT 188 203   BMET Recent Labs    09/04/19 0502 09/05/19 0332  NA 136 139  K 3.8 3.3*  CL 103 106  CO2 22 23  GLUCOSE 104* 110*  BUN 20 13  CREATININE 1.24* 1.13*  CALCIUM 8.4* 8.3*   PT/INR No results for input(s): LABPROT, INR in the last 72 hours. CMP     Component Value Date/Time   NA 139 09/05/2019 0332   NA 141 03/03/2018 1352   K 3.3 (L) 09/05/2019 0332   CL 106 09/05/2019 0332   CO2 23 09/05/2019 0332   GLUCOSE 110 (H) 09/05/2019 0332   BUN 13 09/05/2019 0332   BUN 31 (H) 03/03/2018 1352   CREATININE 1.13 (H) 09/05/2019 0332   CALCIUM 8.3 (L) 09/05/2019 0332   PROT 7.0 08/29/2019 0004   ALBUMIN 3.9 08/29/2019 0004   AST 16 08/29/2019 0004   ALT 14 08/29/2019 0004   ALKPHOS 81 08/29/2019 0004   BILITOT 0.6 08/29/2019 0004   GFRNONAA 45 (L) 09/05/2019 0332   GFRAA 52 (L) 09/05/2019 0332    Lipase     Component Value Date/Time   LIPASE 30 08/29/2019 0004       Studies/Results: No results found.  Anti-infectives: Anti-infectives (From admission, onward)   Start     Dose/Rate Route Frequency Ordered Stop   09/01/19 1400  ciprofloxacin (CIPRO) IVPB 400 mg     400 mg 200 mL/hr over 60 Minutes Intravenous On call to O.R. 09/01/19 [id number] 09/01/19 1615       Assessment/Plan Hx CVA Hypothyroidism HTN HLD Paroxysmal A Fib on Elquis -eliquis on hold, on IV heparin Afib with RVR - on cardizem drip. Cardiology on board  Hiatal Hernia with Gastric Outlet Obstruction S/p laparoscopic gastropexy with g-tube, LOA 11/13 Dr. Brantley Stage - POD#4 - advance to FLD - flush G-tube daily, tolerating having it clamped  - mobilize!  FEN -FLD VTE -SCDs, Heparingtt ID -cipro pre-op  LOS: 7 days    Alyssa Odonnell , [facility] 09/05/2019, 10:24 AM Please see Amion for pager number during day hours 7:00am-4:30pm

## 2019-09-06 LAB — CBC WITH DIFFERENTIAL/PLATELET
Abs Immature Granulocytes: 0.15 10*3/uL — ABNORMAL HIGH (ref 0.00–0.07)
Basophils Absolute: 0 10*3/uL (ref 0.0–0.1)
Basophils Relative: 1 %
Eosinophils Absolute: 0.3 10*3/uL (ref 0.0–0.5)
Eosinophils Relative: 3 %
HCT: 29.4 % — ABNORMAL LOW (ref 36.0–46.0)
Hemoglobin: 9.2 g/dL — ABNORMAL LOW (ref 12.0–15.0)
Immature Granulocytes: 2 %
Lymphocytes Relative: 27 %
Lymphs Abs: 2.4 10*3/uL (ref 0.7–4.0)
MCH: 29.7 pg (ref 26.0–34.0)
MCHC: 31.3 g/dL (ref 30.0–36.0)
MCV: 94.8 fL (ref 80.0–100.0)
Monocytes Absolute: 1.2 10*3/uL — ABNORMAL HIGH (ref 0.1–1.0)
Monocytes Relative: 13 %
Neutro Abs: 4.8 10*3/uL (ref 1.7–7.7)
Neutrophils Relative %: 54 %
Platelets: 219 10*3/uL (ref 150–400)
RBC: 3.1 MIL/uL — ABNORMAL LOW (ref 3.87–5.11)
RDW: 13.9 % (ref 11.5–15.5)
WBC: 8.8 10*3/uL (ref 4.0–10.5)
nRBC: 0 % (ref 0.0–0.2)

## 2019-09-06 LAB — BASIC METABOLIC PANEL
Anion gap: 10 (ref 5–15)
BUN: 11 mg/dL (ref 8–23)
CO2: 25 mmol/L (ref 22–32)
Calcium: 8.2 mg/dL — ABNORMAL LOW (ref 8.9–10.3)
Chloride: 104 mmol/L (ref 98–111)
Creatinine, Ser: 1.16 mg/dL — ABNORMAL HIGH (ref 0.44–1.00)
GFR calc Af Amer: 50 mL/min — ABNORMAL LOW (ref >60)
GFR calc non Af Amer: 44 mL/min — ABNORMAL LOW (ref >60)
Glucose, Bld: 105 mg/dL — ABNORMAL HIGH (ref 70–99)
Potassium: 2.9 mmol/L — ABNORMAL LOW (ref 3.5–5.1)
Sodium: 139 mmol/L (ref 135–145)

## 2019-09-06 LAB — GLUCOSE, CAPILLARY
Glucose-Capillary: 106 mg/dL — ABNORMAL HIGH (ref 70–99)
Glucose-Capillary: 132 mg/dL — ABNORMAL HIGH (ref 70–99)
Glucose-Capillary: 97 mg/dL (ref 70–99)
Glucose-Capillary: 98 mg/dL (ref 70–99)

## 2019-09-06 LAB — HEPARIN LEVEL (UNFRACTIONATED): Heparin Unfractionated: 0.33 IU/mL (ref 0.30–0.70)

## 2019-09-06 LAB — SARS CORONAVIRUS 2 (TAT 6-24 HRS): SARS Coronavirus 2: NEGATIVE

## 2019-09-06 MED ORDER — POLYETHYLENE GLYCOL 3350 17 G PO PACK
17.0000 g | PACK | Freq: Every day | ORAL | Status: DC
  Administered 2019-09-06 – 2019-09-08 (×3): 17 g via ORAL
  Filled 2019-09-06 (×3): qty 1

## 2019-09-06 MED ORDER — APIXABAN 2.5 MG PO TABS
2.5000 mg | ORAL_TABLET | Freq: Two times a day (BID) | ORAL | Status: DC
  Administered 2019-09-06: 2.5 mg via ORAL
  Filled 2019-09-06: qty 1

## 2019-09-06 MED ORDER — DOCUSATE SODIUM 100 MG PO CAPS
100.0000 mg | ORAL_CAPSULE | Freq: Every day | ORAL | Status: DC
  Administered 2019-09-06 – 2019-09-26 (×20): 100 mg via ORAL
  Filled 2019-09-06 (×21): qty 1

## 2019-09-06 MED ORDER — POTASSIUM CHLORIDE CRYS ER 20 MEQ PO TBCR
40.0000 meq | EXTENDED_RELEASE_TABLET | Freq: Once | ORAL | Status: AC
  Administered 2019-09-06: 40 meq via ORAL
  Filled 2019-09-06: qty 2

## 2019-09-06 MED ORDER — POTASSIUM CHLORIDE 10 MEQ/100ML IV SOLN
10.0000 meq | INTRAVENOUS | Status: AC
  Administered 2019-09-06 (×3): 10 meq via INTRAVENOUS
  Filled 2019-09-06 (×3): qty 100

## 2019-09-06 MED ORDER — BISACODYL 10 MG RE SUPP: 10.0000 mg | Freq: Every day | RECTAL | Status: DC | PRN

## 2019-09-06 NOTE — Progress Notes (Addendum)
PROGRESS NOTE    Alyssa Odonnell  [id number] DOB: 11-16-1933 DOA: 08/28/2019 PCP: Drake Leach, MD   Brief Narrative: 85 year old who ambulate with help of walker, past medical history PAF Eliquis, embolic CVA, hypertension, hyperlipidemia, PAD, cholecystectomy who presented to [facility] ED on 08/28/2019 due to abrupt onset of severe nausea, dry heaving without vomiting, chest epigastric abdominal pain.  CT abdomen showed large hiatal hernia with gastric volvulus with gastric outlet obstruction.  Multiple unsuccessful attempts to pass NG tube at bedside.  IR consulted for NG tube under fluoroscopy.  General surgery consulted and patient was taken to the OR on 11/13 for laparoscopic gastropexy with placement of a 20 French gastrostomy tube with extensive lysis of adhesion.    Assessment & Plan:   Principal Problem:   Volvulus of stomach Active Problems:   PAD (peripheral artery disease) (HCC)   Essential hypertension   PAF (paroxysmal atrial fibrillation) (HCC)   Gastric outlet obstruction   1-Large hiatal hernia with gastric outlet obstruction status post laparoscopic gastropexy with placement of gastrostomy tube with extensive lysis of adhesions. Patient initially presented with leukocytosis, CT abdomen pelvis showed gastric outlet obstruction. General surgery was consulted and patient underwent laparoscopic gastropexy with placement of gastrostomy tube and extensive lysis of adhesions. Plan to advance diet today to full liquid diet. Per general surgery patient will require GT for 6 weeks  2-Hypokalemia: Replace as needed  3-Paroxysmal A. Fib: On heparin drip, discussed with Dr. Ninfa Linden okay to transition to Eliquis. On IV amiodarone.  Transition to oral per cardiology.  4-Hypertension; became hypotensive with A. fib RVR on Cardizem. Hydrochlorothiazide and Benicar on hold.  5-Hypothyroidism: Continue with Synthroid  6-Chronic kidney disease a stage IIIb: Monitor  kidney function daily  Anemia of chronic disease: Worsening by recent surgery.  Monitor hemoglobin daily. Hypokalemia: Replete IV and orally    Estimated body mass index is 30.42 kg/m as calculated from the following:   Height as of this encounter: 5\' 3"  (1.6 m).   Weight as of this encounter: 77.9 kg.   DVT prophylaxis: Heparin drip transition to Eliquis today. Code Status: Full code Family Communication: Discussed with patient Disposition Plan: She will require skilled nursing facility, social worker consulted Consultants:  Cardiology Surgery  Procedures:  laparoscopic gastropexy with placement of a 29 French gastrostomy tube with extensive lysis of adhesion.11-13    Antimicrobials:    Subjective: Alert and oriented x3.  She is tolerating clear diet.  Plan to advance diet to full liquid.  She denies abdominal pain.  Objective: Vitals:   09/06/19 0043 09/06/19 0534 09/06/19 0811 09/06/19 1120  BP: 132/61 120/72 126/75 129/83  Pulse: 86 83 78 89  Resp: 19 20 19 20   Temp: 98.7 F (37.1 C) 97.9 F (36.6 C) 98.1 F (36.7 C) 97.8 F (36.6 C)  TempSrc: Oral Oral Oral Oral  SpO2: 99% 98% 99% 97%  Weight:  77.9 kg    Height:        Intake/Output Summary (Last 24 hours) at 09/06/2019 1536 Last data filed at 09/06/2019 1500 Gross per 24 hour  Intake 1511.72 ml  Output 950 ml  Net 561.72 ml   Filed Weights   09/04/19 0451 09/05/19 0500 09/06/19 0534  Weight: 78.3 kg 77.2 kg 77.9 kg    Examination:  General exam: Appears calm and comfortable  Respiratory system: Clear to auscultation. Respiratory effort normal. Cardiovascular system: S1 & S2 heard, RRR. No JVD, murmurs, rubs, gallops or clicks. No pedal edema. Gastrointestinal  system: Abdomen is nondistended, soft and nontender. No organomegaly or masses felt. Normal bowel sounds heard.  G-tube in place Central nervous system: Alert and oriented. No focal neurological deficits. Extremities: Symmetric 5 x 5  power. Skin: No rashes, lesions or ulcers Psychiatry: Judgement and insight appear normal. Mood & affect appropriate.     Data Reviewed: I have personally reviewed following labs and imaging studies  CBC: Recent Labs  Lab 09/02/19 0400 09/03/19 0308 09/04/19 0502 09/05/19 0332 09/06/19 0430  WBC 9.4 9.4 9.0 8.4 8.8  NEUTROABS 8.1* 6.5 5.9 5.1 4.8  HGB 10.2* 10.5* 10.4* 9.9* 9.2*  HCT 32.1* 32.6* 32.5* 31.0* 29.4*  MCV 93.9 94.2 93.7 92.8 94.8  PLT 127* 176 188 203 [id number]   Basic Metabolic Panel: Recent Labs  Lab 09/02/19 0400 09/03/19 0308 09/04/19 0502 09/05/19 0332 09/06/19 0430  NA 135 139 136 139 139  K 4.0 3.3* 3.8 3.3* 2.9*  CL 103 107 103 106 104  CO2 21* 22 22 23 25   GLUCOSE 140* 98 104* 110* 105*  BUN 19 21 20 13 11   CREATININE 1.20* 1.27* 1.24* 1.13* 1.16*  CALCIUM 8.2* 8.4* 8.4* 8.3* 8.2*   GFR: Estimated Creatinine Clearance: 36.3 mL/min (A) (by C-G formula based on SCr of 1.16 mg/dL (H)). Liver Function Tests: No results for input(s): AST, ALT, ALKPHOS, BILITOT, PROT, ALBUMIN in the last 168 hours. No results for input(s): LIPASE, AMYLASE in the last 168 hours. No results for input(s): AMMONIA in the last 168 hours. Coagulation Profile: No results for input(s): INR, PROTIME in the last 168 hours. Cardiac Enzymes: No results for input(s): CKTOTAL, CKMB, CKMBINDEX, TROPONINI in the last 168 hours. BNP (last 3 results) No results for input(s): PROBNP in the last 8760 hours. HbA1C: No results for input(s): HGBA1C in the last 72 hours. CBG: Recent Labs  Lab 09/05/19 0838 09/05/19 1218 09/05/19 1518 09/06/19 0039 09/06/19 0757  GLUCAP 98 131* 85 97 98   Lipid Profile: No results for input(s): CHOL, HDL, LDLCALC, TRIG, CHOLHDL, LDLDIRECT in the last 72 hours. Thyroid Function Tests: No results for input(s): TSH, T4TOTAL, FREET4, T3FREE, THYROIDAB in the last 72 hours. Anemia Panel: No results for input(s): VITAMINB12, FOLATE, FERRITIN, TIBC,  IRON, RETICCTPCT in the last 72 hours. Sepsis Labs: No results for input(s): PROCALCITON, LATICACIDVEN in the last 168 hours.  Recent Results (from the past 240 hour(s))  SARS CORONAVIRUS 2 (TAT 6-24 HRS) Nasopharyngeal Nasopharyngeal Swab     Status: None   Collection Time: 08/29/19  5:30 AM   Specimen: Nasopharyngeal Swab  Result Value Ref Range Status   SARS Coronavirus 2 NEGATIVE NEGATIVE Final    Comment: (NOTE) SARS-CoV-2 target nucleic acids are NOT DETECTED. The SARS-CoV-2 RNA is generally detectable in upper and lower respiratory specimens during the acute phase of infection. Negative results do not preclude SARS-CoV-2 infection, do not rule out co-infections with other pathogens, and should not be used as the sole basis for treatment or other patient management decisions. Negative results must be combined with clinical observations, patient history, and epidemiological information. The expected result is Negative. Fact Sheet for Patients: SugarRoll.be Fact Sheet for Healthcare Providers: https://www.woods-mathews.com/ This test is not yet approved or cleared by the [place] FDA and  has been authorized for detection and/or diagnosis of SARS-CoV-2 by FDA under an Emergency Use Authorization (EUA). This EUA will remain  in effect (meaning this test can be used) for the duration of the COVID-19 declaration under Section 56 4(b)(1) of the  Act, 21 U.S.C. section 360bbb-3(b)(1), unless the authorization is terminated or revoked sooner. Performed at [facility], [place] [address] [place], [place] [address]   Surgical pcr screen     Status: None   Collection Time: 09/01/19  2:16 PM   Specimen: Nasal Mucosa; Nasal Swab  Result Value Ref Range Status   MRSA, PCR NEGATIVE NEGATIVE Final   Staphylococcus aureus NEGATIVE NEGATIVE Final    Comment: (NOTE) The Xpert SA Assay (FDA approved for NASAL specimens in patients 24 years  of age and older), is one component of a comprehensive surveillance program. It is not intended to diagnose infection nor to guide or monitor treatment. Performed at [facility], [place] [address], [place], [place] [address]          Radiology Studies: No results found.      Scheduled Meds: . chlorhexidine  15 mL Mouth Rinse BID  . docusate sodium  100 mg Oral Daily  . feeding supplement (ENSURE ENLIVE)  237 mL Oral BID BM  . latanoprost  1 drop Both Eyes QHS  . levothyroxine  50 mcg Intravenous Daily  . mouth rinse  15 mL Mouth Rinse q12n4p  . pantoprazole (PROTONIX) IV  40 mg Intravenous Q24H  . polyethylene glycol  17 g Oral Daily  . sodium chloride flush  10-40 mL Intracatheter Q12H   Continuous Infusions: . amiodarone 30 mg/hr (09/06/19 1528)  . dextrose 5 % and 0.45% NaCl 15 mL/hr at 09/05/19 1712  . heparin 1,500 Units/hr (09/06/19 1500)     LOS: 8 days    Time spent: 35 minutes.     Elmarie Shiley, MD [facility]   If 7PM-7AM, please contact night-coverage [email or web address] Password [facility] 09/06/2019, 3:36 PM

## 2019-09-06 NOTE — Progress Notes (Addendum)
Progress Note  Patient Name: Alyssa Odonnell Date of Encounter: 09/06/2019  Primary Cardiologist: Cristopher Peru, MD   Subjective   Patient is feeling better each day. She has been ambulating without symptoms. No CP or palpitations.   Inpatient Medications    Scheduled Meds: . chlorhexidine  15 mL Mouth Rinse BID  . Chlorhexidine Gluconate Cloth  6 each Topical Daily  . feeding supplement (ENSURE ENLIVE)  237 mL Oral BID BM  . latanoprost  1 drop Both Eyes QHS  . levothyroxine  50 mcg Intravenous Daily  . mouth rinse  15 mL Mouth Rinse q12n4p  . pantoprazole (PROTONIX) IV  40 mg Intravenous Q24H  . sodium chloride flush  10-40 mL Intracatheter Q12H   Continuous Infusions: . amiodarone 30 mg/hr (09/06/19 0415)  . dextrose 5 % and 0.45% NaCl 10 mL/hr at 09/05/19 1210  . heparin 1,500 Units/hr (09/05/19 1716)   PRN Meds: acetaminophen, dextrose 5 % and 0.45% NaCl, hydrALAZINE, methocarbamol, morphine injection, [DISCONTINUED] ondansetron **OR** ondansetron (ZOFRAN) IV, promethazine, sodium chloride flush, traMADol, white petrolatum   Vital Signs    Vitals:   09/05/19 1521 09/05/19 2031 09/06/19 0043 09/06/19 0534  BP: (!) 145/79 137/85 132/61 120/72  Pulse: 86 96 86 83  Resp: 20 18 19 20   Temp: 98 F (36.7 C) 98.4 F (36.9 C) 98.7 F (37.1 C) 97.9 F (36.6 C)  TempSrc: Oral Oral Oral Oral  SpO2: 97% 96% 99% 98%  Weight:    77.9 kg  Height:        Intake/Output Summary (Last 24 hours) at 09/06/2019 0728 Last data filed at 09/06/2019 0600 Gross per 24 hour  Intake 980.67 ml  Output 1000 ml  Net -19.33 ml   Last 3 Weights 09/06/2019 09/05/2019 09/04/2019  Weight (lbs) 171 lb 11.8 oz 170 lb 3.1 oz 172 lb 9.9 oz  Weight (kg) 77.9 kg 77.2 kg 78.3 kg      Telemetry    NSR with some sinus tachycardia up to 130s, HR 80-90s - Personally Reviewed  ECG    No new - Personally Reviewed  Physical Exam   GEN: No acute distress.   Neck: No JVD Cardiac: RRR, no  murmurs, rubs, or gallops.  Respiratory: Clear to auscultation bilaterally. GI: Soft, nontender, non-distended  MS: No edema; No deformity. Neuro:  Nonfocal  Psych: Normal affect   Labs    High Sensitivity Troponin:   Recent Labs  Lab 08/29/19 0004 08/29/19 0409  TROPONINIHS 6 8      Chemistry Recent Labs  Lab 09/04/19 0502 09/05/19 0332 09/06/19 0430  NA 136 139 139  K 3.8 3.3* 2.9*  CL 103 106 104  CO2 22 23 25   GLUCOSE 104* 110* 105*  BUN 20 13 11   CREATININE 1.24* 1.13* 1.16*  CALCIUM 8.4* 8.3* 8.2*  GFRNONAA 40* 45* 44*  GFRAA 47* 52* 50*  ANIONGAP 11 10 10      Hematology Recent Labs  Lab 09/04/19 0502 09/05/19 0332 09/06/19 0430  WBC 9.0 8.4 8.8  RBC 3.47* 3.34* 3.10*  HGB 10.4* 9.9* 9.2*  HCT 32.5* 31.0* 29.4*  MCV 93.7 92.8 94.8  MCH 30.0 29.6 29.7  MCHC 32.0 31.9 31.3  RDW 13.7 13.7 13.9  PLT 188 203 219    BNPNo results for input(s): BNP, PROBNP in the last 168 hours.   DDimer No results for input(s): DDIMER in the last 168 hours.   Radiology    No results found.  Cardiac Studies  Echo 2018 Left ventricle: The cavity size was normal. Systolic function was normal. The estimated ejection fraction was in the range of 60% to 65%. Wall motion was normal; there were no regional wall motion abnormalities. Left ventricular diastolic function parameters were normal. - Aortic valve: There was very mild stenosis. There was no regurgitation. Peak velocity (S): 211 cm/s. Mean gradient (S): 10 mm Hg. Valve area (VTI): 1.49 cm^2. Valve area (Vmax): 1.36 cm^2. Valve area (Vmean): 1.34 cm^2. - Mitral valve: Transvalvular velocity was within the normal range. There was no evidence for stenosis. There was no regurgitation. - Right ventricle: The cavity size was normal. Wall thickness was normal. Systolic function was normal. - Atrial septum: No defect or patent foramen ovale was identified by color flow Doppler. -  Tricuspid valve: There was mild regurgitation. - Pulmonary arteries: Systolic pressure was within the normal range. PA peak pressure: 23 mm Hg (S).  Patient Profile     85 y.o. female with a hx of HTN, PAFon Eliquis, CVA, HLD, CKD III, mild AS, hypothyroidism who was seen by cardiology for surgical clearance for surgery for hiatal hernia.  Assessment & Plan    Paroxysmal atrial fibrillation with RVR Patient presented with abdominal pain, found to have a large hiatal hernia with gastric outlet obstruction.Surgery was held for Afib RVR, rates 100-110. Patient is now post-op day 5. She was initially started on a diltiazem gtt and transitioned to amiodarone gtt due to hypotension. Patient has been in and out of afib. - Patient has been in NSR - She is feeling better each day. She has been ambulating in the hallways without symptoms.  - Continue amiodarone gtt.>> consolidate once patient is PO - Continue heparin gtt>> transition to home dose Eliquis once cleared by surgery  Hiatal Hernia post-op Day 5 - per surgery - Full liquid diet   HTN -BPstable. Home benicar on hold   Aortic Stenosis -mild on echo 2018. -No complaints of chest pain or syncope to suggest progression of disease - Will continue to monitor routinely outpatient   For questions or updates, please contact [place] Please consult [email or web address] for contact info under        Signed, Cadence Ninfa Meeker, PA-C  09/06/2019, 7:28 AM    Personally seen and examined. Agree with above.  Talkative, used to work in cardiology many years ago  Regular rate and rhythm  Surgery note reviewed.  Mobilize.  G-tube flushes.  Paroxysmal atrial fibrillation -On IV amiodarone and heparin -Change to oral amiodarone 200 mg once a day when able -Change to Eliquis 5 mg twice a day when able to take p.o.'s effectively.  Candee Furbish, MD

## 2019-09-06 NOTE — TOC Progression Note (Addendum)
Transition of Care [facility]) - Progression Note    Patient Details  Name: CHANTIL STABLES MRN: [id number] Date of Birth: December 03, 1933  [facility]) CM/SW Contact  Eileen Stanford, LCSW Phone Number: 09/06/2019, 3:43 PM  Clinical Narrative:   Pt has chosen [name]. Insurance [name] was started. MD is aware to order COVID.    Expected Discharge Plan: [place] Barriers to Discharge: Continued Medical Work up  Expected Discharge Plan and Services Expected Discharge Plan: [place] In-house Referral: NA   Post Acute Care Choice: [place] Living arrangements for the past 2 months: Single Family Home                                       Social Determinants of Health (SDOH) Interventions    Readmission Risk Interventions No flowsheet data found.

## 2019-09-06 NOTE — Progress Notes (Signed)
[facility]/Trauma Progress Note  5 Days Post-Op   Assessment/Plan Hx CVA Hypothyroidism HTN HLD Paroxysmal A Fib on Elquis -eliquis on hold, on IV heparin Afib with RVR - on cardizem drip. Cardiology on board  Hiatal Hernia with Gastric Outlet Obstruction S/p laparoscopic gastropexy with g-tube, LOA 11/13 Dr. Brantley Stage - POD#5 - advance to soft diet - flush G-tube daily, tolerating having it clamped  - mobilize!  FEN -FLD VTE -SCDs, Heparingtt ID -cipro pre-op Follow up: Dr. Brantley Stage  Plan: home soon from our standpoint if she tolerates a diet. G tube will need to stay in for at least 6 weeks. She will need to flush at home.    LOS: 8 days    Subjective: CC: abdominal soreness  No issues overnight. Pt is having flatus and BM's. No nausea or vomiting. Tolerating diet.   Objective: Vital signs in last 24 hours: Temp:  [97.7 F (36.5 C)-98.7 F (37.1 C)] 98.1 F (36.7 C) (11/18 0811) Pulse Rate:  [78-96] 78 (11/18 0811) Resp:  [18-20] 19 (11/18 0811) BP: (120-145)/(61-85) 126/75 (11/18 0811) SpO2:  [96 %-99 %] 99 % (11/18 0811) Weight:  [77.9 kg] 77.9 kg (11/18 0534) Last BM Date: 09/05/19  Intake/Output from previous day: 11/17 0701 - 11/18 0700 In: 980.7 [P.O.:120; I.V.:860.7] Out: 1000 [Urine:1000] Intake/Output this shift: No intake/output data recorded.  PE:  Gen:  Alert, NAD, pleasant, cooperative Pulm:  Rate and effort normal Abd: Soft, ND, +BS, incisions with glue intact are well appearing, scant purulent drainage around G tube but no erythema or induration to suggest cellulitis, mild TTP of upper abdomen without guarding, no peritontis, G tube clamped.  Skin: no rashes noted, warm and dry   Anti-infectives: Anti-infectives (From admission, onward)   Start     Dose/Rate Route Frequency Ordered Stop   09/01/19 1400  ciprofloxacin (CIPRO) IVPB 400 mg     400 mg 200 mL/hr over 60 Minutes Intravenous On call to O.R. 09/01/19 0738  09/01/19 1615      Lab Results:  Recent Labs    09/05/19 0332 09/06/19 0430  WBC 8.4 8.8  HGB 9.9* 9.2*  HCT 31.0* 29.4*  PLT 203 219   BMET Recent Labs    09/05/19 0332 09/06/19 0430  NA 139 139  K 3.3* 2.9*  CL 106 104  CO2 23 25  GLUCOSE 110* 105*  BUN 13 11  CREATININE 1.13* 1.16*  CALCIUM 8.3* 8.2*   PT/INR No results for input(s): LABPROT, INR in the last 72 hours. CMP     Component Value Date/Time   NA 139 09/06/2019 0430   NA 141 03/03/2018 1352   K 2.9 (L) 09/06/2019 0430   CL 104 09/06/2019 0430   CO2 25 09/06/2019 0430   GLUCOSE 105 (H) 09/06/2019 0430   BUN 11 09/06/2019 0430   BUN 31 (H) 03/03/2018 1352   CREATININE 1.16 (H) 09/06/2019 0430   CALCIUM 8.2 (L) 09/06/2019 0430   PROT 7.0 08/29/2019 0004   ALBUMIN 3.9 08/29/2019 0004   AST 16 08/29/2019 0004   ALT 14 08/29/2019 0004   ALKPHOS 81 08/29/2019 0004   BILITOT 0.6 08/29/2019 0004   GFRNONAA 44 (L) 09/06/2019 0430   GFRAA 50 (L) 09/06/2019 0430   Lipase     Component Value Date/Time   LIPASE 30 08/29/2019 0004    Studies/Results: No results found.   Kalman Drape, PA-C [facility] Surgery Please see amion for pager for the following: M, T, W, &  Friday 7:00am - 4:30pm Thursdays 7:00am -11:30am

## 2019-09-06 NOTE — Progress Notes (Addendum)
ANTICOAGULATION CONSULT NOTE - Follow Up Consult  Pharmacy Consult for apixaban Indication: atrial fibrillation  Allergies  Allergen Reactions  . Aspirin Other (See Comments)    Red splotches  . Atorvastatin Other (See Comments)    Muscle spams  . Nabumetone Itching  . Nsaids     Reaction (??)  . Pravastatin Other (See Comments)    Stiff walking difficulty Stiff walking difficulty  . Procaine Swelling  . Rosuvastatin Nausea Only and Other (See Comments)    Reflux and Muscle stiffness  . Simvastatin Nausea Only and Other (See Comments)    Reflux and Muscle stiffness  . Statins Other (See Comments)  . Evolocumab Rash  . Mobic [Meloxicam] Rash  . Penicillins Rash    Has patient had a PCN reaction causing immediate rash, facial/tongue/throat swelling, SOB or lightheadedness with hypotension: Yes Has patient had a PCN reaction causing severe rash involving mucus membranes or skin necrosis: No Has patient had a PCN reaction that required hospitalization: No Has patient had a PCN reaction occurring within the last 10 years: No If all of the above answers are "NO", then may proceed with Cephalosporin use.      Patient Measurements: Height: 5\' 3"  (160 cm) Weight: 171 lb 11.8 oz (77.9 kg) IBW/kg (Calculated) : 52.4 Heparin Dosing Weight: 69 kg   Vital Signs: Temp: 97.8 F (36.6 C) (11/18 1120) Temp Source: Oral (11/18 1120) BP: 129/83 (11/18 1120) Pulse Rate: 89 (11/18 1120)  Labs: Recent Labs    09/04/19 0502  09/05/19 0017 09/05/19 0332 09/05/19 0854 09/06/19 0430  HGB 10.4*  --   --  9.9*  --  9.2*  HCT 32.5*  --   --  31.0*  --  29.4*  PLT 188  --   --  203  --  219  HEPARINUNFRC  --    < > 0.59  --  0.43 0.33  CREATININE 1.24*  --   --  1.13*  --  1.16*   < > = values in this interval not displayed.    Estimated Creatinine Clearance: 36.3 mL/min (A) (by C-G formula based on SCr of 1.16 mg/dL (H)).   Assessment: 85 yr old female with AFib on Eliquis PTA  and was transitioned to IV heparin on admit. Heparin was turned off for gastrostomy tube placement and LOA on 11/13, then resumed.   Heparin today is therapeutic at 0.33, CBC stable. Scr 1.16, CrCl 36.3 ml/min  Pharmacy is now consulted to transition pt from IV heparin infusion back to Eliquis. Pt's home dose of Eliquis is 2.5 mg po BID.  Goal of Therapy:  Heparin level 0.3-0.5 units/ml Monitor platelets by anticoagulation protocol: Yes   Plan:  Discontinue heparin infusion at 1800 today Start apixaban 2.5 mg PO BID (pt's home dose) at the same time that the heparin infusion is stopped Monitor CBC, signs/symptoms of bleeding  Gillermina Hu, PharmD, BCPS, [facility] Clinical Pharmacist 09/06/2019

## 2019-09-06 NOTE — Progress Notes (Signed)
ANTICOAGULATION CONSULT NOTE - Follow Up Consult  Pharmacy Consult for heparin Indication: atrial fibrillation  Allergies  Allergen Reactions  . Aspirin Other (See Comments)    Red splotches  . Atorvastatin Other (See Comments)    Muscle spams  . Nabumetone Itching  . Nsaids     Reaction (??)  . Pravastatin Other (See Comments)    Stiff walking difficulty Stiff walking difficulty  . Procaine Swelling  . Rosuvastatin Nausea Only and Other (See Comments)    Reflux and Muscle stiffness  . Simvastatin Nausea Only and Other (See Comments)    Reflux and Muscle stiffness  . Statins Other (See Comments)  . Evolocumab Rash  . Mobic [Meloxicam] Rash  . Penicillins Rash    Has patient had a PCN reaction causing immediate rash, facial/tongue/throat swelling, SOB or lightheadedness with hypotension: Yes Has patient had a PCN reaction causing severe rash involving mucus membranes or skin necrosis: No Has patient had a PCN reaction that required hospitalization: No Has patient had a PCN reaction occurring within the last 10 years: No If all of the above answers are "NO", then may proceed with Cephalosporin use.      Patient Measurements: Height: 5\' 3"  (160 cm) Weight: 171 lb 11.8 oz (77.9 kg) IBW/kg (Calculated) : 52.4 Heparin Dosing Weight: 69 kg   Vital Signs: Temp: 97.9 F (36.6 C) (11/18 0534) Temp Source: Oral (11/18 0534) BP: 120/72 (11/18 0534) Pulse Rate: 83 (11/18 0534)  Labs: Recent Labs    09/03/19 1146  09/04/19 0502  09/05/19 0017 09/05/19 0332 09/05/19 0854 09/06/19 0430  HGB  --    < > 10.4*  --   --  9.9*  --  9.2*  HCT  --   --  32.5*  --   --  31.0*  --  29.4*  PLT  --   --  188  --   --  203  --  219  APTT 52*  --   --   --   --   --   --   --   HEPARINUNFRC 0.25*   < >  --    < > 0.59  --  0.43 0.33  CREATININE  --   --  1.24*  --   --  1.13*  --  1.16*   < > = values in this interval not displayed.    Estimated Creatinine Clearance: 36.3 mL/min  (A) (by C-G formula based on SCr of 1.16 mg/dL (H)).   Assessment: 85 yr old female with AFib on Eliquis PTA and was transitioned to IV heparin on admit. Heparin was turned off for gastrostomy tube placement and LOA on 11/13 then resumed.   Heparin today is therapeutic at 0.33, CBC stable.  Goal of Therapy:  Heparin level 0.3-0.5 units/ml Monitor platelets by anticoagulation protocol: Yes   Plan:  -Continue heparin 1500 units/hr -Daily heparin level and CBC   Arrie Senate, PharmD, BCPS Clinical Pharmacist [phone] Please check AMION for all [place] numbers 09/06/2019

## 2019-09-07 ENCOUNTER — Inpatient Hospital Stay (HOSPITAL_COMMUNITY): Payer: Medicare Other

## 2019-09-07 DIAGNOSIS — L819 Disorder of pigmentation, unspecified: Secondary | ICD-10-CM

## 2019-09-07 DIAGNOSIS — I739 Peripheral vascular disease, unspecified: Secondary | ICD-10-CM

## 2019-09-07 DIAGNOSIS — Z8673 Personal history of transient ischemic attack (TIA), and cerebral infarction without residual deficits: Secondary | ICD-10-CM

## 2019-09-07 DIAGNOSIS — E785 Hyperlipidemia, unspecified: Secondary | ICD-10-CM

## 2019-09-07 DIAGNOSIS — M79675 Pain in left toe(s): Secondary | ICD-10-CM

## 2019-09-07 DIAGNOSIS — I10 Essential (primary) hypertension: Secondary | ICD-10-CM

## 2019-09-07 DIAGNOSIS — R52 Pain, unspecified: Secondary | ICD-10-CM

## 2019-09-07 DIAGNOSIS — I480 Paroxysmal atrial fibrillation: Secondary | ICD-10-CM

## 2019-09-07 LAB — CBC WITH DIFFERENTIAL/PLATELET
Abs Immature Granulocytes: 0.14 10*3/uL — ABNORMAL HIGH (ref 0.00–0.07)
Basophils Absolute: 0 10*3/uL (ref 0.0–0.1)
Basophils Relative: 0 %
Eosinophils Absolute: 0.3 10*3/uL (ref 0.0–0.5)
Eosinophils Relative: 4 %
HCT: 30.3 % — ABNORMAL LOW (ref 36.0–46.0)
Hemoglobin: 9.5 g/dL — ABNORMAL LOW (ref 12.0–15.0)
Immature Granulocytes: 2 %
Lymphocytes Relative: 24 %
Lymphs Abs: 1.7 10*3/uL (ref 0.7–4.0)
MCH: 29.7 pg (ref 26.0–34.0)
MCHC: 31.4 g/dL (ref 30.0–36.0)
MCV: 94.7 fL (ref 80.0–100.0)
Monocytes Absolute: 1.1 10*3/uL — ABNORMAL HIGH (ref 0.1–1.0)
Monocytes Relative: 15 %
Neutro Abs: 4 10*3/uL (ref 1.7–7.7)
Neutrophils Relative %: 55 %
Platelets: 211 10*3/uL (ref 150–400)
RBC: 3.2 MIL/uL — ABNORMAL LOW (ref 3.87–5.11)
RDW: 13.8 % (ref 11.5–15.5)
WBC: 7.2 10*3/uL (ref 4.0–10.5)
nRBC: 0 % (ref 0.0–0.2)

## 2019-09-07 LAB — GLUCOSE, CAPILLARY
Glucose-Capillary: 90 mg/dL (ref 70–99)
Glucose-Capillary: 115 mg/dL — ABNORMAL HIGH (ref 70–99)
Glucose-Capillary: 105 mg/dL — ABNORMAL HIGH (ref 70–99)
Glucose-Capillary: 102 mg/dL — ABNORMAL HIGH (ref 70–99)

## 2019-09-07 LAB — BASIC METABOLIC PANEL
Anion gap: 11 (ref 5–15)
BUN: 10 mg/dL (ref 8–23)
CO2: 24 mmol/L (ref 22–32)
Calcium: 8.4 mg/dL — ABNORMAL LOW (ref 8.9–10.3)
Chloride: 102 mmol/L (ref 98–111)
Creatinine, Ser: 1.26 mg/dL — ABNORMAL HIGH (ref 0.44–1.00)
GFR calc Af Amer: 46 mL/min — ABNORMAL LOW (ref >60)
GFR calc non Af Amer: 39 mL/min — ABNORMAL LOW (ref >60)
Glucose, Bld: 103 mg/dL — ABNORMAL HIGH (ref 70–99)
Potassium: 3.9 mmol/L (ref 3.5–5.1)
Sodium: 137 mmol/L (ref 135–145)

## 2019-09-07 LAB — TSH: TSH: 7.37 u[IU]/mL — ABNORMAL HIGH (ref 0.350–4.500)

## 2019-09-07 MED ORDER — AMIODARONE HCL 200 MG PO TABS: 400.0000 mg | ORAL_TABLET | Freq: Two times a day (BID) | ORAL | Status: DC

## 2019-09-07 MED ORDER — AMIODARONE HCL 200 MG PO TABS
200.0000 mg | ORAL_TABLET | Freq: Every day | ORAL | Status: DC
  Administered 2019-09-07 – 2019-09-26 (×20): 200 mg via ORAL
  Filled 2019-09-07 (×21): qty 1

## 2019-09-07 MED ORDER — HEPARIN (PORCINE) 25000 UT/250ML-% IV SOLN
1350.0000 [IU]/h | INTRAVENOUS | Status: DC
  Administered 2019-09-07: 1500 [IU]/h via INTRAVENOUS
  Administered 2019-09-08 – 2019-09-10 (×4): 1350 [IU]/h via INTRAVENOUS
  Filled 2019-09-07 (×5): qty 250

## 2019-09-07 MED ORDER — APIXABAN 5 MG PO TABS
5.0000 mg | ORAL_TABLET | Freq: Two times a day (BID) | ORAL | Status: DC
  Administered 2019-09-07: 5 mg via ORAL
  Filled 2019-09-07: qty 1

## 2019-09-07 NOTE — Progress Notes (Addendum)
Progress Note  Patient Name: Alyssa Odonnell Date of Encounter: 09/07/2019  Primary Cardiologist: Cristopher Peru, MD   Subjective   Denies CP or SOB.  maintaining NSR. Patient has foot pain this morning.  Inpatient Medications    Scheduled Meds:  apixaban  5 mg Oral BID   chlorhexidine  15 mL Mouth Rinse BID   docusate sodium  100 mg Oral Daily   feeding supplement (ENSURE ENLIVE)  237 mL Oral BID BM   latanoprost  1 drop Both Eyes QHS   levothyroxine  50 mcg Intravenous Daily   mouth rinse  15 mL Mouth Rinse q12n4p   pantoprazole (PROTONIX) IV  40 mg Intravenous Q24H   polyethylene glycol  17 g Oral Daily   sodium chloride flush  10-40 mL Intracatheter Q12H   Continuous Infusions:  amiodarone 30 mg/hr (09/07/19 0315)   dextrose 5 % and 0.45% NaCl 15 mL/hr at 09/05/19 1712   PRN Meds: acetaminophen, bisacodyl, dextrose 5 % and 0.45% NaCl, hydrALAZINE, methocarbamol, morphine injection, [DISCONTINUED] ondansetron **OR** ondansetron (ZOFRAN) IV, promethazine, sodium chloride flush, traMADol, white petrolatum   Vital Signs    Vitals:   09/06/19 1120 09/06/19 1631 09/06/19 2049 09/07/19 0628  BP: 129/83 (!) 142/71 127/74 123/67  Pulse: 89 88 88 79  Resp: 20 20 20 20   Temp: 97.8 F (36.6 C) 98.2 F (36.8 C) 99.6 F (37.6 C) 98 F (36.7 C)  TempSrc: Oral Oral Oral Oral  SpO2: 97% 97% 96% 98%  Weight:    76.8 kg  Height:        Intake/Output Summary (Last 24 hours) at 09/07/2019 0735 Last data filed at 09/07/2019 0630 Gross per 24 hour  Intake 1202.96 ml  Output 2150 ml  Net -947.04 ml   Last 3 Weights 09/07/2019 09/06/2019 09/05/2019  Weight (lbs) 169 lb 5 oz 171 lb 11.8 oz 170 lb 3.1 oz  Weight (kg) 76.8 kg 77.9 kg 77.2 kg      Telemetry    NSR, HR 70-80s - Personally Reviewed  ECG    No new - Personally Reviewed  Physical Exam   GEN: No acute distress.   Neck: No JVD Cardiac: RRR, systolic murmurs, rubs, or gallops.  Respiratory:  Clear to auscultation bilaterally. GI: Soft, mildly tender, non-distended  MS: No edema; No deformity. Neuro:  Nonfocal  Psych: Normal affect   Labs    High Sensitivity Troponin:   Recent Labs  Lab 08/29/19 0004 08/29/19 0409  TROPONINIHS 6 8      Chemistry Recent Labs  Lab 09/05/19 0332 09/06/19 0430 09/07/19 0540  NA 139 139 137  K 3.3* 2.9* 3.9  CL 106 104 102  CO2 23 25 24   GLUCOSE 110* 105* 103*  BUN 13 11 10   CREATININE 1.13* 1.16* 1.26*  CALCIUM 8.3* 8.2* 8.4*  GFRNONAA 45* 44* 39*  GFRAA 52* 50* 46*  ANIONGAP 10 10 11      Hematology Recent Labs  Lab 09/05/19 0332 09/06/19 0430 09/07/19 0540  WBC 8.4 8.8 7.2  RBC 3.34* 3.10* 3.20*  HGB 9.9* 9.2* 9.5*  HCT 31.0* 29.4* 30.3*  MCV 92.8 94.8 94.7  MCH 29.6 29.7 29.7  MCHC 31.9 31.3 31.4  RDW 13.7 13.9 13.8  PLT 203 219 211    BNPNo results for input(s): BNP, PROBNP in the last 168 hours.   DDimer No results for input(s): DDIMER in the last 168 hours.   Radiology    No results found.  Cardiac Studies  Echo 2018 Left ventricle: The cavity size was normal. Systolic function was normal. The estimated ejection fraction was in the range of 60% to 65%. Wall motion was normal; there were no regional wall motion abnormalities. Left ventricular diastolic function parameters were normal. - Aortic valve: There was very mild stenosis. There was no regurgitation. Peak velocity (S): 211 cm/s. Mean gradient (S): 10 mm Hg. Valve area (VTI): 1.49 cm^2. Valve area (Vmax): 1.36 cm^2. Valve area (Vmean): 1.34 cm^2. - Mitral valve: Transvalvular velocity was within the normal range. There was no evidence for stenosis. There was no regurgitation. - Right ventricle: The cavity size was normal. Wall thickness was normal. Systolic function was normal. - Atrial septum: No defect or patent foramen ovale was identified by color flow Doppler. - Tricuspid valve: There was mild regurgitation. -  Pulmonary arteries: Systolic pressure was within the normal range. PA peak pressure: 23 mm Hg (S).  Patient Profile     85 y.o. female a hx of HTN, PAFon Eliquis, CVA, HLD, CKD III, mild AS, hypothyroidism who was seen by cardiology for surgical clearance for surgery for hiatal hernia.  Assessment & Plan    Paroxysmal atrial fibrillation with RVR Patient presented with abdominal pain, found to have a large hiatal hernia with gastric outlet obstruction.Surgery was held originally for Afib RVR, rates 100-110.Patient is now post-op day6 on soft diet.She was initially started on a diltiazem gtt and transitioned to amiodarone gtt due to hypotension.Patient has been in and out of afib. - Patient has been in NSR -Now that she is tolerating diet fairly well, we will change her over to amiodarone p.o. 200 mg a day and Eliquis 5 mg twice a day   hiatal Hernia post-op Day 5 - per surgery - Soft diet  HTN -BPstable. Home benicar on hold  Aortic Stenosis -mild on echo 2018. -No complaints of chest pain or syncope to suggest progression of disease -Continue to monitor routinely outpatient  PVD -Vascular surgery has been consulted regarding foot pain. Has known PVD  We will go ahead and sign off.  We will have her follow-up in clinic.  For questions or updates, please contact [place] Please consult [email or web address] for contact info under        Signed, Cadence Ninfa Meeker, PA-C  09/07/2019, 7:35 AM    Personally seen and examined. Agree with above.   No chest pain, no shortness of breath, maintaining sinus rhythm  Alert and oriented x3 regular rate and rhythm lungs are clear did have some slight duskiness to her left fifth toe posterior aspect  Atrial fibrillation paroxysmal -Amiodarone p.o. 200 a day -Eliquis 5 mg twice a day  PVD -Vascular surgery consulted  We will follow her up in clinic.  We will sign off.  Candee Furbish, MD

## 2019-09-07 NOTE — Progress Notes (Signed)
PROGRESS NOTE    Alyssa Odonnell  [id number] DOB: 1933/10/26 DOA: 08/28/2019 PCP: Drake Leach, MD   Brief Narrative: 85 year old who ambulate with help of walker, past medical history PAF Eliquis, embolic CVA, hypertension, hyperlipidemia, PAD, cholecystectomy who presented to [facility] ED on 08/28/2019 due to abrupt onset of severe nausea, dry heaving without vomiting, chest epigastric abdominal pain.  CT abdomen showed large hiatal hernia with gastric volvulus with gastric outlet obstruction.  Multiple unsuccessful attempts to pass NG tube at bedside.  IR consulted for NG tube under fluoroscopy.  General surgery consulted and patient was taken to the OR on 11/13 for laparoscopic gastropexy with placement of a 20 French gastrostomy tube with extensive lysis of adhesion.    Assessment & Plan:   Principal Problem:   Volvulus of stomach Active Problems:   PAD (peripheral artery disease) (HCC)   Essential hypertension   PAF (paroxysmal atrial fibrillation) (HCC)   Gastric outlet obstruction   1-Large hiatal hernia with gastric outlet obstruction status post laparoscopic gastropexy with placement of gastrostomy tube with extensive lysis of adhesions. Patient initially presented with leukocytosis, CT abdomen pelvis showed gastric outlet obstruction. General surgery was consulted and patient underwent laparoscopic gastropexy with placement of gastrostomy tube and extensive lysis of adhesions. Tolerating diet, report poor oral intake. No BM since yesterday,  Per general surgery patient will require GT for 6 weeks  2-4, 5 toes pain, discoloration since last night; concern for ischemia I will order ABI.  Vascular has been consulted.  Patient on eliquis already.   Hypokalemia: Resolved.   -Paroxysmal A. Fib: Patient was transition from heparin Gtt to eliquis 11-18. Plan to transition to oral amiodarone.   -Hypertension; became hypotensive with A. fib RVR on Cardizem.  Hydrochlorothiazide and Benicar on hold.  -Hypothyroidism: Continue with Synthroid  -Chronic kidney disease a stage IIIb: Monitor kidney function daily  Anemia of chronic disease: Worsening by recent surgery.  Monitor hemoglobin daily.     Estimated body mass index is 29.99 kg/m as calculated from the following:   Height as of this encounter: 5\' 3"  (1.6 m).   Weight as of this encounter: 76.8 kg.   DVT prophylaxis: Eliquis Code Status: Full code Family Communication: Discussed with patient Disposition Plan: She will require skilled nursing facility, social worker consulted Consultants:  Cardiology Surgery  Procedures:  laparoscopic gastropexy with placement of a 47 French gastrostomy tube with extensive lysis of adhesion.11-13    Antimicrobials:    Subjective: She report tolerating full liquid diet, ate too much yesterday morning, she had diner late last night, she was still feeling full. No BM. She think will need suppository.  rweport pain 4, 5 toes, since yesterday afternoon after she went for a walk.   Objective: Vitals:   09/06/19 1120 09/06/19 1631 09/06/19 2049 09/07/19 0628  BP: 129/83 (!) 142/71 127/74 123/67  Pulse: 89 88 88 79  Resp: 20 20 20 20   Temp: 97.8 F (36.6 C) 98.2 F (36.8 C) 99.6 F (37.6 C) 98 F (36.7 C)  TempSrc: Oral Oral Oral Oral  SpO2: 97% 97% 96% 98%  Weight:    76.8 kg  Height:        Intake/Output Summary (Last 24 hours) at 09/07/2019 0752 Last data filed at 09/07/2019 0630 Gross per 24 hour  Intake 1202.96 ml  Output 2150 ml  Net -947.04 ml   Filed Weights   09/05/19 0500 09/06/19 0534 09/07/19 0628  Weight: 77.2 kg 77.9 kg 76.8 kg  Examination:  General exam: NAD Respiratory system: CTA Cardiovascular system: S 1, S 2 IRR, systolic murmur Gastrointestinal system: BS present, soft, mild tender, incision healing G-tube in place Central nervous system: Non focal.  Extremities: Symmetric power.  Skin: 4, 5 toe  plantar aspect discoloration. Bluish.    Data Reviewed: I have personally reviewed following labs and imaging studies  CBC: Recent Labs  Lab 09/03/19 0308 09/04/19 0502 09/05/19 0332 09/06/19 0430 09/07/19 0540  WBC 9.4 9.0 8.4 8.8 7.2  NEUTROABS 6.5 5.9 5.1 4.8 4.0  HGB 10.5* 10.4* 9.9* 9.2* 9.5*  HCT 32.6* 32.5* 31.0* 29.4* 30.3*  MCV 94.2 93.7 92.8 94.8 94.7  PLT 176 188 203 [id number]   Basic Metabolic Panel: Recent Labs  Lab 09/03/19 0308 09/04/19 0502 09/05/19 0332 09/06/19 0430 09/07/19 0540  NA 139 136 139 139 137  K 3.3* 3.8 3.3* 2.9* 3.9  CL 107 103 106 104 102  CO2 22 22 23 25 24   GLUCOSE 98 104* 110* 105* 103*  BUN 21 20 13 11 10   CREATININE 1.27* 1.24* 1.13* 1.16* 1.26*  CALCIUM 8.4* 8.4* 8.3* 8.2* 8.4*   GFR: Estimated Creatinine Clearance: 33.2 mL/min (A) (by C-G formula based on SCr of 1.26 mg/dL (H)). Liver Function Tests: No results for input(s): AST, ALT, ALKPHOS, BILITOT, PROT, ALBUMIN in the last 168 hours. No results for input(s): LIPASE, AMYLASE in the last 168 hours. No results for input(s): AMMONIA in the last 168 hours. Coagulation Profile: No results for input(s): INR, PROTIME in the last 168 hours. Cardiac Enzymes: No results for input(s): CKTOTAL, CKMB, CKMBINDEX, TROPONINI in the last 168 hours. BNP (last 3 results) No results for input(s): PROBNP in the last 8760 hours. HbA1C: No results for input(s): HGBA1C in the last 72 hours. CBG: Recent Labs  Lab 09/05/19 1518 09/06/19 0039 09/06/19 0757 09/06/19 1627 09/06/19 2107  GLUCAP 85 97 98 106* 132*   Lipid Profile: No results for input(s): CHOL, HDL, LDLCALC, TRIG, CHOLHDL, LDLDIRECT in the last 72 hours. Thyroid Function Tests: No results for input(s): TSH, T4TOTAL, FREET4, T3FREE, THYROIDAB in the last 72 hours. Anemia Panel: No results for input(s): VITAMINB12, FOLATE, FERRITIN, TIBC, IRON, RETICCTPCT in the last 72 hours. Sepsis Labs: No results for input(s):  PROCALCITON, LATICACIDVEN in the last 168 hours.  Recent Results (from the past 240 hour(s))  SARS CORONAVIRUS 2 (TAT 6-24 HRS) Nasopharyngeal Nasopharyngeal Swab     Status: None   Collection Time: 08/29/19  5:30 AM   Specimen: Nasopharyngeal Swab  Result Value Ref Range Status   SARS Coronavirus 2 NEGATIVE NEGATIVE Final    Comment: (NOTE) SARS-CoV-2 target nucleic acids are NOT DETECTED. The SARS-CoV-2 RNA is generally detectable in upper and lower respiratory specimens during the acute phase of infection. Negative results do not preclude SARS-CoV-2 infection, do not rule out co-infections with other pathogens, and should not be used as the sole basis for treatment or other patient management decisions. Negative results must be combined with clinical observations, patient history, and epidemiological information. The expected result is Negative. Fact Sheet for Patients: SugarRoll.be Fact Sheet for Healthcare Providers: https://www.woods-mathews.com/ This test is not yet approved or cleared by the [place] FDA and  has been authorized for detection and/or diagnosis of SARS-CoV-2 by FDA under an Emergency Use Authorization (EUA). This EUA will remain  in effect (meaning this test can be used) for the duration of the COVID-19 declaration under Section 56 4(b)(1) of the Act, 21 U.S.C. section 360bbb-3(b)(1), unless  the authorization is terminated or revoked sooner. Performed at [facility], [place] [address], [place], [place] [address]   Surgical pcr screen     Status: None   Collection Time: 09/01/19  2:16 PM   Specimen: Nasal Mucosa; Nasal Swab  Result Value Ref Range Status   MRSA, PCR NEGATIVE NEGATIVE Final   Staphylococcus aureus NEGATIVE NEGATIVE Final    Comment: (NOTE) The Xpert SA Assay (FDA approved for NASAL specimens in patients 22 years of age and older), is one component of a comprehensive surveillance program.  It is not intended to diagnose infection nor to guide or monitor treatment. Performed at [facility], [place] [address], [place], [place] [address]   SARS CORONAVIRUS 2 (TAT 6-24 HRS) Nasopharyngeal Nasopharyngeal Swab     Status: None   Collection Time: 09/06/19  4:34 PM   Specimen: Nasopharyngeal Swab  Result Value Ref Range Status   SARS Coronavirus 2 NEGATIVE NEGATIVE Final    Comment: (NOTE) SARS-CoV-2 target nucleic acids are NOT DETECTED. The SARS-CoV-2 RNA is generally detectable in upper and lower respiratory specimens during the acute phase of infection. Negative results do not preclude SARS-CoV-2 infection, do not rule out co-infections with other pathogens, and should not be used as the sole basis for treatment or other patient management decisions. Negative results must be combined with clinical observations, patient history, and epidemiological information. The expected result is Negative. Fact Sheet for Patients: SugarRoll.be Fact Sheet for Healthcare Providers: https://www.woods-mathews.com/ This test is not yet approved or cleared by the [place] FDA and  has been authorized for detection and/or diagnosis of SARS-CoV-2 by FDA under an Emergency Use Authorization (EUA). This EUA will remain  in effect (meaning this test can be used) for the duration of the COVID-19 declaration under Section 56 4(b)(1) of the Act, 21 U.S.C. section 360bbb-3(b)(1), unless the authorization is terminated or revoked sooner. Performed at [facility], [place] [address] [place], [place] [address]          Radiology Studies: No results found.      Scheduled Meds: . apixaban  5 mg Oral BID  . chlorhexidine  15 mL Mouth Rinse BID  . docusate sodium  100 mg Oral Daily  . feeding supplement (ENSURE ENLIVE)  237 mL Oral BID BM  . latanoprost  1 drop Both Eyes QHS  . levothyroxine  50 mcg Intravenous Daily  . mouth rinse   15 mL Mouth Rinse q12n4p  . pantoprazole (PROTONIX) IV  40 mg Intravenous Q24H  . polyethylene glycol  17 g Oral Daily  . sodium chloride flush  10-40 mL Intracatheter Q12H   Continuous Infusions: . amiodarone 30 mg/hr (09/07/19 0315)  . dextrose 5 % and 0.45% NaCl 15 mL/hr at 09/05/19 1712     LOS: 9 days    Time spent: 35 minutes.     Elmarie Shiley, MD [facility]   If 7PM-7AM, please contact night-coverage [email or web address] Password [email or web address] 09/07/2019, 7:52 AM

## 2019-09-07 NOTE — Progress Notes (Addendum)
ANTICOAGULATION CONSULT NOTE - Follow Up Consult  Pharmacy Consult for apixaban Indication: atrial fibrillation  Allergies  Allergen Reactions  . Aspirin Other (See Comments)    Red splotches  . Atorvastatin Other (See Comments)    Muscle spams  . Nabumetone Itching  . Nsaids     Reaction (??)  . Pravastatin Other (See Comments)    Stiff walking difficulty Stiff walking difficulty  . Procaine Swelling  . Rosuvastatin Nausea Only and Other (See Comments)    Reflux and Muscle stiffness  . Simvastatin Nausea Only and Other (See Comments)    Reflux and Muscle stiffness  . Statins Other (See Comments)  . Evolocumab Rash  . Mobic [Meloxicam] Rash  . Penicillins Rash    Has patient had a PCN reaction causing immediate rash, facial/tongue/throat swelling, SOB or lightheadedness with hypotension: Yes Has patient had a PCN reaction causing severe rash involving mucus membranes or skin necrosis: No Has patient had a PCN reaction that required hospitalization: No Has patient had a PCN reaction occurring within the last 10 years: No If all of the above answers are "NO", then may proceed with Cephalosporin use.      Patient Measurements: Height: 5\' 3"  (160 cm) Weight: 169 lb 5 oz (76.8 kg) IBW/kg (Calculated) : 52.4 Heparin Dosing Weight: 69 kg   Vital Signs: Temp: 98 F (36.7 C) (11/19 0628) Temp Source: Oral (11/19 0628) BP: 123/67 (11/19 0628) Pulse Rate: 79 (11/19 0628)  Labs: Recent Labs    09/05/19 0017  09/05/19 0332 09/05/19 0854 09/06/19 0430 09/07/19 0540  HGB  --    < > 9.9*  --  9.2* 9.5*  HCT  --   --  31.0*  --  29.4* 30.3*  PLT  --   --  203  --  219 211  HEPARINUNFRC 0.59  --   --  0.43 0.33  --   CREATININE  --   --  1.13*  --  1.16* 1.26*   < > = values in this interval not displayed.    Estimated Creatinine Clearance: 33.2 mL/min (A) (by C-G formula based on SCr of 1.26 mg/dL (H)).   Assessment: 85 yr old female with AFib on Eliquis PTA and was  transitioned to IV heparin on admit. Heparin was turned off for gastrostomy tube placement and LOA on 11/13, then resumed.   Heparin was transitioned back to home apixaban yesterday 11/18. Upon review of records, pt was originally on 5mg  BID which was reduced 02/2018 to 2.5mg  BID due to SCr being >1.5 and age being >23. However, Cr has improved this admission and remained less than 1.5 since 11/10. Will increase to appropriate dose for now and follow Cr closely given hx of embolic CVA.   Goal of Therapy:  Heparin level 0.3-0.5 units/ml Monitor platelets by anticoagulation protocol: Yes   Plan:  Increase apixaban to 5mg  BID Watch Cr and S/Sx bleeding closely   ADDENDUM: Pt now with severe pain in toes with concern for extremity ischemia. Pharmacy asked to hold apixaban and resume heparin with plans for lower extremity arteriogram on Monday 11/23. Last dose of apixaban was this morning at 1046. Heparin previously therapeutic at 1500 units/hr.  Plan: -Hold apixaban -Resume IV heparin 1500 units/hr no bolus at 2230 -Check 8hr aPTT and heparin level   Arrie Senate, PharmD, BCPS Clinical Pharmacist [phone] Please check AMION for all [place] numbers 09/07/2019

## 2019-09-07 NOTE — Progress Notes (Signed)
ABI has been completed.   Preliminary results in CV Proc.   Alyssa Odonnell 09/07/2019 1:00 PM

## 2019-09-07 NOTE — Progress Notes (Signed)
PT Cancellation Note  Patient Details Name: Alyssa Odonnell MRN: [id number] DOB: 11/07/1933   Cancelled Treatment:    Reason Eval/Treat Not Completed: Pain limiting ability to participate. Painful toes.   Shary Decamp Maycok 09/07/2019, 2:06 PM [place] Pager [phone] Office [phone]

## 2019-09-07 NOTE — Consult Note (Addendum)
Hospital Consult    Reason for Consult:  Pain in L toes 3-5 since walking yesterday Requesting Physician:  Dr. Tyrell Antonio MRN #:  [id number]  History of Present Illness: This is a 85 y.o. female with past medical history significant for embolic CVA, hypertension, hyperlipidemia, paroxysmal atrial fibrillation on Eliquis, and PAD with history of right SFA stent by Dr. Scot Dock in 2017.  She was admitted to the hospital for hiatal hernia repair due to gastric outlet obstruction.  Since being out of bed and walking the halls yesterday she developed severe pain in toes 3 through 5 of her left foot that has progressively gotten worse.  She has a mild color change to these toes as well.  She has history of atheroembolic episode of right lower extremity which led to her right SFA stent in 2017.  SFA stent has since been known to be occluded and based on ABIs in January of last year she likely has left SFA disease.  She denies motor or sensory deficit left foot.  She was transitioned from IV heparin to Eliquis yesterday.  Past medical history also significant for CKD stage III.    Past Medical History:  Diagnosis Date  . Gastric outlet obstruction 08/2019  . Glaucoma   . Hyperlipidemia   . Hypertension   . Hypothyroid   . PAF (paroxysmal atrial fibrillation) ([place])   . Stroke [facility])     Past Surgical History:  Procedure Laterality Date  . ABDOMINAL HERNIA REPAIR    . GASTROSTOMY N/A 09/01/2019   Procedure: INSERTION OF GASTROSTOMY TUBE;  Surgeon: Erroll Luna, MD;  Location: [place];  Service: General;  Laterality: N/A;  . LAPAROSCOPIC LYSIS OF ADHESIONS  09/01/2019   Procedure: Laparoscopic Lysis Of Adhesions;  Surgeon: Erroll Luna, MD;  Location: [place];  Service: General;;  . LAPAROSCOPIC NISSEN FUNDOPLICATION N/A [id number]   Procedure: LAPAROSCOPIC, POSSIBLE OPEN, GASTROPEXY;  Surgeon: Erroll Luna, MD;  Location: [place];  Service: General;  Laterality: N/A;  . LOOP RECORDER INSERTION  N/A 03/11/2017   Procedure: Loop Recorder Insertion;  Surgeon: Evans Lance, MD;  Location: [place] CV LAB;  Service: Cardiovascular;  Laterality: N/A;  . PERIPHERAL VASCULAR CATHETERIZATION Right 07/06/2016   Procedure: Lower Extremity Angiography;  Surgeon: Angelia Mould, MD;  Location: [place] CV LAB;  Service: Cardiovascular;  Laterality: Right;  . PERIPHERAL VASCULAR CATHETERIZATION Right 07/06/2016   Procedure: Peripheral Vascular Intervention;  Surgeon: Angelia Mould, MD;  Location: [place] CV LAB;  Service: Cardiovascular;  Laterality: Right;  Superficial femoral  . TEE WITHOUT CARDIOVERSION N/A 03/11/2017   Procedure: TRANSESOPHAGEAL ECHOCARDIOGRAM (TEE);  Surgeon: Acie Fredrickson Wonda Cheng, MD;  Location: [facility] ENDOSCOPY;  Service: Cardiovascular;  Laterality: N/A;  . TOTAL HIP ARTHROPLASTY      Allergies  Allergen Reactions  . Aspirin Other (See Comments)    Red splotches  . Atorvastatin Other (See Comments)    Muscle spams  . Nabumetone Itching  . Nsaids     Reaction (??)  . Pravastatin Other (See Comments)    Stiff walking difficulty Stiff walking difficulty  . Procaine Swelling  . Rosuvastatin Nausea Only and Other (See Comments)    Reflux and Muscle stiffness  . Simvastatin Nausea Only and Other (See Comments)    Reflux and Muscle stiffness  . Statins Other (See Comments)  . Evolocumab Rash  . Mobic [Meloxicam] Rash  . Penicillins Rash    Has patient had a PCN reaction causing immediate rash, facial/tongue/throat swelling, SOB  or lightheadedness with hypotension: Yes Has patient had a PCN reaction causing severe rash involving mucus membranes or skin necrosis: No Has patient had a PCN reaction that required hospitalization: No Has patient had a PCN reaction occurring within the last 10 years: No If all of the above answers are "NO", then may proceed with Cephalosporin use.      Prior to Admission medications   Medication Sig Start Date End Date  Taking? Authorizing Provider  acetaminophen (TYLENOL) 500 MG tablet Take 1,000 mg by mouth daily as needed for mild pain.    Yes [provider]  ELIQUIS 5 MG TABS tablet TAKE 1 TABLET BY MOUTH TWO  TIMES DAILY Patient taking differently: Take 2.5 mg by mouth 2 (two) times daily.  03/14/19  Yes Evans Lance, MD  furosemide (LASIX) 40 MG tablet Take 40 mg by mouth daily as needed for edema. (swelling in feet) 05/11/16  Yes [provider]  latanoprost (XALATAN) 0.005 % ophthalmic solution Place 1 drop into both eyes at bedtime. 05/11/16  Yes [provider]  levothyroxine (SYNTHROID, LEVOTHROID) 100 MCG tablet Take 100 mcg by mouth daily before breakfast.  05/11/16  Yes [provider]  olmesartan-hydrochlorothiazide (BENICAR HCT) 20-12.5 MG tablet Take 1 tablet by mouth daily.   Yes [provider]  omeprazole (PRILOSEC) 20 MG capsule Take 20 mg by mouth daily before breakfast. 08/05/16  Yes [provider]  sertraline (ZOLOFT) 100 MG tablet Take 150 mg by mouth every morning. 08/05/16  Yes [provider]  triamcinolone cream (KENALOG) 0.1 % Apply 1 application topically daily as needed for itching.   Yes [provider]    Social History   Socioeconomic History  . Marital status: Widowed    Spouse name: Not on file  . Number of children: Not on file  . Years of education: Not on file  . Highest education level: Not on file  Occupational History  . Not on file  Social Needs  . Financial resource strain: Not on file  . Food insecurity    Worry: Not on file    Inability: Not on file  . Transportation needs    Medical: Not on file    Non-medical: Not on file  Tobacco Use  . Smoking status: Never Smoker  . Smokeless tobacco: Never Used  Substance and Sexual Activity  . Alcohol use: No  . Drug use: No  . Sexual activity: Not on file  Lifestyle  . Physical activity    Days per week: Not on file    Minutes per  session: Not on file  . Stress: Not on file  Relationships  . Social Herbalist on phone: Not on file    Gets together: Not on file    Attends religious service: Not on file    Active member of club or organization: Not on file    Attends meetings of clubs or organizations: Not on file    Relationship status: Not on file  . Intimate partner violence    Fear of current or ex partner: Not on file    Emotionally abused: Not on file    Physically abused: Not on file    Forced sexual activity: Not on file  Other Topics Concern  . Not on file  Social History Narrative  . Not on file     Family History  Problem Relation Age of Onset  . Stroke Father     ROS: Otherwise  negative unless mentioned in HPI  Physical Examination  Vitals:   09/07/19 0628 09/07/19 0821  BP: 123/67 139/75  Pulse: 79 78  Resp: 20 15  Temp: 98 F (36.7 C) 98.4 F (36.9 C)  SpO2: 98% 100%   Body mass index is 29.99 kg/m.  General:  WDWN in NAD Gait: Not observed HENT: WNL, normocephalic Pulmonary: normal non-labored breathing, without Rales, rhonchi,  wheezing Cardiac: irregular Abdomen:  soft, NT/ND, no masses Skin: without rashes Vascular Exam/Pulses: Right AT and PT by Doppler; left PT and DP by Doppler Extremities: Feet are symmetrically warm to touch with motor and sensation intact; severe pain to light touch of left toes 3 through 5; mild darkening especially left fifth toe Musculoskeletal: no muscle wasting or atrophy  Neurologic: A&O X 3;  No focal weakness or paresthesias are detected; speech is fluent/normal Psychiatric:  The pt has Normal affect. Lymph:  Unremarkable  CBC    Component Value Date/Time   WBC 7.2 09/07/2019 0540   RBC 3.20 (L) 09/07/2019 0540   HGB 9.5 (L) 09/07/2019 0540   HGB 12.1 03/03/2018 1352   HCT 30.3 (L) 09/07/2019 0540   HCT 37.7 03/03/2018 1352   PLT 211 09/07/2019 0540   PLT 211 03/03/2018 1352   MCV 94.7 09/07/2019 0540   MCV 94  03/03/2018 1352   MCH 29.7 09/07/2019 0540   MCHC 31.4 09/07/2019 0540   RDW 13.8 09/07/2019 0540   RDW 14.4 03/03/2018 1352   LYMPHSABS 1.7 09/07/2019 0540   LYMPHSABS 2.4 03/03/2018 1352   MONOABS 1.1 (H) 09/07/2019 0540   EOSABS 0.3 09/07/2019 0540   EOSABS 0.2 03/03/2018 1352   BASOSABS 0.0 09/07/2019 0540   BASOSABS 0.0 03/03/2018 1352    BMET    Component Value Date/Time   NA 137 09/07/2019 0540   NA 141 03/03/2018 1352   K 3.9 09/07/2019 0540   CL 102 09/07/2019 0540   CO2 24 09/07/2019 0540   GLUCOSE 103 (H) 09/07/2019 0540   BUN 10 09/07/2019 0540   BUN 31 (H) 03/03/2018 1352   CREATININE 1.26 (H) 09/07/2019 0540   CALCIUM 8.4 (L) 09/07/2019 0540   GFRNONAA 39 (L) 09/07/2019 0540   GFRAA 46 (L) 09/07/2019 0540    COAGS: Lab Results  Component Value Date   INR 0.98 03/09/2017     Non-Invasive Vascular Imaging:   ABI pending    ASSESSMENT/PLAN: This is a 85 y.o. female with severe pain and mild discoloration of toes 3 through 5 left foot  Severe pain on toes 3 through 5 on exam despite DP and PT signal by doppler with intact motor and sensory left foot She has a history of atheroembolic episode of right lower extremity that led to right SFA stent in 2017 by Dr. Scot Dock ABIs pending We will plan to perform left lower extremity arteriogram on Monday with Dr. Donzetta Matters She will need to be transitioned back to IV heparin from Eliquis She will also need to be hydrated over the weekend in preparation for contrast If over the weekend the pain in her left foot subsides we will reevaluate and could probably follow PAD as an outpatient On-call vascular surgeon Dr. Carlis Abbott will evaluate the patient later today and provide further treatment plans   Dagoberto Ligas PA-C [facility] [phone]  I have seen and evaluated the patient. I agree with the PA note as documented above.  85 year old female with history of CVA, paroxysmal A. fib on Eliquis, PAD  with previous right SFA intervention by Dr. Scot Dock in 0000000 for atheroembolic event that vascular surgey has been consulted for left toe 3 through 5 pain with some discoloration starting yesterday.  Patient was initially admitted with gastric outlet obstruction secondary to large hiatal hernia after she developed acute symptoms.  She initially underwent laparoscopic gastropexy with placement of a gastrostomy tube by Dr. Brantley Stage on 09/01/2019.  She states previous to yesterday her left foot felt fine.  She was walking when her symptoms started, no trauma.  Her ABIs on the left reveal ABI of 0.51 with biphasic waveforms at the ankles.  She does not have palpable pedal pulses. Her left 5th toe does look cyanotic on the base.  No overt tissue loss. Looking back at her arteriogram imaging from 2017 Dr. Scot Dock noted that she had left SFA occlusion at [facility] canal with three-vessel runoff.  Plan is for left lower extremity arteriogram on Monday with Dr. Donzetta Matters.  Please hold the patient's Eliquis.  She will need gentle hydration for kidney function.  Of note, her right SFA stent is known to be occluded.    Marty Heck, MD [facility] [place] Office: [phone] Pager: [phone]

## 2019-09-07 NOTE — Progress Notes (Signed)
[facility]/Trauma Progress Note  6 Days Post-Op   Assessment/Plan Hx CVA Hypothyroidism HTN HLD Paroxysmal A Fib on Elquis  Afib with RVR  Hiatal Hernia with Gastric Outlet Obstruction S/p laparoscopic gastropexy with g-tube, LOA 11/13 Dr. Brantley Stage - POD#6 -flush G-tube daily, tolerating having it clamped - mobilize!  FEN -soft diet VTE -SCDs, Eliquis  ID -cipro pre-op Follow up: Dr. Brantley Stage  Plan: Pt is doing great from a surgical standpoint. G tube will need to stay in for at least 6 weeks. She will need to flush at home   LOS: 9 days    Subjective: CC: L 5th toe pain  Pt states increased toe pain overnight with associated nausea. She denies trauma to her toe. She thinks it is a circulation issues. No vomiting. Tolerating diet and very minimal abdominal pain.   Objective: Vital signs in last 24 hours: Temp:  [97.8 F (36.6 C)-99.6 F (37.6 C)] 98 F (36.7 C) (11/19 0628) Pulse Rate:  [78-89] 79 (11/19 0628) Resp:  [19-20] 20 (11/19 0628) BP: (123-142)/(67-83) 123/67 (11/19 0628) SpO2:  [96 %-99 %] 98 % (11/19 0628) Weight:  [76.8 kg] 76.8 kg (11/19 0628) Last BM Date: 09/05/19  Intake/Output from previous day: 11/18 0701 - 11/19 0700 In: 1203 [P.O.:240; I.V.:540.8; IV Piggyback:302.1] Out: 2150 [Urine:2150] Intake/Output this shift: No intake/output data recorded.  PE: Gen:  Alert, NAD, pleasant, cooperative Pulm:  Rate and effort normal Abd: Soft, ND, +BS, incisions with glue intact are well appearing, scant SS drainage around G tube but no erythema or induration to suggest cellulitis, mild TTP of upper abdomen without guarding, no peritontis, G tube clamped. sutures in place of G tube. Skin: no rashes noted, warm and dry Extremities: L 5th toe is purple on the bottom and very TTP   Anti-infectives: Anti-infectives (From admission, onward)   Start     Dose/Rate Route Frequency Ordered Stop   09/01/19 1400  ciprofloxacin (CIPRO)  IVPB 400 mg     400 mg 200 mL/hr over 60 Minutes Intravenous On call to O.R. 09/01/19 0738 09/01/19 1615      Lab Results:  Recent Labs    09/06/19 0430 09/07/19 0540  WBC 8.8 7.2  HGB 9.2* 9.5*  HCT 29.4* 30.3*  PLT 219 211   BMET Recent Labs    09/06/19 0430 09/07/19 0540  NA 139 137  K 2.9* 3.9  CL 104 102  CO2 25 24  GLUCOSE 105* 103*  BUN 11 10  CREATININE 1.16* 1.26*  CALCIUM 8.2* 8.4*   PT/INR No results for input(s): LABPROT, INR in the last 72 hours. CMP     Component Value Date/Time   NA 137 09/07/2019 0540   NA 141 03/03/2018 1352   K 3.9 09/07/2019 0540   CL 102 09/07/2019 0540   CO2 24 09/07/2019 0540   GLUCOSE 103 (H) 09/07/2019 0540   BUN 10 09/07/2019 0540   BUN 31 (H) 03/03/2018 1352   CREATININE 1.26 (H) 09/07/2019 0540   CALCIUM 8.4 (L) 09/07/2019 0540   PROT 7.0 08/29/2019 0004   ALBUMIN 3.9 08/29/2019 0004   AST 16 08/29/2019 0004   ALT 14 08/29/2019 0004   ALKPHOS 81 08/29/2019 0004   BILITOT 0.6 08/29/2019 0004   GFRNONAA 39 (L) 09/07/2019 0540   GFRAA 46 (L) 09/07/2019 0540   Lipase     Component Value Date/Time   LIPASE 30 08/29/2019 0004    Studies/Results: No results found.   Kalman Drape, PA-C  [facility] Please see amion for pager for the following: M, T, W, & Friday 7:00am - 4:30pm Thursdays 7:00am -11:30am

## 2019-09-08 LAB — CBC WITH DIFFERENTIAL/PLATELET
Abs Immature Granulocytes: 0.24 10*3/uL — ABNORMAL HIGH (ref 0.00–0.07)
Basophils Absolute: 0 10*3/uL (ref 0.0–0.1)
Basophils Relative: 0 %
Eosinophils Absolute: 0.3 10*3/uL (ref 0.0–0.5)
Eosinophils Relative: 3 %
HCT: 30.5 % — ABNORMAL LOW (ref 36.0–46.0)
Hemoglobin: 9.9 g/dL — ABNORMAL LOW (ref 12.0–15.0)
Immature Granulocytes: 3 %
Lymphocytes Relative: 23 %
Lymphs Abs: 2 10*3/uL (ref 0.7–4.0)
MCH: 30.1 pg (ref 26.0–34.0)
MCHC: 32.5 g/dL (ref 30.0–36.0)
MCV: 92.7 fL (ref 80.0–100.0)
Monocytes Absolute: 1.1 10*3/uL — ABNORMAL HIGH (ref 0.1–1.0)
Monocytes Relative: 13 %
Neutro Abs: 4.9 10*3/uL (ref 1.7–7.7)
Neutrophils Relative %: 58 %
Platelets: 221 10*3/uL (ref 150–400)
RBC: 3.29 MIL/uL — ABNORMAL LOW (ref 3.87–5.11)
RDW: 13.3 % (ref 11.5–15.5)
WBC: 8.5 10*3/uL (ref 4.0–10.5)
nRBC: 0 % (ref 0.0–0.2)

## 2019-09-08 LAB — BASIC METABOLIC PANEL
Anion gap: 10 (ref 5–15)
BUN: 11 mg/dL (ref 8–23)
CO2: 26 mmol/L (ref 22–32)
Calcium: 8.5 mg/dL — ABNORMAL LOW (ref 8.9–10.3)
Chloride: 99 mmol/L (ref 98–111)
Creatinine, Ser: 1.39 mg/dL — ABNORMAL HIGH (ref 0.44–1.00)
GFR calc Af Amer: 41 mL/min — ABNORMAL LOW (ref >60)
GFR calc non Af Amer: 35 mL/min — ABNORMAL LOW (ref >60)
Glucose, Bld: 98 mg/dL (ref 70–99)
Potassium: 3.9 mmol/L (ref 3.5–5.1)
Sodium: 135 mmol/L (ref 135–145)

## 2019-09-08 LAB — APTT
aPTT: 109 seconds — ABNORMAL HIGH (ref 24–36)
aPTT: 89 seconds — ABNORMAL HIGH (ref 24–36)

## 2019-09-08 LAB — HEPARIN LEVEL (UNFRACTIONATED): Heparin Unfractionated: 1.86 IU/mL — ABNORMAL HIGH (ref 0.30–0.70)

## 2019-09-08 LAB — GLUCOSE, CAPILLARY
Glucose-Capillary: 92 mg/dL (ref 70–99)
Glucose-Capillary: 93 mg/dL (ref 70–99)
Glucose-Capillary: 97 mg/dL (ref 70–99)

## 2019-09-08 MED ORDER — SODIUM CHLORIDE 0.9 % IV SOLN
INTRAVENOUS | Status: DC
  Administered 2019-09-08 – 2019-09-09 (×2): via INTRAVENOUS

## 2019-09-08 MED ORDER — BISACODYL 10 MG RE SUPP
10.0000 mg | Freq: Once | RECTAL | Status: AC
  Administered 2019-09-08: 10 mg via RECTAL
  Filled 2019-09-08: qty 1

## 2019-09-08 MED ORDER — PANTOPRAZOLE SODIUM 40 MG PO TBEC
40.0000 mg | DELAYED_RELEASE_TABLET | Freq: Every day | ORAL | Status: DC
  Administered 2019-09-08 – 2019-09-26 (×19): 40 mg via ORAL
  Filled 2019-09-08 (×9): qty 1
  Filled 2019-09-08: qty 2
  Filled 2019-09-08 (×10): qty 1

## 2019-09-08 MED ORDER — LEVOTHYROXINE SODIUM 100 MCG PO TABS
100.0000 ug | ORAL_TABLET | Freq: Every day | ORAL | Status: DC
  Administered 2019-09-08 – 2019-09-26 (×19): 100 ug via ORAL
  Filled 2019-09-08 (×17): qty 1
  Filled 2019-09-08: qty 2
  Filled 2019-09-08: qty 1

## 2019-09-08 NOTE — Progress Notes (Signed)
Physical Therapy Treatment Patient Details Name: Alyssa Odonnell MRN: [id number] DOB: May 13, 1934 Today's Date: 09/08/2019    History of Present Illness Pt is an 85 y.o. female admitted 08/28/19 with worsening epigastric pain. Worked up for large hiatal hernia with gastric outlet obstruction. S/p laparoscopic gastropexy with placement of gastrostomy tube with extensive lysis11/13. PMH includes afib, stroke, HTN, glaucoma.    PT Comments    Patient seen for mobility progression. Pt agreeable to OOB mobility but reports she is unable to ambulate due to painful L toes. Pt requires min A for bed mobility and functional transfers with RW. Continue to progress as tolerated with anticipated d/c to SNF for further skilled PT services.     Follow Up Recommendations  SNF;Supervision for mobility/OOB     Equipment Recommendations  None recommended by PT    Recommendations for Other Services       Precautions / Restrictions Precautions Precautions: Fall Restrictions Weight Bearing Restrictions: No    Mobility  Bed Mobility Overal bed mobility: Needs Assistance Bed Mobility: Supine to Sit     Supine to sit: HOB elevated;Min assist     General bed mobility comments: assist to elevate trunk into sitting   Transfers Overall transfer level: Needs assistance Equipment used: Rolling walker (2 wheeled) Transfers: Sit to/from [facility] Sit to Stand: Min assist Stand pivot transfers: Min assist       General transfer comment: assist required to power up into standing and to steady while pivoting; flexed trunk  Ambulation/Gait             General Gait Details: pt denies ability to ambulate today due to painful L toes   Stairs             Wheelchair Mobility    Modified Rankin (Stroke Patients Only)       Balance Overall balance assessment: Needs assistance   Sitting balance-Leahy Scale: Fair       Standing balance-Leahy Scale: Poor Standing  balance comment: Reliant on UE support                            Cognition Arousal/Alertness: Awake/alert Behavior During Therapy: WFL for tasks assessed/performed Overall Cognitive Status: Within Functional Limits for tasks assessed                                 General Comments: WFL for simple tasks, demonstrates some short-term memory deficits and poor attention, likely baseline cognition      Exercises General Exercises - Lower Extremity Ankle Circles/Pumps: AROM;Both Short Arc Quad: AROM;Both    General Comments General comments (skin integrity, edema, etc.): VSS      Pertinent Vitals/Pain Pain Assessment: Faces Faces Pain Scale: Hurts little more Pain Location: knees and L toes Pain Descriptors / Indicators: Sore;Grimacing;Guarding(audible crepitus in knees when standing) Pain Intervention(s): Limited activity within patient's tolerance;Monitored during session;Repositioned    Home Living                      Prior Function            PT Goals (current goals can now be found in the care plan section) Acute Rehab PT Goals Patient Stated Goal: Agreeable to rehab before return home since daughter cannot assist Progress towards PT goals: Progressing toward goals    Frequency    Min 2X/week  PT Plan Current plan remains appropriate    Co-evaluation              AM-PAC PT "6 Clicks" Mobility   Outcome Measure  Help needed turning from your back to your side while in a flat bed without using bedrails?: A Little Help needed moving from lying on your back to sitting on the side of a flat bed without using bedrails?: A Little Help needed moving to and from a bed to a chair (including a wheelchair)?: A Little Help needed standing up from a chair using your arms (e.g., wheelchair or bedside chair)?: A Little Help needed to walk in hospital room?: A Little Help needed climbing 3-5 steps with a railing? : A Lot 6 Click  Score: 17    End of Session Equipment Utilized During Treatment: Gait belt Activity Tolerance: Patient tolerated treatment well Patient left: with call bell/phone within reach;in chair;with chair alarm set;with family/visitor present Nurse Communication: Mobility status PT Visit Diagnosis: Other abnormalities of gait and mobility (R26.89);Pain     Time: [id number] PT Time Calculation (min) (ACUTE ONLY): 36 min  Charges:  $Gait Training: 8-22 mins $Therapeutic Activity: 8-22 mins                     Earney Navy, PTA Acute Rehabilitation Services Pager: [phone] Office: [phone]     Darliss Cheney 09/08/2019, 4:48 PM

## 2019-09-08 NOTE — Progress Notes (Signed)
ANTICOAGULATION CONSULT NOTE - Follow Up Consult  Pharmacy Consult for apixaban Indication: atrial fibrillation  Allergies  Allergen Reactions  . Aspirin Other (See Comments)    Red splotches  . Atorvastatin Other (See Comments)    Muscle spams  . Nabumetone Itching  . Nsaids     Reaction (??)  . Pravastatin Other (See Comments)    Stiff walking difficulty Stiff walking difficulty  . Procaine Swelling  . Rosuvastatin Nausea Only and Other (See Comments)    Reflux and Muscle stiffness  . Simvastatin Nausea Only and Other (See Comments)    Reflux and Muscle stiffness  . Statins Other (See Comments)  . Evolocumab Rash  . Mobic [Meloxicam] Rash  . Penicillins Rash    Has patient had a PCN reaction causing immediate rash, facial/tongue/throat swelling, SOB or lightheadedness with hypotension: Yes Has patient had a PCN reaction causing severe rash involving mucus membranes or skin necrosis: No Has patient had a PCN reaction that required hospitalization: No Has patient had a PCN reaction occurring within the last 10 years: No If all of the above answers are "NO", then may proceed with Cephalosporin use.      Patient Measurements: Height: 5\' 3"  (160 cm) Weight: 169 lb 8.5 oz (76.9 kg) IBW/kg (Calculated) : 52.4 Heparin Dosing Weight: 69 kg   Vital Signs: Temp: 98.3 F (36.8 C) (11/20 0519) Temp Source: Oral (11/20 0519) BP: 133/68 (11/20 0519) Pulse Rate: 81 (11/20 0519)  Labs: Recent Labs    09/05/19 0854  09/06/19 0430 09/07/19 0540 09/08/19 0654  HGB  --    < > 9.2* 9.5* 9.9*  HCT  --   --  29.4* 30.3* 30.5*  PLT  --   --  219 211 221  APTT  --   --   --   --  109*  HEPARINUNFRC 0.43  --  0.33  --   --   CREATININE  --   --  1.16* 1.26* 1.39*   < > = values in this interval not displayed.    Estimated Creatinine Clearance: 30.1 mL/min (A) (by C-G formula based on SCr of 1.39 mg/dL (H)).   Assessment: 85 yr old female with AFib on Eliquis PTA and was  transitioned to IV heparin on admit. This was converted back to Eliquis after gastrostomy tube placement, but now with concern for LLE ischemia pharmacy asked to transition back to IV heparin with need for lower extremity arteriogram. Pt previously had lower heparin level goal given recent post-op status, but now POD#7 so will utilize full dosing.  Initial aPTT is above goal at 109 sec, heparin level is falsely elevated due to recent DOAC, CBC stable, no bleeding issues overnight.   Goal of Therapy:  Heparin level 0.3-0.7 units/ml aPTT 66-102 sec Monitor platelets by anticoagulation protocol: Yes   Plan: -Reduce heparin to 1350 units/hr -Recheck aPTT in [place], PharmD, BCPS Clinical Pharmacist [phone] Please check AMION for all [place] numbers 09/08/2019

## 2019-09-08 NOTE — Progress Notes (Signed)
ANTICOAGULATION CONSULT NOTE - Follow Up Consult  Pharmacy Consult for apixaban Indication: atrial fibrillation  Allergies  Allergen Reactions  . Aspirin Other (See Comments)    Red splotches  . Atorvastatin Other (See Comments)    Muscle spams  . Nabumetone Itching  . Nsaids     Reaction (??)  . Pravastatin Other (See Comments)    Stiff walking difficulty Stiff walking difficulty  . Procaine Swelling  . Rosuvastatin Nausea Only and Other (See Comments)    Reflux and Muscle stiffness  . Simvastatin Nausea Only and Other (See Comments)    Reflux and Muscle stiffness  . Statins Other (See Comments)  . Evolocumab Rash  . Mobic [Meloxicam] Rash  . Penicillins Rash    Has patient had a PCN reaction causing immediate rash, facial/tongue/throat swelling, SOB or lightheadedness with hypotension: Yes Has patient had a PCN reaction causing severe rash involving mucus membranes or skin necrosis: No Has patient had a PCN reaction that required hospitalization: No Has patient had a PCN reaction occurring within the last 10 years: No If all of the above answers are "NO", then may proceed with Cephalosporin use.      Patient Measurements: Height: 5\' 3"  (160 cm) Weight: 169 lb 8.5 oz (76.9 kg) IBW/kg (Calculated) : 52.4 Heparin Dosing Weight: 69 kg   Vital Signs: Temp: 98.3 F (36.8 C) (11/20 0519) Temp Source: Oral (11/20 1241) BP: 112/70 (11/20 1241) Pulse Rate: 92 (11/20 1241)  Labs: Recent Labs    09/06/19 0430 09/07/19 0540 09/08/19 0654 09/08/19 1610  HGB 9.2* 9.5* 9.9*  --   HCT 29.4* 30.3* 30.5*  --   PLT 219 211 221  --   APTT  --   --  109* 89*  HEPARINUNFRC 0.33  --  1.86*  --   CREATININE 1.16* 1.26* 1.39*  --     Estimated Creatinine Clearance: 30.1 mL/min (A) (by C-G formula based on SCr of 1.39 mg/dL (H)).   Assessment: 85 yr old female with AFib on Eliquis PTA and was transitioned to IV heparin on admit. Then, was converted back to Eliquis after  gastrostomy tube placement, but now with concern for LLE ischemia, pharmacy asked to transition back to IV heparin with need for lower extremity arteriogram. Pt previously had lower heparin level goal given recent post-op status, but now POD#7 so will utilize full dosing.  aPTT ~7 hrs after decreasing heparin infusion to 1350 units/hr was 89 sec, which is within the goal range for this patient. CBC stable. Per RN, no issues with IV or bleeding observed.  Goal of Therapy:  Heparin level 0.3-0.7 units/ml aPTT 66-102 sec Monitor platelets by anticoagulation protocol: Yes   Plan: Continue heparin infusion at 1350 units/hr Check confirmatory aPTT in 8 hrs Monitor daily aPTT, heparin level, CBC Monitor for signs/symptoms of bleeding  Gillermina Hu, PharmD, BCPS, [facility] Clinical Pharmacist 09/08/2019

## 2019-09-08 NOTE — Progress Notes (Signed)
PROGRESS NOTE    Alyssa Odonnell  [id number] DOB: Jun 04, 1934 DOA: 08/28/2019 PCP: Drake Leach, MD   Brief Narrative: 85 year old who ambulate with help of walker, past medical history PAF Eliquis, embolic CVA, hypertension, hyperlipidemia, PAD, cholecystectomy who presented to [facility] ED on 08/28/2019 due to abrupt onset of severe nausea, dry heaving without vomiting, chest epigastric abdominal pain.  CT abdomen showed large hiatal hernia with gastric volvulus with gastric outlet obstruction.  Multiple unsuccessful attempts to pass NG tube at bedside.  IR consulted for NG tube under fluoroscopy.  General surgery consulted and patient was taken to the OR on 11/13 for laparoscopic gastropexy with placement of a 20 French gastrostomy tube with extensive lysis of adhesion.    Assessment & Plan:   Principal Problem:   Volvulus of stomach Active Problems:   PAD (peripheral artery disease) (HCC)   Essential hypertension   PAF (paroxysmal atrial fibrillation) (HCC)   Gastric outlet obstruction   1-Large hiatal hernia with gastric outlet obstruction status post laparoscopic gastropexy with placement of gastrostomy tube with extensive lysis of adhesions. Patient initially presented with leukocytosis, CT abdomen pelvis showed gastric outlet obstruction. General surgery was consulted and patient underwent laparoscopic gastropexy with placement of gastrostomy tube and extensive lysis of adhesions. Tolerating diet, report poor oral intake. No BM since yesterday,  Per general surgery patient will require GT for 6 weeks  2-Left 4, 5 toes pain, cyanosis ; concern for ischemia Patient with a known left SFA occlusion at [place] canal with three-vessel runoff in 2017. ABI; resting right and left ankle brachial index indicates moderate right lower extremity arterial disease. -Vascular has been consulted.  Plan for arteriogram on Monday Eliquis was changed to heparin in anticipation for  arteriogram on Monday   Hypokalemia: Resolved.   -Paroxysmal A. Fib: Patient was transition from heparin Gtt to eliquis 11-18. Plan to transition to oral amiodarone.   -Hypertension; became hypotensive with A. fib RVR on Cardizem. Hydrochlorothiazide and Benicar on hold.  -Hypothyroidism: Continue with Synthroid  -Chronic kidney disease a stage IIIb: Monitor kidney function daily  Anemia of chronic disease: Worsening by recent surgery.  Monitor hemoglobin daily.   elevated TSH; check Free T 3 and T 4.   Estimated body mass index is 30.03 kg/m as calculated from the following:   Height as of this encounter: 5\' 3"  (1.6 m).   Weight as of this encounter: 76.9 kg.   DVT prophylaxis: Eliquis Code Status: Full code Family Communication: Discussed with patient Disposition Plan: She will require skilled nursing facility, social worker consulted Consultants:  Cardiology Surgery  Procedures:  laparoscopic gastropexy with placement of a 33 French gastrostomy tube with extensive lysis of adhesion.11-13    Antimicrobials:    Subjective: Report pain is intermittent, better at times.  Eating small amount of food.   Objective: Vitals:   09/07/19 1700 09/07/19 2132 09/08/19 0519 09/08/19 1241  BP: 134/72 130/69 133/68 112/70  Pulse: 79 84 81 92  Resp: 18   19  Temp: 98.3 F (36.8 C) 98.1 F (36.7 C) 98.3 F (36.8 C)   TempSrc: Oral Oral Oral Oral  SpO2: 100% 93% 97% 96%  Weight:   76.9 kg   Height:        Intake/Output Summary (Last 24 hours) at 09/08/2019 1431 Last data filed at 09/08/2019 1300 Gross per 24 hour  Intake 784.52 ml  Output 1100 ml  Net -315.48 ml   Filed Weights   09/06/19 0534 09/07/19 0628 09/08/19  [id number]  Weight: 77.9 kg 76.8 kg 76.9 kg    Examination:  General exam: NAD Respiratory system: CTA Cardiovascular system: S 1, S 2 RRR Gastrointestinal system: BS present, soft, G tube in place Central nervous system: Non focal  Extremities;  Symmetric power.  Skin: left 4, 5 toes cyanosis, plantar aspect.    Data Reviewed: I have personally reviewed following labs and imaging studies  CBC: Recent Labs  Lab 09/04/19 0502 09/05/19 0332 09/06/19 0430 09/07/19 0540 09/08/19 0654  WBC 9.0 8.4 8.8 7.2 8.5  NEUTROABS 5.9 5.1 4.8 4.0 4.9  HGB 10.4* 9.9* 9.2* 9.5* 9.9*  HCT 32.5* 31.0* 29.4* 30.3* 30.5*  MCV 93.7 92.8 94.8 94.7 92.7  PLT 188 203 219 211 [id number]   Basic Metabolic Panel: Recent Labs  Lab 09/04/19 0502 09/05/19 0332 09/06/19 0430 09/07/19 0540 09/08/19 0654  NA 136 139 139 137 135  K 3.8 3.3* 2.9* 3.9 3.9  CL 103 106 104 102 99  CO2 22 23 25 24 26   GLUCOSE 104* 110* 105* 103* 98  BUN 20 13 11 10 11   CREATININE 1.24* 1.13* 1.16* 1.26* 1.39*  CALCIUM 8.4* 8.3* 8.2* 8.4* 8.5*   GFR: Estimated Creatinine Clearance: 30.1 mL/min (A) (by C-G formula based on SCr of 1.39 mg/dL (H)). Liver Function Tests: No results for input(s): AST, ALT, ALKPHOS, BILITOT, PROT, ALBUMIN in the last 168 hours. No results for input(s): LIPASE, AMYLASE in the last 168 hours. No results for input(s): AMMONIA in the last 168 hours. Coagulation Profile: No results for input(s): INR, PROTIME in the last 168 hours. Cardiac Enzymes: No results for input(s): CKTOTAL, CKMB, CKMBINDEX, TROPONINI in the last 168 hours. BNP (last 3 results) No results for input(s): PROBNP in the last 8760 hours. HbA1C: No results for input(s): HGBA1C in the last 72 hours. CBG: Recent Labs  Lab 09/07/19 1303 09/07/19 1633 09/07/19 2201 09/08/19 0036 09/08/19 0759  GLUCAP 115* 105* 102* 97 93   Lipid Profile: No results for input(s): CHOL, HDL, LDLCALC, TRIG, CHOLHDL, LDLDIRECT in the last 72 hours. Thyroid Function Tests: Recent Labs    09/07/19 0938  TSH 7.370*   Anemia Panel: No results for input(s): VITAMINB12, FOLATE, FERRITIN, TIBC, IRON, RETICCTPCT in the last 72 hours. Sepsis Labs: No results for input(s): PROCALCITON,  LATICACIDVEN in the last 168 hours.  Recent Results (from the past 240 hour(s))  Surgical pcr screen     Status: None   Collection Time: 09/01/19  2:16 PM   Specimen: Nasal Mucosa; Nasal Swab  Result Value Ref Range Status   MRSA, PCR NEGATIVE NEGATIVE Final   Staphylococcus aureus NEGATIVE NEGATIVE Final    Comment: (NOTE) The Xpert SA Assay (FDA approved for NASAL specimens in patients 41 years of age and older), is one component of a comprehensive surveillance program. It is not intended to diagnose infection nor to guide or monitor treatment. Performed at [facility], [place] [address], [place], [place] [address]   SARS CORONAVIRUS 2 (TAT 6-24 HRS) Nasopharyngeal Nasopharyngeal Swab     Status: None   Collection Time: 09/06/19  4:34 PM   Specimen: Nasopharyngeal Swab  Result Value Ref Range Status   SARS Coronavirus 2 NEGATIVE NEGATIVE Final    Comment: (NOTE) SARS-CoV-2 target nucleic acids are NOT DETECTED. The SARS-CoV-2 RNA is generally detectable in upper and lower respiratory specimens during the acute phase of infection. Negative results do not preclude SARS-CoV-2 infection, do not rule out co-infections with other pathogens, and should  not be used as the sole basis for treatment or other patient management decisions. Negative results must be combined with clinical observations, patient history, and epidemiological information. The expected result is Negative. Fact Sheet for Patients: SugarRoll.be Fact Sheet for Healthcare Providers: https://www.woods-mathews.com/ This test is not yet approved or cleared by the [place] FDA and  has been authorized for detection and/or diagnosis of SARS-CoV-2 by FDA under an Emergency Use Authorization (EUA). This EUA will remain  in effect (meaning this test can be used) for the duration of the COVID-19 declaration under Section 56 4(b)(1) of the Act, 21 U.S.C. section  360bbb-3(b)(1), unless the authorization is terminated or revoked sooner. Performed at [facility], [place] [address], [place], [place] [address]          Radiology Studies: Vas [place] Abi With/wo Tbi  Result Date: 09/07/2019 LOWER EXTREMITY DOPPLER STUDY Indications: Discoloration and pain. High Risk Factors: Hypertension, hyperlipidemia, prior CVA.  Comparison Study: 11/04/17 previous Performing Technologist: Abram Sander RVS  Examination Guidelines: A complete evaluation includes at minimum, Doppler waveform signals and systolic blood pressure reading at the level of bilateral brachial, anterior tibial, and posterior tibial arteries, when vessel segments are accessible. Bilateral testing is considered an integral part of a complete examination. Photoelectric Plethysmograph (PPG) waveforms and toe systolic pressure readings are included as required and additional duplex testing as needed. Limited examinations for reoccurring indications may be performed as noted.  ABI Findings: +--------+------------------+-----+---------+--------+  Right    Rt Pressure (mmHg) Index Waveform  Comment   +--------+------------------+-----+---------+--------+  Brachial 137                      triphasic           +--------+------------------+-----+---------+--------+  PTA      78                 0.57  biphasic            +--------+------------------+-----+---------+--------+  DP       82                 0.60  biphasic            +--------+------------------+-----+---------+--------+ +--------+------------------+-----+--------+--------------------+  Left     Lt Pressure (mmHg) Index Waveform Comment               +--------+------------------+-----+--------+--------------------+  Brachial                          biphasic restricted extremity  +--------+------------------+-----+--------+--------------------+  PTA      70                 0.51  biphasic                        +--------+------------------+-----+--------+--------------------+  DP       61                 0.45  biphasic                       +--------+------------------+-----+--------+--------------------+ +-------+-----------+-----------+------------+------------+  ABI/TBI Today's ABI Today's TBI Previous ABI Previous TBI  +-------+-----------+-----------+------------+------------+  Right   0.60                                               +-------+-----------+-----------+------------+------------+  Left    0.51                                               +-------+-----------+-----------+------------+------------+  Summary: Right: Resting right ankle-brachial index indicates moderate right lower extremity arterial disease. Left: Resting left ankle-brachial index indicates moderate left lower extremity arterial disease.  *See table(s) above for measurements and observations.  Electronically signed by Monica Martinez MD on 09/07/2019 at 7:15:20 PM.   Final         Scheduled Meds:  amiodarone  200 mg Oral Daily   chlorhexidine  15 mL Mouth Rinse BID   docusate sodium  100 mg Oral Daily   feeding supplement (ENSURE ENLIVE)  237 mL Oral BID BM   latanoprost  1 drop Both Eyes QHS   levothyroxine  100 mcg Oral Q0600   mouth rinse  15 mL Mouth Rinse q12n4p   pantoprazole  40 mg Oral Daily   polyethylene glycol  17 g Oral Daily   sodium chloride flush  10-40 mL Intracatheter Q12H   Continuous Infusions:  dextrose 5 % and 0.45% NaCl 15 mL/hr at 09/05/19 1712   heparin 1,350 Units/hr (09/08/19 0915)     LOS: 10 days    Time spent: 35 minutes.     Elmarie Shiley, MD [facility]   If 7PM-7AM, please contact night-coverage [email or web address] Password [email or web address] 09/08/2019, 2:31 PM

## 2019-09-08 NOTE — Care Management Important Message (Signed)
Important Message  Patient Details  Name: Alyssa Odonnell MRN: [id number] Date of Birth: Aug 30, 1934   Medicare Important Message Given:  Yes     [name] 09/08/2019, 1:51 PM

## 2019-09-08 NOTE — Progress Notes (Signed)
[facility]  Subjective  -states her left third through fifth toes are still hurting.   Objective 133/68 81 98.3 F (36.8 C) (Oral) 18 97%  Intake/Output Summary (Last 24 hours) at 09/08/2019 1134 Last data filed at 09/08/2019 0600 Gross per 24 hour  Intake 192.52 ml  Output 1450 ml  Net -1257.48 ml    Left DP PT signal. Discoloration on bottom of the left fifth toe slightly improved.  Laboratory Lab Results: Recent Labs    09/07/19 0540 09/08/19 0654  WBC 7.2 8.5  HGB 9.5* 9.9*  HCT 30.3* 30.5*  PLT 211 221   BMET Recent Labs    09/07/19 0540 09/08/19 0654  NA 137 135  K 3.9 3.9  CL 102 99  CO2 24 26  GLUCOSE 103* 98  BUN 10 11  CREATININE 1.26* 1.39*  CALCIUM 8.4* 8.5*    COAG Lab Results  Component Value Date   INR 0.98 03/09/2017   No results found for: PTT  Assessment/Planning:  85 year old female with history of CVA, paroxysmal A. fib on Eliquis, PAD with previous right SFA intervention by Dr. Scot Dock in 0000000 for atheroembolic event that vascular surgey has been consulted for left toe 3 through 5 pain with some discoloration.  Her ABIs on the left reveal ABI of 0.51 with biphasic waveforms at the ankles.  Her left 5th toe does look cyanotic on the base.  No overt tissue loss. Known left SFA occlusion at [place]'s canal with three-vessel runoff in 2017.    Plan is for left lower extremity arteriogram on Monday with Dr. Donzetta Matters.  Please hold the patient's Eliquis.  She will need gentle hydration for kidney function.   Marty Heck 09/08/2019 11:34 AM --

## 2019-09-09 LAB — CBC WITH DIFFERENTIAL/PLATELET
Abs Immature Granulocytes: 0.27 10*3/uL — ABNORMAL HIGH (ref 0.00–0.07)
Basophils Absolute: 0 10*3/uL (ref 0.0–0.1)
Basophils Relative: 0 %
Eosinophils Absolute: 0.3 10*3/uL (ref 0.0–0.5)
Eosinophils Relative: 3 %
HCT: 28.9 % — ABNORMAL LOW (ref 36.0–46.0)
Hemoglobin: 9.2 g/dL — ABNORMAL LOW (ref 12.0–15.0)
Immature Granulocytes: 3 %
Lymphocytes Relative: 23 %
Lymphs Abs: 2.1 10*3/uL (ref 0.7–4.0)
MCH: 29.8 pg (ref 26.0–34.0)
MCHC: 31.8 g/dL (ref 30.0–36.0)
MCV: 93.5 fL (ref 80.0–100.0)
Monocytes Absolute: 0.9 10*3/uL (ref 0.1–1.0)
Monocytes Relative: 10 %
Neutro Abs: 5.5 10*3/uL (ref 1.7–7.7)
Neutrophils Relative %: 61 %
Platelets: 223 10*3/uL (ref 150–400)
RBC: 3.09 MIL/uL — ABNORMAL LOW (ref 3.87–5.11)
RDW: 13.2 % (ref 11.5–15.5)
WBC: 9.1 10*3/uL (ref 4.0–10.5)
nRBC: 0 % (ref 0.0–0.2)

## 2019-09-09 LAB — GLUCOSE, CAPILLARY
Glucose-Capillary: 120 mg/dL — ABNORMAL HIGH (ref 70–99)
Glucose-Capillary: 85 mg/dL (ref 70–99)
Glucose-Capillary: 89 mg/dL (ref 70–99)
Glucose-Capillary: 95 mg/dL (ref 70–99)

## 2019-09-09 LAB — APTT
aPTT: 80 seconds — ABNORMAL HIGH (ref 24–36)
aPTT: 55 seconds — ABNORMAL HIGH (ref 24–36)

## 2019-09-09 LAB — BASIC METABOLIC PANEL
Anion gap: 11 (ref 5–15)
BUN: 13 mg/dL (ref 8–23)
CO2: 26 mmol/L (ref 22–32)
Calcium: 8.6 mg/dL — ABNORMAL LOW (ref 8.9–10.3)
Chloride: 99 mmol/L (ref 98–111)
Creatinine, Ser: 1.27 mg/dL — ABNORMAL HIGH (ref 0.44–1.00)
GFR calc Af Amer: 45 mL/min — ABNORMAL LOW (ref >60)
GFR calc non Af Amer: 39 mL/min — ABNORMAL LOW (ref >60)
Glucose, Bld: 96 mg/dL (ref 70–99)
Potassium: 3.9 mmol/L (ref 3.5–5.1)
Sodium: 136 mmol/L (ref 135–145)

## 2019-09-09 LAB — HEPARIN LEVEL (UNFRACTIONATED)
Heparin Unfractionated: 1.02 IU/mL — ABNORMAL HIGH (ref 0.30–0.70)
Heparin Unfractionated: 0.78 IU/mL — ABNORMAL HIGH (ref 0.30–0.70)

## 2019-09-09 LAB — T4, FREE: Free T4: 1.35 ng/dL — ABNORMAL HIGH (ref 0.61–1.12)

## 2019-09-09 MED ORDER — POLYETHYLENE GLYCOL 3350 17 G PO PACK
17.0000 g | PACK | Freq: Two times a day (BID) | ORAL | Status: DC
  Administered 2019-09-09 – 2019-09-25 (×17): 17 g via ORAL
  Filled 2019-09-09 (×27): qty 1

## 2019-09-09 MED ORDER — BISACODYL 10 MG RE SUPP
10.0000 mg | Freq: Once | RECTAL | Status: AC
  Administered 2019-09-09: 10 mg via RECTAL
  Filled 2019-09-09: qty 1

## 2019-09-09 NOTE — Progress Notes (Signed)
PROGRESS NOTE    Alyssa Odonnell  [id number] DOB: 1934/03/31 DOA: 08/28/2019 PCP: Drake Leach, MD   Brief Narrative: 85 year old who ambulate with help of walker, past medical history PAF Eliquis, embolic CVA, hypertension, hyperlipidemia, PAD, cholecystectomy who presented to [facility] ED on 08/28/2019 due to abrupt onset of severe nausea, dry heaving without vomiting, chest epigastric abdominal pain.  CT abdomen showed large hiatal hernia with gastric volvulus with gastric outlet obstruction.  Multiple unsuccessful attempts to pass NG tube at bedside.  IR consulted for NG tube under fluoroscopy.  General surgery consulted and patient was taken to the OR on 11/13 for laparoscopic gastropexy with placement of a 20 French gastrostomy tube with extensive lysis of adhesion.    Assessment & Plan:   Principal Problem:   Volvulus of stomach Active Problems:   PAD (peripheral artery disease) (HCC)   Essential hypertension   PAF (paroxysmal atrial fibrillation) (HCC)   Gastric outlet obstruction   1-Large hiatal hernia with gastric outlet obstruction status post laparoscopic gastropexy with placement of gastrostomy tube with extensive lysis of adhesions. Patient initially presented with leukocytosis, CT abdomen pelvis showed gastric outlet obstruction. General surgery was consulted and patient underwent laparoscopic gastropexy with placement of gastrostomy tube and extensive lysis of adhesions. Tolerating diet, report poor oral intake. Per general surgery patient will require GT for 6 weeks. Had very small BM yesterday. Will try suppository and increase miralax.   2-Left 4, 5 toes pain, cyanosis ; concern for ischemia Patient with a known left SFA occlusion at [place] with three-vessel runoff in 2017. ABI; resting right and left ankle brachial index indicates moderate right lower extremity arterial disease. -Vascular has been consulted.  Plan for arteriogram on Monday Eliquis  was changed to heparin in anticipation for arteriogram on Monday Will start hydration tomorrow  Hypokalemia: Resolved.   -Paroxysmal A. Fib: Patient was transition from heparin Gtt to eliquis 11-18. Plan to transition to oral amiodarone.   -Hypertension; became hypotensive with A. fib RVR on Cardizem. Hydrochlorothiazide and Benicar on hold.  -Hypothyroidism: Continue with Synthroid  -Chronic kidney disease a stage IIIb: Monitor kidney function daily  Anemia of chronic disease: Worsening by recent surgery.  Monitor hemoglobin daily.   elevated TSH; check Free T 3 and T 4.   Estimated body mass index is 29.88 kg/m as calculated from the following:   Height as of this encounter: 5\' 3"  (1.6 m).   Weight as of this encounter: 76.5 kg.   DVT prophylaxis: Eliquis Code Status: Full code Family Communication: Discussed with patient Disposition Plan: She will require skilled nursing facility, social worker consulted Consultants:  Cardiology Surgery  Procedures:  laparoscopic gastropexy with placement of a 17 French gastrostomy tube with extensive lysis of adhesion.11-13    Antimicrobials:    Subjective: She is not having a severe toes pain as before She denies shortness of breath, chest pain. She eating a small amount.  She had a very small bowel movement yesterday.  She feels that she needs another suppository today.  Objective: Vitals:   09/08/19 1241 09/08/19 1242 09/08/19 2052 09/09/19 0615  BP: 112/70  106/75 119/65  Pulse: 92  85 75  Resp: 19  20 13   Temp:  98.2 F (36.8 C) 98.5 F (36.9 C) 98 F (36.7 C)  TempSrc: Oral  Oral Oral  SpO2: 96%  94% 100%  Weight:    76.5 kg  Height:        Intake/Output Summary (Last 24 hours)  at 09/09/2019 0813 Last data filed at 09/09/2019 0617 Gross per 24 hour  Intake 958.3 ml  Output 900 ml  Net 58.3 ml   Filed Weights   09/07/19 0628 09/08/19 0519 09/09/19 0615  Weight: 76.8 kg 76.9 kg 76.5 kg    Examination:   General exam: NAD Respiratory system: CTA Cardiovascular system: S1, S2 regular rhythm and rate Gastrointestinal system: Bowel sounds present, soft mild tenderness G-tube in place Central nervous system: Nonfocal Extremities; metric power Skin: left 4, 5 toes cyanosis, plantar aspect.    Data Reviewed: I have personally reviewed following labs and imaging studies  CBC: Recent Labs  Lab 09/05/19 0332 09/06/19 0430 09/07/19 0540 09/08/19 0654 09/09/19 0320  WBC 8.4 8.8 7.2 8.5 9.1  NEUTROABS 5.1 4.8 4.0 4.9 5.5  HGB 9.9* 9.2* 9.5* 9.9* 9.2*  HCT 31.0* 29.4* 30.3* 30.5* 28.9*  MCV 92.8 94.8 94.7 92.7 93.5  PLT 203 219 211 221 [id number]   Basic Metabolic Panel: Recent Labs  Lab 09/05/19 0332 09/06/19 0430 09/07/19 0540 09/08/19 0654 09/09/19 0320  NA 139 139 137 135 136  K 3.3* 2.9* 3.9 3.9 3.9  CL 106 104 102 99 99  CO2 23 25 24 26 26   GLUCOSE 110* 105* 103* 98 96  BUN 13 11 10 11 13   CREATININE 1.13* 1.16* 1.26* 1.39* 1.27*  CALCIUM 8.3* 8.2* 8.4* 8.5* 8.6*   GFR: Estimated Creatinine Clearance: 32.9 mL/min (A) (by C-G formula based on SCr of 1.27 mg/dL (H)). Liver Function Tests: No results for input(s): AST, ALT, ALKPHOS, BILITOT, PROT, ALBUMIN in the last 168 hours. No results for input(s): LIPASE, AMYLASE in the last 168 hours. No results for input(s): AMMONIA in the last 168 hours. Coagulation Profile: No results for input(s): INR, PROTIME in the last 168 hours. Cardiac Enzymes: No results for input(s): CKTOTAL, CKMB, CKMBINDEX, TROPONINI in the last 168 hours. BNP (last 3 results) No results for input(s): PROBNP in the last 8760 hours. HbA1C: No results for input(s): HGBA1C in the last 72 hours. CBG: Recent Labs  Lab 09/07/19 2201 09/08/19 0036 09/08/19 0759 09/08/19 1628 09/09/19 0000  GLUCAP 102* 97 93 92 89   Lipid Profile: No results for input(s): CHOL, HDL, LDLCALC, TRIG, CHOLHDL, LDLDIRECT in the last 72 hours. Thyroid Function Tests:  Recent Labs    09/07/19 0938 09/09/19 0320  TSH 7.370*  --   FREET4  --  1.35*   Anemia Panel: No results for input(s): VITAMINB12, FOLATE, FERRITIN, TIBC, IRON, RETICCTPCT in the last 72 hours. Sepsis Labs: No results for input(s): PROCALCITON, LATICACIDVEN in the last 168 hours.  Recent Results (from the past 240 hour(s))  Surgical pcr screen     Status: None   Collection Time: 09/01/19  2:16 PM   Specimen: Nasal Mucosa; Nasal Swab  Result Value Ref Range Status   MRSA, PCR NEGATIVE NEGATIVE Final   Staphylococcus aureus NEGATIVE NEGATIVE Final    Comment: (NOTE) The Xpert SA Assay (FDA approved for NASAL specimens in patients 25 years of age and older), is one component of a comprehensive surveillance program. It is not intended to diagnose infection nor to guide or monitor treatment. Performed at [facility], [place] [address] [place], [place] [address]   SARS CORONAVIRUS 2 (TAT 6-24 HRS) Nasopharyngeal Nasopharyngeal Swab     Status: None   Collection Time: 09/06/19  4:34 PM   Specimen: Nasopharyngeal Swab  Result Value Ref Range Status   SARS Coronavirus 2 NEGATIVE NEGATIVE Final  Comment: (NOTE) SARS-CoV-2 target nucleic acids are NOT DETECTED. The SARS-CoV-2 RNA is generally detectable in upper and lower respiratory specimens during the acute phase of infection. Negative results do not preclude SARS-CoV-2 infection, do not rule out co-infections with other pathogens, and should not be used as the sole basis for treatment or other patient management decisions. Negative results must be combined with clinical observations, patient history, and epidemiological information. The expected result is Negative. Fact Sheet for Patients: SugarRoll.be Fact Sheet for Healthcare Providers: https://www.woods-mathews.com/ This test is not yet approved or cleared by the [place] FDA and  has been authorized for detection and/or  diagnosis of SARS-CoV-2 by FDA under an Emergency Use Authorization (EUA). This EUA will remain  in effect (meaning this test can be used) for the duration of the COVID-19 declaration under Section 56 4(b)(1) of the Act, 21 U.S.C. section 360bbb-3(b)(1), unless the authorization is terminated or revoked sooner. Performed at [facility], [place] [address], [place], [place] [address]          Radiology Studies: Vas [place] Abi With/wo Tbi  Result Date: 09/07/2019 LOWER EXTREMITY DOPPLER STUDY Indications: Discoloration and pain. High Risk Factors: Hypertension, hyperlipidemia, prior CVA.  Comparison Study: 11/04/17 previous Performing Technologist: Abram Sander RVS  Examination Guidelines: A complete evaluation includes at minimum, Doppler waveform signals and systolic blood pressure reading at the level of bilateral brachial, anterior tibial, and posterior tibial arteries, when vessel segments are accessible. Bilateral testing is considered an integral part of a complete examination. Photoelectric Plethysmograph (PPG) waveforms and toe systolic pressure readings are included as required and additional duplex testing as needed. Limited examinations for reoccurring indications may be performed as noted.  ABI Findings: +--------+------------------+-----+---------+--------+ Right   Rt Pressure (mmHg)IndexWaveform Comment  +--------+------------------+-----+---------+--------+ [id number]                    triphasic         +--------+------------------+-----+---------+--------+ PTA     78                0.57 biphasic          +--------+------------------+-----+---------+--------+ DP      82                0.60 biphasic          +--------+------------------+-----+---------+--------+ +--------+------------------+-----+--------+--------------------+ Left    Lt Pressure (mmHg)IndexWaveformComment              +--------+------------------+-----+--------+--------------------+  Brachial                       biphasicrestricted extremity +--------+------------------+-----+--------+--------------------+ PTA     70                0.51 biphasic                     +--------+------------------+-----+--------+--------------------+ DP      61                0.45 biphasic                     +--------+------------------+-----+--------+--------------------+ +-------+-----------+-----------+------------+------------+ ABI/TBIToday's ABIToday's TBIPrevious ABIPrevious TBI +-------+-----------+-----------+------------+------------+ Right  0.60                                           +-------+-----------+-----------+------------+------------+ Left   0.51                                           +-------+-----------+-----------+------------+------------+  Summary: Right: Resting right ankle-brachial index indicates moderate right lower extremity arterial disease. Left: Resting left ankle-brachial index indicates moderate left lower extremity arterial disease.  *See table(s) above for measurements and observations.  Electronically signed by Monica Martinez MD on 09/07/2019 at 7:15:20 PM.   Final         Scheduled Meds: . amiodarone  200 mg Oral Daily  . bisacodyl  10 mg Rectal Once  . chlorhexidine  15 mL Mouth Rinse BID  . docusate sodium  100 mg Oral Daily  . feeding supplement (ENSURE ENLIVE)  237 mL Oral BID BM  . latanoprost  1 drop Both Eyes QHS  . levothyroxine  100 mcg Oral Q0600  . mouth rinse  15 mL Mouth Rinse q12n4p  . pantoprazole  40 mg Oral Daily  . polyethylene glycol  17 g Oral BID  . sodium chloride flush  10-40 mL Intracatheter Q12H   Continuous Infusions: . dextrose 5 % and 0.45% NaCl 15 mL/hr at 09/05/19 1712  . heparin 1,350 Units/hr (09/09/19 0602)     LOS: 11 days    Time spent: 35 minutes.     Elmarie Shiley, MD [facility]   If 7PM-7AM, please contact night-coverage [email or web address] Password  [facility] 09/09/2019, 8:13 AM

## 2019-09-09 NOTE — Progress Notes (Signed)
[place] for Heparin Indication: atrial fibrillation  Allergies  Allergen Reactions  . Aspirin Other (See Comments)    Red splotches  . Atorvastatin Other (See Comments)    Muscle spams  . Nabumetone Itching  . Nsaids     Reaction (??)  . Pravastatin Other (See Comments)    Stiff walking difficulty Stiff walking difficulty  . Procaine Swelling  . Rosuvastatin Nausea Only and Other (See Comments)    Reflux and Muscle stiffness  . Simvastatin Nausea Only and Other (See Comments)    Reflux and Muscle stiffness  . Statins Other (See Comments)  . Evolocumab Rash  . Mobic [Meloxicam] Rash  . Penicillins Rash    Has patient had a PCN reaction causing immediate rash, facial/tongue/throat swelling, SOB or lightheadedness with hypotension: Yes Has patient had a PCN reaction causing severe rash involving mucus membranes or skin necrosis: No Has patient had a PCN reaction that required hospitalization: No Has patient had a PCN reaction occurring within the last 10 years: No If all of the above answers are "NO", then may proceed with Cephalosporin use.      Patient Measurements: Height: 5\' 3"  (160 cm) Weight: 168 lb 10.4 oz (76.5 kg) IBW/kg (Calculated) : 52.4 Heparin Dosing Weight: 69 kg   Vital Signs: Temp: 98 F (36.7 C) (11/21 1519) Temp Source: Oral (11/21 1519) BP: 125/70 (11/21 1519) Pulse Rate: 89 (11/21 1519)  Labs: Recent Labs    09/07/19 0540  09/08/19 0654 09/08/19 1610 09/09/19 0320 09/09/19 1633 09/09/19 1635  HGB 9.5*  --  9.9*  --  9.2*  --   --   HCT 30.3*  --  30.5*  --  28.9*  --   --   PLT 211  --  221  --  223  --   --   APTT  --    < > 109* 89* 80* 55*  --   HEPARINUNFRC  --   --  1.86*  --  1.02*  --  0.78*  CREATININE 1.26*  --  1.39*  --  1.27*  --   --    < > = values in this interval not displayed.    Estimated Creatinine Clearance: 32.9 mL/min (A) (by C-G formula based on SCr of 1.27 mg/dL  (H)).   Assessment: 77 YOF with AFib on Eliquis PTA and was transitioned to IV heparin on admit. Then, was converted back to Eliquis after gastrostomy tube placement, but now with concern for LLE ischemia, pharmacy asked to transition back to IV heparin with need for lower extremity arteriogram. Restarted on full dose heparin POD-7.  Heparin level is supra-therapeutic but aPTT is low.  Utilizing aPTT to guide heparin dosing.  No issue with infusion nor bleeding per RN.   Goal of Therapy:  Heparin level 0.3-0.7 units/ml aPTT 66-102 sec Monitor platelets by anticoagulation protocol: Yes  Plan: Increase heparin infusion back to 1350 units/hr F/U AM labs  Riyaan Heroux D. Mina Marble, PharmD, BCPS, [place] 09/09/2019, 6:03 PM

## 2019-09-09 NOTE — Progress Notes (Signed)
ANTICOAGULATION CONSULT NOTE - Follow Up Consult  Pharmacy Consult for Heparin Indication: atrial fibrillation  Allergies  Allergen Reactions  . Aspirin Other (See Comments)    Red splotches  . Atorvastatin Other (See Comments)    Muscle spams  . Nabumetone Itching  . Nsaids     Reaction (??)  . Pravastatin Other (See Comments)    Stiff walking difficulty Stiff walking difficulty  . Procaine Swelling  . Rosuvastatin Nausea Only and Other (See Comments)    Reflux and Muscle stiffness  . Simvastatin Nausea Only and Other (See Comments)    Reflux and Muscle stiffness  . Statins Other (See Comments)  . Evolocumab Rash  . Mobic [Meloxicam] Rash  . Penicillins Rash    Has patient had a PCN reaction causing immediate rash, facial/tongue/throat swelling, SOB or lightheadedness with hypotension: Yes Has patient had a PCN reaction causing severe rash involving mucus membranes or skin necrosis: No Has patient had a PCN reaction that required hospitalization: No Has patient had a PCN reaction occurring within the last 10 years: No If all of the above answers are "NO", then may proceed with Cephalosporin use.      Patient Measurements: Height: 5\' 3"  (160 cm) Weight: 168 lb 10.4 oz (76.5 kg) IBW/kg (Calculated) : 52.4 Heparin Dosing Weight: 69 kg   Vital Signs: Temp: 98 F (36.7 C) (11/21 0615) Temp Source: Oral (11/21 0615) BP: 119/65 (11/21 0615) Pulse Rate: 75 (11/21 0615)  Labs: Recent Labs    09/07/19 0540 09/08/19 0654 09/08/19 1610 09/09/19 0320  HGB 9.5* 9.9*  --  9.2*  HCT 30.3* 30.5*  --  28.9*  PLT 211 221  --  223  APTT  --  109* 89* 80*  HEPARINUNFRC  --  1.86*  --  1.02*  CREATININE 1.26* 1.39*  --  1.27*    Estimated Creatinine Clearance: 32.9 mL/min (A) (by C-G formula based on SCr of 1.27 mg/dL (H)).   Assessment: 85 yr old female with AFib on Eliquis PTA and was transitioned to IV heparin on admit. Then, was converted back to Eliquis after  gastrostomy tube placement, but now with concern for LLE ischemia, pharmacy asked to transition back to IV heparin with need for lower extremity arteriogram. Restarted on full dose heparin POD-7.  Heparin level and aPTT correlating; heparin supratherapeutic this morning at 1.02. H&H low stable, no signs/symptoms of bleed reported.  Goal of Therapy:  Heparin level 0.3-0.7 units/ml aPTT 66-102 sec Monitor platelets by anticoagulation protocol: Yes  Plan: Decrease heparin to 1200 units/hour 1700 HL Monitor daily aPTT, heparin level, CBC Monitor for signs/symptoms of bleeding     Thank you for allowing pharmacy to participate in this patient's care.  Kiev Labrosse L. Devin Going, [place] PGY1 Pharmacy Resident [phone] 09/09/19      7:42 AM  Please check AMION for all [facility] phone numbers After 10:00 PM, call the [facility] [phone]

## 2019-09-10 LAB — APTT: aPTT: 93 seconds — ABNORMAL HIGH (ref 24–36)

## 2019-09-10 LAB — CBC WITH DIFFERENTIAL/PLATELET
Abs Immature Granulocytes: 0.22 10*3/uL — ABNORMAL HIGH (ref 0.00–0.07)
Basophils Absolute: 0 10*3/uL (ref 0.0–0.1)
Basophils Relative: 0 %
Eosinophils Absolute: 0.2 10*3/uL (ref 0.0–0.5)
Eosinophils Relative: 3 %
HCT: 28.5 % — ABNORMAL LOW (ref 36.0–46.0)
Hemoglobin: 8.9 g/dL — ABNORMAL LOW (ref 12.0–15.0)
Immature Granulocytes: 2 %
Lymphocytes Relative: 24 %
Lymphs Abs: 2.2 10*3/uL (ref 0.7–4.0)
MCH: 29.5 pg (ref 26.0–34.0)
MCHC: 31.2 g/dL (ref 30.0–36.0)
MCV: 94.4 fL (ref 80.0–100.0)
Monocytes Absolute: 0.8 10*3/uL (ref 0.1–1.0)
Monocytes Relative: 9 %
Neutro Abs: 5.7 10*3/uL (ref 1.7–7.7)
Neutrophils Relative %: 62 %
Platelets: 262 10*3/uL (ref 150–400)
RBC: 3.02 MIL/uL — ABNORMAL LOW (ref 3.87–5.11)
RDW: 13.2 % (ref 11.5–15.5)
WBC: 9.1 10*3/uL (ref 4.0–10.5)
nRBC: 0 % (ref 0.0–0.2)

## 2019-09-10 LAB — BASIC METABOLIC PANEL
Anion gap: 9 (ref 5–15)
BUN: 16 mg/dL (ref 8–23)
CO2: 26 mmol/L (ref 22–32)
Calcium: 8.6 mg/dL — ABNORMAL LOW (ref 8.9–10.3)
Chloride: 102 mmol/L (ref 98–111)
Creatinine, Ser: 1.3 mg/dL — ABNORMAL HIGH (ref 0.44–1.00)
GFR calc Af Amer: 44 mL/min — ABNORMAL LOW (ref >60)
GFR calc non Af Amer: 38 mL/min — ABNORMAL LOW (ref >60)
Glucose, Bld: 105 mg/dL — ABNORMAL HIGH (ref 70–99)
Potassium: 4 mmol/L (ref 3.5–5.1)
Sodium: 137 mmol/L (ref 135–145)

## 2019-09-10 LAB — GLUCOSE, CAPILLARY
Glucose-Capillary: 88 mg/dL (ref 70–99)
Glucose-Capillary: 107 mg/dL — ABNORMAL HIGH (ref 70–99)

## 2019-09-10 LAB — HEPARIN LEVEL (UNFRACTIONATED): Heparin Unfractionated: 0.81 IU/mL — ABNORMAL HIGH (ref 0.30–0.70)

## 2019-09-10 LAB — T3, FREE: T3, Free: 1.5 pg/mL — ABNORMAL LOW (ref 2.0–4.4)

## 2019-09-10 MED ORDER — SODIUM CHLORIDE 0.9 % IV SOLN
INTRAVENOUS | Status: DC
  Administered 2019-09-10 – 2019-09-11 (×3): via INTRAVENOUS

## 2019-09-10 NOTE — Progress Notes (Signed)
PROGRESS NOTE    Alyssa Odonnell  [id number] DOB: 08-04-1934 DOA: 08/28/2019 PCP: Drake Leach, MD   Brief Narrative: 85 year old who ambulate with help of walker, past medical history PAF Eliquis, embolic CVA, hypertension, hyperlipidemia, PAD, cholecystectomy who presented to [facility] ED on 08/28/2019 due to abrupt onset of severe nausea, dry heaving without vomiting, chest epigastric abdominal pain.  CT abdomen showed large hiatal hernia with gastric volvulus with gastric outlet obstruction.  Multiple unsuccessful attempts to pass NG tube at bedside.  IR consulted for NG tube under fluoroscopy.  General surgery consulted and patient was taken to the OR on 11/13 for laparoscopic gastropexy with placement of a 20 French gastrostomy tube with extensive lysis of adhesion.    Assessment & Plan:   Principal Problem:   Volvulus of stomach Active Problems:   PAD (peripheral artery disease) (HCC)   Essential hypertension   PAF (paroxysmal atrial fibrillation) (HCC)   Gastric outlet obstruction   1-Large hiatal hernia with gastric outlet obstruction status post laparoscopic gastropexy with placement of gastrostomy tube with extensive lysis of adhesions. Patient initially presented with leukocytosis, CT abdomen pelvis showed gastric outlet obstruction. General surgery was consulted and patient underwent laparoscopic gastropexy with placement of gastrostomy tube and extensive lysis of adhesions. Per general surgery patient will require GT for 6 weeks. Had moderate size BM yesterday. Would continue with miralax.  Nurse report drainage from G tube, she will inform surgical team.  Tolerating diet, poor oral intake.   2-Left 4, 5 toes pain, cyanosis ; concern for ischemia Patient with a known left SFA occlusion at [place] with three-vessel runoff in 2017. ABI; resting right and left ankle brachial index indicates moderate right lower extremity arterial disease. -Vascular has been  consulted.  Plan for arteriogram on Monday -Eliquis was changed to heparin in anticipation for arteriogram on Monday -Start IV fluids.   Hypokalemia: Resolved.   -Paroxysmal A. Fib: Patient was transition from heparin Gtt to eliquis 11-18. Plan to transition to oral amiodarone.   -Hypertension; became hypotensive with A. fib RVR on Cardizem. Hydrochlorothiazide and Benicar on hold.  -Hypothyroidism: Continue with Synthroid  -Chronic kidney disease a stage IIIb: Monitor kidney function daily  Anemia of chronic disease: Worsening by recent surgery.  Monitor hemoglobin daily.   elevated TSH; - Free T 3  Low, and T 4 high. She will need repeat thyroid function test in 4 weeks.  .   Estimated body mass index is 28.94 kg/m as calculated from the following:   Height as of this encounter: 5\' 3"  (1.6 m).   Weight as of this encounter: 74.1 kg.   DVT prophylaxis: Eliquis Code Status: Full code Family Communication: Discussed with patient Disposition Plan: She will require skilled nursing facility, social worker consulted Consultants:  Cardiology Surgery  Procedures:  laparoscopic gastropexy with placement of a 33 French gastrostomy tube with extensive lysis of adhesion.11-13    Antimicrobials:    Subjective: She had moderate size BM yesterday. Poor oral intake. Report Left foot pain intermittently.  Denies abdominal pain  Objective: Vitals:   09/09/19 2206 09/10/19 0510 09/10/19 0512 09/10/19 0815  BP: (!) 81/69 112/68 (!) 112/54 119/69  Pulse: 91 82    Resp: (!) 22     Temp: 98 F (36.7 C) 98.4 F (36.9 C) 98.4 F (36.9 C)   TempSrc: Oral Oral Oral   SpO2: 97% 99%    Weight:   74.1 kg   Height:  Intake/Output Summary (Last 24 hours) at 09/10/2019 0834 Last data filed at 09/09/2019 1700 Gross per 24 hour  Intake -  Output 1025 ml  Net -1025 ml   Filed Weights   09/08/19 0519 09/09/19 0615 09/10/19 0512  Weight: 76.9 kg 76.5 kg 74.1 kg     Examination:  General exam: NAD Respiratory system: CTA Cardiovascular system: S 1, S 2 RRR Gastrointestinal system: BS present, soft, G tube in lace Central nervous system: Non focal.  Extremities; Symmetric power.  Skin: Left toes 4, 5 with cyanosis at bases.    Data Reviewed: I have personally reviewed following labs and imaging studies  CBC: Recent Labs  Lab 09/06/19 0430 09/07/19 0540 09/08/19 0654 09/09/19 0320 09/10/19 0315  WBC 8.8 7.2 8.5 9.1 9.1  NEUTROABS 4.8 4.0 4.9 5.5 5.7  HGB 9.2* 9.5* 9.9* 9.2* 8.9*  HCT 29.4* 30.3* 30.5* 28.9* 28.5*  MCV 94.8 94.7 92.7 93.5 94.4  PLT 219 211 221 223 [id number]   Basic Metabolic Panel: Recent Labs  Lab 09/06/19 0430 09/07/19 0540 09/08/19 0654 09/09/19 0320 09/10/19 0315  NA 139 137 135 136 137  K 2.9* 3.9 3.9 3.9 4.0  CL 104 102 99 99 102  CO2 25 24 26 26 26   GLUCOSE 105* 103* 98 96 105*  BUN 11 10 11 13 16   CREATININE 1.16* 1.26* 1.39* 1.27* 1.30*  CALCIUM 8.2* 8.4* 8.5* 8.6* 8.6*   GFR: Estimated Creatinine Clearance: 31.6 mL/min (A) (by C-G formula based on SCr of 1.3 mg/dL (H)). Liver Function Tests: No results for input(s): AST, ALT, ALKPHOS, BILITOT, PROT, ALBUMIN in the last 168 hours. No results for input(s): LIPASE, AMYLASE in the last 168 hours. No results for input(s): AMMONIA in the last 168 hours. Coagulation Profile: No results for input(s): INR, PROTIME in the last 168 hours. Cardiac Enzymes: No results for input(s): CKTOTAL, CKMB, CKMBINDEX, TROPONINI in the last 168 hours. BNP (last 3 results) No results for input(s): PROBNP in the last 8760 hours. HbA1C: No results for input(s): HGBA1C in the last 72 hours. CBG: Recent Labs  Lab 09/09/19 0000 09/09/19 0827 09/09/19 1621 09/09/19 2204 09/10/19 0748  GLUCAP 89 85 95 120* 88   Lipid Profile: No results for input(s): CHOL, HDL, LDLCALC, TRIG, CHOLHDL, LDLDIRECT in the last 72 hours. Thyroid Function Tests: Recent Labs    09/07/19 0938  09/09/19 0320  TSH 7.370*  --   FREET4  --  1.35*  T3FREE  --  1.5*   Anemia Panel: No results for input(s): VITAMINB12, FOLATE, FERRITIN, TIBC, IRON, RETICCTPCT in the last 72 hours. Sepsis Labs: No results for input(s): PROCALCITON, LATICACIDVEN in the last 168 hours.  Recent Results (from the past 240 hour(s))  Surgical pcr screen     Status: None   Collection Time: 09/01/19  2:16 PM   Specimen: Nasal Mucosa; Nasal Swab  Result Value Ref Range Status   MRSA, PCR NEGATIVE NEGATIVE Final   Staphylococcus aureus NEGATIVE NEGATIVE Final    Comment: (NOTE) The Xpert SA Assay (FDA approved for NASAL specimens in patients 52 years of age and older), is one component of a comprehensive surveillance program. It is not intended to diagnose infection nor to guide or monitor treatment. Performed at [facility], [place] [address], [place], [place] [address]   SARS CORONAVIRUS 2 (TAT 6-24 HRS) Nasopharyngeal Nasopharyngeal Swab     Status: None   Collection Time: 09/06/19  4:34 PM   Specimen: Nasopharyngeal Swab  Result Value  Ref Range Status   SARS Coronavirus 2 NEGATIVE NEGATIVE Final    Comment: (NOTE) SARS-CoV-2 target nucleic acids are NOT DETECTED. The SARS-CoV-2 RNA is generally detectable in upper and lower respiratory specimens during the acute phase of infection. Negative results do not preclude SARS-CoV-2 infection, do not rule out co-infections with other pathogens, and should not be used as the sole basis for treatment or other patient management decisions. Negative results must be combined with clinical observations, patient history, and epidemiological information. The expected result is Negative. Fact Sheet for Patients: SugarRoll.be Fact Sheet for Healthcare Providers: https://www.woods-mathews.com/ This test is not yet approved or cleared by the [place] FDA and  has been authorized for detection and/or diagnosis of  SARS-CoV-2 by FDA under an Emergency Use Authorization (EUA). This EUA will remain  in effect (meaning this test can be used) for the duration of the COVID-19 declaration under Section 56 4(b)(1) of the Act, 21 U.S.C. section 360bbb-3(b)(1), unless the authorization is terminated or revoked sooner. Performed at [facility], [place] [address] [place], [place] [address]          Radiology Studies: No results found.      Scheduled Meds: . amiodarone  200 mg Oral Daily  . chlorhexidine  15 mL Mouth Rinse BID  . docusate sodium  100 mg Oral Daily  . feeding supplement (ENSURE ENLIVE)  237 mL Oral BID BM  . latanoprost  1 drop Both Eyes QHS  . levothyroxine  100 mcg Oral Q0600  . mouth rinse  15 mL Mouth Rinse q12n4p  . pantoprazole  40 mg Oral Daily  . polyethylene glycol  17 g Oral BID  . sodium chloride flush  10-40 mL Intracatheter Q12H   Continuous Infusions: . sodium chloride    . heparin 1,350 Units/hr (09/10/19 0520)     LOS: 12 days    Time spent: 35 minutes.     Elmarie Shiley, MD [facility]   If 7PM-7AM, please contact night-coverage [email or web address] Password [facility] 09/10/2019, 8:34 AM

## 2019-09-10 NOTE — Progress Notes (Signed)
Spoke with Dr. Lyman Bishop CCS in regards to brownish thick discharge that is around G-tube. He stated this can be an occasional occurrence that G-tubes have drainage of this consistency.  Also he stated if G-tube is not being used there is no need to flush. Pt is not using G-tube currently for tube feedings.

## 2019-09-10 NOTE — Progress Notes (Addendum)
  Progress Note    09/10/2019 10:14 AM 9 Days Post-Op  Subjective: Still having left-sided toe pain  Vitals:   09/10/19 0512 09/10/19 0815  BP: (!) 112/54 119/69  Pulse:    Resp:    Temp: 98.4 F (36.9 C)   SpO2:      Physical Exam: Bilateral feet are warm, there is discoloration of the left fifth and fourth toes particularly at the base  CBC    Component Value Date/Time   WBC 9.1 09/10/2019 0315   RBC 3.02 (L) 09/10/2019 0315   HGB 8.9 (L) 09/10/2019 0315   HGB 12.1 03/03/2018 1352   HCT 28.5 (L) 09/10/2019 0315   HCT 37.7 03/03/2018 1352   PLT 262 09/10/2019 0315   PLT 211 03/03/2018 1352   MCV 94.4 09/10/2019 0315   MCV 94 03/03/2018 1352   MCH 29.5 09/10/2019 0315   MCHC 31.2 09/10/2019 0315   RDW 13.2 09/10/2019 0315   RDW 14.4 03/03/2018 1352   LYMPHSABS 2.2 09/10/2019 0315   LYMPHSABS 2.4 03/03/2018 1352   MONOABS 0.8 09/10/2019 0315   EOSABS 0.2 09/10/2019 0315   EOSABS 0.2 03/03/2018 1352   BASOSABS 0.0 09/10/2019 0315   BASOSABS 0.0 03/03/2018 1352    BMET    Component Value Date/Time   NA 137 09/10/2019 0315   NA 141 03/03/2018 1352   K 4.0 09/10/2019 0315   CL 102 09/10/2019 0315   CO2 26 09/10/2019 0315   GLUCOSE 105 (H) 09/10/2019 0315   BUN 16 09/10/2019 0315   BUN 31 (H) 03/03/2018 1352   CREATININE 1.30 (H) 09/10/2019 0315   CALCIUM 8.6 (L) 09/10/2019 0315   GFRNONAA 38 (L) 09/10/2019 0315   GFRAA 44 (L) 09/10/2019 0315    INR    Component Value Date/Time   INR 0.98 03/09/2017 1215     Intake/Output Summary (Last 24 hours) at 09/10/2019 1014 Last data filed at 09/09/2019 1700 Gross per 24 hour  Intake -  Output 1025 ml  Net -1025 ml     Assessment:  85 y.o. female is here with gastric volvulus now has a G-tube in place.  She is having left fifth and fourth toe pain with moderately depressed ABI.  We will plan for aortogram with bilateral lower extremity runoff possible intervention on the left to improve her pain and  her preulcerative lesion tomorrow.  She is on Eliquis at home does not have any antiplatelet or statin agents on board will need these if intervention is undertaken.  I reviewed her angiogram from 2017 which demonstrated moderate length left SFA occlusion.   Lakin Rhine C. Donzetta Matters, MD [facility] Office: [phone] Pager: [phone]  09/10/2019 10:14 AM

## 2019-09-10 NOTE — Progress Notes (Addendum)
ANTICOAGULATION CONSULT NOTE - Follow Up Consult  Pharmacy Consult for Heparin Indication: atrial fibrillation  Allergies  Allergen Reactions  . Aspirin Other (See Comments)    Red splotches  . Atorvastatin Other (See Comments)    Muscle spams  . Nabumetone Itching  . Nsaids     Reaction (??)  . Pravastatin Other (See Comments)    Stiff walking difficulty Stiff walking difficulty  . Procaine Swelling  . Rosuvastatin Nausea Only and Other (See Comments)    Reflux and Muscle stiffness  . Simvastatin Nausea Only and Other (See Comments)    Reflux and Muscle stiffness  . Statins Other (See Comments)  . Evolocumab Rash  . Mobic [Meloxicam] Rash  . Penicillins Rash    Has patient had a PCN reaction causing immediate rash, facial/tongue/throat swelling, SOB or lightheadedness with hypotension: Yes Has patient had a PCN reaction causing severe rash involving mucus membranes or skin necrosis: No Has patient had a PCN reaction that required hospitalization: No Has patient had a PCN reaction occurring within the last 10 years: No If all of the above answers are "NO", then may proceed with Cephalosporin use.      Patient Measurements: Height: 5\' 3"  (160 cm) Weight: 163 lb 5.8 oz (74.1 kg) IBW/kg (Calculated) : 52.4 Heparin Dosing Weight: 69 kg   Vital Signs: Temp: 98.4 F (36.9 C) (11/22 0512) Temp Source: Oral (11/22 0512) BP: 119/69 (11/22 0815) Pulse Rate: 82 (11/22 0510)  Labs: Recent Labs    09/08/19 0654  09/09/19 0320 09/09/19 1633 09/09/19 1635 09/10/19 0315  HGB 9.9*  --  9.2*  --   --  8.9*  HCT 30.5*  --  28.9*  --   --  28.5*  PLT 221  --  223  --   --  262  APTT 109*   < > 80* 55*  --  93*  HEPARINUNFRC 1.86*  --  1.02*  --  0.78* 0.81*  CREATININE 1.39*  --  1.27*  --   --  1.30*   < > = values in this interval not displayed.    Estimated Creatinine Clearance: 31.6 mL/min (A) (by C-G formula based on SCr of 1.3 mg/dL (H)).   Assessment: 85 yr old  female with AFib on Eliquis PTA and was transitioned to IV heparin on admit. Then, was converted back to Eliquis after gastrostomy tube placement, but now with concern for LLE ischemia, pharmacy asked to transition back to IV heparin with for lower extremity arteriogram 11/23. Restarted on full dose heparin POD-7.  Heparin level and aPTT still not correlating; aPTT therapeutic. CBC stable, no issues with infusion or bleeding reported.  Goal of Therapy:  Heparin level 0.3-0.7 units/ml aPTT 66-102 sec Monitor platelets by anticoagulation protocol: Yes  Plan: Continue heparin IV 1350 units/hr Daily aPTT, heparin level, CBC Monitor for signs/symptoms of bleeding     Thank you for allowing pharmacy to participate in this patient's care.  Shamiyah Ngu L. Devin Going, [place] PGY1 Pharmacy Resident [phone] 09/10/19      8:24 AM  Please check AMION for all [place] phone numbers After 10:00 PM, call the [facility] [phone]

## 2019-09-11 ENCOUNTER — Encounter (HOSPITAL_COMMUNITY)
Admission: EM | Disposition: A | Discharge status: Skilled Nursing Facility | Payer: Self-pay | Source: Home / Self Care | Attending: Internal Medicine

## 2019-09-11 ENCOUNTER — Encounter (HOSPITAL_COMMUNITY): Payer: Self-pay | Admitting: Vascular Surgery

## 2019-09-11 DIAGNOSIS — I70212 Atherosclerosis of native arteries of extremities with intermittent claudication, left leg: Secondary | ICD-10-CM

## 2019-09-11 HISTORY — PX: PERIPHERAL VASCULAR INTERVENTION: CATH118257

## 2019-09-11 HISTORY — PX: PERIPHERAL VASCULAR BALLOON ANGIOPLASTY: CATH118281

## 2019-09-11 HISTORY — PX: ABDOMINAL AORTOGRAM W/LOWER EXTREMITY: CATH118223

## 2019-09-11 LAB — BASIC METABOLIC PANEL
Anion gap: 8 (ref 5–15)
BUN: 15 mg/dL (ref 8–23)
CO2: 26 mmol/L (ref 22–32)
Calcium: 8.5 mg/dL — ABNORMAL LOW (ref 8.9–10.3)
Chloride: 106 mmol/L (ref 98–111)
Creatinine, Ser: 1.25 mg/dL — ABNORMAL HIGH (ref 0.44–1.00)
GFR calc Af Amer: 46 mL/min — ABNORMAL LOW (ref >60)
GFR calc non Af Amer: 40 mL/min — ABNORMAL LOW (ref >60)
Glucose, Bld: 98 mg/dL (ref 70–99)
Potassium: 3.9 mmol/L (ref 3.5–5.1)
Sodium: 140 mmol/L (ref 135–145)

## 2019-09-11 LAB — CBC WITH DIFFERENTIAL/PLATELET
Abs Immature Granulocytes: 0.21 10*3/uL — ABNORMAL HIGH (ref 0.00–0.07)
Basophils Absolute: 0 10*3/uL (ref 0.0–0.1)
Basophils Relative: 1 %
Eosinophils Absolute: 0.3 10*3/uL (ref 0.0–0.5)
Eosinophils Relative: 3 %
HCT: 29.3 % — ABNORMAL LOW (ref 36.0–46.0)
Hemoglobin: 9.3 g/dL — ABNORMAL LOW (ref 12.0–15.0)
Immature Granulocytes: 2 %
Lymphocytes Relative: 27 %
Lymphs Abs: 2.3 10*3/uL (ref 0.7–4.0)
MCH: 30.3 pg (ref 26.0–34.0)
MCHC: 31.7 g/dL (ref 30.0–36.0)
MCV: 95.4 fL (ref 80.0–100.0)
Monocytes Absolute: 0.8 10*3/uL (ref 0.1–1.0)
Monocytes Relative: 10 %
Neutro Abs: 5 10*3/uL (ref 1.7–7.7)
Neutrophils Relative %: 57 %
Platelets: 282 10*3/uL (ref 150–400)
RBC: 3.07 MIL/uL — ABNORMAL LOW (ref 3.87–5.11)
RDW: 13.2 % (ref 11.5–15.5)
WBC: 8.6 10*3/uL (ref 4.0–10.5)
nRBC: 0 % (ref 0.0–0.2)

## 2019-09-11 LAB — APTT: aPTT: 97 seconds — ABNORMAL HIGH (ref 24–36)

## 2019-09-11 LAB — POCT ACTIVATED CLOTTING TIME: Activated Clotting Time: 241 seconds

## 2019-09-11 LAB — GLUCOSE, CAPILLARY
Glucose-Capillary: 114 mg/dL — ABNORMAL HIGH (ref 70–99)
Glucose-Capillary: 90 mg/dL (ref 70–99)
Glucose-Capillary: 98 mg/dL (ref 70–99)

## 2019-09-11 LAB — PROTIME-INR
INR: 1.1 (ref 0.8–1.2)
Prothrombin Time: 14 seconds (ref 11.4–15.2)

## 2019-09-11 LAB — HEPARIN LEVEL (UNFRACTIONATED): Heparin Unfractionated: 0.63 IU/mL (ref 0.30–0.70)

## 2019-09-11 SURGERY — ABDOMINAL AORTOGRAM W/LOWER EXTREMITY
Anesthesia: LOCAL | Laterality: Right

## 2019-09-11 MED ORDER — CLOPIDOGREL BISULFATE 300 MG PO TABS
ORAL_TABLET | ORAL | Status: AC
  Filled 2019-09-11: qty 1

## 2019-09-11 MED ORDER — FENTANYL CITRATE (PF) 100 MCG/2ML IJ SOLN
INTRAMUSCULAR | Status: AC
  Filled 2019-09-11: qty 2

## 2019-09-11 MED ORDER — SODIUM CHLORIDE 0.9 % IV SOLN: INTRAVENOUS | Status: AC

## 2019-09-11 MED ORDER — IODIXANOL 320 MG/ML IV SOLN
INTRAVENOUS | Status: DC | PRN
  Administered 2019-09-11: 180 mL via INTRA_ARTERIAL

## 2019-09-11 MED ORDER — ANGIOPLASTY BOOK
Freq: Once | Status: AC
  Administered 2019-09-12: 06:00:00
  Filled 2019-09-11: qty 1

## 2019-09-11 MED ORDER — FENTANYL CITRATE (PF) 100 MCG/2ML IJ SOLN
INTRAMUSCULAR | Status: DC | PRN
  Administered 2019-09-11 (×2): 25 ug via INTRAVENOUS

## 2019-09-11 MED ORDER — MORPHINE SULFATE (PF) 2 MG/ML IV SOLN
2.0000 mg | INTRAVENOUS | Status: DC | PRN
  Administered 2019-09-11 – 2019-09-14 (×5): 2 mg via INTRAVENOUS
  Filled 2019-09-11 (×5): qty 1

## 2019-09-11 MED ORDER — CLOPIDOGREL BISULFATE 75 MG PO TABS: 300.0000 mg | ORAL_TABLET | Freq: Once | ORAL | Status: DC

## 2019-09-11 MED ORDER — SODIUM CHLORIDE 0.9% FLUSH
3.0000 mL | Freq: Two times a day (BID) | INTRAVENOUS | Status: DC
  Administered 2019-09-13 – 2019-09-26 (×15): 3 mL via INTRAVENOUS

## 2019-09-11 MED ORDER — HYDRALAZINE HCL 20 MG/ML IJ SOLN: 5.0000 mg | INTRAMUSCULAR | Status: DC | PRN

## 2019-09-11 MED ORDER — ACETAMINOPHEN 325 MG PO TABS: 650.0000 mg | ORAL_TABLET | ORAL | Status: DC | PRN

## 2019-09-11 MED ORDER — LIDOCAINE HCL (PF) 1 % IJ SOLN
INTRAMUSCULAR | Status: DC | PRN
  Administered 2019-09-11: 5 mL via INTRADERMAL
  Administered 2019-09-11: 15 mL via INTRADERMAL

## 2019-09-11 MED ORDER — HEPARIN SODIUM (PORCINE) 1000 UNIT/ML IJ SOLN
INTRAMUSCULAR | Status: AC
  Filled 2019-09-11: qty 1

## 2019-09-11 MED ORDER — HEPARIN (PORCINE) IN NACL 1000-0.9 UT/500ML-% IV SOLN
INTRAVENOUS | Status: DC | PRN
  Administered 2019-09-11 (×2): 500 mL

## 2019-09-11 MED ORDER — CLOPIDOGREL BISULFATE 300 MG PO TABS
ORAL_TABLET | ORAL | Status: DC | PRN
  Administered 2019-09-11: 300 mg via ORAL

## 2019-09-11 MED ORDER — LIDOCAINE HCL (PF) 1 % IJ SOLN
INTRAMUSCULAR | Status: AC
  Filled 2019-09-11: qty 30

## 2019-09-11 MED ORDER — SODIUM CHLORIDE 0.9 % IV SOLN: 250.0000 mL | INTRAVENOUS | Status: DC | PRN

## 2019-09-11 MED ORDER — HEPARIN SODIUM (PORCINE) 1000 UNIT/ML IJ SOLN
INTRAMUSCULAR | Status: DC | PRN
  Administered 2019-09-11: 7000 [IU] via INTRAVENOUS

## 2019-09-11 MED ORDER — LABETALOL HCL 5 MG/ML IV SOLN: 10.0000 mg | INTRAVENOUS | Status: DC | PRN

## 2019-09-11 MED ORDER — OXYCODONE HCL 5 MG PO TABS
5.0000 mg | ORAL_TABLET | ORAL | Status: DC | PRN
  Administered 2019-09-14 – 2019-09-17 (×5): 5 mg via ORAL
  Administered 2019-09-18: 10 mg via ORAL
  Administered 2019-09-18: 5 mg via ORAL
  Administered 2019-09-19 – 2019-09-24 (×5): 10 mg via ORAL
  Filled 2019-09-11: qty 2
  Filled 2019-09-11: qty 1
  Filled 2019-09-11: qty 2
  Filled 2019-09-11: qty 1
  Filled 2019-09-11: qty 2
  Filled 2019-09-11 (×3): qty 1
  Filled 2019-09-11: qty 2
  Filled 2019-09-11: qty 1
  Filled 2019-09-11: qty 2
  Filled 2019-09-11 (×2): qty 1
  Filled 2019-09-11: qty 2
  Filled 2019-09-11: qty 1

## 2019-09-11 MED ORDER — SODIUM CHLORIDE 0.9% FLUSH: 3.0000 mL | INTRAVENOUS | Status: DC | PRN

## 2019-09-11 MED ORDER — APIXABAN 5 MG PO TABS
5.0000 mg | ORAL_TABLET | Freq: Two times a day (BID) | ORAL | Status: DC
  Administered 2019-09-11 – 2019-09-26 (×30): 5 mg via ORAL
  Filled 2019-09-11 (×30): qty 1

## 2019-09-11 MED ORDER — ONDANSETRON HCL 4 MG/2ML IJ SOLN
4.0000 mg | Freq: Four times a day (QID) | INTRAMUSCULAR | Status: DC | PRN
  Administered 2019-09-20: 4 mg via INTRAVENOUS
  Filled 2019-09-11 (×2): qty 2

## 2019-09-11 MED ORDER — GLYCERIN (LAXATIVE) 2.1 G RE SUPP
1.0000 | RECTAL | Status: DC | PRN
  Filled 2019-09-11: qty 1

## 2019-09-11 MED ORDER — CLOPIDOGREL BISULFATE 75 MG PO TABS
75.0000 mg | ORAL_TABLET | Freq: Every day | ORAL | Status: DC
  Administered 2019-09-12 – 2019-09-26 (×15): 75 mg via ORAL
  Filled 2019-09-11 (×15): qty 1

## 2019-09-11 MED ORDER — CLOPIDOGREL BISULFATE 75 MG PO TABS: 75.0000 mg | ORAL_TABLET | Freq: Every day | ORAL | Status: DC

## 2019-09-11 MED ORDER — HEPARIN (PORCINE) IN NACL 1000-0.9 UT/500ML-% IV SOLN
INTRAVENOUS | Status: AC
  Filled 2019-09-11: qty 1000

## 2019-09-11 SURGICAL SUPPLY — 27 items
BALLN STERLING OTW 3X60X150 (BALLOONS) ×3
BALLN STERLING OTW 4X220X150 (BALLOONS) ×3
BALLN STERLING OTW 5X220X150 (BALLOONS) ×3
BALLOON STERLING OTW 3X60X150 (BALLOONS) IMPLANT
BALLOON STERLING OTW 4X220X150 (BALLOONS) IMPLANT
BALLOON STERLING OTW 5X220X150 (BALLOONS) IMPLANT
CATH CXI 2.6F 90 ANG (CATHETERS) ×1
CATH OMNI FLUSH 5F 65CM (CATHETERS) ×1 IMPLANT
CATH SPRT ANG 90X2.3FR ACPT (CATHETERS) IMPLANT
CLOSURE MYNX CONTROL 6F/7F (Vascular Products) ×1 IMPLANT
DEVICE ONE SNARE 10MM (MISCELLANEOUS) ×1 IMPLANT
KIT ENCORE 26 ADVANTAGE (KITS) ×1 IMPLANT
KIT MICROPUNCTURE NIT STIFF (SHEATH) ×1 IMPLANT
KIT PV (KITS) ×3 IMPLANT
PATCH THROMBIX TOPICAL PLAIN (HEMOSTASIS) ×1 IMPLANT
SHEATH FLEX ANSEL ANG 6F 45CM (SHEATH) ×1 IMPLANT
SHEATH MICROPUNCTURE PEDAL 4FR (SHEATH) ×1 IMPLANT
SHEATH PINNACLE 5F 10CM (SHEATH) ×1 IMPLANT
SHEATH PINNACLE 6F 10CM (SHEATH) ×1 IMPLANT
SHEATH PROBE COVER 6X72 (BAG) ×1 IMPLANT
STENT VIABAHN 5X100X120 (Permanent Stent) ×1 IMPLANT
STENT VIABAHN 5X250X120 (Permanent Stent) ×1 IMPLANT
SYR MEDRAD MARK V 150ML (SYRINGE) ×1 IMPLANT
TRANSDUCER W/STOPCOCK (MISCELLANEOUS) ×3 IMPLANT
TRAY PV CATH (CUSTOM PROCEDURE TRAY) ×3 IMPLANT
WIRE BENTSON .035X145CM (WIRE) ×1 IMPLANT
WIRE G V18X300CM (WIRE) ×2 IMPLANT

## 2019-09-11 NOTE — Progress Notes (Signed)
   I evaluated patient back in her room.  She has a nonexpanding soft hematoma in her right groin down to the medial thigh.  Left foot is warmer.  Doppler did not work at the bedside.  Will recheck in a.m.  Mindel Friscia C. Donzetta Matters, MD [facility] [place] Office: [phone] Pager: [phone]

## 2019-09-11 NOTE — Op Note (Signed)
Patient name: Alyssa Odonnell MRN: [id number] DOB: Aug 15, 1934 Sex: female  09/11/2019 Pre-operative Diagnosis:  Post-operative diagnosis:  Same Surgeon:  Erlene Quan C. Donzetta Matters, MD Procedure Performed: 1.  Ultrasound-guided cannulation right common femoral artery 2.  Aortogram with bilateral lower extremity runoff 3.  Ultrasound-guided cannulation left posterior tibial artery 4.  Stent of left SFA with distal 5 x 25 cm Viabahn and proximal 5 x 10 cm Viabahn 5.  Plain balloon angioplasty of left tibioperoneal trunk with 3 mm balloon 6.  Minx device closure right common femoral artery  Indications: 85 year old female with history of right SFA intervention.  She now has discolored left toes lateral fourth and fifth toes on the plantar aspect.  She also has significant rest pain.  She is indicated for angiogram possible intervention.  Findings: The aorta and iliac segments were free of flow-limiting stenosis.  The right lower extremity SFA is occluded along with the stent.  Tibial runoff was insufficiently evaluated but she does appear to have flow into the foot.  On the left side the SFA is flush occluded.  She reconstitutes above-knee popliteal there is a stenosis at the level of the patella.  She has brisk runoff via the anterior tibial artery.  TP trunk is occluded has runoff via the posterior tibial and peroneal.  After intervention the SFA where previously was occluded the stent is now patent without any stenosis.  The TP trunk was previously occluded now is patent without flow-limiting stenosis or dissection.  There is palpable dorsalis pedis pulse at completion.   Procedure:  The patient was identified in the holding area and taken to room 8.  The patient was then placed supine on the table and prepped and draped in the usual sterile fashion.  A time out was called.  Ultrasound was used to evaluate the right comfortable arteries was noted be patent.  The areas anesthetized 1% lidocaine cannulated  micropuncture needle followed by wire and sheath.  Images saved the permanent record.  Bentson wire was placed followed by 5 [place] sheath.  Omni catheter placed to level L1 aortogram performed followed by bilateral lower extremity runoff with the above findings.  We then crossed the bifurcation with Omni catheter Bentson wire performed angled view which demonstrated flush occlusion of the SFA.  We used ultrasound to identify the posterior tibial artery.  This was cannulated micropuncture needle followed by wire and sheath.  We then went up and over the bifurcation with a long 6 French sheath to the common femoral artery.  Patient was fully heparinized at this time.  We used V 55 CXI catheter to cross retrograde through all the stenoses and occlusions.  We confirmed intraluminal access in the common femoral artery.  This was snared brought through and through access.  We then ballooned the SFA with 4 mm balloon.  A 5 mm Viabahn was placed distally followed by a second stent proximally to the level of the SFA takeoff.  These were postdilated with 5 mm balloon.  Completion demonstrated brisk flow through to the TP trunk.  This was then ballooned with 3 mm balloon at nominal pressure.  Completion demonstrated flow through the TP trunk down into the peroneal.  There was no flow in the PT given occlusive sheath distally.  The AT was large now much more brisk flow down to the foot.  With this we removed our wire.  We exchanged for short 6 French sheath in the right groin.  Minx device was deployed.  Sheath was removed from the left PT and pressure held.  There was a palpable anterior tibial and dorsalis pedis pulse to completion.  She tolerated procedure without any complication.  Contrast: 180cc   Savita Runner C. Donzetta Matters, MD [facility] [place] Office: [phone] Pager: [phone]

## 2019-09-11 NOTE — Progress Notes (Signed)
PROGRESS NOTE    Alyssa Odonnell  [id number] DOB: 1934/01/26 DOA: 08/28/2019 PCP: Drake Leach, MD   Brief Narrative: 85 year old who ambulate with help of walker, past medical history PAF Eliquis, embolic CVA, hypertension, hyperlipidemia, PAD, cholecystectomy who presented to [facility] ED on 08/28/2019 due to abrupt onset of severe nausea, dry heaving without vomiting, chest epigastric abdominal pain.  CT abdomen showed large hiatal hernia with gastric volvulus with gastric outlet obstruction.  Multiple unsuccessful attempts to pass NG tube at bedside.  IR consulted for NG tube under fluoroscopy.  General surgery consulted and patient was taken to the OR on 11/13 for laparoscopic gastropexy with placement of a 20 French gastrostomy tube with extensive lysis of adhesion.  Patient improved from gastric outlet obstruction post surgery. Patient develops left foot pain, she has known Periferal vascular diseases. She underwent arteriogram 11-22.    Assessment & Plan:   Principal Problem:   Volvulus of stomach Active Problems:   PAD (peripheral artery disease) (HCC)   Essential hypertension   PAF (paroxysmal atrial fibrillation) (HCC)   Gastric outlet obstruction   1-Large hiatal hernia with gastric outlet obstruction status post laparoscopic gastropexy with placement of gastrostomy tube with extensive lysis of adhesions. Patient initially presented with leukocytosis, CT abdomen pelvis showed gastric outlet obstruction. General surgery was consulted and patient underwent laparoscopic gastropexy with placement of gastrostomy tube and extensive lysis of adhesions. Per general surgery patient will require GT for 6 weeks. Had moderate size BM yesterday. Would continue with miralax.  Nurse report drainage from G tube, she will inform surgical team.  Tolerating diet.   2-Left 4, 5 toes pain, cyanosis ; concern for ischemia Patient with a known left SFA occlusion at [place] with  three-vessel runoff in 2017. ABI; resting right and left ankle brachial index indicates moderate right lower extremity arterial disease. -Vascular has been consulted.  -Eliquis was changed to heparin in anticipation for arteriogram on Monday On IV fluid pre arteriogram. Arteriogram today.   Hypokalemia: Resolved.   -Paroxysmal A. Fib: Patient was transition from heparin Gtt to eliquis 11-18. Plan to transition to oral amiodarone.   -Hypertension; became hypotensive with A. fib RVR on Cardizem. Hydrochlorothiazide and Benicar on hold.  -Hypothyroidism: Continue with Synthroid  -Chronic kidney disease a stage IIIb: Monitor kidney function daily  Anemia of chronic disease: Worsening by recent surgery.  Monitor hemoglobin daily.   elevated TSH; - Free T 3  Low, and T 4 high. She will need repeat thyroid function test in 4 weeks.  .   Estimated body mass index is 29.91 kg/m as calculated from the following:   Height as of this encounter: 5\' 3"  (1.6 m).   Weight as of this encounter: 76.6 kg.   DVT prophylaxis: Eliquis Code Status: Full code Family Communication: Discussed with patient Disposition Plan: She will require skilled nursing facility, social worker consulted Consultants:  Cardiology Surgery  Procedures:  laparoscopic gastropexy with placement of a 40 French gastrostomy tube with extensive lysis of adhesion.11-13    Antimicrobials:    Subjective: She is feeling tired today. I saw patient earlier this am, prior to arteriogram./  Denies dyspnea  Objective: Vitals:   09/11/19 1205 09/11/19 1212 09/11/19 1307 09/11/19 1357  BP: 106/80 135/67 128/75 107/68  Pulse:    (!) 102  Resp: 19   (!) 31  Temp:    97.7 F (36.5 C)  TempSrc:    Oral  SpO2:    100%  Weight:  Height:        Intake/Output Summary (Last 24 hours) at 09/11/2019 1510 Last data filed at 09/11/2019 1400 Gross per 24 hour  Intake 3070.77 ml  Output 1675 ml  Net 1395.77 ml   Filed  Weights   09/09/19 0615 09/10/19 0512 09/11/19 0435  Weight: 76.5 kg 74.1 kg 76.6 kg    Examination:  General exam: NAD Respiratory system: CTA Cardiovascular system: S 1, S 2 RRR Gastrointestinal system: BS present, G tube in place Central nervous system: Non focal.  Extremities; Symmetric power.  Skin: Left toes 4, 5 with cyanosis at bases.    Data Reviewed: I have personally reviewed following labs and imaging studies  CBC: Recent Labs  Lab 09/07/19 0540 09/08/19 0654 09/09/19 0320 09/10/19 0315 09/11/19 0358  WBC 7.2 8.5 9.1 9.1 8.6  NEUTROABS 4.0 4.9 5.5 5.7 5.0  HGB 9.5* 9.9* 9.2* 8.9* 9.3*  HCT 30.3* 30.5* 28.9* 28.5* 29.3*  MCV 94.7 92.7 93.5 94.4 95.4  PLT 211 221 223 262 [id number]   Basic Metabolic Panel: Recent Labs  Lab 09/07/19 0540 09/08/19 0654 09/09/19 0320 09/10/19 0315 09/11/19 0358  NA 137 135 136 137 140  K 3.9 3.9 3.9 4.0 3.9  CL 102 99 99 102 106  CO2 24 26 26 26 26   GLUCOSE 103* 98 96 105* 98  BUN 10 11 13 16 15   CREATININE 1.26* 1.39* 1.27* 1.30* 1.25*  CALCIUM 8.4* 8.5* 8.6* 8.6* 8.5*   GFR: Estimated Creatinine Clearance: 33.4 mL/min (A) (by C-G formula based on SCr of 1.25 mg/dL (H)). Liver Function Tests: No results for input(s): AST, ALT, ALKPHOS, BILITOT, PROT, ALBUMIN in the last 168 hours. No results for input(s): LIPASE, AMYLASE in the last 168 hours. No results for input(s): AMMONIA in the last 168 hours. Coagulation Profile: Recent Labs  Lab 09/11/19 0358  INR 1.1   Cardiac Enzymes: No results for input(s): CKTOTAL, CKMB, CKMBINDEX, TROPONINI in the last 168 hours. BNP (last 3 results) No results for input(s): PROBNP in the last 8760 hours. HbA1C: No results for input(s): HGBA1C in the last 72 hours. CBG: Recent Labs  Lab 09/09/19 2204 09/10/19 0748 09/10/19 1611 09/11/19 0008 09/11/19 0741  GLUCAP 120* 88 107* 98 90   Lipid Profile: No results for input(s): CHOL, HDL, LDLCALC, TRIG, CHOLHDL, LDLDIRECT in the  last 72 hours. Thyroid Function Tests: Recent Labs    09/09/19 0320  FREET4 1.35*  T3FREE 1.5*   Anemia Panel: No results for input(s): VITAMINB12, FOLATE, FERRITIN, TIBC, IRON, RETICCTPCT in the last 72 hours. Sepsis Labs: No results for input(s): PROCALCITON, LATICACIDVEN in the last 168 hours.  Recent Results (from the past 240 hour(s))  SARS CORONAVIRUS 2 (TAT 6-24 HRS) Nasopharyngeal Nasopharyngeal Swab     Status: None   Collection Time: 09/06/19  4:34 PM   Specimen: Nasopharyngeal Swab  Result Value Ref Range Status   SARS Coronavirus 2 NEGATIVE NEGATIVE Final    Comment: (NOTE) SARS-CoV-2 target nucleic acids are NOT DETECTED. The SARS-CoV-2 RNA is generally detectable in upper and lower respiratory specimens during the acute phase of infection. Negative results do not preclude SARS-CoV-2 infection, do not rule out co-infections with other pathogens, and should not be used as the sole basis for treatment or other patient management decisions. Negative results must be combined with clinical observations, patient history, and epidemiological information. The expected result is Negative. Fact Sheet for Patients: SugarRoll.be Fact Sheet for Healthcare Providers: https://www.woods-mathews.com/ This test is not yet approved or  cleared by the [place] and  has been authorized for detection and/or diagnosis of SARS-CoV-2 by FDA under an Emergency Use Authorization (EUA). This EUA will remain  in effect (meaning this test can be used) for the duration of the COVID-19 declaration under Section 56 4(b)(1) of the Act, 21 U.S.C. section 360bbb-3(b)(1), unless the authorization is terminated or revoked sooner. Performed at [facility], [place] [address], [place], [place] [address]          Radiology Studies: No results found.      Scheduled Meds: . amiodarone  200 mg Oral Daily  . apixaban  5 mg Oral BID  .  chlorhexidine  15 mL Mouth Rinse BID  . [START ON 09/12/2019] clopidogrel  75 mg Oral Q breakfast  . docusate sodium  100 mg Oral Daily  . feeding supplement (ENSURE ENLIVE)  237 mL Oral BID BM  . latanoprost  1 drop Both Eyes QHS  . levothyroxine  100 mcg Oral Q0600  . mouth rinse  15 mL Mouth Rinse q12n4p  . pantoprazole  40 mg Oral Daily  . polyethylene glycol  17 g Oral BID  . sodium chloride flush  10-40 mL Intracatheter Q12H  . sodium chloride flush  3 mL Intravenous Q12H   Continuous Infusions: . sodium chloride 75 mL/hr at 09/11/19 1132  . sodium chloride    . sodium chloride       LOS: 13 days    Time spent: 35 minutes.     Elmarie Shiley, MD [facility]   If 7PM-7AM, please contact night-coverage [email or web address] Password [email or web address] 09/11/2019, 3:10 PM

## 2019-09-11 NOTE — Progress Notes (Signed)
  Progress Note    09/11/2019 9:06 AM Day of Surgery  Subjective:  No overnight issues  Vitals:   09/11/19 0739 09/11/19 0903  BP: 137/77   Pulse: 82   Resp: 20   Temp: 98.4 F (36.9 C)   SpO2: 100% 95%    Physical Exam: [email or web address] Non labored respirations Left foot with stable pain  CBC    Component Value Date/Time   WBC 8.6 09/11/2019 0358   RBC 3.07 (L) 09/11/2019 0358   HGB 9.3 (L) 09/11/2019 0358   HGB 12.1 03/03/2018 1352   HCT 29.3 (L) 09/11/2019 0358   HCT 37.7 03/03/2018 1352   PLT 282 09/11/2019 0358   PLT 211 03/03/2018 1352   MCV 95.4 09/11/2019 0358   MCV 94 03/03/2018 1352   MCH 30.3 09/11/2019 0358   MCHC 31.7 09/11/2019 0358   RDW 13.2 09/11/2019 0358   RDW 14.4 03/03/2018 1352   LYMPHSABS 2.3 09/11/2019 0358   LYMPHSABS 2.4 03/03/2018 1352   MONOABS 0.8 09/11/2019 0358   EOSABS 0.3 09/11/2019 0358   EOSABS 0.2 03/03/2018 1352   BASOSABS 0.0 09/11/2019 0358   BASOSABS 0.0 03/03/2018 1352    BMET    Component Value Date/Time   NA 140 09/11/2019 0358   NA 141 03/03/2018 1352   K 3.9 09/11/2019 0358   CL 106 09/11/2019 0358   CO2 26 09/11/2019 0358   GLUCOSE 98 09/11/2019 0358   BUN 15 09/11/2019 0358   BUN 31 (H) 03/03/2018 1352   CREATININE 1.25 (H) 09/11/2019 0358   CALCIUM 8.5 (L) 09/11/2019 0358   GFRNONAA 40 (L) 09/11/2019 0358   GFRAA 46 (L) 09/11/2019 0358    INR    Component Value Date/Time   INR 1.1 09/11/2019 0358     Intake/Output Summary (Last 24 hours) at 09/11/2019 0906 Last data filed at 09/11/2019 0700 Gross per 24 hour  Intake 3410.77 ml  Output 1401 ml  Net 2009.77 ml     Assessment/plan:  85 y.o. female is here with gastric volvulus now has a G-tube in place.  She is having left fifth and fourth toe pain with moderately depressed ABI.  We will plan for aortogram with bilateral lower extremity runoff possible intervention on the left to improve her pain and her preulcerative today in pv lab. She understands  r/b/a and consent signed.    Chayna Surratt C. Donzetta Matters, MD [facility] Office: [phone] Pager: [phone]  09/11/2019 9:06 AM

## 2019-09-11 NOTE — Progress Notes (Signed)
PT Cancellation Note  Patient Details Name: Alyssa Odonnell MRN: [id number] DOB: May 08, 1934   Cancelled Treatment:    Reason Eval/Treat Not Completed: Patient at procedure or test/unavailable patient OTF for procedure, unavailable to participate in PT this morning. Will follow and attempt to check back if time/schedule allow.    Windell Norfolk, DPT, PN1   Supplemental Physical Therapist [facility]    Pager [phone] Acute Rehab Office [phone]

## 2019-09-12 ENCOUNTER — Encounter (HOSPITAL_COMMUNITY): Payer: Self-pay | Admitting: Vascular Surgery

## 2019-09-12 LAB — CBC WITH DIFFERENTIAL/PLATELET
Abs Immature Granulocytes: 0.2 10*3/uL — ABNORMAL HIGH (ref 0.00–0.07)
Basophils Absolute: 0 10*3/uL (ref 0.0–0.1)
Basophils Relative: 0 %
Eosinophils Absolute: 0.2 10*3/uL (ref 0.0–0.5)
Eosinophils Relative: 1 %
HCT: 22.7 % — ABNORMAL LOW (ref 36.0–46.0)
Hemoglobin: 7.2 g/dL — ABNORMAL LOW (ref 12.0–15.0)
Immature Granulocytes: 2 %
Lymphocytes Relative: 15 %
Lymphs Abs: 1.7 10*3/uL (ref 0.7–4.0)
MCH: 29.8 pg (ref 26.0–34.0)
MCHC: 31.7 g/dL (ref 30.0–36.0)
MCV: 93.8 fL (ref 80.0–100.0)
Monocytes Absolute: 1.1 10*3/uL — ABNORMAL HIGH (ref 0.1–1.0)
Monocytes Relative: 10 %
Neutro Abs: 8 10*3/uL — ABNORMAL HIGH (ref 1.7–7.7)
Neutrophils Relative %: 72 %
Platelets: 246 10*3/uL (ref 150–400)
RBC: 2.42 MIL/uL — ABNORMAL LOW (ref 3.87–5.11)
RDW: 13.1 % (ref 11.5–15.5)
WBC: 11.1 10*3/uL — ABNORMAL HIGH (ref 4.0–10.5)
nRBC: 0 % (ref 0.0–0.2)

## 2019-09-12 LAB — BASIC METABOLIC PANEL
Anion gap: 8 (ref 5–15)
BUN: 13 mg/dL (ref 8–23)
CO2: 24 mmol/L (ref 22–32)
Calcium: 8.1 mg/dL — ABNORMAL LOW (ref 8.9–10.3)
Chloride: 103 mmol/L (ref 98–111)
Creatinine, Ser: 1.35 mg/dL — ABNORMAL HIGH (ref 0.44–1.00)
GFR calc Af Amer: 42 mL/min — ABNORMAL LOW (ref >60)
GFR calc non Af Amer: 36 mL/min — ABNORMAL LOW (ref >60)
Glucose, Bld: 119 mg/dL — ABNORMAL HIGH (ref 70–99)
Potassium: 3.9 mmol/L (ref 3.5–5.1)
Sodium: 135 mmol/L (ref 135–145)

## 2019-09-12 LAB — GLUCOSE, CAPILLARY
Glucose-Capillary: 105 mg/dL — ABNORMAL HIGH (ref 70–99)
Glucose-Capillary: 118 mg/dL — ABNORMAL HIGH (ref 70–99)
Glucose-Capillary: 128 mg/dL — ABNORMAL HIGH (ref 70–99)

## 2019-09-12 LAB — HEMOGLOBIN AND HEMATOCRIT, BLOOD
HCT: 24.1 % — ABNORMAL LOW (ref 36.0–46.0)
Hemoglobin: 8 g/dL — ABNORMAL LOW (ref 12.0–15.0)

## 2019-09-12 LAB — PREPARE RBC (CROSSMATCH)

## 2019-09-12 LAB — ABO/RH: ABO/RH(D): O POS

## 2019-09-12 MED ORDER — SODIUM CHLORIDE 0.9% IV SOLUTION
Freq: Once | INTRAVENOUS | Status: AC
  Administered 2019-09-12: 11:00:00 via INTRAVENOUS

## 2019-09-12 MED ORDER — SODIUM CHLORIDE 0.9 % IV SOLN
INTRAVENOUS | Status: DC
  Administered 2019-09-12: 14:00:00 via INTRAVENOUS

## 2019-09-12 NOTE — Progress Notes (Signed)
PROGRESS NOTE    Alyssa Odonnell  [id number] DOB: Jun 24, 1934 DOA: 08/28/2019 PCP: Drake Leach, MD   Brief Narrative: 85 year old who ambulate with help of walker, past medical history PAF Eliquis, embolic CVA, hypertension, hyperlipidemia, PAD, cholecystectomy who presented to [facility] ED on 08/28/2019 due to abrupt onset of severe nausea, dry heaving without vomiting, chest epigastric abdominal pain.  CT abdomen showed large hiatal hernia with gastric volvulus with gastric outlet obstruction.  Multiple unsuccessful attempts to pass NG tube at bedside.  IR consulted for NG tube under fluoroscopy.  General surgery consulted and patient was taken to the OR on 11/13 for laparoscopic gastropexy with placement of a 20 French gastrostomy tube with extensive lysis of adhesion.  Patient improved from gastric outlet obstruction post surgery. Patient develops left foot pain, she has known Periferal vascular diseases. She underwent arteriogram 11-23.,  Stent of left SFA with distal 5 x 25 cm Viabahn on proximal 5 x 10 cm Viabahn, plain balloon angioplasty of left tibioperoneal trunk with 3 mm balloon.  Postoperative patient develop nonexpanding soft hematoma in the right groin.  Hemoglobin dropped to 7 post arteriogram.  She will receive 1 unit of packed red blood cell today.     Assessment & Plan:   Principal Problem:   Volvulus of stomach Active Problems:   PAD (peripheral artery disease) (HCC)   Essential hypertension   PAF (paroxysmal atrial fibrillation) (HCC)   Gastric outlet obstruction   1-Large hiatal hernia with gastric outlet obstruction status post laparoscopic gastropexy with placement of gastrostomy tube with extensive lysis of adhesions. Patient initially presented with leukocytosis, CT abdomen pelvis showed gastric outlet obstruction. General surgery was consulted and patient underwent laparoscopic gastropexy with placement of gastrostomy tube and extensive lysis of  adhesions. Per general surgery patient will require GT for 6 weeks. Continue with miralax.  Tolerating diet.   2-Left 4, 5 toes pain, cyanosis ; concern for ischemia Patient with a known left SFA occlusion at [place] with three-vessel runoff in 2017. -ABI; resting right and left ankle brachial index indicates moderate right lower extremity arterial disease. -Vascular has been consulted.  -S/P arteriogram 11-23.,  Stent of left SFA with distal 5 x 25 cm Viabahn on proximal 5 x 10 cm Viabahn, plain balloon angioplasty of left tibioperoneal trunk with 3 mm balloon. -Vascular recommend plavix.   Acute blood loss anemia; groin hematoma post arteriogram.  Plan to transfuse one unit PRBC> 11-24 Anemia of chronic disease:  Hypokalemia: Resolved.   -Paroxysmal A. Fib: Back on eliquis.  Plan to transition to oral amiodarone.   -Hypertension; became hypotensive with A. fib RVR on Cardizem. Hydrochlorothiazide and Benicar on hold.  -Hypothyroidism: Continue with Synthroid  -Chronic kidney disease a stage IIIb: Monitor kidney function daily    elevated TSH; - Free T 3  Low, and T 4 high. She will need repeat thyroid function test in 4 weeks.  .   Estimated body mass index is 29.84 kg/m as calculated from the following:   Height as of this encounter: 5\' 3"  (1.6 m).   Weight as of this encounter: 76.4 kg.   DVT prophylaxis: Eliquis Code Status: Full code Family Communication: Discussed with patient Disposition Plan: She will require skilled nursing facility, social worker consulted Consultants:  Cardiology Surgery  Procedures:  laparoscopic gastropexy with placement of a 34 French gastrostomy tube with extensive lysis of adhesion.11-13    Antimicrobials:    Subjective: She feels tired, fatigue. Denies foot pain  Objective: Vitals:   09/12/19 0601 09/12/19 0800 09/12/19 1030 09/12/19 1057  BP:  117/63 126/61 131/66  Pulse:   89 96  Resp: 20  (!) 22 18  Temp:   99 F (37.2 C) 98.5 F (36.9 C) 98.2 F (36.8 C)  TempSrc:  Oral Oral Oral  SpO2:   100% 98%  Weight: 76.4 kg     Height:        Intake/Output Summary (Last 24 hours) at 09/12/2019 1259 Last data filed at 09/12/2019 [id number] Gross per 24 hour  Intake 956.2 ml  Output 775 ml  Net 181.2 ml   Filed Weights   09/10/19 0512 09/11/19 0435 09/12/19 0601  Weight: 74.1 kg 76.6 kg 76.4 kg    Examination:  General exam: NAD Respiratory system: CTA Cardiovascular system: S 1, S 2 RRR Gastrointestinal system: BS present, soft, G tube in place Central nervous system: Non focal.  Extremities; Symmetric power.  Skin: Left toes 4, 5 with cyanosis at bases.    Data Reviewed: I have personally reviewed following labs and imaging studies  CBC: Recent Labs  Lab 09/08/19 0654 09/09/19 0320 09/10/19 0315 09/11/19 0358 09/12/19 0409  WBC 8.5 9.1 9.1 8.6 11.1*  NEUTROABS 4.9 5.5 5.7 5.0 8.0*  HGB 9.9* 9.2* 8.9* 9.3* 7.2*  HCT 30.5* 28.9* 28.5* 29.3* 22.7*  MCV 92.7 93.5 94.4 95.4 93.8  PLT 221 223 262 282 0000000   Basic Metabolic Panel: Recent Labs  Lab 09/08/19 0654 09/09/19 0320 09/10/19 0315 09/11/19 0358 09/12/19 0409  NA 135 136 137 140 135  K 3.9 3.9 4.0 3.9 3.9  CL 99 99 102 106 103  CO2 26 26 26 26 24   GLUCOSE 98 96 105* 98 119*  BUN 11 13 16 15 13   CREATININE 1.39* 1.27* 1.30* 1.25* 1.35*  CALCIUM 8.5* 8.6* 8.6* 8.5* 8.1*   GFR: Estimated Creatinine Clearance: 30.9 mL/min (A) (by C-G formula based on SCr of 1.35 mg/dL (H)). Liver Function Tests: No results for input(s): AST, ALT, ALKPHOS, BILITOT, PROT, ALBUMIN in the last 168 hours. No results for input(s): LIPASE, AMYLASE in the last 168 hours. No results for input(s): AMMONIA in the last 168 hours. Coagulation Profile: Recent Labs  Lab 09/11/19 0358  INR 1.1   Cardiac Enzymes: No results for input(s): CKTOTAL, CKMB, CKMBINDEX, TROPONINI in the last 168 hours. BNP (last 3 results) No results for input(s):  PROBNP in the last 8760 hours. HbA1C: No results for input(s): HGBA1C in the last 72 hours. CBG: Recent Labs  Lab 09/11/19 0008 09/11/19 0741 09/11/19 1656 09/11/19 2359 09/12/19 0734  GLUCAP 98 90 114* 128* 105*   Lipid Profile: No results for input(s): CHOL, HDL, LDLCALC, TRIG, CHOLHDL, LDLDIRECT in the last 72 hours. Thyroid Function Tests: No results for input(s): TSH, T4TOTAL, FREET4, T3FREE, THYROIDAB in the last 72 hours. Anemia Panel: No results for input(s): VITAMINB12, FOLATE, FERRITIN, TIBC, IRON, RETICCTPCT in the last 72 hours. Sepsis Labs: No results for input(s): PROCALCITON, LATICACIDVEN in the last 168 hours.  Recent Results (from the past 240 hour(s))  SARS CORONAVIRUS 2 (TAT 6-24 HRS) Nasopharyngeal Nasopharyngeal Swab     Status: None   Collection Time: 09/06/19  4:34 PM   Specimen: Nasopharyngeal Swab  Result Value Ref Range Status   SARS Coronavirus 2 NEGATIVE NEGATIVE Final    Comment: (NOTE) SARS-CoV-2 target nucleic acids are NOT DETECTED. The SARS-CoV-2 RNA is generally detectable in upper and lower respiratory specimens during the acute phase of infection. Negative  results do not preclude SARS-CoV-2 infection, do not rule out co-infections with other pathogens, and should not be used as the sole basis for treatment or other patient management decisions. Negative results must be combined with clinical observations, patient history, and epidemiological information. The expected result is Negative. Fact Sheet for Patients: SugarRoll.be Fact Sheet for Healthcare Providers: https://www.woods-mathews.com/ This test is not yet approved or cleared by the [place] FDA and  has been authorized for detection and/or diagnosis of SARS-CoV-2 by FDA under an Emergency Use Authorization (EUA). This EUA will remain  in effect (meaning this test can be used) for the duration of the COVID-19 declaration under Section  56 4(b)(1) of the Act, 21 U.S.C. section 360bbb-3(b)(1), unless the authorization is terminated or revoked sooner. Performed at [facility], [place] [address] [place], [place] [address]          Radiology Studies: No results found.      Scheduled Meds: . sodium chloride   Intravenous Once  . amiodarone  200 mg Oral Daily  . apixaban  5 mg Oral BID  . chlorhexidine  15 mL Mouth Rinse BID  . clopidogrel  75 mg Oral Q breakfast  . docusate sodium  100 mg Oral Daily  . feeding supplement (ENSURE ENLIVE)  237 mL Oral BID BM  . latanoprost  1 drop Both Eyes QHS  . levothyroxine  100 mcg Oral Q0600  . mouth rinse  15 mL Mouth Rinse q12n4p  . pantoprazole  40 mg Oral Daily  . polyethylene glycol  17 g Oral BID  . sodium chloride flush  10-40 mL Intracatheter Q12H  . sodium chloride flush  3 mL Intravenous Q12H   Continuous Infusions: . sodium chloride 75 mL/hr at 09/11/19 1132  . sodium chloride       LOS: 14 days    Time spent: 35 minutes.     Elmarie Shiley, MD [facility]   If 7PM-7AM, please contact night-coverage [email or web address] Password [facility] 09/12/2019, 12:59 PM

## 2019-09-12 NOTE — Progress Notes (Signed)
PT Cancellation Note  Patient Details Name: Alyssa Odonnell MRN: [id number] DOB: [birth year]   Cancelled Treatment:    Reason Eval/Treat Not Completed: Patient declined, no reason specified. Pt declined PT today due to worsening pain in LLE in addition to her currently receiving blood. Pt was educated on bed-level exercises she can do with BLE and progression of exercises she can complete with RLE to maintain strength. Pt agreeable to education, but still not wanting to participate in therapy at this time due to pain. PT will continue to follow as time/schedule allow.   Mickey Farber, PT, DPT   Acute Rehabilitation Department [phone]   Otho Bellows 09/12/2019, 12:59 PM

## 2019-09-12 NOTE — Progress Notes (Addendum)
  Progress Note    09/12/2019 11:08 AM 1 Day Post-Op  Subjective: Patient left foot feeling much better  Vitals:   09/12/19 1030 09/12/19 1057  BP: 126/61 131/66  Pulse: 89 96  Resp: (!) 22 18  Temp: 98.5 F (36.9 C) 98.2 F (36.8 C)  SpO2: 100% 98%    Physical Exam: Awake alert oriented Right groin is soft with stable hematoma Left foot is warm there is strong anterior tibial dorsalis pedis signals  CBC    Component Value Date/Time   WBC 11.1 (H) 09/12/2019 0409   RBC 2.42 (L) 09/12/2019 0409   HGB 7.2 (L) 09/12/2019 0409   HGB 12.1 03/03/2018 1352   HCT 22.7 (L) 09/12/2019 0409   HCT 37.7 03/03/2018 1352   PLT 246 09/12/2019 0409   PLT 211 03/03/2018 1352   MCV 93.8 09/12/2019 0409   MCV 94 03/03/2018 1352   MCH 29.8 09/12/2019 0409   MCHC 31.7 09/12/2019 0409   RDW 13.1 09/12/2019 0409   RDW 14.4 03/03/2018 1352   LYMPHSABS 1.7 09/12/2019 0409   LYMPHSABS 2.4 03/03/2018 1352   MONOABS 1.1 (H) 09/12/2019 0409   EOSABS 0.2 09/12/2019 0409   EOSABS 0.2 03/03/2018 1352   BASOSABS 0.0 09/12/2019 0409   BASOSABS 0.0 03/03/2018 1352    BMET    Component Value Date/Time   NA 135 09/12/2019 0409   NA 141 03/03/2018 1352   K 3.9 09/12/2019 0409   CL 103 09/12/2019 0409   CO2 24 09/12/2019 0409   GLUCOSE 119 (H) 09/12/2019 0409   BUN 13 09/12/2019 0409   BUN 31 (H) 03/03/2018 1352   CREATININE 1.35 (H) 09/12/2019 0409   CALCIUM 8.1 (L) 09/12/2019 0409   GFRNONAA 36 (L) 09/12/2019 0409   GFRAA 42 (L) 09/12/2019 0409    INR    Component Value Date/Time   INR 1.1 09/11/2019 0358     Intake/Output Summary (Last 24 hours) at 09/12/2019 1108 Last data filed at 09/12/2019 [id number] Gross per 24 hour  Intake 1186.2 ml  Output 775 ml  Net 411.2 ml     Assessment:  85 y.o. female is s/p 1.  Ultrasound-guided cannulation right common femoral artery 2.  Aortogram with bilateral lower extremity runoff 3.  Ultrasound-guided cannulation left posterior  tibial artery 4.  Stent of left SFA with distal 5 x 25 cm Viabahn and proximal 5 x 10 cm Viabahn 5.  Plain balloon angioplasty of left tibioperoneal trunk with 3 mm balloon 6.  Minx device closure right common femoral artery  Plan: Okay for discharge from vascular standpoint Will need prescription for Plavix on discharge Follow-up in our office in 4 to 6 weeks with left lower extremity duplex and ABIs.   Quintara Bost C. Donzetta Matters, MD [facility] [place] Office: [phone] Pager: [phone]  09/12/2019 11:08 AM

## 2019-09-13 LAB — CBC WITH DIFFERENTIAL/PLATELET
Abs Immature Granulocytes: 0.13 10*3/uL — ABNORMAL HIGH (ref 0.00–0.07)
Basophils Absolute: 0 10*3/uL (ref 0.0–0.1)
Basophils Relative: 0 %
Eosinophils Absolute: 0.1 10*3/uL (ref 0.0–0.5)
Eosinophils Relative: 2 %
HCT: 22.6 % — ABNORMAL LOW (ref 36.0–46.0)
Hemoglobin: 7.2 g/dL — ABNORMAL LOW (ref 12.0–15.0)
Immature Granulocytes: 1 %
Lymphocytes Relative: 15 %
Lymphs Abs: 1.4 10*3/uL (ref 0.7–4.0)
MCH: 29.4 pg (ref 26.0–34.0)
MCHC: 31.9 g/dL (ref 30.0–36.0)
MCV: 92.2 fL (ref 80.0–100.0)
Monocytes Absolute: 1 10*3/uL (ref 0.1–1.0)
Monocytes Relative: 11 %
Neutro Abs: 6.6 10*3/uL (ref 1.7–7.7)
Neutrophils Relative %: 71 %
Platelets: 214 10*3/uL (ref 150–400)
RBC: 2.45 MIL/uL — ABNORMAL LOW (ref 3.87–5.11)
RDW: 13.7 % (ref 11.5–15.5)
WBC: 9.3 10*3/uL (ref 4.0–10.5)
nRBC: 0 % (ref 0.0–0.2)

## 2019-09-13 LAB — HEMOGLOBIN AND HEMATOCRIT, BLOOD
HCT: 22.7 % — ABNORMAL LOW (ref 36.0–46.0)
HCT: 29.8 % — ABNORMAL LOW (ref 36.0–46.0)
Hemoglobin: 7.3 g/dL — ABNORMAL LOW (ref 12.0–15.0)
Hemoglobin: 9.9 g/dL — ABNORMAL LOW (ref 12.0–15.0)

## 2019-09-13 LAB — GLUCOSE, CAPILLARY
Glucose-Capillary: 104 mg/dL — ABNORMAL HIGH (ref 70–99)
Glucose-Capillary: 107 mg/dL — ABNORMAL HIGH (ref 70–99)
Glucose-Capillary: 109 mg/dL — ABNORMAL HIGH (ref 70–99)
Glucose-Capillary: 119 mg/dL — ABNORMAL HIGH (ref 70–99)

## 2019-09-13 LAB — PREPARE RBC (CROSSMATCH)

## 2019-09-13 MED ORDER — SODIUM CHLORIDE 0.9% IV SOLUTION
Freq: Once | INTRAVENOUS | Status: AC
  Administered 2019-09-13: 11:00:00 via INTRAVENOUS

## 2019-09-13 NOTE — Progress Notes (Signed)
PROGRESS NOTE  [name]  DOB: 1934/07/13  PCP: Drake Leach, MD [id number]  DOA: 08/28/2019  LOS: 15 days   Chief Complaint  Patient presents with  . 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  Patient presented to the ED on 11/9 with complaint of sudden onset severe nausea, dry heaving without vomiting, chest epigastric abdominal pain.   CT abdomen showed large hiatal hernia with gastric volvulus with gastric outlet obstruction. Multiple unsuccessful attempts to pass NG tube at bedside as well as under fluoroscopy.  General surgery consulted. 11/13, patient underwent laparoscopic gastropexy with placement of a 20 French gastrostomy tube with extensive lysis of adhesion. Prolonged but good postoperative outcome and is now on oral appetite and hydration. During this hospitalization, patient also underwent left lower extremity arteriogram and stenting.  Subjective: Patient was seen and examined this morning.  Pleasant elderly Caucasian female.  Propped up in bed.  Her biggest complaint at this time is not having appetite or thirst.  She states she feels full even with small amount of food or drink.   Assessment/Plan: Large hiatal hernia with gastric outlet obstruction  -Presented with sudden onset nausea, chest/epigastric abdominal pain  -CT scan showed large hiatal hernia with gastric outlet obstruction -11/13, patient underwent laparoscopic gastropexy with placement of a 42 French gastrostomy tube with extensive lysis of adhesion. -Per general surgery patient will require GT for 6 weeks. -Patient complains of feeling full with appetite. Encouraged to take frequent small meals.  Acute ischemia of left fourth and fifth toe Peripheral artery disease -During this hospitalization, patient complained of pain and cyanosis of the  left fourth and fifth toe.  There was a concern of ischemia.  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 -ABI; resting right and left ankle brachial index indicate moderate right lower extremity arterial disease. -Vascular was consulted.  -s/p arteriogram 11-23. stent of left SFA with distal 5 x 25 cm Viabahn on proximal 5 x 10 cm Viabahn, plain balloon angioplasty of left tibioperoneal trunk with 3 mm balloon. -Vascular recommended plavix.   Acute blood loss anemia; groin hematoma post arteriogram.  Anemia of chronic disease -11/24, 1 unit of PRBC transfused.  Hemoglobin remains low at 7.3.  Will give 1 more unit of PRBC today.    Hypokalemia: Resolved.   Paroxysmal A. Fib On amiodarone and Eliquis.  Hypertension: -Blood pressure stable. Benicar and hydrochlorothiazide remain on hold.   Chronic kidney disease a stage IIIb: Monitor kidney function daily  Hypothyroidism: Continue with Synthroid  Elevated TSH; - Free T 3  Low, and T 4 high. She will need repeat thyroid function test in 4 weeks.   Mobility: Increase ambulation.  PT will see her today again. Diet: Regular diet Fluid: I would avoid IV hydration and encourage oral hydration. DVT prophylaxis:  Eliquis Code Status:   Code Status: Full Code  Family Communication:  Not at bedside Expected Discharge:  Needs SNF at discharge.  Consultants:    Procedures:    Antimicrobials: Anti-infectives (From admission, onward)   Start     Dose/Rate Route Frequency Ordered Stop   09/01/19 1400  ciprofloxacin (CIPRO) IVPB 400 mg     400 mg 200 mL/hr over 60 Minutes Intravenous On call to O.R. 09/01/19 0738 09/01/19 1615      Diet  Order            Diet regular Room service appropriate? Yes; Fluid consistency: Thin  Diet effective now              Infusions:  . sodium chloride      Scheduled Meds: . sodium chloride   Intravenous Once  . amiodarone  200 mg Oral  Daily  . apixaban  5 mg Oral BID  . chlorhexidine  15 mL Mouth Rinse BID  . clopidogrel  75 mg Oral Q breakfast  . docusate sodium  100 mg Oral Daily  . feeding supplement (ENSURE ENLIVE)  237 mL Oral BID BM  . latanoprost  1 drop Both Eyes QHS  . levothyroxine  100 mcg Oral Q0600  . mouth rinse  15 mL Mouth Rinse q12n4p  . pantoprazole  40 mg Oral Daily  . polyethylene glycol  17 g Oral BID  . sodium chloride flush  3 mL Intravenous Q12H    PRN meds: sodium chloride, acetaminophen, bisacodyl, Glycerin (Adult), hydrALAZINE, hydrALAZINE, labetalol, methocarbamol, morphine injection, ondansetron (ZOFRAN) IV, oxyCODONE, promethazine, sodium chloride flush, traMADol, white petrolatum   Objective: Vitals:   09/13/19 0422 09/13/19 0900  BP: (!) 123/57 127/62  Pulse: 91 99  Resp:  20  Temp: 98.5 F (36.9 C)   SpO2: 99% 99%    Intake/Output Summary (Last 24 hours) at 09/13/2019 1038 Last data filed at 09/13/2019 0902 Gross per 24 hour  Intake 1279.81 ml  Output 1700 ml  Net -420.19 ml   Filed Weights   09/11/19 0435 09/12/19 0601 09/13/19 0422  Weight: 76.6 kg 76.4 kg 76.9 kg   Weight change: 0.5 kg Body mass index is 30.03 kg/m.   Physical Exam: General exam: Appears calm and comfortable.  Skin: No rashes, lesions or ulcers. HEENT: Atraumatic, normocephalic, supple neck, no obvious bleeding Lungs: Clear to auscultation bilaterally CVS: Regular rate and rhythm, no murmur GI/Abd soft, mild appropriate tenderness postsurgical, bowel sound present CNS: Alert, awake, alert x3 Psychiatry: Mood appropriate Extremities: No pedal edema, no calf tenderness  Data Review: I have personally reviewed the laboratory data and studies available.  Recent Labs  Lab 09/09/19 0320 09/10/19 0315 09/11/19 0358 09/12/19 0409 09/12/19 2158 09/13/19 0339 09/13/19 0542  WBC 9.1 9.1 8.6 11.1*  --  9.3  --   NEUTROABS 5.5 5.7 5.0 8.0*  --  6.6  --   HGB 9.2* 8.9* 9.3* 7.2* 8.0* 7.2*  7.3*  HCT 28.9* 28.5* 29.3* 22.7* 24.1* 22.6* 22.7*  MCV 93.5 94.4 95.4 93.8  --  92.2  --   PLT 223 262 282 246  --  214  --    Recent Labs  Lab 09/08/19 0654 09/09/19 0320 09/10/19 0315 09/11/19 0358 09/12/19 0409  NA 135 136 137 140 135  K 3.9 3.9 4.0 3.9 3.9  CL 99 99 102 106 103  CO2 26 26 26 26 24   GLUCOSE 98 96 105* 98 119*  BUN 11 13 16 15 13   CREATININE 1.39* 1.27* 1.30* 1.25* 1.35*  CALCIUM 8.5* 8.6* 8.6* 8.5* 8.1*    Alyssa Croak, MD  [facility] 09/13/2019

## 2019-09-13 NOTE — Progress Notes (Signed)
Physical Therapy Treatment Patient Details Name: Alyssa Odonnell MRN: [id number] DOB: Feb 18, 1934 Today's Date: 09/13/2019    History of Present Illness Pt is an 85 y.o. female admitted 08/28/19 with worsening epigastric pain. Worked up for large hiatal hernia with gastric outlet obstruction. S/p laparoscopic gastropexy with placement of gastrostomy tube with extensive lysis11/13. PMH includes afib, stroke, HTN, glaucoma. During admission, pt developed acute ischemia of left fourth and fifth toe and is now s/p arteriogram with stent placement 11-23, and has since had 2 units of blood trasnfused (11/24 and 11/25).    PT Comments    Pt in bed upon PT arrival, agreeable to PT session now that pain in L foot is better controlled. Pt was able to demo good bed mobility with stable vital signs through session with only minA for bed mobility and transfers. Pt was able to demo sig improvements in ambulation tolerance (30 ft with RW and min guard), but continues to demo impaired gait pattern with slow cadence and poor acceptance of weight onto LLE due to pain. Pt will continue to benefit from skilled PT to address functional mobility and endurance.    Follow Up Recommendations  SNF;Supervision for mobility/OOB     Equipment Recommendations  None recommended by PT    Recommendations for Other Services       Precautions / Restrictions Precautions Precautions: Fall Precaution Comments: g tube Restrictions Weight Bearing Restrictions: No    Mobility  Bed Mobility Overal bed mobility: Needs Assistance Bed Mobility: Supine to Sit     Supine to sit: HOB elevated;Min assist     General bed mobility comments: assist to elevate trunk into sitting , pt able to move BLE with increased time.  Transfers Overall transfer level: Needs assistance Equipment used: Rolling walker (2 wheeled) Transfers: Sit to/from [facility] Sit to Stand: Min assist Stand pivot transfers: Min assist        General transfer comment: assist required to power up into standing and to steady while pivoting; flexed trunk  Ambulation/Gait Ambulation/Gait assistance: Min guard;Min assist Gait Distance (Feet): 30 Feet Assistive device: Rolling walker (2 wheeled) Gait Pattern/deviations: Step-through pattern;Decreased stride length;Decreased stance time - left;Shuffle;Trunk flexed;Antalgic Gait velocity: Decreased Gait velocity interpretation: <1.31 ft/sec, indicative of household ambulator General Gait Details: pt ambulates with sig trunk flexion and antalgic gait due to ongoing pain in L foot   Stairs             Wheelchair Mobility    Modified Rankin (Stroke Patients Only)       Balance Overall balance assessment: Needs assistance Sitting-balance support: Feet supported Sitting balance-Leahy Scale: Fair Sitting balance - Comments: supervision with feet supported   Standing balance support: Bilateral upper extremity supported;During functional activity Standing balance-Leahy Scale: Poor Standing balance comment: Reliant on UE support                            Cognition Arousal/Alertness: Awake/alert Behavior During Therapy: WFL for tasks assessed/performed Overall Cognitive Status: Within Functional Limits for tasks assessed                                 General Comments: WFL for simple tasks, demonstrates some short-term memory deficits and poor attention, likely baseline cognition      Exercises      General Comments        Pertinent Vitals/Pain Pain  Assessment: Faces Faces Pain Scale: Hurts little more Pain Location: knees and L toes Pain Descriptors / Indicators: Sore;Grimacing;Guarding Pain Intervention(s): Limited activity within patient's tolerance;Monitored during session;Repositioned    Home Living                      Prior Function            PT Goals (current goals can now be found in the care plan  section) Acute Rehab PT Goals Patient Stated Goal: Agreeable to rehab before return home since daughter cannot assist PT Goal Formulation: With patient Time For Goal Achievement: 09/27/19 Potential to Achieve Goals: Good Progress towards PT goals: Progressing toward goals    Frequency    Min 2X/week      PT Plan Current plan remains appropriate    Co-evaluation              AM-PAC PT "6 Clicks" Mobility   Outcome Measure  Help needed turning from your back to your side while in a flat bed without using bedrails?: A Little Help needed moving from lying on your back to sitting on the side of a flat bed without using bedrails?: A Little Help needed moving to and from a bed to a chair (including a wheelchair)?: A Little Help needed standing up from a chair using your arms (e.g., wheelchair or bedside chair)?: A Little Help needed to walk in hospital room?: A Little Help needed climbing 3-5 steps with a railing? : A Lot 6 Click Score: 17    End of Session Equipment Utilized During Treatment: Gait belt;Oxygen(2L O2) Activity Tolerance: Patient tolerated treatment well Patient left: with call bell/phone within reach;in chair;with chair alarm set Nurse Communication: Mobility status PT Visit Diagnosis: Other abnormalities of gait and mobility (R26.89);Pain Pain - Right/Left: Left Pain - part of body: Ankle and joints of foot     Time: 1459-1534 PT Time Calculation (min) (ACUTE ONLY): 35 min  Charges:  $Gait Training: 23-37 mins                     Mickey Farber, PT, DPT   Acute Rehabilitation Department [phone]   Otho Bellows 09/13/2019, 3:53 PM

## 2019-09-13 NOTE — Progress Notes (Signed)
   She is progressing well from vascular surgical standpoint.  Right thigh hematoma is stable and soft.  Left foot warmer and pain is much improved.  There is no Doppler for evaluation today but given improved symptoms no need for further evaluation at this time.  I will have her follow-up in 6 weeks with left lower extremity duplex and ABIs.  If there are issues over the weekend please contact vascular surgeon on-call I will otherwise see her again Monday, November 30 if she is still inpatient.  Sheriff Rodenberg C. Donzetta Matters, MD [facility] Office: [phone] Pager: [phone]

## 2019-09-13 NOTE — Progress Notes (Signed)
Lorra Hals ( hospitalist on call) updated the result oc CBC this am.

## 2019-09-13 NOTE — Discharge Instructions (Signed)
[facility]: POST OP INSTRUCTIONS Always review your discharge instruction sheet given to you by the facility where your surgery was performed. IF YOU HAVE DISABILITY OR FAMILY LEAVE FORMS, YOU MUST BRING THEM TO THE OFFICE FOR PROCESSING.   DO NOT GIVE THEM TO YOUR DOCTOR.  PAIN CONTROL  1. First take acetaminophen (Tylenol) AND/or ibuprofen (Advil) to control your pain after surgery.  Follow directions on package.  Taking acetaminophen (Tylenol) and/or ibuprofen (Advil) regularly after surgery will help to control your pain and lower the amount of prescription pain medication you may need.  You should not take more than 3,000 mg (3 grams) of acetaminophen (Tylenol) in 24 hours.  You should not take ibuprofen (Advil), aleve, motrin, naprosyn or other NSAIDS if you have a history of stomach ulcers or chronic kidney disease.  2. A prescription for pain medication may be given to you upon discharge.  Take your pain medication as prescribed, if you still have uncontrolled pain after taking acetaminophen (Tylenol) or ibuprofen (Advil). 3. Use ice packs to help control pain. 4. If you need a refill on your pain medication, please contact your pharmacy.  They will contact our office to request authorization. Prescriptions will not be filled after 5pm or on week-ends.  HOME MEDICATIONS 5. Take your usually prescribed medications unless otherwise directed.  DIET 6. You should follow a light diet the first few days after arrival home.  Be sure to include lots of fluids daily. Avoid fatty, fried foods.   CONSTIPATION 7. It is common to experience some constipation after surgery and if you are taking pain medication.  Increasing fluid intake and taking a stool softener (such as Colace) will usually help or prevent this problem from occurring.  A mild laxative (Milk of Magnesia or Miralax) should be taken according to package instructions if there are no bowel  movements after 48 hours.  WOUND/INCISION CARE 8. Most patients will experience some swelling and bruising in the area of the incisions.  Ice packs will help.  Swelling and bruising can take several days to resolve.  9. Unless discharge instructions indicate otherwise, follow guidelines below  a. STERI-STRIPS - you may remove your outer bandages 48 hours after surgery, and you may shower at that time.  You have steri-strips (small skin tapes) in place directly over the incision.  These strips should be left on the skin for 7-10 days.   b. DERMABOND/SKIN GLUE - you may shower in 24 hours.  The glue will flake off over the next 2-3 weeks. 10. Any sutures or staples will be removed at the office during your follow-up visit.  ACTIVITIES 11. You may resume regular (light) daily activities beginning the next day--such as daily self-care, walking, climbing stairs--gradually increasing activities as tolerated.  You may have sexual intercourse when it is comfortable.  Refrain from any heavy lifting or straining until approved by your doctor. a. You may drive when you are no longer taking prescription pain medication, you can comfortably wear a seatbelt, and you can safely maneuver your car and apply brakes.  FOLLOW-UP 12. You should see your doctor in the office for a follow-up appointment approximately 2-3 weeks after your surgery.  You should have been given your post-op/follow-up appointment when your surgery was scheduled.  If you did not receive a post-op/follow-up appointment, make sure that you call for this appointment within a day or two after you arrive home to insure a convenient appointment time.  WHEN TO CALL YOUR DOCTOR: 1. Fever over 101.0 2. Inability to urinate 3. Continued bleeding from incision. 4. Increased pain, redness, or drainage from the incision. 5. Increasing abdominal pain  The clinic staff is available to answer your questions during regular business hours.  Please dont  hesitate to call and ask to speak to one of the nurses for clinical concerns.  If you have a medical emergency, go to the nearest emergency room or call 911.  A surgeon from [facility] is always on call at the hospital. [address], [place], [place], [place]  [address] ? [address], [place], [place]   [address] [phone] ? [phone] ? FAX [phone] Web site: [email or web address]  Gastrostomy Tube Home Guide, Adult A gastrostomy tube, or G-tube, is a tube that is inserted through the abdomen into the stomach. The tube is used to give feedings and medicines when a person is unable to eat and drink enough on his or her own. How to care for a G-tube Supplies needed  Saline solution or clean, warm water and soap.  Cotton swab or gauze.  Precut gauze bandage (dressing) and tape, if needed. Instructions 1. Wash your hands with soap and water. 2. If there is a dressing between the person's skin and the tube, remove it. 3. Check the area where the tube enters the skin. Check for problems such as: ? Redness. ? Swelling. ? Pus-like drainage. ? Extra skin growth. 4. Moisten the cotton swab with the saline solution or soap and water mixture. Gently clean around the insertion site. Remove any drainage or crusting. ? When the G-tube is first put in, a normal saline solution or water can be used to clean the skin. ? Mild soap and warm water can be used when the skin around the G-tube site has healed. 5. If there should be a dressing between the person's skin and the tube, apply it at this time. How to flush a G-tube Flush the G-tube regularly to keep it from clogging. Flush it before and after feedings and as often as told by the health care provider. Supplies needed  Purified or sterile water, warmed. If the person has a weak disease-fighting (immune) system, or if he or she has difficulty fighting off infections (is immunocompromised), use only sterile  water. ? If you are unsure about the amount of chemical contaminants in purified or drinking water, use sterile water. ? To purify drinking water by boiling:  Boil water for at least 1 minute. Keep lid over water while it boils. Allow water to cool to room temperature before using.  60cc G-tube syringe. Instructions 1. Wash your hands with soap and water. 2. Draw up 30 mL of warm water in a syringe. 3. Connect the syringe to the tube. 4. Slowly and gently push the water into the tube. G-tube problems and solutions  If the tube comes out: ? Cover the opening with a clean dressing and tape. ? Call a health care provider right away. ? A health care provider will need to put the tube back in within 4 hours.  If there is skin or scar tissue growing where the tube enters the skin: ? Keep the area clean and dry. ? Secure the tube with tape so that the tube does not move around too much. ? Call a health care provider.  If the tube gets clogged: ? Slowly push warm water into the tube with a large syringe. ? Do not force  the fluid into the tube or push an object into the tube. ? If you are not able to unclog the tube, call a health care provider right away. Follow these instructions at home: Feedings  Give feedings at room temperature.  Cover and place unused feedings in the refrigerator.  If feedings are continuous: ? Do not put more than 4 hours worth of feedings in the feeding bag. ? Stop the feedings when you need to give medicine or flush the tube. Be sure to restart the feedings. ? Make sure the person's head is above his or her stomach (upright position). This will prevent choking and discomfort.  Replace feeding bags and syringes as told by the health care provider.  Make sure the person is in the right position during and after feedings: ? During feedings, the person's position should be in the upright position. ? After a noncontinuous feeding (bolus feeding), have the  person stay in the upright position for 1 hour. General instructions  Only use syringes made for G-tubes.  Do not pull or put tension on the tube.  Clamp the tube before removing the cap or disconnecting a syringe.  Measure the length of the G-tube every day from the insertion site to the end of the tube.  If the person's G-tube has a balloon, check the fluid in the balloon every week. The amount of fluid that should be in the balloon can be found in the manufacturers specifications.  Make sure the person takes care of his or her oral health, such as by brushing his or her teeth.  Remove excess air from the G-tube as told by the person's health care provider. This is called "venting."  Keep the area where the tube enters the skin clean and dry.  Do not push feedings, medicines, or flushes rapidly. Contact a health care provider if:  The person with the tube has any of these problems: ? Constipation. ? Fever.  There is a large amount of fluid or mucus-like liquid leaking from the tube.  Skin or scar tissue appears to be growing where the tube enters the skin.  The length of tube from the insertion site to the G-tube gets longer. Get help right away if:  The person with the tube has any of these problems: ? Severe abdominal pain. ? Severe tenderness. ? Severe bloating. ? Nausea. ? Vomiting. ? Trouble breathing. ? Shortness of breath.  Any of these problems happen in the area where the tube enters the skin: ? Redness, irritation, swelling, or soreness. ? Pus-like discharge. ? A bad smell.  The tube is clogged and cannot be flushed.  The tube comes out. Summary  A gastrostomy tube, or G-tube, is a tube that is inserted through the abdomen into the stomach. The tube is used to give feedings and medicines when a person is unable to eat and drink enough on his or her own.  Check and clean the insertion site daily as told by the person's health care provider.  Flush  the G-tube regularly to keep it from clogging. Flush it before and after feedings and as often as told by the person's health care provider.  Keep the area where the tube enters the skin clean and dry. This information is not intended to replace advice given to you by your health care provider. Make sure you discuss any questions you have with your health care provider. Document Released: 12/14/2001 Document Revised: 09/17/2017 Document Reviewed: 11/30/2016 Elsevier Patient Education  2020  [place] on my medicine - ELIQUIS (apixaban)  This medication education was reviewed with me or my healthcare representative as part of my discharge preparation.  The pharmacist that spoke with me during my hospital stay was:  Einar Grad, [facility]  Why was Eliquis prescribed for you? Eliquis was prescribed for you to reduce the risk of a blood clot forming that can cause a stroke if you have a medical condition called atrial fibrillation (a type of irregular heartbeat).  What do You need to know about Eliquis ? Take your Eliquis TWICE DAILY - one tablet in the morning and one tablet in the evening with or without food. If you have difficulty swallowing the tablet whole please discuss with your pharmacist how to take the medication safely.  Take Eliquis exactly as prescribed by your doctor and DO NOT stop taking Eliquis without talking to the doctor who prescribed the medication.  Stopping may increase your risk of developing a stroke.  Refill your prescription before you run out.  After discharge, you should have regular check-up appointments with your healthcare provider that is prescribing your Eliquis.  In the future your dose may need to be changed if your kidney function or weight changes by a significant amount or as you get older.  What do you do if you miss a dose? If you miss a dose, take it as soon as you remember on the same day and resume taking twice daily.  Do not  take more than one dose of ELIQUIS at the same time to make up a missed dose.  Important Safety Information A possible side effect of Eliquis is bleeding. You should call your healthcare provider right away if you experience any of the following: ? Bleeding from an injury or your nose that does not stop. ? Unusual colored urine (red or dark brown) or unusual colored stools (red or black). ? Unusual bruising for unknown reasons. ? A serious fall or if you hit your head (even if there is no bleeding).  Some medicines may interact with Eliquis and might increase your risk of bleeding or clotting while on Eliquis. To help avoid this, consult your healthcare provider or pharmacist prior to using any new prescription or non-prescription medications, including herbals, vitamins, non-steroidal anti-inflammatory drugs (NSAIDs) and supplements.  This website has more information on Eliquis (apixaban): http://www.eliquis.com/eliquis/home

## 2019-09-14 LAB — CBC WITH DIFFERENTIAL/PLATELET
Abs Immature Granulocytes: 0.13 10*3/uL — ABNORMAL HIGH (ref 0.00–0.07)
Basophils Absolute: 0 10*3/uL (ref 0.0–0.1)
Basophils Relative: 0 %
Eosinophils Absolute: 0.1 10*3/uL (ref 0.0–0.5)
Eosinophils Relative: 1 %
HCT: 27.6 % — ABNORMAL LOW (ref 36.0–46.0)
Hemoglobin: 9.2 g/dL — ABNORMAL LOW (ref 12.0–15.0)
Immature Granulocytes: 1 %
Lymphocytes Relative: 16 %
Lymphs Abs: 1.6 10*3/uL (ref 0.7–4.0)
MCH: 30.4 pg (ref 26.0–34.0)
MCHC: 33.3 g/dL (ref 30.0–36.0)
MCV: 91.1 fL (ref 80.0–100.0)
Monocytes Absolute: 1.1 10*3/uL — ABNORMAL HIGH (ref 0.1–1.0)
Monocytes Relative: 11 %
Neutro Abs: 7.1 10*3/uL (ref 1.7–7.7)
Neutrophils Relative %: 71 %
Platelets: 210 10*3/uL (ref 150–400)
RBC: 3.03 MIL/uL — ABNORMAL LOW (ref 3.87–5.11)
RDW: 13.8 % (ref 11.5–15.5)
WBC: 10 10*3/uL (ref 4.0–10.5)
nRBC: 0 % (ref 0.0–0.2)

## 2019-09-14 LAB — BPAM RBC
Blood Product Expiration Date: 202012262359
Blood Product Expiration Date: 202012272359
ISSUE DATE / TIME: 202011241029
ISSUE DATE / TIME: 202011251122
Unit Type and Rh: 5100
Unit Type and Rh: 5100

## 2019-09-14 LAB — TYPE AND SCREEN
ABO/RH(D): O POS
Antibody Screen: NEGATIVE
Unit division: 0
Unit division: 0

## 2019-09-14 LAB — BASIC METABOLIC PANEL
Anion gap: 9 (ref 5–15)
BUN: 10 mg/dL (ref 8–23)
CO2: 26 mmol/L (ref 22–32)
Calcium: 8.4 mg/dL — ABNORMAL LOW (ref 8.9–10.3)
Chloride: 103 mmol/L (ref 98–111)
Creatinine, Ser: 1.49 mg/dL — ABNORMAL HIGH (ref 0.44–1.00)
GFR calc Af Amer: 37 mL/min — ABNORMAL LOW (ref >60)
GFR calc non Af Amer: 32 mL/min — ABNORMAL LOW (ref >60)
Glucose, Bld: 109 mg/dL — ABNORMAL HIGH (ref 70–99)
Potassium: 4.3 mmol/L (ref 3.5–5.1)
Sodium: 138 mmol/L (ref 135–145)

## 2019-09-14 LAB — GLUCOSE, CAPILLARY
Glucose-Capillary: 99 mg/dL (ref 70–99)
Glucose-Capillary: 103 mg/dL — ABNORMAL HIGH (ref 70–99)
Glucose-Capillary: 112 mg/dL — ABNORMAL HIGH (ref 70–99)

## 2019-09-14 LAB — PHOSPHORUS: Phosphorus: 4.3 mg/dL (ref 2.5–4.6)

## 2019-09-14 MED ORDER — LIDOCAINE 5 % EX PTCH
1.0000 | MEDICATED_PATCH | CUTANEOUS | Status: DC
  Administered 2019-09-14 – 2019-09-25 (×12): 1 via TRANSDERMAL
  Filled 2019-09-14 (×12): qty 1

## 2019-09-14 MED ORDER — SODIUM CHLORIDE 0.9 % IV SOLN
INTRAVENOUS | Status: DC
  Administered 2019-09-14 – 2019-09-16 (×2): via INTRAVENOUS

## 2019-09-14 NOTE — Progress Notes (Signed)
PROGRESS NOTE  Alyssa Odonnell  DOB: Oct 12, 1934  PCP: Drake Leach, MD SI:[id number]  DOA: 08/28/2019  LOS: 16 days   Chief Complaint  Patient presents with  . 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  Patient presented to the ED on 11/9 with complaint of sudden onset severe nausea, dry heaving without vomiting, chest epigastric abdominal pain.   CT abdomen showed large hiatal hernia with gastric volvulus with gastric outlet obstruction. Multiple unsuccessful attempts to pass NG tube at bedside as well as under fluoroscopy.  General surgery consulted. 11/13, patient underwent laparoscopic gastropexy with placement of a 20 French gastrostomy tube with extensive lysis of adhesion. Prolonged but good postoperative outcome and is now on oral appetite and hydration. During this hospitalization, patient also underwent left lower extremity arteriogram and stenting.  Subjective: Patient was seen and examined this afternoon.   Patient is in pain today in her right hip area.  She is not feeling better despite changing positions.  Anterior hematoma site looks stable.  Pain seem to be deep-seated on palpation of the muscles laterally  Assessment/Plan: Large hiatal hernia with gastric outlet obstruction  -Presented with sudden onset nausea, chest/epigastric abdominal pain  -CT scan showed large hiatal hernia with gastric outlet obstruction -11/13, patient underwent laparoscopic gastropexy with placement of a 20 French gastrostomy tube with extensive lysis of adhesion. -Per general surgery patient will require GT for 6 weeks. -Patient complains of feeling full with appetite. Encouraged to take frequent small meals.  Acute ischemia of left fourth and fifth toe Peripheral artery disease -During this hospitalization, patient complained of pain and  cyanosis of the left fourth and fifth toe.  There was a concern of ischemia.  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 -ABI; resting right and left ankle brachial index indicate moderate right lower extremity arterial disease. -Vascular was consulted.  -s/p arteriogram 11-23. stent of left SFA with distal 5 x 25 cm Viabahn on proximal 5 x 10 cm Viabahn, plain balloon angioplasty of left tibioperoneal trunk with 3 mm balloon. -Vascular recommended plavix.  Patient is already on Eliquis as well.  Acute blood loss anemia; groin hematoma post arteriogram.  Anemia of chronic disease -11/24, 1 unit of PRBC transfused.  Hemoglobin was low at 7.3 yesterday.  1 unit PRBC given.  Hemoglobin improved to 9.9 yesterday, 9.2 today.  Repeat tomorrow.    Right hip pain -Hematoma site intact.  Seems to have pain laterally in the muscles.  Suspect bursitis.  Lidocaine patch offered.  Continue pain medicine.  Hypokalemia: Resolved.   Paroxysmal A. Fib On amiodarone and Eliquis.  Hypertension: -Blood pressure stable. Benicar and hydrochlorothiazide remain on hold.   Chronic kidney disease a stage IIIb: Monitor kidney function daily, slightly elevated 1.49 today.  Resume IV fluid.  Hypothyroidism: Continue with Synthroid  Elevated TSH; - Free T 3  Low, and T4 high. She will need repeat thyroid function test in 4 weeks.   Mobility: Increase ambulation. PT will see her today again. Diet: Regular diet Fluid: I would avoid IV hydration and encourage oral hydration. DVT prophylaxis:  Eliquis Code Status:   Code Status: Full Code  Family Communication:  Not at bedside Expected Discharge:  Needs SNF at discharge.  Consultants:    Procedures:    Antimicrobials: Anti-infectives (From admission,  onward)   Start     Dose/Rate Route Frequency Ordered Stop   09/01/19 1400  ciprofloxacin (CIPRO) IVPB 400 mg     400 mg 200 mL/hr over 60 Minutes  Intravenous On call to O.R. 09/01/19 0738 09/01/19 1615      Diet Order            Diet regular Room service appropriate? Yes; Fluid consistency: Thin  Diet effective now              Infusions:  . sodium chloride      Scheduled Meds: . amiodarone  200 mg Oral Daily  . apixaban  5 mg Oral BID  . chlorhexidine  15 mL Mouth Rinse BID  . clopidogrel  75 mg Oral Q breakfast  . docusate sodium  100 mg Oral Daily  . feeding supplement (ENSURE ENLIVE)  237 mL Oral BID BM  . latanoprost  1 drop Both Eyes QHS  . levothyroxine  100 mcg Oral Q0600  . lidocaine  1 patch Transdermal Q24H  . mouth rinse  15 mL Mouth Rinse q12n4p  . pantoprazole  40 mg Oral Daily  . polyethylene glycol  17 g Oral BID  . sodium chloride flush  3 mL Intravenous Q12H    PRN meds: sodium chloride, acetaminophen, bisacodyl, Glycerin (Adult), hydrALAZINE, hydrALAZINE, labetalol, methocarbamol, morphine injection, ondansetron (ZOFRAN) IV, oxyCODONE, promethazine, sodium chloride flush, traMADol, white petrolatum   Objective: Vitals:   09/14/19 0446 09/14/19 0447  BP: 127/66 127/66  Pulse: 88 85  Resp:    Temp: 98.3 F (36.8 C) 98.3 F (36.8 C)  SpO2: 99% 98%    Intake/Output Summary (Last 24 hours) at 09/14/2019 1419 Last data filed at 09/14/2019 0848 Gross per 24 hour  Intake 123 ml  Output 300 ml  Net -177 ml   Filed Weights   09/12/19 0601 09/13/19 0422 09/14/19 0447  Weight: 76.4 kg 76.9 kg 75.9 kg   Weight change: -1 kg Body mass index is 29.64 kg/m.   Physical Exam: General exam: Uncomfortable today because of right knee pain. Skin: No rashes, lesions or ulcers. HEENT: Atraumatic, normocephalic, supple neck, no obvious bleeding Lungs: Clear to auscultation bilaterally CVS: Regular rate and rhythm, no murmur GI/Abd soft, mild appropriate tenderness postsurgical, bowel sound present CNS: Alert, awake, alert x3 Psychiatry: Mood appropriate Extremities: No pedal edema, no calf  tenderness.  Mild tenderness present on the lateral aspect of the right hip.  Data Review: I have personally reviewed the laboratory data and studies available.  Recent Labs  Lab 09/10/19 0315 09/11/19 0358 09/12/19 0409 09/12/19 2158 09/13/19 0339 09/13/19 0542 09/13/19 1734 09/14/19 0313  WBC 9.1 8.6 11.1*  --  9.3  --   --  10.0  NEUTROABS 5.7 5.0 8.0*  --  6.6  --   --  7.1  HGB 8.9* 9.3* 7.2* 8.0* 7.2* 7.3* 9.9* 9.2*  HCT 28.5* 29.3* 22.7* 24.1* 22.6* 22.7* 29.8* 27.6*  MCV 94.4 95.4 93.8  --  92.2  --   --  91.1  PLT 262 282 246  --  214  --   --  210   Recent Labs  Lab 09/09/19 0320 09/10/19 0315 09/11/19 0358 09/12/19 0409 09/14/19 0313  NA 136 137 140 135 138  K 3.9 4.0 3.9 3.9 4.3  CL 99 102 106 103 103  CO2 26 26 26 24 26   GLUCOSE 96 105* 98 119* 109*  BUN 13 16 15 13 10   CREATININE 1.27*  1.30* 1.25* 1.35* 1.49*  CALCIUM 8.6* 8.6* 8.5* 8.1* 8.4*  PHOS  --   --   --   --  4.3    Terrilee Croak, MD  [facility] 09/14/2019

## 2019-09-15 LAB — CBC WITH DIFFERENTIAL/PLATELET
Abs Immature Granulocytes: 0.09 10*3/uL — ABNORMAL HIGH (ref 0.00–0.07)
Basophils Absolute: 0 10*3/uL (ref 0.0–0.1)
Basophils Relative: 0 %
Eosinophils Absolute: 0.1 10*3/uL (ref 0.0–0.5)
Eosinophils Relative: 1 %
HCT: 26.6 % — ABNORMAL LOW (ref 36.0–46.0)
Hemoglobin: 8.7 g/dL — ABNORMAL LOW (ref 12.0–15.0)
Immature Granulocytes: 1 %
Lymphocytes Relative: 15 %
Lymphs Abs: 1.5 10*3/uL (ref 0.7–4.0)
MCH: 30 pg (ref 26.0–34.0)
MCHC: 32.7 g/dL (ref 30.0–36.0)
MCV: 91.7 fL (ref 80.0–100.0)
Monocytes Absolute: 1 10*3/uL (ref 0.1–1.0)
Monocytes Relative: 10 %
Neutro Abs: 7 10*3/uL (ref 1.7–7.7)
Neutrophils Relative %: 73 %
Platelets: 228 10*3/uL (ref 150–400)
RBC: 2.9 MIL/uL — ABNORMAL LOW (ref 3.87–5.11)
RDW: 13.7 % (ref 11.5–15.5)
WBC: 9.7 10*3/uL (ref 4.0–10.5)
nRBC: 0 % (ref 0.0–0.2)

## 2019-09-15 LAB — BASIC METABOLIC PANEL
Anion gap: 12 (ref 5–15)
BUN: 12 mg/dL (ref 8–23)
CO2: 23 mmol/L (ref 22–32)
Calcium: 8.2 mg/dL — ABNORMAL LOW (ref 8.9–10.3)
Chloride: 102 mmol/L (ref 98–111)
Creatinine, Ser: 1.23 mg/dL — ABNORMAL HIGH (ref 0.44–1.00)
GFR calc Af Amer: 47 mL/min — ABNORMAL LOW (ref >60)
GFR calc non Af Amer: 41 mL/min — ABNORMAL LOW (ref >60)
Glucose, Bld: 114 mg/dL — ABNORMAL HIGH (ref 70–99)
Potassium: 3.7 mmol/L (ref 3.5–5.1)
Sodium: 137 mmol/L (ref 135–145)

## 2019-09-15 LAB — GLUCOSE, CAPILLARY
Glucose-Capillary: 100 mg/dL — ABNORMAL HIGH (ref 70–99)
Glucose-Capillary: 120 mg/dL — ABNORMAL HIGH (ref 70–99)

## 2019-09-15 MED ORDER — CYCLOBENZAPRINE HCL 10 MG PO TABS
5.0000 mg | ORAL_TABLET | Freq: Three times a day (TID) | ORAL | Status: DC
  Administered 2019-09-15 (×2): 5 mg via ORAL
  Filled 2019-09-15 (×3): qty 1

## 2019-09-15 NOTE — Progress Notes (Signed)
PROGRESS NOTE  Alyssa Odonnell  DOB: 04-06-1934  PCP: Drake Leach, MD [id number]  DOA: 08/28/2019  LOS: 17 days   Chief Complaint  Patient presents with  . 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  Patient presented to the ED on 11/9 with complaint of sudden onset severe nausea, dry heaving without vomiting, chest epigastric abdominal pain.   CT abdomen showed large hiatal hernia with gastric volvulus with gastric outlet obstruction. Multiple unsuccessful attempts to pass NG tube at bedside as well as under fluoroscopy.  General surgery consulted. 11/13, patient underwent laparoscopic gastropexy with placement of a 20 French gastrostomy tube with extensive lysis of adhesion. Prolonged but good postoperative outcome and is now on oral appetite and hydration. During this hospitalization, patient also underwent left lower extremity arteriogram and stenting.  Subjective: Patient was seen and examined this morning.  Propped up in bed.  Feels better on the right hip after application of lidocaine patch yesterday.  Still having intermittent exacerbation of pain and requiring IV morphine with very infrequently.  Hemodynamically stable.  Labs from this morning noted creatinine improving, 1.23 today, hemoglobin down to 8.7.  No episodes of active bleeding.  Groin hematoma site looks intact.  On Eliquis and Protonix.  Assessment/Plan: Large hiatal hernia with gastric outlet obstruction  -Presented with sudden onset nausea, chest/epigastric abdominal pain  -CT scan showed large hiatal hernia with gastric outlet obstruction -11/13, patient underwent laparoscopic gastropexy with placement of a 48 French gastrostomy tube with extensive lysis of adhesion. -Per general surgery patient will require GT for 6 weeks. -Patient complains of feeling full  with appetite. Encouraged to take frequent small meals.  Acute ischemia of left fourth and fifth toe Peripheral artery disease -During this hospitalization, patient complained of pain and cyanosis of the left fourth and fifth toe.  There was a concern of ischemia.  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 -ABI; resting right and left ABI indicate moderate right lower extremity arterial disease. -Vascular surgery was consulted.  -s/p arteriogram 11-23. stent of left SFA with distal 5 x 25 cm Viabahn on proximal 5 x 10 cm Viabahn, plain balloon angioplasty of left tibioperoneal trunk with 3 mm balloon. -Vascular recommended plavix.  Patient is already on Eliquis as well.  Acute blood loss anemia; groin hematoma post arteriogram.  Anemia of chronic disease -11/24, 1 unit of PRBC transfused.  11/26, hemoglobin low at 7.3.  Secondary to PRBC given.  Hemoglobin improved to 9.9 but slowly drifting down again, 8.7 today.  No evidence of active bleeding.  Groin hematoma site looks intact.  On Eliquis and Protonix.    Right hip pain - suspect bursitis. -Started complaining from 11/26.  Groin hematoma site intact.  Feels better today with application of lidocaine patch.  Still has episodes of spasm requiring IV morphine.  I started the patient on Flexeril scheduled for next few days with holding parameter.   Hypokalemia: Resolved.   Paroxysmal A. Fib On amiodarone and Eliquis.  Hypertension: -Blood pressure stable. Benicar and hydrochlorothiazide remain on hold.   Chronic kidney disease a stage IIIb:  Baseline creatinine less than 1.3-1.5.  He was a poor oral appetite, patient is on IV hydration with 75 mils per hour of normal saline.  Continue the same.  Creatinine improved, 1.23 today.  Continue to monitor.  Hypothyroidism: Continue with Synthroid  Elevated TSH; - Free T 3  Low, and T4 high. She will need repeat thyroid function test in 4  weeks.   Mobility: Encourage ambulation.  PT following. Diet: Regular diet Fluid: Normal saline at 75 mils per hour DVT prophylaxis:  Eliquis Code Status:   Code Status: Full Code  Family Communication:  Not at bedside Expected Discharge:  Needs SNF at discharge.  Not yet medically stable for discharge.  Consultants:    Procedures:    Antimicrobials: Anti-infectives (From admission, onward)   Start     Dose/Rate Route Frequency Ordered Stop   09/01/19 1400  ciprofloxacin (CIPRO) IVPB 400 mg     400 mg 200 mL/hr over 60 Minutes Intravenous On call to O.R. 09/01/19 0738 09/01/19 1615      Diet Order            Diet regular Room service appropriate? Yes; Fluid consistency: Thin  Diet effective now              Infusions:  . sodium chloride    . sodium chloride 75 mL/hr at 09/14/19 2101    Scheduled Meds: . amiodarone  200 mg Oral Daily  . apixaban  5 mg Oral BID  . chlorhexidine  15 mL Mouth Rinse BID  . clopidogrel  75 mg Oral Q breakfast  . cyclobenzaprine  5 mg Oral TID  . docusate sodium  100 mg Oral Daily  . feeding supplement (ENSURE ENLIVE)  237 mL Oral BID BM  . latanoprost  1 drop Both Eyes QHS  . levothyroxine  100 mcg Oral Q0600  . lidocaine  1 patch Transdermal Q24H  . mouth rinse  15 mL Mouth Rinse q12n4p  . pantoprazole  40 mg Oral Daily  . polyethylene glycol  17 g Oral BID  . sodium chloride flush  3 mL Intravenous Q12H    PRN meds: sodium chloride, acetaminophen, bisacodyl, Glycerin (Adult), hydrALAZINE, hydrALAZINE, labetalol, methocarbamol, morphine injection, ondansetron (ZOFRAN) IV, oxyCODONE, promethazine, sodium chloride flush, traMADol, white petrolatum   Objective: Vitals:   09/15/19 0209 09/15/19 0511  BP:  109/61  Pulse: 90 86  Resp: 20 (!) 21  Temp:  98.5 F (36.9 C)  SpO2: 99% 98%    Intake/Output Summary (Last 24 hours) at 09/15/2019 1010 Last data filed at 09/15/2019 0500 Gross per 24 hour  Intake 716.48 ml   Output 1000 ml  Net -283.52 ml   Filed Weights   09/13/19 0422 09/14/19 0447 09/15/19 0511  Weight: 76.9 kg 75.9 kg 76 kg   Weight change: 0.1 kg Body mass index is 29.68 kg/m.   Physical Exam: General exam: Propped up in bed.  Not in distress.  Right hip pain improving. Skin: No rashes, lesions or ulcers. HEENT: Atraumatic, normocephalic, supple neck, no obvious bleeding Lungs: Clear to auscultation bilaterally CVS: Regular rate and rhythm, no murmur GI/Abd soft, mild appropriate tenderness postsurgical, bowel sound present CNS: Alert, awake, alert x3 Psychiatry: Mood appropriate Extremities: No pedal edema, no calf tenderness.  Mild tenderness continues to remain on the lateral aspect of the right hip.  But overall improved than yesterday.  Data Review: I have personally reviewed the laboratory data and studies available.  Recent Labs  Lab 09/11/19 0358 09/12/19 0409  09/13/19 0339 09/13/19 0542 09/13/19 1734 09/14/19 0313 09/15/19 0333  WBC 8.6 11.1*  --  9.3  --   --  10.0 9.7  NEUTROABS 5.0 8.0*  --  6.6  --   --  7.1 7.0  HGB 9.3* 7.2*   < > 7.2* 7.3* 9.9* 9.2* 8.7*  HCT 29.3* 22.7*   < > 22.6* 22.7* 29.8* 27.6* 26.6*  MCV 95.4 93.8  --  92.2  --   --  91.1 91.7  PLT 282 246  --  214  --   --  210 228   < > = values in this interval not displayed.   Recent Labs  Lab 09/10/19 0315 09/11/19 0358 09/12/19 0409 09/14/19 0313 09/15/19 0333  NA 137 140 135 138 137  K 4.0 3.9 3.9 4.3 3.7  CL 102 106 103 103 102  CO2 26 26 24 26 23   GLUCOSE 105* 98 119* 109* 114*  BUN 16 15 13 10 12   CREATININE 1.30* 1.25* 1.35* 1.49* 1.23*  CALCIUM 8.6* 8.5* 8.1* 8.4* 8.2*  PHOS  --   --   --  4.3  --     Terrilee Croak, MD  [facility] 09/15/2019

## 2019-09-15 NOTE — Progress Notes (Signed)
PT Cancellation Note  Patient Details Name: Alyssa Odonnell MRN: [id number] DOB: January 18, 1934   Cancelled Treatment:    Reason Eval/Treat Not Completed: Patient declined, no reason specified. PT attempted x2, pt reports she is too tired from a night of poor sleep and in too much pain despite receiving pain meds from RN to attempt at this time. Pt educated on importance of mobility, and pt voices understanding. PT will continue to follow and treat as time/schedule allow.   Alyssa Odonnell, PT, DPT   Acute Rehabilitation Department [phone]   Alyssa Odonnell 09/15/2019, 2:11 PM

## 2019-09-15 NOTE — TOC Progression Note (Signed)
Transition of Care [facility]) - Progression Note    Patient Details  Name: Alyssa Odonnell MRN: [id number] Date of Birth: [birth year]  Transition of Care [facility]) CM/SW [place], [place] Phone Number: 09/15/2019, 5:07 PM  Clinical Narrative:    CSW unable to get in touch with admissions liaison at [name] due to holiday. At this time unable to confirm pt bed available and insurance [name] has lapsed since previous CSW submitted on 11/18. Will attempt to f/u with admissions tomorrow to assess bed availability.   TOC team continues to follow.   Expected Discharge Plan: [place] Barriers to Discharge: Continued Medical Work up  Expected Discharge Plan and Services Expected Discharge Plan: [place] In-house Referral: NA   Post Acute Care Choice: [place] Living arrangements for the past 2 months: Single Family Home   Social Determinants of Health (SDOH) Interventions    Readmission Risk Interventions No flowsheet data found.

## 2019-09-15 NOTE — Care Management Important Message (Signed)
Important Message  Patient Details  Name: Alyssa Odonnell MRN: [id number] Date of Birth: 06/26/1934   Medicare Important Message Given:  Yes     [name] 09/15/2019, 11:22 AM

## 2019-09-16 LAB — CBC WITH DIFFERENTIAL/PLATELET
Abs Immature Granulocytes: 0.06 10*3/uL (ref 0.00–0.07)
Basophils Absolute: 0 10*3/uL (ref 0.0–0.1)
Basophils Relative: 0 %
Eosinophils Absolute: 0.2 10*3/uL (ref 0.0–0.5)
Eosinophils Relative: 2 %
HCT: 27.8 % — ABNORMAL LOW (ref 36.0–46.0)
Hemoglobin: 8.9 g/dL — ABNORMAL LOW (ref 12.0–15.0)
Immature Granulocytes: 1 %
Lymphocytes Relative: 18 %
Lymphs Abs: 1.5 10*3/uL (ref 0.7–4.0)
MCH: 29.5 pg (ref 26.0–34.0)
MCHC: 32 g/dL (ref 30.0–36.0)
MCV: 92.1 fL (ref 80.0–100.0)
Monocytes Absolute: 0.9 10*3/uL (ref 0.1–1.0)
Monocytes Relative: 11 %
Neutro Abs: 5.8 10*3/uL (ref 1.7–7.7)
Neutrophils Relative %: 68 %
Platelets: 225 10*3/uL (ref 150–400)
RBC: 3.02 MIL/uL — ABNORMAL LOW (ref 3.87–5.11)
RDW: 13.4 % (ref 11.5–15.5)
WBC: 8.5 10*3/uL (ref 4.0–10.5)
nRBC: 0 % (ref 0.0–0.2)

## 2019-09-16 LAB — GLUCOSE, CAPILLARY
Glucose-Capillary: 117 mg/dL — ABNORMAL HIGH (ref 70–99)
Glucose-Capillary: 123 mg/dL — ABNORMAL HIGH (ref 70–99)
Glucose-Capillary: 80 mg/dL (ref 70–99)

## 2019-09-16 LAB — BASIC METABOLIC PANEL
Anion gap: 12 (ref 5–15)
BUN: 16 mg/dL (ref 8–23)
CO2: 24 mmol/L (ref 22–32)
Calcium: 8.4 mg/dL — ABNORMAL LOW (ref 8.9–10.3)
Chloride: 101 mmol/L (ref 98–111)
Creatinine, Ser: 1.38 mg/dL — ABNORMAL HIGH (ref 0.44–1.00)
GFR calc Af Amer: 41 mL/min — ABNORMAL LOW (ref >60)
GFR calc non Af Amer: 35 mL/min — ABNORMAL LOW (ref >60)
Glucose, Bld: 109 mg/dL — ABNORMAL HIGH (ref 70–99)
Potassium: 4.2 mmol/L (ref 3.5–5.1)
Sodium: 137 mmol/L (ref 135–145)

## 2019-09-16 MED ORDER — CYCLOBENZAPRINE HCL 10 MG PO TABS
5.0000 mg | ORAL_TABLET | Freq: Three times a day (TID) | ORAL | Status: DC
  Administered 2019-09-16 – 2019-09-26 (×30): 5 mg via ORAL
  Filled 2019-09-16 (×30): qty 1

## 2019-09-16 NOTE — Progress Notes (Signed)
PROGRESS NOTE  Alyssa Odonnell  DOB: Mar 05, 1934  PCP: Drake Leach, MD [id number]  DOA: 08/28/2019  LOS: 18 days   Chief Complaint  Patient presents with  . 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  Patient presented to the ED on 11/9 with complaint of sudden onset severe nausea, dry heaving without vomiting, chest epigastric abdominal pain.   CT abdomen showed large hiatal hernia with gastric volvulus with gastric outlet obstruction. Multiple unsuccessful attempts to pass NG tube at bedside as well as under fluoroscopy.  General surgery consulted. 11/13, patient underwent laparoscopic gastropexy with placement of a 20 French gastrostomy tube with extensive lysis of adhesion. Prolonged but good postoperative outcome and is now on oral appetite and hydration. During this hospitalization, patient also underwent left lower extremity arteriogram and stenting.  Subjective: Patient was seen and examined this morning.  Propped up in bed.  Has low-flow oxygen on.  Right hip pain improved with lidocaine patch and Flexeril.  Not been requiring IV morphine since yesterday.  Hemodynamically stable.  Creatinine slightly up to 1.3 today.  Hemoglobin stable at 8.9.   Assessment/Plan: Large hiatal hernia with gastric outlet obstruction  -Presented with sudden onset nausea, chest/epigastric abdominal pain  -CT scan showed large hiatal hernia with gastric outlet obstruction -11/13, patient underwent laparoscopic gastropexy with placement of a 89 French gastrostomy tube with extensive lysis of adhesion. -Per general surgery patient will require GT for 6 weeks. -Patient complains of feeling full with appetite. Encouraged to take frequent small meals. -Patient wants general surgery to check her objective site prior to discharge.  Consider remaining  surgery on Monday.  Acute ischemia of left fourth and fifth toe Peripheral artery disease -During this hospitalization, patient complained of pain and cyanosis of the left fourth and fifth toe.  There was a concern of ischemia.  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 -ABI; resting right and left ABI indicate moderate right lower extremity arterial disease. -Vascular surgery was consulted.  -s/p arteriogram 11-23. stent of left SFA with distal 5 x 25 cm Viabahn on proximal 5 x 10 cm Viabahn, plain balloon angioplasty of left tibioperoneal trunk with 3 mm balloon. -Vascular recommended plavix.  Patient is already on Eliquis as well. -Patient delivered right groin hematoma at the site of catheter access.  Resolved initially.  Acute blood loss anemia; groin hematoma post arteriogram.  Anemia of chronic disease -Baseline hemoglobin 11-12  -2 units of PRBCs transfused during this hospitalization, last transfusion on 11/25. -Hemoglobin stable now, 8.9 today. -No evidence of active bleeding. Groin hematoma site looks intact.  -On Eliquis and Protonix.    Right hip pain - suspect bursitis. -Started complaining from 11/26.  Improving now with lidocaine patch, and scheduled Flexeril.  Requiring less doses of IV morphine.    Hypokalemia: Resolved.   Paroxysmal A. Fib On amiodarone and Eliquis.  Hypertension: -Blood pressure stable. Benicar and hydrochlorothiazide remain on hold.   Chronic kidney disease a stage IIIb:  Baseline creatinine less than 1.3-1.5.  Because of poor appetite, patient prefers to remain on IV hydration with taper..  Reduce to normal saline at 50 ML per hour.  Monitor creatinine.  1.3 today.  Hypothyroidism: Continue with Synthroid. elevated TSH, Free T 3  Low, and T4 high. She will need repeat thyroid function  test in 4 weeks.   Mobility: Encourage ambulation.  PT following. Diet: Regular diet Fluid: Normal  saline at 50 mils per hour DVT prophylaxis:  Eliquis Code Status:   Code Status: Full Code  Family Communication:  Not at bedside Expected Discharge:  Needs SNF at discharge.  Medically stable for discharge at this time.  Case manager made aware.  Insurance authorization awaited.  Consultants:    Procedures:    Antimicrobials: Anti-infectives (From admission, onward)   Start     Dose/Rate Route Frequency Ordered Stop   09/01/19 1400  ciprofloxacin (CIPRO) IVPB 400 mg     400 mg 200 mL/hr over 60 Minutes Intravenous On call to O.R. 09/01/19 0738 09/01/19 1615      Diet Order            Diet regular Room service appropriate? Yes; Fluid consistency: Thin  Diet effective now              Infusions:  . sodium chloride    . sodium chloride 50 mL/hr at 09/16/19 [id number]    Scheduled Meds: . amiodarone  200 mg Oral Daily  . apixaban  5 mg Oral BID  . chlorhexidine  15 mL Mouth Rinse BID  . clopidogrel  75 mg Oral Q breakfast  . cyclobenzaprine  5 mg Oral TID  . docusate sodium  100 mg Oral Daily  . feeding supplement (ENSURE ENLIVE)  237 mL Oral BID BM  . latanoprost  1 drop Both Eyes QHS  . levothyroxine  100 mcg Oral Q0600  . lidocaine  1 patch Transdermal Q24H  . mouth rinse  15 mL Mouth Rinse q12n4p  . pantoprazole  40 mg Oral Daily  . polyethylene glycol  17 g Oral BID  . sodium chloride flush  3 mL Intravenous Q12H    PRN meds: sodium chloride, acetaminophen, bisacodyl, Glycerin (Adult), hydrALAZINE, hydrALAZINE, labetalol, methocarbamol, morphine injection, ondansetron (ZOFRAN) IV, oxyCODONE, promethazine, sodium chloride flush, traMADol, white petrolatum   Objective: Vitals:   09/15/19 2123 09/16/19 0644  BP: 127/63 133/67  Pulse: 97 90  Resp: (!) 24 (!) 25  Temp: 98.6 F (37 C) 98.1 F (36.7 C)  SpO2: 97% 100%    Intake/Output Summary (Last 24 hours) at 09/16/2019 1043 Last data filed at 09/16/2019 0645 Gross per 24 hour  Intake -  Output 1250 ml   Net -1250 ml   Filed Weights   09/14/19 0447 09/15/19 0511 09/16/19 0500  Weight: 75.9 kg 76 kg 74.4 kg   Weight change: -1.6 kg Body mass index is 29.06 kg/m.   Physical Exam: General exam: Propped up in bed.  Not in distress.  Right hip pain improving. Skin: No rashes, lesions or ulcers. HEENT: Atraumatic, normocephalic, supple neck, no obvious bleeding Lungs: Clear to auscultation bilaterally CVS: Regular rate and rhythm, no murmur GI/Abd soft, mild appropriate tenderness postsurgical, bowel sound present CNS: Alert, awake, alert x3 Psychiatry: Mood appropriate Extremities: No pedal edema, no calf tenderness.  Improving tenderness on the lateral aspect of the right hip.  Data Review: I have personally reviewed the laboratory data and studies available.  Recent Labs  Lab 09/12/19 0409  09/13/19 0339 09/13/19 0542 09/13/19 1734 09/14/19 0313 09/15/19 0333 09/16/19 0333  WBC 11.1*  --  9.3  --   --  10.0 9.7 8.5  NEUTROABS 8.0*  --  6.6  --   --  7.1 7.0 5.8  HGB 7.2*   < > 7.2* 7.3* 9.9* 9.2* 8.7*  8.9*  HCT 22.7*   < > 22.6* 22.7* 29.8* 27.6* 26.6* 27.8*  MCV 93.8  --  92.2  --   --  91.1 91.7 92.1  PLT 246  --  214  --   --  210 228 225   < > = values in this interval not displayed.   Recent Labs  Lab 09/11/19 0358 09/12/19 0409 09/14/19 0313 09/15/19 0333 09/16/19 0333  NA 140 135 138 137 137  K 3.9 3.9 4.3 3.7 4.2  CL 106 103 103 102 101  CO2 26 24 26 23 24   GLUCOSE 98 119* 109* 114* 109*  BUN 15 13 10 12 16   CREATININE 1.25* 1.35* 1.49* 1.23* 1.38*  CALCIUM 8.5* 8.1* 8.4* 8.2* 8.4*  PHOS  --   --  4.3  --   --     Terrilee Croak, MD  [facility] 09/16/2019

## 2019-09-17 LAB — CBC WITH DIFFERENTIAL/PLATELET
Abs Immature Granulocytes: 0.05 10*3/uL (ref 0.00–0.07)
Basophils Absolute: 0.1 10*3/uL (ref 0.0–0.1)
Basophils Relative: 1 %
Eosinophils Absolute: 0.2 10*3/uL (ref 0.0–0.5)
Eosinophils Relative: 3 %
HCT: 29.1 % — ABNORMAL LOW (ref 36.0–46.0)
Hemoglobin: 9.4 g/dL — ABNORMAL LOW (ref 12.0–15.0)
Immature Granulocytes: 1 %
Lymphocytes Relative: 18 %
Lymphs Abs: 1.5 10*3/uL (ref 0.7–4.0)
MCH: 29.7 pg (ref 26.0–34.0)
MCHC: 32.3 g/dL (ref 30.0–36.0)
MCV: 91.8 fL (ref 80.0–100.0)
Monocytes Absolute: 1 10*3/uL (ref 0.1–1.0)
Monocytes Relative: 12 %
Neutro Abs: 5.6 10*3/uL (ref 1.7–7.7)
Neutrophils Relative %: 65 %
Platelets: 220 10*3/uL (ref 150–400)
RBC: 3.17 MIL/uL — ABNORMAL LOW (ref 3.87–5.11)
RDW: 13.2 % (ref 11.5–15.5)
WBC: 8.5 10*3/uL (ref 4.0–10.5)
nRBC: 0 % (ref 0.0–0.2)

## 2019-09-17 LAB — BASIC METABOLIC PANEL
Anion gap: 14 (ref 5–15)
BUN: 20 mg/dL (ref 8–23)
CO2: 22 mmol/L (ref 22–32)
Calcium: 8.4 mg/dL — ABNORMAL LOW (ref 8.9–10.3)
Chloride: 100 mmol/L (ref 98–111)
Creatinine, Ser: 1.2 mg/dL — ABNORMAL HIGH (ref 0.44–1.00)
GFR calc Af Amer: 48 mL/min — ABNORMAL LOW (ref >60)
GFR calc non Af Amer: 42 mL/min — ABNORMAL LOW (ref >60)
Glucose, Bld: 106 mg/dL — ABNORMAL HIGH (ref 70–99)
Potassium: 4.3 mmol/L (ref 3.5–5.1)
Sodium: 136 mmol/L (ref 135–145)

## 2019-09-17 LAB — MAGNESIUM: Magnesium: 2 mg/dL (ref 1.7–2.4)

## 2019-09-17 LAB — GLUCOSE, CAPILLARY
Glucose-Capillary: 106 mg/dL — ABNORMAL HIGH (ref 70–99)
Glucose-Capillary: 121 mg/dL — ABNORMAL HIGH (ref 70–99)
Glucose-Capillary: 121 mg/dL — ABNORMAL HIGH (ref 70–99)
Glucose-Capillary: 96 mg/dL (ref 70–99)

## 2019-09-17 LAB — PHOSPHORUS: Phosphorus: 4.2 mg/dL (ref 2.5–4.6)

## 2019-09-17 MED ORDER — ALUM & MAG HYDROXIDE-SIMETH 200-200-20 MG/5ML PO SUSP
30.0000 mL | Freq: Four times a day (QID) | ORAL | Status: DC | PRN
  Administered 2019-09-17 – 2019-09-23 (×7): 30 mL via ORAL
  Filled 2019-09-17 (×8): qty 30

## 2019-09-17 NOTE — Progress Notes (Signed)
PROGRESS NOTE  Alyssa Odonnell  DOB: Mar 08, 1934  PCP: Drake Leach, MD [id number]  DOA: 08/28/2019  LOS: 19 days   Chief Complaint  Patient presents with  . 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  Patient presented to the ED on 11/9 with complaint of sudden onset severe nausea, dry heaving without vomiting, chest epigastric abdominal pain.   CT abdomen showed large hiatal hernia with gastric volvulus with gastric outlet obstruction. Multiple unsuccessful attempts to pass NG tube at bedside as well as under fluoroscopy.  General surgery consulted. 11/13, patient underwent laparoscopic gastropexy with placement of a 20 French gastrostomy tube with extensive lysis of adhesion. Prolonged but good postoperative outcome and is now on oral appetite and hydration. During this hospitalization, patient also underwent left lower extremity arteriogram and stenting.  Subjective: Continues to have pain in her right hip.  Right hip feels very heavy.  Has some muscle spasms in her right hip.  Reports eating a large amount of breakfast today feels as though she will regurgitate.  She was advised to eat small meals.  Assessment/Plan: Large hiatal hernia with gastric outlet obstruction  -Presented with sudden onset nausea, chest/epigastric abdominal pain  -CT scan showed large hiatal hernia with gastric outlet obstruction -11/13, patient underwent laparoscopic gastropexy with placement of a 61 French gastrostomy tube with extensive lysis of adhesion. -Per general surgery patient will require GT for 6 weeks. -Patient complains of feeling full with appetite. Encouraged to take frequent small meals.  Acute ischemia of left fourth and fifth toe Peripheral artery disease -During this hospitalization, patient complained of pain and cyanosis of the  left fourth and fifth toe.  There was a concern of ischemia.  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 -ABI; resting right and left ABI indicate moderate right lower extremity arterial disease. -Vascular surgery was consulted.  -s/p arteriogram 11-23. stent of left SFA with distal 5 x 25 cm Viabahn on proximal 5 x 10 cm Viabahn, plain balloon angioplasty of left tibioperoneal trunk with 3 mm balloon. -Vascular recommended plavix.  Patient is already on Eliquis as well. -Patient developed right groin hematoma at the site of catheter access.  Resolved eventually.  Acute blood loss anemia; groin hematoma post arteriogram.  Anemia of chronic disease -Baseline hemoglobin 11-12  -2 units of PRBCs transfused during this hospitalization, last transfusion on 11/25. -Hemoglobin stable now, 9.4 today. -No evidence of active bleeding. Groin hematoma site looks intact.  -On Eliquis and Protonix.    Right hip pain - suspect bursitis. -Started complaining from 11/26.  Improving now with lidocaine patch, and scheduled Flexeril.  Requiring less doses of IV morphine.    Hypokalemia: Resolved.   Paroxysmal A. Fib On amiodarone and Eliquis.  Hypertension: -Blood pressure stable. Benicar and hydrochlorothiazide remain on hold.   Chronic kidney disease a stage IIIb:  Baseline creatinine less than 1.3-1.5.  Because of poor appetite, patient prefers to remain on IV hydration with taper..  Reduce to normal saline at 50 ML per hour.  Monitor creatinine.  1.2 today.  Hypothyroidism: Continue with Synthroid. elevated TSH, Free T 3  Low, and T4 high. She will need repeat thyroid function test in 4 weeks.   Mobility: Encourage ambulation.  PT following. Diet: Regular diet Fluid: Normal saline at 50 mils per hour  DVT prophylaxis:  Eliquis Code Status:   Code Status: Full Code  Family Communication:  Not at bedside Expected Discharge:  Needs SNF at  discharge.  Medically stable for discharge at this time.  Case manager made aware.  Insurance authorization awaited.  Consultants:  General surgery  Vascular surgery  Cardiology  Procedures:  11/13: Laparoscopic gastropexy with placement of 72 French gastrostomy tube with extensive lysis of adhesions 11/23:1.  Ultrasound-guided cannulation right common femoral artery 2.  Aortogram with bilateral lower extremity runoff 3.  Ultrasound-guided cannulation left posterior tibial artery 4.  Stent of left SFA with distal 5 x 25 cm Viabahn and proximal 5 x 10 cm Viabahn 5.  Plain balloon angioplasty of left tibioperoneal trunk with 3 mm balloon  6.  Minx device closure right common femoral artery  Antimicrobials: Anti-infectives (From admission, onward)   Start     Dose/Rate Route Frequency Ordered Stop   09/01/19 1400  ciprofloxacin (CIPRO) IVPB 400 mg     400 mg 200 mL/hr over 60 Minutes Intravenous On call to O.R. 09/01/19 0738 09/01/19 1615      Diet Order            Diet regular Room service appropriate? Yes; Fluid consistency: Thin  Diet effective now              Infusions:  . sodium chloride    . sodium chloride 50 mL/hr at 09/17/19 0600    Scheduled Meds: . amiodarone  200 mg Oral Daily  . apixaban  5 mg Oral BID  . chlorhexidine  15 mL Mouth Rinse BID  . clopidogrel  75 mg Oral Q breakfast  . cyclobenzaprine  5 mg Oral TID  . docusate sodium  100 mg Oral Daily  . feeding supplement (ENSURE ENLIVE)  237 mL Oral BID BM  . latanoprost  1 drop Both Eyes QHS  . levothyroxine  100 mcg Oral Q0600  . lidocaine  1 patch Transdermal Q24H  . mouth rinse  15 mL Mouth Rinse q12n4p  . pantoprazole  40 mg Oral Daily  . polyethylene glycol  17 g Oral BID  . sodium chloride flush  3 mL Intravenous Q12H    PRN meds: sodium chloride, acetaminophen, alum & mag hydroxide-simeth, bisacodyl, Glycerin (Adult), hydrALAZINE, hydrALAZINE, labetalol, methocarbamol, morphine  injection, ondansetron (ZOFRAN) IV, oxyCODONE, promethazine, sodium chloride flush, traMADol, white petrolatum   Objective: Vitals:   09/17/19 1200 09/17/19 1713  BP:  122/83  Pulse:  (!) 101  Resp: (!) 28 (!) 22  Temp:  98.4 F (36.9 C)  SpO2:  100%    Intake/Output Summary (Last 24 hours) at 09/17/2019 1932 Last data filed at 09/17/2019 1837 Gross per 24 hour  Intake 3803.33 ml  Output 1500 ml  Net 2303.33 ml   Filed Weights   09/15/19 0511 09/16/19 0500 09/17/19 0500  Weight: 76 kg 74.4 kg 75.8 kg   Weight change: 1.4 kg Body mass index is 29.6 kg/m.   Physical Exam: General exam: Alert, awake, oriented x 3 Respiratory system: Clear to auscultation. Respiratory effort normal. Cardiovascular system:RRR. No murmurs, rubs, gallops. Gastrointestinal system: Abdomen is nondistended, soft and diffusely tender over surgical site. No organomegaly or masses felt. Normal bowel sounds heard. Central nervous system: Alert and oriented. No focal neurological deficits. Extremities: Swelling of right thigh/right hip Skin: No rashes, lesions or ulcers Psychiatry: Judgement and insight appear normal. Mood & affect appropriate.    Data Review: I have personally reviewed the laboratory data  and studies available.  Recent Labs  Lab 09/13/19 0339  09/13/19 1734 09/14/19 0313 09/15/19 0333 09/16/19 0333 09/17/19 0354  WBC 9.3  --   --  10.0 9.7 8.5 8.5  NEUTROABS 6.6  --   --  7.1 7.0 5.8 5.6  HGB 7.2*   < > 9.9* 9.2* 8.7* 8.9* 9.4*  HCT 22.6*   < > 29.8* 27.6* 26.6* 27.8* 29.1*  MCV 92.2  --   --  91.1 91.7 92.1 91.8  PLT 214  --   --  210 228 225 220   < > = values in this interval not displayed.   Recent Labs  Lab 09/12/19 0409 09/14/19 0313 09/15/19 0333 09/16/19 0333 09/17/19 0354  NA 135 138 137 137 136  K 3.9 4.3 3.7 4.2 4.3  CL 103 103 102 101 100  CO2 24 26 23 24 22   GLUCOSE 119* 109* 114* 109* 106*  BUN 13 10 12 16 20   CREATININE 1.35* 1.49* 1.23* 1.38*  1.20*  CALCIUM 8.1* 8.4* 8.2* 8.4* 8.4*  MG  --   --   --   --  2.0  PHOS  --  4.3  --   --  4.2    Kathie Dike, MD  [facility] 09/17/2019

## 2019-09-18 ENCOUNTER — Inpatient Hospital Stay (HOSPITAL_COMMUNITY): Payer: Medicare Other

## 2019-09-18 DIAGNOSIS — I97630 Postprocedural hematoma of a circulatory system organ or structure following a cardiac catheterization: Secondary | ICD-10-CM

## 2019-09-18 DIAGNOSIS — E876 Hypokalemia: Secondary | ICD-10-CM

## 2019-09-18 LAB — BASIC METABOLIC PANEL
Anion gap: 14 (ref 5–15)
BUN: 28 mg/dL — ABNORMAL HIGH (ref 8–23)
CO2: 22 mmol/L (ref 22–32)
Calcium: 8.2 mg/dL — ABNORMAL LOW (ref 8.9–10.3)
Chloride: 100 mmol/L (ref 98–111)
Creatinine, Ser: 1.21 mg/dL — ABNORMAL HIGH (ref 0.44–1.00)
GFR calc Af Amer: 48 mL/min — ABNORMAL LOW (ref >60)
GFR calc non Af Amer: 41 mL/min — ABNORMAL LOW (ref >60)
Glucose, Bld: 106 mg/dL — ABNORMAL HIGH (ref 70–99)
Potassium: 4.2 mmol/L (ref 3.5–5.1)
Sodium: 136 mmol/L (ref 135–145)

## 2019-09-18 LAB — GLUCOSE, CAPILLARY
Glucose-Capillary: 104 mg/dL — ABNORMAL HIGH (ref 70–99)
Glucose-Capillary: 109 mg/dL — ABNORMAL HIGH (ref 70–99)
Glucose-Capillary: 111 mg/dL — ABNORMAL HIGH (ref 70–99)
Glucose-Capillary: 120 mg/dL — ABNORMAL HIGH (ref 70–99)

## 2019-09-18 LAB — SARS CORONAVIRUS 2 (TAT 6-24 HRS): SARS Coronavirus 2: NEGATIVE

## 2019-09-18 NOTE — TOC Progression Note (Signed)
Transition of Care [facility]) - Progression Note    Patient Details  Name: Alyssa Odonnell MRN: [id number] Date of Birth: May 15, 1934  Transition of Care [facility]) CM/SW Contact  Eileen Stanford, LCSW Phone Number: 09/18/2019, 10:56 AM  Clinical Narrative:   Pt insurance auth for shannon gray expired. We will need to obtain a new auth, however will need updated PT note.    Expected Discharge Plan: [place] Barriers to Discharge: Continued Medical Work up  Expected Discharge Plan and Services Expected Discharge Plan: [place] In-house Referral: NA   Post Acute Care Choice: [place] Living arrangements for the past 2 months: Single Family Home                                       Social Determinants of Health (SDOH) Interventions    Readmission Risk Interventions No flowsheet data found.

## 2019-09-18 NOTE — Progress Notes (Signed)
[facility] PROGRESS NOTE    Progress Note  Alyssa Odonnell  [id number] DOB: [birth year] DOA: 08/28/2019 PCP: Drake Leach, MD     Brief Narrative:   Alyssa Odonnell is an 85 y.o. 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 CT scan of the abdomen showed large hiatal hernia with gastric volvulus and gastric outlet obstruction.  With  multiple unsuccessful attempts to pass NG tube at bedside as well under fluoroscopy.  General surgery was consulted and he underwent last for scopic gastropexy on 09/01/2019 with placement of a 20 French gastrostomy tube and extensive adhesion lysis. During this hospitalization he underwent left lower extremity arteriogram and stenting.  Assessment/Plan:   Large hiatal hernia with gastric outlet obstruction: CT scan on admission show a large hiatal hernia with gastric outlet obstruction and volvulus. Underwent laparoscopic gastric pexy on 09/01/2019 with placement of a 20 French gastrostomy tube and lysis of adhesions. Per general surgery will require EGD in 6 weeks.    Acute ischemic left fourth and fifth toe/peripheral vascular disease: His left fourth and fifth toe became cyanotic and painful he is known to have an SFA occlusion with three-vessel runoff in 2017. ABI showed moderate right lower extremity arterial disease. Vascular surgery was consulted he is status post arteriogram on 09/11/2019 with a stent to the left SFA and balloon angioplasty of the trunk. Vascular recommended to come continue Eliquis we have added as per vascular recommendation Plavix.   Patient developed a small right groin hematoma at the catheter site.  Acute blood loss anemia/groin hematoma post arteriogram/anemia of chronic disease:  With a hemoglobin of 11-12. Status post 2 unit of packed red blood cells on 09/13/2019. Currently on Eliquis.  Right hip pain: Suspect bursitis: Patient currently using lidocaine patch.  Continue to titrate down IV morphine.  Hypokalemia: Now resolved.  Paroxysmal atrial fibrillation: Continue amiodarone and Eliquis.  Essential hypertension: Blood pressure fairly controlled off antihypertensive medication continue to monitor.  Chronic kidney disease stage III: With a baseline creatinine of 1.3-1.5, patient was started on IV fluids.  He is currently taking regular orals we will KVO IV fluids.  Hypothyroidism: Continue Synthroid.  Will need repeat thyroid studies in 4 to 6 weeks.    DVT prophylaxis: Eliquis Family Communication:nnone Disposition Plan/Barrier to D/C: SNF  Code Status:     Code Status Orders  (From admission, onward)         Start     Ordered   08/29/19 0453  Full code  Continuous     08/29/19 0455        Code Status History    Date Active Date Inactive Code Status Order ID Comments User Context   03/09/2017 1835 03/11/2017 2355 Full Code [id number]  Mercy Riding, MD Inpatient   03/09/2017 1835 03/09/2017 [place] Full Code [id number]  Lovenia Kim, MD Inpatient   07/06/2016 1112 07/06/2016 1836 Full Code [id number]  Angelia Mould, MD Inpatient   Advance Care Planning Activity    Advance Directive Documentation     Most Recent Value  Type of Advance Directive  Living will  Pre-existing out of facility DNR order (yellow form or pink MOST form)  -  "MOST" Form in Place?  -        IV Access:  Peripheral IV   Procedures and diagnostic studies:   No results found.   Medical Consultants:    None.  Anti-Infectives:   None  Subjective:    Alyssa Odonnell feels great no complaints.  Objective:    Vitals:   09/17/19 1958 09/17/19 2000 09/18/19 0015 09/18/19 0446  BP: 122/67  102/60 120/66  Pulse: (!) 108  95 88  Resp: 20  (!) 24 18 20   Temp: 98.3 F (36.8 C)  97.8 F (36.6 C) 98 F (36.7 C)  TempSrc: Oral  Oral Oral  SpO2: 98%  100% 100%  Weight:    75.2 kg  Height:       SpO2: 100 % O2 Flow Rate (L/min): 2 L/min   Intake/Output Summary (Last 24 hours) at 09/18/2019 1005 Last data filed at 09/18/2019 0700 Gross per 24 hour  Intake 1553.55 ml  Output 1500 ml  Net 53.55 ml   Filed Weights   09/16/19 0500 09/17/19 0500 09/18/19 0446  Weight: 74.4 kg 75.8 kg 75.2 kg    Exam: General exam: In no acute distress. Respiratory system: Good air movement and clear to auscultation. Cardiovascular system: S1 & S2 heard, RRR. No JVD.Marland Kitchen  Gastrointestinal system: Abdomen is nondistended, soft and nontender.  Central nervous system: Alert and oriented. No focal neurological deficits. Extremities: No pedal edema. Skin: No rashes, lesions or ulcers Psychiatry: Judgement and insight appear normal. Mood & affect appropriate.   Data Reviewed:    Labs: Basic Metabolic Panel: Recent Labs  Lab 09/14/19 0313 09/15/19 0333 09/16/19 0333 09/17/19 0354 09/18/19 0352  NA 138 137 137 136 136  K 4.3 3.7 4.2 4.3 4.2  CL 103 102 101 100 100  CO2 26 23 24 22 22   GLUCOSE 109* 114* 109* 106* 106*  BUN 10 12 16 20  28*  CREATININE 1.49* 1.23* 1.38* 1.20* 1.21*  CALCIUM 8.4* 8.2* 8.4* 8.4* 8.2*  MG  --   --   --  2.0  --   PHOS 4.3  --   --  4.2  --    GFR Estimated Creatinine Clearance: 34.2 mL/min (A) (by C-G formula based on SCr of 1.21 mg/dL (H)). Liver Function Tests: No results for input(s): AST, ALT, ALKPHOS, BILITOT, PROT, ALBUMIN in the last 168 hours. No results for input(s): LIPASE, AMYLASE in the last 168 hours. No results for input(s): AMMONIA in the last 168 hours. Coagulation profile No results for input(s): INR, PROTIME in the last 168 hours. COVID-19 Labs  No results for input(s): DDIMER, FERRITIN, LDH, CRP in the last 72 hours.  Lab Results  Component Value Date   SARSCOV2NAA NEGATIVE  09/06/2019   [place] NEGATIVE 08/29/2019    CBC: Recent Labs  Lab 09/13/19 0339  09/13/19 1734 09/14/19 0313 09/15/19 0333 09/16/19 0333 09/17/19 0354  WBC 9.3  --   --  10.0 9.7 8.5 8.5  NEUTROABS 6.6  --   --  7.1 7.0 5.8 5.6  HGB 7.2*   < > 9.9* 9.2* 8.7* 8.9* 9.4*  HCT 22.6*   < > 29.8* 27.6* 26.6* 27.8* 29.1*  MCV 92.2  --   --  91.1 91.7 92.1 91.8  PLT 214  --   --  210 228 225 220   < > = values in this interval not displayed.   Cardiac Enzymes: No results for input(s): CKTOTAL, CKMB, CKMBINDEX, TROPONINI in the last 168 hours. BNP (last 3 results) No results for input(s): PROBNP in the last 8760 hours. CBG:  Recent Labs  Lab 09/17/19 0725 09/17/19 1157 09/17/19 1610 09/18/19 0015 09/18/19 0755  GLUCAP 96 121* 121* 111* 109*   D-Dimer: No results for input(s): DDIMER in the last 72 hours. Hgb A1c: No results for input(s): HGBA1C in the last 72 hours. Lipid Profile: No results for input(s): CHOL, HDL, LDLCALC, TRIG, CHOLHDL, LDLDIRECT in the last 72 hours. Thyroid function studies: No results for input(s): TSH, T4TOTAL, T3FREE, THYROIDAB in the last 72 hours.  Invalid input(s): FREET3 Anemia work up: No results for input(s): VITAMINB12, FOLATE, FERRITIN, TIBC, IRON, RETICCTPCT in the last 72 hours. Sepsis Labs: Recent Labs  Lab 09/14/19 0313 09/15/19 0333 09/16/19 0333 09/17/19 0354  WBC 10.0 9.7 8.5 8.5   Microbiology No results found for this or any previous visit (from the past 240 hour(s)).   Medications:   . amiodarone  200 mg Oral Daily  . apixaban  5 mg Oral BID  . chlorhexidine  15 mL Mouth Rinse BID  . clopidogrel  75 mg Oral Q breakfast  . cyclobenzaprine  5 mg Oral TID  . docusate sodium  100 mg Oral Daily  . feeding supplement (ENSURE ENLIVE)  237 mL Oral BID BM  . latanoprost  1 drop Both Eyes QHS  . levothyroxine  100 mcg Oral Q0600  . lidocaine  1 patch Transdermal Q24H  . mouth rinse  15 mL Mouth Rinse q12n4p  .  pantoprazole  40 mg Oral Daily  . polyethylene glycol  17 g Oral BID  . sodium chloride flush  3 mL Intravenous Q12H   Continuous Infusions: . sodium chloride    . sodium chloride 50 mL/hr at 09/18/19 0700     LOS: 20 days   Charlynne Cousins  [facility]  09/18/2019, 10:05 AM

## 2019-09-18 NOTE — Progress Notes (Signed)
PT Cancellation Note  Patient Details Name: Alyssa Odonnell MRN: [id number] DOB: 1934/09/17   Cancelled Treatment:    Reason Eval/Treat Not Completed: Other (comment). Pt eating dinner and states she needs at least an hour before ready for PT. Will follow up for PT treatment as schedule permits.  Mabeline Caras, PT, DPT Acute Rehabilitation Services  Pager [phone] Office [place] 09/18/2019, 3:47 PM

## 2019-09-18 NOTE — Progress Notes (Signed)
VASCULAR LAB PRELIMINARY  PRELIMINARY  PRELIMINARY  PRELIMINARY  Right lower extremity venous duplex completed.    Preliminary report:  See CV proc for preliminary results.  Dawnetta Copenhaver, RVT 09/18/2019, 2:55 PM

## 2019-09-18 NOTE — Progress Notes (Signed)
   Patient's left foot appears warm and well-perfused and pain is much improved.  Right groin still tender although soft.  I have ordered right groin duplex we will follow-up when completed.   Manfred Laspina C. Donzetta Matters, MD [facility] [employer] Office: [phone] Pager: [phone]

## 2019-09-19 LAB — BASIC METABOLIC PANEL
Anion gap: 10 (ref 5–15)
BUN: 28 mg/dL — ABNORMAL HIGH (ref 8–23)
CO2: 25 mmol/L (ref 22–32)
Calcium: 8.4 mg/dL — ABNORMAL LOW (ref 8.9–10.3)
Chloride: 101 mmol/L (ref 98–111)
Creatinine, Ser: 1.22 mg/dL — ABNORMAL HIGH (ref 0.44–1.00)
GFR calc Af Amer: 47 mL/min — ABNORMAL LOW (ref >60)
GFR calc non Af Amer: 41 mL/min — ABNORMAL LOW (ref >60)
Glucose, Bld: 123 mg/dL — ABNORMAL HIGH (ref 70–99)
Potassium: 4.6 mmol/L (ref 3.5–5.1)
Sodium: 136 mmol/L (ref 135–145)

## 2019-09-19 LAB — GLUCOSE, CAPILLARY
Glucose-Capillary: 111 mg/dL — ABNORMAL HIGH (ref 70–99)
Glucose-Capillary: 119 mg/dL — ABNORMAL HIGH (ref 70–99)
Glucose-Capillary: 89 mg/dL (ref 70–99)

## 2019-09-19 NOTE — Progress Notes (Signed)
Physical Therapy Treatment Patient Details Name: Alyssa Odonnell MRN: [id number] DOB: Dec 28, 1933 Today's Date: 09/19/2019    History of Present Illness Pt is an 85 y.o. female admitted 08/28/19 with worsening epigastric pain. Worked up for large hiatal hernia with gastric outlet obstruction. S/p laparoscopic gastropexy with placement of gastrostomy tube with extensive lysis11/13. PMH includes afib, stroke, HTN, glaucoma. During admission, pt developed acute ischemia of left fourth and fifth toe and is now s/p arteriogram with stent placement 11-23, and has since had 2 units of blood trasnfused (11/24 and 11/25).    PT Comments    Pt requiring increased assist this session due to bilat knee pain. Unable to tolerate gait. Declining sitting up in recliner. She required min/mod assist bed mobility, mod assist sit to stand with RW and +1-2 mod/max assist SPT bed <> BSC. Pt very anxious and fearful of falling. HR into 130s during mobility. Pt returned to bed at end of session.    Follow Up Recommendations  SNF;Supervision for mobility/OOB     Equipment Recommendations  None recommended by PT    Recommendations for Other Services       Precautions / Restrictions Precautions Precautions: Fall Restrictions Weight Bearing Restrictions: No    Mobility  Bed Mobility Overal bed mobility: Needs Assistance Bed Mobility: Supine to Sit;Sit to Supine     Supine to sit: HOB elevated;Min assist Sit to supine: Mod assist   General bed mobility comments: +rail, cues for sequencing, increased time  Transfers Overall transfer level: Needs assistance   Transfers: Sit to/from Stand;Stand Pivot Transfers Sit to Stand: Mod assist Stand pivot transfers: +2 physical assistance;Mod assist;Max assist       General transfer comment: sit to stand with RW. Pt unable to take pivot steps for transfer bed<>BSC. Pt with c/o severe bilat knee pain in stance. Very fearful of falling. RW removed and SPT  performed +2 assist.  Ambulation/Gait                 Stairs             Wheelchair Mobility    Modified Rankin (Stroke Patients Only)       Balance Overall balance assessment: Needs assistance Sitting-balance support: Feet supported Sitting balance-Leahy Scale: Fair       Standing balance-Leahy Scale: Poor Standing balance comment: reliant on external support                            Cognition Arousal/Alertness: Awake/alert Behavior During Therapy: Anxious Overall Cognitive Status: Within Functional Limits for tasks assessed                                 General Comments: heightened fear of falling, becoming anxious and tachy with mobility      Exercises      General Comments General comments (skin integrity, edema, etc.): HR in 130s with mobility attempts.      Pertinent Vitals/Pain Pain Assessment: Faces Faces Pain Scale: Hurts whole lot Pain Location: bilat knees and R hip Pain Descriptors / Indicators: Moaning;Grimacing;Guarding Pain Intervention(s): Limited activity within patient's tolerance;Repositioned    Home Living                      Prior Function            PT Goals (current goals can now be found in  the care plan section) Acute Rehab PT Goals Patient Stated Goal: decrease pain Progress towards PT goals: Not progressing toward goals - comment(bilat knee pain)    Frequency    Min 2X/week      PT Plan Current plan remains appropriate    Co-evaluation              AM-PAC PT "6 Clicks" Mobility   Outcome Measure  Help needed turning from your back to your side while in a flat bed without using bedrails?: A Little Help needed moving from lying on your back to sitting on the side of a flat bed without using bedrails?: A Little Help needed moving to and from a bed to a chair (including a wheelchair)?: A Lot Help needed standing up from a chair using your arms (e.g., wheelchair  or bedside chair)?: A Lot Help needed to walk in hospital room?: Total Help needed climbing 3-5 steps with a railing? : Total 6 Click Score: 12    End of Session Equipment Utilized During Treatment: Gait belt;Oxygen(2L O2) Activity Tolerance: Patient limited by pain Patient left: with call bell/phone within reach;in bed;with bed alarm set Nurse Communication: Mobility status PT Visit Diagnosis: Other abnormalities of gait and mobility (R26.89);Pain Pain - part of body: Knee     Time: [id number] PT Time Calculation (min) (ACUTE ONLY): 29 min  Charges:  $Therapeutic Activity: 23-37 mins                     Lorrin Goodell, PT  Office # [phone] Pager [phone]    Lorriane Shire 09/19/2019, 11:55 AM

## 2019-09-19 NOTE — Progress Notes (Signed)
   Duplex ultrasound right groin reviewed no evidence of pseudoaneurysm.  She does have some tenderness with the thigh is soft with evolving hematoma.  Left foot is warm appears well-perfused.  She will follow-up with me at the beginning of the new year.  Will be available as needed while patient is admitted.  Orion Vandervort C. Donzetta Matters, MD [facility] [place] Office: [phone] Pager: [phone]

## 2019-09-19 NOTE — Progress Notes (Signed)
PROGRESS NOTE    Alyssa Odonnell  [id number] DOB: 07/22/1934 DOA: 08/28/2019 PCP: Drake Leach, MD   Brief Narrative:  Alyssa Odonnell is an 85 y.o. 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 CT scan of the abdomen showed large hiatal hernia with gastric volvulus and gastric outlet obstruction.  With  multiple unsuccessful attempts to pass NG tube at bedside as well under fluoroscopy.  General surgery was consulted and he underwent last for scopic gastropexy on 09/01/2019 with placement of a 20 French gastrostomy tube and extensive adhesion lysis. During this hospitalization he underwent left lower extremity arteriogram and stenting.   Assessment & Plan:   Principal Problem:   Volvulus of stomach Active Problems:   PAD (peripheral artery disease) (HCC)   Essential hypertension   PAF (paroxysmal atrial fibrillation) (HCC)   Gastric outlet obstruction   Large hiatal hernia with gastric outlet obstruction: No changes, CT scan on admission show a large hiatal hernia with gastric outlet obstruction and volvulus. Underwent laparoscopic gastric pexy on 09/01/2019 with placement of a 20 French gastrostomy tube and lysis of adhesions. Per general surgery will require EGD in 6 weeks.    Acute ischemic left fourth and fifth toe/peripheral vascular disease: His left fourth and fifth toe became cyanotic and painful he is known to have an SFA occlusion with three-vessel runoff in 2017. ABI showed moderate right lower extremity arterial disease. Vascular surgery was consulted he is status post arteriogram on 09/11/2019 with a stent to the left SFA and balloon angioplasty of the trunk. Vascular recommended to come continue Eliquis we have added as per  vascular recommendation Plavix.   Patient developed a small right groin hematoma at the catheter site.  Acute blood loss anemia/groin hematoma post arteriogram/anemia of chronic disease: With a hemoglobin 9.4 11/29 Status post 2 unit of packed red blood cells on 09/13/2019. Currently on Eliquis.  Right hip pain: Suspect bursitis: Patient currently using lidocaine patch.  Continue to titrate down IV morphine.  Hypokalemia: Now resolved. 4.6  Paroxysmal atrial fibrillation: Continue amiodarone and Eliquis.  Essential hypertension: Blood pressure fairly controlled off antihypertensive medication continue to monitor.  Chronic kidney disease stage III: With a baseline creatinine of 1.3-1.5, patient was started on IV fluids.  He is currently taking regular orals    Hypothyroidism: Continue Synthroid.  Will need repeat thyroid studies in 4 to 6 weeks.  DVT prophylaxis: [id number]  Code Status: full    Code Status Orders  (From admission, onward)         Start     Ordered   08/29/19 0453  Full code  Continuous     08/29/19 0455        Code Status History    Date Active Date Inactive Code Status Order ID Comments User Context   03/09/2017 1835 03/11/2017 2355 Full Code [id number]  Mercy Riding, MD Inpatient   03/09/2017 1835 03/09/2017 [place] Full Code [id number]  Lovenia Kim, MD Inpatient   07/06/2016 1112 07/06/2016 1836 Full Code [id number]  Angelia Mould, MD Inpatient   Advance Care Planning Activity    Advance Directive Documentation     Most Recent Value  Type of Advance Directive  Living will  Pre-existing out of facility DNR order (yellow form or pink  MOST form)  --  "MOST" Form in Place?  --     Family Communication: none today  Disposition Plan:   Patient remained inpatient for continued treatment with physical therapy for gait mobility balance transfer.  Patient not stable and at risk of falls with subsequent injuries or fractures.  Patient not yet  stable for discharge Consults called: None Admission status: Inpatient   Consultants:   Vascular surgery  Procedures:  Ct Abdomen Pelvis Wo Contrast  Result Date: 08/29/2019 CLINICAL DATA:  Abdominal pain, acute EXAM: CT ABDOMEN AND PELVIS WITHOUT CONTRAST TECHNIQUE: Multidetector CT imaging of the abdomen and pelvis was performed following the standard protocol without IV contrast. COMPARISON:  August 03, 2016 FINDINGS: Lower chest: The visualized heart size within normal limits. No pericardial fluid/thickening. Again noted is a large hiatal hernia with almost the entirety of the stomach rotated and flipped upward is, consistent with a gastric volvulus. The portion of the herniated stomach is markedly dilated with air and food stuff which suggest gastric outlet obstruction. Hepatobiliary: Although limited due to the lack of intravenous contrast, normal in appearance without gross focal abnormality. The patient is status post cholecystectomy. No biliary ductal dilation. Pancreas:  Unremarkable.  No surrounding inflammatory changes. Spleen: Normal in size. Although limited due to the lack of intravenous contrast, normal in appearance. Adrenals/Urinary Tract: Both adrenal glands appear normal. No renal or collecting system calculi. There is left renal atrophy seen. The bladder is unremarkable. Stomach/Bowel: The small bowel and colon are normal in appearance. Scattered colonic diverticula are noted without diverticulitis. Vascular/Lymphatic: There are no enlarged abdominal or pelvic lymph nodes. Aortic Atherosclerosis (ICD10-I70.0). Reproductive: Calcified uterine fibroids are present. Other: No evidence of abdominal wall mass or hernia. Musculoskeletal: No acute or significant osseous findings. IMPRESSION: Large hiatal hernia with gastric volvulus as on prior exam, however now there is a markedly dilated debris-filled stomach which is suggestive of gastric outlet obstruction. Electronically Signed   By:  Prudencio Pair M.D.   On: 08/29/2019 03:13   Dg Abd 1 View  Result Date: 08/31/2019 CLINICAL DATA:  NG tube placement. EXAM: ABDOMEN - 1 VIEW COMPARISON:  08/21/2019 FINDINGS: The nasogastric tube is been retracted slightly since the prior exam. It is coiled within the known intrathoracic stomach. Cardiac leads and tubing over the chest. Cardiomediastinal contours are unchanged. IMPRESSION: The nasogastric tube is been retracted slightly since the prior exam. It is coiled within the known intrathoracic stomach. No other interval changes on this limited fluoroscopic assessment. Electronically Signed   By: Zetta Bills M.D.   On: 08/31/2019 17:30   Dg Abd 1 View  Result Date: 08/29/2019 CLINICAL DATA:  NG tube placement. EXAM: ABDOMEN - 1 VIEW; DG NASO G TUBE PLC W/FL-NO RAD COMPARISON:  CT 08/29/2019. FINDINGS: NG tube noted with tip coiled in the large hiatal hernia. Cardiac monitor noted chest. Cardiac leads and tubing noted over the chest. IMPRESSION: NG tube noted with its tip coiled in the patient's known large hiatal hernia. Electronically Signed   By: Marcello Moores  Register   On: 08/29/2019 12:59   Dg Chest Portable 1 View  Result Date: 08/29/2019 CLINICAL DATA:  Chest pain and shortness of breath EXAM: PORTABLE CHEST 1 VIEW COMPARISON:  08/03/2016 FINDINGS: Cardiac shadow is stable. Aortic calcifications are seen. Loop recorder is noted on the left. Large hiatal hernia is again seen but increased when compared with the prior study. No focal infiltrate or sizable effusion is seen. No bony abnormality is noted. IMPRESSION: Changes  consistent with large hiatal hernia which is increased in size from the prior study. No new focal abnormality is noted. Electronically Signed   By: Inez Catalina M.D.   On: 08/29/2019 00:30   Dg [place]oy[place] Dys Tube Plc W/fl-no Rad  Result Date: 09/18/2019 Fluoroscopy was utilized by the requesting physician.  No radiographic interpretation.   Dg Naso G Tube Plc W/fl-no  Rad  Result Date: 08/29/2019 CLINICAL DATA:  NG tube placement. EXAM: ABDOMEN - 1 VIEW; DG NASO G TUBE PLC W/FL-NO RAD COMPARISON:  CT 08/29/2019. FINDINGS: NG tube noted with tip coiled in the large hiatal hernia. Cardiac monitor noted chest. Cardiac leads and tubing noted over the chest. IMPRESSION: NG tube noted with its tip coiled in the patient's known large hiatal hernia. Electronically Signed   By: Marcello Moores  Register   On: 08/29/2019 12:59   Vas [place] Groin Pseudoaneurysm  Result Date: 09/18/2019  ARTERIAL PSEUDOANEURYSM  Exam: Right groin Indications: Patient complains of groin pain. Comparison Study: No prior study on file Performing Technologist: Sharion Dove RVS  Examination Guidelines: A complete evaluation includes B-mode imaging, spectral Doppler, color Doppler, and power Doppler as needed of all accessible portions of each vessel. Bilateral testing is considered an integral part of a complete examination. Limited examinations for reoccurring indications may be performed as noted.  Summary: No pseudoaneurysm noted in the right groin. There is hematoma noted in the groin and in the proximal thigh. Diagnosing physician: Ruta Hinds MD Electronically signed by Ruta Hinds MD on 09/18/2019 at 3:44:35 PM.   --------------------------------------------------------------------------------    Final    Vas [place] Abi With/wo Tbi  Result Date: 09/07/2019 LOWER EXTREMITY DOPPLER STUDY Indications: Discoloration and pain. High Risk Factors: Hypertension, hyperlipidemia, prior CVA.  Comparison Study: 11/04/17 previous Performing Technologist: Abram Sander RVS  Examination Guidelines: A complete evaluation includes at minimum, Doppler waveform signals and systolic blood pressure reading at the level of bilateral brachial, anterior tibial, and posterior tibial arteries, when vessel segments are accessible. Bilateral testing is considered an integral part of a complete examination. Photoelectric  Plethysmograph (PPG) waveforms and toe systolic pressure readings are included as required and additional duplex testing as needed. Limited examinations for reoccurring indications may be performed as noted.  ABI Findings: +--------+------------------+-----+---------+--------+  Right    Rt Pressure (mmHg) Index Waveform  Comment   +--------+------------------+-----+---------+--------+  Brachial 137                      triphasic           +--------+------------------+-----+---------+--------+  PTA      78                 0.57  biphasic            +--------+------------------+-----+---------+--------+  DP       82                 0.60  biphasic            +--------+------------------+-----+---------+--------+ +--------+------------------+-----+--------+--------------------+  Left     Lt Pressure (mmHg) Index Waveform Comment               +--------+------------------+-----+--------+--------------------+  Brachial                          biphasic restricted extremity  +--------+------------------+-----+--------+--------------------+  PTA      70  0.51  biphasic                       +--------+------------------+-----+--------+--------------------+  DP       61                 0.45  biphasic                       +--------+------------------+-----+--------+--------------------+ +-------+-----------+-----------+------------+------------+  ABI/TBI Today's ABI Today's TBI Previous ABI Previous TBI  +-------+-----------+-----------+------------+------------+  Right   0.60                                               +-------+-----------+-----------+------------+------------+  Left    0.51                                               +-------+-----------+-----------+------------+------------+  Summary: Right: Resting right ankle-brachial index indicates moderate right lower extremity arterial disease. Left: Resting left ankle-brachial index indicates moderate left lower extremity arterial disease.  *See  table(s) above for measurements and observations.  Electronically signed by Monica Martinez MD on 09/07/2019 at 7:15:20 PM.   Final      Antimicrobials:   None   Subjective: No acute events overnight  Objective: Vitals:   09/19/19 0032 09/19/19 0545 09/19/19 0816 09/19/19 1345  BP:  101/83 114/71 116/90  Pulse: (!) 119 (!) 108 (!) 102 (!) 103  Resp: (!) 23 (!) 22 (!) 24   Temp:  97.8 F (36.6 C)  (!) 97.3 F (36.3 C)  TempSrc:  Oral  Oral  SpO2: 99% 99% 100% 100%  Weight:  75.5 kg    Height:        Intake/Output Summary (Last 24 hours) at 09/19/2019 1624 Last data filed at 09/19/2019 0900 Gross per 24 hour  Intake 840 ml  Output 1550 ml  Net -710 ml   Filed Weights   09/17/19 0500 09/18/19 0446 09/19/19 0545  Weight: 75.8 kg 75.2 kg 75.5 kg    Examination:  General exam: Appears calm and comfortable  Respiratory system: Clear to auscultation. Respiratory effort normal. Cardiovascular system: S1 & S2 heard, RRR. No JVD, murmurs, rubs, gallops or clicks. No pedal edema. Gastrointestinal system: Abdomen is nondistended, soft and nontender. No organomegaly or masses felt. Normal bowel sounds heard. Central nervous system: Alert  No focal neurological deficits. Extremities: Warm well perfused no contractures. Skin: No rashes, lesions or ulcers no hematomas noted Psychiatry: Judgement and insight appear normal. Mood & affect appropriate.     Data Reviewed: I have personally reviewed following labs and imaging studies  CBC: Recent Labs  Lab 09/13/19 0339  09/13/19 1734 09/14/19 0313 09/15/19 0333 09/16/19 0333 09/17/19 0354  WBC 9.3  --   --  10.0 9.7 8.5 8.5  NEUTROABS 6.6  --   --  7.1 7.0 5.8 5.6  HGB 7.2*   < > 9.9* 9.2* 8.7* 8.9* 9.4*  HCT 22.6*   < > 29.8* 27.6* 26.6* 27.8* 29.1*  MCV 92.2  --   --  91.1 91.7 92.1 91.8  PLT 214  --   --  210 228 225 220   < > = values in this interval not displayed.  Basic Metabolic Panel: Recent Labs  Lab  09/14/19 0313 09/15/19 0333 09/16/19 0333 09/17/19 0354 09/18/19 0352 09/19/19 0426  NA 138 137 137 136 136 136  K 4.3 3.7 4.2 4.3 4.2 4.6  CL 103 102 101 100 100 101  CO2 26 23 24 22 22 25   GLUCOSE 109* 114* 109* 106* 106* 123*  BUN 10 12 16 20  28* 28*  CREATININE 1.49* 1.23* 1.38* 1.20* 1.21* 1.22*  CALCIUM 8.4* 8.2* 8.4* 8.4* 8.2* 8.4*  MG  --   --   --  2.0  --   --   PHOS 4.3  --   --  4.2  --   --    GFR: Estimated Creatinine Clearance: 34 mL/min (A) (by C-G formula based on SCr of 1.22 mg/dL (H)). Liver Function Tests: No results for input(s): AST, ALT, ALKPHOS, BILITOT, PROT, ALBUMIN in the last 168 hours. No results for input(s): LIPASE, AMYLASE in the last 168 hours. No results for input(s): AMMONIA in the last 168 hours. Coagulation Profile: No results for input(s): INR, PROTIME in the last 168 hours. Cardiac Enzymes: No results for input(s): CKTOTAL, CKMB, CKMBINDEX, TROPONINI in the last 168 hours. BNP (last 3 results) No results for input(s): PROBNP in the last 8760 hours. HbA1C: No results for input(s): HGBA1C in the last 72 hours. CBG: Recent Labs  Lab 09/18/19 0755 09/18/19 1135 09/18/19 1630 09/19/19 0020 09/19/19 0752  GLUCAP 109* 120* 104* 119* 111*   Lipid Profile: No results for input(s): CHOL, HDL, LDLCALC, TRIG, CHOLHDL, LDLDIRECT in the last 72 hours. Thyroid Function Tests: No results for input(s): TSH, T4TOTAL, FREET4, T3FREE, THYROIDAB in the last 72 hours. Anemia Panel: No results for input(s): VITAMINB12, FOLATE, FERRITIN, TIBC, IRON, RETICCTPCT in the last 72 hours. Sepsis Labs: No results for input(s): PROCALCITON, LATICACIDVEN in the last 168 hours.  Recent Results (from the past 240 hour(s))  SARS CORONAVIRUS 2 (TAT 6-24 HRS) Nasopharyngeal Nasopharyngeal Swab     Status: None   Collection Time: 09/18/19 10:08 AM   Specimen: Nasopharyngeal Swab  Result Value Ref Range Status   SARS Coronavirus 2 NEGATIVE NEGATIVE Final     Comment: (NOTE) SARS-CoV-2 target nucleic acids are NOT DETECTED. The SARS-CoV-2 RNA is generally detectable in upper and lower respiratory specimens during the acute phase of infection. Negative results do not preclude SARS-CoV-2 infection, do not rule out co-infections with other pathogens, and should not be used as the sole basis for treatment or other patient management decisions. Negative results must be combined with clinical observations, patient history, and epidemiological information. The expected result is Negative. Fact Sheet for Patients: SugarRoll.be Fact Sheet for Healthcare Providers: https://www.woods-mathews.com/ This test is not yet approved or cleared by the [place] FDA and  has been authorized for detection and/or diagnosis of SARS-CoV-2 by FDA under an Emergency Use Authorization (EUA). This EUA will remain  in effect (meaning this test can be used) for the duration of the COVID-19 declaration under Section 56 4(b)(1) of the Act, 21 U.S.C. section 360bbb-3(b)(1), unless the authorization is terminated or revoked sooner. Performed at [facility], [place] [address] [place], [place] [address]          Radiology Studies: Vas [place] Groin Pseudoaneurysm  Result Date: 09/18/2019  ARTERIAL PSEUDOANEURYSM  Exam: Right groin Indications: Patient complains of groin pain. Comparison Study: No prior study on file Performing Technologist: Sharion Dove RVS  Examination Guidelines: A complete evaluation includes B-mode imaging, spectral Doppler, color Doppler, and  power Doppler as needed of all accessible portions of each vessel. Bilateral testing is considered an integral part of a complete examination. Limited examinations for reoccurring indications may be performed as noted.  Summary: No pseudoaneurysm noted in the right groin. There is hematoma noted in the groin and in the proximal thigh. Diagnosing physician: Ruta Hinds MD Electronically signed by Ruta Hinds MD on 09/18/2019 at 3:44:35 PM.   --------------------------------------------------------------------------------    Final         Scheduled Meds:  amiodarone  200 mg Oral Daily   apixaban  5 mg Oral BID   chlorhexidine  15 mL Mouth Rinse BID   clopidogrel  75 mg Oral Q breakfast   cyclobenzaprine  5 mg Oral TID   docusate sodium  100 mg Oral Daily   feeding supplement (ENSURE ENLIVE)  237 mL Oral BID BM   latanoprost  1 drop Both Eyes QHS   levothyroxine  100 mcg Oral Q0600   lidocaine  1 patch Transdermal Q24H   mouth rinse  15 mL Mouth Rinse q12n4p   pantoprazole  40 mg Oral Daily   polyethylene glycol  17 g Oral BID   sodium chloride flush  3 mL Intravenous Q12H   Continuous Infusions:  sodium chloride     sodium chloride 10 mL/hr at 09/18/19 1730     LOS: 21 days    Time spent: 32 min    Nicolette Bang, MD [facility]  If 7PM-7AM, please contact night-coverage  09/19/2019, 4:24 PM

## 2019-09-20 LAB — BASIC METABOLIC PANEL
Anion gap: 11 (ref 5–15)
BUN: 22 mg/dL (ref 8–23)
CO2: 27 mmol/L (ref 22–32)
Calcium: 8.8 mg/dL — ABNORMAL LOW (ref 8.9–10.3)
Chloride: 98 mmol/L (ref 98–111)
Creatinine, Ser: 1.01 mg/dL — ABNORMAL HIGH (ref 0.44–1.00)
GFR calc Af Amer: 60 mL/min — ABNORMAL LOW (ref >60)
GFR calc non Af Amer: 51 mL/min — ABNORMAL LOW (ref >60)
Glucose, Bld: 122 mg/dL — ABNORMAL HIGH (ref 70–99)
Potassium: 4.5 mmol/L (ref 3.5–5.1)
Sodium: 136 mmol/L (ref 135–145)

## 2019-09-20 LAB — GLUCOSE, CAPILLARY
Glucose-Capillary: 110 mg/dL — ABNORMAL HIGH (ref 70–99)
Glucose-Capillary: 111 mg/dL — ABNORMAL HIGH (ref 70–99)
Glucose-Capillary: 111 mg/dL — ABNORMAL HIGH (ref 70–99)

## 2019-09-20 NOTE — Progress Notes (Signed)
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 CT scan of the abdomen showed large hiatal hernia with gastric volvulus and gastric outlet obstruction.Withmultiple unsuccessful attempts to pass NG tube at bedside as well under fluoroscopy. General surgery was consulted and he underwent last for scopic gastropexy on 09/01/2019 with placement of a 20 French gastrostomy tube and extensive adhesion lysis. During this hospitalization he underwent left lower extremity arteriogram and stenting.   Assessment & Plan:   Principal Problem:   Volvulus of stomach Active Problems:   PAD (peripheral artery disease) (HCC)   Essential hypertension   PAF (paroxysmal atrial fibrillation) (HCC)   Gastric outlet obstruction   Large hiatal hernia with gastric outlet obstruction: No changes, CT scan on admission show a large hiatal hernia with gastric outlet obstruction and volvulus. Underwent laparoscopic gastric pexy on 09/01/2019 with placement of a 20 French gastrostomy tube and lysis of adhesions. Very slow to recover Reported nausea and vomiting Per general surgery will require EGD in 6 weeks.   Acute ischemic left fourth and fifth toe/peripheral vascular disease: His left fourth and fifth toe became cyanotic and painful he is known to have an SFA occlusion with three-vessel runoff in 2017. ABI showed moderate right lower extremity arterial disease. Vascular surgery was consulted he is status post arteriogram on 09/11/2019 with a stent to the left SFA and balloon angioplasty of the trunk. Vascular recommended  to come continue Eliquis we have added as per vascular recommendation Plavix.  Patient developed a small right groin hematoma at the catheter site.  Acute blood loss anemia/groin hematoma post arteriogram/anemia of chronic disease: With a hemoglobin 9.4 11/29 Status post 2 unit of packed red blood cells on 09/13/2019. Currently on Eliquis. Will recheck CBC in the morning  Right hip pain: Suspect bursitis: Patient currently using lidocaine patch. Continue to titrate down IV morphine.  Hypokalemia: Now resolved. 4.5  Paroxysmal atrial fibrillation: Continue amiodarone and Eliquis. Still tachycardicmy evaluation this morning Cardiology was following  Essential hypertension: Blood pressure fairly controlled off antihypertensive medication continue to monitor.  Chronic kidney disease stage III: With a baseline creatinine of 1.3-1.5, patient was started on IV fluids. She is currently taking regular orals   Hypothyroidism: Continue Synthroid. Will need repeat thyroid studies in 4 to 6 weeks.   DVT prophylaxis: [id number]  Code Status: full    Code Status Orders  (From admission, onward)         Start     Ordered   08/29/19 0453  Full code  Continuous     08/29/19 0455        Code Status History    Date Active Date Inactive Code Status Order ID Comments User Context   03/09/2017 1835 03/11/2017 2355 Full Code [id number]  Mercy Riding, MD Inpatient   03/09/2017 1835 03/09/2017 [place] Full Code [id number]  Lovenia Kim, MD Inpatient   07/06/2016 1112 07/06/2016 1836 Full Code [id number]  Angelia Mould, MD Inpatient   Advance Care Planning Activity    Advance Directive Documentation     Most Recent Value  Type of Advance Directive  Living will  Pre-existing  out of facility DNR order (yellow form or pink MOST form)  --  "MOST" Form in Place?  --     Family Communication: None today Disposition Plan:   Patient remained inpatient for continued IV  antiemetics, and postoperative recovery.  Patient not stable, not tolerating diet well, unsafe for discharge Consults called: None Admission status: Inpatient   Consultants:   Vascular surgery  Procedures:  Ct Abdomen Pelvis Wo Contrast  Result Date: 08/29/2019 CLINICAL DATA:  Abdominal pain, acute EXAM: CT ABDOMEN AND PELVIS WITHOUT CONTRAST TECHNIQUE: Multidetector CT imaging of the abdomen and pelvis was performed following the standard protocol without IV contrast. COMPARISON:  August 03, 2016 FINDINGS: Lower chest: The visualized heart size within normal limits. No pericardial fluid/thickening. Again noted is a large hiatal hernia with almost the entirety of the stomach rotated and flipped upward is, consistent with a gastric volvulus. The portion of the herniated stomach is markedly dilated with air and food stuff which suggest gastric outlet obstruction. Hepatobiliary: Although limited due to the lack of intravenous contrast, normal in appearance without gross focal abnormality. The patient is status post cholecystectomy. No biliary ductal dilation. Pancreas:  Unremarkable.  No surrounding inflammatory changes. Spleen: Normal in size. Although limited due to the lack of intravenous contrast, normal in appearance. Adrenals/Urinary Tract: Both adrenal glands appear normal. No renal or collecting system calculi. There is left renal atrophy seen. The bladder is unremarkable. Stomach/Bowel: The small bowel and colon are normal in appearance. Scattered colonic diverticula are noted without diverticulitis. Vascular/Lymphatic: There are no enlarged abdominal or pelvic lymph nodes. Aortic Atherosclerosis (ICD10-I70.0). Reproductive: Calcified uterine fibroids are present. Other: No evidence of abdominal wall mass or hernia. Musculoskeletal: No acute or significant osseous findings. IMPRESSION: Large hiatal hernia with gastric volvulus as on prior exam, however now there is a markedly dilated  debris-filled stomach which is suggestive of gastric outlet obstruction. Electronically Signed   By: Prudencio Pair M.D.   On: 08/29/2019 03:13   Dg Abd 1 View  Result Date: 08/31/2019 CLINICAL DATA:  NG tube placement. EXAM: ABDOMEN - 1 VIEW COMPARISON:  08/21/2019 FINDINGS: The nasogastric tube is been retracted slightly since the prior exam. It is coiled within the known intrathoracic stomach. Cardiac leads and tubing over the chest. Cardiomediastinal contours are unchanged. IMPRESSION: The nasogastric tube is been retracted slightly since the prior exam. It is coiled within the known intrathoracic stomach. No other interval changes on this limited fluoroscopic assessment. Electronically Signed   By: Zetta Bills M.D.   On: 08/31/2019 17:30   Dg Abd 1 View  Result Date: 08/29/2019 CLINICAL DATA:  NG tube placement. EXAM: ABDOMEN - 1 VIEW; DG NASO G TUBE PLC W/FL-NO RAD COMPARISON:  CT 08/29/2019. FINDINGS: NG tube noted with tip coiled in the large hiatal hernia. Cardiac monitor noted chest. Cardiac leads and tubing noted over the chest. IMPRESSION: NG tube noted with its tip coiled in the patient's known large hiatal hernia. Electronically Signed   By: Marcello Moores  Register   On: 08/29/2019 12:59   Dg Chest Portable 1 View  Result Date: 08/29/2019 CLINICAL DATA:  Chest pain and shortness of breath EXAM: PORTABLE CHEST 1 VIEW COMPARISON:  08/03/2016 FINDINGS: Cardiac shadow is stable. Aortic calcifications are seen. Loop recorder is noted on the left. Large hiatal hernia is again seen but increased when compared with the prior study. No focal infiltrate or sizable effusion is seen. No bony abnormality is noted. IMPRESSION: Changes consistent with large hiatal hernia which  is increased in size from the prior study. No new focal abnormality is noted. Electronically Signed   By: Inez Catalina M.D.   On: 08/29/2019 00:30   Dg [place] Dys Tube Plc W/fl-no Rad  Result Date: 09/18/2019 Fluoroscopy was utilized  by the requesting physician.  No radiographic interpretation.   Dg Naso G Tube Plc W/fl-no Rad  Result Date: 08/29/2019 CLINICAL DATA:  NG tube placement. EXAM: ABDOMEN - 1 VIEW; DG NASO G TUBE PLC W/FL-NO RAD COMPARISON:  CT 08/29/2019. FINDINGS: NG tube noted with tip coiled in the large hiatal hernia. Cardiac monitor noted chest. Cardiac leads and tubing noted over the chest. IMPRESSION: NG tube noted with its tip coiled in the patient's known large hiatal hernia. Electronically Signed   By: Marcello Moores  Register   On: 08/29/2019 12:59   Vas [place] Groin Pseudoaneurysm  Result Date: 09/18/2019  ARTERIAL PSEUDOANEURYSM  Exam: Right groin Indications: Patient complains of groin pain. Comparison Study: No prior study on file Performing Technologist: Sharion Dove RVS  Examination Guidelines: A complete evaluation includes B-mode imaging, spectral Doppler, color Doppler, and power Doppler as needed of all accessible portions of each vessel. Bilateral testing is considered an integral part of a complete examination. Limited examinations for reoccurring indications may be performed as noted.  Summary: No pseudoaneurysm noted in the right groin. There is hematoma noted in the groin and in the proximal thigh. Diagnosing physician: Ruta Hinds MD Electronically signed by Ruta Hinds MD on 09/18/2019 at 3:44:35 PM.   --------------------------------------------------------------------------------    Final    Vas [place] Abi With/wo Tbi  Result Date: 09/07/2019 LOWER EXTREMITY DOPPLER STUDY Indications: Discoloration and pain. High Risk Factors: Hypertension, hyperlipidemia, prior CVA.  Comparison Study: 11/04/17 previous Performing Technologist: Abram Sander RVS  Examination Guidelines: A complete evaluation includes at minimum, Doppler waveform signals and systolic blood pressure reading at the level of bilateral brachial, anterior tibial, and posterior tibial arteries, when vessel segments are accessible.  Bilateral testing is considered an integral part of a complete examination. Photoelectric Plethysmograph (PPG) waveforms and toe systolic pressure readings are included as required and additional duplex testing as needed. Limited examinations for reoccurring indications may be performed as noted.  ABI Findings: +--------+------------------+-----+---------+--------+  Right    Rt Pressure (mmHg) Index Waveform  Comment   +--------+------------------+-----+---------+--------+  Brachial 137                      triphasic           +--------+------------------+-----+---------+--------+  PTA      78                 0.57  biphasic            +--------+------------------+-----+---------+--------+  DP       82                 0.60  biphasic            +--------+------------------+-----+---------+--------+ +--------+------------------+-----+--------+--------------------+  Left     Lt Pressure (mmHg) Index Waveform Comment               +--------+------------------+-----+--------+--------------------+  Brachial                          biphasic restricted extremity  +--------+------------------+-----+--------+--------------------+  PTA      70                 0.51  biphasic                       +--------+------------------+-----+--------+--------------------+  DP       61                 0.45  biphasic                       +--------+------------------+-----+--------+--------------------+ +-------+-----------+-----------+------------+------------+  ABI/TBI Today's ABI Today's TBI Previous ABI Previous TBI  +-------+-----------+-----------+------------+------------+  Right   0.60                                               +-------+-----------+-----------+------------+------------+  Left    0.51                                               +-------+-----------+-----------+------------+------------+  Summary: Right: Resting right ankle-brachial index indicates moderate right lower extremity arterial disease. Left: Resting  left ankle-brachial index indicates moderate left lower extremity arterial disease.  *See table(s) above for measurements and observations.  Electronically signed by Monica Martinez MD on 09/07/2019 at 7:15:20 PM.   Final      Antimicrobials:   None   Subjective: Reports nausea this morning requiring IV antiemetics  Objective: Vitals:   09/20/19 0019 09/20/19 0529 09/20/19 0534 09/20/19 0939  BP: 125/72 109/70  122/76  Pulse: (!) 108 (!) 113 (!) 107 (!) 119  Resp: (!) 25  (!) 21 (!) 23  Temp:  98.2 F (36.8 C)    TempSrc:  Oral    SpO2: 100% 99% 100% 95%  Weight:  75.4 kg    Height:        Intake/Output Summary (Last 24 hours) at 09/20/2019 1500 Last data filed at 09/20/2019 0500 Gross per 24 hour  Intake 300 ml  Output 400 ml  Net -100 ml   Filed Weights   09/18/19 0446 09/19/19 0545 09/20/19 0529  Weight: 75.2 kg 75.5 kg 75.4 kg    Examination:  General exam: Appears calm and comfortable  Respiratory system: Clear to auscultation. Respiratory effort normal. Cardiovascular system: S1 & S2 heard, RRR. No JVD, murmurs, rubs, gallops or clicks. No pedal edema. Gastrointestinal system: Abdomen is nondistended, soft and nontender. No organomegaly or masses felt. Normal bowel sounds heard. Central nervous system: Alert  No focal neurological deficits. Extremities: Warm well perfused no contractures. Skin: No rashes, lesions or ulcers no hematomas noted Psychiatry: Judgement and insight appear normal. Mood & affect appropriate.     Data Reviewed: I have personally reviewed following labs and imaging studies  CBC: Recent Labs  Lab 09/13/19 1734 09/14/19 0313 09/15/19 0333 09/16/19 0333 09/17/19 0354  WBC  --  10.0 9.7 8.5 8.5  NEUTROABS  --  7.1 7.0 5.8 5.6  HGB 9.9* 9.2* 8.7* 8.9* 9.4*  HCT 29.8* 27.6* 26.6* 27.8* 29.1*  MCV  --  91.1 91.7 92.1 91.8  PLT  --  210 228 225 [id number]   Basic Metabolic Panel: Recent Labs  Lab 09/14/19 0313  09/16/19 0333  09/17/19 0354 09/18/19 0352 09/19/19 0426 09/20/19 1130  NA 138   < > 137 136 136 136 136  K 4.3   < > 4.2 4.3 4.2 4.6 4.5  CL 103   < > 101 100 100 101 98  CO2 26   < > 24 22 22  25 27  GLUCOSE 109*   < > 109* 106* 106* 123* 122*  BUN 10   < > 16 20 28* 28* 22  CREATININE 1.49*   < > 1.38* 1.20* 1.21* 1.22* 1.01*  CALCIUM 8.4*   < > 8.4* 8.4* 8.2* 8.4* 8.8*  MG  --   --   --  2.0  --   --   --   PHOS 4.3  --   --  4.2  --   --   --    < > = values in this interval not displayed.   GFR: Estimated Creatinine Clearance: 41 mL/min (A) (by C-G formula based on SCr of 1.01 mg/dL (H)). Liver Function Tests: No results for input(s): AST, ALT, ALKPHOS, BILITOT, PROT, ALBUMIN in the last 168 hours. No results for input(s): LIPASE, AMYLASE in the last 168 hours. No results for input(s): AMMONIA in the last 168 hours. Coagulation Profile: No results for input(s): INR, PROTIME in the last 168 hours. Cardiac Enzymes: No results for input(s): CKTOTAL, CKMB, CKMBINDEX, TROPONINI in the last 168 hours. BNP (last 3 results) No results for input(s): PROBNP in the last 8760 hours. HbA1C: No results for input(s): HGBA1C in the last 72 hours. CBG: Recent Labs  Lab 09/19/19 0020 09/19/19 0752 09/19/19 1745 09/20/19 0017 09/20/19 1119  GLUCAP 119* 111* 89 111* 110*   Lipid Profile: No results for input(s): CHOL, HDL, LDLCALC, TRIG, CHOLHDL, LDLDIRECT in the last 72 hours. Thyroid Function Tests: No results for input(s): TSH, T4TOTAL, FREET4, T3FREE, THYROIDAB in the last 72 hours. Anemia Panel: No results for input(s): VITAMINB12, FOLATE, FERRITIN, TIBC, IRON, RETICCTPCT in the last 72 hours. Sepsis Labs: No results for input(s): PROCALCITON, LATICACIDVEN in the last 168 hours.  Recent Results (from the past 240 hour(s))  SARS CORONAVIRUS 2 (TAT 6-24 HRS) Nasopharyngeal Nasopharyngeal Swab     Status: None   Collection Time: 09/18/19 10:08 AM   Specimen: Nasopharyngeal Swab  Result  Value Ref Range Status   SARS Coronavirus 2 NEGATIVE NEGATIVE Final    Comment: (NOTE) SARS-CoV-2 target nucleic acids are NOT DETECTED. The SARS-CoV-2 RNA is generally detectable in upper and lower respiratory specimens during the acute phase of infection. Negative results do not preclude SARS-CoV-2 infection, do not rule out co-infections with other pathogens, and should not be used as the sole basis for treatment or other patient management decisions. Negative results must be combined with clinical observations, patient history, and epidemiological information. The expected result is Negative. Fact Sheet for Patients: SugarRoll.be Fact Sheet for Healthcare Providers: https://www.woods-mathews.com/ This test is not yet approved or cleared by the [place] FDA and  has been authorized for detection and/or diagnosis of SARS-CoV-2 by FDA under an Emergency Use Authorization (EUA). This EUA will remain  in effect (meaning this test can be used) for the duration of the COVID-19 declaration under Section 56 4(b)(1) of the Act, 21 U.S.C. section 360bbb-3(b)(1), unless the authorization is terminated or revoked sooner. Performed at [facility], [place] [address] [place], [place] [address]          Radiology Studies: No results found.      Scheduled Meds:  amiodarone  200 mg Oral Daily   apixaban  5 mg Oral BID   chlorhexidine  15 mL Mouth Rinse BID   clopidogrel  75 mg Oral Q breakfast   cyclobenzaprine  5 mg Oral TID   docusate sodium  100 mg Oral Daily   feeding supplement (  ENSURE ENLIVE)  237 mL Oral BID BM   latanoprost  1 drop Both Eyes QHS   levothyroxine  100 mcg Oral Q0600   lidocaine  1 patch Transdermal Q24H   mouth rinse  15 mL Mouth Rinse q12n4p   pantoprazole  40 mg Oral Daily   polyethylene glycol  17 g Oral BID   sodium chloride flush  3 mL Intravenous Q12H   Continuous Infusions:  sodium  chloride     sodium chloride 10 mL/hr at 09/18/19 1730     LOS: 22 days    Time spent: 35 min      Nicolette Bang, MD [facility]  If 7PM-7AM, please contact night-coverage  09/20/2019, 3:00 PM

## 2019-09-20 NOTE — Plan of Care (Signed)
  Problem: Education: Goal: Knowledge of General Education information will improve Description: Including pain rating scale, medication(s)/side effects and non-pharmacologic comfort measures Outcome: Progressing   Problem: Education: Goal: Knowledge of General Education information will improve Description: Including pain rating scale, medication(s)/side effects and non-pharmacologic comfort measures Outcome: Progressing   Problem: Health Behavior/Discharge Planning: Goal: Ability to manage health-related needs will improve Outcome: Progressing   Problem: Clinical Measurements: Goal: Ability to maintain clinical measurements within normal limits will improve Outcome: Progressing Goal: Will remain free from infection Outcome: Progressing Goal: Respiratory complications will improve Outcome: Progressing   Problem: Nutrition: Goal: Adequate nutrition will be maintained Outcome: Progressing   Problem: Coping: Goal: Level of anxiety will decrease Outcome: Progressing   Problem: Elimination: Goal: Will not experience complications related to bowel motility Outcome: Progressing Goal: Will not experience complications related to urinary retention Outcome: Progressing   Problem: Pain Managment: Goal: General experience of comfort will improve Outcome: Progressing   Problem: Safety: Goal: Ability to remain free from injury will improve Outcome: Progressing

## 2019-09-21 LAB — SARS CORONAVIRUS 2 (TAT 6-24 HRS): SARS Coronavirus 2: NEGATIVE

## 2019-09-21 LAB — GLUCOSE, CAPILLARY
Glucose-Capillary: 108 mg/dL — ABNORMAL HIGH (ref 70–99)
Glucose-Capillary: 113 mg/dL — ABNORMAL HIGH (ref 70–99)

## 2019-09-21 NOTE — Progress Notes (Addendum)
Physical Therapy Treatment Patient Details Name: Alyssa Odonnell MRN: [id number] DOB: Nov 14, 1933 Today's Date: 09/21/2019    History of Present Illness Pt is an 85 y.o. female admitted 08/28/19 with worsening epigastric pain. Worked up for large hiatal hernia with gastric outlet obstruction. S/p laparoscopic gastropexy with placement of gastrostomy tube with extensive lysis11/13. PMH includes afib, stroke, HTN, glaucoma. During admission, pt developed acute ischemia of left fourth and fifth toe and is now s/p arteriogram with stent placement 11-23, and has since had 2 units of blood trasnfused (11/24 and 11/25).    PT Comments    Patient seen for mobility progression. Pt is in bed upon arrival and agreeable to participate in therapy. Pt requires min A for bed mobility, mod A for sit to stand transfers, and min A for short distance gait. Pt tolerated gait distance of  ~8 ft this session. Gait limited by painful R knee and fatigue. Continue to progress as tolerated with anticipated d/c to SNF for further skilled PT services.     Follow Up Recommendations  SNF;Supervision for mobility/OOB     Equipment Recommendations  None recommended by PT    Recommendations for Other Services       Precautions / Restrictions Precautions Precautions: Fall Restrictions Weight Bearing Restrictions: No    Mobility  Bed Mobility Overal bed mobility: Needs Assistance Bed Mobility: Supine to Sit           General bed mobility comments: use of rail, HOB elevated, and assist to elevate trunk into sitting  Transfers Overall transfer level: Needs assistance Equipment used: Rolling walker (2 wheeled) Transfers: Sit to/from Stand Sit to Stand: Mod assist Stand pivot transfers: Min assist       General transfer comment: assist required to power up into standing; min A to pivot  Ambulation/Gait Ambulation/Gait assistance: Min assist Gait Distance (Feet): 8 Feet Assistive device: Rolling walker (2  wheeled) Gait Pattern/deviations: Step-through pattern;Decreased stride length;Trunk flexed;Antalgic Gait velocity: Decreased   General Gait Details: pt with flexed posture and decreased bilat step height/length; pt tolerated ~8 ft then required chair brought up behind due to painful R knee and fatigue   Stairs             Wheelchair Mobility    Modified Rankin (Stroke Patients Only)       Balance Overall balance assessment: Needs assistance Sitting-balance support: Feet supported Sitting balance-Leahy Scale: Fair     Standing balance support: Bilateral upper extremity supported;During functional activity Standing balance-Leahy Scale: Poor                              Cognition Arousal/Alertness: Awake/alert Behavior During Therapy: WFL for tasks assessed/performed;Anxious Overall Cognitive Status: Within Functional Limits for tasks assessed                                        Exercises      General Comments        Pertinent Vitals/Pain Pain Assessment: Faces Faces Pain Scale: Hurts whole lot Pain Location: bilat knees and R hip Pain Descriptors / Indicators: Moaning;Grimacing;Guarding Pain Intervention(s): Limited activity within patient's tolerance;Monitored during session;Repositioned    Home Living                      Prior Function  PT Goals (current goals can now be found in the care plan section) Progress towards PT goals: Progressing toward goals    Frequency    Min 2X/week      PT Plan Current plan remains appropriate    Co-evaluation              AM-PAC PT "6 Clicks" Mobility   Outcome Measure  Help needed turning from your back to your side while in a flat bed without using bedrails?: A Little Help needed moving from lying on your back to sitting on the side of a flat bed without using bedrails?: A Little Help needed moving to and from a bed to a chair (including a  wheelchair)?: A Lot Help needed standing up from a chair using your arms (e.g., wheelchair or bedside chair)?: A Lot Help needed to walk in hospital room?: A Lot Help needed climbing 3-5 steps with a railing? : Total 6 Click Score: 13    End of Session Equipment Utilized During Treatment: Gait belt;Oxygen(2L O2) Activity Tolerance: Patient limited by pain;Patient limited by fatigue Patient left: with call bell/phone within reach;in chair;with chair alarm set Nurse Communication: Mobility status PT Visit Diagnosis: Other abnormalities of gait and mobility (R26.89);Pain Pain - Right/Left: Left Pain - part of body: Knee     Time: [id number] PT Time Calculation (min) (ACUTE ONLY): 38 min  Charges:     Gait training: 23-37 min Therapeutic activity: 8-23 min                   Earney Navy, PTA Acute Rehabilitation Services Pager: [phone] Office: [phone]     Darliss Cheney 09/21/2019, 5:31 PM

## 2019-09-21 NOTE — Progress Notes (Signed)
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 CT scan of the abdomen showed large hiatal hernia with gastric volvulus and gastric outlet obstruction.Withmultiple unsuccessful attempts to pass NG tube at bedside as well under fluoroscopy. General surgery was consulted and he underwent last for scopic gastropexy on 09/01/2019 with placement of a 20 French gastrostomy tube and extensive adhesion lysis. During this hospitalization he underwent left lower extremity arteriogram and stenting.   Assessment & Plan:   Principal Problem:   Volvulus of stomach Active Problems:   PAD (peripheral artery disease) (HCC)   Essential hypertension   PAF (paroxysmal atrial fibrillation) (HCC)   Gastric outlet obstruction   Large hiatal hernia with gastric outlet obstruction: No changes,CT scan on admission show a large hiatal hernia with gastric outlet obstruction and volvulus. Underwent laparoscopic gastric pexy on 09/01/2019 with placement of a 20 French gastrostomy tube and lysis of adhesions. Very slow to recover Significant improvement in nausea and vomiting Tolerating diet Per general surgery will require EGD in 6 weeks.   Acute ischemic left fourth and fifth toe/peripheral vascular disease: His left fourth and fifth toe became cyanotic and painful he is known to have an SFA occlusion with three-vessel runoff in 2017. ABI showed moderate right lower extremity arterial disease. Vascular surgery was consulted he is status post arteriogram on 09/11/2019 with a stent to the left SFA and balloon angioplasty  of the trunk. Vascular recommended to come continue Eliquis we have added as per vascular recommendation Plavix.  Patient developed a small right groin hematoma at the catheter site.  Acute blood loss anemia/groin hematoma post arteriogram/anemia of chronic disease: With a hemoglobin9.4 11/29 Status post 2 unit of packed red blood cells on 09/13/2019. Currently on Eliquis. Will recheck CBC in the morning  Right hip pain: Suspect bursitis: Patient currently using lidocaine patch. Continue to titrate down IV morphine.  Hypokalemia: Now resolved. 4.5  Paroxysmal atrial fibrillation: Continue amiodarone and Eliquis. Still tachycardicmy evaluation this morning Cardiology was following  Essential hypertension: Blood pressure fairly controlled off antihypertensive medication continue to monitor.  Chronic kidney disease stage III: With a baseline creatinine of 1.3-1.5, patient was started on IV fluids. She is currently taking regular orals   Hypothyroidism: Continue Synthroid. Will need repeat thyroid studies in 4 to 6 weeks.  DVT prophylaxis: [id number]  Code Status: full    Code Status Orders  (From admission, onward)         Start     Ordered   08/29/19 0453  Full code  Continuous     08/29/19 0455        Code Status History    Date Active Date Inactive Code Status Order ID Comments User Context   03/09/2017 1835 03/11/2017 2355 Full Code [id number]  Mercy Riding, MD Inpatient   03/09/2017 1835 03/09/2017 [place] Full Code [id number]  Lovenia Kim, MD Inpatient   07/06/2016 1112 07/06/2016 1836 Full Code [id number]  Angelia Mould, MD Inpatient   Advance Care Planning Activity    Advance Directive Documentation     Most Recent Value  Type of Advance Directive  Living will  Pre-existing out of facility DNR order (yellow form or pink MOST form)  --  "MOST" Form in Place?  --     Family Communication: called brother, NA LM Disposition Plan:   Patient  remained inpatient for continued IV antiemetics, and postoperative recovery.  Patient not stable, not tolerating diet well, unsafe for discharge Consults called: None Admission status: Inpatient   Consultants:   Vascular surgery general surgery, cardiology  Procedures:  Ct Abdomen Pelvis Wo Contrast  Result Date: 08/29/2019 CLINICAL DATA:  Abdominal pain, acute EXAM: CT ABDOMEN AND PELVIS WITHOUT CONTRAST TECHNIQUE: Multidetector CT imaging of the abdomen and pelvis was performed following the standard protocol without IV contrast. COMPARISON:  August 03, 2016 FINDINGS: Lower chest: The visualized heart size within normal limits. No pericardial fluid/thickening. Again noted is a large hiatal hernia with almost the entirety of the stomach rotated and flipped upward is, consistent with a gastric volvulus. The portion of the herniated stomach is markedly dilated with air and food stuff which suggest gastric outlet obstruction. Hepatobiliary: Although limited due to the lack of intravenous contrast, normal in appearance without gross focal abnormality. The patient is status post cholecystectomy. No biliary ductal dilation. Pancreas:  Unremarkable.  No surrounding inflammatory changes. Spleen: Normal in size. Although limited due to the lack of intravenous contrast, normal in appearance. Adrenals/Urinary Tract: Both adrenal glands appear normal. No renal or collecting system calculi. There is left renal atrophy seen. The bladder is unremarkable. Stomach/Bowel: The small bowel and colon are normal in appearance. Scattered colonic diverticula are noted without diverticulitis. Vascular/Lymphatic: There are no enlarged abdominal or pelvic lymph nodes. Aortic Atherosclerosis (ICD10-I70.0). Reproductive: Calcified uterine fibroids are present. Other: No evidence of abdominal wall mass or hernia. Musculoskeletal: No acute or significant osseous findings. IMPRESSION: Large hiatal hernia with gastric volvulus as on  prior exam, however now there is a markedly dilated debris-filled stomach which is suggestive of gastric outlet obstruction. Electronically Signed   By: Prudencio Pair M.D.   On: 08/29/2019 03:13   Dg Abd 1 View  Result Date: 08/31/2019 CLINICAL DATA:  NG tube placement. EXAM: ABDOMEN - 1 VIEW COMPARISON:  08/21/2019 FINDINGS: The nasogastric tube is been retracted slightly since the prior exam. It is coiled within the known intrathoracic stomach. Cardiac leads and tubing over the chest. Cardiomediastinal contours are unchanged. IMPRESSION: The nasogastric tube is been retracted slightly since the prior exam. It is coiled within the known intrathoracic stomach. No other interval changes on this limited fluoroscopic assessment. Electronically Signed   By: Zetta Bills M.D.   On: 08/31/2019 17:30   Dg Abd 1 View  Result Date: 08/29/2019 CLINICAL DATA:  NG tube placement. EXAM: ABDOMEN - 1 VIEW; DG NASO G TUBE PLC W/FL-NO RAD COMPARISON:  CT 08/29/2019. FINDINGS: NG tube noted with tip coiled in the large hiatal hernia. Cardiac monitor noted chest. Cardiac leads and tubing noted over the chest. IMPRESSION: NG tube noted with its tip coiled in the patient's known large hiatal hernia. Electronically Signed   By: Marcello Moores  Register   On: 08/29/2019 12:59   Dg Chest Portable 1 View  Result Date: 08/29/2019 CLINICAL DATA:  Chest pain and shortness of breath EXAM: PORTABLE CHEST 1 VIEW COMPARISON:  08/03/2016 FINDINGS: Cardiac shadow is stable. Aortic calcifications are seen. Loop recorder is noted on the left. Large hiatal hernia is again seen but increased when compared with the prior study. No focal infiltrate or sizable effusion is seen. No bony abnormality is noted. IMPRESSION: Changes  consistent with large hiatal hernia which is increased in size from the prior study. No new focal abnormality is noted. Electronically Signed   By: Inez Catalina M.D.   On: 08/29/2019 00:30   Dg [place] Dys Tube Plc W/fl-no  Rad  Result Date: 09/18/2019 Fluoroscopy was utilized by the requesting physician.  No radiographic interpretation.   Dg Naso G Tube Plc W/fl-no Rad  Result Date: 08/29/2019 CLINICAL DATA:  NG tube placement. EXAM: ABDOMEN - 1 VIEW; DG NASO G TUBE PLC W/FL-NO RAD COMPARISON:  CT 08/29/2019. FINDINGS: NG tube noted with tip coiled in the large hiatal hernia. Cardiac monitor noted chest. Cardiac leads and tubing noted over the chest. IMPRESSION: NG tube noted with its tip coiled in the patient's known large hiatal hernia. Electronically Signed   By: Marcello Moores  Register   On: 08/29/2019 12:59   Vas [place] Groin Pseudoaneurysm  Result Date: 09/18/2019  ARTERIAL PSEUDOANEURYSM  Exam: Right groin Indications: Patient complains of groin pain. Comparison Study: No prior study on file Performing Technologist: Sharion Dove RVS  Examination Guidelines: A complete evaluation includes B-mode imaging, spectral Doppler, color Doppler, and power Doppler as needed of all accessible portions of each vessel. Bilateral testing is considered an integral part of a complete examination. Limited examinations for reoccurring indications may be performed as noted.  Summary: No pseudoaneurysm noted in the right groin. There is hematoma noted in the groin and in the proximal thigh. Diagnosing physician: Ruta Hinds MD Electronically signed by Ruta Hinds MD on 09/18/2019 at 3:44:35 PM.   --------------------------------------------------------------------------------    Final    Vas [place] Abi With/wo Tbi  Result Date: 09/07/2019 LOWER EXTREMITY DOPPLER STUDY Indications: Discoloration and pain. High Risk Factors: Hypertension, hyperlipidemia, prior CVA.  Comparison Study: 11/04/17 previous Performing Technologist: Abram Sander RVS  Examination Guidelines: A complete evaluation includes at minimum, Doppler waveform signals and systolic blood pressure reading at the level of bilateral brachial, anterior tibial, and posterior  tibial arteries, when vessel segments are accessible. Bilateral testing is considered an integral part of a complete examination. Photoelectric Plethysmograph (PPG) waveforms and toe systolic pressure readings are included as required and additional duplex testing as needed. Limited examinations for reoccurring indications may be performed as noted.  ABI Findings: +--------+------------------+-----+---------+--------+  Right    Rt Pressure (mmHg) Index Waveform  Comment   +--------+------------------+-----+---------+--------+  Brachial 137                      triphasic           +--------+------------------+-----+---------+--------+  PTA      78                 0.57  biphasic            +--------+------------------+-----+---------+--------+  DP       82                 0.60  biphasic            +--------+------------------+-----+---------+--------+ +--------+------------------+-----+--------+--------------------+  Left     Lt Pressure (mmHg) Index Waveform Comment               +--------+------------------+-----+--------+--------------------+  Brachial                          biphasic restricted extremity  +--------+------------------+-----+--------+--------------------+  PTA      70  0.51  biphasic                       +--------+------------------+-----+--------+--------------------+  DP       61                 0.45  biphasic                       +--------+------------------+-----+--------+--------------------+ +-------+-----------+-----------+------------+------------+  ABI/TBI Today's ABI Today's TBI Previous ABI Previous TBI  +-------+-----------+-----------+------------+------------+  Right   0.60                                               +-------+-----------+-----------+------------+------------+  Left    0.51                                               +-------+-----------+-----------+------------+------------+  Summary: Right: Resting right ankle-brachial index indicates moderate  right lower extremity arterial disease. Left: Resting left ankle-brachial index indicates moderate left lower extremity arterial disease.  *See table(s) above for measurements and observations.  Electronically signed by Monica Martinez MD on 09/07/2019 at 7:15:20 PM.   Final      Antimicrobials:   None   Subjective: Reports feeling significantly better this morning tolerating diet no complaints  Objective: Vitals:   09/21/19 0405 09/21/19 0455 09/21/19 0900 09/21/19 1126  BP: 106/73  104/68   Pulse: (!) 108 62 (!) 112 (!) 104  Resp: (!) 23 (!) 22 (!) 24 (!) 22  Temp: 97.8 F (36.6 C)     TempSrc: Oral     SpO2: 100% (!) 71% (!) 89% 100%  Weight: 75.8 kg     Height:        Intake/Output Summary (Last 24 hours) at 09/21/2019 1217 Last data filed at 09/21/2019 0900 Gross per 24 hour  Intake 100 ml  Output 300 ml  Net -200 ml   Filed Weights   09/19/19 0545 09/20/19 0529 09/21/19 0405  Weight: 75.5 kg 75.4 kg 75.8 kg    Examination:  General exam:Appears calm and comfortable  Respiratory system: Clear to auscultation. Respiratory effort normal. Cardiovascular system:Irregularly irregular mildly elevated rate in the low 100s  gastrointestinal system:Abdomen is nondistended, soft and nontender. No organomegaly or masses felt. Normal bowel sounds heard.  No evidence of postoperative complications Central nervous system:Alert No focal neurological deficits. Extremities:Warm well perfused no contractures. Skin: No rashes, lesions or ulcersno hematomas noted Psychiatry:Judgement and insight appear normal. Mood &affect appropriate. .     Data Reviewed: I have personally reviewed following labs and imaging studies  CBC: Recent Labs  Lab 09/15/19 0333 09/16/19 0333 09/17/19 0354  WBC 9.7 8.5 8.5  NEUTROABS 7.0 5.8 5.6  HGB 8.7* 8.9* 9.4*  HCT 26.6* 27.8* 29.1*  MCV 91.7 92.1 91.8  PLT 228 225 [id number]   Basic Metabolic Panel: Recent Labs  Lab 09/16/19 0333  09/17/19 0354 09/18/19 0352 09/19/19 0426 09/20/19 1130  NA 137 136 136 136 136  K 4.2 4.3 4.2 4.6 4.5  CL 101 100 100 101 98  CO2 24 22 22 25 27   GLUCOSE 109* 106* 106* 123* 122*  BUN 16 20 28* 28* 22  CREATININE 1.38* 1.20* 1.21* 1.22*  1.01*  CALCIUM 8.4* 8.4* 8.2* 8.4* 8.8*  MG  --  2.0  --   --   --   PHOS  --  4.2  --   --   --    GFR: Estimated Creatinine Clearance: 41.2 mL/min (A) (by C-G formula based on SCr of 1.01 mg/dL (H)). Liver Function Tests: No results for input(s): AST, ALT, ALKPHOS, BILITOT, PROT, ALBUMIN in the last 168 hours. No results for input(s): LIPASE, AMYLASE in the last 168 hours. No results for input(s): AMMONIA in the last 168 hours. Coagulation Profile: No results for input(s): INR, PROTIME in the last 168 hours. Cardiac Enzymes: No results for input(s): CKTOTAL, CKMB, CKMBINDEX, TROPONINI in the last 168 hours. BNP (last 3 results) No results for input(s): PROBNP in the last 8760 hours. HbA1C: No results for input(s): HGBA1C in the last 72 hours. CBG: Recent Labs  Lab 09/19/19 1745 09/20/19 0017 09/20/19 1119 09/20/19 2251 09/21/19 0758  GLUCAP 89 111* 110* 111* 113*   Lipid Profile: No results for input(s): CHOL, HDL, LDLCALC, TRIG, CHOLHDL, LDLDIRECT in the last 72 hours. Thyroid Function Tests: No results for input(s): TSH, T4TOTAL, FREET4, T3FREE, THYROIDAB in the last 72 hours. Anemia Panel: No results for input(s): VITAMINB12, FOLATE, FERRITIN, TIBC, IRON, RETICCTPCT in the last 72 hours. Sepsis Labs: No results for input(s): PROCALCITON, LATICACIDVEN in the last 168 hours.  Recent Results (from the past 240 hour(s))  SARS CORONAVIRUS 2 (TAT 6-24 HRS) Nasopharyngeal Nasopharyngeal Swab     Status: None   Collection Time: 09/18/19 10:08 AM   Specimen: Nasopharyngeal Swab  Result Value Ref Range Status   SARS Coronavirus 2 NEGATIVE NEGATIVE Final    Comment: (NOTE) SARS-CoV-2 target nucleic acids are NOT DETECTED. The  SARS-CoV-2 RNA is generally detectable in upper and lower respiratory specimens during the acute phase of infection. Negative results do not preclude SARS-CoV-2 infection, do not rule out co-infections with other pathogens, and should not be used as the sole basis for treatment or other patient management decisions. Negative results must be combined with clinical observations, patient history, and epidemiological information. The expected result is Negative. Fact Sheet for Patients: SugarRoll.be Fact Sheet for Healthcare Providers: https://www.woods-mathews.com/ This test is not yet approved or cleared by the [place] FDA and  has been authorized for detection and/or diagnosis of SARS-CoV-2 by FDA under an Emergency Use Authorization (EUA). This EUA will remain  in effect (meaning this test can be used) for the duration of the COVID-19 declaration under Section 56 4(b)(1) of the Act, 21 U.S.C. section 360bbb-3(b)(1), unless the authorization is terminated or revoked sooner. Performed at [facility], [place] [address], [place], [place] [address]          Radiology Studies: No results found.      Scheduled Meds:  amiodarone  200 mg Oral Daily   apixaban  5 mg Oral BID   chlorhexidine  15 mL Mouth Rinse BID   clopidogrel  75 mg Oral Q breakfast   cyclobenzaprine  5 mg Oral TID   docusate sodium  100 mg Oral Daily   feeding supplement (ENSURE ENLIVE)  237 mL Oral BID BM   latanoprost  1 drop Both Eyes QHS   levothyroxine  100 mcg Oral Q0600   lidocaine  1 patch Transdermal Q24H   mouth rinse  15 mL Mouth Rinse q12n4p   pantoprazole  40 mg Oral Daily   polyethylene glycol  17 g Oral BID   sodium  chloride flush  3 mL Intravenous Q12H   Continuous Infusions:  sodium chloride     sodium chloride 10 mL/hr at 09/18/19 1730     LOS: 23 days    Time spent: [place], MD [facility]  If 7PM-7AM, please contact night-coverage  09/21/2019, 12:17 PM

## 2019-09-21 NOTE — Plan of Care (Signed)
  Problem: Education: Goal: Knowledge of General Education information will improve Description: Including pain rating scale, medication(s)/side effects and non-pharmacologic comfort measures Outcome: Progressing   Problem: Education: Goal: Knowledge of General Education information will improve Description: Including pain rating scale, medication(s)/side effects and non-pharmacologic comfort measures Outcome: Progressing   Problem: Health Behavior/Discharge Planning: Goal: Ability to manage health-related needs will improve Outcome: Progressing   Problem: Clinical Measurements: Goal: Ability to maintain clinical measurements within normal limits will improve Outcome: Progressing Goal: Will remain free from infection Outcome: Progressing Goal: Respiratory complications will improve Outcome: Progressing Goal: Cardiovascular complication will be avoided Outcome: Progressing   Problem: Activity: Goal: Risk for activity intolerance will decrease Outcome: Progressing   Problem: Nutrition: Goal: Adequate nutrition will be maintained Outcome: Progressing   Problem: Coping: Goal: Level of anxiety will decrease Outcome: Progressing   Problem: Elimination: Goal: Will not experience complications related to bowel motility Outcome: Progressing Goal: Will not experience complications related to urinary retention Outcome: Progressing   Problem: Pain Managment: Goal: General experience of comfort will improve Outcome: Progressing   Problem: Safety: Goal: Ability to remain free from injury will improve Outcome: Progressing   Problem: Education: Goal: Knowledge of General Education information will improve Description: Including pain rating scale, medication(s)/side effects and non-pharmacologic comfort measures Outcome: Progressing

## 2019-09-22 LAB — GLUCOSE, CAPILLARY
Glucose-Capillary: 111 mg/dL — ABNORMAL HIGH (ref 70–99)
Glucose-Capillary: 113 mg/dL — ABNORMAL HIGH (ref 70–99)

## 2019-09-22 NOTE — Progress Notes (Signed)
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 CT scan of the abdomen showed large hiatal hernia with gastric volvulus and gastric outlet obstruction.Withmultiple unsuccessful attempts to pass NG tube at bedside as well under fluoroscopy. General surgery was consulted and he underwent last for scopic gastropexy on 09/01/2019 with placement of a 20 French gastrostomy tube and extensive adhesion lysis. During this hospitalization he underwent left lower extremity arteriogram and stenting.   Assessment & Plan:   Principal Problem:   Volvulus of stomach Active Problems:   PAD (peripheral artery disease) (HCC)   Essential hypertension   PAF (paroxysmal atrial fibrillation) (HCC)   Gastric outlet obstruction   Large hiatal hernia with gastric outlet obstruction: No changes,CT scan on admission show a large hiatal hernia with gastric outlet obstruction and volvulus. Underwent laparoscopic gastric pexy on 09/01/2019 with placement of a 20 French gastrostomy tube and lysis of adhesions. Very slow to recover Significant improvement in nausea and vomiting Tolerating diet Per general surgery will require EGD in 6 weeks.   Acute ischemic left fourth and fifth toe/peripheral vascular disease: His left fourth and fifth toe became cyanotic and painful he is known to have an SFA occlusion with three-vessel runoff in 2017. ABI showed moderate right lower extremity arterial disease. Vascular surgery was consulted he is status post arteriogram on 09/11/2019 with a stent to the left SFA and balloon angioplasty  of the trunk. Vascular recommended to come continue Eliquis we have added as per vascular recommendation Plavix.  Patient developed a small right groin hematoma at the catheter site.  Acute blood loss anemia/groin hematoma post arteriogram/anemia of chronic disease: With a hemoglobin9.4 11/29 Status post 2 unit of packed red blood cells on 09/13/2019. Currently on Eliquis.  Right hip pain: Suspect bursitis: Patient currently using lidocaine patch. Continue to titrate down IV morphine.  Hypokalemia: Now resolved. 4.5  Paroxysmal atrial fibrillation: Continue amiodarone and Eliquis. Still tachycardic this morning Cardiology was following  Essential hypertension: Blood pressure fairly controlled off antihypertensive medication continue to monitor.  Chronic kidney disease stage III: With a baseline creatinine of 1.3-1.5, patient was started on IV fluids. She is currently taking regular orals   Hypothyroidism: Continue Synthroid. Will need repeat thyroid studies in 4 to 6 weeks.  DVT prophylaxis: [id number]  Code Status: full    Code Status Orders  (From admission, onward)         Start     Ordered   08/29/19 0453  Full code  Continuous     08/29/19 0455        Code Status History    Date Active Date Inactive Code Status Order ID Comments User Context   03/09/2017 1835 03/11/2017 2355 Full Code [id number]  Mercy Riding, MD Inpatient   03/09/2017 1835 03/09/2017 [place] Full Code [id number]  Lovenia Kim, MD Inpatient   07/06/2016 1112 07/06/2016 1836 Full Code [id number]  Angelia Mould, MD Inpatient   Advance Care Planning Activity    Advance Directive Documentation     Most Recent Value  Type of Advance Directive  Living will  Pre-existing out of facility DNR order (yellow  form or pink MOST form)  --  "MOST" Form in Place?  --     Family Communication: called brother, NA LM Disposition Plan:   Patient remained inpatient for continued IV  antiemetics, and postoperative recovery.  Patient not stable, not tolerating diet well, unsafe for discharge Consults called: None Admission status: Inpatient   Consultants:   Vascular surgery general surgery, cardiology  Procedures:  Ct Abdomen Pelvis Wo Contrast  Result Date: 08/29/2019 CLINICAL DATA:  Abdominal pain, acute EXAM: CT ABDOMEN AND PELVIS WITHOUT CONTRAST TECHNIQUE: Multidetector CT imaging of the abdomen and pelvis was performed following the standard protocol without IV contrast. COMPARISON:  August 03, 2016 FINDINGS: Lower chest: The visualized heart size within normal limits. No pericardial fluid/thickening. Again noted is a large hiatal hernia with almost the entirety of the stomach rotated and flipped upward is, consistent with a gastric volvulus. The portion of the herniated stomach is markedly dilated with air and food stuff which suggest gastric outlet obstruction. Hepatobiliary: Although limited due to the lack of intravenous contrast, normal in appearance without gross focal abnormality. The patient is status post cholecystectomy. No biliary ductal dilation. Pancreas:  Unremarkable.  No surrounding inflammatory changes. Spleen: Normal in size. Although limited due to the lack of intravenous contrast, normal in appearance. Adrenals/Urinary Tract: Both adrenal glands appear normal. No renal or collecting system calculi. There is left renal atrophy seen. The bladder is unremarkable. Stomach/Bowel: The small bowel and colon are normal in appearance. Scattered colonic diverticula are noted without diverticulitis. Vascular/Lymphatic: There are no enlarged abdominal or pelvic lymph nodes. Aortic Atherosclerosis (ICD10-I70.0). Reproductive: Calcified uterine fibroids are present. Other: No evidence of abdominal wall mass or hernia. Musculoskeletal: No acute or significant osseous findings. IMPRESSION: Large hiatal hernia with gastric volvulus as on prior exam, however now there is a  markedly dilated debris-filled stomach which is suggestive of gastric outlet obstruction. Electronically Signed   By: Prudencio Pair M.D.   On: 08/29/2019 03:13   Dg Abd 1 View  Result Date: 08/31/2019 CLINICAL DATA:  NG tube placement. EXAM: ABDOMEN - 1 VIEW COMPARISON:  08/21/2019 FINDINGS: The nasogastric tube is been retracted slightly since the prior exam. It is coiled within the known intrathoracic stomach. Cardiac leads and tubing over the chest. Cardiomediastinal contours are unchanged. IMPRESSION: The nasogastric tube is been retracted slightly since the prior exam. It is coiled within the known intrathoracic stomach. No other interval changes on this limited fluoroscopic assessment. Electronically Signed   By: Zetta Bills M.D.   On: 08/31/2019 17:30   Dg Abd 1 View  Result Date: 08/29/2019 CLINICAL DATA:  NG tube placement. EXAM: ABDOMEN - 1 VIEW; DG NASO G TUBE PLC W/FL-NO RAD COMPARISON:  CT 08/29/2019. FINDINGS: NG tube noted with tip coiled in the large hiatal hernia. Cardiac monitor noted chest. Cardiac leads and tubing noted over the chest. IMPRESSION: NG tube noted with its tip coiled in the patient's known large hiatal hernia. Electronically Signed   By: Marcello Moores  Register   On: 08/29/2019 12:59   Dg Chest Portable 1 View  Result Date: 08/29/2019 CLINICAL DATA:  Chest pain and shortness of breath EXAM: PORTABLE CHEST 1 VIEW COMPARISON:  08/03/2016 FINDINGS: Cardiac shadow is stable. Aortic calcifications are seen. Loop recorder is noted on the left. Large hiatal hernia is again seen but increased when compared with the prior study. No focal infiltrate or sizable effusion is seen. No bony abnormality is noted. IMPRESSION: Changes consistent with large hiatal hernia which is  increased in size from the prior study. No new focal abnormality is noted. Electronically Signed   By: Inez Catalina M.D.   On: 08/29/2019 00:30   Dg [place] Dys Tube Plc W/fl-no Rad  Result Date:  09/18/2019 Fluoroscopy was utilized by the requesting physician.  No radiographic interpretation.   Dg Naso G Tube Plc W/fl-no Rad  Result Date: 08/29/2019 CLINICAL DATA:  NG tube placement. EXAM: ABDOMEN - 1 VIEW; DG NASO G TUBE PLC W/FL-NO RAD COMPARISON:  CT 08/29/2019. FINDINGS: NG tube noted with tip coiled in the large hiatal hernia. Cardiac monitor noted chest. Cardiac leads and tubing noted over the chest. IMPRESSION: NG tube noted with its tip coiled in the patient's known large hiatal hernia. Electronically Signed   By: Marcello Moores  Register   On: 08/29/2019 12:59   Vas [place] Groin Pseudoaneurysm  Result Date: 09/18/2019  ARTERIAL PSEUDOANEURYSM  Exam: Right groin Indications: Patient complains of groin pain. Comparison Study: No prior study on file Performing Technologist: Sharion Dove RVS  Examination Guidelines: A complete evaluation includes B-mode imaging, spectral Doppler, color Doppler, and power Doppler as needed of all accessible portions of each vessel. Bilateral testing is considered an integral part of a complete examination. Limited examinations for reoccurring indications may be performed as noted.  Summary: No pseudoaneurysm noted in the right groin. There is hematoma noted in the groin and in the proximal thigh. Diagnosing physician: Ruta Hinds MD Electronically signed by Ruta Hinds MD on 09/18/2019 at 3:44:35 PM.   --------------------------------------------------------------------------------    Final    Vas [place] Abi With/wo Tbi  Result Date: 09/07/2019 LOWER EXTREMITY DOPPLER STUDY Indications: Discoloration and pain. High Risk Factors: Hypertension, hyperlipidemia, prior CVA.  Comparison Study: 11/04/17 previous Performing Technologist: Abram Sander RVS  Examination Guidelines: A complete evaluation includes at minimum, Doppler waveform signals and systolic blood pressure reading at the level of bilateral brachial, anterior tibial, and posterior tibial arteries, when  vessel segments are accessible. Bilateral testing is considered an integral part of a complete examination. Photoelectric Plethysmograph (PPG) waveforms and toe systolic pressure readings are included as required and additional duplex testing as needed. Limited examinations for reoccurring indications may be performed as noted.  ABI Findings: +--------+------------------+-----+---------+--------+  Right    Rt Pressure (mmHg) Index Waveform  Comment   +--------+------------------+-----+---------+--------+  Brachial 137                      triphasic           +--------+------------------+-----+---------+--------+  PTA      78                 0.57  biphasic            +--------+------------------+-----+---------+--------+  DP       82                 0.60  biphasic            +--------+------------------+-----+---------+--------+ +--------+------------------+-----+--------+--------------------+  Left     Lt Pressure (mmHg) Index Waveform Comment               +--------+------------------+-----+--------+--------------------+  Brachial                          biphasic restricted extremity  +--------+------------------+-----+--------+--------------------+  PTA      70                 0.51  biphasic                       +--------+------------------+-----+--------+--------------------+  DP       61                 0.45  biphasic                       +--------+------------------+-----+--------+--------------------+ +-------+-----------+-----------+------------+------------+  ABI/TBI Today's ABI Today's TBI Previous ABI Previous TBI  +-------+-----------+-----------+------------+------------+  Right   0.60                                               +-------+-----------+-----------+------------+------------+  Left    0.51                                               +-------+-----------+-----------+------------+------------+  Summary: Right: Resting right ankle-brachial index indicates moderate right lower extremity  arterial disease. Left: Resting left ankle-brachial index indicates moderate left lower extremity arterial disease.  *See table(s) above for measurements and observations.  Electronically signed by Monica Martinez MD on 09/07/2019 at 7:15:20 PM.   Final     Antimicrobials:   None   Subjective: Patient reports difficulty tolerating p.o. diet this morning.  She denies any nausea, vomiting but did endorse some mild abdominal pain.  Objective: Vitals:   09/21/19 1400 09/21/19 2144 09/22/19 0220 09/22/19 0621  BP: 116/64 123/72  119/80  Pulse:  (!) 116 98 (!) 105  Resp:   17   Temp: 98.1 F (36.7 C) 98.2 F (36.8 C)  98.1 F (36.7 C)  TempSrc: Oral Oral  Oral  SpO2:  97% 100% 100%  Weight:    76.9 kg  Height:        Intake/Output Summary (Last 24 hours) at 09/22/2019 1443 Last data filed at 09/22/2019 0620 Gross per 24 hour  Intake --  Output 700 ml  Net -700 ml   Filed Weights   09/20/19 0529 09/21/19 0405 09/22/19 0621  Weight: 75.4 kg 75.8 kg 76.9 kg    Examination:  General exam:Appears calm and comfortable  Respiratory system: Clear to auscultation. Respiratory effort normal. Cardiovascular system:Irregularly irregular mildly elevated rate in the low 100s  gastrointestinal system:Abdomen is nondistended, soft and nontender. No organomegaly or masses felt. Normal bowel sounds heard.  No evidence of postoperative complications and drain is in place with dressing clean dry and intact Central nervous system:Alert No focal neurological deficits. Extremities:Warm well perfused no contractures. Skin: No rashes, lesions or ulcersno hematomas noted Psychiatry:Judgement and insight appear normal. Mood &affect appropriate. .     Data Reviewed: I have personally reviewed following labs and imaging studies  CBC: Recent Labs  Lab 09/16/19 0333 09/17/19 0354  WBC 8.5 8.5  NEUTROABS 5.8 5.6  HGB 8.9* 9.4*  HCT 27.8* 29.1*  MCV 92.1 91.8  PLT 225 [id number]   Basic  Metabolic Panel: Recent Labs  Lab 09/16/19 0333 09/17/19 0354 09/18/19 0352 09/19/19 0426 09/20/19 1130  NA 137 136 136 136 136  K 4.2 4.3 4.2 4.6 4.5  CL 101 100 100 101 98  CO2 24 22 22 25 27   GLUCOSE 109* 106* 106* 123* 122*  BUN 16 20 28* 28* 22  CREATININE 1.38* 1.20* 1.21* 1.22* 1.01*  CALCIUM 8.4* 8.4* 8.2* 8.4* 8.8*  MG  --  2.0  --   --   --  PHOS  --  4.2  --   --   --    GFR: Estimated Creatinine Clearance: 41.4 mL/min (A) (by C-G formula based on SCr of 1.01 mg/dL (H)). Liver Function Tests: No results for input(s): AST, ALT, ALKPHOS, BILITOT, PROT, ALBUMIN in the last 168 hours. No results for input(s): LIPASE, AMYLASE in the last 168 hours. No results for input(s): AMMONIA in the last 168 hours. Coagulation Profile: No results for input(s): INR, PROTIME in the last 168 hours. Cardiac Enzymes: No results for input(s): CKTOTAL, CKMB, CKMBINDEX, TROPONINI in the last 168 hours. BNP (last 3 results) No results for input(s): PROBNP in the last 8760 hours. HbA1C: No results for input(s): HGBA1C in the last 72 hours. CBG: Recent Labs  Lab 09/20/19 1119 09/20/19 2251 09/21/19 0758 09/21/19 2343 09/22/19 0803  GLUCAP 110* 111* 113* 108* 111*   Lipid Profile: No results for input(s): CHOL, HDL, LDLCALC, TRIG, CHOLHDL, LDLDIRECT in the last 72 hours. Thyroid Function Tests: No results for input(s): TSH, T4TOTAL, FREET4, T3FREE, THYROIDAB in the last 72 hours. Anemia Panel: No results for input(s): VITAMINB12, FOLATE, FERRITIN, TIBC, IRON, RETICCTPCT in the last 72 hours. Sepsis Labs: No results for input(s): PROCALCITON, LATICACIDVEN in the last 168 hours.  Recent Results (from the past 240 hour(s))  SARS CORONAVIRUS 2 (TAT 6-24 HRS) Nasopharyngeal Nasopharyngeal Swab     Status: None   Collection Time: 09/18/19 10:08 AM   Specimen: Nasopharyngeal Swab  Result Value Ref Range Status   SARS Coronavirus 2 NEGATIVE NEGATIVE Final    Comment:  (NOTE) SARS-CoV-2 target nucleic acids are NOT DETECTED. The SARS-CoV-2 RNA is generally detectable in upper and lower respiratory specimens during the acute phase of infection. Negative results do not preclude SARS-CoV-2 infection, do not rule out co-infections with other pathogens, and should not be used as the sole basis for treatment or other patient management decisions. Negative results must be combined with clinical observations, patient history, and epidemiological information. The expected result is Negative. Fact Sheet for Patients: SugarRoll.be Fact Sheet for Healthcare Providers: https://www.woods-mathews.com/ This test is not yet approved or cleared by the [place] FDA and  has been authorized for detection and/or diagnosis of SARS-CoV-2 by FDA under an Emergency Use Authorization (EUA). This EUA will remain  in effect (meaning this test can be used) for the duration of the COVID-19 declaration under Section 56 4(b)(1) of the Act, 21 U.S.C. section 360bbb-3(b)(1), unless the authorization is terminated or revoked sooner. Performed at [facility], [place] [address], [place], [place] [address]   SARS CORONAVIRUS 2 (TAT 6-24 HRS) Nasopharyngeal Nasopharyngeal Swab     Status: None   Collection Time: 09/21/19  2:30 PM   Specimen: Nasopharyngeal Swab  Result Value Ref Range Status   SARS Coronavirus 2 NEGATIVE NEGATIVE Final    Comment: (NOTE) SARS-CoV-2 target nucleic acids are NOT DETECTED. The SARS-CoV-2 RNA is generally detectable in upper and lower respiratory specimens during the acute phase of infection. Negative results do not preclude SARS-CoV-2 infection, do not rule out co-infections with other pathogens, and should not be used as the sole basis for treatment or other patient management decisions. Negative results must be combined with clinical observations, patient history, and epidemiological information. The  expected result is Negative. Fact Sheet for Patients: SugarRoll.be Fact Sheet for Healthcare Providers: https://www.woods-mathews.com/ This test is not yet approved or cleared by the [place] FDA and  has been authorized for detection and/or diagnosis of SARS-CoV-2 by FDA under  an Emergency Use Authorization (EUA). This EUA will remain  in effect (meaning this test can be used) for the duration of the COVID-19 declaration under Section 56 4(b)(1) of the Act, 21 U.S.C. section 360bbb-3(b)(1), unless the authorization is terminated or revoked sooner. Performed at [facility], [place] [address] [place], [place] [address]          Radiology Studies: No results found.      Scheduled Meds:  amiodarone  200 mg Oral Daily   apixaban  5 mg Oral BID   chlorhexidine  15 mL Mouth Rinse BID   clopidogrel  75 mg Oral Q breakfast   cyclobenzaprine  5 mg Oral TID   docusate sodium  100 mg Oral Daily   feeding supplement (ENSURE ENLIVE)  237 mL Oral BID BM   latanoprost  1 drop Both Eyes QHS   levothyroxine  100 mcg Oral Q0600   lidocaine  1 patch Transdermal Q24H   mouth rinse  15 mL Mouth Rinse q12n4p   pantoprazole  40 mg Oral Daily   polyethylene glycol  17 g Oral BID   sodium chloride flush  3 mL Intravenous Q12H   Continuous Infusions:  sodium chloride     sodium chloride Stopped (09/22/19 0855)     LOS: 24 days    Time spent: [place], MD [facility]  If 7PM-7AM, please contact night-coverage  09/22/2019, 2:43 PM

## 2019-09-22 NOTE — TOC Progression Note (Signed)
Transition of Care [facility]) - Progression Note    Patient Details  Name: ZAELEIGH BOK MRN: [id number] Date of Birth: 01/31/1934  Transition [facility] CM/SW Contact  Eileen Stanford, LCSW Phone Number: 09/22/2019, 3:52 PM  Clinical Narrative:   Pt's auth expired. CSW submitted for auth today.    Expected Discharge Plan: [place] Barriers to Discharge: Continued Medical Work up  Expected Discharge Plan and Services Expected Discharge Plan: [place] In-house Referral: NA   Post Acute Care Choice: [place] Living arrangements for the past 2 months: Single Family Home                                       Social Determinants of Health (SDOH) Interventions    Readmission Risk Interventions No flowsheet data found.

## 2019-09-23 LAB — GLUCOSE, CAPILLARY
Glucose-Capillary: 102 mg/dL — ABNORMAL HIGH (ref 70–99)
Glucose-Capillary: 114 mg/dL — ABNORMAL HIGH (ref 70–99)
Glucose-Capillary: 96 mg/dL (ref 70–99)

## 2019-09-23 NOTE — Progress Notes (Signed)
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 CT scan of the abdomen showed large hiatal hernia with gastric volvulus and gastric outlet obstruction.Withmultiple unsuccessful attempts to pass NG tube at bedside as well under fluoroscopy. General surgery was consulted and he underwent last for scopic gastropexy on 09/01/2019 with placement of a 20 French gastrostomy tube and extensive adhesion lysis. During this hospitalization he underwent left lower extremity arteriogram and stenting.   Assessment & Plan:   Principal Problem:   Volvulus of stomach Active Problems:   PAD (peripheral artery disease) (HCC)   Essential hypertension   PAF (paroxysmal atrial fibrillation) (HCC)   Gastric outlet obstruction   Large hiatal hernia with gastric outlet obstruction: No changes,CT scan on admission show a large hiatal hernia with gastric outlet obstruction and volvulus. Underwent laparoscopic gastric pexy on 09/01/2019 with placement of a 20 French gastrostomy tube and lysis of adhesions. Very slow to recover Significant improvement in nausea and vomiting Tolerating diet Per general surgery will require EGD in 6 weeks.   Acute ischemic left fourth and fifth toe/peripheral vascular disease: His left fourth and fifth toe became cyanotic and painful he is known to have an SFA occlusion with three-vessel runoff in 2017. ABI showed moderate right lower extremity arterial disease. Vascular surgery was consulted he is status post arteriogram on 09/11/2019 with a stent to the left SFA and balloon angioplasty  of the trunk. Vascular recommended to come continue Eliquis.  She is also on Plavix.  Patient developed a small right groin hematoma at the catheter site which causes her pain at times. Encouraged her to continue PT and to massage the area TID to promote healing.  Acute blood loss anemia/groin hematoma post arteriogram/anemia of chronic disease: With a hemoglobin9.4 11/29 Status post 2 unit of packed red blood cells on 09/13/2019. Currently on Eliquis. Hgb stable, monitor intermittently. No active bleeding.   Right hip pain: Suspect bursitis: Patient currently using lidocaine patch. Continue to titrate down IV morphine.  Hypokalemia: Now resolved. 4.5  Paroxysmal atrial fibrillation: Continue amiodarone and Eliquis. Cardiology was following  Essential hypertension: Blood pressure fairly controlled off antihypertensive medication continue to monitor.  Chronic kidney disease stage III: With a baseline creatinine of 1.3-1.5, patient was started on IV fluids. She is currently taking regular oral intake, no longer on IV fluids.    Hypothyroidism: Continue Synthroid. Will need repeat thyroid studies in 4 to 6 weeks.  DVT prophylaxis: [id number]  Code Status: full    Code Status Orders  (From admission, onward)         Start     Ordered   08/29/19 0453  Full code  Continuous     08/29/19 0455        Code Status History    Date Active Date Inactive Code Status Order ID Comments User Context   03/09/2017 1835 03/11/2017 2355 Full Code [id number]  Mercy Riding, MD Inpatient   03/09/2017 1835 03/09/2017 1835 Full Code [id number]  Lovenia Kim, MD Inpatient   07/06/2016 1112 07/06/2016 1836 Full Code [id number]  Angelia Mould, MD Inpatient   Advance Care Planning Activity  Advance Directive Documentation     Most Recent Value  Type of Advance Directive  Living will  Pre-existing out of facility DNR order (yellow form or pink MOST form)  --  "MOST" Form  in Place?  --     Family Communication: called brother, NA LM Disposition Plan:   Patient remained inpatient for continued IV antiemetics, and postoperative recovery.  Patient not stable, not tolerating diet well, unsafe for discharge Consults called: None Admission status: Inpatient   Consultants:   Vascular surgery general surgery, cardiology  Procedures:  Ct Abdomen Pelvis Wo Contrast  Result Date: 08/29/2019 CLINICAL DATA:  Abdominal pain, acute EXAM: CT ABDOMEN AND PELVIS WITHOUT CONTRAST TECHNIQUE: Multidetector CT imaging of the abdomen and pelvis was performed following the standard protocol without IV contrast. COMPARISON:  August 03, 2016 FINDINGS: Lower chest: The visualized heart size within normal limits. No pericardial fluid/thickening. Again noted is a large hiatal hernia with almost the entirety of the stomach rotated and flipped upward is, consistent with a gastric volvulus. The portion of the herniated stomach is markedly dilated with air and food stuff which suggest gastric outlet obstruction. Hepatobiliary: Although limited due to the lack of intravenous contrast, normal in appearance without gross focal abnormality. The patient is status post cholecystectomy. No biliary ductal dilation. Pancreas:  Unremarkable.  No surrounding inflammatory changes. Spleen: Normal in size. Although limited due to the lack of intravenous contrast, normal in appearance. Adrenals/Urinary Tract: Both adrenal glands appear normal. No renal or collecting system calculi. There is left renal atrophy seen. The bladder is unremarkable. Stomach/Bowel: The small bowel and colon are normal in appearance. Scattered colonic diverticula are noted without diverticulitis. Vascular/Lymphatic: There are no enlarged abdominal or pelvic lymph nodes. Aortic Atherosclerosis (ICD10-I70.0). Reproductive: Calcified uterine fibroids are present. Other: No evidence of abdominal wall mass or hernia. Musculoskeletal: No acute  or significant osseous findings. IMPRESSION: Large hiatal hernia with gastric volvulus as on prior exam, however now there is a markedly dilated debris-filled stomach which is suggestive of gastric outlet obstruction. Electronically Signed   By: Prudencio Pair M.D.   On: 08/29/2019 03:13   Dg Abd 1 View  Result Date: 08/31/2019 CLINICAL DATA:  NG tube placement. EXAM: ABDOMEN - 1 VIEW COMPARISON:  08/21/2019 FINDINGS: The nasogastric tube is been retracted slightly since the prior exam. It is coiled within the known intrathoracic stomach. Cardiac leads and tubing over the chest. Cardiomediastinal contours are unchanged. IMPRESSION: The nasogastric tube is been retracted slightly since the prior exam. It is coiled within the known intrathoracic stomach. No other interval changes on this limited fluoroscopic assessment. Electronically Signed   By: Zetta Bills M.D.   On: 08/31/2019 17:30   Dg Abd 1 View  Result Date: 08/29/2019 CLINICAL DATA:  NG tube placement. EXAM: ABDOMEN - 1 VIEW; DG NASO G TUBE PLC W/FL-NO RAD COMPARISON:  CT 08/29/2019. FINDINGS: NG tube noted with tip coiled in the large hiatal hernia. Cardiac monitor noted chest. Cardiac leads and tubing noted over the chest. IMPRESSION: NG tube noted with its tip coiled in the patient's known large hiatal hernia. Electronically Signed   By: Marcello Moores  Register   On: 08/29/2019 12:59   Dg Chest Portable 1 View  Result Date: 08/29/2019 CLINICAL DATA:  Chest pain and shortness of breath EXAM: PORTABLE CHEST 1 VIEW COMPARISON:  08/03/2016 FINDINGS: Cardiac shadow is stable. Aortic calcifications are seen. Loop recorder is noted on the left. Large hiatal hernia is again seen but increased when compared  with the prior study. No focal infiltrate or sizable effusion is seen. No bony abnormality is noted. IMPRESSION: Changes consistent with large hiatal hernia which is increased in size from the prior study. No new focal abnormality is noted.  Electronically Signed   By: Inez Catalina M.D.   On: 08/29/2019 00:30   Dg [place]oy[place] Dys Tube Plc W/fl-no Rad  Result Date: 09/18/2019 Fluoroscopy was utilized by the requesting physician.  No radiographic interpretation.   Dg Naso G Tube Plc W/fl-no Rad  Result Date: 08/29/2019 CLINICAL DATA:  NG tube placement. EXAM: ABDOMEN - 1 VIEW; DG NASO G TUBE PLC W/FL-NO RAD COMPARISON:  CT 08/29/2019. FINDINGS: NG tube noted with tip coiled in the large hiatal hernia. Cardiac monitor noted chest. Cardiac leads and tubing noted over the chest. IMPRESSION: NG tube noted with its tip coiled in the patient's known large hiatal hernia. Electronically Signed   By: Marcello Moores  Register   On: 08/29/2019 12:59   Vas [place] Groin Pseudoaneurysm  Result Date: 09/18/2019  ARTERIAL PSEUDOANEURYSM  Exam: Right groin Indications: Patient complains of groin pain. Comparison Study: No prior study on file Performing Technologist: Sharion Dove RVS  Examination Guidelines: A complete evaluation includes B-mode imaging, spectral Doppler, color Doppler, and power Doppler as needed of all accessible portions of each vessel. Bilateral testing is considered an integral part of a complete examination. Limited examinations for reoccurring indications may be performed as noted.  Summary: No pseudoaneurysm noted in the right groin. There is hematoma noted in the groin and in the proximal thigh. Diagnosing physician: Ruta Hinds MD Electronically signed by Ruta Hinds MD on 09/18/2019 at 3:44:35 PM.   --------------------------------------------------------------------------------    Final    Vas [place] Abi With/wo Tbi  Result Date: 09/07/2019 LOWER EXTREMITY DOPPLER STUDY Indications: Discoloration and pain. High Risk Factors: Hypertension, hyperlipidemia, prior CVA.  Comparison Study: 11/04/17 previous Performing Technologist: Abram Sander RVS  Examination Guidelines: A complete evaluation includes at minimum, Doppler waveform signals and  systolic blood pressure reading at the level of bilateral brachial, anterior tibial, and posterior tibial arteries, when vessel segments are accessible. Bilateral testing is considered an integral part of a complete examination. Photoelectric Plethysmograph (PPG) waveforms and toe systolic pressure readings are included as required and additional duplex testing as needed. Limited examinations for reoccurring indications may be performed as noted.  ABI Findings: +--------+------------------+-----+---------+--------+  Right    Rt Pressure (mmHg) Index Waveform  Comment   +--------+------------------+-----+---------+--------+  Brachial 137                      triphasic           +--------+------------------+-----+---------+--------+  PTA      78                 0.57  biphasic            +--------+------------------+-----+---------+--------+  DP       82                 0.60  biphasic            +--------+------------------+-----+---------+--------+ +--------+------------------+-----+--------+--------------------+  Left     Lt Pressure (mmHg) Index Waveform Comment               +--------+------------------+-----+--------+--------------------+  Brachial                          biphasic restricted extremity  +--------+------------------+-----+--------+--------------------+  PTA  70                 0.51  biphasic                       +--------+------------------+-----+--------+--------------------+  DP       61                 0.45  biphasic                       +--------+------------------+-----+--------+--------------------+ +-------+-----------+-----------+------------+------------+  ABI/TBI Today's ABI Today's TBI Previous ABI Previous TBI  +-------+-----------+-----------+------------+------------+  Right   0.60                                               +-------+-----------+-----------+------------+------------+  Left    0.51                                                +-------+-----------+-----------+------------+------------+  Summary: Right: Resting right ankle-brachial index indicates moderate right lower extremity arterial disease. Left: Resting left ankle-brachial index indicates moderate left lower extremity arterial disease.  *See table(s) above for measurements and observations.  Electronically signed by Monica Martinez MD on 09/07/2019 at 7:15:20 PM.   Final     Antimicrobials:   None   Subjective: She is tolerating a diet well. Complains of intermittent sore in her right thigh. She reports feeling weak with worse weakness of the right leg but continues to be able to ambulate.   Objective: Vitals:   09/22/19 2037 09/22/19 2227 09/23/19 0448 09/23/19 1414  BP: 127/68  115/67   Pulse: (!) 114 (!) 113 98   Resp: (!) 33 19 (!) 23   Temp: 98.6 F (37 C)  97.6 F (36.4 C) 97.9 F (36.6 C)  TempSrc: Oral  Oral Oral  SpO2: 95% 98% 100%   Weight:   76.9 kg   Height:        Intake/Output Summary (Last 24 hours) at 09/23/2019 1645 Last data filed at 09/23/2019 1117 Gross per 24 hour  Intake 780 ml  Output 150 ml  Net 630 ml   Filed Weights   09/21/19 0405 09/22/19 0621 09/23/19 0448  Weight: 75.8 kg 76.9 kg 76.9 kg    Examination:  General exam:Appears calm and comfortable  Respiratory system: Clear to auscultation. Respiratory effort normal. Cardiovascular system:Irregularly irregular mildl tachycardia.  gastrointestinal system:Abdomen is nondistended, soft and nontender.  Central nervous system:Alert No focal neurological deficits. Extremities:Warm well perfused no contractures. Mild discoloration of the groin area to upper thigh with no obvious bruising or ecchymosis, mild TTP but no erythema, fluctuance, or mass.  Skin: No rashes, lesions or ulcers noted Psychiatry: Mood &affect appropriate.     Data Reviewed: I have personally reviewed following labs and imaging studies  CBC: Recent Labs  Lab 09/17/19 0354  WBC  8.5  NEUTROABS 5.6  HGB 9.4*  HCT 29.1*  MCV 91.8  PLT [id number]   Basic Metabolic Panel: Recent Labs  Lab 09/17/19 0354 09/18/19 0352 09/19/19 0426 09/20/19 1130  NA 136 136 136 136  K 4.3 4.2 4.6 4.5  CL 100 100 101 98  CO2 22 22 25 27   GLUCOSE 106*  106* 123* 122*  BUN 20 28* 28* 22  CREATININE 1.20* 1.21* 1.22* 1.01*  CALCIUM 8.4* 8.2* 8.4* 8.8*  MG 2.0  --   --   --   PHOS 4.2  --   --   --    GFR: Estimated Creatinine Clearance: 41.4 mL/min (A) (by C-G formula based on SCr of 1.01 mg/dL (H)). Liver Function Tests: No results for input(s): AST, ALT, ALKPHOS, BILITOT, PROT, ALBUMIN in the last 168 hours. No results for input(s): LIPASE, AMYLASE in the last 168 hours. No results for input(s): AMMONIA in the last 168 hours. Coagulation Profile: No results for input(s): INR, PROTIME in the last 168 hours. Cardiac Enzymes: No results for input(s): CKTOTAL, CKMB, CKMBINDEX, TROPONINI in the last 168 hours. BNP (last 3 results) No results for input(s): PROBNP in the last 8760 hours. HbA1C: No results for input(s): HGBA1C in the last 72 hours. CBG: Recent Labs  Lab 09/21/19 2343 09/22/19 0803 09/22/19 1551 09/23/19 0050 09/23/19 0756  GLUCAP 108* 111* 113* 114* 96   Lipid Profile: No results for input(s): CHOL, HDL, LDLCALC, TRIG, CHOLHDL, LDLDIRECT in the last 72 hours. Thyroid Function Tests: No results for input(s): TSH, T4TOTAL, FREET4, T3FREE, THYROIDAB in the last 72 hours. Anemia Panel: No results for input(s): VITAMINB12, FOLATE, FERRITIN, TIBC, IRON, RETICCTPCT in the last 72 hours. Sepsis Labs: No results for input(s): PROCALCITON, LATICACIDVEN in the last 168 hours.  Recent Results (from the past 240 hour(s))  SARS CORONAVIRUS 2 (TAT 6-24 HRS) Nasopharyngeal Nasopharyngeal Swab     Status: None   Collection Time: 09/18/19 10:08 AM   Specimen: Nasopharyngeal Swab  Result Value Ref Range Status   SARS Coronavirus 2 NEGATIVE NEGATIVE Final    Comment:  (NOTE) SARS-CoV-2 target nucleic acids are NOT DETECTED. The SARS-CoV-2 RNA is generally detectable in upper and lower respiratory specimens during the acute phase of infection. Negative results do not preclude SARS-CoV-2 infection, do not rule out co-infections with other pathogens, and should not be used as the sole basis for treatment or other patient management decisions. Negative results must be combined with clinical observations, patient history, and epidemiological information. The expected result is Negative. Fact Sheet for Patients: SugarRoll.be Fact Sheet for Healthcare Providers: https://www.woods-mathews.com/ This test is not yet approved or cleared by the [place] FDA and  has been authorized for detection and/or diagnosis of SARS-CoV-2 by FDA under an Emergency Use Authorization (EUA). This EUA will remain  in effect (meaning this test can be used) for the duration of the COVID-19 declaration under Section 56 4(b)(1) of the Act, 21 U.S.C. section 360bbb-3(b)(1), unless the authorization is terminated or revoked sooner. Performed at [facility], [place] [address] [place], [place] [address]   SARS CORONAVIRUS 2 (TAT 6-24 HRS) Nasopharyngeal Nasopharyngeal Swab     Status: None   Collection Time: 09/21/19  2:30 PM   Specimen: Nasopharyngeal Swab  Result Value Ref Range Status   SARS Coronavirus 2 NEGATIVE NEGATIVE Final    Comment: (NOTE) SARS-CoV-2 target nucleic acids are NOT DETECTED. The SARS-CoV-2 RNA is generally detectable in upper and lower respiratory specimens during the acute phase of infection. Negative results do not preclude SARS-CoV-2 infection, do not rule out co-infections with other pathogens, and should not be used as the sole basis for treatment or other patient management decisions. Negative results must be combined with clinical observations, patient history, and epidemiological information. The  expected result is Negative. Fact Sheet for Patients: SugarRoll.be Fact Sheet  for Healthcare Providers: https://www.woods-mathews.com/ This test is not yet approved or cleared by the [place] and  has been authorized for detection and/or diagnosis of SARS-CoV-2 by FDA under an Emergency Use Authorization (EUA). This EUA will remain  in effect (meaning this test can be used) for the duration of the COVID-19 declaration under Section 56 4(b)(1) of the Act, 21 U.S.C. section 360bbb-3(b)(1), unless the authorization is terminated or revoked sooner. Performed at [facility], [place] [address] [place], [place] [address]          Radiology Studies: No results found.      Scheduled Meds:  amiodarone  200 mg Oral Daily   apixaban  5 mg Oral BID   chlorhexidine  15 mL Mouth Rinse BID   clopidogrel  75 mg Oral Q breakfast   cyclobenzaprine  5 mg Oral TID   docusate sodium  100 mg Oral Daily   feeding supplement (ENSURE ENLIVE)  237 mL Oral BID BM   latanoprost  1 drop Both Eyes QHS   levothyroxine  100 mcg Oral Q0600   lidocaine  1 patch Transdermal Q24H   mouth rinse  15 mL Mouth Rinse q12n4p   pantoprazole  40 mg Oral Daily   polyethylene glycol  17 g Oral BID   sodium chloride flush  3 mL Intravenous Q12H   Continuous Infusions:  sodium chloride     sodium chloride Stopped (09/22/19 0855)     LOS: 25 days    Time spent: [place], MD [facility]  If 7PM-7AM, please contact night-coverage  09/23/2019, 4:45 PM

## 2019-09-24 LAB — CBC
HCT: 23.1 % — ABNORMAL LOW (ref 36.0–46.0)
Hemoglobin: 7.3 g/dL — ABNORMAL LOW (ref 12.0–15.0)
MCH: 28.9 pg (ref 26.0–34.0)
MCHC: 31.6 g/dL (ref 30.0–36.0)
MCV: 91.3 fL (ref 80.0–100.0)
Platelets: 335 10*3/uL (ref 150–400)
RBC: 2.53 MIL/uL — ABNORMAL LOW (ref 3.87–5.11)
RDW: 13.6 % (ref 11.5–15.5)
WBC: 7.7 10*3/uL (ref 4.0–10.5)
nRBC: 0 % (ref 0.0–0.2)

## 2019-09-24 LAB — BASIC METABOLIC PANEL
Anion gap: 10 (ref 5–15)
BUN: 18 mg/dL (ref 8–23)
CO2: 28 mmol/L (ref 22–32)
Calcium: 8.4 mg/dL — ABNORMAL LOW (ref 8.9–10.3)
Chloride: 97 mmol/L — ABNORMAL LOW (ref 98–111)
Creatinine, Ser: 1.11 mg/dL — ABNORMAL HIGH (ref 0.44–1.00)
GFR calc Af Amer: 53 mL/min — ABNORMAL LOW (ref >60)
GFR calc non Af Amer: 46 mL/min — ABNORMAL LOW (ref >60)
Glucose, Bld: 107 mg/dL — ABNORMAL HIGH (ref 70–99)
Potassium: 4.2 mmol/L (ref 3.5–5.1)
Sodium: 135 mmol/L (ref 135–145)

## 2019-09-24 LAB — GLUCOSE, CAPILLARY
Glucose-Capillary: 108 mg/dL — ABNORMAL HIGH (ref 70–99)
Glucose-Capillary: 115 mg/dL — ABNORMAL HIGH (ref 70–99)
Glucose-Capillary: 119 mg/dL — ABNORMAL HIGH (ref 70–99)

## 2019-09-24 NOTE — Progress Notes (Signed)
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 CT scan of the abdomen showed large hiatal hernia with gastric volvulus and gastric outlet obstruction.Withmultiple unsuccessful attempts to pass NG tube at bedside as well under fluoroscopy. General surgery was consulted and he underwent last for scopic gastropexy on 09/01/2019 with placement of a 20 French gastrostomy tube and extensive adhesion lysis. During this hospitalization he underwent left lower extremity arteriogram and stenting.   Assessment & Plan:   Principal Problem:   Volvulus of stomach Active Problems:   PAD (peripheral artery disease) (HCC)   Essential hypertension   PAF (paroxysmal atrial fibrillation) (HCC)   Gastric outlet obstruction   Large hiatal hernia with gastric outlet obstruction: No changes,CT scan on admission show a large hiatal hernia with gastric outlet obstruction and volvulus. Underwent laparoscopic gastric pexy on 09/01/2019 with placement of a 20 French gastrostomy tube and lysis of adhesions. Very slow to recover Significant improvement in nausea and vomiting Tolerating diet Per general surgery will require EGD in 6 weeks.   Acute ischemic left fourth and fifth toe/peripheral vascular disease: His left fourth and fifth toe became cyanotic and painful he is known to have an SFA occlusion with three-vessel runoff in 2017. ABI showed moderate right lower extremity arterial disease. Vascular surgery was consulted he is status post arteriogram on 09/11/2019 with a stent to the left SFA and balloon angioplasty  of the trunk. Vascular recommended to come continue Eliquis.  She is also on Plavix.  Patient developed a small right groin hematoma at the catheter site which causes her pain at times. Encouraged her to continue PT and to massage the area TID to promote healing.  Acute blood loss anemia/groin hematoma post arteriogram/anemia of chronic disease: With a hemoglobin9.4 11/29 Status post 2 unit of packed red blood cells on 09/13/2019. Currently on Eliquis. Hgb has trended down to 7.3 (from 9.4 on 11/29) but with no active bleeding.  She feels weak but denies dyspnea, lightheadedness. Will repeat CBC in am, may need transfusion if Hgb remains close to 7 and she is symptomatic.   Right hip pain: Suspect bursitis: Patient currently using lidocaine patch. Continue to titrate down pain medications.  Hypokalemia: Now resolved. 4.5  Paroxysmal atrial fibrillation: Continue amiodarone and Eliquis. Cardiology was following  Essential hypertension: Blood pressure fairly controlled off antihypertensive medication continue to monitor.  Chronic kidney disease stage III: With a baseline creatinine of 1.3-1.5, patient was started on IV fluids. She is currently taking regular oral intake, no longer on IV fluids.    Hypothyroidism: Continue Synthroid. Will need repeat thyroid studies in 4 to 6 weeks.  Decreased caloric intake.  Patient reports not liking the food as much.  Will place dietitian consultation.   DVT prophylaxis: [id number]  Code Status: full    Code Status Orders  (From admission, onward)         Start     Ordered   08/29/19 0453  Full code  Continuous     08/29/19 0455        Code Status History    Date Active Date Inactive Code Status Order ID  Comments User Context   03/09/2017 1835 03/11/2017 2355 Full Code [id number]  Mercy Riding, MD Inpatient   03/09/2017 1835 03/09/2017 1835 Full Code [id number]  Lovenia Kim, MD Inpatient   07/06/2016 1112 07/06/2016  1836 Full Code [id number]  Angelia Mould, MD Inpatient   Advance Care Planning Activity    Advance Directive Documentation     Most Recent Value  Type of Advance Directive  Living will  Pre-existing out of facility DNR order (yellow form or pink MOST form)  --  "MOST" Form in Place?  --     Family Communication: called brother, NA LM Disposition Plan:   Patient remained inpatient for continued IV antiemetics, and postoperative recovery.  Patient not stable, not tolerating diet well, unsafe for discharge Consults called: None Admission status: Inpatient   Consultants:   Vascular surgery general surgery, cardiology  Procedures:  Ct Abdomen Pelvis Wo Contrast  Result Date: 08/29/2019 CLINICAL DATA:  Abdominal pain, acute EXAM: CT ABDOMEN AND PELVIS WITHOUT CONTRAST TECHNIQUE: Multidetector CT imaging of the abdomen and pelvis was performed following the standard protocol without IV contrast. COMPARISON:  August 03, 2016 FINDINGS: Lower chest: The visualized heart size within normal limits. No pericardial fluid/thickening. Again noted is a large hiatal hernia with almost the entirety of the stomach rotated and flipped upward is, consistent with a gastric volvulus. The portion of the herniated stomach is markedly dilated with air and food stuff which suggest gastric outlet obstruction. Hepatobiliary: Although limited due to the lack of intravenous contrast, normal in appearance without gross focal abnormality. The patient is status post cholecystectomy. No biliary ductal dilation. Pancreas:  Unremarkable.  No surrounding inflammatory changes. Spleen: Normal in size. Although limited due to the lack of intravenous contrast, normal in appearance. Adrenals/Urinary Tract: Both adrenal glands appear normal. No renal or collecting system calculi. There is left renal atrophy seen. The bladder is unremarkable. Stomach/Bowel: The small bowel and colon are normal in appearance. Scattered colonic  diverticula are noted without diverticulitis. Vascular/Lymphatic: There are no enlarged abdominal or pelvic lymph nodes. Aortic Atherosclerosis (ICD10-I70.0). Reproductive: Calcified uterine fibroids are present. Other: No evidence of abdominal wall mass or hernia. Musculoskeletal: No acute or significant osseous findings. IMPRESSION: Large hiatal hernia with gastric volvulus as on prior exam, however now there is a markedly dilated debris-filled stomach which is suggestive of gastric outlet obstruction. Electronically Signed   By: Prudencio Pair M.D.   On: 08/29/2019 03:13   Dg Abd 1 View  Result Date: 08/31/2019 CLINICAL DATA:  NG tube placement. EXAM: ABDOMEN - 1 VIEW COMPARISON:  08/21/2019 FINDINGS: The nasogastric tube is been retracted slightly since the prior exam. It is coiled within the known intrathoracic stomach. Cardiac leads and tubing over the chest. Cardiomediastinal contours are unchanged. IMPRESSION: The nasogastric tube is been retracted slightly since the prior exam. It is coiled within the known intrathoracic stomach. No other interval changes on this limited fluoroscopic assessment. Electronically Signed   By: Zetta Bills M.D.   On: 08/31/2019 17:30   Dg Abd 1 View  Result Date: 08/29/2019 CLINICAL DATA:  NG tube placement. EXAM: ABDOMEN - 1 VIEW; DG NASO G TUBE PLC W/FL-NO RAD COMPARISON:  CT 08/29/2019. FINDINGS: NG tube noted with tip coiled in the large hiatal hernia. Cardiac monitor noted chest. Cardiac leads and tubing noted over the chest. IMPRESSION: NG tube noted with its tip coiled in the patient's known large hiatal hernia. Electronically Signed   By: Marcello Moores  Register   On:  08/29/2019 12:59   Dg Chest Portable 1 View  Result Date: 08/29/2019 CLINICAL DATA:  Chest pain and shortness of breath EXAM: PORTABLE CHEST 1 VIEW COMPARISON:  08/03/2016 FINDINGS: Cardiac shadow is stable. Aortic calcifications are seen. Loop recorder is noted on the left. Large hiatal hernia is  again seen but increased when compared with the prior study. No focal infiltrate or sizable effusion is seen. No bony abnormality is noted. IMPRESSION: Changes consistent with large hiatal hernia which is increased in size from the prior study. No new focal abnormality is noted. Electronically Signed   By: Inez Catalina M.D.   On: 08/29/2019 00:30   Dg [place] Dys Tube Plc W/fl-no Rad  Result Date: 09/18/2019 Fluoroscopy was utilized by the requesting physician.  No radiographic interpretation.   Dg Naso G Tube Plc W/fl-no Rad  Result Date: 08/29/2019 CLINICAL DATA:  NG tube placement. EXAM: ABDOMEN - 1 VIEW; DG NASO G TUBE PLC W/FL-NO RAD COMPARISON:  CT 08/29/2019. FINDINGS: NG tube noted with tip coiled in the large hiatal hernia. Cardiac monitor noted chest. Cardiac leads and tubing noted over the chest. IMPRESSION: NG tube noted with its tip coiled in the patient's known large hiatal hernia. Electronically Signed   By: Marcello Moores  Register   On: 08/29/2019 12:59   Vas [place] Groin Pseudoaneurysm  Result Date: 09/18/2019  ARTERIAL PSEUDOANEURYSM  Exam: Right groin Indications: Patient complains of groin pain. Comparison Study: No prior study on file Performing Technologist: Sharion Dove RVS  Examination Guidelines: A complete evaluation includes B-mode imaging, spectral Doppler, color Doppler, and power Doppler as needed of all accessible portions of each vessel. Bilateral testing is considered an integral part of a complete examination. Limited examinations for reoccurring indications may be performed as noted.  Summary: No pseudoaneurysm noted in the right groin. There is hematoma noted in the groin and in the proximal thigh. Diagnosing physician: Ruta Hinds MD Electronically signed by Ruta Hinds MD on 09/18/2019 at 3:44:35 PM.   --------------------------------------------------------------------------------    Final    Vas [place] Abi With/wo Tbi  Result Date: 09/07/2019 LOWER EXTREMITY DOPPLER  STUDY Indications: Discoloration and pain. High Risk Factors: Hypertension, hyperlipidemia, prior CVA.  Comparison Study: 11/04/17 previous Performing Technologist: Abram Sander RVS  Examination Guidelines: A complete evaluation includes at minimum, Doppler waveform signals and systolic blood pressure reading at the level of bilateral brachial, anterior tibial, and posterior tibial arteries, when vessel segments are accessible. Bilateral testing is considered an integral part of a complete examination. Photoelectric Plethysmograph (PPG) waveforms and toe systolic pressure readings are included as required and additional duplex testing as needed. Limited examinations for reoccurring indications may be performed as noted.  ABI Findings: +--------+------------------+-----+---------+--------+  Right    Rt Pressure (mmHg) Index Waveform  Comment   +--------+------------------+-----+---------+--------+  Brachial 137                      triphasic           +--------+------------------+-----+---------+--------+  PTA      78                 0.57  biphasic            +--------+------------------+-----+---------+--------+  DP       82                 0.60  biphasic            +--------+------------------+-----+---------+--------+ +--------+------------------+-----+--------+--------------------+  Left     Lt  Pressure (mmHg) Index Waveform Comment               +--------+------------------+-----+--------+--------------------+  Brachial                          biphasic restricted extremity  +--------+------------------+-----+--------+--------------------+  PTA      70                 0.51  biphasic                       +--------+------------------+-----+--------+--------------------+  DP       61                 0.45  biphasic                       +--------+------------------+-----+--------+--------------------+ +-------+-----------+-----------+------------+------------+  ABI/TBI Today's ABI Today's TBI Previous ABI Previous  TBI  +-------+-----------+-----------+------------+------------+  Right   0.60                                               +-------+-----------+-----------+------------+------------+  Left    0.51                                               +-------+-----------+-----------+------------+------------+  Summary: Right: Resting right ankle-brachial index indicates moderate right lower extremity arterial disease. Left: Resting left ankle-brachial index indicates moderate left lower extremity arterial disease.  *See table(s) above for measurements and observations.  Electronically signed by Monica Martinez MD on 09/07/2019 at 7:15:20 PM.   Final     Antimicrobials:   None   Subjective: She reports not liking the food and has had decreased intake. She is willing to try supplementation.   Objective: Vitals:   09/23/19 1414 09/23/19 1948 09/24/19 0059 09/24/19 0440  BP:  128/74  110/65  Pulse:  (!) 104 97 98  Resp:  (!) 34 (!) 24 (!) 24  Temp: 97.9 F (36.6 C) 98.5 F (36.9 C)  98.2 F (36.8 C)  TempSrc: Oral Oral  Oral  SpO2:  95% 100% 100%  Weight:    76.5 kg  Height:        Intake/Output Summary (Last 24 hours) at 09/24/2019 1309 Last data filed at 09/23/2019 1949 Gross per 24 hour  Intake 360 ml  Output 250 ml  Net 110 ml   Filed Weights   09/22/19 0621 09/23/19 0448 09/24/19 0440  Weight: 76.9 kg 76.9 kg 76.5 kg    Examination:  General exam:Appears calm and comfortable  Respiratory system: Clear to auscultation. Respiratory effort normal. Cardiovascular system:Irregularly irregular mildl tachycardia.  gastrointestinal system:Abdomen is nondistended, soft and nontender.  Central nervous system:Alert No focal neurological deficits. Extremities:Warm well perfused no contractures. Mild discoloration of the groin area to upper thigh with no obvious bruising or ecchymosis, mild TTP but no erythema, fluctuance, or mass.  Skin: No rashes, lesions or ulcers  noted Psychiatry: Mood &affect appropriate.     Data Reviewed: I have personally reviewed following labs and imaging studies  CBC: Recent Labs  Lab 09/24/19 0432  WBC 7.7  HGB 7.3*  HCT 23.1*  MCV 91.3  PLT 335   Basic  Metabolic Panel: Recent Labs  Lab 09/18/19 0352 09/19/19 0426 09/20/19 1130 09/24/19 0432  NA 136 136 136 135  K 4.2 4.6 4.5 4.2  CL 100 101 98 97*  CO2 22 25 27 28   GLUCOSE 106* 123* 122* 107*  BUN 28* 28* 22 18  CREATININE 1.21* 1.22* 1.01* 1.11*  CALCIUM 8.2* 8.4* 8.8* 8.4*   GFR: Estimated Creatinine Clearance: 37.6 mL/min (A) (by C-G formula based on SCr of 1.11 mg/dL (H)). Liver Function Tests: No results for input(s): AST, ALT, ALKPHOS, BILITOT, PROT, ALBUMIN in the last 168 hours. No results for input(s): LIPASE, AMYLASE in the last 168 hours. No results for input(s): AMMONIA in the last 168 hours. Coagulation Profile: No results for input(s): INR, PROTIME in the last 168 hours. Cardiac Enzymes: No results for input(s): CKTOTAL, CKMB, CKMBINDEX, TROPONINI in the last 168 hours. BNP (last 3 results) No results for input(s): PROBNP in the last 8760 hours. HbA1C: No results for input(s): HGBA1C in the last 72 hours. CBG: Recent Labs  Lab 09/23/19 0050 09/23/19 0756 09/23/19 1721 09/24/19 0003 09/24/19 0803  GLUCAP 114* 96 102* 115* 108*   Lipid Profile: No results for input(s): CHOL, HDL, LDLCALC, TRIG, CHOLHDL, LDLDIRECT in the last 72 hours. Thyroid Function Tests: No results for input(s): TSH, T4TOTAL, FREET4, T3FREE, THYROIDAB in the last 72 hours. Anemia Panel: No results for input(s): VITAMINB12, FOLATE, FERRITIN, TIBC, IRON, RETICCTPCT in the last 72 hours. Sepsis Labs: No results for input(s): PROCALCITON, LATICACIDVEN in the last 168 hours.  Recent Results (from the past 240 hour(s))  SARS CORONAVIRUS 2 (TAT 6-24 HRS) Nasopharyngeal Nasopharyngeal Swab     Status: None   Collection Time: 09/18/19 10:08 AM   Specimen:  Nasopharyngeal Swab  Result Value Ref Range Status   SARS Coronavirus 2 NEGATIVE NEGATIVE Final    Comment: (NOTE) SARS-CoV-2 target nucleic acids are NOT DETECTED. The SARS-CoV-2 RNA is generally detectable in upper and lower respiratory specimens during the acute phase of infection. Negative results do not preclude SARS-CoV-2 infection, do not rule out co-infections with other pathogens, and should not be used as the sole basis for treatment or other patient management decisions. Negative results must be combined with clinical observations, patient history, and epidemiological information. The expected result is Negative. Fact Sheet for Patients: SugarRoll.be Fact Sheet for Healthcare Providers: https://www.woods-mathews.com/ This test is not yet approved or cleared by the [place] FDA and  has been authorized for detection and/or diagnosis of SARS-CoV-2 by FDA under an Emergency Use Authorization (EUA). This EUA will remain  in effect (meaning this test can be used) for the duration of the COVID-19 declaration under Section 56 4(b)(1) of the Act, 21 U.S.C. section 360bbb-3(b)(1), unless the authorization is terminated or revoked sooner. Performed at [facility], [place] [address] [place], [place] [address]   SARS CORONAVIRUS 2 (TAT 6-24 HRS) Nasopharyngeal Nasopharyngeal Swab     Status: None   Collection Time: 09/21/19  2:30 PM   Specimen: Nasopharyngeal Swab  Result Value Ref Range Status   SARS Coronavirus 2 NEGATIVE NEGATIVE Final    Comment: (NOTE) SARS-CoV-2 target nucleic acids are NOT DETECTED. The SARS-CoV-2 RNA is generally detectable in upper and lower respiratory specimens during the acute phase of infection. Negative results do not preclude SARS-CoV-2 infection, do not rule out co-infections with other pathogens, and should not be used as the sole basis for treatment or other patient management  decisions. Negative results must be combined with clinical observations,  patient history, and epidemiological information. The expected result is Negative. Fact Sheet for Patients: SugarRoll.be Fact Sheet for Healthcare Providers: https://www.woods-mathews.com/ This test is not yet approved or cleared by the [place] FDA and  has been authorized for detection and/or diagnosis of SARS-CoV-2 by FDA under an Emergency Use Authorization (EUA). This EUA will remain  in effect (meaning this test can be used) for the duration of the COVID-19 declaration under Section 56 4(b)(1) of the Act, 21 U.S.C. section 360bbb-3(b)(1), unless the authorization is terminated or revoked sooner. Performed at [facility], [place] [address], [place], [place] [address]          Radiology Studies: No results found.      Scheduled Meds:  amiodarone  200 mg Oral Daily   apixaban  5 mg Oral BID   chlorhexidine  15 mL Mouth Rinse BID   clopidogrel  75 mg Oral Q breakfast   cyclobenzaprine  5 mg Oral TID   docusate sodium  100 mg Oral Daily   feeding supplement (ENSURE ENLIVE)  237 mL Oral BID BM   latanoprost  1 drop Both Eyes QHS   levothyroxine  100 mcg Oral Q0600   lidocaine  1 patch Transdermal Q24H   mouth rinse  15 mL Mouth Rinse q12n4p   pantoprazole  40 mg Oral Daily   polyethylene glycol  17 g Oral BID   sodium chloride flush  3 mL Intravenous Q12H   Continuous Infusions:  sodium chloride     sodium chloride Stopped (09/22/19 0855)     LOS: 26 days    Time spent: 25 MIN    Blain Pais, MD [facility]  If 7PM-7AM, please contact night-coverage  09/24/2019, 1:09 PM

## 2019-09-25 DIAGNOSIS — K449 Diaphragmatic hernia without obstruction or gangrene: Secondary | ICD-10-CM

## 2019-09-25 LAB — COMPREHENSIVE METABOLIC PANEL
ALT: 14 U/L (ref 0–44)
AST: 13 U/L — ABNORMAL LOW (ref 15–41)
Albumin: 2.1 g/dL — ABNORMAL LOW (ref 3.5–5.0)
Alkaline Phosphatase: 80 U/L (ref 38–126)
Anion gap: 10 (ref 5–15)
BUN: 16 mg/dL (ref 8–23)
CO2: 28 mmol/L (ref 22–32)
Calcium: 8.6 mg/dL — ABNORMAL LOW (ref 8.9–10.3)
Chloride: 99 mmol/L (ref 98–111)
Creatinine, Ser: 1.2 mg/dL — ABNORMAL HIGH (ref 0.44–1.00)
GFR calc Af Amer: 48 mL/min — ABNORMAL LOW (ref >60)
GFR calc non Af Amer: 42 mL/min — ABNORMAL LOW (ref >60)
Glucose, Bld: 113 mg/dL — ABNORMAL HIGH (ref 70–99)
Potassium: 4.1 mmol/L (ref 3.5–5.1)
Sodium: 137 mmol/L (ref 135–145)
Total Bilirubin: 0.3 mg/dL (ref 0.3–1.2)
Total Protein: 5.3 g/dL — ABNORMAL LOW (ref 6.5–8.1)

## 2019-09-25 LAB — GLUCOSE, CAPILLARY
Glucose-Capillary: 100 mg/dL — ABNORMAL HIGH (ref 70–99)
Glucose-Capillary: 109 mg/dL — ABNORMAL HIGH (ref 70–99)
Glucose-Capillary: 128 mg/dL — ABNORMAL HIGH (ref 70–99)

## 2019-09-25 LAB — CBC
HCT: 24.8 % — ABNORMAL LOW (ref 36.0–46.0)
Hemoglobin: 7.7 g/dL — ABNORMAL LOW (ref 12.0–15.0)
MCH: 28.5 pg (ref 26.0–34.0)
MCHC: 31 g/dL (ref 30.0–36.0)
MCV: 91.9 fL (ref 80.0–100.0)
Platelets: 359 10*3/uL (ref 150–400)
RBC: 2.7 MIL/uL — ABNORMAL LOW (ref 3.87–5.11)
RDW: 13.8 % (ref 11.5–15.5)
WBC: 7.6 10*3/uL (ref 4.0–10.5)
nRBC: 0 % (ref 0.0–0.2)

## 2019-09-25 LAB — SARS CORONAVIRUS 2 (TAT 6-24 HRS): SARS Coronavirus 2: NEGATIVE

## 2019-09-25 MED ORDER — METHOCARBAMOL 500 MG PO TABS: 500.0000 mg | ORAL_TABLET | Freq: Three times a day (TID) | ORAL | 0 refills | Status: DC | PRN

## 2019-09-25 MED ORDER — LIDOCAINE 5 % EX PTCH: 1.0000 | MEDICATED_PATCH | CUTANEOUS | 0 refills | Status: DC

## 2019-09-25 MED ORDER — BOOST / RESOURCE BREEZE PO LIQD CUSTOM
1.0000 | Freq: Two times a day (BID) | ORAL | Status: DC
  Administered 2019-09-25 – 2019-09-26 (×2): 1 via ORAL

## 2019-09-25 MED ORDER — AMIODARONE HCL 200 MG PO TABS: 200.0000 mg | ORAL_TABLET | Freq: Every day | ORAL | 0 refills | Status: DC

## 2019-09-25 MED ORDER — TRAMADOL HCL 50 MG PO TABS: 50.0000 mg | ORAL_TABLET | Freq: Four times a day (QID) | ORAL | 0 refills | Status: DC | PRN

## 2019-09-25 MED ORDER — DOCUSATE SODIUM 100 MG PO CAPS: 100.0000 mg | ORAL_CAPSULE | Freq: Every day | ORAL | 0 refills | Status: DC

## 2019-09-25 MED ORDER — CYCLOBENZAPRINE HCL 5 MG PO TABS: 5.0000 mg | ORAL_TABLET | Freq: Three times a day (TID) | ORAL | 0 refills | Status: DC

## 2019-09-25 MED ORDER — CLOPIDOGREL BISULFATE 75 MG PO TABS: 75.0000 mg | ORAL_TABLET | Freq: Every day | ORAL | 0 refills | Status: AC

## 2019-09-25 NOTE — TOC Progression Note (Signed)
Transition of Care [facility]) - Progression Note    Patient Details  Name: Alyssa Odonnell MRN: [id number] Date of Birth: Jan 22, 1934  [facility] CM/SW Contact  Eileen Stanford, LCSW Phone Number: 09/25/2019, 3:15 PM  Clinical Narrative:   Pt stable for d/c however needs updated covid in order to d/c to facility. MD notified.    Expected Discharge Plan: [place] Barriers to Discharge: No Barriers Identified  Expected Discharge Plan and Services Expected Discharge Plan: [place] In-house Referral: NA   Post Acute Care Choice: [place] Living arrangements for the past 2 months: Single Family Home                                       Social Determinants of Health (SDOH) Interventions    Readmission Risk Interventions No flowsheet data found.

## 2019-09-25 NOTE — Discharge Summary (Signed)
Physician Discharge Summary  Alyssa Odonnell [id number] DOB: [birth year] DOA: 08/28/2019  PCP: Drake Leach, MD  Admit date: 08/28/2019 Discharge date: 09/26/2019  Admitted From: Home Disposition:  SNF  Recommendations for Outpatient Follow-up:  1. Follow up with PCP in 1-2 weeks 2. Recommend repeat CBC, BMET in 1 week and TSH in 4-6 weeks, PCP to follow  [place] reviewed. 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 CT scan of the abdomen showed large hiatal hernia with gastric volvulus and gastric outlet obstruction.Withmultiple unsuccessful attempts to pass NG tube at bedside as well under fluoroscopy. General surgery was consulted and he underwent last for scopic gastropexy on 09/01/2019 with placement of a 20 French gastrostomy tube and extensive adhesion lysis. During this hospitalization he underwent left lower extremity arteriogram and stenting  Discharge Diagnoses:  Principal Problem:   Volvulus of stomach Active Problems:   PAD (peripheral artery disease) ([place])   Essential hypertension   PAF (paroxysmal atrial fibrillation) ([facility])   Gastric outlet obstruction   Large hiatal hernia with gastric outlet obstruction: No changes,CT scan on admission show a large hiatal hernia with gastric outlet obstruction and volvulus. Underwent laparoscopic gastric pexy on 09/01/2019 with placement of a 20 French gastrostomy tube and lysis of adhesions. Ultimately significant improvement in nausea and vomiting Tolerating diet Per general surgery will require EGD in 6 weeks.   Acute ischemic left fourth  and fifth toe/peripheral vascular disease: His left fourth and fifth toe became cyanotic and painful he is known to have an SFA occlusion with three-vessel runoff in 2017. ABI showed moderate right lower extremity arterial disease. Vascular surgery was consulted he is status post arteriogram on 09/11/2019 with a stent to the left SFA and balloon angioplasty of the trunk. Vascular recommended to come continue Eliquis.  She is also on Plavix.  Patient developed a small right groin hematoma at the catheter site which causes her pain at times. Encouraged her to continue PT and to massage the area TID to promote healing.  Acute blood loss anemia/groin hematoma post arteriogram/anemia of chronic disease: With a hemoglobin9.4 11/29 Status post 2 unit of packed red blood cells on 09/13/2019. Currently on Eliquis. Hgb has trended down to 7.3 (from 9.4 on 11/29) but with no active bleeding.  She feels weak but denies dyspnea, lightheadedness.  Hgb remained stable  Right hip pain: Suspect bursitis: Patient currently using lidocaine patch. Continue to titrate down pain medications.  Hypokalemia: Now resolved.  Paroxysmal atrial fibrillation: Continue amiodarone and Eliquis. Cardiology was following  Essential hypertension: Blood pressure fairly controlled off antihypertensive medication  Would continue to monitor.  Chronic kidney disease stage III: With a baseline creatinine of 1.3-1.5, patient was started on IV fluids. Sheis currently taking regular oral intake, no longer on IV fluids.    Hypothyroidism: Continue Synthroid. Will need repeat thyroid studies in 4 to 6 weeks.  Decreased caloric intake.  Patient reports not liking the food as much.  Will place dietitian consultation.    Discharge Instructions   Allergies as of 09/26/2019      Reactions   Aspirin Other (See Comments)   Red splotches   Atorvastatin Other (See Comments)   Muscle  spams   Nabumetone  Itching   Nsaids    Reaction (??)   Pravastatin Other (See Comments)   Stiff walking difficulty Stiff walking difficulty   Procaine Swelling   Rosuvastatin Nausea Only, Other (See Comments)   Reflux and Muscle stiffness   Simvastatin Nausea Only, Other (See Comments)   Reflux and Muscle stiffness   Statins Other (See Comments)   Evolocumab Rash   Mobic [meloxicam] Rash   Penicillins Rash   Has patient had a PCN reaction causing immediate rash, facial/tongue/throat swelling, SOB or lightheadedness with hypotension: Yes Has patient had a PCN reaction causing severe rash involving mucus membranes or skin necrosis: No Has patient had a PCN reaction that required hospitalization: No Has patient had a PCN reaction occurring within the last 10 years: No If all of the above answers are "NO", then may proceed with Cephalosporin use.      Medication List    STOP taking these medications   furosemide 40 MG tablet Commonly known as: LASIX   olmesartan-hydrochlorothiazide 20-12.5 MG tablet Commonly known as: BENICAR HCT   sertraline 100 MG tablet Commonly known as: ZOLOFT     TAKE these medications   acetaminophen 500 MG tablet Commonly known as: TYLENOL Take 1,000 mg by mouth daily as needed for mild pain.   amiodarone 200 MG tablet Commonly known as: PACERONE Take 1 tablet (200 mg total) by mouth daily.   clopidogrel 75 MG tablet Commonly known as: PLAVIX Take 1 tablet (75 mg total) by mouth daily with breakfast.   cyclobenzaprine 5 MG tablet Commonly known as: FLEXERIL Take 1 tablet (5 mg total) by mouth 3 (three) times daily.   docusate sodium 100 MG capsule Commonly known as: COLACE Take 1 capsule (100 mg total) by mouth daily.   Eliquis 5 MG Tabs tablet Generic drug: apixaban TAKE 1 TABLET BY MOUTH TWO  TIMES DAILY What changed: how much to take   latanoprost 0.005 % ophthalmic solution Commonly known as: XALATAN Place 1 drop into both eyes at bedtime.    levothyroxine 100 MCG tablet Commonly known as: SYNTHROID Take 100 mcg by mouth daily before breakfast.   lidocaine 5 % Commonly known as: LIDODERM Place 1 patch onto the skin daily. Remove & Discard patch within 12 hours or as directed by MD   methocarbamol 500 MG tablet Commonly known as: ROBAXIN Take 1 tablet (500 mg total) by mouth every 8 (eight) hours as needed for muscle spasms.   omeprazole 20 MG capsule Commonly known as: PRILOSEC Take 20 mg by mouth daily before breakfast.   traMADol 50 MG tablet Commonly known as: ULTRAM Take 1-2 tablets (50-100 mg total) by mouth every 6 (six) hours as needed for moderate pain or severe pain.   triamcinolone cream 0.1 % Commonly known as: KENALOG Apply 1 application topically daily as needed for itching.       Contact information for follow-up providers    Erroll Luna, MD. Go on 10/02/2019.   Specialty: General Surgery Why: Please arrive by 1:00 PM for 1:30 PM appointment. Bring photo ID and insurance information with you.  Contact information: [address] [place] [address] [phone]        [facility] In 6 weeks.   Specialty: Vascular Surgery Why: Office will call you to arrange your appt (sent) Contact information: [place] [phone]           Contact information for  after-discharge care    [facility] .   Service: Skilled Nursing Contact information: [address] [place] [phone]                 Allergies  Allergen Reactions  . Aspirin Other (See Comments)    Red splotches  . Atorvastatin Other (See Comments)    Muscle spams  . Nabumetone Itching  . Nsaids     Reaction (??)  . Pravastatin Other (See Comments)    Stiff walking difficulty Stiff walking difficulty  . Procaine Swelling  . Rosuvastatin Nausea Only and Other (See  Comments)    Reflux and Muscle stiffness  . Simvastatin Nausea Only and Other (See Comments)    Reflux and Muscle stiffness  . Statins Other (See Comments)  . Evolocumab Rash  . Mobic [Meloxicam] Rash  . Penicillins Rash    Has patient had a PCN reaction causing immediate rash, facial/tongue/throat swelling, SOB or lightheadedness with hypotension: Yes Has patient had a PCN reaction causing severe rash involving mucus membranes or skin necrosis: No Has patient had a PCN reaction that required hospitalization: No Has patient had a PCN reaction occurring within the last 10 years: No If all of the above answers are "NO", then may proceed with Cephalosporin use.      Consultations:  Vascular Surgery  Cardiology  Procedures/Studies: Ct Abdomen Pelvis Wo Contrast  Result Date: 08/29/2019 CLINICAL DATA:  Abdominal pain, acute EXAM: CT ABDOMEN AND PELVIS WITHOUT CONTRAST TECHNIQUE: Multidetector CT imaging of the abdomen and pelvis was performed following the standard protocol without IV contrast. COMPARISON:  August 03, 2016 FINDINGS: Lower chest: The visualized heart size within normal limits. No pericardial fluid/thickening. Again noted is a large hiatal hernia with almost the entirety of the stomach rotated and flipped upward is, consistent with a gastric volvulus. The portion of the herniated stomach is markedly dilated with air and food stuff which suggest gastric outlet obstruction. Hepatobiliary: Although limited due to the lack of intravenous contrast, normal in appearance without gross focal abnormality. The patient is status post cholecystectomy. No biliary ductal dilation. Pancreas:  Unremarkable.  No surrounding inflammatory changes. Spleen: Normal in size. Although limited due to the lack of intravenous contrast, normal in appearance. Adrenals/Urinary Tract: Both adrenal glands appear normal. No renal or collecting system calculi. There is left renal atrophy seen. The bladder is  unremarkable. Stomach/Bowel: The small bowel and colon are normal in appearance. Scattered colonic diverticula are noted without diverticulitis. Vascular/Lymphatic: There are no enlarged abdominal or pelvic lymph nodes. Aortic Atherosclerosis (ICD10-I70.0). Reproductive: Calcified uterine fibroids are present. Other: No evidence of abdominal wall mass or hernia. Musculoskeletal: No acute or significant osseous findings. IMPRESSION: Large hiatal hernia with gastric volvulus as on prior exam, however now there is a markedly dilated debris-filled stomach which is suggestive of gastric outlet obstruction. Electronically Signed   By: Prudencio Pair M.D.   On: 08/29/2019 03:13   Dg Abd 1 View  Result Date: 08/31/2019 CLINICAL DATA:  NG tube placement. EXAM: ABDOMEN - 1 VIEW COMPARISON:  08/21/2019 FINDINGS: The nasogastric tube is been retracted slightly since the prior exam. It is coiled within the known intrathoracic stomach. Cardiac leads and tubing over the chest. Cardiomediastinal contours are unchanged. IMPRESSION: The nasogastric tube is been retracted slightly since the prior exam. It is coiled within the known intrathoracic stomach. No other interval changes on this limited fluoroscopic assessment. Electronically Signed   By: Cay Schillings  Wile M.D.   On: 08/31/2019 17:30   Dg Abd 1 View  Result Date: 08/29/2019 CLINICAL DATA:  NG tube placement. EXAM: ABDOMEN - 1 VIEW; DG NASO G TUBE PLC W/FL-NO RAD COMPARISON:  CT 08/29/2019. FINDINGS: NG tube noted with tip coiled in the large hiatal hernia. Cardiac monitor noted chest. Cardiac leads and tubing noted over the chest. IMPRESSION: NG tube noted with its tip coiled in the patient's known large hiatal hernia. Electronically Signed   By: Marcello Moores  Register   On: 08/29/2019 12:59   Dg Chest Portable 1 View  Result Date: 08/29/2019 CLINICAL DATA:  Chest pain and shortness of breath EXAM: PORTABLE CHEST 1 VIEW COMPARISON:  08/03/2016 FINDINGS: Cardiac shadow is  stable. Aortic calcifications are seen. Loop recorder is noted on the left. Large hiatal hernia is again seen but increased when compared with the prior study. No focal infiltrate or sizable effusion is seen. No bony abnormality is noted. IMPRESSION: Changes consistent with large hiatal hernia which is increased in size from the prior study. No new focal abnormality is noted. Electronically Signed   By: Inez Catalina M.D.   On: 08/29/2019 00:30   Dg [place] Dys Tube Plc W/fl-no Rad  Result Date: 09/18/2019 Fluoroscopy was utilized by the requesting physician.  No radiographic interpretation.   Dg Naso G Tube Plc W/fl-no Rad  Result Date: 08/29/2019 CLINICAL DATA:  NG tube placement. EXAM: ABDOMEN - 1 VIEW; DG NASO G TUBE PLC W/FL-NO RAD COMPARISON:  CT 08/29/2019. FINDINGS: NG tube noted with tip coiled in the large hiatal hernia. Cardiac monitor noted chest. Cardiac leads and tubing noted over the chest. IMPRESSION: NG tube noted with its tip coiled in the patient's known large hiatal hernia. Electronically Signed   By: Marcello Moores  Register   On: 08/29/2019 12:59   Vas [place] Groin Pseudoaneurysm  Result Date: 09/18/2019  ARTERIAL PSEUDOANEURYSM  Exam: Right groin Indications: Patient complains of groin pain. Comparison Study: No prior study on file Performing Technologist: Sharion Dove RVS  Examination Guidelines: A complete evaluation includes B-mode imaging, spectral Doppler, color Doppler, and power Doppler as needed of all accessible portions of each vessel. Bilateral testing is considered an integral part of a complete examination. Limited examinations for reoccurring indications may be performed as noted.  Summary: No pseudoaneurysm noted in the right groin. There is hematoma noted in the groin and in the proximal thigh. Diagnosing physician: Ruta Hinds MD Electronically signed by Ruta Hinds MD on 09/18/2019 at 3:44:35 PM.    --------------------------------------------------------------------------------    Final    Vas [place] Abi With/wo Tbi  Result Date: 09/07/2019 LOWER EXTREMITY DOPPLER STUDY Indications: Discoloration and pain. High Risk Factors: Hypertension, hyperlipidemia, prior CVA.  Comparison Study: 11/04/17 previous Performing Technologist: Abram Sander RVS  Examination Guidelines: A complete evaluation includes at minimum, Doppler waveform signals and systolic blood pressure reading at the level of bilateral brachial, anterior tibial, and posterior tibial arteries, when vessel segments are accessible. Bilateral testing is considered an integral part of a complete examination. Photoelectric Plethysmograph (PPG) waveforms and toe systolic pressure readings are included as required and additional duplex testing as needed. Limited examinations for reoccurring indications may be performed as noted.  ABI Findings: +--------+------------------+-----+---------+--------+ Right   Rt Pressure (mmHg)IndexWaveform Comment  +--------+------------------+-----+---------+--------+ [id number]                    triphasic         +--------+------------------+-----+---------+--------+ PTA     78  0.57 biphasic          +--------+------------------+-----+---------+--------+ DP      82                0.60 biphasic          +--------+------------------+-----+---------+--------+ +--------+------------------+-----+--------+--------------------+ Left    Lt Pressure (mmHg)IndexWaveformComment              +--------+------------------+-----+--------+--------------------+ Brachial                       biphasicrestricted extremity +--------+------------------+-----+--------+--------------------+ PTA     70                0.51 biphasic                     +--------+------------------+-----+--------+--------------------+ DP      61                0.45 biphasic                      +--------+------------------+-----+--------+--------------------+ +-------+-----------+-----------+------------+------------+ ABI/TBIToday's ABIToday's TBIPrevious ABIPrevious TBI +-------+-----------+-----------+------------+------------+ Right  0.60                                           +-------+-----------+-----------+------------+------------+ Left   0.51                                           +-------+-----------+-----------+------------+------------+  Summary: Right: Resting right ankle-brachial index indicates moderate right lower extremity arterial disease. Left: Resting left ankle-brachial index indicates moderate left lower extremity arterial disease.  *See table(s) above for measurements and observations.  Electronically signed by Monica Martinez MD on 09/07/2019 at 7:15:20 PM.   Final     Subjective: Eager to go to rehab  Discharge Exam: Vitals:   09/26/19 0400 09/26/19 0608  BP:  132/71  Pulse: 95 99  Resp: 20   Temp:  98 F (36.7 C)  SpO2: 100% 100%   Vitals:   09/25/19 1512 09/25/19 2039 09/26/19 0400 09/26/19 0608  BP: (!) 146/80 140/65  132/71  Pulse: (!) 108 (!) 101 95 99  Resp: (!) 22  20   Temp: 98.4 F (36.9 C) 98 F (36.7 C)  98 F (36.7 C)  TempSrc: Oral Oral  Oral  SpO2: 100% 96% 100% 100%  Weight:    73.4 kg  Height:        General: Pt is alert, awake, not in acute distress Cardiovascular: RRR, S1/S2 +, no rubs, no gallops Respiratory: CTA bilaterally, no wheezing, no rhonchi Abdominal: Soft, NT, ND, bowel sounds + Extremities: no edema, no cyanosis   The results of significant diagnostics from this hospitalization (including imaging, microbiology, ancillary and laboratory) are listed below for reference.     Microbiology: Recent Results (from the past 240 hour(s))  SARS CORONAVIRUS 2 (TAT 6-24 HRS) Nasopharyngeal Nasopharyngeal Swab     Status: None   Collection Time: 09/18/19 10:08 AM   Specimen: Nasopharyngeal Swab   Result Value Ref Range Status   SARS Coronavirus 2 NEGATIVE NEGATIVE Final    Comment: (NOTE) SARS-CoV-2 target nucleic acids are NOT DETECTED. The SARS-CoV-2 RNA is generally detectable in upper and lower respiratory specimens during the acute phase of infection. Negative results do not  preclude SARS-CoV-2 infection, do not rule out co-infections with other pathogens, and should not be used as the sole basis for treatment or other patient management decisions. Negative results must be combined with clinical observations, patient history, and epidemiological information. The expected result is Negative. Fact Sheet for Patients: SugarRoll.be Fact Sheet for Healthcare Providers: https://www.woods-mathews.com/ This test is not yet approved or cleared by the [place] FDA and  has been authorized for detection and/or diagnosis of SARS-CoV-2 by FDA under an Emergency Use Authorization (EUA). This EUA will remain  in effect (meaning this test can be used) for the duration of the COVID-19 declaration under Section 56 4(b)(1) of the Act, 21 U.S.C. section 360bbb-3(b)(1), unless the authorization is terminated or revoked sooner. Performed at [facility], [place] [address] [place], [place] [address]   SARS CORONAVIRUS 2 (TAT 6-24 HRS) Nasopharyngeal Nasopharyngeal Swab     Status: None   Collection Time: 09/21/19  2:30 PM   Specimen: Nasopharyngeal Swab  Result Value Ref Range Status   SARS Coronavirus 2 NEGATIVE NEGATIVE Final    Comment: (NOTE) SARS-CoV-2 target nucleic acids are NOT DETECTED. The SARS-CoV-2 RNA is generally detectable in upper and lower respiratory specimens during the acute phase of infection. Negative results do not preclude SARS-CoV-2 infection, do not rule out co-infections with other pathogens, and should not be used as the sole basis for treatment or other patient management decisions. Negative results must be  combined with clinical observations, patient history, and epidemiological information. The expected result is Negative. Fact Sheet for Patients: SugarRoll.be Fact Sheet for Healthcare Providers: https://www.woods-mathews.com/ This test is not yet approved or cleared by the [place] FDA and  has been authorized for detection and/or diagnosis of SARS-CoV-2 by FDA under an Emergency Use Authorization (EUA). This EUA will remain  in effect (meaning this test can be used) for the duration of the COVID-19 declaration under Section 56 4(b)(1) of the Act, 21 U.S.C. section 360bbb-3(b)(1), unless the authorization is terminated or revoked sooner. Performed at [facility], [place] [address], [place], [place] [address]   SARS CORONAVIRUS 2 (TAT 6-24 HRS) Nasopharyngeal Nasopharyngeal Swab     Status: None   Collection Time: 09/25/19  5:20 PM   Specimen: Nasopharyngeal Swab  Result Value Ref Range Status   SARS Coronavirus 2 NEGATIVE NEGATIVE Final    Comment: (NOTE) SARS-CoV-2 target nucleic acids are NOT DETECTED. The SARS-CoV-2 RNA is generally detectable in upper and lower respiratory specimens during the acute phase of infection. Negative results do not preclude SARS-CoV-2 infection, do not rule out co-infections with other pathogens, and should not be used as the sole basis for treatment or other patient management decisions. Negative results must be combined with clinical observations, patient history, and epidemiological information. The expected result is Negative. Fact Sheet for Patients: SugarRoll.be Fact Sheet for Healthcare Providers: https://www.woods-mathews.com/ This test is not yet approved or cleared by the [place] FDA and  has been authorized for detection and/or diagnosis of SARS-CoV-2 by FDA under an Emergency Use Authorization (EUA). This EUA will remain  in effect (meaning  this test can be used) for the duration of the COVID-19 declaration under Section 56 4(b)(1) of the Act, 21 U.S.C. section 360bbb-3(b)(1), unless the authorization is terminated or revoked sooner. Performed at [facility], [place] [address], [place], [place] [address]      Labs: BNP (last 3 results) No results for input(s): BNP in the last 8760 hours. Basic Metabolic  Panel: Recent Labs  Lab 09/20/19 1130 09/24/19 0432 09/25/19 0357  NA 136 135 137  K 4.5 4.2 4.1  CL 98 97* 99  CO2 27 28 28   GLUCOSE 122* 107* 113*  BUN 22 18 16   CREATININE 1.01* 1.11* 1.20*  CALCIUM 8.8* 8.4* 8.6*   Liver Function Tests: Recent Labs  Lab 09/25/19 0357  AST 13*  ALT 14  ALKPHOS 80  BILITOT 0.3  PROT 5.3*  ALBUMIN 2.1*   No results for input(s): LIPASE, AMYLASE in the last 168 hours. No results for input(s): AMMONIA in the last 168 hours. CBC: Recent Labs  Lab 09/24/19 0432 09/25/19 0357  WBC 7.7 7.6  HGB 7.3* 7.7*  HCT 23.1* 24.8*  MCV 91.3 91.9  PLT 335 359   Cardiac Enzymes: No results for input(s): CKTOTAL, CKMB, CKMBINDEX, TROPONINI in the last 168 hours. BNP: Invalid input(s): POCBNP CBG: Recent Labs  Lab 09/25/19 0003 09/25/19 0743 09/25/19 1652 09/26/19 0000 09/26/19 0855  GLUCAP 109* 100* 128* 119* 99   D-Dimer No results for input(s): DDIMER in the last 72 hours. Hgb A1c No results for input(s): HGBA1C in the last 72 hours. Lipid Profile No results for input(s): CHOL, HDL, LDLCALC, TRIG, CHOLHDL, LDLDIRECT in the last 72 hours. Thyroid function studies No results for input(s): TSH, T4TOTAL, T3FREE, THYROIDAB in the last 72 hours.  Invalid input(s): FREET3 Anemia work up No results for input(s): VITAMINB12, FOLATE, FERRITIN, TIBC, IRON, RETICCTPCT in the last 72 hours. Urinalysis No results found for: COLORURINE, APPEARANCEUR, [place], [place], [place], [place], [place], [place], PROTEINUR, UROBILINOGEN, NITRITE, LEUKOCYTESUR Sepsis  Labs Invalid input(s): PROCALCITONIN,  WBC,  LACTICIDVEN Microbiology Recent Results (from the past 240 hour(s))  SARS CORONAVIRUS 2 (TAT 6-24 HRS) Nasopharyngeal Nasopharyngeal Swab     Status: None   Collection Time: 09/18/19 10:08 AM   Specimen: Nasopharyngeal Swab  Result Value Ref Range Status   SARS Coronavirus 2 NEGATIVE NEGATIVE Final    Comment: (NOTE) SARS-CoV-2 target nucleic acids are NOT DETECTED. The SARS-CoV-2 RNA is generally detectable in upper and lower respiratory specimens during the acute phase of infection. Negative results do not preclude SARS-CoV-2 infection, do not rule out co-infections with other pathogens, and should not be used as the sole basis for treatment or other patient management decisions. Negative results must be combined with clinical observations, patient history, and epidemiological information. The expected result is Negative. Fact Sheet for Patients: SugarRoll.be Fact Sheet for Healthcare Providers: https://www.woods-mathews.com/ This test is not yet approved or cleared by the [place] FDA and  has been authorized for detection and/or diagnosis of SARS-CoV-2 by FDA under an Emergency Use Authorization (EUA). This EUA will remain  in effect (meaning this test can be used) for the duration of the COVID-19 declaration under Section 56 4(b)(1) of the Act, 21 U.S.C. section 360bbb-3(b)(1), unless the authorization is terminated or revoked sooner. Performed at [facility], [place] [address], [place], [place] [address]   SARS CORONAVIRUS 2 (TAT 6-24 HRS) Nasopharyngeal Nasopharyngeal Swab     Status: None   Collection Time: 09/21/19  2:30 PM   Specimen: Nasopharyngeal Swab  Result Value Ref Range Status   SARS Coronavirus 2 NEGATIVE NEGATIVE Final    Comment: (NOTE) SARS-CoV-2 target nucleic acids are NOT DETECTED. The SARS-CoV-2 RNA is generally detectable in upper and lower respiratory  specimens during the acute phase of infection. Negative results do not preclude SARS-CoV-2 infection, do not rule out co-infections with other pathogens, and should not be  used as the sole basis for treatment or other patient management decisions. Negative results must be combined with clinical observations, patient history, and epidemiological information. The expected result is Negative. Fact Sheet for Patients: SugarRoll.be Fact Sheet for Healthcare Providers: https://www.woods-mathews.com/ This test is not yet approved or cleared by the [place] FDA and  has been authorized for detection and/or diagnosis of SARS-CoV-2 by FDA under an Emergency Use Authorization (EUA). This EUA will remain  in effect (meaning this test can be used) for the duration of the COVID-19 declaration under Section 56 4(b)(1) of the Act, 21 U.S.C. section 360bbb-3(b)(1), unless the authorization is terminated or revoked sooner. Performed at [facility], [place] [address] [place], [place] [address]   SARS CORONAVIRUS 2 (TAT 6-24 HRS) Nasopharyngeal Nasopharyngeal Swab     Status: None   Collection Time: 09/25/19  5:20 PM   Specimen: Nasopharyngeal Swab  Result Value Ref Range Status   SARS Coronavirus 2 NEGATIVE NEGATIVE Final    Comment: (NOTE) SARS-CoV-2 target nucleic acids are NOT DETECTED. The SARS-CoV-2 RNA is generally detectable in upper and lower respiratory specimens during the acute phase of infection. Negative results do not preclude SARS-CoV-2 infection, do not rule out co-infections with other pathogens, and should not be used as the sole basis for treatment or other patient management decisions. Negative results must be combined with clinical observations, patient history, and epidemiological information. The expected result is Negative. Fact Sheet for Patients: SugarRoll.be Fact Sheet for Healthcare  Providers: https://www.woods-mathews.com/ This test is not yet approved or cleared by the [place] FDA and  has been authorized for detection and/or diagnosis of SARS-CoV-2 by FDA under an Emergency Use Authorization (EUA). This EUA will remain  in effect (meaning this test can be used) for the duration of the COVID-19 declaration under Section 56 4(b)(1) of the Act, 21 U.S.C. section 360bbb-3(b)(1), unless the authorization is terminated or revoked sooner. Performed at [facility], [place] [address] [place], [place] [address]    Time spent: 30 min  SIGNED:   Marylu Lund, MD  [facility] 09/26/2019, 9:13 AM  If 7PM-7AM, please contact night-coverage

## 2019-09-25 NOTE — TOC Transition Note (Signed)
Transition of Care [facility]) - CM/SW Discharge Note   Patient Details  Name: Alyssa Odonnell MRN: [id number] Date of Birth: 10-Mar-1934  [facility]) CM/SW Contact:  Eileen Stanford, LCSW Phone Number: 09/25/2019, 3:07 PM   Clinical Narrative:   Clinical Social Worker facilitated patient discharge including contacting patient family and facility to confirm patient discharge plans.  Clinical information faxed to facility and family agreeable with plan.  CSW arranged ambulance transport via [facility] to [facility] (room 408).  RN to call [phone] for report prior to discharge.    Final next level of care: Skilled Nursing Facility Barriers to Discharge: No Barriers Identified   Patient Goals and CMS Choice Patient states their goals for this hospitalization and ongoing recovery are:: "to go to rehab" CMS Medicare.gov Compare Post Acute Care list provided to:: Patient Represenative (must comment) Choice offered to / list presented to : Adult Children  Discharge Placement              Patient [facility] bed at: [name] Patient to be transferred to facility by: [place] Name of family member notified: pt alert and oriented Patient and family notified of of transfer: 09/25/19  Discharge Plan and Services In-house Referral: NA   Post Acute Care Choice: [facility]                               Social Determinants of Health (SDOH) Interventions     Readmission Risk Interventions No flowsheet data found.

## 2019-09-25 NOTE — Progress Notes (Signed)
Initial Nutrition Assessment  INTERVENTION:   -Boost Breeze po BID, each supplement provides 250 kcal and 9 grams of protein -Magic cup BID with meals, each supplement provides 290 kcal and 9 grams of protein -D/c Ensure -pt not drinking  NUTRITION DIAGNOSIS:   Inadequate oral intake related to poor appetite as evidenced by per patient/family report.  GOAL:   Patient will meet greater than or equal to 90% of their needs  MONITOR:   PO intake, Supplement acceptance, Labs, Weight trends, I & O's  REASON FOR ASSESSMENT:   Consult Assessment of nutrition requirement/status  ASSESSMENT:   85 y.o. 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  With  multiple unsuccessful attempts to pass NG tube at bedside as well under fluoroscopy.  General surgery was consulted and he underwent last for scopic gastropexy on 09/01/2019 with placement of a 20 French gastrostomy tube and extensive adhesion lysis.During this hospitalization she underwent left lower extremity arteriogram and stenting on 11/23.  **RD working remotely**  Patient with LOS of 27 days. Reports to MD that she is not eating well as she is not liking the food provided at the hospital. Receptive to supplements. Ensure Enlive supplements have been ordered for patient but pt not drinking them consistently. Will switch to Boost Breeze supplements and add Magic Cups to meals for variety.  PO intakes have been 0-50% of per PO documentation.   Admission weight: 167 lbs. Current weight: 165 lbs. Weights have remained stable throughout admission.  I/Os: -839 ml since 11/23 UOP: 400 ml x 24  hrs  Medications reviewed.  Labs reviewed: CBGs: 100-109  NUTRITION - FOCUSED PHYSICAL EXAM:  Working remotely.  Diet Order:   Diet Order            Diet regular Room service appropriate? Yes; Fluid consistency: Thin  Diet effective now              EDUCATION NEEDS:   No education needs have been identified at this time  Skin:  Skin Assessment: Reviewed RN Assessment  Last BM:  12/6  Height:   Ht Readings from Last 1 Encounters:  09/01/19 5\' 3"  (1.6 m)    Weight:   Wt Readings from Last 1 Encounters:  09/25/19 75 kg    Ideal Body Weight:  52.3 kg  BMI:  Body mass index is 29.29 kg/m.  Estimated Nutritional Needs:   Kcal:  1300-1500  Protein:  65-75g  Fluid:  1.5L/day  Clayton Bibles, MS, RD, LDN Inpatient Clinical Dietitian Pager: [phone] After Hours Pager: [phone]

## 2019-09-25 NOTE — Progress Notes (Signed)
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 CT scan of the abdomen showed large hiatal hernia with gastric volvulus and gastric outlet obstruction.Withmultiple unsuccessful attempts to pass NG tube at bedside as well under fluoroscopy. General surgery was consulted and he underwent last for scopic gastropexy on 09/01/2019 with placement of a 20 French gastrostomy tube and extensive adhesion lysis. During this hospitalization he underwent left lower extremity arteriogram and stenting.  Assessment & Plan:   Principal Problem:   Volvulus of stomach Active Problems:   PAD (peripheral artery disease) (HCC)   Essential hypertension   PAF (paroxysmal atrial fibrillation) (HCC)   Gastric outlet obstruction  Large hiatal hernia with gastric outlet obstruction: -No changes,CT scan on admission show a large hiatal hernia with gastric outlet obstruction and volvulus. -Underwent laparoscopic gastric pexy on 09/01/2019 with placement of a 20 French gastrostomy tube and lysis of adhesions. -Very slow to recover -Significant improvement in nausea and vomiting -Tolerating diet -Per general surgery will require EGD in 6 weeks.   Acute ischemic left fourth and fifth toe/peripheral vascular disease: -His left fourth and fifth toe became cyanotic and painful he is known to have an SFA occlusion with three-vessel runoff in 2017. -ABI showed moderate right lower extremity arterial disease. -Vascular surgery was consulted he is status post arteriogram on 09/11/2019 with a stent to the left SFA and balloon angioplasty of the trunk.  -Vascular recommended to come continue Eliquis.  -She is also on Plavix.  -Patient developed a small right groin hematoma at the catheter site which causes her pain at times. Encouraged her to continue PT and to massage the area TID to promote healing.  Acute blood loss anemia/groin hematoma post arteriogram/anemia of chronic disease: -With a hemoglobin9.4 11/29 -Status post 2 unit of packed red blood cells on 09/13/2019. -Currently on Eliquis. -Hgb has trended down to 7.3 (from 9.4 on 11/29) but with no active bleeding.  She feels weak but denies dyspnea, lightheadedness. Will repeat CBC in am, may need transfusion if Hgb remains close to 7 and she is symptomatic.  -No signs of active bleeding at this time  Right hip pain: -Suspect bursitis: Patient currently using lidocaine patch. Continue to titrate down pain medications. -Stable at this time  Hypokalemia: -Now resolved.  Paroxysmal atrial fibrillation: -Continue amiodarone and Eliquis. -Cardiology had been following  Essential hypertension: -Blood pressure fairly controlled off antihypertensive medication continue to monitor.  Chronic kidney disease stage III: -With a baseline creatinine of 1.3-1.5, patient was started on IV fluids. Sheis currently taking regular oral intake, no longer on IV fluids.    Hypothyroidism: -Continue Synthroid as tolerated. Will need repeat thyroid studies in 4 to 6 weeks.  Decreased caloric intake.  Patient reports not liking the food as much.  Will place dietitian consultation.   DVT prophylaxis: Eliquis Code Status: Full Family Communication: Pt in room, family not at bedside Disposition Plan: Likely SNF in 24hrs  Consultants:   Vascular surgery  Cardiology  General Surgery  Procedures:     Antimicrobials: Anti-infectives (From admission, onward)   Start     Dose/Rate Route Frequency Ordered Stop   09/01/19 1400  ciprofloxacin (CIPRO) IVPB 400 mg     400 mg  200 mL/hr over 60 Minutes Intravenous On call to O.R. 09/01/19 0738 09/01/19 1615       Subjective: Eager to start rehab soon  Objective: Vitals:   09/25/19 0659 09/25/19 0700 09/25/19 1030 09/25/19 1512  BP: 123/75 123/75  (!) 146/80  Pulse: 93 93  (!) 108  Resp: 20 20  (!) 22  Temp: 97.7 F (36.5 C)   98.4 F (36.9 C)  TempSrc: Oral   Oral  SpO2: 100% 99% 99% 100%  Weight: 75 kg     Height:        Intake/Output Summary (Last 24 hours) at 09/25/2019 1743 Last data filed at 09/25/2019 1500 Gross per 24 hour  Intake 240 ml  Output -  Net 240 ml   Filed Weights   09/23/19 0448 09/24/19 0440 09/25/19 0659  Weight: 76.9 kg 76.5 kg 75 kg    Examination:  General exam: Appears calm and comfortable  Respiratory system: Clear to auscultation. Respiratory effort normal. Cardiovascular system: S1 & S2 heard, Regular Gastrointestinal system: Abdomen is nondistended, soft and nontender. No organomegaly or masses felt. Normal bowel sounds heard. Central nervous system: Alert and oriented. No focal neurological deficits. Extremities: Symmetric 5 x 5 power. Skin: No rashes, lesions Psychiatry: Judgement and insight appear normal. Mood & affect appropriate.   Data Reviewed: I have personally reviewed following labs and imaging studies  CBC: Recent Labs  Lab 09/24/19 0432 09/25/19 0357  WBC 7.7 7.6  HGB 7.3* 7.7*  HCT 23.1* 24.8*  MCV 91.3 91.9  PLT 335 [id number]   Basic Metabolic Panel: Recent Labs  Lab 09/19/19 0426 09/20/19 1130 09/24/19 0432 09/25/19 0357  NA 136 136 135 137  K 4.6 4.5 4.2 4.1  CL 101 98 97* 99  CO2 25 27 28 28   GLUCOSE 123* 122* 107* 113*  BUN 28* 22 18 16   CREATININE 1.22* 1.01* 1.11* 1.20*  CALCIUM 8.4* 8.8* 8.4* 8.6*   GFR: Estimated Creatinine Clearance: 34.4 mL/min (A) (by C-G formula based on SCr of 1.2 mg/dL (H)). Liver Function Tests: Recent Labs  Lab 09/25/19 0357  AST 13*  ALT 14  ALKPHOS 80  BILITOT 0.3  PROT 5.3*  ALBUMIN  2.1*   No results for input(s): LIPASE, AMYLASE in the last 168 hours. No results for input(s): AMMONIA in the last 168 hours. Coagulation Profile: No results for input(s): INR, PROTIME in the last 168 hours. Cardiac Enzymes: No results for input(s): CKTOTAL, CKMB, CKMBINDEX, TROPONINI in the last 168 hours. BNP (last 3 results) No results for input(s): PROBNP in the last 8760 hours. HbA1C: No results for input(s): HGBA1C in the last 72 hours. CBG: Recent Labs  Lab 09/24/19 0803 09/24/19 1526 09/25/19 0003 09/25/19 0743 09/25/19 1652  GLUCAP 108* 119* 109* 100* 128*   Lipid Profile: No results for input(s): CHOL, HDL, LDLCALC, TRIG, CHOLHDL, LDLDIRECT in the last 72 hours. Thyroid Function Tests: No results for input(s): TSH, T4TOTAL, FREET4, T3FREE, THYROIDAB in the last 72 hours. Anemia Panel: No results for input(s): VITAMINB12, FOLATE, FERRITIN, TIBC, IRON, RETICCTPCT in the last 72 hours. Sepsis Labs: No results for input(s): PROCALCITON, LATICACIDVEN in the last 168 hours.  Recent Results (from the past 240 hour(s))  SARS CORONAVIRUS 2 (TAT 6-24 HRS) Nasopharyngeal Nasopharyngeal Swab     Status: None   Collection Time: 09/18/19 10:08 AM   Specimen: Nasopharyngeal Swab  Result Value Ref Range Status   SARS Coronavirus  2 NEGATIVE NEGATIVE Final    Comment: (NOTE) SARS-CoV-2 target nucleic acids are NOT DETECTED. The SARS-CoV-2 RNA is generally detectable in upper and lower respiratory specimens during the acute phase of infection. Negative results do not preclude SARS-CoV-2 infection, do not rule out co-infections with other pathogens, and should not be used as the sole basis for treatment or other patient management decisions. Negative results must be combined with clinical observations, patient history, and epidemiological information. The expected result is Negative. Fact Sheet for Patients: SugarRoll.be Fact Sheet for Healthcare  Providers: https://www.woods-mathews.com/ This test is not yet approved or cleared by the [place] FDA and  has been authorized for detection and/or diagnosis of SARS-CoV-2 by FDA under an Emergency Use Authorization (EUA). This EUA will remain  in effect (meaning this test can be used) for the duration of the COVID-19 declaration under Section 56 4(b)(1) of the Act, 21 U.S.C. section 360bbb-3(b)(1), unless the authorization is terminated or revoked sooner. Performed at [facility], [place] [address], [place], [place] [address]   SARS CORONAVIRUS 2 (TAT 6-24 HRS) Nasopharyngeal Nasopharyngeal Swab     Status: None   Collection Time: 09/21/19  2:30 PM   Specimen: Nasopharyngeal Swab  Result Value Ref Range Status   SARS Coronavirus 2 NEGATIVE NEGATIVE Final    Comment: (NOTE) SARS-CoV-2 target nucleic acids are NOT DETECTED. The SARS-CoV-2 RNA is generally detectable in upper and lower respiratory specimens during the acute phase of infection. Negative results do not preclude SARS-CoV-2 infection, do not rule out co-infections with other pathogens, and should not be used as the sole basis for treatment or other patient management decisions. Negative results must be combined with clinical observations, patient history, and epidemiological information. The expected result is Negative. Fact Sheet for Patients: SugarRoll.be Fact Sheet for Healthcare Providers: https://www.woods-mathews.com/ This test is not yet approved or cleared by the [place] FDA and  has been authorized for detection and/or diagnosis of SARS-CoV-2 by FDA under an Emergency Use Authorization (EUA). This EUA will remain  in effect (meaning this test can be used) for the duration of the COVID-19 declaration under Section 56 4(b)(1) of the Act, 21 U.S.C. section 360bbb-3(b)(1), unless the authorization is terminated or revoked sooner. Performed at  [facility], [place] [address] [place], [place] [address]      Radiology Studies: No results found.  Scheduled Meds: . amiodarone  200 mg Oral Daily  . apixaban  5 mg Oral BID  . clopidogrel  75 mg Oral Q breakfast  . cyclobenzaprine  5 mg Oral TID  . docusate sodium  100 mg Oral Daily  . feeding supplement  1 Container Oral BID BM  . latanoprost  1 drop Both Eyes QHS  . levothyroxine  100 mcg Oral Q0600  . lidocaine  1 patch Transdermal Q24H  . pantoprazole  40 mg Oral Daily  . polyethylene glycol  17 g Oral BID  . sodium chloride flush  3 mL Intravenous Q12H   Continuous Infusions: . sodium chloride    . sodium chloride Stopped (09/22/19 0855)     LOS: 27 days   Marylu Lund, MD [facility] Pager On Amion  If 7PM-7AM, please contact night-coverage 09/25/2019, 5:43 PM

## 2019-09-25 NOTE — Progress Notes (Signed)
Physical Therapy Treatment Patient Details Name: Alyssa Odonnell MRN: [id number] DOB: Aug 03, 1934 Today's Date: 09/25/2019    History of Present Illness Pt is an 85 y.o. female admitted 08/28/19 with worsening epigastric pain. Worked up for large hiatal hernia with gastric outlet obstruction. S/p laparoscopic gastropexy with placement of gastrostomy tube with extensive lysis11/13. PMH includes afib, stroke, HTN, glaucoma. During admission, pt developed acute ischemia of left fourth and fifth toe and is now s/p arteriogram with stent placement 11-23, and has since had 2 units of blood trasnfused (11/24 and 11/25).    PT Comments    Patient received in bed, eager to get OOB but very talkative requiring frequent redirection. Able to complete bed mobility with MinA, functional transfers with ModA and RW, gait 102fx2 with HR to 130s; note SpO2 signal unreliable but patient satting 100% on room air at rest and able to maintain conversation with PT on room air without SOB or signs of desaturation. She was left up in the chair with all needs met, chair alarm active, and RN aware of patient status. Continues to remain appropriate for SNF.     Follow Up Recommendations  SNF;Supervision for mobility/OOB     Equipment Recommendations  None recommended by PT    Recommendations for Other Services       Precautions / Restrictions Precautions Precautions: Fall Precaution Comments: g tube Restrictions Weight Bearing Restrictions: No    Mobility  Bed Mobility Overal bed mobility: Needs Assistance Bed Mobility: Supine to Sit     Supine to sit: Min assist;HOB elevated     General bed mobility comments: MinA, extended time, assist for sore R LE  Transfers Overall transfer level: Needs assistance Equipment used: Rolling walker (2 wheeled) Transfers: Sit to/from Stand Sit to Stand: Mod assist         General transfer comment: ModA to power up, cues for hand  placement  Ambulation/Gait Ambulation/Gait assistance: Min guard Gait Distance (Feet): 15 Feet(x2) Assistive device: Rolling walker (2 wheeled) Gait Pattern/deviations: Step-through pattern;Decreased stride length;Trunk flexed;Antalgic Gait velocity: Decreased   General Gait Details: flexed posture, fatigues very easy and tachy up to 134BPM even with sitting rest break on toilet   Stairs             Wheelchair Mobility    Modified Rankin (Stroke Patients Only)       Balance Overall balance assessment: Needs assistance Sitting-balance support: Feet supported;Bilateral upper extremity supported Sitting balance-Leahy Scale: Good     Standing balance support: Bilateral upper extremity supported;During functional activity Standing balance-Leahy Scale: Poor Standing balance comment: reliant on external support                            Cognition Arousal/Alertness: Awake/alert Behavior During Therapy: WFL for tasks assessed/performed;Anxious Overall Cognitive Status: Within Functional Limits for tasks assessed                                 General Comments: very talkative, still some anxiety with mobility      Exercises      General Comments General comments (skin integrity, edema, etc.): HR in 130s with mobility      Pertinent Vitals/Pain Pain Assessment: Faces Faces Pain Scale: Hurts a little bit Pain Location: bilat knees and R hip Pain Descriptors / Indicators: Aching;Discomfort Pain Intervention(s): Limited activity within patient's tolerance;Monitored during session;Repositioned    Home  Living                      Prior Function            PT Goals (current goals can now be found in the care plan section) Acute Rehab PT Goals Patient Stated Goal: decrease pain PT Goal Formulation: With patient Time For Goal Achievement: 10/09/19 Potential to Achieve Goals: Good Progress towards PT goals: Progressing toward  goals    Frequency    Min 2X/week      PT Plan Current plan remains appropriate    Co-evaluation              AM-PAC PT "6 Clicks" Mobility   Outcome Measure  Help needed turning from your back to your side while in a flat bed without using bedrails?: A Little Help needed moving from lying on your back to sitting on the side of a flat bed without using bedrails?: A Little Help needed moving to and from a bed to a chair (including a wheelchair)?: A Lot Help needed standing up from a chair using your arms (e.g., wheelchair or bedside chair)?: A Lot Help needed to walk in hospital room?: A Little Help needed climbing 3-5 steps with a railing? : Total 6 Click Score: 14    End of Session Equipment Utilized During Treatment: Gait belt Activity Tolerance: Patient limited by fatigue Patient left: with call bell/phone within reach;in chair;with chair alarm set   PT Visit Diagnosis: Other abnormalities of gait and mobility (R26.89);Pain Pain - Right/Left: Left Pain - part of body: Knee     Time: 5726-2035 PT Time Calculation (min) (ACUTE ONLY): 31 min  Charges:  $Gait Training: 8-22 mins $Therapeutic Activity: 8-22 mins                     Windell Norfolk, DPT, PN1   Supplemental Physical Therapist [place]    Pager [phone] Acute Rehab Office [phone]

## 2019-09-26 LAB — GLUCOSE, CAPILLARY
Glucose-Capillary: 119 mg/dL — ABNORMAL HIGH (ref 70–99)
Glucose-Capillary: 99 mg/dL (ref 70–99)

## 2019-09-26 NOTE — Plan of Care (Signed)
  Problem: Education: Goal: Knowledge of General Education information will improve Description: Including pain rating scale, medication(s)/side effects and non-pharmacologic comfort measures Outcome: Completed/Met   Problem: Education: Goal: Knowledge of General Education information will improve Description: Including pain rating scale, medication(s)/side effects and non-pharmacologic comfort measures Outcome: Completed/Met   Problem: Health Behavior/Discharge Planning: Goal: Ability to manage health-related needs will improve Outcome: Completed/Met   Problem: Clinical Measurements: Goal: Ability to maintain clinical measurements within normal limits will improve Outcome: Adequate for Discharge Goal: Will remain free from infection Outcome: Completed/Met Goal: Diagnostic test results will improve Outcome: Completed/Met Goal: Respiratory complications will improve Outcome: Completed/Met Goal: Cardiovascular complication will be avoided Outcome: Completed/Met   Problem: Activity: Goal: Risk for activity intolerance will decrease Outcome: Adequate for Discharge   Problem: Nutrition: Goal: Adequate nutrition will be maintained Outcome: Adequate for Discharge   Problem: Coping: Goal: Level of anxiety will decrease Outcome: Completed/Met   Problem: Elimination: Goal: Will not experience complications related to bowel motility Outcome: Completed/Met Goal: Will not experience complications related to urinary retention Outcome: Completed/Met   Problem: Pain Managment: Goal: General experience of comfort will improve Outcome: Completed/Met   Problem: Safety: Goal: Ability to remain free from injury will improve Outcome: Completed/Met   Problem: Skin Integrity: Goal: Risk for impaired skin integrity will decrease Outcome: Adequate for Discharge   Problem: Education: Goal: Knowledge of General Education information will improve Description: Including pain rating scale,  medication(s)/side effects and non-pharmacologic comfort measures Outcome: Adequate for Discharge   Problem: Health Behavior/Discharge Planning: Goal: Ability to manage health-related needs will improve Outcome: Adequate for Discharge   Problem: Clinical Measurements: Goal: Ability to maintain clinical measurements within normal limits will improve Outcome: Completed/Met Goal: Will remain free from infection Outcome: Completed/Met Goal: Diagnostic test results will improve Outcome: Adequate for Discharge Goal: Respiratory complications will improve Outcome: Completed/Met Goal: Cardiovascular complication will be avoided Outcome: Completed/Met   Problem: Activity: Goal: Risk for activity intolerance will decrease Outcome: Completed/Met   Problem: Nutrition: Goal: Adequate nutrition will be maintained Outcome: Adequate for Discharge   Problem: Coping: Goal: Level of anxiety will decrease Outcome: Adequate for Discharge   Problem: Elimination: Goal: Will not experience complications related to bowel motility Outcome: Completed/Met Goal: Will not experience complications related to urinary retention Outcome: Completed/Met   Problem: Pain Managment: Goal: General experience of comfort will improve Outcome: Adequate for Discharge   Problem: Safety: Goal: Ability to remain free from injury will improve Outcome: Completed/Met   Problem: Skin Integrity: Goal: Risk for impaired skin integrity will decrease Outcome: Adequate for Discharge   Problem: Education: Goal: Understanding of CV disease, CV risk reduction, and recovery process will improve Outcome: Completed/Met Goal: Individualized Educational Video(s) Outcome: Completed/Met   Problem: Activity: Goal: Ability to return to baseline activity level will improve Outcome: Adequate for Discharge   Problem: Cardiovascular: Goal: Ability to achieve and maintain adequate cardiovascular perfusion will improve Outcome:  Completed/Met Goal: Vascular access site(s) Level 0-1 will be maintained Outcome: Adequate for Discharge   Problem: Health Behavior/Discharge Planning: Goal: Ability to safely manage health-related needs after discharge will improve Outcome: Completed/Met

## 2019-09-26 NOTE — Care Management Important Message (Signed)
Important Message  Patient Details  Name: Alyssa Odonnell MRN: [id number] Date of Birth: September 22, 1934   Medicare Important Message Given:  Yes     [name] 09/26/2019, 1:02 PM

## 2019-09-26 NOTE — Progress Notes (Signed)
Report given to nurse Charlett Nose at [facility]. All questions answered & call back number given. Discharge instructions given to pt & facility. avs on chart for PTAR. IV removed & CCMD notified of pt being d/c'd.  Hoover Brunette, RN

## 2019-09-30 ENCOUNTER — Other Ambulatory Visit: Payer: Self-pay

## 2019-09-30 ENCOUNTER — Emergency Department (HOSPITAL_COMMUNITY): Payer: Medicare Other

## 2019-09-30 ENCOUNTER — Encounter (HOSPITAL_COMMUNITY): Payer: Self-pay

## 2019-09-30 ENCOUNTER — Inpatient Hospital Stay (HOSPITAL_COMMUNITY)
Admission: EM | Admit: 2019-09-30 | Discharge: 2019-10-05 | DRG: 641 | Disposition: A | Discharge status: Skilled Nursing Facility | Payer: Medicare Other | Attending: Internal Medicine | Admitting: Internal Medicine

## 2019-09-30 DIAGNOSIS — L89152 Pressure ulcer of sacral region, stage 2: Secondary | ICD-10-CM | POA: Diagnosis present

## 2019-09-30 DIAGNOSIS — L8990 Pressure ulcer of unspecified site, unspecified stage: Secondary | ICD-10-CM

## 2019-09-30 DIAGNOSIS — H409 Unspecified glaucoma: Secondary | ICD-10-CM | POA: Diagnosis present

## 2019-09-30 DIAGNOSIS — E039 Hypothyroidism, unspecified: Secondary | ICD-10-CM | POA: Diagnosis present

## 2019-09-30 DIAGNOSIS — I129 Hypertensive chronic kidney disease with stage 1 through stage 4 chronic kidney disease, or unspecified chronic kidney disease: Secondary | ICD-10-CM | POA: Diagnosis present

## 2019-09-30 DIAGNOSIS — I739 Peripheral vascular disease, unspecified: Secondary | ICD-10-CM | POA: Diagnosis present

## 2019-09-30 DIAGNOSIS — E46 Unspecified protein-calorie malnutrition: Secondary | ICD-10-CM

## 2019-09-30 DIAGNOSIS — D649 Anemia, unspecified: Secondary | ICD-10-CM | POA: Diagnosis present

## 2019-09-30 DIAGNOSIS — I480 Paroxysmal atrial fibrillation: Secondary | ICD-10-CM | POA: Diagnosis not present

## 2019-09-30 DIAGNOSIS — Z95820 Peripheral vascular angioplasty status with implants and grafts: Secondary | ICD-10-CM

## 2019-09-30 DIAGNOSIS — E785 Hyperlipidemia, unspecified: Secondary | ICD-10-CM | POA: Diagnosis present

## 2019-09-30 DIAGNOSIS — Z7901 Long term (current) use of anticoagulants: Secondary | ICD-10-CM | POA: Diagnosis not present

## 2019-09-30 DIAGNOSIS — Z7902 Long term (current) use of antithrombotics/antiplatelets: Secondary | ICD-10-CM | POA: Diagnosis not present

## 2019-09-30 DIAGNOSIS — K5900 Constipation, unspecified: Secondary | ICD-10-CM | POA: Diagnosis present

## 2019-09-30 DIAGNOSIS — Z931 Gastrostomy status: Secondary | ICD-10-CM | POA: Diagnosis not present

## 2019-09-30 DIAGNOSIS — Z886 Allergy status to analgesic agent status: Secondary | ICD-10-CM

## 2019-09-30 DIAGNOSIS — I313 Pericardial effusion (noninflammatory): Secondary | ICD-10-CM | POA: Diagnosis present

## 2019-09-30 DIAGNOSIS — N1832 Chronic kidney disease, stage 3b: Secondary | ICD-10-CM | POA: Diagnosis present

## 2019-09-30 DIAGNOSIS — J90 Pleural effusion, not elsewhere classified: Secondary | ICD-10-CM | POA: Diagnosis not present

## 2019-09-30 DIAGNOSIS — R627 Adult failure to thrive: Secondary | ICD-10-CM | POA: Diagnosis not present

## 2019-09-30 DIAGNOSIS — Z79890 Hormone replacement therapy (postmenopausal): Secondary | ICD-10-CM

## 2019-09-30 DIAGNOSIS — Z823 Family history of stroke: Secondary | ICD-10-CM

## 2019-09-30 DIAGNOSIS — Z66 Do not resuscitate: Secondary | ICD-10-CM

## 2019-09-30 DIAGNOSIS — Z20828 Contact with and (suspected) exposure to other viral communicable diseases: Secondary | ICD-10-CM | POA: Diagnosis present

## 2019-09-30 DIAGNOSIS — Z884 Allergy status to anesthetic agent status: Secondary | ICD-10-CM

## 2019-09-30 DIAGNOSIS — Z515 Encounter for palliative care: Secondary | ICD-10-CM | POA: Diagnosis not present

## 2019-09-30 DIAGNOSIS — E440 Moderate protein-calorie malnutrition: Secondary | ICD-10-CM | POA: Insufficient documentation

## 2019-09-30 DIAGNOSIS — Z8673 Personal history of transient ischemic attack (TIA), and cerebral infarction without residual deficits: Secondary | ICD-10-CM

## 2019-09-30 DIAGNOSIS — K219 Gastro-esophageal reflux disease without esophagitis: Secondary | ICD-10-CM | POA: Diagnosis present

## 2019-09-30 DIAGNOSIS — Z888 Allergy status to other drugs, medicaments and biological substances status: Secondary | ICD-10-CM

## 2019-09-30 DIAGNOSIS — I509 Heart failure, unspecified: Secondary | ICD-10-CM | POA: Diagnosis not present

## 2019-09-30 DIAGNOSIS — Z79899 Other long term (current) drug therapy: Secondary | ICD-10-CM

## 2019-09-30 DIAGNOSIS — R531 Weakness: Secondary | ICD-10-CM | POA: Diagnosis not present

## 2019-09-30 DIAGNOSIS — Z880 Allergy status to penicillin: Secondary | ICD-10-CM

## 2019-09-30 DIAGNOSIS — R7989 Other specified abnormal findings of blood chemistry: Secondary | ICD-10-CM | POA: Diagnosis not present

## 2019-09-30 LAB — CBC WITH DIFFERENTIAL/PLATELET
Abs Immature Granulocytes: 0.06 10*3/uL (ref 0.00–0.07)
Basophils Absolute: 0 10*3/uL (ref 0.0–0.1)
Basophils Relative: 0 %
Eosinophils Absolute: 0 10*3/uL (ref 0.0–0.5)
Eosinophils Relative: 0 %
HCT: 31.4 % — ABNORMAL LOW (ref 36.0–46.0)
Hemoglobin: 9.6 g/dL — ABNORMAL LOW (ref 12.0–15.0)
Immature Granulocytes: 1 %
Lymphocytes Relative: 17 %
Lymphs Abs: 1.7 10*3/uL (ref 0.7–4.0)
MCH: 28.7 pg (ref 26.0–34.0)
MCHC: 30.6 g/dL (ref 30.0–36.0)
MCV: 94 fL (ref 80.0–100.0)
Monocytes Absolute: 0.7 10*3/uL (ref 0.1–1.0)
Monocytes Relative: 7 %
Neutro Abs: 7.7 10*3/uL (ref 1.7–7.7)
Neutrophils Relative %: 75 %
Platelets: 344 10*3/uL (ref 150–400)
RBC: 3.34 MIL/uL — ABNORMAL LOW (ref 3.87–5.11)
RDW: 14.5 % (ref 11.5–15.5)
WBC: 10.2 10*3/uL (ref 4.0–10.5)
nRBC: 0 % (ref 0.0–0.2)

## 2019-09-30 LAB — COMPREHENSIVE METABOLIC PANEL
ALT: 14 U/L (ref 0–44)
AST: 12 U/L — ABNORMAL LOW (ref 15–41)
Albumin: 2.8 g/dL — ABNORMAL LOW (ref 3.5–5.0)
Alkaline Phosphatase: 73 U/L (ref 38–126)
Anion gap: 13 (ref 5–15)
BUN: 22 mg/dL (ref 8–23)
CO2: 22 mmol/L (ref 22–32)
Calcium: 8.9 mg/dL (ref 8.9–10.3)
Chloride: 102 mmol/L (ref 98–111)
Creatinine, Ser: 1.39 mg/dL — ABNORMAL HIGH (ref 0.44–1.00)
GFR calc Af Amer: 41 mL/min — ABNORMAL LOW (ref >60)
GFR calc non Af Amer: 35 mL/min — ABNORMAL LOW (ref >60)
Glucose, Bld: 103 mg/dL — ABNORMAL HIGH (ref 70–99)
Potassium: 3.7 mmol/L (ref 3.5–5.1)
Sodium: 137 mmol/L (ref 135–145)
Total Bilirubin: 0.9 mg/dL (ref 0.3–1.2)
Total Protein: 6.4 g/dL — ABNORMAL LOW (ref 6.5–8.1)

## 2019-09-30 LAB — BRAIN NATRIURETIC PEPTIDE: B Natriuretic Peptide: 123.1 pg/mL — ABNORMAL HIGH (ref 0.0–100.0)

## 2019-09-30 LAB — TROPONIN I (HIGH SENSITIVITY)
Troponin I (High Sensitivity): 6 ng/L (ref <18)
Troponin I (High Sensitivity): 5 ng/L (ref <18)

## 2019-09-30 LAB — URINALYSIS, ROUTINE W REFLEX MICROSCOPIC
Bacteria, UA: NONE SEEN
Bilirubin Urine: NEGATIVE
Glucose, UA: NEGATIVE mg/dL
Hgb urine dipstick: NEGATIVE
Ketones, ur: NEGATIVE mg/dL
Nitrite: NEGATIVE
Protein, ur: NEGATIVE mg/dL
Specific Gravity, Urine: 1.01 (ref 1.005–1.030)
pH: 6 (ref 5.0–8.0)

## 2019-09-30 LAB — SARS CORONAVIRUS 2 (TAT 6-24 HRS): SARS Coronavirus 2: NEGATIVE

## 2019-09-30 LAB — POC SARS CORONAVIRUS 2 AG -  ED: SARS Coronavirus 2 Ag: NEGATIVE

## 2019-09-30 LAB — D-DIMER, QUANTITATIVE: D-Dimer, Quant: 6.44 ug/mL-FEU — ABNORMAL HIGH (ref 0.00–0.50)

## 2019-09-30 LAB — LACTIC ACID, PLASMA: Lactic Acid, Venous: 1.2 mmol/L (ref 0.5–1.9)

## 2019-09-30 MED ORDER — ACETAMINOPHEN 650 MG RE SUPP: 650.0000 mg | Freq: Four times a day (QID) | RECTAL | Status: DC | PRN

## 2019-09-30 MED ORDER — SODIUM CHLORIDE 0.9% FLUSH
3.0000 mL | Freq: Two times a day (BID) | INTRAVENOUS | Status: DC
  Administered 2019-09-30 – 2019-10-05 (×7): 3 mL via INTRAVENOUS

## 2019-09-30 MED ORDER — LEVOTHYROXINE SODIUM 100 MCG PO TABS
100.0000 ug | ORAL_TABLET | Freq: Every day | ORAL | Status: DC
  Administered 2019-10-01 – 2019-10-05 (×5): 100 ug via ORAL
  Filled 2019-09-30 (×5): qty 1

## 2019-09-30 MED ORDER — ONDANSETRON HCL 4 MG/2ML IJ SOLN
4.0000 mg | Freq: Four times a day (QID) | INTRAMUSCULAR | Status: DC | PRN
  Administered 2019-10-04: 4 mg via INTRAVENOUS
  Filled 2019-09-30: qty 2

## 2019-09-30 MED ORDER — APIXABAN 5 MG PO TABS
5.0000 mg | ORAL_TABLET | Freq: Two times a day (BID) | ORAL | Status: DC
  Administered 2019-09-30 – 2019-10-05 (×10): 5 mg via ORAL
  Filled 2019-09-30 (×11): qty 1

## 2019-09-30 MED ORDER — ACETAMINOPHEN 325 MG PO TABS: 650.0000 mg | ORAL_TABLET | Freq: Four times a day (QID) | ORAL | Status: DC | PRN

## 2019-09-30 MED ORDER — ONDANSETRON HCL 4 MG PO TABS
4.0000 mg | ORAL_TABLET | Freq: Four times a day (QID) | ORAL | Status: DC | PRN
  Filled 2019-09-30: qty 1

## 2019-09-30 MED ORDER — CYCLOBENZAPRINE HCL 5 MG PO TABS
5.0000 mg | ORAL_TABLET | Freq: Three times a day (TID) | ORAL | Status: DC | PRN
  Administered 2019-09-30 – 2019-10-04 (×4): 5 mg via ORAL
  Filled 2019-09-30 (×4): qty 1

## 2019-09-30 MED ORDER — LIDOCAINE 5 % EX PTCH
1.0000 | MEDICATED_PATCH | CUTANEOUS | Status: DC
  Administered 2019-09-30 – 2019-10-03 (×4): 1 via TRANSDERMAL
  Filled 2019-09-30 (×4): qty 1

## 2019-09-30 MED ORDER — FUROSEMIDE 10 MG/ML IJ SOLN
40.0000 mg | Freq: Once | INTRAMUSCULAR | Status: AC
  Administered 2019-09-30: 18:00:00 40 mg via INTRAVENOUS
  Filled 2019-09-30: qty 4

## 2019-09-30 MED ORDER — PANTOPRAZOLE SODIUM 40 MG PO TBEC
40.0000 mg | DELAYED_RELEASE_TABLET | Freq: Every day | ORAL | Status: DC
  Administered 2019-09-30 – 2019-10-05 (×6): 40 mg via ORAL
  Filled 2019-09-30 (×6): qty 1

## 2019-09-30 MED ORDER — LATANOPROST 0.005 % OP SOLN
1.0000 [drp] | Freq: Every day | OPHTHALMIC | Status: DC
  Administered 2019-09-30 – 2019-10-04 (×5): 1 [drp] via OPHTHALMIC
  Filled 2019-09-30 (×3): qty 2.5

## 2019-09-30 MED ORDER — ALBUTEROL SULFATE (2.5 MG/3ML) 0.083% IN NEBU: 2.5000 mg | INHALATION_SOLUTION | Freq: Four times a day (QID) | RESPIRATORY_TRACT | Status: DC | PRN

## 2019-09-30 MED ORDER — AMIODARONE HCL 200 MG PO TABS
200.0000 mg | ORAL_TABLET | Freq: Every day | ORAL | Status: DC
  Administered 2019-09-30 – 2019-10-05 (×6): 200 mg via ORAL
  Filled 2019-09-30 (×6): qty 1

## 2019-09-30 MED ORDER — IOHEXOL 300 MG/ML  SOLN
100.0000 mL | Freq: Once | INTRAMUSCULAR | Status: AC | PRN
  Administered 2019-09-30: 55 mL via INTRAVENOUS

## 2019-09-30 MED ORDER — CLOPIDOGREL BISULFATE 75 MG PO TABS
75.0000 mg | ORAL_TABLET | Freq: Every day | ORAL | Status: DC
  Administered 2019-10-01 – 2019-10-05 (×5): 75 mg via ORAL
  Filled 2019-09-30 (×5): qty 1

## 2019-09-30 NOTE — ED Notes (Signed)
Pt made aware of the need for urine.

## 2019-09-30 NOTE — Progress Notes (Signed)
Received pt alert and oriented x4. Pt denies pain at this time. She just felt really tired per pt. Pt on 2L Oxygen per nasal canula, tolerating well. Pt's vital signs within normal limits. Gtube in place, flushed and dressing changed. Oriented pt to room and plan of care. Safety ensured. Floor mats on the floor. Will monitor pt.

## 2019-09-30 NOTE — ED Notes (Signed)
Blood Culture on hold in mini lab if needed

## 2019-09-30 NOTE — ED Provider Notes (Signed)
[facility] EMERGENCY DEPARTMENT Provider Note   CSN: [id number] Arrival date & time: 09/30/19  1221     History Chief Complaint  Patient presents with  . Weakness    Alyssa Odonnell is a 85 y.o. female with PMHx stroke and PAF currently on Eliquis, HTN, hypothyroidism, recent hiatal hernia repair with gastric outlet obstruction and g tube placement (11/13) and LLE arteriogram and stenting (11/23) who presents to the ED via EMS today for generalized weakness since being discharged from the hospital on 09/26/2019 (admit date: 08/27/2019).   Per chart review patient initially seen in the ED on 11/9 for severe nausea with dry heaving and epigastric pain and found to have a large hiatal hernia with gastric volvulus and gastric outlet obstruction.  Unsuccessful attempts to pass NG tube as well as under fluoroscopy resulted in general surgery performing scopic gastropexy with placement of 20 French gastrostomy tube and extensive adhesion lysis.  Discharge to SNF facility.    She reports that she left the SNF facility in the past day or 2 as she would "rather die at home."  Patient states that they were not taking good care of her there.  She reports that she has not had much of an appetite lately.  She states that no one has been using her G-tube to put any additional supplements in.  Patient states that she lives at home and no one is there to take care of her.  She does report that an individual came out yesterday to assess her needs for future home health but states that no one has come back to see her and she became so weak today that she had a neighbor who is a CNA called EMS instead.  Patient states that her neighbor who is a CNA will sometimes check in on her as well as her brother but otherwise she lives home alone.  She states that she has been able to transfer from her bed and use her walker to help her get around but again she has not had much of an appetite which she thinks is  causing her to feel so weak.  Patient also reports shortness of breath recently.  Patient is currently on Eliquis and states that she has been compliant with her medications although has not taken her usual morning dose of medications today.  Denies fever, chills, abdominal pain, chest pain, nausea, vomiting, diarrhea, any other associated symptoms.   The history is provided by the patient, medical records and the EMS personnel.       Past Medical History:  Diagnosis Date  . Gastric outlet obstruction 08/2019  . Glaucoma   . Hyperlipidemia   . Hypertension   . Hypothyroid   . PAF (paroxysmal atrial fibrillation) ([place])   . Stroke [facility])     Patient Active Problem List   Diagnosis Date Noted  . 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 Gastric outlet obstruction 08/29/2019  . PAF (paroxysmal atrial fibrillation) ([place]) 02/02/2018  . 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[place]) 05/24/2017  . Cerebrovascular accident (CVA) due to embolism of left posterior cerebral artery ([place]) 03/09/2017  . htn 03/09/2017  . Weakness 03/09/2017  . Stroke-like symptom 03/09/2017  . Right sided weakness   . PAD (peripheral artery disease) ([place])   . Essential hypertension   . Spinal stenosis of lumbar region   . Pure hypercholesterolemia   . Recurrent major depressive disorder [facility])     Past Surgical History:  Procedure Laterality Date  .  ABDOMINAL AORTOGRAM W/LOWER EXTREMITY Right 09/11/2019   Procedure: ABDOMINAL AORTOGRAM W/LOWER EXTREMITY;  Surgeon: Waynetta Sandy, MD;  Location: [place] CV LAB;  Service: Cardiovascular;  Laterality: Right;  . ABDOMINAL HERNIA REPAIR    . GASTROSTOMY N/A 09/01/2019   Procedure: INSERTION OF GASTROSTOMY TUBE;  Surgeon: Erroll Luna, MD;  Location: [place];  Service: General;  Laterality: N/A;  . LAPAROSCOPIC LYSIS OF ADHESIONS  09/01/2019   Procedure: Laparoscopic Lysis Of Adhesions;  Surgeon: Erroll Luna, MD;  Location: [place];  Service: General;;  . LAPAROSCOPIC  NISSEN FUNDOPLICATION N/A [id number]   Procedure: LAPAROSCOPIC, POSSIBLE OPEN, GASTROPEXY;  Surgeon: Erroll Luna, MD;  Location: [place];  Service: General;  Laterality: N/A;  . LOOP RECORDER INSERTION N/A 03/11/2017   Procedure: Loop Recorder Insertion;  Surgeon: Evans Lance, MD;  Location: [place] CV LAB;  Service: Cardiovascular;  Laterality: N/A;  . PERIPHERAL VASCULAR BALLOON ANGIOPLASTY Left 09/11/2019   Procedure: PERIPHERAL VASCULAR BALLOON ANGIOPLASTY;  Surgeon: Waynetta Sandy, MD;  Location: [place] CV LAB;  Service: Cardiovascular;  Laterality: Left;  TP trunk  . PERIPHERAL VASCULAR CATHETERIZATION Right 07/06/2016   Procedure: Lower Extremity Angiography;  Surgeon: Angelia Mould, MD;  Location: [place] CV LAB;  Service: Cardiovascular;  Laterality: Right;  . PERIPHERAL VASCULAR CATHETERIZATION Right 07/06/2016   Procedure: Peripheral Vascular Intervention;  Surgeon: Angelia Mould, MD;  Location: [place] CV LAB;  Service: Cardiovascular;  Laterality: Right;  Superficial femoral  . PERIPHERAL VASCULAR INTERVENTION Left 09/11/2019   Procedure: PERIPHERAL VASCULAR INTERVENTION;  Surgeon: Waynetta Sandy, MD;  Location: [place] CV LAB;  Service: Cardiovascular;  Laterality: Left;  SFA  . TEE WITHOUT CARDIOVERSION N/A 03/11/2017   Procedure: TRANSESOPHAGEAL ECHOCARDIOGRAM (TEE);  Surgeon: Acie Fredrickson Wonda Cheng, MD;  Location: [facility] ENDOSCOPY;  Service: Cardiovascular;  Laterality: N/A;  . TOTAL HIP ARTHROPLASTY       OB History   No obstetric history on file.     Family History  Problem Relation Age of Onset  . Stroke Father     Social History   Tobacco Use  . Smoking status: Never Smoker  . Smokeless tobacco: Never Used  Substance Use Topics  . Alcohol use: No  . Drug use: No    Home Medications Prior to Admission medications   Medication Sig Start Date End Date Taking? Authorizing Provider  acetaminophen (TYLENOL) 500  MG tablet Take 1,000 mg by mouth daily as needed for mild pain.     [provider]  amiodarone (PACERONE) 200 MG tablet Take 1 tablet (200 mg total) by mouth daily. 09/26/19 10/26/19  Donne Hazel, MD  clopidogrel (PLAVIX) 75 MG tablet Take 1 tablet (75 mg total) by mouth daily with breakfast. 09/26/19 10/26/19  Donne Hazel, MD  cyclobenzaprine (FLEXERIL) 5 MG tablet Take 1 tablet (5 mg total) by mouth 3 (three) times daily. 09/25/19   Donne Hazel, MD  docusate sodium (COLACE) 100 MG capsule Take 1 capsule (100 mg total) by mouth daily. 09/26/19   Donne Hazel, MD  ELIQUIS 5 MG TABS tablet TAKE 1 TABLET BY MOUTH TWO  TIMES DAILY Patient taking differently: Take 2.5 mg by mouth 2 (two) times daily.  03/14/19   Evans Lance, MD  latanoprost (XALATAN) 0.005 % ophthalmic solution Place 1 drop into both eyes at bedtime. 05/11/16   [provider]  levothyroxine (SYNTHROID, LEVOTHROID) 100 MCG tablet Take 100 mcg by mouth daily before breakfast.  05/11/16  [provider]  lidocaine (LIDODERM) 5 % Place 1 patch onto the skin daily. Remove & Discard patch within 12 hours or as directed by MD 09/25/19   Donne Hazel, MD  methocarbamol (ROBAXIN) 500 MG tablet Take 1 tablet (500 mg total) by mouth every 8 (eight) hours as needed for muscle spasms. 09/25/19   Donne Hazel, MD  omeprazole (PRILOSEC) 20 MG capsule Take 20 mg by mouth daily before breakfast. 08/05/16   [provider]  traMADol (ULTRAM) 50 MG tablet Take 1-2 tablets (50-100 mg total) by mouth every 6 (six) hours as needed for moderate pain or severe pain. 09/25/19   Donne Hazel, MD  triamcinolone cream (KENALOG) 0.1 % Apply 1 application topically daily as needed for itching.    [provider]    Allergies    Aspirin, Atorvastatin, Nabumetone, Nsaids, Pravastatin, Procaine, Rosuvastatin, Simvastatin, Statins, Evolocumab, Mobic [meloxicam], and Penicillins  Review of Systems   Review  of Systems  Constitutional: Positive for appetite change and fatigue. Negative for chills and fever.  Neurological: Positive for weakness (generalized).  All other systems reviewed and are negative.   Physical Exam Updated Vital Signs BP 131/76   Pulse 96   Temp 98.6 F (37 C) (Oral)   Resp 19   Ht 5\' 3"  (1.6 m)   SpO2 94%   BMI 28.66 kg/m   Physical Exam Vitals and nursing note reviewed.  Constitutional:      Appearance: She is not ill-appearing or diaphoretic.  HENT:     Head: Normocephalic and atraumatic.  Eyes:     Conjunctiva/sclera: Conjunctivae normal.  Cardiovascular:     Rate and Rhythm: Normal rate and regular rhythm.  Pulmonary:     Effort: Pulmonary effort is normal.     Breath sounds: Normal breath sounds. No wheezing, rhonchi or rales.     Comments: Slight increase work of breathing. Oxygen saturation fluctuates anywhere from 91% - 97%. Have placed on 2 L O2. LCTAB. Abdominal:     Palpations: Abdomen is soft.     Tenderness: There is no abdominal tenderness. There is no guarding or rebound.     Comments: G tube in place  Musculoskeletal:     Cervical back: Neck supple.  Skin:    General: Skin is warm and dry.  Neurological:     Mental Status: She is alert.     ED Results / Procedures / Treatments   Labs (all labs ordered are listed, but only abnormal results are displayed) Labs Reviewed  COMPREHENSIVE METABOLIC PANEL - Abnormal; Notable for the following components:      Result Value   Glucose, Bld 103 (*)    Creatinine, Ser 1.39 (*)    Total Protein 6.4 (*)    Albumin 2.8 (*)    AST 12 (*)    GFR calc non Af Amer 35 (*)    GFR calc Af Amer 41 (*)    All other components within normal limits  CBC WITH DIFFERENTIAL/PLATELET - Abnormal; Notable for the following components:   RBC 3.34 (*)    Hemoglobin 9.6 (*)    HCT 31.4 (*)    All other components within normal limits  D-DIMER, QUANTITATIVE (NOT AT [facility]) - Abnormal; Notable for the  following components:   D-Dimer, Quant 6.44 (*)    All other components within normal limits  SARS CORONAVIRUS 2 (TAT 6-24 HRS)  LACTIC ACID, PLASMA  URINALYSIS, ROUTINE W REFLEX MICROSCOPIC  POC SARS CORONAVIRUS  2 AG -  ED  TROPONIN I (HIGH SENSITIVITY)  TROPONIN I (HIGH SENSITIVITY)    EKG EKG Interpretation  Date/Time:  Saturday September 30 2019 12:57:06 EST Ventricular Rate:  95 PR Interval:    QRS Duration: 48 QT Interval:  424 QTC Calculation: 534 R Axis:   54 Text Interpretation: Sinus rhythm Borderline low voltage, extremity leads Nonspecific T abnrm, anterolateral leads Prolonged QT interval Confirmed by Isla Pence [phone]) on 09/30/2019 1:12:42 PM   Radiology DG Chest 2 View  Result Date: 09/30/2019 CLINICAL DATA:  Shortness of breath EXAM: CHEST - 2 VIEW COMPARISON:  08/29/2019 FINDINGS: Heart size is mildly enlarged, stable. No pulmonary vascular congestion. Moderate bilateral pleural effusions with associated bibasilar opacities. No pneumothorax. Degenerative changes of the shoulders. IMPRESSION: Moderate bilateral pleural effusions with associated bibasilar opacities, likely atelectasis. Electronically Signed   By: Davina Poke M.D.   On: 09/30/2019 16:10   CT Angio Chest PE W/Cm &/Or Wo Cm  Result Date: 09/30/2019 CLINICAL DATA:  Positive D-dimer. EXAM: CT ANGIOGRAPHY CHEST WITH CONTRAST TECHNIQUE: Multidetector CT imaging of the chest was performed using the standard protocol during bolus administration of intravenous contrast. Multiplanar CT image reconstructions and MIPs were obtained to evaluate the vascular anatomy. CONTRAST:  74mL OMNIPAQUE IOHEXOL 300 MG/ML  SOLN COMPARISON:  August 03, 2016. FINDINGS: Cardiovascular: Satisfactory opacification of the pulmonary arteries to the segmental level. No evidence of pulmonary embolism. Normal heart size. Mild pericardial effusion is noted. Coronary artery calcifications noted. Atherosclerosis of thoracic aorta  is noted without aneurysm formation. Mediastinum/Nodes: Thyroid gland is unremarkable. No adenopathy is noted. Large sliding-type hiatal hernia is noted. Lungs/Pleura: No pneumothorax is noted. Moderate bilateral pleural effusions are noted, left greater than right, with associated atelectasis of both lower lobes. Upper Abdomen: No acute abnormality. Musculoskeletal: No chest wall abnormality. No acute or significant osseous findings. Review of the MIP images confirms the above findings. IMPRESSION: 1. No definite evidence of pulmonary embolus. 2. Coronary artery calcifications are noted suggesting coronary artery disease. 3. Large sliding-type hiatal hernia is noted. 4. Moderate bilateral pleural effusions are noted, left greater than right, with associated atelectasis of both lower lobes. 5. Aortic atherosclerosis. Aortic Atherosclerosis (ICD10-I70.0). Electronically Signed   By: Marijo Conception M.D.   On: 09/30/2019 16:06    Procedures Procedures (including critical care time)  Medications Ordered in ED Medications  iohexol (OMNIPAQUE) 300 MG/ML solution 100 mL (55 mLs Intravenous Contrast Given 09/30/19 1549)    ED Course  I have reviewed the triage vital signs and the nursing notes.  Pertinent labs & imaging results that were available during my care of the patient were reviewed by me and considered in my medical decision making (see chart for details).  85 year old female who presents to the ED today after calling EMS for generalized weakness, loss of appetite. Recently discharged from the hospital on 12/7 and placed in SNF although patient does endorse that she left on her own accord as they were not taking good care of her. She currently lives alone does not appear that she has much support at home to help after her month-long hospitalization. Does not complain of any pain currently. Does report that she has felt more short of breath recently. She does appear to be working slightly harder to  breathe and is slightly tachypneic. Have placed on 2 L oxygen. Will obtain screening labs at this time including troponin and D-dimer. Patient is currently on Eliquis but given recent hospitalization, shortness of breath,  placing her on 2 L feel that it is necessary to rule out PE. No obvious findings on lab work will consult social work at this time is patient does not need to be living at home without any support with recent discharge from hospital and plan for SNF. She does currently have a G-tube in place but it does not appear that anyone has been using it for supplemental nutrients. She did see a dietitian in the hospital who recommended boost shakes.   DC without leukocytosis today. Hemoglobin stable and improved from recent discharge from hospital. Lactic acid within normal limits at 1.2. Initial troponin of 6. Will repeat. CMP with mild elevation in creatinine 1.39, most recent from 5 days ago 1.20. Significantly elevated at 6.44. Will obtain CTA at this time. Rapid Covid swab negative. Will repeat swab. Plan for admission at this time.   At shift change case signed out to Nuala Alpha, [place], who will dispo patient accordingly. Feel patient would benefit from readmission for failure to thrive if no acute findings on CTA but may need social work involvement.   Clinical Course as of Sep 29 1614  Sat Sep 30, 2019  1557 CT then admit   [BM]    Clinical Course User Index [BM] Gari Crown   MDM Rules/Calculators/A&P     CHA2DS2/VAS Stroke Risk Points  Current as of 10 minutes ago     6 >= 2 Points: High Risk  1 - 1.99 Points: Medium Risk  0 Points: Low Risk    This is the only CHA2DS2/VAS Stroke Risk Points available for the past  year.: Last Change: N/A     Details    This score determines the patient's risk of having a stroke if the  patient has atrial fibrillation.       Points Metrics  0 Has Congestive Heart Failure:  No    Current as of 10 minutes ago  0 Has  Vascular Disease:  No    Current as of 10 minutes ago  1 Has Hypertension:  Yes    Current as of 10 minutes ago  2 Age:  53    Current as of 10 minutes ago  0 Has Diabetes:  No    Current as of 10 minutes ago  2 Had Stroke:  Yes  Had TIA:  No  Had thromboembolism:  Yes    Current as of 10 minutes ago  1 Female:  Yes    Current as of 10 minutes ago                          Final Clinical Impression(s) / ED Diagnoses Final diagnoses:  Generalized weakness    Rx / DC Orders ED Discharge Orders    None       Eustaquio Maize, PA-C 09/30/19 1616    Isla Pence, MD 10/04/19 1529

## 2019-09-30 NOTE — ED Provider Notes (Signed)
Care handoff received from [facility] PA-C at shift change please see her note for full details.  Patient presented today for generalized weakness, she has had recent hiatal hernia repair, G-tube, LLE arteriogram and stenting.  She is admitted to this hospital 4 days ago recently discharged.  Appears she was sent to a skilled nursing facility but left to return home.  Since returning home she has not had any assistance, she has not had any p.o. intake and has been increasingly weak.  Majority of lab work overall reassuring however she had an elevated D-dimer.  CT angiogram PE study pending at shift change.  Plan of care is to await CT angio results and then admit to hospitalist. Physical Exam  BP 132/73   Pulse 94   Temp 98.6 F (37 C) (Oral)   Resp 19   Ht 5\' 3"  (1.6 m)   SpO2 98%   BMI 28.66 kg/m   Physical Exam Constitutional:      General: She is not in acute distress.    Appearance: Normal appearance. She is well-developed. She is not ill-appearing or diaphoretic.  HENT:     Head: Normocephalic and atraumatic.     Right Ear: External ear normal.     Left Ear: External ear normal.     Nose: Nose normal.  Eyes:     General: Vision grossly intact. Gaze aligned appropriately.     Pupils: Pupils are equal, round, and reactive to light.  Neck:     Trachea: Trachea and phonation normal. No tracheal deviation.  Pulmonary:     Effort: Pulmonary effort is normal. No respiratory distress.  Musculoskeletal:        General: Normal range of motion.     Cervical back: Normal range of motion.  Skin:    General: Skin is warm and dry.  Neurological:     Mental Status: She is alert.     GCS: GCS eye subscore is 4. GCS verbal subscore is 5. GCS motor subscore is 6.     Comments: Speech is clear and goal oriented, follows commands Major Cranial nerves without deficit, no facial droop Moves extremities without ataxia, coordination intact  Psychiatric:        Behavior: Behavior normal.      ED Course/Procedures   Clinical Course as of Sep 30 1647  Sat Sep 30, 2019  1557 CT then admit   [BM]  1646 Dr. Tamala Julian   [BM]    Clinical Course User Index [BM] Deliah Boston, PA-C    Procedures  MDM  CT Angio PE:    IMPRESSION:  1. No definite evidence of pulmonary embolus.  2. Coronary artery calcifications are noted suggesting coronary  artery disease.  3. Large sliding-type hiatal hernia is noted.  4. Moderate bilateral pleural effusions are noted, left greater than  right, with associated atelectasis of both lower lobes.  5. Aortic atherosclerosis.    Aortic Atherosclerosis (ICD10-I70.0).   CXR:  IMPRESSION:  Moderate bilateral pleural effusions with associated bibasilar  opacities, likely atelectasis.  - Patient reassessed sitting comfortably in bed no acute distress.  She states understanding of care plan and is agreeable for mission. - Discussed case with Dr. Tamala Julian from hospitalist service will be seeing patient for admission. - Note: Portions of this report may have been transcribed using voice recognition software. Every effort was made to ensure accuracy; however, inadvertent computerized transcription errors may still be present.     Deliah Boston, [place] 09/30/19 1649  Tegeler, Gwenyth Allegra, MD 09/30/19 [phone]

## 2019-09-30 NOTE — ED Notes (Signed)
Report attempted 

## 2019-09-30 NOTE — ED Triage Notes (Signed)
[facility] reports pt had hernia repair about a month ago. She has had decreased mobility and appetite. She lives at home.

## 2019-09-30 NOTE — ED Notes (Signed)
Patient transported to CT 

## 2019-09-30 NOTE — ED Notes (Signed)
Assisted pt in calling [facility] hosp to get an update on her dtr.

## 2019-09-30 NOTE — ED Notes (Signed)
Pure Wick was placed on patient. 

## 2019-09-30 NOTE — H&P (Addendum)
History and Physical    Alyssa Odonnell [id number] DOB: Mar 16, 1934 DOA: 09/30/2019  Referring MD/NP/PA: Nuala Alpha, PA-C PCP: Drake Leach, MD  Patient coming from: Home Via EMS  Chief Complaint: Can't take care of myself at home  I have personally briefly reviewed patient's old medical records in [place]   HPI: Alyssa Odonnell is a 85 y.o. female with medical history significant of hypertension, hyperlipidemia, hiatal hernia with gastric outlet obstruction, PAF on Eliquis, and CVA.  Patient presents with reports of being unable to care for herself. She was recently hospitalized from 11/9-12/8 after having a large hiatal hernia with gastric outlet obstruction and volvulus.  Patient had a prolonged hospitalization requiring laparoscopic gastric plexi on 09/01/2019 with placement of gastrostomy tube and lysis of adhesions.  Subsequently, she developed acute ischemic left fourth and fifth toes requiring arteriogram and stent to the left SFA and balloon angioplasty of the trunk on 11/23 by vascular surgery.  Following the procedure patient had a groin hematoma requiring transfusion of 2 units of packed red blood cells.  She was initially sent to a skilled nursing facility, but reports that staff were rude and were not giving adequate care.  Therefore patient left after being there when they came home.  She lives at home alone and was utilizing a walker to ambulate.  After getting home she was told that people would come out to help and assist her, but no one ever came.  She complains of being constantly nauseous and has significant shortness of breath with exertion.  Leaning to either side also worsens shortness of breath symptoms.  She reports that she has been nauseous, and only able to eat soup.  ED Course: Upon admission into the emergency department patient was seen to be tachycardic and mildly tachypneic, but all other vital signs stable.  She appears to have been placed on 2 L  nasal cannula oxygen for comfort, but was never noted to be hypoxic.  Labs significant for WBC 10.2, hemoglobin 9.6(7.7 on 12/7), BUN 22, creatinine 1.39, and D-dimer 6.44.  CT angiogram of the chest revealed bilateral lower effusions left greater than right with associated atelectasis, coronary calcifications concerning for coronary artery disease, and no signs of a pulmonary embolus.  TRH called to admit as patient unable to care for self at home.  Review of Systems  Constitutional: Positive for malaise/fatigue. Negative for fever.  Eyes: Negative for double vision and photophobia.  Respiratory: Positive for shortness of breath. Negative for cough.   Cardiovascular: Negative for chest pain and leg swelling.  Gastrointestinal: Positive for nausea. Negative for abdominal pain.  Genitourinary: Negative for dysuria and hematuria.  Musculoskeletal: Negative for falls.  Neurological: Positive for weakness.  Psychiatric/Behavioral: Negative for memory loss and substance abuse. The patient has insomnia.     Past Medical History:  Diagnosis Date  . Gastric outlet obstruction 08/2019  . Glaucoma   . Hyperlipidemia   . Hypertension   . Hypothyroid   . PAF (paroxysmal atrial fibrillation) ([place])   . Stroke [facility])     Past Surgical History:  Procedure Laterality Date  . ABDOMINAL AORTOGRAM W/LOWER EXTREMITY Right 09/11/2019   Procedure: ABDOMINAL AORTOGRAM W/LOWER EXTREMITY;  Surgeon: Waynetta Sandy, MD;  Location: [place] [facility];  Service: Cardiovascular;  Laterality: Right;  . ABDOMINAL HERNIA REPAIR    . GASTROSTOMY N/A 09/01/2019   Procedure: INSERTION OF GASTROSTOMY TUBE;  Surgeon: Erroll Luna, MD;  Location: [place];  Service: General;  Laterality: N/A;  . LAPAROSCOPIC LYSIS OF ADHESIONS  09/01/2019   Procedure: Laparoscopic Lysis Of Adhesions;  Surgeon: Erroll Luna, MD;  Location: [place];  Service: General;;  . LAPAROSCOPIC NISSEN FUNDOPLICATION N/A [id number]    Procedure: LAPAROSCOPIC, POSSIBLE OPEN, GASTROPEXY;  Surgeon: Erroll Luna, MD;  Location: [place];  Service: General;  Laterality: N/A;  . LOOP RECORDER INSERTION N/A 03/11/2017   Procedure: Loop Recorder Insertion;  Surgeon: Evans Lance, MD;  Location: [place] [facility];  Service: Cardiovascular;  Laterality: N/A;  . PERIPHERAL VASCULAR BALLOON ANGIOPLASTY Left 09/11/2019   Procedure: PERIPHERAL VASCULAR BALLOON ANGIOPLASTY;  Surgeon: Waynetta Sandy, MD;  Location: [place] [facility];  Service: Cardiovascular;  Laterality: Left;  TP trunk  . PERIPHERAL VASCULAR CATHETERIZATION Right 07/06/2016   Procedure: Lower Extremity Angiography;  Surgeon: Angelia Mould, MD;  Location: [place] [facility];  Service: Cardiovascular;  Laterality: Right;  . PERIPHERAL VASCULAR CATHETERIZATION Right 07/06/2016   Procedure: Peripheral Vascular Intervention;  Surgeon: Angelia Mould, MD;  Location: [place] [facility];  Service: Cardiovascular;  Laterality: Right;  Superficial femoral  . PERIPHERAL VASCULAR INTERVENTION Left 09/11/2019   Procedure: PERIPHERAL VASCULAR INTERVENTION;  Surgeon: Waynetta Sandy, MD;  Location: [place] [facility];  Service: Cardiovascular;  Laterality: Left;  SFA  . TEE WITHOUT CARDIOVERSION N/A 03/11/2017   Procedure: TRANSESOPHAGEAL ECHOCARDIOGRAM (TEE);  Surgeon: Acie Fredrickson Wonda Cheng, MD;  Location: [facility];  Service: Cardiovascular;  Laterality: N/A;  . TOTAL HIP ARTHROPLASTY       reports that she has never smoked. She has never used smokeless tobacco. She reports that she does not drink alcohol or use drugs.  Allergies  Allergen Reactions  . Aspirin Other (See Comments)    Red splotches  . Atorvastatin Other (See Comments)    Muscle spams  . Nabumetone Itching  . Nsaids     Reaction (??)  . Pravastatin Other (See Comments)    Stiff walking difficulty Stiff walking difficulty  . Procaine Swelling  . Rosuvastatin Nausea Only and  Other (See Comments)    Reflux and Muscle stiffness  . Simvastatin Nausea Only and Other (See Comments)    Reflux and Muscle stiffness  . Statins Other (See Comments)  . Evolocumab Rash  . Mobic [Meloxicam] Rash  . Penicillins Rash    Has patient had a PCN reaction causing immediate rash, facial/tongue/throat swelling, SOB or lightheadedness with hypotension: Yes Has patient had a PCN reaction causing severe rash involving mucus membranes or skin necrosis: No Has patient had a PCN reaction that required hospitalization: No Has patient had a PCN reaction occurring within the last 10 years: No If all of the above answers are "NO", then may proceed with Cephalosporin use.      Family History  Problem Relation Age of Onset  . Stroke Father     Prior to Admission medications   Medication Sig Start Date End Date Taking? Authorizing Provider  acetaminophen (TYLENOL) 500 MG tablet Take 1,000 mg by mouth daily as needed for mild pain.     [provider]  amiodarone (PACERONE) 200 MG tablet Take 1 tablet (200 mg total) by mouth daily. 09/26/19 10/26/19  Donne Hazel, MD  clopidogrel (PLAVIX) 75 MG tablet Take 1 tablet (75 mg total) by mouth daily with breakfast. 09/26/19 10/26/19  Donne Hazel, MD  cyclobenzaprine (FLEXERIL) 5 MG tablet Take 1 tablet (5 mg total) by mouth 3 (three) times daily. 09/25/19   Marylu Lund  K, MD  docusate sodium (COLACE) 100 MG capsule Take 1 capsule (100 mg total) by mouth daily. 09/26/19   Donne Hazel, MD  ELIQUIS 5 MG TABS tablet TAKE 1 TABLET BY MOUTH TWO  TIMES DAILY Patient taking differently: Take 2.5 mg by mouth 2 (two) times daily.  03/14/19   Evans Lance, MD  latanoprost (XALATAN) 0.005 % ophthalmic solution Place 1 drop into both eyes at bedtime. 05/11/16   [provider]  levothyroxine (SYNTHROID, LEVOTHROID) 100 MCG tablet Take 100 mcg by mouth daily before breakfast.  05/11/16   [provider]  lidocaine (LIDODERM) 5  % Place 1 patch onto the skin daily. Remove & Discard patch within 12 hours or as directed by MD 09/25/19   Donne Hazel, MD  methocarbamol (ROBAXIN) 500 MG tablet Take 1 tablet (500 mg total) by mouth every 8 (eight) hours as needed for muscle spasms. 09/25/19   Donne Hazel, MD  omeprazole (PRILOSEC) 20 MG capsule Take 20 mg by mouth daily before breakfast. 08/05/16   [provider]  traMADol (ULTRAM) 50 MG tablet Take 1-2 tablets (50-100 mg total) by mouth every 6 (six) hours as needed for moderate pain or severe pain. 09/25/19   Donne Hazel, MD  triamcinolone cream (KENALOG) 0.1 % Apply 1 application topically daily as needed for itching.    [provider]    Physical Exam:  Constitutional: Elderly female who appears to be in some distress Vitals:   09/30/19 1315 09/30/19 1320 09/30/19 1500 09/30/19 1505  BP: 136/71  132/73   Pulse: 94  94   Resp:  (!) 22  19  Temp:      TempSrc:      SpO2: 100%  98%   Height:       Eyes: PERRL, lids and conjunctivae normal ENMT: Mucous membranes are dry. Posterior pharynx clear of any exudate or lesions. Poor dentition.  Neck: normal, supple, no masses, no thyromegaly Respiratory: Decreased overall aeration with positive crackles heard in the mid lung fields.  O2 saturations currently maintained with nasal cannula oxygen off.  Able to talk in complete sentences. Cardiovascular: Regular rate and rhythm, no murmurs / rubs / gallops. No extremity edema. 2+ pedal pulses. No carotid bruits.  Abdomen: no tenderness, no masses palpated. No hepatosplenomegaly. Bowel sounds positive.  Musculoskeletal: no clubbing / cyanosis. No joint deformity upper and lower extremities. Good ROM, no contractures. Normal muscle tone.  Skin: no rashes, lesions, ulcers. No induration Neurologic: CN 2-12 grossly intact. Sensation intact, DTR normal. Strength 5/5 in all 4.  Psychiatric: Normal judgment and insight. Alert and oriented x 3. Normal mood.      Labs on Admission: I have personally reviewed following labs and imaging studies  CBC: Recent Labs  Lab 09/24/19 0432 09/25/19 0357 09/30/19 1328  WBC 7.7 7.6 10.2  NEUTROABS  --   --  7.7  HGB 7.3* 7.7* 9.6*  HCT 23.1* 24.8* 31.4*  MCV 91.3 91.9 94.0  PLT 335 359 [id number]   Basic Metabolic Panel: Recent Labs  Lab 09/24/19 0432 09/25/19 0357 09/30/19 1328  NA 135 137 137  K 4.2 4.1 3.7  CL 97* 99 102  CO2 28 28 22   GLUCOSE 107* 113* 103*  BUN 18 16 22   CREATININE 1.11* 1.20* 1.39*  CALCIUM 8.4* 8.6* 8.9   GFR: Estimated Creatinine Clearance: 29.4 mL/min (A) (by C-G formula based on SCr of 1.39 mg/dL (H)). Liver Function Tests: Recent Labs  Lab 09/25/19 0357 09/30/19 1328  AST 13* 12*  ALT 14 14  ALKPHOS 80 73  BILITOT 0.3 0.9  PROT 5.3* 6.4*  ALBUMIN 2.1* 2.8*   No results for input(s): LIPASE, AMYLASE in the last 168 hours. No results for input(s): AMMONIA in the last 168 hours. Coagulation Profile: No results for input(s): INR, PROTIME in the last 168 hours. Cardiac Enzymes: No results for input(s): CKTOTAL, CKMB, CKMBINDEX, TROPONINI in the last 168 hours. BNP (last 3 results) No results for input(s): PROBNP in the last 8760 hours. HbA1C: No results for input(s): HGBA1C in the last 72 hours. CBG: Recent Labs  Lab 09/25/19 0003 09/25/19 0743 09/25/19 1652 09/26/19 0000 09/26/19 0855  GLUCAP 109* 100* 128* 119* 99   Lipid Profile: No results for input(s): CHOL, HDL, LDLCALC, TRIG, CHOLHDL, LDLDIRECT in the last 72 hours. Thyroid Function Tests: No results for input(s): TSH, T4TOTAL, FREET4, T3FREE, THYROIDAB in the last 72 hours. Anemia Panel: No results for input(s): VITAMINB12, FOLATE, FERRITIN, TIBC, IRON, RETICCTPCT in the last 72 hours. Urine analysis: No results found for: COLORURINE, APPEARANCEUR, LABSPEC, PHURINE, GLUCOSEU, HGBUR, BILIRUBINUR, KETONESUR, PROTEINUR, UROBILINOGEN, NITRITE, LEUKOCYTESUR Sepsis Labs: Recent Results  (from the past 240 hour(s))  SARS CORONAVIRUS 2 (TAT 6-24 HRS) Nasopharyngeal Nasopharyngeal Swab     Status: None   Collection Time: 09/21/19  2:30 PM   Specimen: Nasopharyngeal Swab  Result Value Ref Range Status   SARS Coronavirus 2 NEGATIVE NEGATIVE Final    Comment: (NOTE) SARS-CoV-2 target nucleic acids are NOT DETECTED. The SARS-CoV-2 RNA is generally detectable in upper and lower respiratory specimens during the acute phase of infection. Negative results do not preclude SARS-CoV-2 infection, do not rule out co-infections with other pathogens, and should not be used as the sole basis for treatment or other patient management decisions. Negative results must be combined with clinical observations, patient history, and epidemiological information. The expected result is Negative. Fact Sheet for Patients: SugarRoll.be Fact Sheet for Healthcare Providers: https://www.woods-mathews.com/ This test is not yet approved or cleared by the [place] FDA and  has been authorized for detection and/or diagnosis of SARS-CoV-2 by FDA under an Emergency Use Authorization (EUA). This EUA will remain  in effect (meaning this test can be used) for the duration of the COVID-19 declaration under Section 56 4(b)(1) of the Act, 21 U.S.C. section 360bbb-3(b)(1), unless the authorization is terminated or revoked sooner. Performed at [facility], [place] [address] [place], [place] [address]   SARS CORONAVIRUS 2 (TAT 6-24 HRS) Nasopharyngeal Nasopharyngeal Swab     Status: None   Collection Time: 09/25/19  5:20 PM   Specimen: Nasopharyngeal Swab  Result Value Ref Range Status   SARS Coronavirus 2 NEGATIVE NEGATIVE Final    Comment: (NOTE) SARS-CoV-2 target nucleic acids are NOT DETECTED. The SARS-CoV-2 RNA is generally detectable in upper and lower respiratory specimens during the acute phase of infection. Negative results do not preclude SARS-CoV-2  infection, do not rule out co-infections with other pathogens, and should not be used as the sole basis for treatment or other patient management decisions. Negative results must be combined with clinical observations, patient history, and epidemiological information. The expected result is Negative. Fact Sheet for Patients: SugarRoll.be Fact Sheet for Healthcare Providers: https://www.woods-mathews.com/ This test is not yet approved or cleared by the [place] FDA and  has been authorized for detection and/or diagnosis of SARS-CoV-2 by FDA under an Emergency Use Authorization (EUA). This EUA will remain  in effect (meaning  this test can be used) for the duration of the COVID-19 declaration under Section 56 4(b)(1) of the Act, 21 U.S.C. section 360bbb-3(b)(1), unless the authorization is terminated or revoked sooner. Performed at [facility], [place] [address], [place], [place] [address]      Radiological Exams on Admission: DG Chest 2 View  Result Date: 09/30/2019 CLINICAL DATA:  Shortness of breath EXAM: CHEST - 2 VIEW COMPARISON:  08/29/2019 FINDINGS: Heart size is mildly enlarged, stable. No pulmonary vascular congestion. Moderate bilateral pleural effusions with associated bibasilar opacities. No pneumothorax. Degenerative changes of the shoulders. IMPRESSION: Moderate bilateral pleural effusions with associated bibasilar opacities, likely atelectasis. Electronically Signed   By: Davina Poke M.D.   On: 09/30/2019 16:10   CT Angio Chest PE W/Cm &/Or Wo Cm  Result Date: 09/30/2019 CLINICAL DATA:  Positive D-dimer. EXAM: CT ANGIOGRAPHY CHEST WITH CONTRAST TECHNIQUE: Multidetector CT imaging of the chest was performed using the standard protocol during bolus administration of intravenous contrast. Multiplanar CT image reconstructions and MIPs were obtained to evaluate the vascular anatomy. CONTRAST:  58mL OMNIPAQUE IOHEXOL 300 MG/ML   SOLN COMPARISON:  August 03, 2016. FINDINGS: Cardiovascular: Satisfactory opacification of the pulmonary arteries to the segmental level. No evidence of pulmonary embolism. Normal heart size. Mild pericardial effusion is noted. Coronary artery calcifications noted. Atherosclerosis of thoracic aorta is noted without aneurysm formation. Mediastinum/Nodes: Thyroid gland is unremarkable. No adenopathy is noted. Large sliding-type hiatal hernia is noted. Lungs/Pleura: No pneumothorax is noted. Moderate bilateral pleural effusions are noted, left greater than right, with associated atelectasis of both lower lobes. Upper Abdomen: No acute abnormality. Musculoskeletal: No chest wall abnormality. No acute or significant osseous findings. Review of the MIP images confirms the above findings. IMPRESSION: 1. No definite evidence of pulmonary embolus. 2. Coronary artery calcifications are noted suggesting coronary artery disease. 3. Large sliding-type hiatal hernia is noted. 4. Moderate bilateral pleural effusions are noted, left greater than right, with associated atelectasis of both lower lobes. 5. Aortic atherosclerosis. Aortic Atherosclerosis (ICD10-I70.0). Electronically Signed   By: Marijo Conception M.D.   On: 09/30/2019 16:06    EKG: Independently reviewed.  Sinus rhythm at 95 bpm with QTc prolonged at 534  Assessment/Plan Failure to thrive, generalized weakness: Patient reports that after leaving the skilled nursing facility due to poor care there and getting home she was unable to care for self.  She reports not receiving help like she initially thought. -Admit to a medical telemetry bed -PT/OT to eval and treat -Social work consulted  Dyspnea secondary to bilateral pleural effusions: Acute.  CT angiogram of the chest noted moderate bilateral pleural effusions left greater than the right. -Check BNP -Lasix 40 mg IV x1 dose now -Check echocardiogram -May warrant thoracentesis in a.m.  Malnutrition:  Albumin 2.8 on admission.  Patient had not been receiving tube feeds although G-tube in place. -Check prealbumin -Nutrition consult  Elevated D-dimer: Acute.  D-dimer elevated at 6.44, but CT angiogram negative for signs of a pulmonary embolus.  Large hiatal hernia, status post PEG: Status post laparoscopic gastric plexi with lysis of adhesions after patient developed volvulus.  She has a PEG in place but it has not been utilized for tube feeds. -Consider starting tube feeds  Paroxysmal atrial fibrillation on chronic anticoagulation: Patient currently in sinus rhythm.  Reports taking Eliquis as prescribed. -Continue Eliquis and amiodarone  Peripheral vascular disease: Patient just recently had been found with acute ischemic left fourth and fifth toes.  Status post stenting  of the left SFA on 11/23. -Continue Plavix  Hypothyroidism:Uncontrolled. TSH elevated at 7.37 on 11/19. -Recheck TSH to ensure levels are going in the right direction -Continue levothyroxine   GERD -Continue pharmacy substitution for omeprazole  DVT prophylaxis: Eliquis Code Status: Full Family Communication: No family present at bedside Disposition Plan: To be determined but likely skilled nursing facility Consults called: Social work Admission status: Inpatient   Norval Morton MD [facility] Pager [phone]   If 7PM-7AM, please contact night-coverage [email or web address] Password [facility]  09/30/2019, 4:47 PM

## 2019-10-01 ENCOUNTER — Inpatient Hospital Stay (HOSPITAL_COMMUNITY): Payer: Medicare Other

## 2019-10-01 DIAGNOSIS — J90 Pleural effusion, not elsewhere classified: Secondary | ICD-10-CM

## 2019-10-01 DIAGNOSIS — I509 Heart failure, unspecified: Secondary | ICD-10-CM

## 2019-10-01 DIAGNOSIS — E46 Unspecified protein-calorie malnutrition: Secondary | ICD-10-CM | POA: Diagnosis present

## 2019-10-01 DIAGNOSIS — L8990 Pressure ulcer of unspecified site, unspecified stage: Secondary | ICD-10-CM | POA: Diagnosis present

## 2019-10-01 DIAGNOSIS — E039 Hypothyroidism, unspecified: Secondary | ICD-10-CM | POA: Diagnosis present

## 2019-10-01 LAB — BASIC METABOLIC PANEL
Anion gap: 14 (ref 5–15)
BUN: 19 mg/dL (ref 8–23)
CO2: 25 mmol/L (ref 22–32)
Calcium: 8.7 mg/dL — ABNORMAL LOW (ref 8.9–10.3)
Chloride: 99 mmol/L (ref 98–111)
Creatinine, Ser: 1.34 mg/dL — ABNORMAL HIGH (ref 0.44–1.00)
GFR calc Af Amer: 42 mL/min — ABNORMAL LOW (ref >60)
GFR calc non Af Amer: 37 mL/min — ABNORMAL LOW (ref >60)
Glucose, Bld: 92 mg/dL (ref 70–99)
Potassium: 3.3 mmol/L — ABNORMAL LOW (ref 3.5–5.1)
Sodium: 138 mmol/L (ref 135–145)

## 2019-10-01 LAB — CBC
HCT: 28 % — ABNORMAL LOW (ref 36.0–46.0)
Hemoglobin: 8.7 g/dL — ABNORMAL LOW (ref 12.0–15.0)
MCH: 28.1 pg (ref 26.0–34.0)
MCHC: 31.1 g/dL (ref 30.0–36.0)
MCV: 90.3 fL (ref 80.0–100.0)
Platelets: 325 10*3/uL (ref 150–400)
RBC: 3.1 MIL/uL — ABNORMAL LOW (ref 3.87–5.11)
RDW: 14.5 % (ref 11.5–15.5)
WBC: 7.9 10*3/uL (ref 4.0–10.5)
nRBC: 0 % (ref 0.0–0.2)

## 2019-10-01 LAB — ECHOCARDIOGRAM COMPLETE
Height: 63 in
Weight: 2444.46 oz

## 2019-10-01 LAB — PREALBUMIN: Prealbumin: 10 mg/dL — ABNORMAL LOW (ref 18–38)

## 2019-10-01 LAB — TSH: TSH: 8.686 u[IU]/mL — ABNORMAL HIGH (ref 0.350–4.500)

## 2019-10-01 MED ORDER — POTASSIUM CHLORIDE 10 MEQ/100ML IV SOLN
10.0000 meq | INTRAVENOUS | Status: AC
  Administered 2019-10-01 – 2019-10-02 (×4): 10 meq via INTRAVENOUS
  Filled 2019-10-01 (×2): qty 100

## 2019-10-01 MED ORDER — SODIUM CHLORIDE 0.9 % IV SOLN: INTRAVENOUS | Status: DC

## 2019-10-01 NOTE — Progress Notes (Addendum)
PROGRESS NOTE    Alyssa Odonnell  [id number] DOB: Apr 25, 1934 DOA: 09/30/2019 PCP: Drake Leach, MD   Brief Narrative:  HPI On 09/30/2019 by Dr. Fuller Plan Alyssa Odonnell is a 85 y.o. female with medical history significant of hypertension, hyperlipidemia, hiatal hernia with gastric outlet obstruction, PAF on Eliquis, and CVA.  Patient presents with reports of being unable to care for herself. She was recently hospitalized from 11/9-12/8 after having a large hiatal hernia with gastric outlet obstruction and volvulus.  Patient had a prolonged hospitalization requiring laparoscopic gastric plexi on 09/01/2019 with placement of gastrostomy tube and lysis of adhesions.  Subsequently, she developed acute ischemic left fourth and fifth toes requiring arteriogram and stent to the left SFA and balloon angioplasty of the trunk on 11/23 by vascular surgery.  Following the procedure patient had a groin hematoma requiring transfusion of 2 units of packed red blood cells.  She was initially sent to a skilled nursing facility, but reports that staff were rude and were not giving adequate care.  Therefore patient left after being there when they came home.  She lives at home alone and was utilizing a walker to ambulate.  After getting home she was told that people would come out to help and assist her, but no one ever came.  She complains of being constantly nauseous and has significant shortness of breath with exertion.  Leaning to either side also worsens shortness of breath symptoms.  She reports that she has been nauseous, and only able to eat soup.  Interim history Admitted for failure to thrive and generalized weakness Assessment & Plan   Failure to thrive/generalized weakness -Patient was discharged recently on 09/25/2019 to a nursing facility at which point patient decided to check herself out as she felt she was getting a poor care.  After returning home she noted she was unable to care for herself  and states she was not receiving help as she initially thought she would -PT, OT consulted -TOC consulted  Dyspnea secondary to bilateral pleural effusions -CTA of the chest showed moderate bilateral pleural effusions left greater than the right -BNP 123.1 -Was given Lasix x1 dose, and breathing has improved -Echocardiogram 55 to 60%, mildly increased LV hypertrophy.  LV diastolic parameters are indeterminate.  Mild to moderate pericardial effusion no circumferential.  No evidence of tamponade physiology by echo  Malnutrition -Albumin 2.8 on admission -Does have a G-tube however was recently discharged today heart healthy diet and the G-tube has not been used -Prealbumin 10 -Nutrition and speech therapy consulted  Elevated D-dimer -D-dimer 6.44 -CTA chest negative for pulmonary embolism -Will check lower extremity Dopplers  Large hiatal hernia -Status post laparoscopic gastric plexi with lysis of adhesions, placement of G-tube on 09/01/2019 (PEG tube will have to be in place for at least 6 weeks prior to being removed) -As above, patient was not receiving tube feeds -Speech therapy consulted.  Of note, patient was discharged on 09/25/2019 to SNF on a heart healthy diet  Paroxysmal atrial fibrillation -On chronic anticoagulation with Eliquis -Continue Eliquis and amiodarone  Peripheral vascular disease  -Patient was recently noted to have acute ischemic left fourth and fifth toes -Recent admission, patient was seen by vascular surgery.  Status post stenting of the left SFA on 09/11/2019 -Continue Plavix  Hypothyroidism -TSH 8.686 -Continue Synthroid -would recheck TSH in 2 weeks  GERD -Continue PPI  Chronic kidney disease, stage IIIb -Stable  Chronic normocytic anemia -hemoglobin appears stable  DVT Prophylaxis Eliquis  Code Status: Full  Family Communication: None at bedside  Disposition Plan: Admitted.  Pending improvement in her symptoms.  Suspect SNF at  discharge however pending PT and OT evaluations  Consultants None  Procedures  Echocardiogram  Antibiotics   Anti-infectives (From admission, onward)   None      Subjective:   Alyssa Odonnell seen and examined today.  Patient continues to feel weakness.  She states that she is unable to take care of herself at home.  Currently denies any chest pain.  Feels her breathing is improved since getting Lasix in the emergency department yesterday.  Denies current abdominal pain, nausea or vomiting, dizziness or headache.  Would like to have her PEG tube removed.  Objective:   Vitals:   09/30/19 2058 09/30/19 2200 09/30/19 2204 10/01/19 0416  BP: 107/65  136/62 132/64  Pulse: 98  97 92  Resp: 18  18 17   Temp:   98.8 F (37.1 C) 98 F (36.7 C)  TempSrc:   Oral Oral  SpO2: 99%  100% 98%  Weight:  69.3 kg    Height:  5\' 3"  (1.6 m)      Intake/Output Summary (Last 24 hours) at 10/01/2019 1335 Last data filed at 10/01/2019 0543 Gross per 24 hour  Intake 80 ml  Output 500 ml  Net -420 ml   Filed Weights   09/30/19 2200  Weight: 69.3 kg    Exam  General: Well developed, chronically ill-appearing, elderly, NAD  HEENT: NCAT, mucous membranes moist.   Cardiovascular: S1 S2 auscultated, RRR, SEM  Respiratory: Diminished breath sounds  Abdomen: Soft, nontender, nondistended, + bowel sounds, peg  Extremities: warm dry without cyanosis clubbing or edema  Neuro: AAOx3, nonfocal  Psych: Appropriate mood and affect   Data Reviewed: I have personally reviewed following labs and imaging studies  CBC: Recent Labs  Lab 09/25/19 0357 09/30/19 1328 10/01/19 0316  WBC 7.6 10.2 7.9  NEUTROABS  --  7.7  --   HGB 7.7* 9.6* 8.7*  HCT 24.8* 31.4* 28.0*  MCV 91.9 94.0 90.3  PLT 359 344 [id number]   Basic Metabolic Panel: Recent Labs  Lab 09/25/19 0357 09/30/19 1328 10/01/19 0316  NA 137 137 138  K 4.1 3.7 3.3*  CL 99 102 99  CO2 28 22 25   GLUCOSE 113* 103* 92  BUN 16 22 19     CREATININE 1.20* 1.39* 1.34*  CALCIUM 8.6* 8.9 8.7*   GFR: Estimated Creatinine Clearance: 29.7 mL/min (A) (by C-G formula based on SCr of 1.34 mg/dL (H)). Liver Function Tests: Recent Labs  Lab 09/25/19 0357 09/30/19 1328  AST 13* 12*  ALT 14 14  ALKPHOS 80 73  BILITOT 0.3 0.9  PROT 5.3* 6.4*  ALBUMIN 2.1* 2.8*   No results for input(s): LIPASE, AMYLASE in the last 168 hours. No results for input(s): AMMONIA in the last 168 hours. Coagulation Profile: No results for input(s): INR, PROTIME in the last 168 hours. Cardiac Enzymes: No results for input(s): CKTOTAL, CKMB, CKMBINDEX, TROPONINI in the last 168 hours. BNP (last 3 results) No results for input(s): PROBNP in the last 8760 hours. HbA1C: No results for input(s): HGBA1C in the last 72 hours. CBG: Recent Labs  Lab 09/25/19 0003 09/25/19 0743 09/25/19 1652 09/26/19 0000 09/26/19 0855  GLUCAP 109* 100* 128* 119* 99   Lipid Profile: No results for input(s): CHOL, HDL, LDLCALC, TRIG, CHOLHDL, LDLDIRECT in the last 72 hours. Thyroid Function Tests: Recent Labs    10/01/19 0316  TSH 8.686*  Anemia Panel: No results for input(s): VITAMINB12, FOLATE, FERRITIN, TIBC, IRON, RETICCTPCT in the last 72 hours. Urine analysis:    Component Value Date/Time   COLORURINE STRAW (A) 09/30/2019 2057   APPEARANCEUR CLEAR 09/30/2019 2057   LABSPEC 1.010 09/30/2019 2057   PHURINE 6.0 09/30/2019 2057   GLUCOSEU NEGATIVE 09/30/2019 2057   HGBUR NEGATIVE 09/30/2019 2057   BILIRUBINUR NEGATIVE 09/30/2019 2057   KETONESUR NEGATIVE 09/30/2019 2057   PROTEINUR NEGATIVE 09/30/2019 2057   NITRITE NEGATIVE 09/30/2019 2057   LEUKOCYTESUR TRACE (A) 09/30/2019 2057   Sepsis Labs: @LABRCNTIP (procalcitonin:4,lacticidven:4)  ) Recent Results (from the past 240 hour(s))  SARS CORONAVIRUS 2 (TAT 6-24 HRS) Nasopharyngeal Nasopharyngeal Swab     Status: None   Collection Time: 09/21/19  2:30 PM   Specimen: Nasopharyngeal Swab  Result  Value Ref Range Status   SARS Coronavirus 2 NEGATIVE NEGATIVE Final    Comment: (NOTE) SARS-CoV-2 target nucleic acids are NOT DETECTED. The SARS-CoV-2 RNA is generally detectable in upper and lower respiratory specimens during the acute phase of infection. Negative results do not preclude SARS-CoV-2 infection, do not rule out co-infections with other pathogens, and should not be used as the sole basis for treatment or other patient management decisions. Negative results must be combined with clinical observations, patient history, and epidemiological information. The expected result is Negative. Fact Sheet for Patients: SugarRoll.be Fact Sheet for Healthcare Providers: https://www.woods-mathews.com/ This test is not yet approved or cleared by the [place] FDA and  has been authorized for detection and/or diagnosis of SARS-CoV-2 by FDA under an Emergency Use Authorization (EUA). This EUA will remain  in effect (meaning this test can be used) for the duration of the COVID-19 declaration under Section 56 4(b)(1) of the Act, 21 U.S.C. section 360bbb-3(b)(1), unless the authorization is terminated or revoked sooner. Performed at [facility], [place] [address], [place], [place] [address]   SARS CORONAVIRUS 2 (TAT 6-24 HRS) Nasopharyngeal Nasopharyngeal Swab     Status: None   Collection Time: 09/25/19  5:20 PM   Specimen: Nasopharyngeal Swab  Result Value Ref Range Status   SARS Coronavirus 2 NEGATIVE NEGATIVE Final    Comment: (NOTE) SARS-CoV-2 target nucleic acids are NOT DETECTED. The SARS-CoV-2 RNA is generally detectable in upper and lower respiratory specimens during the acute phase of infection. Negative results do not preclude SARS-CoV-2 infection, do not rule out co-infections with other pathogens, and should not be used as the sole basis for treatment or other patient management decisions. Negative results must be combined  with clinical observations, patient history, and epidemiological information. The expected result is Negative. Fact Sheet for Patients: SugarRoll.be Fact Sheet for Healthcare Providers: https://www.woods-mathews.com/ This test is not yet approved or cleared by the [place] FDA and  has been authorized for detection and/or diagnosis of SARS-CoV-2 by FDA under an Emergency Use Authorization (EUA). This EUA will remain  in effect (meaning this test can be used) for the duration of the COVID-19 declaration under Section 56 4(b)(1) of the Act, 21 U.S.C. section 360bbb-3(b)(1), unless the authorization is terminated or revoked sooner. Performed at [facility], [place] [address], [place], [place] [address]   SARS CORONAVIRUS 2 (TAT 6-24 HRS) Nasopharyngeal Nasopharyngeal Swab     Status: None   Collection Time: 09/30/19  3:17 PM   Specimen: Nasopharyngeal Swab  Result Value Ref Range Status   SARS Coronavirus 2 NEGATIVE NEGATIVE Final    Comment: (NOTE) SARS-CoV-2 target nucleic acids are NOT DETECTED.  The SARS-CoV-2 RNA is generally detectable in upper and lower respiratory specimens during the acute phase of infection. Negative results do not preclude SARS-CoV-2 infection, do not rule out co-infections with other pathogens, and should not be used as the sole basis for treatment or other patient management decisions. Negative results must be combined with clinical observations, patient history, and epidemiological information. The expected result is Negative. Fact Sheet for Patients: SugarRoll.be Fact Sheet for Healthcare Providers: https://www.woods-mathews.com/ This test is not yet approved or cleared by the [place] FDA and  has been authorized for detection and/or diagnosis of SARS-CoV-2 by FDA under an Emergency Use Authorization (EUA). This EUA will remain  in effect (meaning this test  can be used) for the duration of the COVID-19 declaration under Section 56 4(b)(1) of the Act, 21 U.S.C. section 360bbb-3(b)(1), unless the authorization is terminated or revoked sooner. Performed at [facility], [place] [address], [place], [place] [address]       Radiology Studies: DG Chest 2 View  Result Date: 09/30/2019 CLINICAL DATA:  Shortness of breath EXAM: CHEST - 2 VIEW COMPARISON:  08/29/2019 FINDINGS: Heart size is mildly enlarged, stable. No pulmonary vascular congestion. Moderate bilateral pleural effusions with associated bibasilar opacities. No pneumothorax. Degenerative changes of the shoulders. IMPRESSION: Moderate bilateral pleural effusions with associated bibasilar opacities, likely atelectasis. Electronically Signed   By: Davina Poke M.D.   On: 09/30/2019 16:10   CT Angio Chest PE W/Cm &/Or Wo Cm  Result Date: 09/30/2019 CLINICAL DATA:  Positive D-dimer. EXAM: CT ANGIOGRAPHY CHEST WITH CONTRAST TECHNIQUE: Multidetector CT imaging of the chest was performed using the standard protocol during bolus administration of intravenous contrast. Multiplanar CT image reconstructions and MIPs were obtained to evaluate the vascular anatomy. CONTRAST:  33mL OMNIPAQUE IOHEXOL 300 MG/ML  SOLN COMPARISON:  August 03, 2016. FINDINGS: Cardiovascular: Satisfactory opacification of the pulmonary arteries to the segmental level. No evidence of pulmonary embolism. Normal heart size. Mild pericardial effusion is noted. Coronary artery calcifications noted. Atherosclerosis of thoracic aorta is noted without aneurysm formation. Mediastinum/Nodes: Thyroid gland is unremarkable. No adenopathy is noted. Large sliding-type hiatal hernia is noted. Lungs/Pleura: No pneumothorax is noted. Moderate bilateral pleural effusions are noted, left greater than right, with associated atelectasis of both lower lobes. Upper Abdomen: No acute abnormality. Musculoskeletal: No chest wall abnormality. No acute or  significant osseous findings. Review of the MIP images confirms the above findings. IMPRESSION: 1. No definite evidence of pulmonary embolus. 2. Coronary artery calcifications are noted suggesting coronary artery disease. 3. Large sliding-type hiatal hernia is noted. 4. Moderate bilateral pleural effusions are noted, left greater than right, with associated atelectasis of both lower lobes. 5. Aortic atherosclerosis. Aortic Atherosclerosis (ICD10-I70.0). Electronically Signed   By: Marijo Conception M.D.   On: 09/30/2019 16:06   ECHOCARDIOGRAM COMPLETE  Result Date: 10/01/2019   ECHOCARDIOGRAM REPORT   Patient Name:   Alyssa Odonnell Date of Exam: 10/01/2019 Medical Rec #:  [id number]      Height:       63.0 in Accession #:    [id number]     Weight:       152.8 lb Date of Birth:  07/30/1934      BSA:          1.72 m Patient Age:    11 years       BP:           132/64 mmHg Patient Gender: F  HR:           84 bpm. Exam Location:  Inpatient Procedure: 2D Echo Indications:    Congestive Heart Failure 428.0/I50.9  History:        Patient has prior history of Echocardiogram examinations, most                 recent 03/11/2017. Arrythmias:Atrial Fibrillation; Risk                 Factors:Hypertension and Dyslipidemia. Hx CVA.  Sonographer:    Clayton Lefort RDCS (AE) Referring Phys: [id number] RONDELL A SMITH IMPRESSIONS  1. Left ventricular ejection fraction, by visual estimation, is 55 to 60%. The left ventricle has normal function. There is mildly increased left ventricular hypertrophy.  2. Left ventricular diastolic parameters are indeterminate.  3. The left ventricle has no regional wall motion abnormalities.  4. Global right ventricle has normal systolic function.The right ventricular size is normal. No increase in right ventricular wall thickness.  5. Left atrial size was normal.  6. Right atrial size was normal.  7. Moderate pericardial effusion.  8. The pericardial effusion is circumferential.  9. Mild to  moderate pericardial effusion that is circumferential. Mild around the LV and RV, moderate adjacent to the RA. There is borderline respiratory variation approximately 20% across the MV, RA indentation without clear collapse. No RV collapse. Normal IVC. Collectively there is not evidence of tamponade physiology by echo. 10. The mitral valve is normal in structure. No evidence of mitral valve regurgitation. No evidence of mitral stenosis. 11. The tricuspid valve is normal in structure. Tricuspid valve regurgitation is trivial. 12. The aortic valve is tricuspid. Aortic valve regurgitation is not visualized. Mild aortic valve stenosis. 13. The pulmonic valve was not well visualized. Pulmonic valve regurgitation is not visualized. 14. Normal pulmonary artery systolic pressure. FINDINGS  Left Ventricle: Left ventricular ejection fraction, by visual estimation, is 55 to 60%. The left ventricle has normal function. The left ventricle has no regional wall motion abnormalities. There is mildly increased left ventricular hypertrophy. Left ventricular diastolic parameters are indeterminate. Right Ventricle: The right ventricular size is normal. No increase in right ventricular wall thickness. Global RV systolic function is has normal systolic function. The tricuspid regurgitant velocity is 2.38 m/s, and with an assumed right atrial pressure  of 3 mmHg, the estimated right ventricular systolic pressure is normal at 25.8 mmHg. Left Atrium: Left atrial size was normal in size. Right Atrium: Right atrial size was normal in size Pericardium: A moderately sized pericardial effusion is present. The pericardial effusion is circumferential. Mild to moderate pericardial effusion that is circumferential. Mild around the LV and RV, moderate adjacent to the RA. There is borderline respiratory variation approximately 20% across the MV, RA indentation without clear collapse. No RV collapse. Normal IVC. Collectively there is not evidence of  tamponade physiology by echo. Mitral Valve: The mitral valve is normal in structure. No evidence of mitral valve regurgitation. No evidence of mitral valve stenosis by observation. MV peak gradient, 3.1 mmHg. Tricuspid Valve: The tricuspid valve is normal in structure. Tricuspid valve regurgitation is trivial. Aortic Valve: The aortic valve is tricuspid. Aortic valve regurgitation is not visualized. Mild aortic stenosis is present. Moderate aortic valve annular calcification. There is moderate calcification of the aortic valve. Aortic valve mean gradient measures 10.2 mmHg. Aortic valve peak gradient measures 19.6 mmHg. Aortic valve area, by VTI measures 1.73 cm. Pulmonic Valve: The pulmonic valve was not well visualized. Pulmonic valve regurgitation is  not visualized. Pulmonic regurgitation is not visualized. No evidence of pulmonic stenosis. Aorta: The aortic root is normal in size and structure. Pulmonary Artery: Indeterminant PASP, inadequate TR jet. IAS/Shunts: No atrial level shunt detected by color flow Doppler.  LEFT VENTRICLE PLAX 2D LVIDd:         3.60 cm  Diastology LVIDs:         2.30 cm  LV e' lateral:   8.70 cm/s LV PW:         1.20 cm  LV E/e' lateral: 7.8 LV IVS:        1.00 cm  LV e' medial:    7.62 cm/s LVOT diam:     2.10 cm  LV E/e' medial:  8.9 LV SV:         36 ml LV SV Index:   20.48 LVOT Area:     3.46 cm  RIGHT VENTRICLE             IVC RV Basal diam:  1.90 cm     IVC diam: 1.40 cm RV S prime:     12.10 cm/s LEFT ATRIUM             Index       RIGHT ATRIUM          Index LA diam:        3.30 cm 1.91 cm/m  RA Area:     9.40 cm LA Vol (A2C):   59.6 ml 34.56 ml/m RA Volume:   14.90 ml 8.64 ml/m LA Vol (A4C):   32.7 ml 18.96 ml/m LA Biplane Vol: 45.8 ml 26.56 ml/m  AORTIC VALVE AV Area (Vmax):    1.64 cm AV Area (Vmean):   1.57 cm AV Area (VTI):     1.73 cm AV Vmax:           221.20 cm/s AV Vmean:          148.000 cm/s AV VTI:            0.361 m AV Peak Grad:      19.6 mmHg AV Mean  Grad:      10.2 mmHg LVOT Vmax:         104.97 cm/s LVOT Vmean:        67.100 cm/s LVOT VTI:          0.180 m LVOT/AV VTI ratio: 0.50  AORTA Ao Root diam: 2.70 cm MITRAL VALVE                        TRICUSPID VALVE MV Area (PHT): 3.42 cm             TR Peak grad:   22.8 mmHg MV Peak grad:  3.1 mmHg             TR Vmax:        242.00 cm/s MV Mean grad:  1.0 mmHg MV Vmax:       0.89 m/s             SHUNTS MV Vmean:      51.6 cm/s            Systemic VTI:  0.18 m MV VTI:        0.20 m               Systemic Diam: 2.10 cm MV PHT:        64.24 msec MV Decel Time: 222 msec MV E velocity: 68.15  cm/s 103 cm/s MV A velocity: 57.85 cm/s 70.3 cm/s MV E/A ratio:  1.18       1.5  Carlyle Dolly MD Electronically signed by Carlyle Dolly MD Signature Date/Time: 10/01/2019/12:20:09 PM    Final      Scheduled Meds: . amiodarone  200 mg Oral Daily  . apixaban  5 mg Oral BID  . clopidogrel  75 mg Oral Q breakfast  . latanoprost  1 drop Both Eyes QHS  . levothyroxine  100 mcg Oral QAC breakfast  . lidocaine  1 patch Transdermal Q24H  . pantoprazole  40 mg Oral Daily  . sodium chloride flush  3 mL Intravenous Q12H   Continuous Infusions:   LOS: 1 day   Time Spent in minutes   45 minutes  Tyra Gural D.O. on 10/01/2019 at 1:35 PM  Between 7am to 7pm - Please see pager noted on [email or web address]  After 7pm go to [email or web address]  And look for the night coverage person covering for me after hours  [facility] Office  [phone]

## 2019-10-01 NOTE — Evaluation (Signed)
Physical Therapy Evaluation Patient Details Name: Alyssa Odonnell MRN: [id number] DOB: [birth year] Today's Date: 10/01/2019   History of Present Illness  LEEONNA Odonnell is a 85 y.o. female with medical history significant of hypertension, hyperlipidemia, hiatal hernia with gastric outlet obstruction, PAF on Eliquis, and CVA.  Patient presents with reports of being unable to care for herself. She was recently hospitalized from 11/9-12/8 after having a large hiatal hernia with gastric outlet obstruction and volvulus.  Patient had a prolonged hospitalization requiring laparoscopic gastric plexi on 09/01/2019 with placement of gastrostomy tube and lysis of adhesions.  Subsequently, she developed acute ischemic left fourth and fifth toes requiring arteriogram and stent to the left SFA and balloon angioplasty of the trunk on 11/23 by vascular surgery.  Following the procedure patient had a groin hematoma requiring transfusion of 2 units of packed red blood cells.  She was initially sent to a skilled nursing facility, but reports that staff were rude and were not giving adequate care.  Therefore patient left; lives home alone  Clinical Impression   Pt admitted with above diagnosis. Prior to initial episode of care, pt was living alone, using RW to walk, independent with aDLs, and trying to enjoy gardening when she could; Presents now to PT with generalized weakness, functional debility;  Pt currently with functional limitations due to the deficits listed below (see PT Problem List). Pt will benefit from skilled PT to increase their independence and safety with mobility to allow discharge to the venue listed below.       Follow Up Recommendations SNF;Supervision for mobility/OOB    Equipment Recommendations  None recommended by PT    Recommendations for Other Services       Precautions / Restrictions Precautions Precautions: Fall Precaution Comments: g tube      Mobility  Bed Mobility Overal bed  mobility: Needs Assistance Bed Mobility: Supine to Sit     Supine to sit: Mod assist     General bed mobility comments: Mod handheld assist to pull to sit  Transfers Overall transfer level: Needs assistance Equipment used: Rolling walker (2 wheeled) Transfers: Sit to/from Stand Sit to Stand: Mod assist         General transfer comment: ModA to power up, cues for hand placement  Ambulation/Gait Ambulation/Gait assistance: Mod assist Gait Distance (Feet): (pivotal steps bed to chair) Assistive device: Rolling walker (2 wheeled) Gait Pattern/deviations: Patent attorney    Modified Rankin (Stroke Patients Only)       Balance Overall balance assessment: Needs assistance Sitting-balance support: Feet supported;Bilateral upper extremity supported Sitting balance-Leahy Scale: Fair Sitting balance - Comments: supervision with feet supported   Standing balance support: Bilateral upper extremity supported;During functional activity Standing balance-Leahy Scale: Poor Standing balance comment: reliant on external support                             Pertinent Vitals/Pain Pain Assessment: Faces Faces Pain Scale: Hurts little more Pain Location: bilat knees and R hip Pain Descriptors / Indicators: Aching;Discomfort Pain Intervention(s): Monitored during session    Home Living Family/patient expects to be discharged to:: Skilled nursing facility Living Arrangements: Alone               Additional Comments: Daughter with health problems and not able to physically assist; she was at home after leaving  a SNF from previous episode of care for approx 1 day, and was unable to care for herself    Prior Function Level of Independence: Independent with assistive device(s);Needs assistance   Gait / Transfers Assistance Needed: walked with RW prior to previous episode of care which started in November  ADL's /  Homemaking Assistance Needed: Unable to care for herself at home        Hand Dominance        Extremity/Trunk Assessment   Upper Extremity Assessment Upper Extremity Assessment: Generalized weakness    Lower Extremity Assessment Lower Extremity Assessment: Generalized weakness    Cervical / Trunk Assessment Cervical / Trunk Assessment: Other exceptions Cervical / Trunk Exceptions: G tibe  Communication   Communication: No difficulties  Cognition Arousal/Alertness: Awake/alert Behavior During Therapy: WFL for tasks assessed/performed;Anxious Overall Cognitive Status: Within Functional Limits for tasks assessed                                 General Comments: very talkative, still some anxiety with mobility      General Comments General comments (skin integrity, edema, etc.): VSS    Exercises     Assessment/Plan    PT Assessment Patient needs continued PT services  PT Problem List Decreased strength;Decreased activity tolerance;Decreased balance;Decreased mobility;Decreased knowledge of use of DME;Decreased knowledge of precautions;Pain       PT Treatment Interventions DME instruction;Gait training;Stair training;Functional mobility training;Therapeutic activities;Therapeutic exercise;Balance training;Patient/family education    PT Goals (Current goals can be found in the Care Plan section)  Acute Rehab PT Goals Patient Stated Goal: Get to a rehab center where she will get good care PT Goal Formulation: With patient Time For Goal Achievement: 10/15/19 Potential to Achieve Goals: Fair    Frequency Min 2X/week   Barriers to discharge        Co-evaluation               AM-PAC PT "6 Clicks" Mobility  Outcome Measure Help needed turning from your back to your side while in a flat bed without using bedrails?: A Little Help needed moving from lying on your back to sitting on the side of a flat bed without using bedrails?: A Little Help  needed moving to and from a bed to a chair (including a wheelchair)?: A Lot Help needed standing up from a chair using your arms (e.g., wheelchair or bedside chair)?: A Lot Help needed to walk in hospital room?: A Lot Help needed climbing 3-5 steps with a railing? : Total 6 Click Score: 13    End of Session   Activity Tolerance: Patient limited by fatigue Patient left: in chair;with call bell/phone within reach;with chair alarm set Nurse Communication: Mobility status PT Visit Diagnosis: Other abnormalities of gait and mobility (R26.89);Pain Pain - Right/Left: Left Pain - part of body: Knee    Time: [id number] PT Time Calculation (min) (ACUTE ONLY): 20 min   Charges:   PT Evaluation $PT Eval Moderate Complexity: 1 Mod          Roney [place], [place]  Acute Rehabilitation Services Pager [phone] Office [phone]   Colletta [place] 10/01/2019, 6:30 PM

## 2019-10-01 NOTE — Progress Notes (Signed)
  Echocardiogram 2D Echocardiogram has been performed.  Alyssa Odonnell 10/01/2019, 12:00 PM

## 2019-10-02 ENCOUNTER — Inpatient Hospital Stay (HOSPITAL_COMMUNITY): Payer: Medicare Other

## 2019-10-02 DIAGNOSIS — E440 Moderate protein-calorie malnutrition: Secondary | ICD-10-CM | POA: Insufficient documentation

## 2019-10-02 DIAGNOSIS — R7989 Other specified abnormal findings of blood chemistry: Secondary | ICD-10-CM

## 2019-10-02 LAB — BASIC METABOLIC PANEL
Anion gap: 13 (ref 5–15)
BUN: 18 mg/dL (ref 8–23)
CO2: 24 mmol/L (ref 22–32)
Calcium: 8.6 mg/dL — ABNORMAL LOW (ref 8.9–10.3)
Chloride: 100 mmol/L (ref 98–111)
Creatinine, Ser: 1.25 mg/dL — ABNORMAL HIGH (ref 0.44–1.00)
GFR calc Af Amer: 46 mL/min — ABNORMAL LOW (ref >60)
GFR calc non Af Amer: 40 mL/min — ABNORMAL LOW (ref >60)
Glucose, Bld: 88 mg/dL (ref 70–99)
Potassium: 3.8 mmol/L (ref 3.5–5.1)
Sodium: 137 mmol/L (ref 135–145)

## 2019-10-02 LAB — CBC
HCT: 28.7 % — ABNORMAL LOW (ref 36.0–46.0)
Hemoglobin: 8.9 g/dL — ABNORMAL LOW (ref 12.0–15.0)
MCH: 28.2 pg (ref 26.0–34.0)
MCHC: 31 g/dL (ref 30.0–36.0)
MCV: 90.8 fL (ref 80.0–100.0)
Platelets: 343 10*3/uL (ref 150–400)
RBC: 3.16 MIL/uL — ABNORMAL LOW (ref 3.87–5.11)
RDW: 14.4 % (ref 11.5–15.5)
WBC: 8.9 10*3/uL (ref 4.0–10.5)
nRBC: 0 % (ref 0.0–0.2)

## 2019-10-02 MED ORDER — SODIUM CHLORIDE 0.9 % IV SOLN: INTRAVENOUS | Status: DC

## 2019-10-02 MED ORDER — BOOST / RESOURCE BREEZE PO LIQD CUSTOM
1.0000 | Freq: Three times a day (TID) | ORAL | Status: DC
  Administered 2019-10-02 – 2019-10-04 (×7): 1 via ORAL

## 2019-10-02 MED ORDER — ADULT MULTIVITAMIN W/MINERALS CH
1.0000 | ORAL_TABLET | Freq: Every day | ORAL | Status: DC
  Administered 2019-10-02 – 2019-10-05 (×4): 1 via ORAL
  Filled 2019-10-02 (×4): qty 1

## 2019-10-02 MED ORDER — WHITE PETROLATUM EX OINT
TOPICAL_OINTMENT | CUTANEOUS | Status: AC
  Administered 2019-10-02
  Filled 2019-10-02: qty 28.35

## 2019-10-02 MED ORDER — SENNOSIDES-DOCUSATE SODIUM 8.6-50 MG PO TABS
1.0000 | ORAL_TABLET | Freq: Every day | ORAL | Status: DC
  Administered 2019-10-03 – 2019-10-04 (×2): 1 via ORAL
  Filled 2019-10-02 (×2): qty 1

## 2019-10-02 MED ORDER — ACETAMINOPHEN 325 MG PO TABS
650.0000 mg | ORAL_TABLET | Freq: Three times a day (TID) | ORAL | Status: DC
  Administered 2019-10-02 (×2): 650 mg via ORAL
  Filled 2019-10-02 (×3): qty 2

## 2019-10-02 NOTE — TOC Initial Note (Addendum)
Transition of Care [facility]) - Initial/Assessment Note    Patient Details  Name: Alyssa Odonnell MRN: [id number] Date of Birth: 10-08-1934  Transition of Care [facility]) CM/SW Contact:    Alexander Mt, [place] Phone Number: 10/02/2019, 10:21 AM  Clinical Narrative:                 CSW spoke with pt at bedside. Pt from home where she was only for a day or two after discharging early from SNF. Pt had an extended hospital stay in November where she discharged to [facility]. Pt did not have 24/7 care at home therefore quickly readmitted. Pt prior to previous hospitalization was at home where she lived with her daughter. Pt brother also lives nearby but she says is 83 years old. Pt would like re-referral to SNF but prefers not to return to SNF. She would like referral to [facility].   CSW will complete fl2 and send referral. Pt will need new insurance authorization to complete SNF placement.   CSW provided emotional support and offered to connect pt to Sharyn Lull for updates- pt states she is okay at this time and that her brother Bud is going to call her.   Pt did make two comments regarding death and dying while CSW was in the room, she did not elaborate further when asked. CSW will request PMT consult for further goals of care as this conversation may be helpful for pt moving forward.  TOC team continues to follow.   Expected Discharge Plan: Skilled Nursing Facility Barriers to Discharge: Continued Medical Work up, Ship broker, Environmental education officer)   Patient Goals and CMS Choice Patient states their goals for this hospitalization and ongoing recovery are:: to get some help to take care of myself CMS Medicare.gov Compare Post Acute Care list provided to:: Patient Choice offered to / list presented to : Patient  Expected Discharge Plan and Services Expected Discharge Plan: [place] In-house Referral: Clinical Social Work Discharge Planning  Services: CM Consult Post Acute Care Choice: [place] Living arrangements for the past 2 months: [place], [place]    Prior Living Arrangements/Services Living arrangements for the past 2 months: [place], [place] Lives with:: Self, Adult Children Patient language and need for interpreter reviewed:: Yes(no needs) Do you feel safe going back to the place where you live?: Yes      Need for Family Participation in Patient Care: Yes (Comment)(assistance with ADL/IADLs) Care giver support system in place?: No (comment)(pt daughter in ICU at [facility], pt brother is elderly) Current home services: DME Criminal Activity/Legal Involvement Pertinent to Current Situation/Hospitalization: No - Comment as needed  Activities of Daily Living Home Assistive Devices/Equipment: None ADL Screening (condition at time of admission) Patient's cognitive ability adequate to safely complete daily activities?: Yes Is the patient deaf or have difficulty hearing?: No Does the patient have difficulty seeing, even when wearing glasses/contacts?: No Does the patient have difficulty concentrating, remembering, or making decisions?: No Patient able to express need for assistance with ADLs?: No Does the patient have difficulty dressing or bathing?: No Independently performs ADLs?: Yes (appropriate for developmental age) Does the patient have difficulty walking or climbing stairs?: No Weakness of Legs: None Weakness of Arms/Hands: None  Permission Sought/Granted Permission sought to share information with : Facility Sport and exercise psychologist, Family Supports Permission granted to share information with : Yes, Verbal Permission Granted  Share Information with NAME: Bud  Harris  Permission granted to share info w AGENCY: SNFs  Permission granted to share info w Relationship: brother  Permission granted to share info w Contact Information:  [phone]  Emotional Assessment Appearance:: Appears stated age Attitude/Demeanor/Rapport: Other (comment)(Frustrated) Affect (typically observed): Overwhelmed, Frustrated, Flat, Restless Orientation: : Oriented to Self, Oriented to Place, Oriented to Situation, Oriented to  Time Alcohol / Substance Use: Not Applicable Psych Involvement: No (comment)  Admission diagnosis:  Failure to thrive in adult [R62.7] Generalized weakness [R53.1] Patient Active Problem List   Diagnosis Date Noted  . Pressure injury of skin 10/01/2019  . Malnutrition ([place]) 10/01/2019  . Hypothyroid 10/01/2019  . Failure to thrive in adult 09/30/2019  . Pleural effusion, bilateral 09/30/2019  . Chronic anticoagulation 09/30/2019  . 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 Gastric outlet obstruction 08/29/2019  . PAF (paroxysmal atrial fibrillation) ([place]) 02/02/2018  . 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 Cerebrovascular accident (CVA) due to embolism of left posterior cerebral artery ([place]) 03/09/2017  . htn 03/09/2017  . Weakness 03/09/2017  . Stroke-like symptom 03/09/2017  . Right sided weakness   . PAD (peripheral artery disease) ([place])   . Essential hypertension   . Spinal stenosis of lumbar region   . Pure hypercholesterolemia   . Recurrent major depressive disorder ([place])    PCP:  Drake Leach, MD Pharmacy:   [place] [place] [address] [address] [place] [address] Phone: [phone] Fax: [facility]), [place] [id number] [place] ([place]) [place] [address] Phone: [phone] Fax: [phone]   Readmission Risk Interventions Readmission Risk Prevention Plan 10/02/2019  Post Dischage Appt Not Complete  Appt Comments plan for SNF  Medication Screening Complete  Transportation Screening Complete  Some recent data might be hidden

## 2019-10-02 NOTE — NC FL2 (Signed)
[place] LEVEL OF CARE SCREENING TOOL     IDENTIFICATION  Patient Name: Alyssa Odonnell Birthdate: 1933-10-28 Sex: female Admission Date (Current Location): 09/30/2019  [facility] and [place] Number:  Herbalist and Address:  [facility] [place], [place] [address], [place], [place] [address]      Provider Number: [id number]  Attending Physician Name and Address:  Cristal Ford, DO  Relative Name and Phone Number:       Current Level of Care: Hospital Recommended Level of Care: [place] Prior Approval Number:    Date Approved/Denied:   PASRR Number: [id number]  Discharge Plan: SNF    Current Diagnoses: Patient Active Problem List   Diagnosis Date Noted  . Pressure injury of skin 10/01/2019  . Malnutrition ([place]) 10/01/2019  . Hypothyroid 10/01/2019  . Failure to thrive in adult 09/30/2019  . Pleural effusion, bilateral 09/30/2019  . Chronic anticoagulation 09/30/2019  . 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 Gastric outlet obstruction 08/29/2019  . PAF (paroxysmal atrial fibrillation) ([place]) 02/02/2018  . 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[place]) 05/24/2017  . Cerebrovascular accident (CVA) due to embolism of left posterior cerebral artery ([place]) 03/09/2017  . htn 03/09/2017  . Weakness 03/09/2017  . Stroke-like symptom 03/09/2017  . Right sided weakness   . PAD (peripheral artery disease) ([place])   . Essential hypertension   . Spinal stenosis of lumbar region   . Pure hypercholesterolemia   . Recurrent major depressive disorder (HCC)     Orientation RESPIRATION BLADDER Height & Weight     Self, Time, Situation, Place  O2(2L nasal canula) Continent, External catheter Weight: 152 lb 12.5 oz (69.3 kg) Height:  5\' 3"  (160 cm)  BEHAVIORAL SYMPTOMS/MOOD NEUROLOGICAL BOWEL NUTRITION STATUS      Continent Diet(see discharge summary)  AMBULATORY STATUS COMMUNICATION OF NEEDS Skin   Extensive Assist Verbally PU Stage and Appropriate  Care   PU Stage 2 Dressing: (foam dressing on sacrum)                   Personal Care Assistance Level of Assistance  Bathing, Feeding, Dressing Bathing Assistance: Maximum assistance Feeding assistance: Independent Dressing Assistance: Maximum assistance     Functional Limitations Info  Sight, Hearing, Speech Sight Info: Adequate Hearing Info: Adequate Speech Info: Adequate    SPECIAL CARE FACTORS FREQUENCY  OT (By licensed OT), PT (By licensed PT)     PT Frequency: 5x week OT Frequency: 5x week            Contractures Contractures Info: Not present    Additional Factors Info  Code Status, Allergies Code Status Info: Full Code Allergies Info: Aspirin, Atorvastatin, Nabumetone, Nsaids, Pravastatin, Procaine, Rosuvastatin, Simvastatin, Statins, Evolocumab, Mobic (Meloxicam), Penicillins           Current Medications (10/02/2019):  This is the current hospital active medication list Current Facility-Administered Medications  Medication Dose Route Frequency Provider Last Rate Last Admin  . 0.9 %  sodium chloride infusion   Intravenous Continuous Cristal Ford, DO 75 mL/hr at 10/02/19 0641 Rate Change at 10/02/19 0641  . acetaminophen (TYLENOL) tablet 650 mg  650 mg Oral Q6H PRN Fuller Plan A, MD       Or  . acetaminophen (TYLENOL) suppository 650 mg  650 mg Rectal Q6H PRN Smith, Rondell A, MD      . albuterol (PROVENTIL) (2.5 MG/3ML) 0.083% nebulizer solution 2.5 mg  2.5 mg Nebulization Q6H PRN Norval Morton, MD      .  amiodarone (PACERONE) tablet 200 mg  200 mg Oral Daily Tamala Julian, Rondell A, MD   200 mg at 10/01/19 1419  . apixaban (ELIQUIS) tablet 5 mg  5 mg Oral BID Fuller Plan A, MD   5 mg at 10/01/19 2158  . clopidogrel (PLAVIX) tablet 75 mg  75 mg Oral Q breakfast Fuller Plan A, MD   75 mg at 10/01/19 1419  . cyclobenzaprine (FLEXERIL) tablet 5 mg  5 mg Oral TID PRN Elder Cyphers, NP   5 mg at 10/01/19 2158  . latanoprost (XALATAN) 0.005 %  ophthalmic solution 1 drop  1 drop Both Eyes QHS Fuller Plan A, MD   1 drop at 10/01/19 2158  . levothyroxine (SYNTHROID) tablet 100 mcg  100 mcg Oral QAC breakfast Fuller Plan A, MD   100 mcg at 10/01/19 1419  . lidocaine (LIDODERM) 5 % 1 patch  1 patch Transdermal Q24H Fuller Plan A, MD   1 patch at 10/01/19 1744  . ondansetron (ZOFRAN) tablet 4 mg  4 mg Oral Q6H PRN Fuller Plan A, MD       Or  . ondansetron (ZOFRAN) injection 4 mg  4 mg Intravenous Q6H PRN Smith, Rondell A, MD      . pantoprazole (PROTONIX) EC tablet 40 mg  40 mg Oral Daily Tamala Julian, Rondell A, MD   40 mg at 10/01/19 1419  . sodium chloride flush (NS) 0.9 % injection 3 mL  3 mL Intravenous Q12H Smith, Rondell A, MD   3 mL at 10/01/19 1420     Discharge Medications: Please see discharge summary for a list of discharge medications.  Relevant Imaging Results:  Relevant Lab Results:   Additional Information SSN:[id number]  Alexander Mt, [place]

## 2019-10-02 NOTE — Evaluation (Addendum)
Clinical/Bedside Swallow Evaluation Patient Details  Name: Alyssa Odonnell MRN: [id number] Date of Birth: 12-Jan-1934  Today's Date: 10/02/2019 Time: SLP Start Time (ACUTE ONLY): 1012 SLP Stop Time (ACUTE ONLY): 1027 SLP Time Calculation (min) (ACUTE ONLY): 15 min  Past Medical History:  Past Medical History:  Diagnosis Date  . Gastric outlet obstruction 08/2019  . Glaucoma   . Hyperlipidemia   . Hypertension   . Hypothyroid   . PAF (paroxysmal atrial fibrillation) ([place])   . Stroke [facility])    Past Surgical History:  Past Surgical History:  Procedure Laterality Date  . ABDOMINAL AORTOGRAM W/LOWER EXTREMITY Right 09/11/2019   Procedure: ABDOMINAL AORTOGRAM W/LOWER EXTREMITY;  Surgeon: Waynetta Sandy, MD;  Location: [place] CV LAB;  Service: Cardiovascular;  Laterality: Right;  . ABDOMINAL HERNIA REPAIR    . GASTROSTOMY N/A 09/01/2019   Procedure: INSERTION OF GASTROSTOMY TUBE;  Surgeon: Erroll Luna, MD;  Location: [place];  Service: General;  Laterality: N/A;  . LAPAROSCOPIC LYSIS OF ADHESIONS  09/01/2019   Procedure: Laparoscopic Lysis Of Adhesions;  Surgeon: Erroll Luna, MD;  Location: [place];  Service: General;;  . LAPAROSCOPIC NISSEN FUNDOPLICATION N/A [id number]   Procedure: LAPAROSCOPIC, POSSIBLE OPEN, GASTROPEXY;  Surgeon: Erroll Luna, MD;  Location: [place];  Service: General;  Laterality: N/A;  . LOOP RECORDER INSERTION N/A 03/11/2017   Procedure: Loop Recorder Insertion;  Surgeon: Evans Lance, MD;  Location: [place] CV LAB;  Service: Cardiovascular;  Laterality: N/A;  . PERIPHERAL VASCULAR BALLOON ANGIOPLASTY Left 09/11/2019   Procedure: PERIPHERAL VASCULAR BALLOON ANGIOPLASTY;  Surgeon: Waynetta Sandy, MD;  Location: [place] CV LAB;  Service: Cardiovascular;  Laterality: Left;  TP trunk  . PERIPHERAL VASCULAR CATHETERIZATION Right 07/06/2016   Procedure: Lower Extremity Angiography;  Surgeon: Angelia Mould, MD;  Location: [place] CV LAB;  Service: Cardiovascular;  Laterality: Right;  . PERIPHERAL VASCULAR CATHETERIZATION Right 07/06/2016   Procedure: Peripheral Vascular Intervention;  Surgeon: Angelia Mould, MD;  Location: [place] CV [facility];  Service: Cardiovascular;  Laterality: Right;  Superficial femoral  . PERIPHERAL VASCULAR INTERVENTION Left 09/11/2019   Procedure: PERIPHERAL VASCULAR INTERVENTION;  Surgeon: Waynetta Sandy, MD;  Location: [place] CV [facility];  Service: Cardiovascular;  Laterality: Left;  SFA  . TEE WITHOUT CARDIOVERSION N/A 03/11/2017   Procedure: TRANSESOPHAGEAL ECHOCARDIOGRAM (TEE);  Surgeon: Acie Fredrickson, Wonda Cheng, MD;  Location[facility] END[facility];  Service: Cardiovascular;  Laterality: N/A;  . TOTAL HIP ARTHROPLASTY     HPI:  Pt is an 85 yo female presenting with reports of being unable to care for herself at home after leaving SNF, where she felt like her care was substandard. She had a recent hospital stay 11/9-12/8 after having a large hiatal hernia with gastric outlet obstruction s/p laparoscopic gastric plexi on 11/13 with placement of g-tube and lysis of adhesions. Pt was on a diet of regular solids and thin liquids upon discharge. PMH also includes:  HTN, HLD, PAF, CVA   Assessment / Plan / Recommendation Clinical Impression  Pt has no overt signs of dysphagia nor aspiration. Per chart review and discussion with pt, it appears as though the g-tube placement was related to GI issues from recent admission as opposed to any oropharyngeal dysphagia. Regular solids and thin liquids recommended with use of esophageal precautions. SLP to sign off at this time, but please reorder if needed.  SLP Visit Diagnosis: Dysphagia, unspecified (R13.10)    Aspiration Risk  No limitations    Diet Recommendation  Regular;Thin liquid   Liquid Administration via: Cup;Straw Medication Administration: Whole meds with liquid Supervision: Patient able to self feed;Intermittent supervision to cue  for compensatory strategies Compensations: Slow rate;Small sips/bites Postural Changes: Remain upright for at least 30 minutes after po intake;Seated upright at 90 degrees    Other  Recommendations Oral Care Recommendations: Oral care BID   Follow up Recommendations None      Frequency and Duration            Prognosis Prognosis for Safe Diet Advancement: Good      Swallow Study   General HPI: Pt is an 85 yo female presenting with reports of being unable to care for herself at home after leaving SNF, where she felt like her care was substandard. She had a recent hospital stay 11/9-12/8 after having a large hiatal hernia with gastric outlet obstruction s/p laparoscopic gastric plexi on 11/13 with placement of g-tube and lysis of adhesions. Pt was on a diet of regular solids and thin liquids upon discharge. PMH also includes:  HTN, HLD, PAF, CVA Type of Study: Bedside Swallow Evaluation Previous Swallow Assessment: none in chart Diet Prior to this Study: NPO Temperature Spikes Noted: No Respiratory Status: Nasal cannula History of Recent Intubation: No Behavior/Cognition: Alert;Cooperative;Requires cueing Oral Cavity Assessment: Dry Oral Care Completed by SLP: No Oral Cavity - Dentition: Adequate natural dentition;Missing dentition Vision: Functional for self-feeding Self-Feeding Abilities: Able to feed self Patient Positioning: Upright in bed Baseline Vocal Quality: Normal Volitional Swallow: Able to elicit    Oral/Motor/Sensory Function Overall Oral Motor/Sensory Function: Within functional limits   Ice Chips Ice chips: Not tested   Thin Liquid Thin Liquid: Within functional limits Presentation: Cup;Self Fed;Straw    Nectar Thick Nectar Thick Liquid: Not tested   Honey Thick Honey Thick Liquid: Not tested   Puree Puree: Within functional limits Presentation: Self Fed;Spoon   Solid     Solid: Within functional limits Presentation: Self Alyssa Odonnell  Alyssa Odonnell 10/02/2019,10:37 AM  Alyssa Odonnell, M.A. [place] Acute Environmental education officer [phone] Office [phone]

## 2019-10-02 NOTE — Progress Notes (Signed)
Lower extremity venous doppler has been completed, results are located under CV Proc.  Natthew Marlatt RDCS 

## 2019-10-02 NOTE — Progress Notes (Signed)
Initial Nutrition Assessment  DOCUMENTATION CODES:   Non-severe (moderate) malnutrition in context of chronic illness  INTERVENTION:   -Continue liberalized diet of regular to promote adequate PO intake -Boost Breeze po TID, each supplement provides 250 kcal and 9 grams of protein -MVI with minerals daily -Magic cup TID with meals, each supplement provides 290 kcal and 9 grams of protein -RD personally obtained lunch and dinner orders as well as preferences and entered into [employer] meal ordering system; changed pt's meal ordering status to "with assist" so nutritional services ambassador can assist pt with making meal selections   NUTRITION DIAGNOSIS:   Moderate Malnutrition related to chronic illness(hiatal hernia with GOO) as evidenced by energy intake < or equal to 75% for > or equal to 1 month, mild fat depletion, moderate fat depletion, mild muscle depletion, moderate muscle depletion.  GOAL:   Patient will meet greater than or equal to 90% of their needs  MONITOR:   PO intake, Supplement acceptance, Labs, Weight trends, Skin, I & O's  REASON FOR ASSESSMENT:   Consult Assessment of nutrition requirement/status  ASSESSMENT:   Alyssa Odonnell is a 85 y.o. female with medical history significant of hypertension, hyperlipidemia, hiatal hernia with gastric outlet obstruction, PAF on Eliquis, and CVA.  Patient presents with reports of being unable to care for herself. She was recently hospitalized from 11/9-12/8 after having a large hiatal hernia with gastric outlet obstruction and volvulus.  Patient had a prolonged hospitalization requiring laparoscopic gastric plexi on 09/01/2019 with placement of gastrostomy tube and lysis of adhesions.  Subsequently, she developed acute ischemic left fourth and fifth toes requiring arteriogram and stent to the left SFA and balloon angioplasty of the trunk on 11/23 by vascular surgery.  Following the procedure patient had a groin hematoma  requiring transfusion of 2 units of packed red blood cells.  She was initially sent to a skilled nursing facility, but reports that staff were rude and were not giving adequate care.  Therefore patient left after being there when they came home.  She lives at home alone and was utilizing a walker to ambulate.  After getting home she was told that people would come out to help and assist her, but no one ever came.  She complains of being constantly nauseous and has significant shortness of breath with exertion.  Leaning to either side also worsens shortness of breath symptoms.  She reports that she has been nauseous, and only able to eat soup.  Pt admitted with adult failure to thrive.   Reviewed I/O's: -420 ml x 24 hours  Spoke with pt at bedside, who reports that she had experienced a general decline in health over the past month, since undergoing extensive hospitalization for hiatal hernia. Pt reports her appetite has been poor, secondary to lack of desire to eat. During previous hospitalization, pt reports food was unappetizing her her and mostly consumed soups and saltine crackers. She has tried to consume Ensure supplements, however, she did not tolerate them well (often caused diarrhea after consuming).   Pt reports that she usually weighs about 140#. She knows she has lost weight, however, is unsure about amount or time frame for weight loss. Pt shares that she "can feel it in her body" and that she has become much more weak. Per pt, prior to previous hospital admission, she was able to garden with assistance of her walker. She now "can barely walk", which she attributes to "my left legs feeling like it weighs 100#". Reviewed  wt hx, which reveals a 5.6% wt loss over the past week, which is significant for time frame.   Pt has not eaten anything today per her report, other than half container of jello and half of a graham cracker. She reports this is the first time in a long time that she has desired  to eat anything. RD reviewed menu selections with her and obtained food preferences. RD personally obtained meal order for lunch and dinner and entered into healthtouch system. Also changed pt's meal ordering status to "with assist" so nutritional services ambassador can assist pt with making menu selections.   Pt confirms that g-tube was placed for drainage last admission due to hiatal hernia with GOO. This was not intended to use for feeding administration.  Discussed with pt importance of good meal and supplement intake to promote healing.   Medications reviewed and include 0.9% sodium chloride infusion.   Labs reviewed: K: 3.3.   NUTRITION - FOCUSED PHYSICAL EXAM:    Most Recent Value  Orbital Region  Moderate depletion  Upper Arm Region  Moderate depletion  Thoracic and Lumbar Region  Mild depletion  Buccal Region  Mild depletion  Temple Region  Mild depletion  Clavicle Bone Region  Moderate depletion  Clavicle and Acromion Bone Region  Moderate depletion  Scapular Bone Region  Moderate depletion  Dorsal Hand  Moderate depletion  Patellar Region  Mild depletion  Anterior Thigh Region  Mild depletion  Posterior Calf Region  Mild depletion  Edema (RD Assessment)  Mild  Hair  Reviewed  Eyes  Reviewed  Mouth  Reviewed  Skin  Reviewed  Nails  Reviewed       Diet Order:   Diet Order            Diet regular Room service appropriate? Yes with Assist; Fluid consistency: Thin  Diet effective now              EDUCATION NEEDS:   Education needs have been addressed  Skin:  Skin Assessment: Skin Integrity Issues: Skin Integrity Issues:: Stage II, Incisions Stage II: sacrum Incisions: closed abdomen  Last BM:  09/30/19  Height:   Ht Readings from Last 1 Encounters:  09/30/19 5\' 3"  (1.6 m)    Weight:   Wt Readings from Last 1 Encounters:  09/30/19 69.3 kg    Ideal Body Weight:  52.3 kg  BMI:  Body mass index is 27.06 kg/m.  Estimated Nutritional Needs:    Kcal:  1550-1750  Protein:  75-90 grams  Fluid:  > 1.5 L    [name] A. Jimmye Norman, RD, LDN, [place] Registered Dietitian II Certified Diabetes Care and Education Specialist Pager: [phone] After hours Pager: [phone]

## 2019-10-02 NOTE — Social Work (Signed)
CSW acknowledging consult for outpatient Palliative follow up. Will request that she receive this at SNF once she has made her choice. Insurance [name] has be submitted.   CSW continuing to follow for support with disposition when medically appropriate.  Westley Hummer, MSW, [place] Work [phone]

## 2019-10-02 NOTE — Consult Note (Signed)
Consultation Note Date: 10/02/2019   Patient Name: Alyssa Odonnell  DOB: 1934/08/10  MRN: [id number]  Age / Sex: 85 y.o., female  PCP: Drake Leach, MD Referring Physician: Cristal Ford, DO  Reason for Consultation: Establishing goals of care  HPI/Patient Profile: Alyssa Ferrante Griffinis a 85 y.o.femalewith medical history significant ofhypertension, hyperlipidemia, hiatal hernia with gastric outlet obstruction, PAF on Eliquis, andCVA. Admitted on 09/30/2019 with failure to thrive and generalized weakness.  Clinical Assessment and Goals of Care: Met with patient at bedside after completing a chart review. Patient was very tearful upon arrival as she shared her daughter is a Bureau dying of ETOH cirrhosis. The medical team plans to extubate in the near future. We discussed her daughters addiction and her importance in Alyssa Odonnell life. Alyssa Odonnell said that it has been a rocky road with her daughter due to her alcoholism. She is estranged from her son due to her feelings of "being used" by him.   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   Alyssa Odonnell has been married twice and was living independently with her daughter, Alyssa Odonnell at home before her recently decline. She states that since then she has not felt a need to eat. She attests that her goal is to get stronger however that may be. She is open to going back to skilled nursing for this though requests that she does not go to the same facility as before.   We discussed code status for which she said she would want CPR and shocks as an effort. She would not want intubation though at present would want transfer to ICU if needed. She verbalized that she would not want to live in a vegetative state. Reviewed with her my fears of doing CPR on her though she stated this is something that she really would want.  Will continue to explore this and complete a MOST form prior to discharge.    SUMMARY OF RECOMMENDATIONS   - Continue to discuss goals - Complete MOST prior to discharge - Chaplain consult - Social work consult  Code Status/Advance Care Planning:  Limited code   Symptom Management:   Constipation:  Senokot 1 tab PO QHS  Nausea:   Zofran 74m PO/IV Q6H PRN  Generalized Pain 2/2 debility:  Tylenol 6580mPO Q8H ATC  Lidocaine patch  Would be cautious with the use of flexeril given it's side effect profile in geriatric patients  PT/OT consults   Palliative Prophylaxis:   Bowel Regimen, Delirium Protocol and Frequent Pain Assessment  Additional Recommendations (Limitations, Scope, Preferences):  Full Scope Treatment  Psycho-social/Spiritual:   Desire for further Chaplaincy support: YES  Additional Recommendations: Referral to Community Resources   Prognosis:   Unable to determine  Discharge Planning: [name] with Palliative care service follow-up      Primary Diagnoses: Present on Admission: . Failure to thrive in adult . Pleural effusion, bilateral . PAF ([place]) [place]. Pressure injury of  skin . Malnutrition ([place]) . PAD (peripheral artery disease) ([place]) . Hypothyroid  I have reviewed the medical record, interviewed the patient and family, and examined the patient. The following aspects are pertinent.  Past Medical History:  Diagnosis Date  . Gastric outlet obstruction 08/2019  . Glaucoma   . Hyperlipidemia   . Hypertension   . Hypothyroid   . PAF (paroxysmal atrial fibrillation) ([place])   . Stroke [facility])    Social History   Socioeconomic History  . Marital status: Widowed    Spouse name: Not on file  . Number of children: Not on file  . Years of education: Not on file  . Highest education level: Not on file  Occupational History  . Not on file  Tobacco Use  . Smoking status: Never  Smoker  . Smokeless tobacco: Never Used  Substance and Sexual Activity  . Alcohol use: No  . Drug use: No  . Sexual activity: Not on file  Other Topics Concern  . Not on file  Social History Narrative  . Not on file   Social Determinants of Health   Financial Resource Strain:   . Difficulty of Paying Living Expenses: Not on file  Food Insecurity:   . Worried About Charity fundraiser in the Last Year: Not on file  . Ran Out of Food in the Last Year: Not on file  Transportation Needs:   . Lack of Transportation (Medical): Not on file  . Lack of Transportation (Non-Medical): Not on file  Physical Activity:   . Days of Exercise per Week: Not on file  . Minutes of Exercise per Session: Not on file  Stress:   . Feeling of Stress : Not on file  Social Connections:   . Frequency of Communication with Friends and Family: Not on file  . Frequency of Social Gatherings with Friends and Family: Not on file  . Attends Religious Services: Not on file  . Active Member of Clubs or Organizations: Not on file  . Attends Archivist Meetings: Not on file  . Marital Status: Not on file   Family History  Problem Relation Age of Onset  . Stroke Father    Scheduled Meds: . amiodarone  200 mg Oral Daily  . apixaban  5 mg Oral BID  . clopidogrel  75 mg Oral Q breakfast  . latanoprost  1 drop Both Eyes QHS  . levothyroxine  100 mcg Oral QAC breakfast  . lidocaine  1 patch Transdermal Q24H  . pantoprazole  40 mg Oral Daily  . sodium chloride flush  3 mL Intravenous Q12H   Continuous Infusions: PRN Meds:.acetaminophen **OR** acetaminophen, albuterol, cyclobenzaprine, ondansetron **OR** ondansetron (ZOFRAN) IV Medications Prior to Admission:  Prior to Admission medications   Medication Sig Start Date End Date Taking? Authorizing Provider  amiodarone (PACERONE) 200 MG tablet Take 1 tablet (200 mg total) by mouth daily. 09/26/19 10/26/19 Yes Donne Hazel, MD  clopidogrel (PLAVIX) 75  MG tablet Take 1 tablet (75 mg total) by mouth daily with breakfast. 09/26/19 10/26/19 Yes Donne Hazel, MD  ELIQUIS 5 MG TABS tablet TAKE 1 TABLET BY MOUTH TWO  TIMES DAILY Patient taking differently: Take 5 mg by mouth 2 (two) times daily.  03/14/19  Yes Evans Lance, MD  latanoprost (XALATAN) 0.005 % ophthalmic solution Place 1 drop into both eyes at bedtime. 05/11/16  Yes [provider]  levothyroxine (SYNTHROID, LEVOTHROID) 100 MCG tablet Take 100 mcg by mouth daily before  breakfast.  05/11/16  Yes [provider]  lidocaine (LIDODERM) 5 % Place 1 patch onto the skin daily. Remove & Discard patch within 12 hours or as directed by MD 09/25/19  Yes Donne Hazel, MD  omeprazole (PRILOSEC) 20 MG capsule Take 20 mg by mouth daily before breakfast. 08/05/16  Yes [provider]  triamcinolone cream (KENALOG) 0.1 % Apply 1 application topically daily as needed for itching.   Yes [provider]  cyclobenzaprine (FLEXERIL) 5 MG tablet Take 1 tablet (5 mg total) by mouth 3 (three) times daily. Patient not taking: Reported on 09/30/2019 09/25/19   Donne Hazel, MD  docusate sodium (COLACE) 100 MG capsule Take 1 capsule (100 mg total) by mouth daily. Patient not taking: Reported on 09/30/2019 09/26/19   Donne Hazel, MD  methocarbamol (ROBAXIN) 500 MG tablet Take 1 tablet (500 mg total) by mouth every 8 (eight) hours as needed for muscle spasms. Patient not taking: Reported on 09/30/2019 09/25/19   Donne Hazel, MD  traMADol (ULTRAM) 50 MG tablet Take 1-2 tablets (50-100 mg total) by mouth every 6 (six) hours as needed for moderate pain or severe pain. Patient not taking: Reported on 09/30/2019 09/25/19   Donne Hazel, MD   Allergies  Allergen Reactions  . Aspirin Other (See Comments)    Red splotches  . Atorvastatin Other (See Comments)    Muscle spams  . Nabumetone Itching  . Nsaids     Reaction (??)  . Pravastatin Other (See Comments)    Stiff  walking difficulty Stiff walking difficulty  . Procaine Swelling  . Rosuvastatin Nausea Only and Other (See Comments)    Reflux and Muscle stiffness  . Simvastatin Nausea Only and Other (See Comments)    Reflux and Muscle stiffness  . Statins Other (See Comments)  . Evolocumab Rash  . Mobic [Meloxicam] Rash  . Penicillins Rash    Has patient had a PCN reaction causing immediate rash, facial/tongue/throat swelling, SOB or lightheadedness with hypotension: Yes Has patient had a PCN reaction causing severe rash involving mucus membranes or skin necrosis: No Has patient had a PCN reaction that required hospitalization: No Has patient had a PCN reaction occurring within the last 10 years: No If all of the above answers are "NO", then may proceed with Cephalosporin use.     Review of Systems  Constitutional: Positive for activity change. Negative for fever.  Musculoskeletal: Positive for gait problem.    Physical Exam Vitals and nursing note reviewed.  HENT:     Head: Normocephalic.     Nose: Nose normal.     Mouth/Throat:     Mouth: Mucous membranes are moist.  Eyes:     Extraocular Movements: Extraocular movements intact.     Pupils: Pupils are equal, round, and reactive to light.  Cardiovascular:     Rate and Rhythm: Rhythm irregular.  Pulmonary:     Effort: Pulmonary effort is normal.  Abdominal:     Palpations: Abdomen is soft.  Musculoskeletal:        General: Normal range of motion.     Cervical back: Normal range of motion.  Skin:    General: Skin is warm and dry.     Capillary Refill: Capillary refill takes less than 2 seconds.  Neurological:     General: No focal deficit present.     Mental Status: She is alert.  Psychiatric:        Mood and Affect: Mood normal.  Vital Signs: BP 123/77 (BP Location: Left Arm)   Pulse 95   Temp 98.2 F (36.8 C) (Oral)   Resp 18   Ht '5\' 3"'  (1.6 m)   Wt 69.3 kg   SpO2 98%   BMI 27.06 kg/m  Pain Scale: 0-10   Pain  Score: 6   SpO2: SpO2: 98 % O2 Device:SpO2: 98 % O2 Flow Rate: .O2 Flow Rate (L/min): 2 L/min  IO: Intake/output summary:   Intake/Output Summary (Last 24 hours) at 10/02/2019 1334 Last data filed at 10/02/2019 0800 Gross per 24 hour  Intake 0 ml  Output --  Net 0 ml   LBM: Last BM Date: 09/30/19 Baseline Weight: Weight: 69.3 kg Most recent weight: Weight: 69.3 kg     Palliative Assessment/Data: 50%   Time In: 1400 Time Out: 1515 Time Total: 75 Greater than 50%  of this time was spent counseling and coordinating care related to the above assessment and plan.  Signed by: Rosezella Rumpf, NP    Please contact Palliative Medicine Team phone at [phone] for questions and concerns.  For individual provider: See Polly Cobia NP agrees with above assessment and plan

## 2019-10-02 NOTE — Evaluation (Signed)
Occupational Therapy Evaluation Patient Details Name: Alyssa Odonnell MRN: [id number] DOB: [birth year] Today's Date: 10/02/2019    History of Present Illness Alyssa Odonnell is a 85 y.o. female with medical history significant of hypertension, hyperlipidemia, hiatal hernia with gastric outlet obstruction, PAF on Eliquis, and CVA.  Patient presents with reports of being unable to care for herself. She was recently hospitalized from 11/9-12/8 after having a large hiatal hernia with gastric outlet obstruction and volvulus.  Patient had a prolonged hospitalization requiring laparoscopic gastric plexi on 09/01/2019 with placement of gastrostomy tube and lysis of adhesions.  Subsequently, she developed acute ischemic left fourth and fifth toes requiring arteriogram and stent to the left SFA and balloon angioplasty of the trunk on 11/23 by vascular surgery.  Following the procedure patient had a groin hematoma requiring transfusion of 2 units of packed red blood cells.  She was initially sent to a skilled nursing facility, but reports that staff were rude and were not giving adequate care.  Therefore patient left; lives home alone   Clinical Impression   Patient is an 85 year old female that lives alone in a two story home with 4 steps to enter. Patient uses a rollator at baseline and before hospitalization in November was modified independent with self care and functional transfers. Currently patient requires mod A for bed mobility, mod A with sit to stand, min/mod for side stepping to head of bed, and max A for LB self care. Patient has increased pain in R LE and reports her R leg has not fully recovered since her vascular surgery which patient is very concerned about. Patient has decreased strength, balance, activity tolerance therefore recommend continued acute OT services.      Follow Up Recommendations  SNF;Supervision/Assistance - 24 hour    Equipment Recommendations  Other (comment)(to be determined)        Precautions / Restrictions Precautions Precautions: Fall Precaution Comments: g tube Restrictions Weight Bearing Restrictions: No      Mobility Bed Mobility Overal bed mobility: Needs Assistance Bed Mobility: Supine to Sit;Sit to Supine     Supine to sit: Mod assist Sit to supine: Mod assist   General bed mobility comments: mod A for trunk support to sit, mod A to lift LEs back onto EOB  Transfers Overall transfer level: Needs assistance Equipment used: Rolling walker (2 wheeled) Transfers: Sit to/from Stand Sit to Stand: Mod assist         General transfer comment: ModA to power up, cues for hand placement    Balance Overall balance assessment: Needs assistance Sitting-balance support: Feet supported;No upper extremity supported Sitting balance-Leahy Scale: Good     Standing balance support: Bilateral upper extremity supported;During functional activity Standing balance-Leahy Scale: Poor Standing balance comment: reliant on external support                           ADL either performed or assessed with clinical judgement   ADL Overall ADL's : Needs assistance/impaired Eating/Feeding: NPO   Grooming: Set up;Wash/dry face;Sitting   Upper Body Bathing: Set up;Sitting   Lower Body Bathing: Maximal assistance;Sit to/from stand;Sitting/lateral leans   Upper Body Dressing : Set up;Sitting   Lower Body Dressing: Maximal assistance;Sitting/lateral leans;Total assistance;Sit to/from stand Lower Body Dressing Details (indicate cue type and reason): seated EOB patient unable to don B socks, fears falling of the bed and has arthritic knees making figure 4 position too challenging.  Toilet Transfer: Moderate assistance  Toilet Transfer Details (indicate cue type and reason): simulated sit to stand at edge of bed, decreased strength, activity tolerance and increased pain in knees Toileting- Clothing Manipulation and Hygiene: Moderate  assistance;Sitting/lateral lean;Sit to/from stand       Functional mobility during ADLs: Minimal assistance;Moderate assistance;Rolling walker General ADL Comments: patient reports having very little energy as she has not been able to eat anything for a long time                  Pertinent Vitals/Pain Pain Assessment: Faces Faces Pain Scale: Hurts even more Pain Location: R knee Pain Descriptors / Indicators: Aching;Discomfort Pain Intervention(s): Monitored during session     Hand Dominance Right   Extremity/Trunk Assessment Upper Extremity Assessment Upper Extremity Assessment: Generalized weakness   Lower Extremity Assessment Lower Extremity Assessment: Defer to PT evaluation       Communication Communication Communication: No difficulties   Cognition Arousal/Alertness: Awake/alert Behavior During Therapy: WFL for tasks assessed/performed;Anxious Overall Cognitive Status: Within Functional Limits for tasks assessed                                 General Comments: tangential   General Comments  2L O2 briefly drops into 80s with side stepping, min cues for pursed lip breathing and quickly recovers to 92-93%    Exercises Exercises: General Lower Extremity General Exercises - Lower Extremity Ankle Circles/Pumps: 10 reps;Both;AROM;Supine Heel Slides: 5 reps;Both;Other (comment);Supine(AAROM R LE, AROM L LE)        Home Living Family/patient expects to be discharged to:: Private residence Living Arrangements: Alone Available Help at Discharge: Family;Available PRN/intermittently Type of Home: House Home Access: Stairs to enter [employer] of Steps: 4 Entrance Stairs-Rails: Right Home Layout: Two level;Able to live on main level with bedroom/bathroom     Bathroom Shower/Tub: Tub/shower unit         Home Equipment: Walker - 2 wheels;Walker - 4 wheels;Bedside commode;Cane - single point   Additional Comments: Daughter with  health problems and not able to physically assist; she was at home after leaving a SNF from previous episode of care for approx 1 day, and was unable to care for herself      Prior Functioning/Environment Level of Independence: Independent with assistive device(s);Needs assistance  Gait / Transfers Assistance Needed: walked with RW prior to previous episode of care which started in November ADL's / Homemaking Assistance Needed: at baseline patient reports she gardens, mod I with all self care            OT Problem List: Decreased strength;Decreased activity tolerance;Impaired balance (sitting and/or standing);Decreased safety awareness;Pain      OT Treatment/Interventions: Self-care/ADL training;Therapeutic exercise;Energy conservation;DME and/or AE instruction;Therapeutic activities;Patient/family education;Balance training    OT Goals(Current goals can be found in the care plan section) Acute Rehab OT Goals Patient Stated Goal: Get to a rehab center where she will get good care OT Goal Formulation: With patient Time For Goal Achievement: 10/16/19 Potential to Achieve Goals: Good  OT Frequency: Min 2X/week    AM-PAC OT "6 Clicks" Daily Activity     Outcome Measure Help from another person eating meals?: None Help from another person taking care of personal grooming?: A Little Help from another person toileting, which includes using toliet, bedpan, or urinal?: A Lot Help from another person bathing (including washing, rinsing, drying)?: A Lot Help from another person to put on and taking off regular upper  body clothing?: A Little Help from another person to put on and taking off regular lower body clothing?: A Lot 6 Click Score: 16   End of Session Equipment Utilized During Treatment: Rolling walker;Gait belt;Oxygen Nurse Communication: Mobility status  Activity Tolerance: Patient tolerated treatment well Patient left: in bed;with call bell/phone within reach;with bed alarm  set  OT Visit Diagnosis: Unsteadiness on feet (R26.81);Muscle weakness (generalized) (M62.81);Pain Pain - Right/Left: Right Pain - part of body: Knee                Time: [id number] OT Time Calculation (min): 34 min Charges:  OT General Charges $OT Visit: 1 Visit OT Evaluation $OT Eval Moderate Complexity: 1 Mod OT Treatments $Self Care/Home Management : 8-22 mins  Shon Millet OT OT office: [place] 10/02/2019, 12:22 PM

## 2019-10-02 NOTE — Progress Notes (Signed)
PROGRESS NOTE    JHOURNI CARLE  [id number] DOB: Nov 06, 1933 DOA: 09/30/2019 PCP: Drake Leach, MD   Brief Narrative:  HPI On 09/30/2019 by Dr. Fuller Plan Alyssa Odonnell is a 85 y.o. female with medical history significant of hypertension, hyperlipidemia, hiatal hernia with gastric outlet obstruction, PAF on Eliquis, and CVA.  Patient presents with reports of being unable to care for herself. She was recently hospitalized from 11/9-12/8 after having a large hiatal hernia with gastric outlet obstruction and volvulus.  Patient had a prolonged hospitalization requiring laparoscopic gastric plexi on 09/01/2019 with placement of gastrostomy tube and lysis of adhesions.  Subsequently, she developed acute ischemic left fourth and fifth toes requiring arteriogram and stent to the left SFA and balloon angioplasty of the trunk on 11/23 by vascular surgery.  Following the procedure patient had a groin hematoma requiring transfusion of 2 units of packed red blood cells.  She was initially sent to a skilled nursing facility, but reports that staff were rude and were not giving adequate care.  Therefore patient left after being there when they came home.  She lives at home alone and was utilizing a walker to ambulate.  After getting home she was told that people would come out to help and assist her, but no one ever came.  She complains of being constantly nauseous and has significant shortness of breath with exertion.  Leaning to either side also worsens shortness of breath symptoms.  She reports that she has been nauseous, and only able to eat soup.  Interim history Admitted for failure to thrive and generalized weakness Assessment & Plan   Failure to thrive/generalized weakness -Patient was discharged recently on 09/25/2019 to a nursing facility at which point patient decided to check herself out as she felt she was getting a poor care.  After returning home she noted she was unable to care for herself  and states she was not receiving help as she initially thought she would -PT recommended SNF -Pending OT consultation -TOC consulted  Dyspnea secondary to bilateral pleural effusions -CTA of the chest showed moderate bilateral pleural effusions left greater than the right -BNP 123.1 -Was given Lasix x1 dose, and breathing has improved -Echocardiogram 55 to 60%, mildly increased LV hypertrophy.  LV diastolic parameters are indeterminate.  Mild to moderate pericardial effusion no circumferential.  No evidence of tamponade physiology by echo -Will attempt to wean nasal cannula  Malnutrition -Albumin 2.8 on admission -Does have a G-tube however was recently discharged today heart healthy diet and the G-tube has not been used -Prealbumin 10 -Nutrition and speech therapy consulted, and pending  Elevated D-dimer -D-dimer 6.44 -CTA chest negative for pulmonary embolism -Will check lower extremity Dopplers- currently pending   Large hiatal hernia -Status post laparoscopic gastric plexi with lysis of adhesions, placement of G-tube on 09/01/2019 (PEG tube will have to be in place for at least 6 weeks prior to being removed) -As above, patient was not receiving tube feeds -Speech therapy consulted.  Of note, patient was discharged on 09/25/2019 to SNF on a heart healthy diet -Seen by speech therapy today, recommended regular, thin liquid diet  Paroxysmal atrial fibrillation -On chronic anticoagulation with Eliquis -Continue Eliquis and amiodarone  Peripheral vascular disease  -Patient was recently noted to have acute ischemic left fourth and fifth toes -Recent admission, patient was seen by vascular surgery.  Status post stenting of the left SFA on 09/11/2019 -Continue Plavix  Hypothyroidism -TSH 8.686 -Continue Synthroid -would recheck TSH in  2 weeks  GERD -Continue PPI  Chronic kidney disease, stage IIIb -Stable  Chronic normocytic anemia -hemoglobin appears stable  DVT  Prophylaxis Eliquis  Code Status: Full  Family Communication: None at bedside  Disposition Plan: Admitted.  Pending improvement in her symptoms.  Suspect SNF at discharge however pending OT evaluations  Consultants None  Procedures  Echocardiogram  Antibiotics   Anti-infectives (From admission, onward)   None      Subjective:   Alyssa Odonnell seen and examined today.  Patient very tearful this morning and states her daughter has [facility] to [facility].  She also states she feels very weak.  Upset that she has not eaten yet.  Denies current chest pain or shortness of breath, abdominal pain.  States she has chronic nausea.  Denies any vomiting.  Denies current dizziness or headache.  Objective:   Vitals:   10/01/19 0416 10/01/19 1415 10/01/19 2139 10/02/19 0518  BP: 132/64 138/78 (!) 125/102 123/77  Pulse: 92 89 95 95  Resp: 17 18    Temp: 98 F (36.7 C) 98.2 F (36.8 C) 99 F (37.2 C) 98.2 F (36.8 C)  TempSrc: Oral Oral Oral Oral  SpO2: 98% 97% 100% 98%  Weight:      Height:        Intake/Output Summary (Last 24 hours) at 10/02/2019 1150 Last data filed at 10/02/2019 0800 Gross per 24 hour  Intake 0 ml  Output --  Net 0 ml   Filed Weights   09/30/19 2200  Weight: 69.3 kg   Exam  General: Well developed,chroncially ill appearing, NAD  HEENT: NCAT, mucous membranes moist.   Cardiovascular: S1 S2 auscultated, RRR, SEM  Respiratory: Clear to auscultation bilaterally  Abdomen: Soft, nontender, nondistended, + bowel sounds, peg  Extremities: warm dry without cyanosis clubbing or edema  Neuro: AAOx3, nonfocal   Psych: Appropriate mood and affect   Data Reviewed: I have personally reviewed following labs and imaging studies  CBC: Recent Labs  Lab 09/30/19 1328 10/01/19 0316 10/02/19 0146  WBC 10.2 7.9 8.9  NEUTROABS 7.7  --   --   HGB 9.6* 8.7* 8.9*  HCT 31.4* 28.0* 28.7*  MCV 94.0 90.3 90.8  PLT 344 325 [id number]   Basic Metabolic  Panel: Recent Labs  Lab 09/30/19 1328 10/01/19 0316 10/02/19 0146  NA 137 138 137  K 3.7 3.3* 3.8  CL 102 99 100  CO2 22 25 24   GLUCOSE 103* 92 88  BUN 22 19 18   CREATININE 1.39* 1.34* 1.25*  CALCIUM 8.9 8.7* 8.6*   GFR: Estimated Creatinine Clearance: 31.9 mL/min (A) (by C-G formula based on SCr of 1.25 mg/dL (H)). Liver Function Tests: Recent Labs  Lab 09/30/19 1328  AST 12*  ALT 14  ALKPHOS 73  BILITOT 0.9  PROT 6.4*  ALBUMIN 2.8*   No results for input(s): LIPASE, AMYLASE in the last 168 hours. No results for input(s): AMMONIA in the last 168 hours. Coagulation Profile: No results for input(s): INR, PROTIME in the last 168 hours. Cardiac Enzymes: No results for input(s): CKTOTAL, CKMB, CKMBINDEX, TROPONINI in the last 168 hours. BNP (last 3 results) No results for input(s): PROBNP in the last 8760 hours. HbA1C: No results for input(s): HGBA1C in the last 72 hours. CBG: Recent Labs  Lab 09/25/19 1652 09/26/19 0000 09/26/19 0855  GLUCAP 128* 119* 99   Lipid Profile: No results for input(s): CHOL, HDL, LDLCALC, TRIG, CHOLHDL, LDLDIRECT in the last 72 hours. Thyroid Function Tests: Recent  Labs    10/01/19 0316  TSH 8.686*   Anemia Panel: No results for input(s): VITAMINB12, FOLATE, FERRITIN, TIBC, IRON, RETICCTPCT in the last 72 hours. Urine analysis:    Component Value Date/Time   COLORURINE STRAW (A) 09/30/2019 2057   APPEARANCEUR CLEAR 09/30/2019 2057   LABSPEC 1.010 09/30/2019 2057   PHURINE 6.0 09/30/2019 2057   GLUCOSEU NEGATIVE 09/30/2019 2057   HGBUR NEGATIVE 09/30/2019 2057   BILIRUBINUR NEGATIVE 09/30/2019 2057   KETONESUR NEGATIVE 09/30/2019 2057   PROTEINUR NEGATIVE 09/30/2019 2057   NITRITE NEGATIVE 09/30/2019 2057   LEUKOCYTESUR TRACE (A) 09/30/2019 2057   Sepsis Labs: @LABRCNTIP (procalcitonin:4,lacticidven:4)  ) Recent Results (from the past 240 hour(s))  SARS CORONAVIRUS 2 (TAT 6-24 HRS) Nasopharyngeal Nasopharyngeal Swab      Status: None   Collection Time: 09/25/19  5:20 PM   Specimen: Nasopharyngeal Swab  Result Value Ref Range Status   SARS Coronavirus 2 NEGATIVE NEGATIVE Final    Comment: (NOTE) SARS-CoV-2 target nucleic acids are NOT DETECTED. The SARS-CoV-2 RNA is generally detectable in upper and lower respiratory specimens during the acute phase of infection. Negative results do not preclude SARS-CoV-2 infection, do not rule out co-infections with other pathogens, and should not be used as the sole basis for treatment or other patient management decisions. Negative results must be combined with clinical observations, patient history, and epidemiological information. The expected result is Negative. Fact Sheet for Patients: SugarRoll.be Fact Sheet for Healthcare Providers: https://www.woods-mathews.com/ This test is not yet approved or cleared by the [place] FDA and  has been authorized for detection and/or diagnosis of SARS-CoV-2 by FDA under an Emergency Use Authorization (EUA). This EUA will remain  in effect (meaning this test can be used) for the duration of the COVID-19 declaration under Section 56 4(b)(1) of the Act, 21 U.S.C. section 360bbb-3(b)(1), unless the authorization is terminated or revoked sooner. Performed at [facility], [place] [address], [place], [place] [address]   SARS CORONAVIRUS 2 (TAT 6-24 HRS) Nasopharyngeal Nasopharyngeal Swab     Status: None   Collection Time: 09/30/19  3:17 PM   Specimen: Nasopharyngeal Swab  Result Value Ref Range Status   SARS Coronavirus 2 NEGATIVE NEGATIVE Final    Comment: (NOTE) SARS-CoV-2 target nucleic acids are NOT DETECTED. The SARS-CoV-2 RNA is generally detectable in upper and lower respiratory specimens during the acute phase of infection. Negative results do not preclude SARS-CoV-2 infection, do not rule out co-infections with other pathogens, and should not be used as the sole  basis for treatment or other patient management decisions. Negative results must be combined with clinical observations, patient history, and epidemiological information. The expected result is Negative. Fact Sheet for Patients: SugarRoll.be Fact Sheet for Healthcare Providers: https://www.woods-mathews.com/ This test is not yet approved or cleared by the [place] FDA and  has been authorized for detection and/or diagnosis of SARS-CoV-2 by FDA under an Emergency Use Authorization (EUA). This EUA will remain  in effect (meaning this test can be used) for the duration of the COVID-19 declaration under Section 56 4(b)(1) of the Act, 21 U.S.C. section 360bbb-3(b)(1), unless the authorization is terminated or revoked sooner. Performed at [facility], [place] [address] [place], [place] [address]       Radiology Studies: DG Chest 2 View  Result Date: 09/30/2019 CLINICAL DATA:  Shortness of breath EXAM: CHEST - 2 VIEW COMPARISON:  08/29/2019 FINDINGS: Heart size is mildly enlarged, stable. No pulmonary vascular congestion. Moderate bilateral pleural effusions  with associated bibasilar opacities. No pneumothorax. Degenerative changes of the shoulders. IMPRESSION: Moderate bilateral pleural effusions with associated bibasilar opacities, likely atelectasis. Electronically Signed   By: Davina Poke M.D.   On: 09/30/2019 16:10   CT Angio Chest PE W/Cm &/Or Wo Cm  Result Date: 09/30/2019 CLINICAL DATA:  Positive D-dimer. EXAM: CT ANGIOGRAPHY CHEST WITH CONTRAST TECHNIQUE: Multidetector CT imaging of the chest was performed using the standard protocol during bolus administration of intravenous contrast. Multiplanar CT image reconstructions and MIPs were obtained to evaluate the vascular anatomy. CONTRAST:  5mL OMNIPAQUE IOHEXOL 300 MG/ML  SOLN COMPARISON:  August 03, 2016. FINDINGS: Cardiovascular: Satisfactory opacification of the pulmonary  arteries to the segmental level. No evidence of pulmonary embolism. Normal heart size. Mild pericardial effusion is noted. Coronary artery calcifications noted. Atherosclerosis of thoracic aorta is noted without aneurysm formation. Mediastinum/Nodes: Thyroid gland is unremarkable. No adenopathy is noted. Large sliding-type hiatal hernia is noted. Lungs/Pleura: No pneumothorax is noted. Moderate bilateral pleural effusions are noted, left greater than right, with associated atelectasis of both lower lobes. Upper Abdomen: No acute abnormality. Musculoskeletal: No chest wall abnormality. No acute or significant osseous findings. Review of the MIP images confirms the above findings. IMPRESSION: 1. No definite evidence of pulmonary embolus. 2. Coronary artery calcifications are noted suggesting coronary artery disease. 3. Large sliding-type hiatal hernia is noted. 4. Moderate bilateral pleural effusions are noted, left greater than right, with associated atelectasis of both lower lobes. 5. Aortic atherosclerosis. Aortic Atherosclerosis (ICD10-I70.0). Electronically Signed   By: Marijo Conception M.D.   On: 09/30/2019 16:06   ECHOCARDIOGRAM COMPLETE  Result Date: 10/01/2019   ECHOCARDIOGRAM REPORT   Patient Name:   Alyssa Odonnell Date of Exam: 10/01/2019 Medical Rec #:  [id number]      Height:       63.0 in Accession #:    [id number]     Weight:       152.8 lb Date of Birth:  [birth year]      BSA:          1.72 m Patient Age:    42 years       BP:           132/64 mmHg Patient Gender: F              HR:           84 bpm. Exam Location:  Inpatient Procedure: 2D Echo Indications:    Congestive Heart Failure 428.0/I50.9  History:        Patient has prior history of Echocardiogram examinations, most                 recent 03/11/2017. Arrythmias:Atrial Fibrillation; Risk                 Factors:Hypertension and Dyslipidemia. Hx CVA.  Sonographer:    Clayton Lefort RDCS (AE) Referring Phys: [id number] RONDELL A SMITH IMPRESSIONS  1.  Left ventricular ejection fraction, by visual estimation, is 55 to 60%. The left ventricle has normal function. There is mildly increased left ventricular hypertrophy.  2. Left ventricular diastolic parameters are indeterminate.  3. The left ventricle has no regional wall motion abnormalities.  4. Global right ventricle has normal systolic function.The right ventricular size is normal. No increase in right ventricular wall thickness.  5. Left atrial size was normal.  6. Right atrial size was normal.  7. Moderate pericardial effusion.  8. The pericardial effusion is circumferential.  9. Mild to moderate  pericardial effusion that is circumferential. Mild around the LV and RV, moderate adjacent to the RA. There is borderline respiratory variation approximately 20% across the MV, RA indentation without clear collapse. No RV collapse. Normal IVC. Collectively there is not evidence of tamponade physiology by echo. 10. The mitral valve is normal in structure. No evidence of mitral valve regurgitation. No evidence of mitral stenosis. 11. The tricuspid valve is normal in structure. Tricuspid valve regurgitation is trivial. 12. The aortic valve is tricuspid. Aortic valve regurgitation is not visualized. Mild aortic valve stenosis. 13. The pulmonic valve was not well visualized. Pulmonic valve regurgitation is not visualized. 14. Normal pulmonary artery systolic pressure. FINDINGS  Left Ventricle: Left ventricular ejection fraction, by visual estimation, is 55 to 60%. The left ventricle has normal function. The left ventricle has no regional wall motion abnormalities. There is mildly increased left ventricular hypertrophy. Left ventricular diastolic parameters are indeterminate. Right Ventricle: The right ventricular size is normal. No increase in right ventricular wall thickness. Global RV systolic function is has normal systolic function. The tricuspid regurgitant velocity is 2.38 m/s, and with an assumed right atrial  pressure  of 3 mmHg, the estimated right ventricular systolic pressure is normal at 25.8 mmHg. Left Atrium: Left atrial size was normal in size. Right Atrium: Right atrial size was normal in size Pericardium: A moderately sized pericardial effusion is present. The pericardial effusion is circumferential. Mild to moderate pericardial effusion that is circumferential. Mild around the LV and RV, moderate adjacent to the RA. There is borderline respiratory variation approximately 20% across the MV, RA indentation without clear collapse. No RV collapse. Normal IVC. Collectively there is not evidence of tamponade physiology by echo. Mitral Valve: The mitral valve is normal in structure. No evidence of mitral valve regurgitation. No evidence of mitral valve stenosis by observation. MV peak gradient, 3.1 mmHg. Tricuspid Valve: The tricuspid valve is normal in structure. Tricuspid valve regurgitation is trivial. Aortic Valve: The aortic valve is tricuspid. Aortic valve regurgitation is not visualized. Mild aortic stenosis is present. Moderate aortic valve annular calcification. There is moderate calcification of the aortic valve. Aortic valve mean gradient measures 10.2 mmHg. Aortic valve peak gradient measures 19.6 mmHg. Aortic valve area, by VTI measures 1.73 cm. Pulmonic Valve: The pulmonic valve was not well visualized. Pulmonic valve regurgitation is not visualized. Pulmonic regurgitation is not visualized. No evidence of pulmonic stenosis. Aorta: The aortic root is normal in size and structure. Pulmonary Artery: Indeterminant PASP, inadequate TR jet. IAS/Shunts: No atrial level shunt detected by color flow Doppler.  LEFT VENTRICLE PLAX 2D LVIDd:         3.60 cm  Diastology LVIDs:         2.30 cm  LV e' lateral:   8.70 cm/s LV PW:         1.20 cm  LV E/e' lateral: 7.8 LV IVS:        1.00 cm  LV e' medial:    7.62 cm/s LVOT diam:     2.10 cm  LV E/e' medial:  8.9 LV SV:         36 ml LV SV Index:   20.48 LVOT Area:      3.46 cm  RIGHT VENTRICLE             IVC RV Basal diam:  1.90 cm     IVC diam: 1.40 cm RV S prime:     12.10 cm/s LEFT ATRIUM  Index       RIGHT ATRIUM          Index LA diam:        3.30 cm 1.91 cm/m  RA Area:     9.40 cm LA Vol (A2C):   59.6 ml 34.56 ml/m RA Volume:   14.90 ml 8.64 ml/m LA Vol (A4C):   32.7 ml 18.96 ml/m LA Biplane Vol: 45.8 ml 26.56 ml/m  AORTIC VALVE AV Area (Vmax):    1.64 cm AV Area (Vmean):   1.57 cm AV Area (VTI):     1.73 cm AV Vmax:           221.20 cm/s AV Vmean:          148.000 cm/s AV VTI:            0.361 m AV Peak Grad:      19.6 mmHg AV Mean Grad:      10.2 mmHg LVOT Vmax:         104.97 cm/s LVOT Vmean:        67.100 cm/s LVOT VTI:          0.180 m LVOT/AV VTI ratio: 0.50  AORTA Ao Root diam: 2.70 cm MITRAL VALVE                        TRICUSPID VALVE MV Area (PHT): 3.42 cm             TR Peak grad:   22.8 mmHg MV Peak grad:  3.1 mmHg             TR Vmax:        242.00 cm/s MV Mean grad:  1.0 mmHg MV Vmax:       0.89 m/s             SHUNTS MV Vmean:      51.6 cm/s            Systemic VTI:  0.18 m MV VTI:        0.20 m               Systemic Diam: 2.10 cm MV PHT:        64.24 msec MV Decel Time: 222 msec MV E velocity: 68.15 cm/s 103 cm/s MV A velocity: 57.85 cm/s 70.3 cm/s MV E/A ratio:  1.18       1.5  Carlyle Dolly MD Electronically signed by Carlyle Dolly MD Signature Date/Time: 10/01/2019/12:20:09 PM    Final      Scheduled Meds: . amiodarone  200 mg Oral Daily  . apixaban  5 mg Oral BID  . clopidogrel  75 mg Oral Q breakfast  . latanoprost  1 drop Both Eyes QHS  . levothyroxine  100 mcg Oral QAC breakfast  . lidocaine  1 patch Transdermal Q24H  . pantoprazole  40 mg Oral Daily  . sodium chloride flush  3 mL Intravenous Q12H   Continuous Infusions: . sodium chloride 75 mL/hr at 10/02/19 0641     LOS: 2 days   Time Spent in minutes   45 minutes  Angelin Cutrone D.O. on 10/02/2019 at 11:50 AM  Between 7am to 7pm - Please see pager  noted on [email or web address]  After 7pm go to [email or web address]  And look for the night coverage person covering for me after hours  [facility] Office  [phone]

## 2019-10-03 DIAGNOSIS — Z66 Do not resuscitate: Secondary | ICD-10-CM

## 2019-10-03 DIAGNOSIS — R531 Weakness: Secondary | ICD-10-CM

## 2019-10-03 DIAGNOSIS — Z515 Encounter for palliative care: Secondary | ICD-10-CM

## 2019-10-03 MED ORDER — PNEUMOCOCCAL VAC POLYVALENT 25 MCG/0.5ML IJ INJ: 0.5000 mL | INJECTION | INTRAMUSCULAR | Status: DC | PRN

## 2019-10-03 NOTE — Plan of Care (Signed)

## 2019-10-03 NOTE — Progress Notes (Signed)
  Palliative Medicine Inpatient Consult Follow Up Note  This NP reviewed patients chart. Met with Alyssa Odonnell this morning. She stated that her appetite has improved. We talked about her present health situation she feels that she is "stronger" today than yesterday. We discussed her right knee pain for which she stated the tylenol has not helped. We talked about starting some analgesic gel to see if that improves the pain at all which she was open to though she has a h/o of intolerance to NSAIDs so we will continue to utilize lidocaine. Also discussed additional compression with movement, which is a modality that has helped in the past.   We also continue our conversation from yesterday regarding her wishes for herself. 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 We talked extensively about her frailty and the possibility of a code situation going poorly. After her thinking more she identified that she does not feel having CPR is congruent with what she would want for herself. We discussed the intricacies of the MOST form. She identified that she would not want chest compressions, would not want intubation, would want antibiotics and treatment is something could be improved upon. Talked about her already in place feeding for which she stated she would not have wanted if she would be reliant upon it for nutrition. She asked when it is "coming out" as she has not used it per her understanding. I told her that we would try to get in touch with her surgeon to better understand this.   Patient expressed her concern over her daughter who is getting closer to passing away. She stated that she hopes her grandson and son in law are able to see her. Therapeutic listening provided.   Discussed overall plan of care and treatment options, ensuring decisions are within the context of the patients  values and GOCs.  We will continue to follow along to offer assistance as able  Plan:   Constipation: ? Senokot 1 tab PO QHS  Nausea:  ? Zofran '4mg'$  PO/IV Q6H PRN  Generalized Pain 2/2 debility: ? Stop Tylenol '650mg'$  PO Q8H ATC ?  ? Lidocaine patch ? Would be cautious with the use of flexeril given it's side effect profile in geriatric patients ? PT/OT consults  Advanced Care Planning: - Continue to address [place], presently DNAR/DNI  Dispo: - Patients greatest wish is to go home, she is open to rehabilitation for strengthening at the present time.   Total time spent coordinating care 45 minutes Greater than 50% of the time was spent in counseling and coordination of care  Tacey Ruiz, NP  Palliative Medicine Team Team Phone # [phone]  Wadie Lessen NP agrees with above assessment and plan

## 2019-10-03 NOTE — Progress Notes (Signed)
Physical Therapy Treatment Patient Details Name: Alyssa Odonnell MRN: [id number] DOB: April 03, 1934 Today's Date: 10/03/2019    History of Present Illness Alyssa Odonnell is a 85 y.o. female with medical history significant of hypertension, hyperlipidemia, hiatal hernia with gastric outlet obstruction, PAF on Eliquis, and CVA.  Patient presents with reports of being unable to care for herself. She was recently hospitalized from 11/9-12/8 after having a large hiatal hernia with gastric outlet obstruction and volvulus.  Patient had a prolonged hospitalization requiring laparoscopic gastric plexi on 09/01/2019 with placement of gastrostomy tube and lysis of adhesions.  Subsequently, she developed acute ischemic left fourth and fifth toes requiring arteriogram and stent to the left SFA and balloon angioplasty of the trunk on 11/23 by vascular surgery.  Following the procedure patient had a groin hematoma requiring transfusion of 2 units of packed red blood cells.  She was initially sent to a skilled nursing facility, but reports that staff were rude and were not giving adequate care.  Therefore patient left; lives home alone    PT Comments    Pt in bed upon PT arrival, appearing less distraught at this time over daughter's medical condition, agreeable to PT at this time. The pt was able to demonstrate improvements in bed mobility and transfers as she needed less assistance today to complete both. However, the pt continues to need extra time for most movements, and has limited endurance for ambulation and activity. The pt also needed 2L O2 to maintain SpO2 > 93% with mobility, when the pt went to the bathroom without O2, SpO2 dropped to 85%, returned to 98 within 1 min of seated rest with 2L of O2 re-applied. The pt will continue to benefit from skilled PT to address functional mobility and independence.     Follow Up Recommendations  SNF;Supervision for mobility/OOB     Equipment Recommendations  None  recommended by PT    Recommendations for Other Services       Precautions / Restrictions Precautions Precautions: Fall Precaution Comments: g tube Restrictions Weight Bearing Restrictions: No    Mobility  Bed Mobility Overal bed mobility: Needs Assistance Bed Mobility: Supine to Sit;Sit to Supine     Supine to sit: Supervision     General bed mobility comments: pt able to transition to sitting EOB without assist.  Transfers Overall transfer level: Needs assistance Equipment used: Rolling walker (2 wheeled) Transfers: Sit to/from Stand Sit to Stand: Min guard         General transfer comment: pt benefits from sig extra time, but did not need physical assist to stand or lower to surfaces  Ambulation/Gait Ambulation/Gait assistance: Min guard;Supervision Gait Distance (Feet): 15 Feet(15 ft x2 in room) Assistive device: Rolling walker (2 wheeled) Gait Pattern/deviations: Step-through pattern;Decreased stride length;Shuffle;Trunk flexed Gait velocity: Decreased Gait velocity interpretation: <1.31 ft/sec, indicative of household ambulator General Gait Details: Pt with sig trunk flexion despite cues, ambulates without O2 to bathroom, then completed standing at the sink for pericare, then return to room, SpO2 85 upon return to room without O2, pt given 2L O2.   Stairs             Wheelchair Mobility    Modified Rankin (Stroke Patients Only)       Balance Overall balance assessment: Needs assistance Sitting-balance support: Feet supported;No upper extremity supported Sitting balance-Leahy Scale: Good Sitting balance - Comments: supervision with feet supported   Standing balance support: Bilateral upper extremity supported;During functional activity Standing balance-Leahy Scale: Poor Standing  balance comment: reliant on external support                            Cognition Arousal/Alertness: Awake/alert Behavior During Therapy: WFL for tasks  assessed/performed;Anxious Overall Cognitive Status: Within Functional Limits for tasks assessed                                 General Comments: Pt very tearful due to daughters prognosis (daughter currently hospitalized at [facility]), but redirectable this afternoon      Exercises      General Comments        Pertinent Vitals/Pain Pain Assessment: Faces Faces Pain Scale: Hurts even more Pain Location: R knee Pain Descriptors / Indicators: Aching;Discomfort Pain Intervention(s): Limited activity within patient's tolerance;Monitored during session;Repositioned    Home Living                      Prior Function            PT Goals (current goals can now be found in the care plan section) Acute Rehab PT Goals Patient Stated Goal: Get to a rehab center where she will get good care PT Goal Formulation: With patient Time For Goal Achievement: 10/15/19 Potential to Achieve Goals: Fair Progress towards PT goals: Progressing toward goals    Frequency    Min 2X/week      PT Plan Current plan remains appropriate    Co-evaluation              AM-PAC PT "6 Clicks" Mobility   Outcome Measure  Help needed turning from your back to your side while in a flat bed without using bedrails?: A Little Help needed moving from lying on your back to sitting on the side of a flat bed without using bedrails?: A Little Help needed moving to and from a bed to a chair (including a wheelchair)?: A Little Help needed standing up from a chair using your arms (e.g., wheelchair or bedside chair)?: A Little Help needed to walk in hospital room?: A Little Help needed climbing 3-5 steps with a railing? : A Lot 6 Click Score: 17    End of Session Equipment Utilized During Treatment: Gait belt;Oxygen(pt on wall O2 and states she did not use all the time. O2 removed and SpO2 remained >93 at rest, pt then ambulated to bathroom and SpO2 dropped to 85%. Pt will need O2 to  ambulate in future.) Activity Tolerance: Patient limited by fatigue Patient left: in chair;with call bell/phone within reach;with chair alarm set;with nursing/sitter in room Nurse Communication: Mobility status(desire for bath) PT Visit Diagnosis: Other abnormalities of gait and mobility (R26.89);Pain Pain - Right/Left: Left Pain - part of body: Knee     Time: [id number] PT Time Calculation (min) (ACUTE ONLY): 30 min  Charges:  $Gait Training: 8-22 mins $Therapeutic Activity: 8-22 mins                     Karma Ganja, PT, DPT   Acute Rehabilitation Department [phone]   Otho Bellows 10/03/2019, 1:27 PM

## 2019-10-03 NOTE — Discharge Instructions (Signed)
Information on my medicine - ELIQUIS (apixaban)  This medication education was reviewed with me or my healthcare representative as part of my discharge preparation.  You were taking this medication prior to this hospital admission.   Why was Eliquis prescribed for you? Eliquis was prescribed for you to reduce the risk of a blood clot forming that can cause a stroke if you have a medical condition called atrial fibrillation (a type of irregular heartbeat).  What do You need to know about Eliquis ? Take your Eliquis TWICE DAILY - one tablet in the morning and one tablet in the evening with or without food. If you have difficulty swallowing the tablet whole please discuss with your pharmacist how to take the medication safely.  Take Eliquis exactly as prescribed by your doctor and DO NOT stop taking Eliquis without talking to the doctor who prescribed the medication.  Stopping may increase your risk of developing a stroke.  Refill your prescription before you run out.  After discharge, you should have regular check-up appointments with your healthcare provider that is prescribing your Eliquis.  In the future your dose may need to be changed if your kidney function or weight changes by a significant amount or as you get older.  What do you do if you miss a dose? If you miss a dose, take it as soon as you remember on the same day and resume taking twice daily.  Do not take more than one dose of ELIQUIS at the same time to make up a missed dose.  Important Safety Information A possible side effect of Eliquis is bleeding. You should call your healthcare provider right away if you experience any of the following: Bleeding from an injury or your nose that does not stop. Unusual colored urine (red or dark brown) or unusual colored stools (red or black). Unusual bruising for unknown reasons. A serious fall or if you hit your head (even if there is no bleeding).  Some medicines may interact with  Eliquis and might increase your risk of bleeding or clotting while on Eliquis. To help avoid this, consult your healthcare provider or pharmacist prior to using any new prescription or non-prescription medications, including herbals, vitamins, non-steroidal anti-inflammatory drugs (NSAIDs) and supplements.  This website has more information on Eliquis (apixaban): http://www.eliquis.com/eliquis/home  

## 2019-10-03 NOTE — TOC Progression Note (Signed)
Transition of Care [facility]) - Progression Note    Patient Details  Name: Alyssa Odonnell MRN: [id number] Date of Birth: 07/21/1934  [facility] CM/SW [place], [place] Phone Number: 10/03/2019, 4:38 PM  Clinical Narrative:    CSW spoke with pt at bedside.  Provided support as pt daughter will be extubated this afternoon. Was able to facilitate pt facetime with her son in law and grandson.   We discussed SNF placement- pt still hesitant about placement. We went over offers, star ratings and CMS information. Pt has chosen [facility].  After pt facetime call she requested to take a nap, pt visibly and understandably sad given current events. Again provided support for pt and let her know I would f/u to see her [email or web address]   Preferred SNF information given to [facility] Medicare, await auth #.    Expected Discharge Plan: [place] Barriers to Discharge: Continued Medical Work up, Ship broker  Expected Discharge Plan and Services Expected Discharge Plan: [place] In-house Referral: Clinical Social Work Discharge Planning Services: CM Consult Post Acute Care Choice: [place] Living arrangements for the past 2 months: [place], Single Family Home  Readmission Risk Interventions Readmission Risk Prevention Plan 10/02/2019  Post Dischage Appt Not Complete  Appt Comments plan for SNF  Medication Screening Complete  Transportation Screening Complete  Some recent data might be hidden

## 2019-10-03 NOTE — TOC Progression Note (Signed)
Transition of Care [facility]) - Progression Note    Patient Details  Name: Alyssa Odonnell MRN: [id number] Date of Birth: [birth year]  Transition of Care [facility]) CM/SW [place], [place] Phone Number: 10/03/2019, 10:44 AM  Clinical Narrative:    CSW spoke with pt at bedside. Provided support and brief life review with pt. She and her family have chosen to extubate her daughter today. They are awaiting family to come to her daughters bedside at [facility]. 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 She will have the chance to facetime later today.  CSW provided CMS lists of SNF offers, will f/u with pt later to discuss further. Authorization was initiated yesterday, pt will need new COVID swab prior to dc to SNF.    Expected Discharge Plan: [place] Barriers to Discharge: Continued Medical Work up, Ship broker  Expected Discharge Plan and Services Expected Discharge Plan: [place] In-house Referral: Clinical Social Work Discharge Planning Services: CM Consult Post Acute Care Choice: [place] Living arrangements for the past 2 months: [place], Single Family Home  Readmission Risk Interventions Readmission Risk Prevention Plan 10/02/2019  Post Dischage Appt Not Complete  Appt Comments plan for SNF  Medication Screening Complete  Transportation Screening Complete  Some recent data might be hidden

## 2019-10-03 NOTE — Progress Notes (Signed)
PROGRESS NOTE    Alyssa Odonnell  [id number] DOB: [birth year] DOA: 09/30/2019 PCP: Drake Leach, MD   Brief Narrative:  HPI On 09/30/2019 by Dr. Fuller Plan Alyssa Odonnell is a 85 y.o. female with medical history significant of hypertension, hyperlipidemia, hiatal hernia with gastric outlet obstruction, PAF on Eliquis, and CVA.  Patient presents with reports of being unable to care for herself. She was recently hospitalized from 11/9-12/8 after having a large hiatal hernia with gastric outlet obstruction and volvulus.  Patient had a prolonged hospitalization requiring laparoscopic gastric plexi on 09/01/2019 with placement of gastrostomy tube and lysis of adhesions.  Subsequently, she developed acute ischemic left fourth and fifth toes requiring arteriogram and stent to the left SFA and balloon angioplasty of the trunk on 11/23 by vascular surgery.  Following the procedure patient had a groin hematoma requiring transfusion of 2 units of packed red blood cells.  She was initially sent to a skilled nursing facility, but reports that staff were rude and were not giving adequate care.  Therefore patient left after being there when they came home.  She lives at home alone and was utilizing a walker to ambulate.  After getting home she was told that people would come out to help and assist her, but no one ever came.  She complains of being constantly nauseous and has significant shortness of breath with exertion.  Leaning to either side also worsens shortness of breath symptoms.  She reports that she has been nauseous, and only able to eat soup.  Interim history Admitted for failure to thrive and generalized weakness.  Pending SNF placement.  Repeat Covid test ordered. Assessment & Plan   Failure to thrive/generalized weakness -Patient was discharged recently on 09/25/2019 to a nursing facility at which point patient decided to check herself out as she felt she was getting a poor care.  After  returning home she noted she was unable to care for herself and states she was not receiving help as she initially thought she would -PT and OT recommended SNF -TOC consulted  Dyspnea secondary to bilateral pleural effusions -CTA of the chest showed moderate bilateral pleural effusions left greater than the right -BNP 123.1 -Was given Lasix x1 dose, and breathing has improved -Echocardiogram 55 to 60%, mildly increased LV hypertrophy.  LV diastolic parameters are indeterminate.  Mild to moderate pericardial effusion no circumferential.  No evidence of tamponade physiology by echo -Will wean nasal cannula as possible   Malnutrition, moderate -Albumin 2.8 on admission -Does have a G-tube however was recently discharged today heart healthy diet and the G-tube has not been used -Prealbumin 10 -Nutrition and speech therapy consulted, and you nutritional supplements  Elevated D-dimer -D-dimer 6.44 -CTA chest negative for pulmonary embolism -Lower extremity Doppler showed no DVT.  Hematoma noted in the right colon and proximal thigh which is similar as compared to the study on 09/18/2019.  Large hiatal hernia -Status post laparoscopic gastric plexi with lysis of adhesions, placement of G-tube on 09/01/2019 (PEG tube will have to be in place for at least 6 weeks prior to being removed) -As above, patient was not receiving tube feeds -Speech therapy consulted.  Of note, patient was discharged on 09/25/2019 to SNF on a heart healthy diet -Seen by speech therapy today, recommended regular, thin liquid diet -Patient is tolerating her diet well  Paroxysmal atrial fibrillation -On chronic anticoagulation with Eliquis -Continue Eliquis and amiodarone  Peripheral vascular disease  -Patient was recently noted to have acute  ischemic left fourth and fifth toes -Recent admission, patient was seen by vascular surgery.  Status post stenting of the left SFA on 09/11/2019 -Continue  Plavix  Hypothyroidism -TSH 8.686 -Continue Synthroid -would recheck TSH in 2 weeks  GERD -Continue PPI  Chronic kidney disease, stage IIIb -Stable  Chronic normocytic anemia -hemoglobin appears stable  Goals of care -Patient appears to be depressed given that her daughter is currently dying at [facility].  She is having her transition to comfort care. -Discussed CODE STATUS with patient -Palliative care also consulted, patient has been transitioned to DNR.  DVT Prophylaxis Eliquis  Code Status: DNR  Family Communication: None at bedside  Disposition Plan: Admitted.  Pending SNF.  Repeat Covid ordered.  Consultants Palliative care  Procedures  Echocardiogram Lower extremity Doppler  Antibiotics   Anti-infectives (From admission, onward)   None      Subjective:   Alyssa Odonnell seen and examined today.  Patient states she is feeling better this morning.  She does feel bad that she is here now with her daughter lives at [place] long and actively dying.  She denies current chest pain, shortness of breath, abdominal pain, nausea or vomiting, diarrhea or constipation, dizziness or headache.  Feeling mildly better but continues to feel weak.  Feels that she is able to go to rehab to improve her strength, and hopeful that she will return home to take care of her flower beds and pets.  Objective:   Vitals:   10/02/19 1506 10/02/19 2123 10/03/19 0510 10/03/19 1319  BP: (!) 137/56 133/79 121/77   Pulse: 92 92 90   Resp: 20 18 17    Temp: 98.5 F (36.9 C) 98.2 F (36.8 C) 98 F (36.7 C)   TempSrc: Oral Oral Axillary   SpO2: 100% 98% 97% 95%  Weight:      Height:        Intake/Output Summary (Last 24 hours) at 10/03/2019 1407 Last data filed at 10/02/2019 2110 Gross per 24 hour  Intake --  Output 600 ml  Net -600 ml   Filed Weights   09/30/19 2200  Weight: 69.3 kg   Exam  General: Well developed, chronically ill appearing, NAD  HEENT: NCAT,  mucous membranes moist.   Cardiovascular: S1 S2 auscultated, RRR, SEM   Respiratory: Clear to auscultation bilaterally  Abdomen: Soft, nontender, nondistended, + bowel sounds, peg  Extremities: warm dry without cyanosis clubbing or edema  Neuro: AAOx3, nonfocal  Psych: Appropriate mood and affect  Data Reviewed: I have personally reviewed following labs and imaging studies  CBC: Recent Labs  Lab 09/30/19 1328 10/01/19 0316 10/02/19 0146  WBC 10.2 7.9 8.9  NEUTROABS 7.7  --   --   HGB 9.6* 8.7* 8.9*  HCT 31.4* 28.0* 28.7*  MCV 94.0 90.3 90.8  PLT 344 325 [id number]   Basic Metabolic Panel: Recent Labs  Lab 09/30/19 1328 10/01/19 0316 10/02/19 0146  NA 137 138 137  K 3.7 3.3* 3.8  CL 102 99 100  CO2 22 25 24   GLUCOSE 103* 92 88  BUN 22 19 18   CREATININE 1.39* 1.34* 1.25*  CALCIUM 8.9 8.7* 8.6*   GFR: Estimated Creatinine Clearance: 31.9 mL/min (A) (by C-G formula based on SCr of 1.25 mg/dL (H)). Liver Function Tests: Recent Labs  Lab 09/30/19 1328  AST 12*  ALT 14  ALKPHOS 73  BILITOT 0.9  PROT 6.4*  ALBUMIN 2.8*   No results for input(s): LIPASE, AMYLASE in the last 168 hours.  No results for input(s): AMMONIA in the last 168 hours. Coagulation Profile: No results for input(s): INR, PROTIME in the last 168 hours. Cardiac Enzymes: No results for input(s): CKTOTAL, CKMB, CKMBINDEX, TROPONINI in the last 168 hours. BNP (last 3 results) No results for input(s): PROBNP in the last 8760 hours. HbA1C: No results for input(s): HGBA1C in the last 72 hours. CBG: No results for input(s): GLUCAP in the last 168 hours. Lipid Profile: No results for input(s): CHOL, HDL, LDLCALC, TRIG, CHOLHDL, LDLDIRECT in the last 72 hours. Thyroid Function Tests: Recent Labs    10/01/19 0316  TSH 8.686*   Anemia Panel: No results for input(s): VITAMINB12, FOLATE, FERRITIN, TIBC, IRON, RETICCTPCT in the last 72 hours. Urine analysis:    Component Value Date/Time    COLORURINE STRAW (A) 09/30/2019 2057   APPEARANCEUR CLEAR 09/30/2019 2057   LABSPEC 1.010 09/30/2019 2057   PHURINE 6.0 09/30/2019 2057   GLUCOSEU NEGATIVE 09/30/2019 2057   HGBUR NEGATIVE 09/30/2019 2057   BILIRUBINUR NEGATIVE 09/30/2019 2057   KETONESUR NEGATIVE 09/30/2019 2057   PROTEINUR NEGATIVE 09/30/2019 2057   NITRITE NEGATIVE 09/30/2019 2057   LEUKOCYTESUR TRACE (A) 09/30/2019 2057   Sepsis Labs: @LABRCNTIP (procalcitonin:4,lacticidven:4)  ) Recent Results (from the past 240 hour(s))  SARS CORONAVIRUS 2 (TAT 6-24 HRS) Nasopharyngeal Nasopharyngeal Swab     Status: None   Collection Time: 09/25/19  5:20 PM   Specimen: Nasopharyngeal Swab  Result Value Ref Range Status   SARS Coronavirus 2 NEGATIVE NEGATIVE Final    Comment: (NOTE) SARS-CoV-2 target nucleic acids are NOT DETECTED. The SARS-CoV-2 RNA is generally detectable in upper and lower respiratory specimens during the acute phase of infection. Negative results do not preclude SARS-CoV-2 infection, do not rule out co-infections with other pathogens, and should not be used as the sole basis for treatment or other patient management decisions. Negative results must be combined with clinical observations, patient history, and epidemiological information. The expected result is Negative. Fact Sheet for Patients: SugarRoll.be Fact Sheet for Healthcare Providers: https://www.woods-mathews.com/ This test is not yet approved or cleared by the [place] FDA and  has been authorized for detection and/or diagnosis of SARS-CoV-2 by FDA under an Emergency Use Authorization (EUA). This EUA will remain  in effect (meaning this test can be used) for the duration of the COVID-19 declaration under Section 56 4(b)(1) of the Act, 21 U.S.C. section 360bbb-3(b)(1), unless the authorization is terminated or revoked sooner. Performed at [facility], [place] [address], [place],  [place] [address]   SARS CORONAVIRUS 2 (TAT 6-24 HRS) Nasopharyngeal Nasopharyngeal Swab     Status: None   Collection Time: 09/30/19  3:17 PM   Specimen: Nasopharyngeal Swab  Result Value Ref Range Status   SARS Coronavirus 2 NEGATIVE NEGATIVE Final    Comment: (NOTE) SARS-CoV-2 target nucleic acids are NOT DETECTED. The SARS-CoV-2 RNA is generally detectable in upper and lower respiratory specimens during the acute phase of infection. Negative results do not preclude SARS-CoV-2 infection, do not rule out co-infections with other pathogens, and should not be used as the sole basis for treatment or other patient management decisions. Negative results must be combined with clinical observations, patient history, and epidemiological information. The expected result is Negative. Fact Sheet for Patients: SugarRoll.be Fact Sheet for Healthcare Providers: https://www.woods-mathews.com/ This test is not yet approved or cleared by the [place] FDA and  has been authorized for detection and/or diagnosis of SARS-CoV-2 by FDA under an Emergency Use Authorization (EUA). This EUA  will remain  in effect (meaning this test can be used) for the duration of the COVID-19 declaration under Section 56 4(b)(1) of the Act, 21 U.S.C. section 360bbb-3(b)(1), unless the authorization is terminated or revoked sooner. Performed at [facility], [place] [address] [place], [place] [address]       Radiology Studies: VAS [place] LOWER EXTREMITY VENOUS (DVT)  Result Date: 10/03/2019  Lower Venous Study Indications: Elevated d-dimer [730066].  Anticoagulation: Eliquis. Performing Technologist: Darlina Sicilian RDCS  Examination Guidelines: A complete evaluation includes B-mode imaging, spectral Doppler, color Doppler, and power Doppler as needed of all accessible portions of each vessel. Bilateral testing is considered an integral part of a complete examination. Limited  examinations for reoccurring indications may be performed as noted.  +---------+---------------+---------+-----------+----------+-------------------+ RIGHT    CompressibilityPhasicitySpontaneityPropertiesThrombus Aging      +---------+---------------+---------+-----------+----------+-------------------+ CFV                     Yes      Yes                  Unable to compress                                                        due to pain in this                                                       area. Color doppler                                                       demonstrates patent                                                       flow                +---------+---------------+---------+-----------+----------+-------------------+ SFJ      Full                                                             +---------+---------------+---------+-----------+----------+-------------------+ FV Prox  Full                                                             +---------+---------------+---------+-----------+----------+-------------------+ FV Mid   Full                                                             +---------+---------------+---------+-----------+----------+-------------------+  FV DistalFull                                                             +---------+---------------+---------+-----------+----------+-------------------+ POP      Full           Yes      Yes                                      +---------+---------------+---------+-----------+----------+-------------------+ PTV      Full                                                             +---------+---------------+---------+-----------+----------+-------------------+ PERO     Full                                                             +---------+---------------+---------+-----------+----------+-------------------+    +---------+---------------+---------+-----------+----------+--------------+ LEFT     CompressibilityPhasicitySpontaneityPropertiesThrombus Aging +---------+---------------+---------+-----------+----------+--------------+ CFV      Full           Yes      Yes                                 +---------+---------------+---------+-----------+----------+--------------+ SFJ      Full                                                        +---------+---------------+---------+-----------+----------+--------------+ FV Prox  Full                                                        +---------+---------------+---------+-----------+----------+--------------+ FV Mid   Full                                                        +---------+---------------+---------+-----------+----------+--------------+ FV DistalFull                                                        +---------+---------------+---------+-----------+----------+--------------+ POP      Full           Yes      Yes                                 +---------+---------------+---------+-----------+----------+--------------+  PTV      Full                                                        +---------+---------------+---------+-----------+----------+--------------+ PERO     Full                                                        +---------+---------------+---------+-----------+----------+--------------+     Summary: Right: No evidence of deep vein thrombosis in the lower extremity. No indirect evidence of obstruction proximal to the inguinal ligament. No cystic structure found in the popliteal fossa. There is hematoma noted in the groin and proximal thigh same when compared to the study on 09/18/19. Had abdominal aortogram on 09/11/19 Left: No evidence of deep vein thrombosis in the lower extremity. No indirect evidence of obstruction proximal to the inguinal ligament. No cystic structure found in  the popliteal fossa.  *See table(s) above for measurements and observations. Electronically signed by Harold Barban MD on 10/03/2019 at 8:32:40 AM.    Final      Scheduled Meds: . amiodarone  200 mg Oral Daily  . apixaban  5 mg Oral BID  . clopidogrel  75 mg Oral Q breakfast  . feeding supplement  1 Container Oral TID BM  . latanoprost  1 drop Both Eyes QHS  . levothyroxine  100 mcg Oral QAC breakfast  . lidocaine  1 patch Transdermal Q24H  . multivitamin with minerals  1 tablet Oral Daily  . pantoprazole  40 mg Oral Daily  . senna-docusate  1 tablet Oral QHS  . sodium chloride flush  3 mL Intravenous Q12H   Continuous Infusions:    LOS: 3 days   Time Spent in minutes   45 minutes  Ritik Stavola D.O. on 10/03/2019 at 2:07 PM  Between 7am to 7pm - Please see pager noted on [email or web address]  After 7pm go to [email or web address]  And look for the night coverage person covering for me after hours  [facility] Office  [phone]

## 2019-10-04 LAB — BASIC METABOLIC PANEL
Anion gap: 10 (ref 5–15)
BUN: 12 mg/dL (ref 8–23)
CO2: 22 mmol/L (ref 22–32)
Calcium: 8.7 mg/dL — ABNORMAL LOW (ref 8.9–10.3)
Chloride: 107 mmol/L (ref 98–111)
Creatinine, Ser: 1.4 mg/dL — ABNORMAL HIGH (ref 0.44–1.00)
GFR calc Af Amer: 40 mL/min — ABNORMAL LOW (ref >60)
GFR calc non Af Amer: 35 mL/min — ABNORMAL LOW (ref >60)
Glucose, Bld: 120 mg/dL — ABNORMAL HIGH (ref 70–99)
Potassium: 3.7 mmol/L (ref 3.5–5.1)
Sodium: 139 mmol/L (ref 135–145)

## 2019-10-04 LAB — HEMOGLOBIN AND HEMATOCRIT, BLOOD
HCT: 28.9 % — ABNORMAL LOW (ref 36.0–46.0)
Hemoglobin: 8.9 g/dL — ABNORMAL LOW (ref 12.0–15.0)

## 2019-10-04 LAB — SARS CORONAVIRUS 2 (TAT 6-24 HRS): SARS Coronavirus 2: NEGATIVE

## 2019-10-04 NOTE — TOC Progression Note (Signed)
Transition of Care [facility]) - Progression Note    Patient Details  Name: Alyssa Odonnell MRN: [id number] Date of Birth: 20-Dec-1933  Transition of Care [facility]) CM/SW [place], [place] Phone Number: 10/04/2019, 12:38 PM  Clinical Narrative:    CSW spoke with pt at bedside, provided my sympathies over the passing of her daughter this morning. Pt grateful for care team support during her stay. She is emotionally exhausted. We discussed disposition- she is still okay with [facility].   A new COVID has been ordered, pt aware she will be swabbed. She is also aware of no visitors policy at [facility] and her isolation period. She hopes to get some rest today in anticipation of dc tomorrow.    Expected Discharge Plan: [place] Barriers to Discharge: Continued Medical Work up, Other (comment)(COVID swab)  Expected Discharge Plan and Services Expected Discharge Plan: [place] In-house Referral: Clinical Social Work Discharge Planning Services: CM Consult Post Acute Care Choice: [place] arrangements for the past 2 months: [place], [place]  Readmission Risk Interventions Readmission Risk Prevention Plan 10/02/2019  Post Dischage Appt Not Complete  Appt Comments plan for SNF  Medication Screening Complete  Transportation Screening Complete  Some recent data might be hidden

## 2019-10-04 NOTE — Progress Notes (Signed)
PROGRESS NOTE    Alyssa Odonnell  [id number] DOB: 08-07-1934 DOA: 09/30/2019 PCP: Drake Leach, MD  Brief Narrative:85 y.o.femalewith medical history significant ofhypertension, hyperlipidemia, hiatal hernia with gastric outlet obstruction, PAF on Eliquis, andCVA.Patientpresents with reports of being unable to care for herself. She was recently hospitalized from 11/9-12/8 after having a large hiatal hernia with gastric outlet obstruction and volvulus. Patient had a prolonged hospitalization requiring laparoscopic gastric plexi on 09/01/2019 with placement of gastrostomy tube and lysis of adhesions.Subsequently, she developed acute ischemic left fourth and fifth toesrequiring arteriogram and stent to the left SFA and balloon angioplasty of the trunk on 11/23 by vascular surgery. Following the procedure patient had a groin hematoma requiring transfusion of 2 units of packed red blood cells. She was initially sent to a skilled nursing facility, but reports that staff were rude and were not giving adequate care. Therefore patient left after being there when they came home. She lives at home alone and was utilizing a walker to ambulate. After getting home she was told that people would come out to help and assist her, but no one ever came. She complains of being constantly nauseous and has significant shortness of breath with exertion. Leaning to either side also worsens shortness of breath symptoms. She reports that she has been nauseous, and only able to eat soup.  Assessment & Plan:   Principal Problem:   Failure to thrive in adult Active Problems:   Generalized weakness   PAD (peripheral artery disease) (HCC)   PAF (paroxysmal atrial fibrillation) (HCC)   Pleural effusion, bilateral   Chronic anticoagulation   Pressure injury of skin   Malnutrition (HCC)   Hypothyroid   Malnutrition of moderate degree   Palliative care by specialist   DNR (do not resuscitate)   #1  dyspnea secondary to bilateral pleural effusion improved with Lasix 1 dose.  Echo shows ejection fraction 55 to 60% with mild to moderate pericardial effusion no evidence of tamponade.  #2 failure to thrive-PT recommends SNF Covid pending.  #3 moderate malnutrition prealbumin 10 albumin 2.8 appreciate dietary input.  #4 paroxysmal atrial fibrillation chronic on anticoagulation continue Eliquis and amiodarone for rate control.  #5 peripheral vascular disease status post stenting of left SFA on 09/11/2019 Dr. Carlis Abbott continue Plavix  #6 hypothyroidism continue Synthroid check TSH in 2 weeks  #7 large hiatal hernia status post laparoscopic surgery with placement of G-tube on 09/01/2019.  Patient requesting to see the surgeon.  However she has no surgical issues at this time.  #8 elevated D-dimer with no evidence of pulmonary embolism or DVT.  #9 CKD stage III stable  #10 goals of care patient is DO NOT RESUSCITATE.  She is very sad today as her daughter passed away today at [facility].  Pressure Injury 09/30/19 Sacrum Medial Stage II -  Partial thickness loss of dermis presenting as a shallow open ulcer with a red, pink wound bed without slough. (Active)  09/30/19 2150  Location: Sacrum  Location Orientation: Medial  Staging: Stage II -  Partial thickness loss of dermis presenting as a shallow open ulcer with a red, pink wound bed without slough.  Wound Description (Comments):   Present on Admission: Yes      Nutrition Problem: Moderate Malnutrition Etiology: chronic illness(hiatal hernia with GOO)     Signs/Symptoms: energy intake < or equal to 75% for > or equal to 1 month, mild fat depletion, moderate fat depletion, mild muscle depletion, moderate muscle depletion  Interventions: Boost Breeze, MVI, Magic cup  Estimated body mass index is 27.06 kg/m as calculated from the following:   Height as of this encounter: 5\' 3"  (1.6 m).   Weight as of this encounter:  69.3 kg.  DVT prophylaxis: Eliquis  code Status: DNR  family Communication: None at bedside patient has a brother who is living in [place]. Disposition Plan: Covid test pending SNF placement Consultants:   Palliative care  Procedures: Lower extremity Doppler and echo Antimicrobials: None Subjective: She is very tearful as her daughter just passed away she lives at home with her daughter prior to admission to hospital  Objective: Vitals:   10/03/19 1433 10/03/19 2038 10/04/19 0441 10/04/19 1450  BP: 138/79 (!) 144/83 (!) 157/82 (!) 150/90  Pulse: 95 93 94 (!) 106  Resp: 18 19 17 18   Temp: 98.2 F (36.8 C) 98.2 F (36.8 C) 98.3 F (36.8 C) 98.2 F (36.8 C)  TempSrc: Oral Oral Oral Oral  SpO2: 100% 99% 99% 100%  Weight:      Height:        Intake/Output Summary (Last 24 hours) at 10/04/2019 1519 Last data filed at 10/04/2019 1300 Gross per 24 hour  Intake 270 ml  Output 400 ml  Net -130 ml   Filed Weights   09/30/19 2200  Weight: 69.3 kg    Examination:  General exam: Appears calm and comfortable  Respiratory system: Clear to auscultation. Respiratory effort normal. Cardiovascular system: S1 & S2 heard, RRR. No JVD, murmurs, rubs, gallops or clicks. No pedal edema. Gastrointestinal system: Abdomen is nondistended, soft and nontender. No organomegaly or masses felt. Normal bowel sounds heard. Central nervous system: Alert and oriented. No focal neurological deficits. Extremities: Symmetric 5 x 5 power. Skin: No rashes, lesions or ulcers Psychiatry: Judgement and insight appear normal. Mood & affect appropriate.     Data Reviewed: I have personally reviewed following labs and imaging studies  CBC: Recent Labs  Lab 09/30/19 1328 10/01/19 0316 10/02/19 0146 10/04/19 0216  WBC 10.2 7.9 8.9  --   NEUTROABS 7.7  --   --   --   HGB 9.6* 8.7* 8.9* 8.9*  HCT 31.4* 28.0* 28.7* 28.9*  MCV 94.0 90.3 90.8  --   PLT 344 325 343  --    Basic Metabolic  Panel: Recent Labs  Lab 09/30/19 1328 10/01/19 0316 10/02/19 0146 10/04/19 0216  NA 137 138 137 139  K 3.7 3.3* 3.8 3.7  CL 102 99 100 107  CO2 22 25 24 22   GLUCOSE 103* 92 88 120*  BUN 22 19 18 12   CREATININE 1.39* 1.34* 1.25* 1.40*  CALCIUM 8.9 8.7* 8.6* 8.7*   GFR: Estimated Creatinine Clearance: 28.5 mL/min (A) (by C-G formula based on SCr of 1.4 mg/dL (H)). Liver Function Tests: Recent Labs  Lab 09/30/19 1328  AST 12*  ALT 14  ALKPHOS 73  BILITOT 0.9  PROT 6.4*  ALBUMIN 2.8*   No results for input(s): LIPASE, AMYLASE in the last 168 hours. No results for input(s): AMMONIA in the last 168 hours. Coagulation Profile: No results for input(s): INR, PROTIME in the last 168 hours. Cardiac Enzymes: No results for input(s): CKTOTAL, CKMB, CKMBINDEX, TROPONINI in the last 168 hours. BNP (last 3 results) No results for input(s): PROBNP in the last 8760 hours. HbA1C: No results for input(s): HGBA1C in the last 72 hours. CBG: No results for input(s): GLUCAP in the last 168 hours. Lipid Profile: No results for input(s): CHOL, HDL, LDLCALC,  TRIG, CHOLHDL, LDLDIRECT in the last 72 hours. Thyroid Function Tests: No results for input(s): TSH, T4TOTAL, FREET4, T3FREE, THYROIDAB in the last 72 hours. Anemia Panel: No results for input(s): VITAMINB12, FOLATE, FERRITIN, TIBC, IRON, RETICCTPCT in the last 72 hours. Sepsis Labs: Recent Labs  Lab 09/30/19 1328  LATICACIDVEN 1.2    Recent Results (from the past 240 hour(s))  SARS CORONAVIRUS 2 (TAT 6-24 HRS) Nasopharyngeal Nasopharyngeal Swab     Status: None   Collection Time: 09/25/19  5:20 PM   Specimen: Nasopharyngeal Swab  Result Value Ref Range Status   SARS Coronavirus 2 NEGATIVE NEGATIVE Final    Comment: (NOTE) SARS-CoV-2 target nucleic acids are NOT DETECTED. The SARS-CoV-2 RNA is generally detectable in upper and lower respiratory specimens during the acute phase of infection. Negative results do not preclude  SARS-CoV-2 infection, do not rule out co-infections with other pathogens, and should not be used as the sole basis for treatment or other patient management decisions. Negative results must be combined with clinical observations, patient history, and epidemiological information. The expected result is Negative. Fact Sheet for Patients: SugarRoll.be Fact Sheet for Healthcare Providers: https://www.woods-.com/ This test is not yet approved or cleared by the [place] FDA and  has been authorized for detection and/or diagnosis of SARS-CoV-2 by FDA under an Emergency Use Authorization (EUA). This EUA will remain  in effect (meaning this test can be used) for the duration of the COVID-19 declaration under Section 56 4(b)(1) of the Act, 21 U.S.C. section 360bbb-3(b)(1), unless the authorization is terminated or revoked sooner. Performed at [facility], [place] [address], [place], [place] [address]   SARS CORONAVIRUS 2 (TAT 6-24 HRS) Nasopharyngeal Nasopharyngeal Swab     Status: None   Collection Time: 09/30/19  3:17 PM   Specimen: Nasopharyngeal Swab  Result Value Ref Range Status   SARS Coronavirus 2 NEGATIVE NEGATIVE Final    Comment: (NOTE) SARS-CoV-2 target nucleic acids are NOT DETECTED. The SARS-CoV-2 RNA is generally detectable in upper and lower respiratory specimens during the acute phase of infection. Negative results do not preclude SARS-CoV-2 infection, do not rule out co-infections with other pathogens, and should not be used as the sole basis for treatment or other patient management decisions. Negative results must be combined with clinical observations, patient history, and epidemiological information. The expected result is Negative. Fact Sheet for Patients: SugarRoll.be Fact Sheet for Healthcare Providers: https://www.woods-.com/ This test is not yet approved or  cleared by the [place] FDA and  has been authorized for detection and/or diagnosis of SARS-CoV-2 by FDA under an Emergency Use Authorization (EUA). This EUA will remain  in effect (meaning this test can be used) for the duration of the COVID-19 declaration under Section 56 4(b)(1) of the Act, 21 U.S.C. section 360bbb-3(b)(1), unless the authorization is terminated or revoked sooner. Performed at [facility], [place] [address] [place], [place] [address]          Radiology Studies: No results found.      Scheduled Meds: . amiodarone  200 mg Oral Daily  . apixaban  5 mg Oral BID  . clopidogrel  75 mg Oral Q breakfast  . feeding supplement  1 Container Oral TID BM  . latanoprost  1 drop Both Eyes QHS  . levothyroxine  100 mcg Oral QAC breakfast  . lidocaine  1 patch Transdermal Q24H  . multivitamin with minerals  1 tablet Oral Daily  . pantoprazole  40 mg Oral Daily  .  senna-docusate  1 tablet Oral QHS  . sodium chloride flush  3 mL Intravenous Q12H   Continuous Infusions:   LOS: 4 days     Georgette Shell, MD [facility]  If 7PM-7AM, please contact night-coverage [email or web address] Password [facility] 10/04/2019, 3:19 PM

## 2019-10-04 NOTE — Progress Notes (Addendum)
Nutrition Follow-up  DOCUMENTATION CODES:   Non-severe (moderate) malnutrition in context of chronic illness  INTERVENTION:   -Continue Boost Breeze po TID, each supplement provides 250 kcal and 9 grams of protein -Continue MVI with minerals daily -Continue Magic cup TID with meals, each supplement provides 290 kcal and 9 grams of protein  NUTRITION DIAGNOSIS:   Moderate Malnutrition related to chronic illness(hiatal hernia with GOO) as evidenced by energy intake < or equal to 75% for > or equal to 1 month, mild fat depletion, moderate fat depletion, mild muscle depletion, moderate muscle depletion.  Ongoing  GOAL:   Patient will meet greater than or equal to 90% of their needs  Progressing   MONITOR:   PO intake, Supplement acceptance, Labs, Weight trends, Skin, I & O's  REASON FOR ASSESSMENT:   Consult Assessment of nutrition requirement/status  ASSESSMENT:   Alyssa Odonnell is a 85 y.o. female with medical history significant of hypertension, hyperlipidemia, hiatal hernia with gastric outlet obstruction, PAF on Eliquis, and CVA.  Patient presents with reports of being unable to care for herself. She was recently hospitalized from 11/9-12/8 after having a large hiatal hernia with gastric outlet obstruction and volvulus.  Patient had a prolonged hospitalization requiring laparoscopic gastric plexi on 09/01/2019 with placement of gastrostomy tube and lysis of adhesions.  Subsequently, she developed acute ischemic left fourth and fifth toes requiring arteriogram and stent to the left SFA and balloon angioplasty of the trunk on 11/23 by vascular surgery.  Following the procedure patient had a groin hematoma requiring transfusion of 2 units of packed red blood cells.  She was initially sent to a skilled nursing facility, but reports that staff were rude and were not giving adequate care.  Therefore patient left after being there when they came home.  She lives at home alone and was  utilizing a walker to ambulate.  After getting home she was told that people would come out to help and assist her, but no one ever came.  She complains of being constantly nauseous and has significant shortness of breath with exertion.  Leaning to either side also worsens shortness of breath symptoms.  She reports that she has been nauseous, and only able to eat soup.  Reviewed I/O's: -160 ml x 24 hours and -1.2 L since admission  UOP: 400 ml x 24 hours  Pt in with chaplain at time of visit. Pt is visibly and understandable upset, as her daughter passed away at [facility]. RD did not disturb.   Intake has improved since earlier this week; noted pt has been consuming 50% of meals per doc flowsheets. Pt also has been consuming Boost Breeze supplements per MAR (pt does not tolerate milky supplements, such as Boost Plus and Ensure well, as they cause diarrhea).   Venting g-tube remains clamped; this was placed during last hospital admission related to hiatal hernia with GOO and was not intended for feeding purposes.   Plan for possible SNF placement at discharge, however, pt hesitant to pursue due to prior bad experience.   Labs reviewed.   Diet Order:   Diet Order            Diet regular Room service appropriate? Yes with Assist; Fluid consistency: Thin  Diet effective now              EDUCATION NEEDS:   Education needs have been addressed  Skin:  Skin Assessment: Skin Integrity Issues: Skin Integrity Issues:: Stage II, Incisions Stage II:  sacrum Incisions: closed abdomen  Last BM:  10/03/19  Height:   Ht Readings from Last 1 Encounters:  09/30/19 5\' 3"  (1.6 m)    Weight:   Wt Readings from Last 1 Encounters:  09/30/19 69.3 kg    Ideal Body Weight:  52.3 kg  BMI:  Body mass index is 27.06 kg/m.  Estimated Nutritional Needs:   Kcal:  1550-1750  Protein:  75-90 grams  Fluid:  > 1.5 L    Maryrose Colvin A. Jimmye Norman, RD, LDN, [place] Registered Dietitian  II Certified Diabetes Care and Education Specialist Pager: [phone] After hours Pager: [phone]

## 2019-10-04 NOTE — Progress Notes (Signed)
  Palliative Medicine Inpatient Consult Follow Up Note  This NP reviewed patients chart. Met with Alyssa Odonnell this morning. She stated that she is having an incredibly difficult day due to her daughters death early this morning.  We discussed her comfort and the patients love for her. The patient stated that she cannot bear the thought of discharging today in her current health state. Her brother, Bud was present at bedside. Provided therapeutic listening for emotional support. The chaplain had seen her as well as social work this morning to offer additional support.  Ws asked by patient if we could have a notary present tomorrow for patient to change her will. I informed her that unfortunately this is not something we would be able to do in house.   Discussed overall plan of care and treatment options, ensuring decisions are within the context of the patients values and GOCs.  We will continue to follow along to offer assistance as able  Plan: Advanced Care Planning: - DNAR/DNI - MOST completed and placed in chart  Dispo: - Patient will discharge to [facility]. - Patients hope is transition home thereafter.  Secure Chatted Dr. Rodena Piety to offer support as needed  Total time spent coordinating care 30 minutes Greater than 50% of the time was spent in counseling and coordination of care  Tacey Ruiz, NP  Palliative Medicine Team Team Phone # [phone]  Wadie Lessen NP agrees with above assessment and plan

## 2019-10-04 NOTE — Progress Notes (Signed)
[facility]) Palliative  Referral received for palliative care assistance at [facility] once pt d/c tomorrow.   [facility] will follow up with [facility] and arrange for palliative services to begin.  Thank you, Venia Carbon RN, BSN, [facility] Liaison (in [place]) [phone]

## 2019-10-04 NOTE — Plan of Care (Signed)

## 2019-10-04 NOTE — Progress Notes (Signed)
I responded to spiritual care consult. Pt was awake and in tears. Alyssa Odonnell is in mourning with the recent loss of her Daughter who passed this morning at [facility]. She shared that she was thankful she was able to face-time her earlier in the week. Also, she said that her son and grandson were able to be present with her daughter as she passed.  Alyssa Odonnell has concerns about going to rehab. She said that although she is looking forward to going, she is fearful to go because of the experience she had when she was at a Nursing home in [place]. She did say that she needs this because she does want to get better and that he daughter would have wanted her to get better to take care of her home and her pets. Her pets are being taken care of by her son and a neighbor. I was able to offer Alyssa Odonnell space to process her grief. She did ask if she was able to have family member visit her because she said she feels lonely. I told that she could ask her Nurse if it was possible to have a visitor due to policy changes.  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 She said that she feels like the [place] hears her prayers, but is struggling with her faith at the moment.  I offered her pastoral support with words of comfort, prayer, and ministry of presence. Alyssa Odonnell offered me and the staff words of gratitude for the care she has received.   Palliative care Chaplain Resident Fidel Levy  [phone]

## 2019-10-05 ENCOUNTER — Inpatient Hospital Stay (HOSPITAL_COMMUNITY): Payer: Medicare Other

## 2019-10-05 MED ORDER — ONDANSETRON HCL 4 MG PO TABS: 4.0000 mg | ORAL_TABLET | Freq: Four times a day (QID) | ORAL | 0 refills | Status: DC | PRN

## 2019-10-05 MED ORDER — ADULT MULTIVITAMIN W/MINERALS CH: 1.0000 | ORAL_TABLET | Freq: Every day | ORAL | Status: DC

## 2019-10-05 MED ORDER — IOHEXOL 300 MG/ML  SOLN
50.0000 mL | Freq: Once | INTRAMUSCULAR | Status: AC | PRN
  Administered 2019-10-05: 50 mL

## 2019-10-05 NOTE — Discharge Summary (Signed)
Physician Discharge Summary  Alyssa Odonnell [id number] DOB: 10/13/1934 DOA: 09/30/2019  PCP: Drake Leach, MD  Admit date: 09/30/2019 Discharge date: 10/05/2019  Admitted From: Home Disposition: Skilled nursing facility  Recommendations for Outpatient Follow-up:  1. Follow up with PCP in 1-2 weeks 2. Please obtain BMP/CBC in one week 3. Please follow up with general surgery  Home Health none Equipment/Devices: None Discharge Condition stable and improved CODE STATUS do not resuscitate Diet recommendation: Cardiac Brief/Interim Summary:85 y.o.femalewith medical history significant ofhypertension, hyperlipidemia, hiatal hernia with gastric outlet obstruction, PAF on Eliquis, andCVA.Patientpresents with reports of being unable to care for herself. She was recently hospitalized from 11/9-12/8 after having a large hiatal hernia with gastric outlet obstruction and volvulus. Patient had a prolonged hospitalization requiring laparoscopic gastric plexi on 09/01/2019 with placement of gastrostomy tube and lysis of adhesions.Subsequently, she developed acute ischemic left fourth and fifth toesrequiring arteriogram and stent to the left SFA and balloon angioplasty of the trunk on 11/23 by vascular surgery. Following the procedure patient had a groin hematoma requiring transfusion of 2 units of packed red blood cells. She was initially sent to a skilled nursing facility, but reports that staff were rude and were not giving adequate care. Therefore patient left after being there when they came home. She lives at home alone and was utilizing a walker to ambulate. After getting home she was told that people would come out to help and assist her, but no one ever came. She complains of being constantly nauseous and has significant shortness of breath with exertion. Leaning to either side also worsens shortness of breath symptoms. She reports that she has been nauseous, and only able to eat  soup.  Discharge Diagnoses:  Principal Problem:   Failure to thrive in adult Active Problems:   Generalized weakness   PAD (peripheral artery disease) (HCC)   PAF (paroxysmal atrial fibrillation) (HCC)   Pleural effusion, bilateral   Chronic anticoagulation   Pressure injury of skin   Malnutrition (HCC)   Hypothyroid   Malnutrition of moderate degree   Palliative care by specialist   DNR (do not resuscitate) Failure to thrive/generalized weakness -Patient was discharged recently on 09/25/2019 to a nursing facility at which point patient decided to check herself out as she felt she was getting a poor care.  After returning home she noted she was unable to care for herself and states she was not receiving help as she initially thought she would -PT and OT recommended SNF  Dyspnea secondary to bilateral pleural effusions -CTA of the chest showed moderate bilateral pleural effusions left greater than the right -BNP 123.1 -Was given Lasix x1 dose, and breathing has improved -Echocardiogram 55 to 60%, mildly increased LV hypertrophy.  LV diastolic parameters are indeterminate.  Mild to moderate pericardial effusion no circumferential.  No evidence of tamponade physiology by echo  Malnutrition, moderate -Albumin 2.8 on admission -Does have a G-tube however was recently discharged today heart healthy diet and the G-tube has not been used -Prealbumin 10 -Patient will benefit from protein shakes through PEG tube 2-3 times a day since her appetite and p.o. intake is poor.  Elevated D-dimer -D-dimer 6.44 -CTA chest negative for pulmonary embolism -Lower extremity Doppler showed no DVT.  Hematoma noted in the right colon and proximal thigh which is similar as compared to the study on 09/18/2019.  Large hiatal hernia -Status post laparoscopic gastric plexi with lysis of adhesions, placement of G-tube on 09/01/2019 (PEG tube will have to be in  place for at least 6 weeks prior to being  removed) Follow-up with general surgery.  Paroxysmal atrial fibrillation -On chronic anticoagulation with Eliquis -Continue Eliquis and amiodarone  Peripheral vascular disease  -Patient was recently noted to have acute ischemic left fourth and fifth toes -Recent admission, patient was seen by vascular surgery.  Status post stenting of the left SFA on 09/11/2019 -Continue Plavix  Hypothyroidism -TSH 8.686 -Continue Synthroid  GERD -Continue PPI  Chronic kidney disease, stage IIIb -Stable  Chronic normocytic anemia -hemoglobin appears stable  Goals of care-patient is DO NOT RESUSCITATE.  Her daughter passed away 10/18/19 at [facility].  Pressure Injury 09/30/19 Sacrum Medial Stage II -  Partial thickness loss of dermis presenting as a shallow open ulcer with a red, pink wound bed without slough. (Active)  09/30/19 2150  Location: Sacrum  Location Orientation: Medial  Staging: Stage II -  Partial thickness loss of dermis presenting as a shallow open ulcer with a red, pink wound bed without slough.  Wound Description (Comments):   Present on Admission: Yes      Nutrition Problem: Moderate Malnutrition Etiology: chronic illness(hiatal hernia with GOO)    Signs/Symptoms: energy intake < or equal to 75% for > or equal to 1 month, mild fat depletion, moderate fat depletion, mild muscle depletion, moderate muscle depletion     Interventions: Boost Breeze, MVI, Magic cup  Estimated body mass index is 27.06 kg/m as calculated from the following:   Height as of this encounter: 5\' 3"  (1.6 m).   Weight as of this encounter: 69.3 kg.  Discharge Instructions  Discharge Instructions    Call MD for:  difficulty breathing, headache or visual disturbances   Complete by: As directed    Call MD for:  persistant nausea and vomiting   Complete by: As directed    Call MD for:  temperature >100.4   Complete by: As directed    Diet - low sodium heart healthy    Complete by: As directed    Increase activity slowly   Complete by: As directed      Allergies as of 10/05/2019      Reactions   Aspirin Other (See Comments)   Red splotches   Atorvastatin Other (See Comments)   Muscle spams   Nabumetone Itching   Nsaids    Reaction (??)   Pravastatin Other (See Comments)   Stiff walking difficulty Stiff walking difficulty   Procaine Swelling   Rosuvastatin Nausea Only, Other (See Comments)   Reflux and Muscle stiffness   Simvastatin Nausea Only, Other (See Comments)   Reflux and Muscle stiffness   Statins Other (See Comments)   Evolocumab Rash   Mobic [meloxicam] Rash   Penicillins Rash   Has patient had a PCN reaction causing immediate rash, facial/tongue/throat swelling, SOB or lightheadedness with hypotension: Yes Has patient had a PCN reaction causing severe rash involving mucus membranes or skin necrosis: No Has patient had a PCN reaction that required hospitalization: No Has patient had a PCN reaction occurring within the last 10 years: No If all of the above answers are "NO", then may proceed with Cephalosporin use.      Medication List    STOP taking these medications   cyclobenzaprine 5 MG tablet Commonly known as: FLEXERIL   docusate sodium 100 MG capsule Commonly known as: COLACE   methocarbamol 500 MG tablet Commonly known as: ROBAXIN   traMADol 50 MG tablet Commonly known as: ULTRAM     TAKE  these medications   amiodarone 200 MG tablet Commonly known as: PACERONE Take 1 tablet (200 mg total) by mouth daily.   clopidogrel 75 MG tablet Commonly known as: PLAVIX Take 1 tablet (75 mg total) by mouth daily with breakfast.   Eliquis 5 MG Tabs tablet Generic drug: apixaban TAKE 1 TABLET BY MOUTH TWO  TIMES DAILY What changed: how much to take   latanoprost 0.005 % ophthalmic solution Commonly known as: XALATAN Place 1 drop into both eyes at bedtime.   levothyroxine 100 MCG tablet Commonly known as:  SYNTHROID Take 100 mcg by mouth daily before breakfast.   lidocaine 5 % Commonly known as: LIDODERM Place 1 patch onto the skin daily. Remove & Discard patch within 12 hours or as directed by MD   multivitamin with minerals Tabs tablet Take 1 tablet by mouth daily. Start taking on: October 06, 2019   omeprazole 20 MG capsule Commonly known as: PRILOSEC Take 20 mg by mouth daily before breakfast.   ondansetron 4 MG tablet Commonly known as: ZOFRAN Take 1 tablet (4 mg total) by mouth every 6 (six) hours as needed for nausea.   triamcinolone cream 0.1 % Commonly known as: KENALOG Apply 1 application topically daily as needed for itching.      Follow-up Information    Erroll Luna, MD. Go on 10/27/2019.   Specialty: General Surgery Why: Your appointment is 10/27/2019 at 9:10am Please arrive 30 minutes prior to your appoinment to check in and fill out paperwork. Bring photo ID and insurance information. Contact information: [address] [place] [address] [phone]        Drake Leach, MD Follow up.   Specialty: Internal Medicine Contact information: [address] [place] [address] [phone]        Evans Lance, MD .   Specialty: Cardiology Contact information: [phone] [place]  [address] [phone]          Allergies  Allergen Reactions  . Aspirin Other (See Comments)    Red splotches  . Atorvastatin Other (See Comments)    Muscle spams  . Nabumetone Itching  . Nsaids     Reaction (??)  . Pravastatin Other (See Comments)    Stiff walking difficulty Stiff walking difficulty  . Procaine Swelling  . Rosuvastatin Nausea Only and Other (See Comments)    Reflux and Muscle stiffness  . Simvastatin Nausea Only and Other (See Comments)    Reflux and Muscle stiffness  . Statins Other (See Comments)  . Evolocumab Rash  . Mobic [Meloxicam] Rash  . Penicillins Rash    Has patient had a PCN  reaction causing immediate rash, facial/tongue/throat swelling, SOB or lightheadedness with hypotension: Yes Has patient had a PCN reaction causing severe rash involving mucus membranes or skin necrosis: No Has patient had a PCN reaction that required hospitalization: No Has patient had a PCN reaction occurring within the last 10 years: No If all of the above answers are "NO", then may proceed with Cephalosporin use.      Consultations:  Palliative care  General surgery   Procedures/Studies: DG Chest 2 View  Result Date: 09/30/2019 CLINICAL DATA:  Shortness of breath EXAM: CHEST - 2 VIEW COMPARISON:  08/29/2019 FINDINGS: Heart size is mildly enlarged, stable. No pulmonary vascular congestion. Moderate bilateral pleural effusions with associated bibasilar opacities. No pneumothorax. Degenerative changes of the shoulders. IMPRESSION: Moderate bilateral pleural effusions with associated bibasilar opacities, likely atelectasis. Electronically Signed  By: Davina Poke M.D.   On: 09/30/2019 16:10   CT Angio Chest PE W/Cm &/Or Wo Cm  Result Date: 09/30/2019 CLINICAL DATA:  Positive D-dimer. EXAM: CT ANGIOGRAPHY CHEST WITH CONTRAST TECHNIQUE: Multidetector CT imaging of the chest was performed using the standard protocol during bolus administration of intravenous contrast. Multiplanar CT image reconstructions and MIPs were obtained to evaluate the vascular anatomy. CONTRAST:  32mL OMNIPAQUE IOHEXOL 300 MG/ML  SOLN COMPARISON:  August 03, 2016. FINDINGS: Cardiovascular: Satisfactory opacification of the pulmonary arteries to the segmental level. No evidence of pulmonary embolism. Normal heart size. Mild pericardial effusion is noted. Coronary artery calcifications noted. Atherosclerosis of thoracic aorta is noted without aneurysm formation. Mediastinum/Nodes: Thyroid gland is unremarkable. No adenopathy is noted. Large sliding-type hiatal hernia is noted. Lungs/Pleura: No pneumothorax is noted.  Moderate bilateral pleural effusions are noted, left greater than right, with associated atelectasis of both lower lobes. Upper Abdomen: No acute abnormality. Musculoskeletal: No chest wall abnormality. No acute or significant osseous findings. Review of the MIP images confirms the above findings. IMPRESSION: 1. No definite evidence of pulmonary embolus. 2. Coronary artery calcifications are noted suggesting coronary artery disease. 3. Large sliding-type hiatal hernia is noted. 4. Moderate bilateral pleural effusions are noted, left greater than right, with associated atelectasis of both lower lobes. 5. Aortic atherosclerosis. Aortic Atherosclerosis (ICD10-I70.0). Electronically Signed   By: Marijo Conception M.D.   On: 09/30/2019 16:06   PERIPHERAL VASCULAR CATHETERIZATION  Result Date: 09/11/2019 Patient name: PARMA PICAZO MRN: [id number] DOB: 10-09-1934 Sex: female 09/11/2019 Pre-operative Diagnosis: Post-operative diagnosis:  Same Surgeon:  Erlene Quan C. Donzetta Matters, MD Procedure Performed: 1.  Ultrasound-guided cannulation right common femoral artery 2.  Aortogram with bilateral lower extremity runoff 3.  Ultrasound-guided cannulation left posterior tibial artery 4.  Stent of left SFA with distal 5 x 25 cm Viabahn and proximal 5 x 10 cm Viabahn 5.  Plain balloon angioplasty of left tibioperoneal trunk with 3 mm balloon 6.  Minx device closure right common femoral artery Indications: 85 year old female with history of right SFA intervention.  She now has discolored left toes lateral fourth and fifth toes on the plantar aspect.  She also has significant rest pain.  She is indicated for angiogram possible intervention. Findings: The aorta and iliac segments were free of flow-limiting stenosis.  The right lower extremity SFA is occluded along with the stent.  Tibial runoff was insufficiently evaluated but she does appear to have flow into the foot.  On the left side the SFA is flush occluded.  She reconstitutes above-knee  popliteal there is a stenosis at the level of the patella.  She has brisk runoff via the anterior tibial artery.  TP trunk is occluded has runoff via the posterior tibial and peroneal.  After intervention the SFA where previously was occluded the stent is now patent without any stenosis.  The TP trunk was previously occluded now is patent without flow-limiting stenosis or dissection.  There is palpable dorsalis pedis pulse at completion.  Procedure:  The patient was identified in the holding area and taken to room 8.  The patient was then placed supine on the table and prepped and draped in the usual sterile fashion.  A time out was called.  Ultrasound was used to evaluate the right comfortable arteries was noted be patent.  The areas anesthetized 1% lidocaine cannulated micropuncture needle followed by wire and sheath.  Images saved the permanent record.  Bentson wire was placed followed by 5 [place]  sheath.  Omni catheter placed to level L1 aortogram performed followed by bilateral lower extremity runoff with the above findings.  We then crossed the bifurcation with Omni catheter Bentson wire performed angled view which demonstrated flush occlusion of the SFA.  We used ultrasound to identify the posterior tibial artery.  This was cannulated micropuncture needle followed by wire and sheath.  We then went up and over the bifurcation with a long 6 French sheath to the common femoral artery.  Patient was fully heparinized at this time.  We used V 82 CXI catheter to cross retrograde through all the stenoses and occlusions.  We confirmed intraluminal access in the common femoral artery.  This was snared brought through and through access.  We then ballooned the SFA with 4 mm balloon.  A 5 mm Viabahn was placed distally followed by a second stent proximally to the level of the SFA takeoff.  These were postdilated with 5 mm balloon.  Completion demonstrated brisk flow through to the TP trunk.  This was then ballooned with  3 mm balloon at nominal pressure.  Completion demonstrated flow through the TP trunk down into the peroneal.  There was no flow in the PT given occlusive sheath distally.  The AT was large now much more brisk flow down to the foot.  With this we removed our wire.  We exchanged for short 6 French sheath in the right groin.  Minx device was deployed.  Sheath was removed from the left PT and pressure held.  There was a palpable anterior tibial and dorsalis pedis pulse to completion.  She tolerated procedure without any complication. Contrast: 180cc Brandon C. Donzetta Matters, MD [facility] Office: [phone] Pager: [phone]  DG ABDOMEN PEG TUBE LOCATION  Result Date: 10/05/2019 CLINICAL DATA:  Gastrostomy tube check. EXAM: ABDOMEN - 1 VIEW COMPARISON:  Chest x-ray 09/30/2019. FINDINGS: Gastrostomy tube noted with tip in good anatomic position. Contrast noted in the distal stomach, duodenum, and proximal small bowel. Calcified pelvic density noted consistent fibroid. Calcified pelvic density noted consistent with phleboliths. Right hip replacement. Bibasilar atelectasis and bilateral pleural effusions. Cardiac monitor device noted. IMPRESSION: Gastrostomy tube noted with tip in good anatomic position of the stomach. Contrast in the distal stomach, duodenum, and proximal small bowel. Electronically Signed   By: Marcello Moores  Register   On: 10/05/2019 10:13   VAS [place] GROIN PSEUDOANEURYSM  Result Date: 09/18/2019  ARTERIAL PSEUDOANEURYSM  Exam: Right groin Indications: Patient complains of groin pain. Comparison Study: No prior study on file Performing Technologist: Sharion Dove RVS  Examination Guidelines: A complete evaluation includes B-mode imaging, spectral Doppler, color Doppler, and power Doppler as needed of all accessible portions of each vessel. Bilateral testing is considered an integral part of a complete examination. Limited examinations for reoccurring indications may be  performed as noted.  Summary: No pseudoaneurysm noted in the right groin. There is hematoma noted in the groin and in the proximal thigh. Diagnosing physician: Ruta Hinds MD Electronically signed by Ruta Hinds MD on 09/18/2019 at 3:44:35 PM.   --------------------------------------------------------------------------------    Final    VAS [place] ABI WITH/WO TBI  Result Date: 09/07/2019 LOWER EXTREMITY DOPPLER STUDY Indications: Discoloration and pain. High Risk Factors: Hypertension, hyperlipidemia, prior CVA.  Comparison Study: 11/04/17 previous Performing Technologist: Abram Sander RVS  Examination Guidelines: A complete evaluation includes at minimum, Doppler waveform signals and systolic blood pressure reading at the level of bilateral brachial, anterior tibial, and posterior tibial arteries, when vessel segments  are accessible. Bilateral testing is considered an integral part of a complete examination. Photoelectric Plethysmograph (PPG) waveforms and toe systolic pressure readings are included as required and additional duplex testing as needed. Limited examinations for reoccurring indications may be performed as noted.  ABI Findings: +--------+------------------+-----+---------+--------+ Right   Rt Pressure (mmHg)IndexWaveform Comment  +--------+------------------+-----+---------+--------+ [id number]                    triphasic         +--------+------------------+-----+---------+--------+ PTA     78                0.57 biphasic          +--------+------------------+-----+---------+--------+ DP      82                0.60 biphasic          +--------+------------------+-----+---------+--------+ +--------+------------------+-----+--------+--------------------+ Left    Lt Pressure (mmHg)IndexWaveformComment              +--------+------------------+-----+--------+--------------------+ Brachial                       biphasicrestricted extremity  +--------+------------------+-----+--------+--------------------+ PTA     70                0.51 biphasic                     +--------+------------------+-----+--------+--------------------+ DP      61                0.45 biphasic                     +--------+------------------+-----+--------+--------------------+ +-------+-----------+-----------+------------+------------+ ABI/TBIToday's ABIToday's TBIPrevious ABIPrevious TBI +-------+-----------+-----------+------------+------------+ Right  0.60                                           +-------+-----------+-----------+------------+------------+ Left   0.51                                           +-------+-----------+-----------+------------+------------+  Summary: Right: Resting right ankle-brachial index indicates moderate right lower extremity arterial disease. Left: Resting left ankle-brachial index indicates moderate left lower extremity arterial disease.  *See table(s) above for measurements and observations.  Electronically signed by Monica Martinez MD on 09/07/2019 at 7:15:20 PM.   Final    ECHOCARDIOGRAM COMPLETE  Result Date: 10/01/2019   ECHOCARDIOGRAM REPORT   Patient Name:   HORTENSE WINEGAR Date of Exam: 10/01/2019 Medical Rec #:  [id number]      Height:       63.0 in Accession #:    [id number]     Weight:       152.8 lb Date of Birth:  06/17/1934      BSA:          1.72 m Patient Age:    52 years       BP:           132/64 mmHg Patient Gender: F              HR:           84 bpm. Exam Location:  Inpatient Procedure: 2D Echo Indications:    Congestive Heart Failure 428.0/I50.9  History:  Patient has prior history of Echocardiogram examinations, most                 recent 03/11/2017. Arrythmias:Atrial Fibrillation; Risk                 Factors:Hypertension and Dyslipidemia. Hx CVA.  Sonographer:    Clayton Lefort RDCS (AE) Referring Phys: [id number] RONDELL A SMITH IMPRESSIONS  1. Left ventricular ejection fraction,  by visual estimation, is 55 to 60%. The left ventricle has normal function. There is mildly increased left ventricular hypertrophy.  2. Left ventricular diastolic parameters are indeterminate.  3. The left ventricle has no regional wall motion abnormalities.  4. Global right ventricle has normal systolic function.The right ventricular size is normal. No increase in right ventricular wall thickness.  5. Left atrial size was normal.  6. Right atrial size was normal.  7. Moderate pericardial effusion.  8. The pericardial effusion is circumferential.  9. Mild to moderate pericardial effusion that is circumferential. Mild around the LV and RV, moderate adjacent to the RA. There is borderline respiratory variation approximately 20% across the MV, RA indentation without clear collapse. No RV collapse. Normal IVC. Collectively there is not evidence of tamponade physiology by echo. 10. The mitral valve is normal in structure. No evidence of mitral valve regurgitation. No evidence of mitral stenosis. 11. The tricuspid valve is normal in structure. Tricuspid valve regurgitation is trivial. 12. The aortic valve is tricuspid. Aortic valve regurgitation is not visualized. Mild aortic valve stenosis. 13. The pulmonic valve was not well visualized. Pulmonic valve regurgitation is not visualized. 14. Normal pulmonary artery systolic pressure. FINDINGS  Left Ventricle: Left ventricular ejection fraction, by visual estimation, is 55 to 60%. The left ventricle has normal function. The left ventricle has no regional wall motion abnormalities. There is mildly increased left ventricular hypertrophy. Left ventricular diastolic parameters are indeterminate. Right Ventricle: The right ventricular size is normal. No increase in right ventricular wall thickness. Global RV systolic function is has normal systolic function. The tricuspid regurgitant velocity is 2.38 m/s, and with an assumed right atrial pressure  of 3 mmHg, the estimated right  ventricular systolic pressure is normal at 25.8 mmHg. Left Atrium: Left atrial size was normal in size. Right Atrium: Right atrial size was normal in size Pericardium: A moderately sized pericardial effusion is present. The pericardial effusion is circumferential. Mild to moderate pericardial effusion that is circumferential. Mild around the LV and RV, moderate adjacent to the RA. There is borderline respiratory variation approximately 20% across the MV, RA indentation without clear collapse. No RV collapse. Normal IVC. Collectively there is not evidence of tamponade physiology by echo. Mitral Valve: The mitral valve is normal in structure. No evidence of mitral valve regurgitation. No evidence of mitral valve stenosis by observation. MV peak gradient, 3.1 mmHg. Tricuspid Valve: The tricuspid valve is normal in structure. Tricuspid valve regurgitation is trivial. Aortic Valve: The aortic valve is tricuspid. Aortic valve regurgitation is not visualized. Mild aortic stenosis is present. Moderate aortic valve annular calcification. There is moderate calcification of the aortic valve. Aortic valve mean gradient measures 10.2 mmHg. Aortic valve peak gradient measures 19.6 mmHg. Aortic valve area, by VTI measures 1.73 cm. Pulmonic Valve: The pulmonic valve was not well visualized. Pulmonic valve regurgitation is not visualized. Pulmonic regurgitation is not visualized. No evidence of pulmonic stenosis. Aorta: The aortic root is normal in size and structure. Pulmonary Artery: Indeterminant PASP, inadequate TR jet. IAS/Shunts: No atrial level shunt detected by color  flow Doppler.  LEFT VENTRICLE PLAX 2D LVIDd:         3.60 cm  Diastology LVIDs:         2.30 cm  LV e' lateral:   8.70 cm/s LV PW:         1.20 cm  LV E/e' lateral: 7.8 LV IVS:        1.00 cm  LV e' medial:    7.62 cm/s LVOT diam:     2.10 cm  LV E/e' medial:  8.9 LV SV:         36 ml LV SV Index:   20.48 LVOT Area:     3.46 cm  RIGHT VENTRICLE             IVC  RV Basal diam:  1.90 cm     IVC diam: 1.40 cm RV S prime:     12.10 cm/s LEFT ATRIUM             Index       RIGHT ATRIUM          Index LA diam:        3.30 cm 1.91 cm/m  RA Area:     9.40 cm LA Vol (A2C):   59.6 ml 34.56 ml/m RA Volume:   14.90 ml 8.64 ml/m LA Vol (A4C):   32.7 ml 18.96 ml/m LA Biplane Vol: 45.8 ml 26.56 ml/m  AORTIC VALVE AV Area (Vmax):    1.64 cm AV Area (Vmean):   1.57 cm AV Area (VTI):     1.73 cm AV Vmax:           221.20 cm/s AV Vmean:          148.000 cm/s AV VTI:            0.361 m AV Peak Grad:      19.6 mmHg AV Mean Grad:      10.2 mmHg LVOT Vmax:         104.97 cm/s LVOT Vmean:        67.100 cm/s LVOT VTI:          0.180 m LVOT/AV VTI ratio: 0.50  AORTA Ao Root diam: 2.70 cm MITRAL VALVE                        TRICUSPID VALVE MV Area (PHT): 3.42 cm             TR Peak grad:   22.8 mmHg MV Peak grad:  3.1 mmHg             TR Vmax:        242.00 cm/s MV Mean grad:  1.0 mmHg MV Vmax:       0.89 m/s             SHUNTS MV Vmean:      51.6 cm/s            Systemic VTI:  0.18 m MV VTI:        0.20 m               Systemic Diam: 2.10 cm MV PHT:        64.24 msec MV Decel Time: 222 msec MV E velocity: 68.15 cm/s 103 cm/s MV A velocity: 57.85 cm/s 70.3 cm/s MV E/A ratio:  1.18       1.5  Carlyle Dolly MD Electronically signed by Carlyle Dolly MD Signature Date/Time: 10/01/2019/12:20:09 PM  Final    VAS [place] LOWER EXTREMITY VENOUS (DVT)  Result Date: 10/03/2019  Lower Venous Study Indications: Elevated d-dimer [730066].  Anticoagulation: Eliquis. Performing Technologist: Darlina Sicilian RDCS  Examination Guidelines: A complete evaluation includes B-mode imaging, spectral Doppler, color Doppler, and power Doppler as needed of all accessible portions of each vessel. Bilateral testing is considered an integral part of a complete examination. Limited examinations for reoccurring indications may be performed as noted.   +---------+---------------+---------+-----------+----------+-------------------+ RIGHT    CompressibilityPhasicitySpontaneityPropertiesThrombus Aging      +---------+---------------+---------+-----------+----------+-------------------+ CFV                     Yes      Yes                  Unable to compress                                                        due to pain in this                                                       area. Color doppler                                                       demonstrates patent                                                       flow                +---------+---------------+---------+-----------+----------+-------------------+ SFJ      Full                                                             +---------+---------------+---------+-----------+----------+-------------------+ FV Prox  Full                                                             +---------+---------------+---------+-----------+----------+-------------------+ FV Mid   Full                                                             +---------+---------------+---------+-----------+----------+-------------------+ FV DistalFull                                                             +---------+---------------+---------+-----------+----------+-------------------+  POP      Full           Yes      Yes                                      +---------+---------------+---------+-----------+----------+-------------------+ PTV      Full                                                             +---------+---------------+---------+-----------+----------+-------------------+ PERO     Full                                                             +---------+---------------+---------+-----------+----------+-------------------+   +---------+---------------+---------+-----------+----------+--------------+ LEFT      CompressibilityPhasicitySpontaneityPropertiesThrombus Aging +---------+---------------+---------+-----------+----------+--------------+ CFV      Full           Yes      Yes                                 +---------+---------------+---------+-----------+----------+--------------+ SFJ      Full                                                        +---------+---------------+---------+-----------+----------+--------------+ FV Prox  Full                                                        +---------+---------------+---------+-----------+----------+--------------+ FV Mid   Full                                                        +---------+---------------+---------+-----------+----------+--------------+ FV DistalFull                                                        +---------+---------------+---------+-----------+----------+--------------+ POP      Full           Yes      Yes                                 +---------+---------------+---------+-----------+----------+--------------+ PTV      Full                                                        +---------+---------------+---------+-----------+----------+--------------+  PERO     Full                                                        +---------+---------------+---------+-----------+----------+--------------+     Summary: Right: No evidence of deep vein thrombosis in the lower extremity. No indirect evidence of obstruction proximal to the inguinal ligament. No cystic structure found in the popliteal fossa. There is hematoma noted in the groin and proximal thigh same when compared to the study on 09/18/19. Had abdominal aortogram on 09/11/19 Left: No evidence of deep vein thrombosis in the lower extremity. No indirect evidence of obstruction proximal to the inguinal ligament. No cystic structure found in the popliteal fossa.  *See table(s) above for measurements and observations.  Electronically signed by Harold Barban MD on 10/03/2019 at 8:32:40 AM.    Final     (Echo, Carotid, EGD, Colonoscopy, ERCP)    Subjective: Upset above everything Wants surgery to see her  Discharge Exam: Vitals:   10/04/19 2221 10/05/19 0459  BP: (!) 143/86 (!) 149/83  Pulse: 99 (!) 103  Resp: 18 17  Temp: 98.3 F (36.8 C) 98.3 F (36.8 C)  SpO2: 100% 99%   Vitals:   10/04/19 0441 10/04/19 1450 10/04/19 2221 10/05/19 0459  BP: (!) 157/82 (!) 150/90 (!) 143/86 (!) 149/83  Pulse: 94 (!) 106 99 (!) 103  Resp: 17 18 18 17   Temp: 98.3 F (36.8 C) 98.2 F (36.8 C) 98.3 F (36.8 C) 98.3 F (36.8 C)  TempSrc: Oral Oral Oral Oral  SpO2: 99% 100% 100% 99%  Weight:      Height:        General: Pt is alert, awake, not in acute distress Cardiovascular: RRR, S1/S2 +, no rubs, no gallops Respiratory: CTA bilaterally, no wheezing, no rhonchi Abdominal: Soft, NT, ND, bowel sounds +.peg in place Extremities: no edema, no cyanosis    The results of significant diagnostics from this hospitalization (including imaging, microbiology, ancillary and laboratory) are listed below for reference.     Microbiology: Recent Results (from the past 240 hour(s))  SARS CORONAVIRUS 2 (TAT 6-24 HRS) Nasopharyngeal Nasopharyngeal Swab     Status: None   Collection Time: 09/25/19  5:20 PM   Specimen: Nasopharyngeal Swab  Result Value Ref Range Status   SARS Coronavirus 2 NEGATIVE NEGATIVE Final    Comment: (NOTE) SARS-CoV-2 target nucleic acids are NOT DETECTED. The SARS-CoV-2 RNA is generally detectable in upper and lower respiratory specimens during the acute phase of infection. Negative results do not preclude SARS-CoV-2 infection, do not rule out co-infections with other pathogens, and should not be used as the sole basis for treatment or other patient management decisions. Negative results must be combined with clinical observations, patient history, and epidemiological information. The  expected result is Negative. Fact Sheet for Patients: SugarRoll.be Fact Sheet for Healthcare Providers: https://www.woods-.com/ This test is not yet approved or cleared by the [place] FDA and  has been authorized for detection and/or diagnosis of SARS-CoV-2 by FDA under an Emergency Use Authorization (EUA). This EUA will remain  in effect (meaning this test can be used) for the duration of the COVID-19 declaration under Section 56 4(b)(1) of the Act, 21 U.S.C. section 360bbb-3(b)(1), unless the authorization is terminated or revoked sooner. Performed at [facility], [place]  [address], [place], [place] [address]   SARS CORONAVIRUS 2 (TAT 6-24 HRS) Nasopharyngeal Nasopharyngeal Swab     Status: None   Collection Time: 09/30/19  3:17 PM   Specimen: Nasopharyngeal Swab  Result Value Ref Range Status   SARS Coronavirus 2 NEGATIVE NEGATIVE Final    Comment: (NOTE) SARS-CoV-2 target nucleic acids are NOT DETECTED. The SARS-CoV-2 RNA is generally detectable in upper and lower respiratory specimens during the acute phase of infection. Negative results do not preclude SARS-CoV-2 infection, do not rule out co-infections with other pathogens, and should not be used as the sole basis for treatment or other patient management decisions. Negative results must be combined with clinical observations, patient history, and epidemiological information. The expected result is Negative. Fact Sheet for Patients: SugarRoll.be Fact Sheet for Healthcare Providers: https://www.woods-.com/ This test is not yet approved or cleared by the [place] FDA and  has been authorized for detection and/or diagnosis of SARS-CoV-2 by FDA under an Emergency Use Authorization (EUA). This EUA will remain  in effect (meaning this test can be used) for the duration of the COVID-19 declaration under Section 56  4(b)(1) of the Act, 21 U.S.C. section 360bbb-3(b)(1), unless the authorization is terminated or revoked sooner. Performed at [facility], [place] [address], [place], [place] [address]   SARS CORONAVIRUS 2 (TAT 6-24 HRS) Nasopharyngeal Nasopharyngeal Swab     Status: None   Collection Time: 10/04/19  2:03 PM   Specimen: Nasopharyngeal Swab  Result Value Ref Range Status   SARS Coronavirus 2 NEGATIVE NEGATIVE Final    Comment: (NOTE) SARS-CoV-2 target nucleic acids are NOT DETECTED. The SARS-CoV-2 RNA is generally detectable in upper and lower respiratory specimens during the acute phase of infection. Negative results do not preclude SARS-CoV-2 infection, do not rule out co-infections with other pathogens, and should not be used as the sole basis for treatment or other patient management decisions. Negative results must be combined with clinical observations, patient history, and epidemiological information. The expected result is Negative. Fact Sheet for Patients: SugarRoll.be Fact Sheet for Healthcare Providers: https://www.woods-.com/ This test is not yet approved or cleared by the [place] FDA and  has been authorized for detection and/or diagnosis of SARS-CoV-2 by FDA under an Emergency Use Authorization (EUA). This EUA will remain  in effect (meaning this test can be used) for the duration of the COVID-19 declaration under Section 56 4(b)(1) of the Act, 21 U.S.C. section 360bbb-3(b)(1), unless the authorization is terminated or revoked sooner. Performed at [facility], [place] [address], [place], [place] [address]      Labs: BNP (last 3 results) Recent Labs    09/30/19 1328  BNP [id number]*   Basic Metabolic Panel: Recent Labs  Lab 09/30/19 1328 10/01/19 0316 10/02/19 0146 10/04/19 0216  NA 137 138 137 139  K 3.7 3.3* 3.8 3.7  CL 102 99 100 107  CO2 22 25 24 22   GLUCOSE 103* 92 88 120*  BUN 22 19 18 12    CREATININE 1.39* 1.34* 1.25* 1.40*  CALCIUM 8.9 8.7* 8.6* 8.7*   Liver Function Tests: Recent Labs  Lab 09/30/19 1328  AST 12*  ALT 14  ALKPHOS 73  BILITOT 0.9  PROT 6.4*  ALBUMIN 2.8*   No results for input(s): LIPASE, AMYLASE in the last 168 hours. No results for input(s): AMMONIA in the last 168 hours. CBC: Recent Labs  Lab 09/30/19 1328 10/01/19 0316 10/02/19 0146 10/04/19 0216  WBC 10.2 7.9 8.9  --  NEUTROABS 7.7  --   --   --   HGB 9.6* 8.7* 8.9* 8.9*  HCT 31.4* 28.0* 28.7* 28.9*  MCV 94.0 90.3 90.8  --   PLT 344 325 343  --    Cardiac Enzymes: No results for input(s): CKTOTAL, CKMB, CKMBINDEX, TROPONINI in the last 168 hours. BNP: Invalid input(s): POCBNP CBG: No results for input(s): GLUCAP in the last 168 hours. D-Dimer No results for input(s): DDIMER in the last 72 hours. Hgb A1c No results for input(s): HGBA1C in the last 72 hours. Lipid Profile No results for input(s): CHOL, HDL, LDLCALC, TRIG, CHOLHDL, LDLDIRECT in the last 72 hours. Thyroid function studies No results for input(s): TSH, T4TOTAL, T3FREE, THYROIDAB in the last 72 hours.  Invalid input(s): FREET3 Anemia work up No results for input(s): VITAMINB12, FOLATE, FERRITIN, TIBC, IRON, RETICCTPCT in the last 72 hours. Urinalysis    Component Value Date/Time   COLORURINE STRAW (A) 09/30/2019 2057   APPEARANCEUR CLEAR 09/30/2019 2057   LABSPEC 1.010 09/30/2019 2057   PHURINE 6.0 09/30/2019 2057   GLUCOSEU NEGATIVE 09/30/2019 2057   HGBUR NEGATIVE 09/30/2019 2057   BILIRUBINUR NEGATIVE 09/30/2019 2057   KETONESUR NEGATIVE 09/30/2019 2057   PROTEINUR NEGATIVE 09/30/2019 2057   NITRITE NEGATIVE 09/30/2019 2057   LEUKOCYTESUR TRACE (A) 09/30/2019 2057   Sepsis Labs Invalid input(s): PROCALCITONIN,  WBC,  LACTICIDVEN Microbiology Recent Results (from the past 240 hour(s))  SARS CORONAVIRUS 2 (TAT 6-24 HRS) Nasopharyngeal Nasopharyngeal Swab     Status: None   Collection Time: 09/25/19   5:20 PM   Specimen: Nasopharyngeal Swab  Result Value Ref Range Status   SARS Coronavirus 2 NEGATIVE NEGATIVE Final    Comment: (NOTE) SARS-CoV-2 target nucleic acids are NOT DETECTED. The SARS-CoV-2 RNA is generally detectable in upper and lower respiratory specimens during the acute phase of infection. Negative results do not preclude SARS-CoV-2 infection, do not rule out co-infections with other pathogens, and should not be used as the sole basis for treatment or other patient management decisions. Negative results must be combined with clinical observations, patient history, and epidemiological information. The expected result is Negative. Fact Sheet for Patients: SugarRoll.be Fact Sheet for Healthcare Providers: https://www.woods-.com/ This test is not yet approved or cleared by the [place] FDA and  has been authorized for detection and/or diagnosis of SARS-CoV-2 by FDA under an Emergency Use Authorization (EUA). This EUA will remain  in effect (meaning this test can be used) for the duration of the COVID-19 declaration under Section 56 4(b)(1) of the Act, 21 U.S.C. section 360bbb-3(b)(1), unless the authorization is terminated or revoked sooner. Performed at [facility], [place] [address], [place], [place] [address]   SARS CORONAVIRUS 2 (TAT 6-24 HRS) Nasopharyngeal Nasopharyngeal Swab     Status: None   Collection Time: 09/30/19  3:17 PM   Specimen: Nasopharyngeal Swab  Result Value Ref Range Status   SARS Coronavirus 2 NEGATIVE NEGATIVE Final    Comment: (NOTE) SARS-CoV-2 target nucleic acids are NOT DETECTED. The SARS-CoV-2 RNA is generally detectable in upper and lower respiratory specimens during the acute phase of infection. Negative results do not preclude SARS-CoV-2 infection, do not rule out co-infections with other pathogens, and should not be used as the sole basis for treatment or other patient  management decisions. Negative results must be combined with clinical observations, patient history, and epidemiological information. The expected result is Negative. Fact Sheet for Patients: SugarRoll.be Fact Sheet for Healthcare Providers: https://www.woods-.com/ This test is not  yet approved or cleared by the [place] and  has been authorized for detection and/or diagnosis of SARS-CoV-2 by FDA under an Emergency Use Authorization (EUA). This EUA will remain  in effect (meaning this test can be used) for the duration of the COVID-19 declaration under Section 56 4(b)(1) of the Act, 21 U.S.C. section 360bbb-3(b)(1), unless the authorization is terminated or revoked sooner. Performed at [facility], [place] [address] [place], [place] [address]   SARS CORONAVIRUS 2 (TAT 6-24 HRS) Nasopharyngeal Nasopharyngeal Swab     Status: None   Collection Time: 10/04/19  2:03 PM   Specimen: Nasopharyngeal Swab  Result Value Ref Range Status   SARS Coronavirus 2 NEGATIVE NEGATIVE Final    Comment: (NOTE) SARS-CoV-2 target nucleic acids are NOT DETECTED. The SARS-CoV-2 RNA is generally detectable in upper and lower respiratory specimens during the acute phase of infection. Negative results do not preclude SARS-CoV-2 infection, do not rule out co-infections with other pathogens, and should not be used as the sole basis for treatment or other patient management decisions. Negative results must be combined with clinical observations, patient history, and epidemiological information. The expected result is Negative. Fact Sheet for Patients: SugarRoll.be Fact Sheet for Healthcare Providers: https://www.woods-Jaala Bohle.com/ This test is not yet approved or cleared by the [place] FDA and  has been authorized for detection and/or diagnosis of SARS-CoV-2 by FDA under an Emergency Use Authorization  (EUA). This EUA will remain  in effect (meaning this test can be used) for the duration of the COVID-19 declaration under Section 56 4(b)(1) of the Act, 21 U.S.C. section 360bbb-3(b)(1), unless the authorization is terminated or revoked sooner. Performed at [facility], [place] [address], [place], [place] [address]      Time coordinating discharge: 44 minutes  SIGNED:   Georgette Shell, MD  [facility] 10/05/2019, 11:03 AM Pager   If 7PM-7AM, please contact night-coverage [email or web address] Password [email or web address]

## 2019-10-05 NOTE — Social Work (Signed)
Clinical Social Worker facilitated patient discharge including contacting patient family and facility to confirm patient discharge plans.  Clinical information faxed to facility and family agreeable with plan.  CSW arranged ambulance transport via [facility] to [facility] at 1:30pm. RN to call [phone]  with report prior to discharge.  Clinical Social Worker will sign off for now as social work intervention is no longer needed. Please consult [place] again if new need arises.  Westley Hummer, MSW, [place] Social Worker [phone]

## 2019-10-05 NOTE — Progress Notes (Signed)
[facility] Progress Note     Subjective: CC-  Worried about going to SNF, states that she still feels very weak and doesn't think she is ready. States that she has no appetite. Denies abdominal pain, nausea, or vomiting with PO intake but she just has no appetite. She states that she gets full quickly even with small portions. G tube has not been used or flushed in >1 week.  Objective: Vital signs in last 24 hours: Temp:  [98.2 F (36.8 C)-98.3 F (36.8 C)] 98.3 F (36.8 C) (12/17 0459) Pulse Rate:  [99-106] 103 (12/17 0459) Resp:  [17-18] 17 (12/17 0459) BP: (143-150)/(83-90) 149/83 (12/17 0459) SpO2:  [99 %-100 %] 99 % (12/17 0459) Last BM Date: 10/03/19  Intake/Output from previous day: 12/16 0701 - 12/17 0700 In: 270 [P.O.:270] Out: 800 [Urine:800] Intake/Output this shift: No intake/output data recorded.  PE: Gen:  Alert, NAD Pulm:  Rate and effort normal Abd: Soft, NT/ND, +BS, G tube site erythematous and irritated with some likely gastric contents noted leaking around tube, easy to inject water into tube but does not pull back, single suture remains in place Skin: warm and dry  Lab Results:  Recent Labs    10/04/19 0216  HGB 8.9*  HCT 28.9*   BMET Recent Labs    10/04/19 0216  NA 139  K 3.7  CL 107  CO2 22  GLUCOSE 120*  BUN 12  CREATININE 1.40*  CALCIUM 8.7*   PT/INR No results for input(s): LABPROT, INR in the last 72 hours. CMP     Component Value Date/Time   NA 139 10/04/2019 0216   NA 141 03/03/2018 1352   K 3.7 10/04/2019 0216   CL 107 10/04/2019 0216   CO2 22 10/04/2019 0216   GLUCOSE 120 (H) 10/04/2019 0216   BUN 12 10/04/2019 0216   BUN 31 (H) 03/03/2018 1352   CREATININE 1.40 (H) 10/04/2019 0216   CALCIUM 8.7 (L) 10/04/2019 0216   PROT 6.4 (L) 09/30/2019 1328   ALBUMIN 2.8 (L) 09/30/2019 1328   AST 12 (L) 09/30/2019 1328   ALT 14 09/30/2019 1328   ALKPHOS 73 09/30/2019 1328   BILITOT 0.9 09/30/2019 1328   GFRNONAA 35 (L) 10/04/2019 0216   GFRAA 40 (L) 10/04/2019 0216   Lipase     Component Value Date/Time   LIPASE 30 08/29/2019 0004       Studies/Results: No results found.  Anti-infectives: Anti-infectives (From admission, onward)   None       Assessment/Plan Hx CVA Hypothyroidism HTN HLD Paroxysmal A Fib on Elquis  Afib with RVR Code status DNR  Hiatal Hernia with Gastric Outlet Obstruction S/p laparoscopic gastropexy with g-tube, LOA 11/13 Dr. Brantley Stage - 5 weeks postop - Will order study to check placement of G tube. Her prealbumin is low and she is not taking in much by mouth. Could consider starting some tube feedings or protein shakes per G tube.  ID - none FEN - regular diet VTE - eliquis, plavix Foley - none Follow up - Dr. Brantley Stage   LOS: 5 days    [place], [facility] 10/05/2019, 9:12 AM Please see Amion for pager number during day hours 7:00am-4:30pm

## 2019-10-05 NOTE — Progress Notes (Signed)
Report called to [employer] at [phone] given to [place], [place].  Patient is going to room 120-A

## 2019-10-05 NOTE — TOC Transition Note (Signed)
Transition of Care [facility]) - CM/SW Discharge Note   Patient Details  Name: Alyssa Odonnell MRN: [id number] Date of Birth: 12-31-1933  [facility] CM/SW Contact:  Alexander [place], [place] Phone Number: 10/05/2019, 12:41 PM   Clinical Narrative:    CSW spoke with pt at bedside. Her brother is aware of discharge today. Pt authorized for dc to [facility] SNF today, COVID neg.   AuthorizationJosem Kaufmann [phone] [id number]   Final next level of care: [name] Barriers to Discharge: Barriers Resolved   Patient Goals and CMS Choice Patient states their goals for this hospitalization and ongoing recovery are:: to get some help to take care of myself CMS Medicare.gov Compare Post Acute Care list provided to:: Patient Choice offered to / list presented to : Patient  Discharge Placement   Existing PASRR number confirmed : 10/03/19          Patient chooses bed at: [facility] Patient to be transferred to facility by: [place] Name of family member notified: pt declined for her brother to be called states family is aware she is leaving today Patient and family notified of of transfer: 10/05/19  Discharge Plan and Services In-house Referral: Clinical Social Work Discharge Planning Services: CM Consult Post Acute Care Choice: [place]                               Social Determinants of Health (SDOH) Interventions     Readmission Risk Interventions Readmission Risk Prevention Plan 10/02/2019  Post Dischage Appt Not Complete  Appt Comments plan for SNF  Medication Screening Complete  Transportation Screening Complete  Some recent data might be hidden

## 2019-10-12 ENCOUNTER — Other Ambulatory Visit: Payer: Self-pay

## 2019-10-12 ENCOUNTER — Emergency Department (HOSPITAL_COMMUNITY)
Admission: EM | Admit: 2019-10-12 | Discharge: 2019-10-15 | Disposition: A | Discharge status: Home / Self Care | Payer: Medicare Other | Attending: Emergency Medicine | Admitting: Emergency Medicine

## 2019-10-12 ENCOUNTER — Encounter (HOSPITAL_COMMUNITY): Payer: Self-pay

## 2019-10-12 DIAGNOSIS — E039 Hypothyroidism, unspecified: Secondary | ICD-10-CM | POA: Diagnosis not present

## 2019-10-12 DIAGNOSIS — R110 Nausea: Secondary | ICD-10-CM | POA: Insufficient documentation

## 2019-10-12 DIAGNOSIS — E860 Dehydration: Secondary | ICD-10-CM | POA: Diagnosis not present

## 2019-10-12 DIAGNOSIS — I480 Paroxysmal atrial fibrillation: Secondary | ICD-10-CM | POA: Insufficient documentation

## 2019-10-12 DIAGNOSIS — R2689 Other abnormalities of gait and mobility: Secondary | ICD-10-CM | POA: Insufficient documentation

## 2019-10-12 DIAGNOSIS — Z931 Gastrostomy status: Secondary | ICD-10-CM | POA: Diagnosis not present

## 2019-10-12 DIAGNOSIS — I10 Essential (primary) hypertension: Secondary | ICD-10-CM | POA: Diagnosis not present

## 2019-10-12 DIAGNOSIS — F4489 Other dissociative and conversion disorders: Secondary | ICD-10-CM | POA: Diagnosis not present

## 2019-10-12 DIAGNOSIS — R531 Weakness: Secondary | ICD-10-CM | POA: Diagnosis present

## 2019-10-12 DIAGNOSIS — Z20828 Contact with and (suspected) exposure to other viral communicable diseases: Secondary | ICD-10-CM | POA: Diagnosis not present

## 2019-10-12 DIAGNOSIS — Z8673 Personal history of transient ischemic attack (TIA), and cerebral infarction without residual deficits: Secondary | ICD-10-CM | POA: Diagnosis not present

## 2019-10-12 DIAGNOSIS — M6281 Muscle weakness (generalized): Secondary | ICD-10-CM | POA: Diagnosis not present

## 2019-10-12 LAB — COMPREHENSIVE METABOLIC PANEL
ALT: 19 U/L (ref 0–44)
AST: 16 U/L (ref 15–41)
Albumin: 3 g/dL — ABNORMAL LOW (ref 3.5–5.0)
Alkaline Phosphatase: 82 U/L (ref 38–126)
Anion gap: 10 (ref 5–15)
BUN: 18 mg/dL (ref 8–23)
CO2: 25 mmol/L (ref 22–32)
Calcium: 9.1 mg/dL (ref 8.9–10.3)
Chloride: 104 mmol/L (ref 98–111)
Creatinine, Ser: 1.24 mg/dL — ABNORMAL HIGH (ref 0.44–1.00)
GFR calc Af Amer: 47 mL/min — ABNORMAL LOW (ref >60)
GFR calc non Af Amer: 40 mL/min — ABNORMAL LOW (ref >60)
Glucose, Bld: 105 mg/dL — ABNORMAL HIGH (ref 70–99)
Potassium: 3.3 mmol/L — ABNORMAL LOW (ref 3.5–5.1)
Sodium: 139 mmol/L (ref 135–145)
Total Bilirubin: 0.3 mg/dL (ref 0.3–1.2)
Total Protein: 6 g/dL — ABNORMAL LOW (ref 6.5–8.1)

## 2019-10-12 LAB — CBC WITH DIFFERENTIAL/PLATELET
Abs Immature Granulocytes: 0.03 10*3/uL (ref 0.00–0.07)
Basophils Absolute: 0 10*3/uL (ref 0.0–0.1)
Basophils Relative: 0 %
Eosinophils Absolute: 0.1 10*3/uL (ref 0.0–0.5)
Eosinophils Relative: 1 %
HCT: 31.9 % — ABNORMAL LOW (ref 36.0–46.0)
Hemoglobin: 9.8 g/dL — ABNORMAL LOW (ref 12.0–15.0)
Immature Granulocytes: 0 %
Lymphocytes Relative: 28 %
Lymphs Abs: 2.3 10*3/uL (ref 0.7–4.0)
MCH: 27.8 pg (ref 26.0–34.0)
MCHC: 30.7 g/dL (ref 30.0–36.0)
MCV: 90.4 fL (ref 80.0–100.0)
Monocytes Absolute: 0.6 10*3/uL (ref 0.1–1.0)
Monocytes Relative: 8 %
Neutro Abs: 5.2 10*3/uL (ref 1.7–7.7)
Neutrophils Relative %: 63 %
Platelets: 275 10*3/uL (ref 150–400)
RBC: 3.53 MIL/uL — ABNORMAL LOW (ref 3.87–5.11)
RDW: 15 % (ref 11.5–15.5)
WBC: 8.2 10*3/uL (ref 4.0–10.5)
nRBC: 0 % (ref 0.0–0.2)

## 2019-10-12 LAB — URINALYSIS, ROUTINE W REFLEX MICROSCOPIC
Bilirubin Urine: NEGATIVE
Glucose, UA: NEGATIVE mg/dL
Hgb urine dipstick: NEGATIVE
Ketones, ur: NEGATIVE mg/dL
Nitrite: NEGATIVE
Protein, ur: NEGATIVE mg/dL
Specific Gravity, Urine: 1.019 (ref 1.005–1.030)
pH: 6 (ref 5.0–8.0)

## 2019-10-12 LAB — LIPASE, BLOOD: Lipase: 27 U/L (ref 11–51)

## 2019-10-12 MED ORDER — ENSURE ENLIVE PO LIQD
237.0000 mL | Freq: Once | ORAL | Status: AC
  Administered 2019-10-12: 237 mL
  Filled 2019-10-12: qty 237

## 2019-10-12 MED ORDER — PANTOPRAZOLE SODIUM 40 MG PO TBEC
40.0000 mg | DELAYED_RELEASE_TABLET | Freq: Every day | ORAL | Status: DC
  Administered 2019-10-12 – 2019-10-15 (×4): 40 mg via ORAL
  Filled 2019-10-12 (×4): qty 1

## 2019-10-12 MED ORDER — ONDANSETRON 4 MG PO TBDP
4.0000 mg | ORAL_TABLET | Freq: Once | ORAL | Status: AC
  Administered 2019-10-12: 4 mg via ORAL
  Filled 2019-10-12: qty 1

## 2019-10-12 MED ORDER — LEVOTHYROXINE SODIUM 100 MCG PO TABS
100.0000 ug | ORAL_TABLET | Freq: Every day | ORAL | Status: DC
  Administered 2019-10-13 – 2019-10-15 (×3): 100 ug via ORAL
  Filled 2019-10-12 (×3): qty 1

## 2019-10-12 MED ORDER — SODIUM CHLORIDE 0.9 % IV BOLUS
500.0000 mL | Freq: Once | INTRAVENOUS | Status: AC
  Administered 2019-10-12: 500 mL via INTRAVENOUS

## 2019-10-12 MED ORDER — ONDANSETRON 4 MG PO TBDP: 4.0000 mg | ORAL_TABLET | Freq: Three times a day (TID) | ORAL | 0 refills | Status: DC | PRN

## 2019-10-12 MED ORDER — AMIODARONE HCL 200 MG PO TABS
200.0000 mg | ORAL_TABLET | Freq: Every day | ORAL | Status: DC
  Administered 2019-10-13 – 2019-10-15 (×3): 200 mg via ORAL
  Filled 2019-10-12 (×3): qty 1

## 2019-10-12 MED ORDER — APIXABAN 5 MG PO TABS
5.0000 mg | ORAL_TABLET | Freq: Two times a day (BID) | ORAL | Status: DC
  Administered 2019-10-13 – 2019-10-15 (×6): 5 mg via ORAL
  Filled 2019-10-12 (×10): qty 1

## 2019-10-12 NOTE — ED Provider Notes (Addendum)
Emergency Department Provider Note   I have reviewed the triage vital signs and the nursing notes.   HISTORY  Chief Complaint Weakness   HPI Alyssa Odonnell is a 85 y.o. female returns to the emergency department from her rehab facility with report of confusion and refusing feeds in her G-tube.  Patient was admitted mid November with acute onset nausea.  She was found to have a gastric volvulus without obstruction in the setting of a large hiatal hernia.  Patient underwent gastropexy and G tube placement. She was discharged on 12/7 to SNF but left days later to return home. She came back to the ED but could not manage at home.  She was admitted and ultimately discharged to [facility].  Patient tells me that she is very unhappy there.  She feels that staff are ignoring her.  She states that she does feel nauseated when eating anything by mouth and that the boost to that they provide her there does make her especially nauseated and she did refuse once.  She tells me that her family brought some additional, different brand of nutritional supplements which she is willing to take. She does note some diarrhea this AM but that that has been an intermittent issue for her. No respiratory or other URI symptoms.   Past Medical History:  Diagnosis Date  . Gastric outlet obstruction 08/2019  . Glaucoma   . Hyperlipidemia   . Hypertension   . Hypothyroid   . PAF (paroxysmal atrial fibrillation) ([place])   . Stroke [facility])     Patient Active Problem List   Diagnosis Date Noted  . Palliative care by specialist   . DNR (do not resuscitate)   . Malnutrition of moderate degree 10/02/2019  . Pressure injury of skin 10/01/2019  . Malnutrition ([place]) 10/01/2019  . Hypothyroid 10/01/2019  . Failure to thrive in adult 09/30/2019  . Pleural effusion, bilateral 09/30/2019  . Chronic anticoagulation 09/30/2019  . 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 Gastric outlet obstruction 08/29/2019    . PAF (paroxysmal atrial fibrillation) ([place]) 02/02/2018  . 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[place]) 05/24/2017  . Cerebrovascular accident (CVA) due to embolism of left posterior cerebral artery ([place]) 03/09/2017  . htn 03/09/2017  . Generalized weakness 03/09/2017  . Stroke-like symptom 03/09/2017  . Right sided weakness   . PAD (peripheral artery disease) ([place])   . Essential hypertension   . Spinal stenosis of lumbar region   . Pure hypercholesterolemia   . Recurrent major depressive disorder [facility])     Past Surgical History:  Procedure Laterality Date  . ABDOMINAL AORTOGRAM W/LOWER EXTREMITY Right 09/11/2019   Procedure: ABDOMINAL AORTOGRAM W/LOWER EXTREMITY;  Surgeon: Waynetta Sandy, MD;  Location: [place] CV [facility];  Service: Cardiovascular;  Laterality: Right;  . ABDOMINAL HERNIA REPAIR    . GASTROSTOMY N/A 09/01/2019   Procedure: INSERTION OF GASTROSTOMY TUBE;  Surgeon: Erroll Luna, MD;  Location: [place];  Service: General;  Laterality: N/A;  . LAPAROSCOPIC LYSIS OF ADHESIONS  09/01/2019   Procedure: Laparoscopic Lysis Of Adhesions;  Surgeon: Erroll Luna, MD;  Location: [place];  Service: General;;  . LAPAROSCOPIC NISSEN FUNDOPLICATION N/A [id number]   Procedure: LAPAROSCOPIC, POSSIBLE OPEN, GASTROPEXY;  Surgeon: Erroll Luna, MD;  Location: [place];  Service: General;  Laterality: N/A;  . LOOP RECORDER INSERTION N/A 03/11/2017   Procedure: Loop Recorder Insertion;  Surgeon: Evans Lance, MD;  Location: [place] [facility];  Service: Cardiovascular;  Laterality: N/A;  .  PERIPHERAL VASCULAR BALLOON ANGIOPLASTY Left 09/11/2019   Procedure: PERIPHERAL VASCULAR BALLOON ANGIOPLASTY;  Surgeon: Waynetta Sandy, MD;  Location: [place] [facility];  Service: Cardiovascular;  Laterality: Left;  TP trunk  . PERIPHERAL VASCULAR CATHETERIZATION Right 07/06/2016   Procedure: Lower Extremity Angiography;  Surgeon: Angelia Mould, MD;  Location: [place] [facility];   Service: Cardiovascular;  Laterality: Right;  . PERIPHERAL VASCULAR CATHETERIZATION Right 07/06/2016   Procedure: Peripheral Vascular Intervention;  Surgeon: Angelia Mould, MD;  Location: [place] CV [facility];  Service: Cardiovascular;  Laterality: Right;  Superficial femoral  . PERIPHERAL VASCULAR INTERVENTION Left 09/11/2019   Procedure: PERIPHERAL VASCULAR INTERVENTION;  Surgeon: Waynetta Sandy, MD;  Location: [place] [facility];  Service: Cardiovascular;  Laterality: Left;  SFA  . TEE WITHOUT CARDIOVERSION N/A 03/11/2017   Procedure: TRANSESOPHAGEAL ECHOCARDIOGRAM (TEE);  Surgeon: Thayer Headings, MD;  Location: [facility] ENDOSCOPY;  Service: Cardiovascular;  Laterality: N/A;  . TOTAL HIP ARTHROPLASTY      Allergies Aspirin, Atorvastatin, Nabumetone, Nsaids, Pravastatin, Procaine, Rosuvastatin, Simvastatin, Statins, Evolocumab, Mobic [meloxicam], and Penicillins  Family History  Problem Relation Age of Onset  . Stroke Father     Social History Social History   Tobacco Use  . Smoking status: Never Smoker  . Smokeless tobacco: Never Used  Substance Use Topics  . Alcohol use: No  . Drug use: No    Review of Systems  Constitutional: No fever/chills Eyes: No visual changes. ENT: No sore throat. Cardiovascular: Denies chest pain. Respiratory: Denies shortness of breath. Gastrointestinal: No abdominal pain. Positive nausea, no vomiting. Positive diarrhea.  No constipation. Genitourinary: Negative for dysuria. Musculoskeletal: Negative for back pain. Skin: Negative for rash. Neurological: Negative for headaches, focal weakness or numbness.  10-point ROS otherwise negative.  ____________________________________________   PHYSICAL EXAM:  VITAL SIGNS: ED Triage Vitals  Enc Vitals Group     BP 10/12/19 1729 (!) 157/81     Pulse Rate 10/12/19 1729 96     Resp 10/12/19 1729 16     Temp 10/12/19 1729 98.9 F (37.2 C)     Temp Source 10/12/19 1729 Oral     SpO2  10/12/19 1729 96 %     Weight 10/12/19 1734 152 lb 12.5 oz (69.3 kg)     Height 10/12/19 1734 5\' 3"  (1.6 m)   Constitutional: Alert and oriented. Well appearing and in no acute distress. Eyes: Conjunctivae are normal.  Head: Atraumatic. Nose: No congestion/rhinnorhea. Neck: No stridor.   Cardiovascular: Normal rate, regular rhythm. Good peripheral circulation. Grossly normal heart sounds.   Respiratory: Normal respiratory effort.  No retractions. Lungs CTAB. Gastrointestinal: Soft and nontender. No distention.  Musculoskeletal: No gross deformities of extremities. Neurologic:  Normal speech and language.  Skin:  Skin is warm, dry and intact. No rash noted.   ____________________________________________   LABS (all labs ordered are listed, but only abnormal results are displayed)  Labs Reviewed  COMPREHENSIVE METABOLIC PANEL - Abnormal; Notable for the following components:      Result Value   Potassium 3.3 (*)    Glucose, Bld 105 (*)    Creatinine, Ser 1.24 (*)    Total Protein 6.0 (*)    Albumin 3.0 (*)    GFR calc non Af Amer 40 (*)    GFR calc Af Amer 47 (*)    All other components within normal limits  CBC WITH DIFFERENTIAL/PLATELET - Abnormal; Notable for the following components:   RBC 3.53 (*)    Hemoglobin  9.8 (*)    HCT 31.9 (*)    All other components within normal limits  URINALYSIS, ROUTINE W REFLEX MICROSCOPIC - Abnormal; Notable for the following components:   APPearance HAZY (*)    Leukocytes,Ua TRACE (*)    Bacteria, UA FEW (*)    All other components within normal limits  LIPASE, BLOOD   ____________________________________________  RADIOLOGY  None  ____________________________________________   PROCEDURES  Procedure(s) performed:   Procedures  None  ____________________________________________   INITIAL IMPRESSION / ASSESSMENT AND PLAN / ED COURSE  Pertinent labs & imaging results that were available during my care of the patient were  reviewed by me and considered in my medical decision making (see chart for details).   Patient presents to the emergency department for evaluation of not eating and report of confusion by staff.  Patient is alert and oriented x4 with me here in the emergency department and is will get a clear description of her presenting complaint as well as recent medical history.  Do not find her confused.  She does appear very unhappy with her current situation in the rehab facility.  We discussed that after her initial discharge she attempted to be at home but things did not go well and that at this time she does need to stay in the rehab facility.  Patient understands this.  I will try and treat her symptoms with ODT Zofran as opposed to the pill which she is prescribed.  We will give Ensure via G-tube and obtain screening labs.  Patient understands that she will likely need to return to the rehab facility for now.   Patient's labs reviewed. Patient taking nutrition by G-tube here and cooperative. No confusion. Will discharge back to rehab center. No respiratory or COVID like symptoms.   Called the patient's nursing home. Apparently she signed out AMA from there saying that she is refusing medical care. They are not willing to take her back at this point. I do not feel that the patient is safe for discharge to home. Will consult social work for assistance with either getting her back to her existing SNF or placement. I have ordered home meds.  ____________________________________________  FINAL CLINICAL IMPRESSION(S) / ED DIAGNOSES  Final diagnoses:  Nausea  Dehydration     MEDICATIONS GIVEN DURING THIS VISIT:  Medications  sodium chloride 0.9 % bolus 500 mL (0 mLs Intravenous Stopped 10/12/19 1922)  ondansetron (ZOFRAN-ODT) disintegrating tablet 4 mg (4 mg Oral Given 10/12/19 1814)  feeding supplement (ENSURE ENLIVE) (ENSURE ENLIVE) liquid 237 mL (237 mLs Per Tube Given 10/12/19 1902)     NEW  OUTPATIENT MEDICATIONS STARTED DURING THIS VISIT:  New Prescriptions   ONDANSETRON (ZOFRAN ODT) 4 MG DISINTEGRATING TABLET    Take 1 tablet (4 mg total) by mouth every 8 (eight) hours as needed for nausea or vomiting.    Note:  This document was prepared using Dragon voice recognition software and may include unintentional dictation errors.  Nanda Quinton, MD, [facility]    Jerrol Helmers, Wonda Olds, MD 10/12/19 2056    Margette Fast, MD 10/12/19 2121

## 2019-10-12 NOTE — Discharge Instructions (Signed)
You were seen in the emergency department today with nausea and mild dehydration.  He received IV fluids.  I am changing him your nausea medication to a dissolvable tablet.  Please continue your nutrition through your G-tube.

## 2019-10-12 NOTE — ED Notes (Signed)
PTAR called  

## 2019-10-12 NOTE — ED Triage Notes (Signed)
Pt arrived via [facility] from [facility], where she is at for rehab. Pt c/o weakness and states that is due to not having an appetite since her "stomach surgery" she had here last month. Per EMS, [facility] staff told them pt won't eat and she is confused and believes the Boost is supposed o go in her PEG tube and she not drink it. Staff told EMS they have tried giving her nutrients through PEG tube, but pt refuses. Pt is A&Ox4.

## 2019-10-12 NOTE — ED Notes (Signed)
PTAR Cancelled

## 2019-10-13 LAB — SARS CORONAVIRUS 2 (TAT 6-24 HRS): SARS Coronavirus 2: NEGATIVE

## 2019-10-13 NOTE — ED Notes (Signed)
Visually checked on Pt. She was resting well. Counted resp. At 20.

## 2019-10-13 NOTE — ED Notes (Signed)
Visually checked on Pt. She is resting well. Normal breathing pattern, symmetrical expansion of chest.

## 2019-10-13 NOTE — NC FL2 (Signed)
[place] LEVEL OF CARE SCREENING TOOL     IDENTIFICATION  Patient Name: Alyssa Odonnell Birthdate: 1934-02-13 Sex: female Admission Date (Current Location): 10/12/2019  [facility] and [place] Number:  Herbalist and Address:  [facility], [place] [address] [employer], [place] [address]      Provider Number: [id number]  Attending Physician Name and Address:  Default, Provider, MD  Relative Name and Phone Number:       Current Level of Care: Hospital Recommended Level of Care: [place] Prior Approval Number:    Date Approved/Denied:   PASRR Number: [id number]  Discharge Plan: SNF    Current Diagnoses: Patient Active Problem List   Diagnosis Date Noted  . Palliative care by specialist   . DNR (do not resuscitate)   . Malnutrition of moderate degree 10/02/2019  . Pressure injury of skin 10/01/2019  . Malnutrition ([place]) 10/01/2019  . Hypothyroid 10/01/2019  . Failure to thrive in adult 09/30/2019  . Pleural effusion, bilateral 09/30/2019  . Chronic anticoagulation 09/30/2019  . 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 Gastric outlet obstruction 08/29/2019  . PAF (paroxysmal atrial fibrillation) ([place]) 02/02/2018  . 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[place]) 05/24/2017  . Cerebrovascular accident (CVA) due to embolism of left posterior cerebral artery ([place]) 03/09/2017  . htn 03/09/2017  . Generalized weakness 03/09/2017  . Stroke-like symptom 03/09/2017  . Right sided weakness   . PAD (peripheral artery disease) ([place])   . Essential hypertension   . Spinal stenosis of lumbar region   . Pure hypercholesterolemia   . Recurrent major depressive disorder ([facility])     Orientation RESPIRATION BLADDER Height & Weight     Self, Time, Situation, Place  O2(2L HFNC) Continent, External catheter Weight: 152 lb 12.5 oz (69.3 kg) Height:  5\' 3"  (160 cm)  BEHAVIORAL SYMPTOMS/MOOD NEUROLOGICAL BOWEL NUTRITION STATUS      Continent Diet(Patient  has PEG)  AMBULATORY STATUS COMMUNICATION OF NEEDS Skin   Extensive Assist Verbally PU Stage and Appropriate Care(Foam dressing on sacrum)                       Personal Care Assistance Level of Assistance  Bathing, Feeding, Dressing Bathing Assistance: Maximum assistance Feeding assistance: Independent Dressing Assistance: Maximum assistance     Functional Limitations Info  Sight, Hearing, Speech Sight Info: Adequate Hearing Info: Adequate Speech Info: Adequate    SPECIAL CARE FACTORS FREQUENCY  PT (By licensed PT), OT (By licensed OT)     PT Frequency: 5x weekly OT Frequency: 5x weekly            Contractures Contractures Info: Not present    Additional Factors Info  Code Status, Allergies Code Status Info: Full Code Allergies Info: Aspirin, Atorvastatin, Nabumetone, Nsaids, Pravastatin, Procaine, Rosuvastatin, Simvastatin, Statins, Evolocumab, Mobic (Meloxicam), Penicillins           Current Medications (10/13/2019):  This is the current hospital active medication list Current Facility-Administered Medications  Medication Dose Route Frequency Provider Last Rate Last Admin  . amiodarone (PACERONE) tablet 200 mg  200 mg Oral Daily Long, Wonda Olds, MD      . apixaban (ELIQUIS) tablet 5 mg  5 mg Oral BID Long, Wonda Olds, MD   5 mg at 10/13/19 0554  . levothyroxine (SYNTHROID) tablet 100 mcg  100 mcg Oral QAC breakfast Long, Wonda Olds, MD   100 mcg at 10/13/19 0850  . pantoprazole (PROTONIX) EC tablet 40 mg  40 mg Oral Daily Long, Wonda Olds, MD   40 mg at 10/12/19 2249   Current Outpatient Medications  Medication Sig Dispense Refill  . amiodarone (PACERONE) 200 MG tablet Take 1 tablet (200 mg total) by mouth daily. 30 tablet 0  . clopidogrel (PLAVIX) 75 MG tablet Take 1 tablet (75 mg total) by mouth daily with breakfast. 30 tablet 0  . ELIQUIS 5 MG TABS tablet TAKE 1 TABLET BY MOUTH TWO  TIMES DAILY (Patient taking differently: Take 5 mg by mouth 2 (two) times  daily. ) 60 tablet 0  . latanoprost (XALATAN) 0.005 % ophthalmic solution Place 1 drop into both eyes at bedtime.    Marland Kitchen levothyroxine (SYNTHROID, LEVOTHROID) 100 MCG tablet Take 100 mcg by mouth daily before breakfast.     . lidocaine (LIDODERM) 5 % Place 1 patch onto the skin daily. Remove & Discard patch within 12 hours or as directed by MD 5 patch 0  . Multiple Vitamin (MULTIVITAMIN WITH MINERALS) TABS tablet Take 1 tablet by mouth daily.    Marland Kitchen omeprazole (PRILOSEC) 20 MG capsule Take 20 mg by mouth daily before breakfast.    . ondansetron (ZOFRAN ODT) 4 MG disintegrating tablet Take 1 tablet (4 mg total) by mouth every 8 (eight) hours as needed for nausea or vomiting. 20 tablet 0  . triamcinolone cream (KENALOG) 0.1 % Apply 1 application topically daily as needed for itching.       Discharge Medications: Please see discharge summary for a list of discharge medications.  Relevant Imaging Results:  Relevant Lab Results:   Additional Information SSN:[id number]  Archie Endo, LCSW

## 2019-10-13 NOTE — ED Notes (Signed)
Pt ambulated self efficiently to restroom with the use of a walker. Pt returned safely to bedside. 

## 2019-10-13 NOTE — Discharge Planning (Signed)
EDCM met with pt at bedside to discuss disposition plans and inform her that he request for SNF placement may take some time but we are working on her request.  Pt very appreciative of information.

## 2019-10-13 NOTE — ED Notes (Signed)
Lunch Tray Ordered @ 1033. 

## 2019-10-13 NOTE — ED Notes (Signed)
Visually checked on pt. She is resting well and does not appear to be in any distress. Her breathing pattern is normal and symmetrical.

## 2019-10-13 NOTE — ED Notes (Signed)
Pt. Resting comfortably. Pt's breathing pattern good with symmetrical rise and fall of chest.

## 2019-10-13 NOTE — ED Notes (Signed)
Pt eating ice cream, though didn't eat the rest of her meal.

## 2019-10-13 NOTE — ED Notes (Signed)
Messaged Pharmacy to send medication to this RN

## 2019-10-13 NOTE — Evaluation (Signed)
Physical Therapy Evaluation Patient Details Name: Alyssa Odonnell MRN: [id number] DOB: [birth year] Today's Date: 10/13/2019   History of Present Illness  Pt is an 85 y.o. female admitted from [facility] SNF on 10/12/19 with AMS and nausea; apparently pt signed out of SNF AMA. PMH includes HTN, PAF, glaucoma, stroke, gastric outlet obstruction.    Clinical Impression  Pt presents with an overall decrease in functional mobility secondary to above. PTA, pt receiving rehab at SNF since recent hospital admission; prior to initial admission, was living alone and mod indep with RW, pt retired Biomedical scientist. Today, pt requiring modA for transfers and standing mobility; limited by generalized weakness, fatigue and decreased activity tolerance; high risk for falls. Pt understands need for continued SNF-level therapies and is agreeable; adamant that she will not return to [facility]. Will follow acutely to address established goals.     Follow Up Recommendations SNF;Supervision for mobility/OOB    Equipment Recommendations  None recommended by PT    Recommendations for Other Services       Precautions / Restrictions Precautions Precautions: Fall Precaution Comments: peg Restrictions Weight Bearing Restrictions: No      Mobility  Bed Mobility Overal bed mobility: Needs Assistance Bed Mobility: Supine to Sit;Sit to Supine     Supine to sit: Mod assist;HOB elevated Sit to supine: Mod assist   General bed mobility comments: ModA for UE support to elevate trunk and scoot hips to EOB; modA return to supine  Transfers Overall transfer level: Needs assistance Equipment used: 2 person hand held assist Transfers: Sit to/from Stand Sit to Stand: Mod assist         General transfer comment: ModA for trunk elevation with bilateral HHA (no RW in ED), max cues for upright posture  Ambulation/Gait Ambulation/Gait assistance: Mod assist Gait Distance (Feet): 1 Feet Assistive  device: 2 person hand held assist   Gait velocity: Decreased   General Gait Details: Side steps towards HOB with bilat HHA and modA to maintain upright posture and balance, frequent cues to extend trunk  Stairs            Wheelchair Mobility    Modified Rankin (Stroke Patients Only)       Balance Overall balance assessment: Needs assistance Sitting-balance support: Feet supported;No upper extremity supported Sitting balance-Leahy Scale: Fair     Standing balance support: Bilateral upper extremity supported;During functional activity Standing balance-Leahy Scale: Poor                               Pertinent Vitals/Pain Pain Assessment: No/denies pain    Home Living Family/patient expects to be discharged to:: Skilled nursing facility                 Additional Comments: Daughter with health problems and not able to physically assist    Prior Function Level of Independence: Needs assistance   Gait / Transfers Assistance Needed: Prior to initial admission, ambulatory with RW and lived alone. Has been at [facility] for PT/OT           Hand Dominance        Extremity/Trunk Assessment   Upper Extremity Assessment Upper Extremity Assessment: Generalized weakness    Lower Extremity Assessment Lower Extremity Assessment: Generalized weakness       Communication      Cognition Arousal/Alertness: Awake/alert Behavior During Therapy: WFL for tasks assessed/performed Overall Cognitive Status: No family/caregiver present to determine baseline cognitive  functioning Area of Impairment: Memory;Awareness;Problem solving                     Memory: Decreased short-term memory     Awareness: Emergent Problem Solving: Requires verbal cues        General Comments      Exercises     Assessment/Plan    PT Assessment Patient needs continued PT services  PT Problem List Decreased strength;Decreased activity tolerance;Decreased  balance;Decreased mobility;Decreased knowledge of use of DME;Decreased knowledge of precautions;Decreased cognition       PT Treatment Interventions DME instruction;Gait training;Stair training;Functional mobility training;Therapeutic activities;Therapeutic exercise;Balance training;Patient/family education    PT Goals (Current goals can be found in the Care Plan section)  Acute Rehab PT Goals Patient Stated Goal: Understands need for continued rehab at SNF, but unwilling to return to [facility] PT Goal Formulation: With patient Time For Goal Achievement: 10/27/19 Potential to Achieve Goals: Good    Frequency Min 2X/week   Barriers to discharge        Co-evaluation               AM-PAC PT "6 Clicks" Mobility  Outcome Measure Help needed turning from your back to your side while in a flat bed without using bedrails?: A Lot Help needed moving from lying on your back to sitting on the side of a flat bed without using bedrails?: A Lot Help needed moving to and from a bed to a chair (including a wheelchair)?: A Lot Help needed standing up from a chair using your arms (e.g., wheelchair or bedside chair)?: A Lot Help needed to walk in hospital room?: A Lot Help needed climbing 3-5 steps with a railing? : Total 6 Click Score: 11    End of Session Equipment Utilized During Treatment: Gait belt Activity Tolerance: Patient limited by fatigue Patient left: in bed;with call bell/phone within reach Nurse Communication: Mobility status PT Visit Diagnosis: Other abnormalities of gait and mobility (R26.89);Muscle weakness (generalized) (M62.81)    Time: [id number] PT Time Calculation (min) (ACUTE ONLY): 18 min   Charges:   PT Evaluation $PT Eval Moderate Complexity: [place], PT, DPT Acute Rehabilitation Services  Pager [phone] Office [place] 10/13/2019, 10:44 AM

## 2019-10-13 NOTE — Discharge Planning (Signed)
TOC team notes consult for SNF placement.  EDCM placed order to PT evaluation and recommendation.

## 2019-10-13 NOTE — ED Notes (Signed)
Pt angry that she does not have a call bell or phone in her room.  Re-located to room with call bell and given portable phone to call her brother, which she did.  Pt insisted on speaking to charge RN who had already spoken with her, but she forgot.  Pt did not eat lunch d/t nausea.  PA cannot give zofran d/t prolonged qt's.

## 2019-10-13 NOTE — Progress Notes (Signed)
CSW faxed patient's clinical information to SNF's in [place]. CSW will present bed offers to patient once they are available. This patient will be a boarder until SNF placement is obtained.  Madilyn Fireman, MSW, LCSW-A Transitions of Care  Clinical Social Worker  [facility] Emergency Departments  Medical ICU [phone]

## 2019-10-14 ENCOUNTER — Other Ambulatory Visit: Payer: Self-pay

## 2019-10-14 NOTE — ED Notes (Signed)
Area around pt's G-Tube cleansed and new dressing applied. Pt spoke w/her brother on portable phone.

## 2019-10-14 NOTE — ED Notes (Signed)
Pt eating lunch. She has eaten few bites - continues to state she does not have an appetite and requesting crackers - given.

## 2019-10-14 NOTE — ED Notes (Signed)
Pt stated that she was not in pain but she was hot; she had several blankets on her eralier in the night.. She had kicked off the blankets. She requested water which was given to her. She was oriented x 3, vitals WDL

## 2019-10-14 NOTE — ED Notes (Addendum)
Pt sitting in chair in room. Attempting to eat breakfast. Pt took meds w/apple sauce. Pt noted to be calm, cooperative, pleasant - talkative. Pt states she eats and takes meds po and does not use g-tube. States she has poor appetite and does not want to be given feeding supplements via g-tube.

## 2019-10-14 NOTE — ED Notes (Addendum)
Joey, SW, advised he has bed offer to present to pt. Assisted pt back to bed from chair - pt ambulatory w/walker.

## 2019-10-14 NOTE — ED Notes (Signed)
Breakfast ordered 

## 2019-10-14 NOTE — ED Notes (Signed)
Pt. Was awoken to take vitals, she stated that she was not in pain and doing well. Vitals WDL

## 2019-10-14 NOTE — ED Notes (Signed)
States she wants to go to another SNF for rehab so came to ED.

## 2019-10-14 NOTE — ED Notes (Signed)
Pt given water as requested.

## 2019-10-14 NOTE — Progress Notes (Signed)
CSW received bed offer from [facility]. CSW spoke with [facility] admissions staff. When discussing transfer of patient to [facility] two factors came up; patient left last facility recently [place], and patient is approaching her co-oay days. [facility] rescinded bed offer. CSW will continue SNF bed hunt and will work with patient and treatment team on alternative disposition as needed.  Daphine Deutscher, LCSW, [place] Social Worker II Emergency Department, [place], [place] [phone]

## 2019-10-14 NOTE — ED Notes (Addendum)
Pt noted to be irritable - Offered for pt to eat breakfast - states she wants to be bathed first. Pt then c/o "food is always cold so I don't eat". States no one has been using her G Tube except for "1 can of feeding". States she takes meds in apple sauce. Pt in bathroom being bathed by NT d/t pt states she is too weak to bathe herself. Pt ambulated to bathroom w/walker and stand-by assistance.

## 2019-10-14 NOTE — ED Notes (Signed)
Checked on Pt. Counted respirations for 1 full minute @ 21. But does not appear to be in distress and not labored.

## 2019-10-15 NOTE — Care Management (Signed)
ED CM have sent [facility] referral to the following agencies [facility]

## 2019-10-15 NOTE — Care Management (Signed)
ED CM contacted by ED CSW concerning patient being discharged home with [facility] services. CSW spoke with family regarding finding a [place] agency who can accept  referrals with [facility] they are agreeable.  CM will widen the search for [facility] agency.  CM awaiting EDP to place orders.

## 2019-10-15 NOTE — ED Notes (Signed)
States she does not want to eat her lunch. States she wants to take it home w/her.

## 2019-10-15 NOTE — ED Notes (Signed)
Joey, SW, in w/pt.  

## 2019-10-15 NOTE — ED Notes (Addendum)
Pt's brother aware PTAR is present to transport pt home. Advised he is at pt's home waiting on her arrival. PTAR aware. Pt assisted w/changing into gown. Blanket noted in belongings bag - no other belongings noted - pt verified.

## 2019-10-15 NOTE — ED Notes (Signed)
Pt talking on phone w/her brother.

## 2019-10-15 NOTE — TOC Progression Note (Signed)
Transition of Care [facility]) - Progression Note    Patient Details  Name: Alyssa Odonnell MRN: [id number] Date of Birth: [birth year]  Transition of Care [facility]) CM/SW [place], LCSW Phone Number: 10/15/2019, 10:16 AM  Clinical Narrative:  CSW spoke with patient and her brother about insurance barriers to finding a new SNF placement. CSW noted patient and family verbalized understanding. CSW noted patient and brother agreed for [facility] referral and home discharge. CSW noted no preference for [facility] provider. CSW informed RNCM.     Expected Discharge Plan and Services                                                 Social Determinants of Health (SDOH) Interventions    Readmission Risk Interventions Readmission Risk Prevention Plan 10/02/2019  Post Dischage Appt Not Complete  Appt Comments plan for SNF  Medication Screening Complete  Transportation Screening Complete  Some recent data might be hidden

## 2019-10-15 NOTE — ED Notes (Signed)
Pt states she spoke w/her brother and is aware and in agreement w/tx plan - d/c to home w/HH.

## 2019-10-15 NOTE — ED Notes (Signed)
[facility] and requested [facility] for transport. Pt aware.

## 2019-10-15 NOTE — ED Notes (Signed)
Per Joey, SW, pt to be d/c'd home and CM to set up [facility]. Pt's brother advised he will meet her at home. Pt to be transported home via [facility].

## 2019-10-20 ENCOUNTER — Encounter (HOSPITAL_COMMUNITY): Payer: Self-pay | Admitting: Emergency Medicine

## 2019-10-20 ENCOUNTER — Emergency Department (HOSPITAL_COMMUNITY)
Admission: EM | Admit: 2019-10-20 | Discharge: 2019-11-06 | Disposition: A | Discharge status: Home / Self Care | Payer: Medicare Other | Attending: Emergency Medicine | Admitting: Emergency Medicine

## 2019-10-20 DIAGNOSIS — Z515 Encounter for palliative care: Secondary | ICD-10-CM

## 2019-10-20 DIAGNOSIS — E46 Unspecified protein-calorie malnutrition: Secondary | ICD-10-CM | POA: Diagnosis present

## 2019-10-20 DIAGNOSIS — Z8673 Personal history of transient ischemic attack (TIA), and cerebral infarction without residual deficits: Secondary | ICD-10-CM | POA: Insufficient documentation

## 2019-10-20 DIAGNOSIS — R627 Adult failure to thrive: Secondary | ICD-10-CM | POA: Diagnosis present

## 2019-10-20 DIAGNOSIS — E039 Hypothyroidism, unspecified: Secondary | ICD-10-CM | POA: Insufficient documentation

## 2019-10-20 DIAGNOSIS — E860 Dehydration: Secondary | ICD-10-CM

## 2019-10-20 DIAGNOSIS — I10 Essential (primary) hypertension: Secondary | ICD-10-CM | POA: Insufficient documentation

## 2019-10-20 DIAGNOSIS — K311 Adult hypertrophic pyloric stenosis: Secondary | ICD-10-CM | POA: Diagnosis present

## 2019-10-20 DIAGNOSIS — Z7901 Long term (current) use of anticoagulants: Secondary | ICD-10-CM | POA: Insufficient documentation

## 2019-10-20 DIAGNOSIS — Z602 Problems related to living alone: Secondary | ICD-10-CM | POA: Insufficient documentation

## 2019-10-20 DIAGNOSIS — R6251 Failure to thrive (child): Secondary | ICD-10-CM

## 2019-10-20 DIAGNOSIS — Z20822 Contact with and (suspected) exposure to covid-19: Secondary | ICD-10-CM | POA: Diagnosis not present

## 2019-10-20 DIAGNOSIS — Z66 Do not resuscitate: Secondary | ICD-10-CM | POA: Diagnosis present

## 2019-10-20 LAB — CBC WITH DIFFERENTIAL/PLATELET
Abs Immature Granulocytes: 0.03 10*3/uL (ref 0.00–0.07)
Basophils Absolute: 0 10*3/uL (ref 0.0–0.1)
Basophils Relative: 0 %
Eosinophils Absolute: 0 10*3/uL (ref 0.0–0.5)
Eosinophils Relative: 1 %
HCT: 37.5 % (ref 36.0–46.0)
Hemoglobin: 11.7 g/dL — ABNORMAL LOW (ref 12.0–15.0)
Immature Granulocytes: 0 %
Lymphocytes Relative: 27 %
Lymphs Abs: 2.3 10*3/uL (ref 0.7–4.0)
MCH: 28.3 pg (ref 26.0–34.0)
MCHC: 31.2 g/dL (ref 30.0–36.0)
MCV: 90.8 fL (ref 80.0–100.0)
Monocytes Absolute: 0.7 10*3/uL (ref 0.1–1.0)
Monocytes Relative: 8 %
Neutro Abs: 5.4 10*3/uL (ref 1.7–7.7)
Neutrophils Relative %: 64 %
Platelets: 244 10*3/uL (ref 150–400)
RBC: 4.13 MIL/uL (ref 3.87–5.11)
RDW: 15.2 % (ref 11.5–15.5)
WBC: 8.4 10*3/uL (ref 4.0–10.5)
nRBC: 0 % (ref 0.0–0.2)

## 2019-10-20 LAB — COMPREHENSIVE METABOLIC PANEL
ALT: 13 U/L (ref 0–44)
AST: 12 U/L — ABNORMAL LOW (ref 15–41)
Albumin: 3.1 g/dL — ABNORMAL LOW (ref 3.5–5.0)
Alkaline Phosphatase: 79 U/L (ref 38–126)
Anion gap: 13 (ref 5–15)
BUN: 11 mg/dL (ref 8–23)
CO2: 24 mmol/L (ref 22–32)
Calcium: 9.3 mg/dL (ref 8.9–10.3)
Chloride: 102 mmol/L (ref 98–111)
Creatinine, Ser: 1.34 mg/dL — ABNORMAL HIGH (ref 0.44–1.00)
GFR calc Af Amer: 42 mL/min — ABNORMAL LOW (ref >60)
GFR calc non Af Amer: 37 mL/min — ABNORMAL LOW (ref >60)
Glucose, Bld: 103 mg/dL — ABNORMAL HIGH (ref 70–99)
Potassium: 4 mmol/L (ref 3.5–5.1)
Sodium: 139 mmol/L (ref 135–145)
Total Bilirubin: 0.8 mg/dL (ref 0.3–1.2)
Total Protein: 6.5 g/dL (ref 6.5–8.1)

## 2019-10-20 LAB — LIPASE, BLOOD: Lipase: 24 U/L (ref 11–51)

## 2019-10-20 LAB — TSH: TSH: 9.805 u[IU]/mL — ABNORMAL HIGH (ref 0.350–4.500)

## 2019-10-20 LAB — LACTIC ACID, PLASMA: Lactic Acid, Venous: 1 mmol/L (ref 0.5–1.9)

## 2019-10-20 MED ORDER — BOOST / RESOURCE BREEZE PO LIQD CUSTOM
1.0000 | Freq: Once | ORAL | Status: DC
  Filled 2019-10-20 (×2): qty 1

## 2019-10-20 MED ORDER — ENSURE ENLIVE PO LIQD
237.0000 mL | Freq: Two times a day (BID) | ORAL | Status: DC
  Administered 2019-10-21 (×2): 237 mL
  Filled 2019-10-20 (×3): qty 237

## 2019-10-20 MED ORDER — CLOPIDOGREL BISULFATE 75 MG PO TABS
75.0000 mg | ORAL_TABLET | Freq: Every day | ORAL | Status: DC
  Administered 2019-10-21 – 2019-11-06 (×17): 75 mg via ORAL
  Filled 2019-10-20 (×20): qty 1

## 2019-10-20 MED ORDER — METOCLOPRAMIDE HCL 5 MG/ML IJ SOLN
10.0000 mg | Freq: Once | INTRAMUSCULAR | Status: AC
  Administered 2019-10-20: 10 mg via INTRAVENOUS
  Filled 2019-10-20: qty 2

## 2019-10-20 MED ORDER — PRO-STAT SUGAR FREE PO LIQD
30.0000 mL | Freq: Two times a day (BID) | ORAL | Status: DC
  Administered 2019-10-20 – 2019-11-06 (×12): 30 mL via ORAL
  Filled 2019-10-20 (×18): qty 30

## 2019-10-20 MED ORDER — PANTOPRAZOLE SODIUM 40 MG PO PACK
40.0000 mg | PACK | Freq: Every day | ORAL | Status: DC
  Administered 2019-10-20 – 2019-11-06 (×17): 40 mg via ORAL
  Filled 2019-10-20 (×21): qty 20

## 2019-10-20 MED ORDER — LACTATED RINGERS IV BOLUS
1000.0000 mL | Freq: Once | INTRAVENOUS | Status: AC
  Administered 2019-10-20: 1000 mL via INTRAVENOUS

## 2019-10-20 MED ORDER — LORAZEPAM 0.5 MG PO TABS
0.5000 mg | ORAL_TABLET | ORAL | Status: DC
  Administered 2019-10-26: 0.5 mg via ORAL
  Filled 2019-10-20: qty 1

## 2019-10-20 MED ORDER — LATANOPROST 0.005 % OP SOLN
1.0000 [drp] | Freq: Every day | OPHTHALMIC | Status: DC
  Administered 2019-10-20 – 2019-11-05 (×15): 1 [drp] via OPHTHALMIC
  Filled 2019-10-20 (×3): qty 2.5

## 2019-10-20 MED ORDER — ADULT MULTIVITAMIN W/MINERALS CH
1.0000 | ORAL_TABLET | Freq: Every day | ORAL | Status: DC
  Administered 2019-10-20 – 2019-11-06 (×18): 1 via ORAL
  Filled 2019-10-20 (×19): qty 1

## 2019-10-20 MED ORDER — LEVOTHYROXINE SODIUM 100 MCG PO TABS
100.0000 ug | ORAL_TABLET | Freq: Every day | ORAL | Status: DC
  Administered 2019-10-21 – 2019-11-06 (×17): 100 ug via ORAL
  Filled 2019-10-20 (×18): qty 1

## 2019-10-20 MED ORDER — ONDANSETRON 4 MG PO TBDP
4.0000 mg | ORAL_TABLET | Freq: Three times a day (TID) | ORAL | Status: DC | PRN
  Administered 2019-10-20 – 2019-10-31 (×6): 4 mg via ORAL
  Filled 2019-10-20 (×7): qty 1

## 2019-10-20 MED ORDER — FREE WATER
100.0000 mL | Freq: Three times a day (TID) | Status: DC
  Administered 2019-10-20 – 2019-10-25 (×11): 100 mL
  Administered 2019-10-25: 50 mL
  Administered 2019-10-25 – 2019-11-06 (×33): 100 mL

## 2019-10-20 MED ORDER — PANTOPRAZOLE SODIUM 40 MG PO TBEC
40.0000 mg | DELAYED_RELEASE_TABLET | Freq: Every day | ORAL | Status: DC
  Filled 2019-10-20: qty 1

## 2019-10-20 MED ORDER — APIXABAN 5 MG PO TABS
5.0000 mg | ORAL_TABLET | Freq: Two times a day (BID) | ORAL | Status: DC
  Administered 2019-10-20 – 2019-11-06 (×31): 5 mg via ORAL
  Filled 2019-10-20 (×38): qty 1

## 2019-10-20 MED ORDER — AMIODARONE HCL 200 MG PO TABS
200.0000 mg | ORAL_TABLET | Freq: Every day | ORAL | Status: DC
  Administered 2019-10-20 – 2019-11-06 (×18): 200 mg via ORAL
  Filled 2019-10-20 (×19): qty 1

## 2019-10-20 NOTE — Progress Notes (Signed)
Consult request has been received. CSW attempting to follow up at present time.  Marylou Flesher, LCSW, [place] Social Worker [phone]

## 2019-10-20 NOTE — ED Notes (Signed)
Pt provided with phone to converse with social work.

## 2019-10-20 NOTE — Progress Notes (Addendum)
CSW spoke with pt and confirmed pt's plan to be discharged to a SNF for rehab at discharge.  CSW provided active listening and validated pt's concerns that she go to a facility to.   CSW was given verbal permission from the pt to and will complete FL-2 and send referrals out to [name] area SNF facilities via the hub per pt's request.  Pt has been living independently prior to being admitted to [name].  Per the pt, her brother Alyssa Odonnell who is her POA, can be called if help is needed with her disposition.  Of note: Per TOC email of recent, currently there is a waiver for a [name] Medicare aut h so pt can go to a SNF without an  authorization.  Also of note: Pt previously was at [name] and before that [name] and is not likely to want to return.  CSW will continue to follow for D/C needs.  [name]. [name], LCSW, LCAS, CSI Transitions of Care Clinical Social Worker Care Coordination Department Ph: [phone]

## 2019-10-20 NOTE — ED Notes (Signed)
Patient was yelling and can be heard in the hallways cursing at staff and demanding RN; When RN asked patient what was needed patient could not articulate what she needed the RN for but begin talking about care at other facilities and how she is not been cared for; RN spoke to patient about her behavior and how she has been treating staff; patient asked not to cures at this RN-[name],RN

## 2019-10-20 NOTE — TOC Initial Note (Addendum)
Transition of Care [facility]) - Initial/Assessment Note    Patient Details  Name: Alyssa Odonnell MRN: [id number] Date of Birth: May 18, 1934  Transition of Care [facility]) CM/SW Contact:    Claudine Mouton, LCSW Phone Number: 10/20/2019, 7:31 PM  Clinical Narrative:    CSW spoke with pt and confirmed pt's plan to be discharged to a SNF for rehab at discharge.  CSW provided active listening and validated pt's concerns that she go to a facility to receive rehab.   CSW was given verbal permission from the pt to and will complete FL-2 and send referrals out to [facility] area SNF facilities via the hub per pt's request.  Pt has been living independently prior to being admitted to [facility] ED.  Per the pt, her brother Olga Millers who is her POA, can be called if help is needed with her disposition.  Of note: Per TOC email of recent, currently there is a waiver for a [facility] Medicare aut h so pt can go to a SNF without an  authorization.  Also of note: Pt previously was at [facility] and before that [name] and is not likely to want to return.               Expected Discharge Plan: Skilled Nursing Facility Barriers to Discharge: No SNF bed   Patient Goals and CMS Choice Patient states their goals for this hospitalization and ongoing recovery are:: To "turn myself around and get better".      Expected Discharge Plan and Services Expected Discharge Plan: [place] arrangements for the past 2 months: Single Family Home                                      Prior Living Arrangements/Services Living arrangements for the past 2 months: Single Family Home Lives with:: Self          Need for Family Participation in Patient Care: No (Comment) Care giver support system in place?: No (comment)      Activities of Daily Living      Permission Sought/Granted Permission sought to share information with : Facility Art therapist granted to share  information with : Yes, Verbal Permission Granted              Emotional Assessment     Affect (typically observed): Adaptable, Appropriate, Calm, Stoic Orientation: : Oriented to Self, Oriented to Place, Oriented to  Time, Oriented to Situation      Admission diagnosis:  Failure to Thrive Patient Active Problem List   Diagnosis Date Noted  . Palliative care by specialist   . DNR (do not resuscitate)   . Malnutrition of moderate degree 10/02/2019  . Pressure injury of skin 10/01/2019  . Malnutrition ([place]) 10/01/2019  . Hypothyroid 10/01/2019  . Failure to thrive in adult 09/30/2019  . Pleural effusion, bilateral 09/30/2019  . Chronic anticoagulation 09/30/2019  . 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 Gastric outlet obstruction 08/29/2019  . PAF (paroxysmal atrial fibrillation) ([place]) 02/02/2018  . 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 Cerebrovascular accident (CVA) due to embolism of left posterior cerebral artery ([place]) 03/09/2017  . htn 03/09/2017  . Generalized weakness 03/09/2017  . Stroke-like symptom 03/09/2017  . Right sided weakness   . PAD (peripheral artery disease) ([place])   . Essential hypertension   . Spinal stenosis of lumbar region   .  Pure hypercholesterolemia   . Recurrent major depressive disorder ([place])    PCP:  Drake Leach, MD Pharmacy:  No Pharmacies Listed    Social Determinants of Health (SDOH) Interventions    Readmission Risk Interventions Readmission Risk Prevention Plan 10/02/2019  Post Dischage Appt Not Complete  Appt Comments plan for SNF  Medication Screening Complete  Transportation Screening Complete  Some recent data might be hidden

## 2019-10-20 NOTE — NC FL2 (Signed)
[place] LEVEL OF CARE SCREENING TOOL     IDENTIFICATION  Patient Name: Alyssa Odonnell Birthdate: 11-13-1933 Sex: female Admission Date (Current Location): 10/20/2019  [facility] and [place] Number:  Herbalist and Address:  [facility], [place] [address], [place], [place] [address]      Provider Number: [id number]  Attending Physician Name and Address:  Default, Provider, MD  Relative Name and Phone Number:       Current Level of Care: Hospital Recommended Level of Care: [place] Prior Approval Number:    Date Approved/Denied:   PASRR Number: [id number]  Discharge Plan: SNF    Current Diagnoses: Patient Active Problem List   Diagnosis Date Noted  . Palliative care by specialist   . DNR (do not resuscitate)   . Malnutrition of moderate degree 10/02/2019  . Pressure injury of skin 10/01/2019  . Malnutrition ([place]) 10/01/2019  . Hypothyroid 10/01/2019  . Failure to thrive in adult 09/30/2019  . Pleural effusion, bilateral 09/30/2019  . Chronic anticoagulation 09/30/2019  . 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 Gastric outlet obstruction 08/29/2019  . PAF (paroxysmal atrial fibrillation) ([place]) 02/02/2018  . 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 Cerebrovascular accident (CVA) due to embolism of left posterior cerebral artery ([place]) 03/09/2017  . htn 03/09/2017  . Generalized weakness 03/09/2017  . Stroke-like symptom 03/09/2017  . Right sided weakness   . PAD (peripheral artery disease) ([place])   . Essential hypertension   . Spinal stenosis of lumbar region   . Pure hypercholesterolemia   . Recurrent major depressive disorder (HCC)     Orientation RESPIRATION BLADDER Height & Weight     Self, Time, Situation, Place  Normal Continent Weight: 152 lb 12.5 oz (69.3 kg) Height:     BEHAVIORAL SYMPTOMS/MOOD NEUROLOGICAL BOWEL NUTRITION STATUS      Continent Diet(Appropriate to eat either via PEG tube or by  mouth in small amounts, per RN (crackers))  AMBULATORY STATUS COMMUNICATION OF NEEDS Skin   Limited Assist Verbally Normal(Recent surgery to get PEG tube place, but is healing)                       Personal Care Assistance Level of Assistance  Bathing, Dressing Bathing Assistance: Limited assistance Feeding assistance: Independent Dressing Assistance: Limited assistance     Functional Limitations Info    Sight Info: Adequate Hearing Info: Adequate Speech Info: Adequate    SPECIAL CARE FACTORS FREQUENCY        PT Frequency: 5 OT Frequency: 5            Contractures Contractures Info: Not present    Additional Factors Info  Code Status, Allergies Code Status Info: DNR Allergies Info: Aspirin, Atorvastatin, Nabumetone, Nsaids, Pravastatin, Procaine, Rosuvastatin, Simvastatin, Statins, Evolocumab, Mobic Meloxicam, Penicillins           Current Medications (10/20/2019):  This is the current hospital active medication list Current Facility-Administered Medications  Medication Dose Route Frequency Provider Last Rate Last Admin  . amiodarone (PACERONE) tablet 200 mg  200 mg Oral Daily Plunkett, Whitney, MD      . apixaban (ELIQUIS) tablet 5 mg  5 mg Oral BID [name], MD      . Derrill Memo ON 10/21/2019] clopidogrel (PLAVIX) tablet 75 mg  75 mg Oral Q breakfast [name], MD      . feeding supplement (BOOST / RESOURCE BREEZE) liquid 1 Container  1 Container Oral  Once [name], MD      . feeding supplement (BOOST BREEZE) LIQD 1 Dose  1 Dose Per Tube BID [name], MD      . feeding supplement (PRO-STAT SUGAR FREE 64) liquid 30 mL  30 mL Oral BID [name], MD      . free water 100 mL  100 mL Per Tube Q8H Plunkett, Whitney, MD      . latanoprost (XALATAN) 0.005 % ophthalmic solution 1 drop  1 drop Both Eyes QHS [name], MD      . Derrill Memo ON 10/21/2019] levothyroxine (SYNTHROID) tablet 100 mcg  100 mcg Oral QAC breakfast [name], MD      . LORazepam (ATIVAN) tablet 0.5 mg  0.5 mg Oral See admin instructions [name], MD      . multivitamin with minerals tablet 1 tablet  1 tablet Oral Daily Plunkett, Whitney, MD      . ondansetron (ZOFRAN-ODT) disintegrating tablet 4 mg  4 mg Oral Q8H PRN [name], MD      . pantoprazole sodium (PROTONIX) 40 mg/20 mL oral suspension 40 mg  40 mg Oral Daily Gillian Scarce, [facility]       Current Outpatient Medications  Medication Sig Dispense Refill  . Amino Acids-Protein Hydrolys (FEEDING SUPPLEMENT, PRO-STAT SUGAR FREE 64,) LIQD Take 30 mLs by mouth 2 (two) times daily.    Marland Kitchen amiodarone (PACERONE) 200 MG tablet Take 1 tablet (200 mg total) by mouth daily. 30 tablet 0  . clopidogrel (PLAVIX) 75 MG tablet Take 1 tablet (75 mg total) by mouth daily with breakfast. 30 tablet 0  . ELIQUIS 5 MG TABS tablet TAKE 1 TABLET BY MOUTH TWO  TIMES DAILY (Patient taking differently: Take 5 mg by mouth 2 (two) times daily. ) 60 tablet 0  . latanoprost (XALATAN) 0.005 % ophthalmic solution Place 1 drop into both eyes at bedtime.    Marland Kitchen levothyroxine (SYNTHROID, LEVOTHROID) 100 MCG tablet Take 100 mcg by mouth daily before breakfast.     . lidocaine (LIDODERM) 5 % Place 1 patch onto the skin daily. Remove & Discard patch within 12 hours or as directed by MD 5 patch 0  . LORazepam (ATIVAN) 0.5 MG tablet Take 0.5 mg by mouth See admin instructions. Take one tablet (0.5 mg) by mouth daily at bedtime, may also take one tablet (0.5 mg) every 6 hours as needed for anxiety    . Multiple Vitamin (MULTIVITAMIN WITH MINERALS) TABS tablet Take 1 tablet by mouth daily.    . Nutritional Supplements (FEEDING SUPPLEMENT, BOOST BREEZE,) LIQD Place 1 Dose into feeding tube 2 (two) times daily.    Marland Kitchen omeprazole (PRILOSEC) 20 MG capsule Take 20 mg by mouth daily.     . ondansetron (ZOFRAN ODT) 4 MG disintegrating tablet Take 1 tablet (4 mg total) by mouth every 8 (eight) hours as needed for nausea or  vomiting. 20 tablet 0  . simethicone (MYLICON) 0000000 MG chewable tablet Chew 125 mg by mouth every 6 (six) hours as needed (gas pain).    . traMADol (ULTRAM) 50 MG tablet Take by mouth every 12 (twelve) hours as needed (pain).    . triamcinolone cream (KENALOG) 0.1 % Apply 1 application topically daily as needed (itching).     . Water For Irrigation, Sterile (FREE WATER) SOLN Place 100 mLs into feeding tube every 8 (eight) hours.       Discharge Medications: Please see discharge summary for a list of discharge medications.  Relevant Imaging Results:  Relevant Lab Results:   Additional Information [id number]  [name] Taje Tondreau, LCSW

## 2019-10-20 NOTE — Progress Notes (Addendum)
Pt's last COVID test (pt was negative) was on 12/24 so EDP is agreeable to ordering a 6-hour COVID test so it can be determined if pt is appropriate for SNF placement with pt's updated medical situation (pt too weak to ambulate well and recent 40ib weight loss).  CSW will continue to follow for D/C needs.  [name]. Bret Stamour, LCSW, LCAS, CSI Transitions of Care Clinical Social Worker Care Coordination Department Ph: [phone]

## 2019-10-20 NOTE — ED Provider Notes (Signed)
Alyssa Odon[facility] EMERGENCY DEPARTMENT Provider Note   CSN: [id number] Arrival date & time: 10/20/19  1126     History No chief complaint on file.   Alyssa Odonnell is a 86 y.o. female.  86 y.o.femalewith mmp includinghypertension, hyperlipidemia, hiatal hernia with gastric outlet obstruction and volvulus recently hospitalized from 11/9-12/8 requiring laparoscopic gastric plexi on 09/01/2019 with placement of gastrostomy tube and lysis of adhesions. Subsequently, she developed acute ischemic left fourth and fifth toesrequiring arteriogram and stent to the left SFA and balloon angioplasty of the trunk on 11/23 by vascular surgery, PAF on Eliquis, andCVA who is presenting today because she is not able to care for herself at home.  Patient does have a history since being discharged from the hospital that she went to a rehab facility and signed out AMA because she did not feel like she was being cared for appropriately.  She went home for a short period of time but was unable to care for herself and came back to the hospital and was then sent to [facility] rehab.  She was there for short time but also was not happy with the situation and left AMA.  She was last seen in the emergency room on 10/12/2019 due to inability to take care of herself at home.  And they were unable to find placement and she was sent home on 10/15/2019 to get home services.  Patient states since that time nobody from home health has arrived at her house.  She does not know how to do the feeds through her tube and she has been eating very little.  She still has intermittent nausea and states she now feels generally weak.  She does not feel that she can care for herself at home and does need to be in a rehab facility however during her last evaluation they were having difficulty finding placement.  Patient denies any shortness of breath, cough or fevers.  She has no localized abdominal pain.  She is not vomiting and her  last bowel movement was yesterday but states she has very little stool.  She also reports that she has lost between 35 and 40 pounds in the last month and she just cannot eat.  Also patient lost her daughter on 10/04/2019 and that has been very hard for her as they live together and now she is living alone.  She does report she has been taking her home medications but over the last 2 weeks has not been taking them regularly.        Past Medical History:  Diagnosis Date   Gastric outlet obstruction 08/2019   Glaucoma    Hyperlipidemia    Hypertension    Hypothyroid    PAF (paroxysmal atrial fibrillation) ([place])    Stroke [facility])     Patient Active Problem List   Diagnosis Date Noted   Palliative care by specialist    DNR (do not resuscitate)    Malnutrition of moderate degree 10/02/2019   Pressure injury of skin 10/01/2019   Malnutrition ([place]) 10/01/2019   Hypothyroid 10/01/2019   Failure to thrive in adult 09/30/2019   Pleural effusion, bilateral 09/30/2019   Chronic anticoagulation 09/30/2019   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   Gastric outlet obstruction 08/29/2019   PAF (paroxysmal atrial fibrillation) ([place]) 02/02/2018   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[place]) 05/24/2017   Cerebrovascular accident (CVA) due to embolism of left posterior cerebral artery ([place]) 03/09/2017   htn 03/09/2017   Generalized weakness 03/09/2017  Stroke-like symptom 03/09/2017   Right sided weakness    PAD (peripheral artery disease) ([place])    Essential hypertension    Spinal stenosis of lumbar region    Pure hypercholesterolemia    Recurrent major depressive disorder [facility])     Past Surgical History:  Procedure Laterality Date   ABDOMINAL AORTOGRAM W/LOWER EXTREMITY Right 09/11/2019   Procedure: ABDOMINAL AORTOGRAM W/LOWER EXTREMITY;  Surgeon: Waynetta Sandy, MD;  Location: [place] CV LAB;  Service: Cardiovascular;  Laterality: Right;   ABDOMINAL HERNIA REPAIR      GASTROSTOMY N/A 09/01/2019   Procedure: INSERTION OF GASTROSTOMY TUBE;  Surgeon: Erroll Luna, MD;  Location: [place];  Service: General;  Laterality: N/A;   LAPAROSCOPIC LYSIS OF ADHESIONS  09/01/2019   Procedure: Laparoscopic Lysis Of Adhesions;  Surgeon: Erroll Luna, MD;  Location: [place];  Service: General;;   LAPAROSCOPIC NISSEN FUNDOPLICATION N/A [id number]   Procedure: LAPAROSCOPIC, POSSIBLE OPEN, GASTROPEXY;  Surgeon: Erroll Luna, MD;  Location: [place];  Service: General;  Laterality: N/A;   LOOP RECORDER INSERTION N/A 03/11/2017   Procedure: Loop Recorder Insertion;  Surgeon: Evans Lance, MD;  Location: [place] CV LAB;  Service: Cardiovascular;  Laterality: N/A;   PERIPHERAL VASCULAR BALLOON ANGIOPLASTY Left 09/11/2019   Procedure: PERIPHERAL VASCULAR BALLOON ANGIOPLASTY;  Surgeon: Waynetta Sandy, MD;  Location: [place] CV LAB;  Service: Cardiovascular;  Laterality: Left;  TP trunk   PERIPHERAL VASCULAR CATHETERIZATION Right 07/06/2016   Procedure: Lower Extremity Angiography;  Surgeon: Angelia Mould, MD;  Location: [place] CV LAB;  Service: Cardiovascular;  Laterality: Right;   PERIPHERAL VASCULAR CATHETERIZATION Right 07/06/2016   Procedure: Peripheral Vascular Intervention;  Surgeon: Angelia Mould, MD;  Location: [place] CV LAB;  Service: Cardiovascular;  Laterality: Right;  Superficial femoral   PERIPHERAL VASCULAR INTERVENTION Left 09/11/2019   Procedure: PERIPHERAL VASCULAR INTERVENTION;  Surgeon: Waynetta Sandy, MD;  Location: [place] CV LAB;  Service: Cardiovascular;  Laterality: Left;  SFA   TEE WITHOUT CARDIOVERSION N/A 03/11/2017   Procedure: TRANSESOPHAGEAL ECHOCARDIOGRAM (TEE);  Surgeon: Acie Fredrickson Wonda Cheng, MD;  Location: [facility] ENDOSCOPY;  Service: Cardiovascular;  Laterality: N/A;   TOTAL HIP ARTHROPLASTY       OB History   No obstetric history on file.     Family History  Problem Relation Age  of Onset   Stroke Father     Social History   Tobacco Use   Smoking status: Never Smoker   Smokeless tobacco: Never Used  Substance Use Topics   Alcohol use: No   Drug use: No    Home Medications Prior to Admission medications   Medication Sig Start Date End Date Taking? Authorizing Provider  Amino Acids-Protein Hydrolys (FEEDING SUPPLEMENT, PRO-STAT SUGAR FREE 64,) LIQD Take 30 mLs by mouth 2 (two) times daily.    [provider]  amiodarone (PACERONE) 200 MG tablet Take 1 tablet (200 mg total) by mouth daily. 09/26/19 10/26/19  Donne Hazel, MD  clopidogrel (PLAVIX) 75 MG tablet Take 1 tablet (75 mg total) by mouth daily with breakfast. 09/26/19 10/26/19  Donne Hazel, MD  ELIQUIS 5 MG TABS tablet TAKE 1 TABLET BY MOUTH TWO  TIMES DAILY Patient taking differently: Take 5 mg by mouth 2 (two) times daily.  03/14/19   Evans Lance, MD  latanoprost (XALATAN) 0.005 % ophthalmic solution Place 1 drop into both eyes at bedtime. 05/11/16   [provider]  levothyroxine (SYNTHROID, LEVOTHROID) 100 MCG tablet Take  100 mcg by mouth daily before breakfast.  05/11/16   [provider]  lidocaine (LIDODERM) 5 % Place 1 patch onto the skin daily. Remove & Discard patch within 12 hours or as directed by MD 09/25/19   Donne Hazel, MD  LORazepam (ATIVAN) 0.5 MG tablet Take 0.5 mg by mouth See admin instructions. Take one tablet (0.5 mg) by mouth daily at bedtime, may also take one tablet (0.5 mg) every 6 hours as needed for anxiety    [provider]  Multiple Vitamin (MULTIVITAMIN WITH MINERALS) TABS tablet Take 1 tablet by mouth daily. 10/06/19   Georgette Shell, MD  Nutritional Supplements (FEEDING SUPPLEMENT, BOOST BREEZE,) LIQD Place 1 Dose into feeding tube 2 (two) times daily.    [provider]  omeprazole (PRILOSEC) 20 MG capsule Take 20 mg by mouth daily.  08/05/16   [provider]  ondansetron (ZOFRAN ODT) 4 MG  disintegrating tablet Take 1 tablet (4 mg total) by mouth every 8 (eight) hours as needed for nausea or vomiting. 10/12/19   Long, Wonda Olds, MD  simethicone (MYLICON) 0000000 MG chewable tablet Chew 125 mg by mouth every 6 (six) hours as needed (gas pain).    [provider]  traMADol (ULTRAM) 50 MG tablet Take by mouth every 12 (twelve) hours as needed (pain).    [provider]  triamcinolone cream (KENALOG) 0.1 % Apply 1 application topically daily as needed (itching).     [provider]  Water For Irrigation, Sterile (FREE WATER) SOLN Place 100 mLs into feeding tube every 8 (eight) hours.    [provider]    Allergies    Aspirin, Atorvastatin, Nabumetone, Nsaids, Pravastatin, Procaine, Rosuvastatin, Simvastatin, Statins, Evolocumab, Mobic [meloxicam], and Penicillins  Review of Systems   Review of Systems  All other systems reviewed and are negative.   Physical Exam Updated Vital Signs BP (!) 162/100 (BP Location: Right Arm)    Pulse (!) 114    Temp 98.6 F (37 C) (Oral)    Resp 20    Wt 69.3 kg    SpO2 97%    BMI 27.06 kg/m   Physical Exam Vitals and nursing note reviewed.  Constitutional:      General: She is not in acute distress.    Appearance: She is well-developed and normal weight.  HENT:     Head: Normocephalic and atraumatic.     Mouth/Throat:     Mouth: Mucous membranes are dry.  Eyes:     Conjunctiva/sclera: Conjunctivae normal.     Pupils: Pupils are equal, round, and reactive to light.  Cardiovascular:     Rate and Rhythm: Regular rhythm. Tachycardia present.     Heart sounds: No murmur.  Pulmonary:     Effort: Pulmonary effort is normal. No respiratory distress.     Breath sounds: Normal breath sounds. No wheezing or rales.  Abdominal:     General: There is no distension.     Palpations: Abdomen is soft.     Tenderness: There is no abdominal tenderness. There is no guarding or rebound.     Comments: PEG tube placement in  the left upper quadrant clean dry and intact with no surrounding drainage  Musculoskeletal:        General: No tenderness. Normal range of motion.     Cervical back: Normal range of motion and neck supple.  Skin:    General: Skin is warm and dry.     Capillary Refill: Capillary  refill takes 2 to 3 seconds.     Findings: No erythema or rash.     Comments: Poor skin turgor  Neurological:     General: No focal deficit present.     Mental Status: She is alert and oriented to person, place, and time. Mental status is at baseline.  Psychiatric:        Mood and Affect: Mood normal.        Behavior: Behavior normal.        Thought Content: Thought content normal.     Comments: Tearful when she talks about her daughter     ED Results / Procedures / Treatments   Labs (all labs ordered are listed, but only abnormal results are displayed) Labs Reviewed  CBC WITH DIFFERENTIAL/PLATELET - Abnormal; Notable for the following components:      Result Value   Hemoglobin 11.7 (*)    All other components within normal limits  COMPREHENSIVE METABOLIC PANEL - Abnormal; Notable for the following components:   Glucose, Bld 103 (*)    Creatinine, Ser 1.34 (*)    Albumin 3.1 (*)    AST 12 (*)    GFR calc non Af Amer 37 (*)    GFR calc Af Amer 42 (*)    All other components within normal limits  LIPASE, BLOOD  LACTIC ACID, PLASMA  TSH    EKG EKG Interpretation  Date/Time:  Friday October 20 2019 12:22:46 EST Ventricular Rate:  102 PR Interval:    QRS Duration: 68 QT Interval:  426 QTC Calculation: 555 R Axis:   43 Text Interpretation: Sinus tachycardia Borderline T abnormalities, anterior leads Prolonged QT interval No significant change since last tracing Confirmed by [name] [phone]) on 10/20/2019 12:49:38 PM   Radiology No results found.  Procedures Procedures (including critical care time)  Medications Ordered in ED Medications  lactated ringers bolus 1,000 mL (has no  administration in time range)  metoCLOPramide (REGLAN) injection 10 mg (has no administration in time range)    ED Course  I have reviewed the triage vital signs and the nursing notes.  Pertinent labs & imaging results that were available during my care of the patient were reviewed by me and considered in my medical decision making (see chart for details).    MDM Rules/Calculators/A&P                     Elderly female with a complicated medical history presenting today with failure to thrive.  Patient has not been able to take care of herself at home since leaving the emergency room on 10/15/2019.  She also states nobody has been to her home to help her.  Patient should be receiving tube feeds but states she does not know how to do this.  She has not been eating much by mouth and states she is just starving to death at home.  She denies any shortness of breath, chest or abdominal pain.  She does complain of being profoundly weak.  On exam patient appears dehydrated with poor skin turgor but no localized abdominal tenderness or concern for acute abdominal pathology.  Lower suspicion for infection as well as patient denies any infectious symptoms.  Vital signs show hypertension and tachycardia but patient admits to not taking her medications regularly.  Will give patient IV fluids and recheck labs.  Patient and her family feel that she needs to be in rehab however unclear if this is even an option as patient has  left 2 facilities AMA and it was difficult finding placement during her last emergency room visit.  Will consult case management.  2:30 PM Patient's labs today without significant changes.  However spoke with patient's brother who has been trying to take care of her at home.  He confirms that nobody has arrived at the home the patient lives alone and he being elderly himself is no longer able to take care of her.  He states it is too much for he and his wife and she needs long-term placement.   Patient also agrees that she is not able to take care of herself at home.  Brother states that she is only taken medications once in the last few days.  Patient states it just makes her feel nauseated.  At this time we will continue home meds, Ensure through the PEG tube and look for placement.  Brother feels that she needs skilled nursing.  Final Clinical Impression(s) / ED Diagnoses Final diagnoses:  Failure to thrive (0-17)  Dehydration    Rx / DC Orders ED Discharge Orders    None       [name], MD 10/20/19 1431

## 2019-10-20 NOTE — Evaluation (Signed)
Physical Therapy Evaluation Patient Details Name: Alyssa Odonnell MRN: [id number] DOB: December 11, 1933 Today's Date: 10/20/2019   History of Present Illness  86 yo female admitted to ED on 1/1 from home with FTT, 40 lb weight loss in the past 3 weeks per report. Pt with recent SNF stay, apparently left AMA. PMH - HTN, PAF,glaucoma, CVA, gastric outlet obstruction with G tube placed.  Clinical Impression   Pt presents with generalized weakness, fair sitting balance, difficulty performing bed mobility, and inability to progress OOB due to fatigue and hunger. Pt to benefit from acute PT to address deficits. Pt required mod assist for supine<>sit, and sat EOB unsupported ~10 minutes. Pt is very weak, and per pt her brother and sister-in-law will not be able to care for her any longer because "I am making the well sick". Pt will need 24/7 assist for safety and mobility. PT to progress mobility as tolerated, and will continue to follow acutely.      Follow Up Recommendations SNF    Equipment Recommendations  None recommended by PT    Recommendations for Other Services       Precautions / Restrictions Precautions Precautions: Fall Precaution Comments: peg Restrictions Weight Bearing Restrictions: No      Mobility  Bed Mobility Overal bed mobility: Needs Assistance Bed Mobility: Supine to Sit;Sit to Supine     Supine to sit: Mod assist Sit to supine: Mod assist   General bed mobility comments: mod assist for trunk and LE management, scooting to and from EOB.  Transfers                 General transfer comment: not tried - pt declines due to feeling weak  Ambulation/Gait                Stairs            Wheelchair Mobility    Modified Rankin (Stroke Patients Only)       Balance Overall balance assessment: Needs assistance Sitting-balance support: Feet unsupported;Bilateral upper extremity supported Sitting balance-Leahy Scale: Fair Sitting balance -  Comments: sat EOB ~10 minuntes without difficulty                                     Pertinent Vitals/Pain Pain Assessment: Faces Faces Pain Scale: Hurts little more Pain Location: R knee Pain Descriptors / Indicators: Discomfort;Aching Pain Intervention(s): Limited activity within patient's tolerance;Monitored during session;Repositioned    Home Living Family/patient expects to be discharged to:: Private residence Living Arrangements: Alone Available Help at Discharge: Family;Available 24 hours/day(pt reports her brother and sister in law have been taking care of her 24/7 since d/c from hospital on 12/27) Type of Home: House Home Access: Stairs to enter Entrance Stairs-Rails: Right Entrance Stairs-Number of Steps: 4 Home Layout: Two level;Able to live on main level with bedroom/bathroom Home Equipment: Gilford Rile - 2 wheels;Walker - 4 wheels;Bedside commode;Cane - single point      Prior Function Level of Independence: Independent with assistive device(s);Needs assistance   Gait / Transfers Assistance Needed: Pt reports using RW to ambulate, but states she is too weak right now to mobilize.  ADL's / Homemaking Assistance Needed: Pt reports her brother and sister-in-law have been fixing food for her. Pt states she lost her daughter who lived with her in December 2020, and was tearful stating "we took care of each other"  Hand Dominance   Dominant Hand: Right    Extremity/Trunk Assessment   Upper Extremity Assessment Upper Extremity Assessment: Generalized weakness    Lower Extremity Assessment Lower Extremity Assessment: Generalized weakness    Cervical / Trunk Assessment Cervical / Trunk Assessment: Kyphotic  Communication   Communication: No difficulties  Cognition Arousal/Alertness: Awake/alert Behavior During Therapy: WFL for tasks assessed/performed Overall Cognitive Status: No family/caregiver present to determine baseline cognitive  functioning Area of Impairment: Memory;Problem solving                     Memory: Decreased short-term memory       Problem Solving: Requires verbal cues;Requires tactile cues;Decreased initiation General Comments: Pt tearful about daughter passing, and angry about recent treatment in SNF and hospital, stating no one is feeding her orally or through her G tube      General Comments General comments (skin integrity, edema, etc.): VSS    Exercises     Assessment/Plan    PT Assessment Patient needs continued PT services  PT Problem List Decreased strength;Decreased activity tolerance;Decreased balance;Decreased mobility;Decreased knowledge of use of DME;Decreased knowledge of precautions;Decreased cognition;Decreased safety awareness;Pain       PT Treatment Interventions DME instruction;Gait training;Stair training;Functional mobility training;Therapeutic activities;Therapeutic exercise;Balance training;Patient/family education    PT Goals (Current goals can be found in the Care Plan section)  Acute Rehab PT Goals Patient Stated Goal: get rehab, get fed PT Goal Formulation: With patient Time For Goal Achievement: 11/03/19 Potential to Achieve Goals: Good    Frequency Min 2X/week   Barriers to discharge Decreased caregiver support      Co-evaluation               AM-PAC PT "6 Clicks" Mobility  Outcome Measure Help needed turning from your back to your side while in a flat bed without using bedrails?: A Lot Help needed moving from lying on your back to sitting on the side of a flat bed without using bedrails?: A Lot Help needed moving to and from a bed to a chair (including a wheelchair)?: Total Help needed standing up from a chair using your arms (e.g., wheelchair or bedside chair)?: Total Help needed to walk in hospital room?: Total Help needed climbing 3-5 steps with a railing? : Total 6 Click Score: 8    End of Session   Activity Tolerance: Patient  limited by fatigue Patient left: in bed;with call bell/phone within reach;Other (comment)(with sandwich, RN notified and agreed with giving pt sandwich) Nurse Communication: Mobility status PT Visit Diagnosis: Other abnormalities of gait and mobility (R26.89);Muscle weakness (generalized) (M62.81)    Time: [id number] PT Time Calculation (min) (ACUTE ONLY): 22 min   Charges:   PT Evaluation $PT Eval Low Complexity: 1 Low         Meliya Mcconahy E, PT Acute Rehabilitation Services Pager [phone]  Office [phone]  Tai Syfert D Elonda Husky 10/20/2019, 5:39 PM

## 2019-10-20 NOTE — ED Triage Notes (Addendum)
Pt in from home via [facility] with decreased appetite, weight loss of 40 lb x 3 wks, and overall FTT - sent by family. Pt has PEG tube that is currently not being used for tube feeds and hx of abd surgery on 11/10. Pt was sent to rehab and left AMA a few wks ago. Left to come home with family, and family concerned since she is only taking in 2 tsp of soup and 1 cracker/day. Denies any abdominal pain, is having some nausea

## 2019-10-21 ENCOUNTER — Other Ambulatory Visit: Payer: Self-pay

## 2019-10-21 LAB — SARS CORONAVIRUS 2 (TAT 6-24 HRS): SARS Coronavirus 2: NEGATIVE

## 2019-10-21 NOTE — Care Management (Signed)
ED CM noted consult for placement. This patient was recently brought here to the ED by family seeking placement. TOC team unable to find placement patient was discharge home with [facility] support to continue the process.  CM has notified CSW who will continue to follow up.   Laurena Slimmer RN, BSN  ED Care Manager [phone]

## 2019-10-21 NOTE — ED Notes (Signed)
Pt asking for Ensure to be given via g-tube. Pt ate few bites for lunch.

## 2019-10-21 NOTE — ED Notes (Addendum)
Pt ate apple sauce, few bites of [place] sandwich, graham cracker, and drank 2 cups of water thus far today. Pt assisted w/moving to bed from stretcher for comfort. Pt voices she feels much better.

## 2019-10-21 NOTE — ED Notes (Signed)
Patient continues to yell out for staff instead of using call light for assistance; Patient ask to be pulled up to top of bed however patient was noted to already be at top of bed; Pt begin calling Staff "sons of B**ches" so RN left room to give patient a moment; RN returned and patient was still making disrespectful comments towards staff; RN advised patient that she can not speak to staff in that manner; pt yelled at RN and asked RN "get the hell out!" and states she will report this in the morning; Patient states she can not wait to leave in the morning; Patient continues to wait for SNF acceptance at this time and there is nothing confirming when patient will be leaving at this time; RN disengaged from patient; Call light left in reach-[name],RN

## 2019-10-21 NOTE — ED Notes (Signed)
Pt given phone as requested so she may call her brother.

## 2019-10-21 NOTE — Progress Notes (Signed)
CSW reviewed chart, patient has only one bed offer from [facility] at this time. CSW spoke with Juliann Pulse at [facility] who states she will determine bed availability and see if this patient can return, as she has left other facilities AMA recently.  CSW to continue following for discharge planning.  Madilyn Fireman, MSW, LCSW-A Transitions of Care  Clinical Social Worker  [facility] Emergency Departments  Medical ICU [phone]

## 2019-10-21 NOTE — ED Notes (Signed)
Patient has been sound asleep since shift change; This RN walked past door and patient yells "I want my DAMN medicines"; RN asked patient not to curse at her and advise patient of time and that med pass will start shortly; Pt continue to be verbally abusive so RN advised patient she will give her a moment and will come back once she is able to respect staff-[name],RN

## 2019-10-22 NOTE — ED Notes (Signed)
Pt requested to wait until tomorrow for Ensure to be given d/t fear of n/v/d this evening if given.

## 2019-10-22 NOTE — ED Notes (Signed)
SW/CM - SNF  Breakfast ordered

## 2019-10-22 NOTE — ED Notes (Signed)
Lying on bed awake. Breakfast tray delivered. Pt advised she does not want it.

## 2019-10-23 LAB — CBG MONITORING, ED
Glucose-Capillary: 114 mg/dL — ABNORMAL HIGH (ref 70–99)
Glucose-Capillary: 117 mg/dL — ABNORMAL HIGH (ref 70–99)

## 2019-10-23 MED ORDER — VITAL HIGH PROTEIN PO LIQD
1000.0000 mL | ORAL | Status: DC
  Administered 2019-10-23 – 2019-11-05 (×13): 1000 mL
  Filled 2019-10-23 (×9): qty 1000

## 2019-10-23 NOTE — ED Notes (Signed)
Pt does request to be DNR.

## 2019-10-23 NOTE — Progress Notes (Addendum)
1pm: CSW received notification that patient had been offered beds by [facility] and [facility].  CSW spoke with Gerald Stabs at [facility] who states both facilities do not have beds today.   10am: CSW spoke with Chantell at [facility] who reports the facility is still no longer able to take new admissions at this time.  Gerald Stabs at [facility] agreed to review patient.  9am: CSW met with patient at bedside to discuss discharge plan for SNF. Patient reports she left [name] AMA because there are no bed rails on the beds there and she is unable to turn herself over without the assistance of the rails. Patient reports her daughter died in November 02, 2019, who she lived with. Patient reports her brother Olga Millers is her POA and that CSW can contact him whenever.  Patient states understanding of need for SNF placement. CSW explained to patient that due to her leaving AMA from two facilities in the past, it is becoming more difficult to find her a new place.  CSW spoke with Ebony Hail of [facility] who states the facility cannot offer a bed to this patient.  Madilyn Fireman, MSW, LCSW-A Transitions of Care  Clinical Social Worker  [facility] Emergency Departments  Medical ICU [phone]

## 2019-10-23 NOTE — ED Notes (Signed)
RN gave pt zofran and explained that it will help her feel better due to bouts of nausea that are consistent with her disease of stomach volvulus. Rn discussed with pt why she has feeding tube. She reports she has not heard this before and has never been told she has a volvulus of stomach. She reports this explains to her why she has had violent episodes of vomiting and diarrhea. She states she wants to live and wants to be able to get feedings. She states her daughter just died in 10/30/2023 of alcoholic liver failure. She became tearful. She is estranged from her son. RN provided supportive listening.

## 2019-10-23 NOTE — ED Notes (Signed)
RN discussed with Dr. Regenia Skeeter about previous conversation about feedings.

## 2019-10-23 NOTE — ED Notes (Signed)
RN called portable materials for feeding pump and notified pharmacy for tube feedings.

## 2019-10-23 NOTE — ED Notes (Signed)
Breakfast ordered 

## 2019-10-24 LAB — CBG MONITORING, ED
Glucose-Capillary: 114 mg/dL — ABNORMAL HIGH (ref 70–99)
Glucose-Capillary: 114 mg/dL — ABNORMAL HIGH (ref 70–99)
Glucose-Capillary: 112 mg/dL — ABNORMAL HIGH (ref 70–99)
Glucose-Capillary: 104 mg/dL — ABNORMAL HIGH (ref 70–99)

## 2019-10-24 NOTE — ED Notes (Signed)
Pt asking for med for vaginal area - c/o irritation.

## 2019-10-24 NOTE — ED Notes (Signed)
Tech gave pt a sponge bath per pt request and performed perineal care. Tech applied cream to perineum area for breakdown

## 2019-10-24 NOTE — Progress Notes (Signed)
Physical Therapy Treatment Patient Details Name: Alyssa Odonnell MRN: [id number] DOB: [birth year] Today's Date: 10/24/2019    History of Present Illness 86 yo female admitted to ED on 1/1 from home with FTT, 40 lb weight loss in the past 3 weeks per report. Pt with recent SNF stay, apparently left AMA. PMH - HTN, PAF,glaucoma, CVA, gastric outlet obstruction with G tube placed.    PT Comments    Pt with improved tolerance for mobility today, completing short distance ambulation at EOB and repeated sit to stands for LE strengthening. Pt with improved affect today as well, as pt states she feels she is starting to get some strength back from tube feeds. PT to continue to progress mobility as tolerated, will continue to follow acutely.    Follow Up Recommendations  SNF     Equipment Recommendations  None recommended by PT    Recommendations for Other Services       Precautions / Restrictions Precautions Precautions: Fall Precaution Comments: peg Restrictions Weight Bearing Restrictions: No    Mobility  Bed Mobility Overal bed mobility: Needs Assistance Bed Mobility: Supine to Sit;Sit to Supine     Supine to sit: Mod assist;HOB elevated Sit to supine: Mod assist;HOB elevated   General bed mobility comments: Mod assist for trunk and LE management, scooting to EOB. Increased time and effort.  Transfers Overall transfer level: Needs assistance Equipment used: Rolling walker (2 wheeled) Transfers: Sit to/from Stand Sit to Stand: Mod assist         General transfer comment: Mod assist for power up, steadying. Verbal cuing for pushing through UEs on EOB. Sit to stand x4 for LE strengthening  Ambulation/Gait Ambulation/Gait assistance: Min assist Gait Distance (Feet): 8 Feet Assistive device: Rolling walker (2 wheeled) Gait Pattern/deviations: Step-through pattern;Decreased stride length;Shuffle;Trunk flexed Gait velocity: Decreased   General Gait Details: Min assist for  steadying; pt walked ~2 ft forward and 2 ft back with each stand.   Stairs             Wheelchair Mobility    Modified Rankin (Stroke Patients Only)       Balance Overall balance assessment: Needs assistance Sitting-balance support: Feet unsupported;Bilateral upper extremity supported Sitting balance-Leahy Scale: Fair     Standing balance support: Bilateral upper extremity supported;During functional activity Standing balance-Leahy Scale: Poor Standing balance comment: reliant on external support                            Cognition Arousal/Alertness: Awake/alert Behavior During Therapy: WFL for tasks assessed/performed Overall Cognitive Status: Within Functional Limits for tasks assessed                                 General Comments: Pt again tearful about daughter, states "I made myself a DNR, because what do I have to go home to?"      Exercises General Exercises - Lower Extremity Long Arc Quad: AROM;Both;10 reps;Seated    General Comments        Pertinent Vitals/Pain Pain Assessment: Faces Faces Pain Scale: Hurts little more Pain Location: hips, back; "I know it's from laying so much" Pain Descriptors / Indicators: Discomfort;Aching Pain Intervention(s): Limited activity within patient's tolerance;Monitored during session;Repositioned    Home Living                      Prior Function  PT Goals (current goals can now be found in the care plan section) Acute Rehab PT Goals Patient Stated Goal: get rehab, get fed PT Goal Formulation: With patient Time For Goal Achievement: 11/03/19 Potential to Achieve Goals: Good Progress towards PT goals: Progressing toward goals    Frequency    Min 2X/week      PT Plan      Co-evaluation              AM-PAC PT "6 Clicks" Mobility   Outcome Measure  Help needed turning from your back to your side while in a flat bed without using bedrails?: A  Lot Help needed moving from lying on your back to sitting on the side of a flat bed without using bedrails?: A Lot Help needed moving to and from a bed to a chair (including a wheelchair)?: A Lot Help needed standing up from a chair using your arms (e.g., wheelchair or bedside chair)?: A Lot Help needed to walk in hospital room?: A Lot Help needed climbing 3-5 steps with a railing? : Total 6 Click Score: 11    End of Session Equipment Utilized During Treatment: Gait belt Activity Tolerance: Patient limited by fatigue;Patient tolerated treatment well Patient left: in bed;with call bell/phone within reach;Other (comment)(with sandwich, RN notified and agreed with giving pt sandwich) Nurse Communication: Mobility status PT Visit Diagnosis: Other abnormalities of gait and mobility (R26.89);Muscle weakness (generalized) (M62.81)     Time: [id number] PT Time Calculation (min) (ACUTE ONLY): 30 min  Charges:  $Therapeutic Activity: 23-37 mins                     Kimon Loewen E, PT Acute Rehabilitation Services Pager [phone]  Office [phone]    Chelsea Pedretti D Elonda Husky 10/24/2019, 5:44 PM

## 2019-10-24 NOTE — ED Notes (Signed)
SW/CM-SNF Breakfast ordered

## 2019-10-24 NOTE — ED Notes (Signed)
PT in w/pt.  

## 2019-10-24 NOTE — ED Notes (Signed)
Feeding tube infusing w/o difficulty.

## 2019-10-25 DIAGNOSIS — Z515 Encounter for palliative care: Secondary | ICD-10-CM

## 2019-10-25 LAB — CBG MONITORING, ED: Glucose-Capillary: 103 mg/dL — ABNORMAL HIGH (ref 70–99)

## 2019-10-25 NOTE — ED Notes (Signed)
Patient is resting comfortably. 

## 2019-10-25 NOTE — Progress Notes (Signed)
During LOS meeting suggestion was made to link PT with [employer] to begin process for Medicaid application.

## 2019-10-25 NOTE — ED Notes (Signed)
Pt had large amount of soft stool using bedside commode. Purwick changed.

## 2019-10-25 NOTE — Progress Notes (Addendum)
1pm: CSW sent clinical information to additional facilities for review.  10am: CSW spoke with Gerald Stabs at [facility], he  states there are no beds available today.  CSW spoke with Juliann Pulse at [facility] to request an additional review, [facility] will not offer a bed to patient as she has left AMA from there in the past.  8am: CSW spoke with Gerald Stabs at [facility] who stated bed availability has not been determined at this time, he will return call to [place] with information ASAP.  Madilyn Fireman, MSW, LCSW-A Transitions of Care  Clinical Social Worker  [facility] Emergency Departments  Medical ICU [phone]

## 2019-10-25 NOTE — ED Notes (Signed)
RN called for more tube feedings.

## 2019-10-25 NOTE — ED Notes (Signed)
PT dry heaving and reporting she feels very sick this morning. She reports she feels like she isnt going to make it through the day

## 2019-10-25 NOTE — Consult Note (Signed)
Please see complete palliative consultation done on 10/02/19 by Wadie Lessen NP.  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 Failure to thrive and unable to live independently-she has not remained in nursing facilities and left because of issues with staff and quality of care being given. She is ambulatory on a walker but essentially has no support system to remain at home.   A community based palliative consult was placed at the time of her last discharge on 10/04/19 but they were unable to connect with the patient at the facility.  Patient has active MOST form under ACP documents in Epic ACP link (see ACP in storyboard on left of the screen). She is DNR.  Continue to recommend palliative care to follow at SNF, they should be a valuable resource to help advocate for her needs and concerns. Recommend TOC reach out to [employer] and notify them of her admission and request the connect with her in the post-acute facility.  In terms of her symptoms, recommend continued PRN nausea medication, can consider using low dose reglan for gastic related motility NV- watch for side effects given that she is elderly and prone to delirium.  Patient awaiting placement. Could consider ALF level is she is ambulatory and they can do PRN feeding through her PEG tube for supplemental nutrition.  Please call us with any additional needs or questions. Otherwise will follow as needed while she is awaiting placement.  Lane Hacker, DO Palliative Medicine [phone]  Time:30 minutes Greater than 50%  of this time was spent counseling and coordinating care related to the above assessment and plan.

## 2019-10-26 LAB — CBG MONITORING, ED
Glucose-Capillary: 107 mg/dL — ABNORMAL HIGH (ref 70–99)
Glucose-Capillary: 109 mg/dL — ABNORMAL HIGH (ref 70–99)
Glucose-Capillary: 125 mg/dL — ABNORMAL HIGH (ref 70–99)

## 2019-10-26 MED ORDER — LORAZEPAM 0.5 MG PO TABS
0.5000 mg | ORAL_TABLET | Freq: Four times a day (QID) | ORAL | Status: DC | PRN
  Administered 2019-10-27 – 2019-10-28 (×4): 0.5 mg via ORAL
  Filled 2019-10-26 (×4): qty 1

## 2019-10-26 NOTE — ED Notes (Signed)
Diet was ordered for Lunch. 

## 2019-10-26 NOTE — ED Notes (Signed)
Alyssa Odonnell, SW, and Odell, CM, assisting w/finding SNF placement. Pt states she has a son, Alyssa Odonnell, but she does not want him to be contacted.

## 2019-10-26 NOTE — ED Notes (Signed)
Breakfast tray ordered 

## 2019-10-26 NOTE — ED Notes (Signed)
Heard yelling from in pts room. Upon entering pts room, pt yelling at this RN "you haven't done a damn thing for me all night" "What the hell have you done?" "You left me in here and didn't give me my night time medications". Reoriented the pt to the time and that she has already had her night time medications. Pt still continues to curse and yell at this RN and tell me to clean her room up and "fluff her pillows".

## 2019-10-26 NOTE — Progress Notes (Signed)
CSW was advised by [facility] leadership to contact financial counseling to request their assistance with this patient's application for Medicaid.  CSW spoke with Bud, patient's brother and POA who agreed to start Medicaid application. Bud expressed extreme dissatisfaction with the patient's experience at [facility] in the past. CSW advised Bud that no further attempts will be made to place the patient there.  Madilyn Fireman, MSW, LCSW-A Transitions of Care  Clinical Social Worker  [facility] Emergency Departments  Medical ICU [phone]

## 2019-10-27 ENCOUNTER — Encounter (HOSPITAL_COMMUNITY): Payer: Medicare Other

## 2019-10-27 ENCOUNTER — Ambulatory Visit: Payer: Medicare Other

## 2019-10-27 LAB — CBG MONITORING, ED
Glucose-Capillary: 113 mg/dL — ABNORMAL HIGH (ref 70–99)
Glucose-Capillary: 121 mg/dL — ABNORMAL HIGH (ref 70–99)
Glucose-Capillary: 108 mg/dL — ABNORMAL HIGH (ref 70–99)

## 2019-10-27 MED ORDER — LORAZEPAM 0.5 MG PO TABS
0.5000 mg | ORAL_TABLET | Freq: Four times a day (QID) | ORAL | Status: DC | PRN
  Administered 2019-10-29 – 2019-11-05 (×8): 0.5 mg via ORAL
  Filled 2019-10-27 (×8): qty 1

## 2019-10-27 NOTE — ED Notes (Signed)
Diet was ordered for Lunch. 

## 2019-10-27 NOTE — ED Notes (Addendum)
Pt's briefs slightly digging into the pt on the right inner thigh. Slight redness noticed. Briefs were adjusted, will recheck soon.

## 2019-10-27 NOTE — ED Notes (Signed)
Patient CBG was 121.

## 2019-10-27 NOTE — ED Notes (Signed)
RN talked with Dr. Rogene Houston about pt's irritability and seeing about getting meds that may help her mood. Pt states she will not eat her meal tray since she is vegetarian. RN explained that she is getting feedings but happy to order her vegetarian tray if she desires.

## 2019-10-27 NOTE — Progress Notes (Signed)
CSW faxed out patient's clinical information to additional facilities to request their review.  Madilyn Fireman, MSW, LCSW-A Transitions of Care  Clinical Social Worker  [facility] Emergency Departments  Medical ICU [phone]

## 2019-10-27 NOTE — ED Notes (Signed)
SW/CM-SNF Breakfast ordered

## 2019-10-27 NOTE — ED Notes (Signed)
CBG results of 108 reported to Phill, RN.

## 2019-10-27 NOTE — ED Notes (Signed)
Pt is very irritable this morning and refusing care. This seems to be change from former self.

## 2019-10-27 NOTE — Progress Notes (Signed)
PT Cancellation Note  Patient Details Name: Alyssa Odonnell MRN: [id number] DOB: 1934/07/21   Cancelled Treatment:    Reason Eval/Treat Not Completed: Patient declined, no reason specified - pt states she is not in the mood for therapy and wants PT to leave her alone. PT to check back as schedule allows.  [place] Pager [phone]  Office [phone]    Roxine Caddy D Elonda Husky 10/27/2019, 11:08 AM

## 2019-10-28 LAB — BASIC METABOLIC PANEL
Anion gap: 11 (ref 5–15)
BUN: 22 mg/dL (ref 8–23)
CO2: 27 mmol/L (ref 22–32)
Calcium: 9.2 mg/dL (ref 8.9–10.3)
Chloride: 100 mmol/L (ref 98–111)
Creatinine, Ser: 1.06 mg/dL — ABNORMAL HIGH (ref 0.44–1.00)
GFR calc Af Amer: 56 mL/min — ABNORMAL LOW (ref >60)
GFR calc non Af Amer: 49 mL/min — ABNORMAL LOW (ref >60)
Glucose, Bld: 112 mg/dL — ABNORMAL HIGH (ref 70–99)
Potassium: 3.6 mmol/L (ref 3.5–5.1)
Sodium: 138 mmol/L (ref 135–145)

## 2019-10-28 LAB — CBC
HCT: 38.8 % (ref 36.0–46.0)
Hemoglobin: 11.8 g/dL — ABNORMAL LOW (ref 12.0–15.0)
MCH: 27.4 pg (ref 26.0–34.0)
MCHC: 30.4 g/dL (ref 30.0–36.0)
MCV: 90 fL (ref 80.0–100.0)
Platelets: 277 10*3/uL (ref 150–400)
RBC: 4.31 MIL/uL (ref 3.87–5.11)
RDW: 14.7 % (ref 11.5–15.5)
WBC: 6.3 10*3/uL (ref 4.0–10.5)
nRBC: 0 % (ref 0.0–0.2)

## 2019-10-28 LAB — CBG MONITORING, ED
Glucose-Capillary: 106 mg/dL — ABNORMAL HIGH (ref 70–99)
Glucose-Capillary: 102 mg/dL — ABNORMAL HIGH (ref 70–99)

## 2019-10-28 NOTE — ED Notes (Signed)
Pt states she is happy because she feels her appetite is improving. Pt ate tomato soup and salad at lunch and is now eating crackers w/cheese x 3.

## 2019-10-28 NOTE — ED Notes (Addendum)
Pt's feeding tube lines replaced.

## 2019-10-28 NOTE — ED Notes (Signed)
Pt advised of lower back spasms and discomfort, pt having difficulty sleeping. Pt repositioned, provided with extra pillow for support, and hot pack x2 applied for pain relief. Pt advised heat helped some. Will reevaluate.

## 2019-10-28 NOTE — ED Notes (Addendum)
Pt noted to be irritable. Door to room closed per pt request as pt stated "I don't want to hear that shit going on out there". Blinds open and call bell in reach for pt.

## 2019-10-28 NOTE — ED Notes (Signed)
Tomato soup ordered for pt as requested.

## 2019-10-28 NOTE — ED Notes (Signed)
Pt complaining of not being able to sleep and having a restless leg. Ativan given.

## 2019-10-28 NOTE — ED Notes (Signed)
Pt noted to be talking to [employer].

## 2019-10-28 NOTE — ED Notes (Signed)
SW/CM-SNF Breakfast ordered

## 2019-10-28 NOTE — ED Notes (Signed)
Pt is resting comfortably at this time.

## 2019-10-28 NOTE — ED Notes (Signed)
Pt assisted to recliner for comfort as requested d/t c/o back spasms. Breakfast tray delivered to pt. Tube feedings continuing.

## 2019-10-28 NOTE — ED Notes (Signed)
Pharmacy called about sending Eliquis.

## 2019-10-28 NOTE — ED Notes (Signed)
Dr Billy Fischer aware when RN was attempting to remove feeding tube tubing from feeding tube, the side of the opening tore. No leaking/drainage noted - advised to continue to monitor.

## 2019-10-29 MED ORDER — CYCLOBENZAPRINE HCL 10 MG PO TABS
10.0000 mg | ORAL_TABLET | Freq: Three times a day (TID) | ORAL | Status: DC | PRN
  Administered 2019-10-29 – 2019-11-05 (×9): 10 mg via ORAL
  Filled 2019-10-29 (×10): qty 1

## 2019-10-29 MED ORDER — ACETAMINOPHEN 325 MG PO TABS
650.0000 mg | ORAL_TABLET | Freq: Four times a day (QID) | ORAL | Status: DC | PRN
  Administered 2019-10-29: 650 mg via ORAL
  Filled 2019-10-29 (×3): qty 2

## 2019-10-29 NOTE — ED Notes (Signed)
Pt assisted to bedside commode

## 2019-10-29 NOTE — ED Notes (Addendum)
Pt noted to be irritable - cursing. Stating she is upset w/breakfast tray d/t food was cold and she does not eat bacon. Pt refused to eat oatmeal - demanding for tomato soup to be ordered for breakfast. Advised pt will order soup for lunch. Pt voiced understanding after much discussion. States she is upset d/t being in hospital so long. States she wants to be able to go home then voices she understands she is unable to care for herself. States PT has not come to work w/her. Advised her on PT's last visit, she refused to participate - States "I know. That's because I didn't want to". Encouraged pt to work w/PT when return - voiced understanding. Tube feeding continues w/o difficulty - flushed w/water prior to and after meds given. Pt also c/o back spasms. States she refused Tylenol offered last pm d/t "Tylenol has not ever helped me!" Assisted pt w/repositioning.

## 2019-10-29 NOTE — ED Notes (Signed)
Ordered breakfast--Alyssa Odonnell 

## 2019-10-29 NOTE — ED Notes (Addendum)
Pt unable to have BM. Pt assisted back to bed. Peri Care performed - Pure Wick changed. Tomato soup and salad ordered for lunch for pt. Diet changed to vegetarian as pt states she does not eat meat. Pt continues to c/o back spasms and weakness w/lower extremities.

## 2019-10-29 NOTE — ED Notes (Signed)
Sitting up in chair after being bathed.

## 2019-10-29 NOTE — ED Notes (Signed)
Pt is complaining of back pain. MD put in orders for tylenol, however pt, refused.

## 2019-10-29 NOTE — ED Notes (Addendum)
Pt noted to be sleepy but c/o headache - ice pack applied and heat packs x 2 applied to lower back as requested for comfort d/t c/o pain.

## 2019-10-29 NOTE — ED Notes (Signed)
Pt upset about the breakfast (bacon and eggs). Pt using profanity. Pt wanted tomato soup. Pt offered chicken or beef broth, but opted to eat the breakfast food provided. Pt informed that she can have tomato soup for lunch.

## 2019-10-30 NOTE — ED Notes (Signed)
Tomato Soup, and Salad was ordered for Lunch.

## 2019-10-30 NOTE — ED Notes (Signed)
Removed pt from bed pan, only a little BM show. Tech cleaned perineal area as well as bottom free of BM show. Placed on new chux, put purewick back in place. Dimmed lights to allow pt to rest

## 2019-10-30 NOTE — ED Notes (Signed)
Patient was assisted to bedside Toilet to have a BM, But when it was time for her to get off toilet, Pt. Was very weak when tried to get off toilet to get back on to bed. It had taken 2 assist to help patient to stand, because she was having trouble standing, she was very weak. The Nurse was informed.

## 2019-10-30 NOTE — ED Notes (Signed)
PT's feeding tube site reddened. RN cleaned and PA assessed. Skin prep used, bacitracin applied and gauze. RN will continue to monitor. No obvious signs of redness at this time.

## 2019-10-30 NOTE — ED Notes (Signed)
Bfast ordered  

## 2019-10-30 NOTE — ED Notes (Signed)
Patient was given [employer] and Cookies.

## 2019-10-31 NOTE — ED Notes (Signed)
Patient has asked staff several times in the last 30 minutes to adjust her in bed; pt is throwing her self in bed and attempting to get up; RN redirected patient back in bed as she is a high fall risk; Pt is cursing at staff and belittling staff; RN had a discussion with patient in regards to respecting staff; Pt asked RN to leave her a lone; RN closed the door to allow patient to have her moment-[name],RN

## 2019-10-31 NOTE — ED Notes (Signed)
Pt. Sheets change and new purewick placed. Pt. Is sitting up in bed eating at this time

## 2019-10-31 NOTE — ED Notes (Signed)
Dinner tray on bedside table - states she does not feel like eating as of yet.

## 2019-10-31 NOTE — ED Notes (Signed)
PT w/pt.  

## 2019-10-31 NOTE — Progress Notes (Addendum)
CSW faxed patient's clinical information to several additional facilities yesterday for review. Patient has not been accepted into any facility at this time.  CSW sent clinicals for review to additional facilities.  CSW to continue searching for placement.  Madilyn Fireman, MSW, LCSW-A Transitions of Care  Clinical Social Worker  [facility] Emergency Departments  Medical ICU [phone]

## 2019-10-31 NOTE — ED Notes (Addendum)
Pt assisted to bedside commode then back to bed. Pt stated she is hoping PT will come work w/her today.

## 2019-10-31 NOTE — ED Notes (Signed)
Pt c/o nausea and restless legs - Zofran and Ativan given. Pt states she was unable to eat lunch provided earlier - ate few bites. Continuous tube feeding infusing. PureWick intact to pt.

## 2019-10-31 NOTE — ED Notes (Signed)
Breakfast Ordered 

## 2019-10-31 NOTE — ED Notes (Signed)
Pt eating salad, crackers, grilled cheese, and tomato soup for lunch.

## 2019-10-31 NOTE — Progress Notes (Signed)
Physical Therapy Treatment Patient Details Name: Alyssa Odonnell MRN: [id number] DOB: Feb 13, 1934 Today's Date: 10/31/2019    History of Present Illness 86 yo female admitted to ED on 1/1 from home with FTT, 40 lb weight loss in the past 3 weeks per report. Pt with recent SNF stay, apparently left AMA. PMH - HTN, PAF,glaucoma, CVA, gastric outlet obstruction with G tube placed.    PT Comments    Patient progressing with ambulation distance, though extremely weak and fatigues quickly.  She has joint crepitus in the hips and low back with transitions and R knee genu valgus collapse almost.  She will benefit from continues skilled PT in the acute setting and follow up SNF level rehab at d/c.   Follow Up Recommendations  SNF     Equipment Recommendations  None recommended by PT    Recommendations for Other Services       Precautions / Restrictions Precautions Precautions: Fall Precaution Comments: peg    Mobility  Bed Mobility Overal bed mobility: Needs Assistance Bed Mobility: Supine to Sit;Sit to Supine     Supine to sit: Min assist Sit to supine: Mod assist   General bed mobility comments: assist to lift trunk to sit, assist for legs into bed to supine  Transfers Overall transfer level: Needs assistance Equipment used: Rolling walker (2 wheeled) Transfers: Sit to/from Stand Sit to Stand: Max assist         General transfer comment: lifting help from EOB and lowering help to EOB due to significant joint collapse on R knee and bilat hip/lumbar crepitus with transitions.  Ambulation/Gait Ambulation/Gait assistance: Min assist;Mod assist Gait Distance (Feet): 35 Feet Assistive device: Rolling walker (2 wheeled) Gait Pattern/deviations: Step-to pattern;Step-through pattern;Antalgic;Trunk flexed;Decreased stride length;Shuffle     General Gait Details: very weak and shaky with at times increased assist and cues for trunk to elevate and pt reports UE fatigue, side step  to [facility] after return to sit on foot of bed   Stairs             Wheelchair Mobility    Modified Rankin (Stroke Patients Only)       Balance Overall balance assessment: Needs assistance Sitting-balance support: Feet supported Sitting balance-Leahy Scale: Fair Sitting balance - Comments: does fatigue with trunk collapse after ambulation   Standing balance support: Bilateral upper extremity supported Standing balance-Leahy Scale: Poor Standing balance comment: reliant on external support                            Cognition Arousal/Alertness: Awake/alert Behavior During Therapy: Agitated Overall Cognitive Status: No family/caregiver present to determine baseline cognitive functioning Area of Impairment: Memory;Problem solving                     Memory: Decreased short-term memory       Problem Solving: Decreased initiation;Requires verbal cues;Requires tactile cues General Comments: frustrated by my "late" arrival at 2:30pm and refused initially, agreed with RN support and more amenable      Exercises      General Comments        Pertinent Vitals/Pain Pain Assessment: Faces Faces Pain Scale: Hurts whole lot Pain Location: back, R knee, hips Pain Descriptors / Indicators: Aching Pain Intervention(s): Monitored during session;Repositioned;Limited activity within patient's tolerance    Home Living                      Prior  Function            PT Goals (current goals can now be found in the care plan section) Progress towards PT goals: Progressing toward goals    Frequency    Min 2X/week      PT Plan Current plan remains appropriate    Co-evaluation              AM-PAC PT "6 Clicks" Mobility   Outcome Measure  Help needed turning from your back to your side while in a flat bed without using bedrails?: A Lot Help needed moving from lying on your back to sitting on the side of a flat bed without using  bedrails?: A Lot Help needed moving to and from a bed to a chair (including a wheelchair)?: A Lot Help needed standing up from a chair using your arms (e.g., wheelchair or bedside chair)?: A Lot Help needed to walk in hospital room?: A Lot Help needed climbing 3-5 steps with a railing? : Total 6 Click Score: 11    End of Session   Activity Tolerance: Patient limited by fatigue;Patient limited by pain Patient left: in bed;with call bell/phone within reach   PT Visit Diagnosis: Other abnormalities of gait and mobility (R26.89);Muscle weakness (generalized) (M62.81) Pain - Right/Left: Right Pain - part of body: Knee     Time: [id number] PT Time Calculation (min) (ACUTE ONLY): 32 min  Charges:  $Gait Training: 8-22 mins $Therapeutic Activity: 8-22 mins                     Magda Kiel, [place] [facility] [phone] 10/31/2019    Reginia Naas 10/31/2019, 5:19 PM

## 2019-11-01 NOTE — ED Notes (Addendum)
Barrier cream applied to inside right thigh per pt request. Pure wick placement checked. Draining well. Diaper changed per pt request. Diaper was dry. New diaper placed on pt. Bed linens "fixed" for pt. Pt pulled up in bed. Pt complaining the entire time about how this hospital is horrible, and how this RN is horrible, and how no one has done anything for her since she has been here. Blankets replaced with new warm ones. Will continue to monitor.

## 2019-11-01 NOTE — ED Notes (Signed)
The pt is c/o about everything reporting that this is the worse hospital  Nothing is right about her being here  shes nice for a little while the starts c/o something that happened this am

## 2019-11-01 NOTE — ED Notes (Signed)
Feeding tube site cleaned and fresh dressing applied.

## 2019-11-01 NOTE — ED Notes (Signed)
Patient is resting comfortably. 

## 2019-11-01 NOTE — ED Notes (Signed)
Pt c/o her dinner being too cold  I reminded her that it had been sitting there for over 30 minutes  She complained that all her meals were cold in this place  Dinner warmed up

## 2019-11-01 NOTE — ED Notes (Signed)
Pt's sheet changed and fresh gown given.

## 2019-11-01 NOTE — Progress Notes (Addendum)
1:30pm: CSW received return call from [place] at [facility] who reports she will be unable to offer a bed due to the patient leaving AMA from two facilities in the past.  10am: CSW spoke with Morey Hummingbird at [facility] who agreed to review this patient and return call to [place].  Madilyn Fireman, MSW, LCSW-A Transitions of Care  Clinical Social Worker  [facility] Emergency Departments  Medical ICU [phone]

## 2019-11-01 NOTE — ED Notes (Addendum)
Called to pts room to "clean her room". "Its a fucking mess in here" "Nobody ever cleans anything in here" "look at this shit". Pt continues to curse and mumble under her breath. Pt then continues to talk about Biden and Kenton Kingfisher while watching [employer], cursing the entire time. Asked pt to watch her language and that she did not need to be speaking that way here. "I'll speak any damn way I want to" "Who the hell are you to tell me what the fuck to do?" Told pt that there are other patients and this is a hospital and she needed to be a little more quiet, that's all. Pt stated  "Fuck them" "I don't give a shit about them". 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 Pt stated "Fuck you".  This RN told pt that I will not be spoken to like that and walked out of the room.

## 2019-11-01 NOTE — ED Notes (Signed)
Breakfast ordered 

## 2019-11-01 NOTE — ED Notes (Signed)
The pts trya has been at   The bedside for the past hour  I asked the pt  How was her dinner she reports that she has not gotten to it yet  As she sits writing on papers on a tablet

## 2019-11-01 NOTE — ED Notes (Signed)
Tube feeding continues  To infuse at 16ml.hr

## 2019-11-01 NOTE — ED Notes (Addendum)
Pt still cursing in her room. When asked what is wrong and if she needed anything, pt states "leave me the hell alone".

## 2019-11-02 NOTE — ED Notes (Signed)
PT at bedside.

## 2019-11-02 NOTE — Progress Notes (Signed)
Physical Therapy Treatment Patient Details Name: Alyssa Odonnell MRN: [id number] DOB: 22-Aug-1934 Today's Date: 11/02/2019    History of Present Illness 86 yo female admitted to ED on 1/1 from home with FTT, 40 lb weight loss in the past 3 weeks per report. Pt with recent SNF stay, apparently left AMA. PMH - HTN, PAF,glaucoma, CVA, gastric outlet obstruction with G tube placed.    PT Comments    Patient progressing with mobility and able to walk for two trials with chair following with increased safety.  Much more cooperative today and verbalized desire to work with PT more frequently.  Feel she will continue to benefit from skilled PT in the acute setting, but needs increased frequency/consistency available at SNF.   Goals renewed this session.   Follow Up Recommendations  SNF     Equipment Recommendations  None recommended by PT    Recommendations for Other Services       Precautions / Restrictions Precautions Precautions: Fall Precaution Comments: peg    Mobility  Bed Mobility Overal bed mobility: Needs Assistance Bed Mobility: Supine to Sit     Supine to sit: Min guard     General bed mobility comments: to initiate bringing R leg off bed  Transfers Overall transfer level: Needs assistance Equipment used: Rolling walker (2 wheeled) Transfers: Sit to/from [facility] Sit to Stand: Mod assist;+2 safety/equipment Stand pivot transfers: Min assist       General transfer comment: up from bed to [facility], then up/down from recliner; pivot with nursing facing 3:1 and stepping around assist for balance, safety with cues and time  Ambulation/Gait Ambulation/Gait assistance: Min assist;+2 safety/equipment;Mod assist Gait Distance (Feet): 20 Feet(& 25') Assistive device: Rolling walker (2 wheeled) Gait Pattern/deviations: Step-to pattern;Step-through pattern;Antalgic;Trunk flexed;Decreased stride length;Shuffle     General Gait Details: increased time, slow  and with audible joint crepitus, leaning with heavy support on RW   Stairs             Wheelchair Mobility    Modified Rankin (Stroke Patients Only)       Balance Overall balance assessment: Needs assistance Sitting-balance support: Feet supported Sitting balance-Leahy Scale: Fair Sitting balance - Comments: does fatigue with trunk collapse after ambulation   Standing balance support: Bilateral upper extremity supported Standing balance-Leahy Scale: Poor Standing balance comment: reliant on external support                            Cognition Arousal/Alertness: Awake/alert Behavior During Therapy: WFL for tasks assessed/performed Overall Cognitive Status: No family/caregiver present to determine baseline cognitive functioning Area of Impairment: Memory;Problem solving                     Memory: Decreased short-term memory       Problem Solving: Requires verbal cues;Slow processing;Requires tactile cues        Exercises      General Comments        Pertinent Vitals/Pain Faces Pain Scale: Hurts even more Pain Location: r knee, arms (fatigue) Pain Descriptors / Indicators: Aching Pain Intervention(s): Monitored during session;Repositioned;Limited activity within patient's tolerance    Home Living                      Prior Function            PT Goals (current goals can now be found in the care plan section) Acute Rehab PT Goals  Patient Stated Goal: to get stronger PT Goal Formulation: With patient Time For Goal Achievement: 11/16/19 Progress towards PT goals: Progressing toward goals    Frequency    Min 2X/week      PT Plan Current plan remains appropriate    Co-evaluation              AM-PAC PT "6 Clicks" Mobility   Outcome Measure  Help needed turning from your back to your side while in a flat bed without using bedrails?: A Little Help needed moving from lying on your back to sitting on the side  of a flat bed without using bedrails?: A Lot Help needed moving to and from a bed to a chair (including a wheelchair)?: A Lot Help needed standing up from a chair using your arms (e.g., wheelchair or bedside chair)?: A Lot Help needed to walk in hospital room?: A Lot Help needed climbing 3-5 steps with a railing? : Total 6 Click Score: 12    End of Session   Activity Tolerance: Patient limited by fatigue;Patient limited by pain Patient left: in chair;with call bell/phone within reach;with nursing/sitter in room   PT Visit Diagnosis: Other abnormalities of gait and mobility (R26.89);Muscle weakness (generalized) (M62.81);Pain Pain - Right/Left: Right Pain - part of body: Knee     Time: [id number] PT Time Calculation (min) (ACUTE ONLY): 29 min  Charges:  $Gait Training: 8-22 mins $Therapeutic Activity: 8-22 mins                     Magda Kiel, [place] [facility] [phone] 11/02/2019    Reginia Naas 11/02/2019, 12:18 PM

## 2019-11-02 NOTE — ED Notes (Signed)
Placed pt on bedpan per pt request.

## 2019-11-02 NOTE — ED Notes (Signed)
Called back to pts room. Pt now wants to sit back up on side of bed. Talking about walking around. Told pt that she cannot walk around the unit right now due to safety reasons/fall risks, also that it is the middle of the night and she has had no sleep and others are sleeping.  Pt aware that PT will be here to work with her.

## 2019-11-02 NOTE — ED Notes (Signed)
Pt taken off of bed pan. No BM. Only urine. Pt still complaining about everything. Diaper still dry, reapplied and pure wick put back in place. Bed straightened up and pt covered up with warm blankets.

## 2019-11-02 NOTE — ED Notes (Signed)
SW/CM-SNF Breakfast odered

## 2019-11-02 NOTE — ED Notes (Addendum)
Tube feeding continues at 8ml/hr.

## 2019-11-02 NOTE — ED Notes (Addendum)
Called to pts room. Pt cursing and trying to sit herself up. Helped pt to sit up on side of bed. States that she has not had her night time medications yet. Reoriented pt to time and that pt had all of her night meds at 2230. Pts applesauce that she took her pills with is still at bedside because she did not want it thrown out.  Pt denies being tired. Given fresh ice water.

## 2019-11-02 NOTE — ED Notes (Signed)
TV turned off per request of pt. Door pulled almost shut per pt request.

## 2019-11-02 NOTE — Progress Notes (Addendum)
9am: CSW received notification from [place] with [facility] that the patient owns property and therefore will not qualify for long term Medicaid.  8am: CSW received notification from financial counseling staff member that patient will be reviewed to apply for Medicaid.  Madilyn Fireman, MSW, LCSW-A Transitions of Care  Clinical Social Worker  [facility] Emergency Departments  Medical ICU [phone]

## 2019-11-02 NOTE — ED Notes (Signed)
Helped pt back to bed. Pt started cursing again. Warm blankets given to pt. Brief checked and it is dry. Pure wick working well. Pt ate animal crackers.

## 2019-11-02 NOTE — ED Notes (Signed)
Pt called in one of the NT that was walking by and when NT went into room, pt began to curse at her and call her names. NT left the room and told this RN. This RN went to pts room and told pt not to ask anyone for anything except this RN if she was going to be mean and ugly to everyone. Told the pt that she has the call bell, and she was to use it to call me and only me and that she is not going to abuse the staff here. Pt responded with "fuck you" "I don't need you bitch" "Get out of my room". "I want another nurse". Pt made aware that I'm the only RN shes got tonight.

## 2019-11-02 NOTE — ED Notes (Signed)
Pt placed on bedpan. States that she thinks she may have to have a BM. Pt cursing constantly about family, being here at the hospital, the staff and how "bad" she is treated. This RN just stood and listened.

## 2019-11-02 NOTE — ED Notes (Signed)
Assisted pt onto the bedpan per pt request.

## 2019-11-02 NOTE — ED Notes (Signed)
Lunch Tray Ordered @ 1822.  

## 2019-11-02 NOTE — ED Notes (Signed)
Lunch tray ordered 

## 2019-11-02 NOTE — ED Notes (Addendum)
Pt took her night time medications with applesauce without issue. Pt does not need to have meds crushed and put in tube. Pt resting comfortably.

## 2019-11-02 NOTE — ED Notes (Signed)
Pt verbally aggressive with this nurse, argumentative, cussing, Encouragement and support provided. Will continue to monitor.

## 2019-11-02 NOTE — ED Notes (Signed)
Pt two person assist to bedside commode, pt had BM

## 2019-11-03 NOTE — ED Notes (Signed)
SW/CM-SNF Breakfast odered

## 2019-11-03 NOTE — ED Notes (Signed)
Giving pt breakfast tray when Pt became verbally aggressive and cursing at staff. Pt states her room smells like "shit" and when asked not to curse at staff pt refused and continued. Charge RN aware.

## 2019-11-03 NOTE — Progress Notes (Addendum)
CSW spoke with Gerald Stabs at [facility] who reports [place] [facility] [place] are not accepting new patients at this time.  CSW spoke with [place] at [facility] in [place] who states the facility is not accepting new patients at this time.  CSW attempted to reach staff at [facility] - however no answer and the voicemail box is full.  Madilyn Fireman, MSW, LCSW-A Transitions of Care  Clinical Social Worker  [facility] Emergency Departments  Medical ICU [phone]

## 2019-11-04 LAB — CBC
HCT: 38.7 % (ref 36.0–46.0)
Hemoglobin: 11.9 g/dL — ABNORMAL LOW (ref 12.0–15.0)
MCH: 27.1 pg (ref 26.0–34.0)
MCHC: 30.7 g/dL (ref 30.0–36.0)
MCV: 88.2 fL (ref 80.0–100.0)
Platelets: 338 10*3/uL (ref 150–400)
RBC: 4.39 MIL/uL (ref 3.87–5.11)
RDW: 15.2 % (ref 11.5–15.5)
WBC: 10.1 10*3/uL (ref 4.0–10.5)
nRBC: 0 % (ref 0.0–0.2)

## 2019-11-04 LAB — BASIC METABOLIC PANEL
Anion gap: 12 (ref 5–15)
BUN: 20 mg/dL (ref 8–23)
CO2: 27 mmol/L (ref 22–32)
Calcium: 9.6 mg/dL (ref 8.9–10.3)
Chloride: 97 mmol/L — ABNORMAL LOW (ref 98–111)
Creatinine, Ser: 1.25 mg/dL — ABNORMAL HIGH (ref 0.44–1.00)
GFR calc Af Amer: 46 mL/min — ABNORMAL LOW (ref >60)
GFR calc non Af Amer: 40 mL/min — ABNORMAL LOW (ref >60)
Glucose, Bld: 117 mg/dL — ABNORMAL HIGH (ref 70–99)
Potassium: 3.8 mmol/L (ref 3.5–5.1)
Sodium: 136 mmol/L (ref 135–145)

## 2019-11-04 LAB — CBG MONITORING, ED: Glucose-Capillary: 98 mg/dL (ref 70–99)

## 2019-11-04 NOTE — ED Notes (Addendum)
Pt noted to be irritable this am - RN assisted in calming pt when Phlebotomist obtaining blood work. Pt c/o she has been moved to another room/area for the past 5 nights and feels she is not being able to rest well.

## 2019-11-04 NOTE — Progress Notes (Signed)
CSW sent referral for additional SNF providers in the region outside of [place], [place], [place]. CSW was informed [facility] in [place] has possible beds and re-sent referral to that facility.

## 2019-11-04 NOTE — ED Notes (Signed)
Water given and Lunch tray set up for pt - States "I might try to eat a little bit".

## 2019-11-04 NOTE — ED Notes (Signed)
Pt assisted to bedside commode as requested d/t feels needs to have BM.

## 2019-11-04 NOTE — ED Notes (Signed)
Tube feeding continues @ 71ml/hr.

## 2019-11-04 NOTE — ED Notes (Addendum)
Pt sitting up in lounge chair at this time watching TV. Ice water has been provided to the pt per request.

## 2019-11-04 NOTE — ED Notes (Signed)
Pt requesting vegetarian diet d/t states she does not like to eat meat.

## 2019-11-04 NOTE — ED Notes (Signed)
Lunch Tray Ordered @ 1020.  

## 2019-11-04 NOTE — ED Notes (Signed)
Pt refused feeding supplement, pt agreed to eat a cup of applesauce instead. Pt tolerated about half the package, discarded the rest.

## 2019-11-04 NOTE — ED Notes (Signed)
Lab draw not done to refusal and verbal abuse. Agitated at this time, provided some water to calm down.

## 2019-11-04 NOTE — ED Notes (Signed)
Pt moved to Rm 11 - Tube feeding infusing - Pure Wick intact. Dinner tray on bedside table. Pt has call bell in reach - her 2 belongings bags, eyeglasses, and hospital phone at bedside.

## 2019-11-04 NOTE — ED Notes (Addendum)
Dinner tray delivered - Pt on bedpan as requested.

## 2019-11-04 NOTE — ED Notes (Signed)
SW/CM-SNF Breakfast odered

## 2019-11-04 NOTE — ED Notes (Signed)
Assisted pt back to bed w/use of walker from recliner. Pure Wick attached. Pt states she wants to nap at this time. Call bell w/in reach.

## 2019-11-04 NOTE — ED Notes (Signed)
Pt concerned that blood sugar is low. CBG result 98. Gave pt saltines and graham crackers per her request.

## 2019-11-05 NOTE — ED Notes (Signed)
Pt urinated in bed. Sheets and pt cleaned. Pt refuses to use PureWick.

## 2019-11-05 NOTE — ED Notes (Signed)
Pt finished eating lunch about 3/4 of meal.

## 2019-11-05 NOTE — ED Notes (Signed)
Pt arrived to Rm 52 via bed. Pt noted to be sleepy. Pure Wick intact and tube feeding infusing @ 48ml/hr.

## 2019-11-05 NOTE — ED Notes (Signed)
Area around feeding tube cleaned and new dressing applied.

## 2019-11-05 NOTE — ED Notes (Signed)
Pt received lunch tray 

## 2019-11-05 NOTE — ED Notes (Signed)
Vegetarian lunch tray ordered for pt.

## 2019-11-05 NOTE — ED Notes (Addendum)
Pt is resting comfortably at this time. Pt's demeanor has remained consistent through shift, agreeable and friendly. Pt continues to verbalize frustration with her poor appetite.

## 2019-11-05 NOTE — Progress Notes (Signed)
Patient was accepted to [facility] and accepted offer. [facility] is aware of tube for feeding and is working with their dietician to prepare a plan. Per facility they anticipate being able to accept her tomorrow. No bed number or RN# provided yet. Weekday CSW has been left a handoff to [place] for facility. CSW will update as more information is provided.  Per patient request facility has been notified regarding patient's question of having bed rails.

## 2019-11-05 NOTE — ED Notes (Signed)
Pt has concerns about not being able to have tube feeding at next facility.

## 2019-11-05 NOTE — ED Notes (Signed)
Pt declined eating breakfast this am - Pt eating lunch. Pt apologized for her behavior this am.

## 2019-11-05 NOTE — ED Notes (Addendum)
Pt noted to be tearful talking about her recently deceased daughter. Encouraged pt to vent feelings as she was noted to be irritable initially and encouraged pt to be nice to staff - voiced understanding. Pt now calm, cooperative. Also, states she speaks w/her dtr's son, Keefner (who lives in [place]), everyday which helps her to feel better. Pure Wick placed as pt has now decided she wants it.

## 2019-11-05 NOTE — ED Notes (Signed)
Pt placed on bedpan as requested - no results. Pure Wick changed out and reapplied. Pt given newspaper.

## 2019-11-05 NOTE — ED Notes (Signed)
Pt voiced understanding and agreement to be accepted to [facility] facility w/Joe, SW. Aware will be transported tomorrow (11/06/19).

## 2019-11-06 LAB — RESPIRATORY PANEL BY RT PCR (FLU A&B, COVID)
Influenza A by PCR: NEGATIVE
Influenza B by PCR: NEGATIVE
SARS Coronavirus 2 by RT PCR: NEGATIVE

## 2019-11-06 NOTE — ED Notes (Signed)
Rn attempted report

## 2019-11-06 NOTE — Progress Notes (Addendum)
10:30am: Patient will go to room 706 at [facility] in [place]. The number to call for report is [phone], ask for 700 hall RN.   CSW notified patient's brother Hollice Espy of discharge information.  CSW arranged for transportation to [facility] via [facility].   7:30am: CSW spoke with representative at [facility] to initiate authorization for SNF placement. CSW faxed patient's clinical information over for review.  Madilyn Fireman, MSW, LCSW-A Transitions of Care  Clinical Social Worker  [facility] Emergency Departments  Medical ICU [phone]

## 2019-11-06 NOTE — ED Notes (Signed)
Reordered Lunch Tray @ 1212-per RN.

## 2019-11-06 NOTE — ED Notes (Addendum)
Attempted to place purewick back between patient's legs. Pt refusing, using profanity and yelling that she wants to be left alone.

## 2019-11-06 NOTE — ED Notes (Signed)
Canceled Tray @ 1042-up for Discharge.

## 2019-11-06 NOTE — ED Provider Notes (Signed)
The patient is stable here in the ER. She will be discharged to a facility. She is in no acute distress.    Dalia Heading, PA-C 11/06/19 1059    Fatima Blank, MD 11/06/19 2259

## 2019-11-06 NOTE — ED Notes (Signed)
Report given to Mickel Baas, facility nurse.

## 2019-11-08 NOTE — Progress Notes (Deleted)
History of Present Illness:  Patient is a 86 y.o. year old female who presents for evaluation of PAD.  She originally presented with acute onset of discoloration of her toes on the right foot. It was felt that she had an embolic event and was set up for arteriography in order to look for a source of embolization. On the right side, she had a focal 90% stenosis in the mid thigh in the superficial femoral artery with a filling defect suggesting clot. I felt that this was likely the source of embolization. She underwent successful angioplasty and stenting of a 90% right superficial femoral artery stenosis with a 5 mm x 6 on meters self-expanding stent and post dilatation with a 4 mm x 6 cm balloon area had an excellent result with no residual stenosis.  She comes in for follow up visit and repeat ABI's with arterial duplex. She is on Plavix.  She denise rest pain and symptoms of claudication.    Past Medical History:  Diagnosis Date  . Gastric outlet obstruction 08/2019  . Glaucoma   . Hyperlipidemia   . Hypertension   . Hypothyroid   . PAF (paroxysmal atrial fibrillation) ([place])   . Stroke [facility])     Past Surgical History:  Procedure Laterality Date  . ABDOMINAL AORTOGRAM W/LOWER EXTREMITY Right 09/11/2019   Procedure: ABDOMINAL AORTOGRAM W/LOWER EXTREMITY;  Surgeon: Waynetta Sandy, MD;  Location: [place] [facility];  Service: Cardiovascular;  Laterality: Right;  . ABDOMINAL HERNIA REPAIR    . GASTROSTOMY N/A 09/01/2019   Procedure: INSERTION OF GASTROSTOMY TUBE;  Surgeon: Erroll Luna, MD;  Location: [place];  Service: General;  Laterality: N/A;  . LAPAROSCOPIC LYSIS OF ADHESIONS  09/01/2019   Procedure: Laparoscopic Lysis Of Adhesions;  Surgeon: Erroll Luna, MD;  Location: [place];  Service: General;;  . LAPAROSCOPIC NISSEN FUNDOPLICATION N/A [id number]   Procedure: LAPAROSCOPIC, POSSIBLE OPEN, GASTROPEXY;  Surgeon: Erroll Luna, MD;  Location: [place];  Service: General;   Laterality: N/A;  . LOOP RECORDER INSERTION N/A 03/11/2017   Procedure: Loop Recorder Insertion;  Surgeon: Evans Lance, MD;  Location: [place] [facility];  Service: Cardiovascular;  Laterality: N/A;  . PERIPHERAL VASCULAR BALLOON ANGIOPLASTY Left 09/11/2019   Procedure: PERIPHERAL VASCULAR BALLOON ANGIOPLASTY;  Surgeon: Waynetta Sandy, MD;  Location: [place] [facility];  Service: Cardiovascular;  Laterality: Left;  TP trunk  . PERIPHERAL VASCULAR CATHETERIZATION Right 07/06/2016   Procedure: Lower Extremity Angiography;  Surgeon: Angelia Mould, MD;  Location: [place] [facility];  Service: Cardiovascular;  Laterality: Right;  . PERIPHERAL VASCULAR CATHETERIZATION Right 07/06/2016   Procedure: Peripheral Vascular Intervention;  Surgeon: Angelia Mould, MD;  Location: [place] [facility];  Service: Cardiovascular;  Laterality: Right;  Superficial femoral  . PERIPHERAL VASCULAR INTERVENTION Left 09/11/2019   Procedure: PERIPHERAL VASCULAR INTERVENTION;  Surgeon: Waynetta Sandy, MD;  Location: [place] [facility];  Service: Cardiovascular;  Laterality: Left;  SFA  . TEE WITHOUT CARDIOVERSION N/A 03/11/2017   Procedure: TRANSESOPHAGEAL ECHOCARDIOGRAM (TEE);  Surgeon: Thayer Headings, MD;  Location: [facility] ENDOSCOPY;  Service: Cardiovascular;  Laterality: N/A;  . TOTAL HIP ARTHROPLASTY      ROS:   General:  No weight loss, Fever, chills  HEENT: No recent headaches, no nasal bleeding, no visual changes, no sore throat  Neurologic: No dizziness, blackouts, seizures. No recent symptoms of stroke or mini- stroke. No recent episodes of slurred speech, or temporary blindness.  Cardiac:  No recent episodes of chest pain/pressure, no shortness of breath at rest.  No shortness of breath with exertion.  Denies history of atrial fibrillation or irregular heartbeat  Vascular: No history of rest pain in feet.  No history of claudication.  No history of non-healing ulcer, No  history of DVT   Pulmonary: No home oxygen, no productive cough, no hemoptysis,  No asthma or wheezing  Musculoskeletal:  [ ]  Arthritis, [ ]  Low back pain,  [ ]  Joint pain  Hematologic:No history of hypercoagulable state.  No history of easy bleeding.  No history of anemia  Gastrointestinal: No hematochezia or melena,  No gastroesophageal reflux, no trouble swallowing  Urinary: [ ]  chronic Kidney disease, [ ]  on HD - [ ]  MWF or [ ]  TTHS, [ ]  Burning with urination, [ ]  Frequent urination, [ ]  Difficulty urinating;   Skin: No rashes  Psychological: No history of anxiety,  No history of depression  Social History Social History   Tobacco Use  . Smoking status: Never Smoker  . Smokeless tobacco: Never Used  Substance Use Topics  . Alcohol use: No  . Drug use: No    Family History Family History  Problem Relation Age of Onset  . Stroke Father     Allergies  Allergies  Allergen Reactions  . Aspirin Other (See Comments)    Red splotches  . Atorvastatin Other (See Comments)    Muscle spams  . Nabumetone Itching  . Nsaids Other (See Comments)    Unknown reaction  . Pravastatin Other (See Comments)    Stiff,  walking difficulty   . Procaine Swelling  . Rosuvastatin Nausea Only and Other (See Comments)    Reflux and Muscle stiffness  . Simvastatin Nausea Only and Other (See Comments)    Reflux and Muscle stiffness  . Statins Other (See Comments)    Difficulty walking, muscle spasms, reflux, muscle stiffness  . Evolocumab Rash  . Mobic [Meloxicam] Rash  . Penicillins Rash    Has patient had a PCN reaction causing immediate rash, facial/tongue/throat swelling, SOB or lightheadedness with hypotension: Yes Has patient had a PCN reaction causing severe rash involving mucus membranes or skin necrosis: No Has patient had a PCN reaction that required hospitalization: No Has patient had a PCN reaction occurring within the last 10 years: No If all of the above answers are  "NO", then may proceed with Cephalosporin use.       Current Outpatient Medications  Medication Sig Dispense Refill  . amiodarone (PACERONE) 200 MG tablet Take 1 tablet (200 mg total) by mouth daily. 30 tablet 0  . ELIQUIS 5 MG TABS tablet TAKE 1 TABLET BY MOUTH TWO  TIMES DAILY (Patient taking differently: Take 5 mg by mouth 2 (two) times daily. ) 60 tablet 0  . latanoprost (XALATAN) 0.005 % ophthalmic solution Place 1 drop into both eyes at bedtime.    Marland Kitchen levothyroxine (SYNTHROID, LEVOTHROID) 100 MCG tablet Take 100 mcg by mouth daily before breakfast.     . lidocaine (LIDODERM) 5 % Place 1 patch onto the skin daily. Remove & Discard patch within 12 hours or as directed by MD (Patient not taking: Reported on 10/21/2019) 5 patch 0  . Multiple Vitamin (MULTIVITAMIN WITH MINERALS) TABS tablet Take 1 tablet by mouth daily. (Patient not taking: Reported on 10/21/2019)    . Nutritional Supplements (FEEDING SUPPLEMENT, BOOST BREEZE,) LIQD Place 1 Dose into feeding tube 2 (two) times daily.    Marland Kitchen omeprazole (PRILOSEC) 20  MG capsule Take 20 mg by mouth daily.     . ondansetron (ZOFRAN ODT) 4 MG disintegrating tablet Take 1 tablet (4 mg total) by mouth every 8 (eight) hours as needed for nausea or vomiting. (Patient not taking: Reported on 10/21/2019) 20 tablet 0  . Water For Irrigation, Sterile (FREE WATER) SOLN Place 100 mLs into feeding tube every 8 (eight) hours.     No current facility-administered medications for this visit.    Physical Examination  There were no vitals filed for this visit.  There is no height or weight on file to calculate BMI.  General:  Alert and oriented, no acute distress HEENT: Normal Neck: No bruit or JVD Pulmonary: Clear to auscultation bilaterally Cardiac: Regular Rate and Rhythm without murmur Abdomen: Soft, non-tender, non-distended, no mass, no scars Skin: No rash Extremity Pulses:  2+ radial, brachial, femoral, dorsalis pedis, posterior tibial pulses  bilaterally Musculoskeletal: No deformity or edema  Neurologic: Upper and lower extremity motor 5/5 and symmetric  DATA: ***   ASSESSMENT: ***   PLAN: ***   Roxy Horseman PA-C [facility] Office: [phone]  MD in clinic [place]

## 2019-11-09 ENCOUNTER — Encounter (HOSPITAL_COMMUNITY): Payer: Medicare Other

## 2019-11-09 ENCOUNTER — Ambulatory Visit: Payer: Medicare Other

## 2020-02-13 ENCOUNTER — Emergency Department (HOSPITAL_COMMUNITY): Payer: Medicare Other

## 2020-02-13 ENCOUNTER — Encounter (HOSPITAL_COMMUNITY): Payer: Self-pay | Admitting: Emergency Medicine

## 2020-02-13 ENCOUNTER — Emergency Department (HOSPITAL_COMMUNITY)
Admission: EM | Admit: 2020-02-13 | Discharge: 2020-02-13 | Disposition: A | Discharge status: Home / Self Care | Payer: Medicare Other | Attending: Emergency Medicine | Admitting: Emergency Medicine

## 2020-02-13 DIAGNOSIS — Y939 Activity, unspecified: Secondary | ICD-10-CM | POA: Diagnosis not present

## 2020-02-13 DIAGNOSIS — F0390 Unspecified dementia without behavioral disturbance: Secondary | ICD-10-CM | POA: Insufficient documentation

## 2020-02-13 DIAGNOSIS — Y999 Unspecified external cause status: Secondary | ICD-10-CM | POA: Insufficient documentation

## 2020-02-13 DIAGNOSIS — Z95818 Presence of other cardiac implants and grafts: Secondary | ICD-10-CM | POA: Diagnosis not present

## 2020-02-13 DIAGNOSIS — R627 Adult failure to thrive: Secondary | ICD-10-CM | POA: Diagnosis not present

## 2020-02-13 DIAGNOSIS — Z79899 Other long term (current) drug therapy: Secondary | ICD-10-CM | POA: Insufficient documentation

## 2020-02-13 DIAGNOSIS — R633 Feeding difficulties: Secondary | ICD-10-CM | POA: Diagnosis not present

## 2020-02-13 DIAGNOSIS — Z20822 Contact with and (suspected) exposure to covid-19: Secondary | ICD-10-CM | POA: Insufficient documentation

## 2020-02-13 DIAGNOSIS — E039 Hypothyroidism, unspecified: Secondary | ICD-10-CM | POA: Diagnosis not present

## 2020-02-13 DIAGNOSIS — R270 Ataxia, unspecified: Secondary | ICD-10-CM | POA: Diagnosis not present

## 2020-02-13 DIAGNOSIS — Z7901 Long term (current) use of anticoagulants: Secondary | ICD-10-CM | POA: Diagnosis not present

## 2020-02-13 DIAGNOSIS — Z7902 Long term (current) use of antithrombotics/antiplatelets: Secondary | ICD-10-CM | POA: Diagnosis not present

## 2020-02-13 DIAGNOSIS — Y929 Unspecified place or not applicable: Secondary | ICD-10-CM | POA: Insufficient documentation

## 2020-02-13 DIAGNOSIS — R4182 Altered mental status, unspecified: Secondary | ICD-10-CM | POA: Diagnosis present

## 2020-02-13 DIAGNOSIS — Z96649 Presence of unspecified artificial hip joint: Secondary | ICD-10-CM | POA: Insufficient documentation

## 2020-02-13 DIAGNOSIS — W19XXXA Unspecified fall, initial encounter: Secondary | ICD-10-CM | POA: Diagnosis not present

## 2020-02-13 DIAGNOSIS — I10 Essential (primary) hypertension: Secondary | ICD-10-CM | POA: Diagnosis not present

## 2020-02-13 LAB — COMPREHENSIVE METABOLIC PANEL
ALT: 16 U/L (ref 0–44)
AST: 15 U/L (ref 15–41)
Albumin: 3.1 g/dL — ABNORMAL LOW (ref 3.5–5.0)
Alkaline Phosphatase: 69 U/L (ref 38–126)
Anion gap: 9 (ref 5–15)
BUN: 24 mg/dL — ABNORMAL HIGH (ref 8–23)
CO2: 27 mmol/L (ref 22–32)
Calcium: 8.7 mg/dL — ABNORMAL LOW (ref 8.9–10.3)
Chloride: 101 mmol/L (ref 98–111)
Creatinine, Ser: 1.35 mg/dL — ABNORMAL HIGH (ref 0.44–1.00)
GFR calc Af Amer: 42 mL/min — ABNORMAL LOW (ref >60)
GFR calc non Af Amer: 36 mL/min — ABNORMAL LOW (ref >60)
Glucose, Bld: 100 mg/dL — ABNORMAL HIGH (ref 70–99)
Potassium: 3.9 mmol/L (ref 3.5–5.1)
Sodium: 137 mmol/L (ref 135–145)
Total Bilirubin: 0.3 mg/dL (ref 0.3–1.2)
Total Protein: 6.7 g/dL (ref 6.5–8.1)

## 2020-02-13 LAB — CBC WITH DIFFERENTIAL/PLATELET
Abs Immature Granulocytes: 0.04 10*3/uL (ref 0.00–0.07)
Basophils Absolute: 0 10*3/uL (ref 0.0–0.1)
Basophils Relative: 0 %
Eosinophils Absolute: 0.1 10*3/uL (ref 0.0–0.5)
Eosinophils Relative: 1 %
HCT: 36.7 % (ref 36.0–46.0)
Hemoglobin: 11.8 g/dL — ABNORMAL LOW (ref 12.0–15.0)
Immature Granulocytes: 0 %
Lymphocytes Relative: 21 %
Lymphs Abs: 1.9 10*3/uL (ref 0.7–4.0)
MCH: 30.7 pg (ref 26.0–34.0)
MCHC: 32.2 g/dL (ref 30.0–36.0)
MCV: 95.6 fL (ref 80.0–100.0)
Monocytes Absolute: 0.9 10*3/uL (ref 0.1–1.0)
Monocytes Relative: 10 %
Neutro Abs: 6.1 10*3/uL (ref 1.7–7.7)
Neutrophils Relative %: 68 %
Platelets: 203 10*3/uL (ref 150–400)
RBC: 3.84 MIL/uL — ABNORMAL LOW (ref 3.87–5.11)
RDW: 13.6 % (ref 11.5–15.5)
WBC: 8.9 10*3/uL (ref 4.0–10.5)
nRBC: 0 % (ref 0.0–0.2)

## 2020-02-13 LAB — URINALYSIS, COMPLETE (UACMP) WITH MICROSCOPIC
Bacteria, UA: NONE SEEN
Bilirubin Urine: NEGATIVE
Glucose, UA: NEGATIVE mg/dL
Hgb urine dipstick: NEGATIVE
Ketones, ur: NEGATIVE mg/dL
Leukocytes,Ua: NEGATIVE
Nitrite: NEGATIVE
Protein, ur: NEGATIVE mg/dL
Specific Gravity, Urine: 1.016 (ref 1.005–1.030)
pH: 7 (ref 5.0–8.0)

## 2020-02-13 LAB — PROTIME-INR
INR: 1 (ref 0.8–1.2)
Prothrombin Time: 12.7 seconds (ref 11.4–15.2)

## 2020-02-13 LAB — VALPROIC ACID LEVEL: Valproic Acid Lvl: 26 ug/mL — ABNORMAL LOW (ref 50.0–100.0)

## 2020-02-13 LAB — RESPIRATORY PANEL BY RT PCR (FLU A&B, COVID)
Influenza A by PCR: NEGATIVE
Influenza B by PCR: NEGATIVE
SARS Coronavirus 2 by RT PCR: NEGATIVE

## 2020-02-13 LAB — CBG MONITORING, ED: Glucose-Capillary: 96 mg/dL (ref 70–99)

## 2020-02-13 MED ORDER — VALPROIC ACID 250 MG/5ML PO SOLN
750.0000 mg | Freq: Once | ORAL | Status: DC
  Filled 2020-02-13: qty 15

## 2020-02-13 NOTE — Discharge Instructions (Signed)
Stop taking the Depakote now which seems to be causing her to be sleepy.  We are going to have the home health service evaluate her in the home hopefully tomorrow for further care and treatment as needed.  Follow-up with your primary care doctor next week for checkup.  Return here, if needed.

## 2020-02-13 NOTE — ED Notes (Signed)
Pt transported to imaging.

## 2020-02-13 NOTE — ED Notes (Signed)
ED Provider at bedside. 

## 2020-02-13 NOTE — ED Notes (Signed)
pts son is upset and states he needs an update about what is going on with his mother she was supposed to et her meds and a  Bed  Upstairs. This nurse explained to son that there  Are not currenlty any available beds on the floor and the provider would be notified to come by and give a status update. This nurse then notified the provider of the situation

## 2020-02-13 NOTE — ED Notes (Signed)
Son states that since pt fell the other week, pt has only been using wheelchair to get around at home. Messaged Nanavati to clarify ambulating order, awaiting response.

## 2020-02-13 NOTE — ED Provider Notes (Signed)
5:02 PM-checkout from Dr. Kathrynn Humble to evaluate patient, after presenting for altered mental status with fall and not currently walking.  She is also having trouble eating which seems to be a coordination problem with the mechanism of eating.  She is to get an MRI of the brain, query stroke, and will likely need to be admitted.   Clinical Course as of Feb 13 1254  Tue Feb 13, 2020  1731 Your work-up is negative.  Given the acute change in her status, MRI brain ordered.   [AN]  2015 Normal  Respiratory Panel by RT PCR (Flu A&B, Covid) - Nasopharyngeal Swab [EW]  2016 Normal except glucose high, BUN high, creatinine high, calcium low, albumin low, GFR low  Comprehensive metabolic panel(!) [EW]  [id number] Normal  Urinalysis, Complete w Microscopic(!) [EW]  2016 Abnormal, subtherapeutic  Valproic acid level(!) [EW]  2016 Normal except hemoglobin low  CBC WITH DIFFERENTIAL(!) [EW]  2016 Per radiologist, no acute infarct.  MR BRAIN WO CONTRAST [EW]  2017 Small right-sided pleural effusion, no infiltrate or congestive heart failure  DG Chest Port 1 View [EW]  Wed Feb 14, 2020  1252 Glucose, UA: NEGATIVE [EW]  1253 Prothrombin Time: 12.7 [EW]    Clinical Course User Index [AN] Varney Biles, MD [EW] Daleen Bo, MD   Patient Vitals for the past 24 hrs:  BP Temp Temp src Pulse Resp SpO2  02/13/20 2000 (!) 151/90 -- -- 81 13 96 %  02/13/20 1900 (!) 150/98 -- -- 79 18 98 %  02/13/20 1828 114/73 -- -- 77 13 98 %  02/13/20 1530 139/70 -- -- 75 19 96 %  02/13/20 1500 (!) 147/79 -- -- 76 17 96 %  02/13/20 1430 (!) 154/87 -- -- 75 19 97 %  02/13/20 1400 (!) 150/81 -- -- 74 19 96 %  02/13/20 1330 (!) 144/84 -- -- 76 20 97 %  02/13/20 1302 (!) 153/81 98.7 F (37.1 C) Oral 74 (!) 21 97 %  02/13/20 1300 (!) 137/108 -- -- 79 (!) 22 97 %  02/13/20 1212 (!) 167/82 97.6 F (36.4 C) Oral 79 18 99 %   8:40 PM-ED evaluation has been completed.  Patient's son is in the room and states that she is  almost back to normal, and is no longer disoriented.  He also states she was able to eat, when he helped her.  He states that she is on Depakote for control of combative behavior, which was initiated when she was in a rehab facility about 2 months ago.  Because she has continued to be sedated at home, the patient's caregiver and the son are interested in having the Depakote stopped.  They talked to her PCP about it on the last visit 2 weeks ago but there were no changes made in the medications.  8:17 PM Reevaluation with update and discussion. After initial assessment and treatment, an updated evaluation reveals patient is alert, and interactive though confused.  She is not in any distress.  Findings discussed with patient and her son, all questions were answered. Daleen Bo   Medical Decision Making:  This patient is presenting for evaluation of altered mental status, and difficulty walking, which does require a range of treatment options, and he is a complaint that involves a moderate risk of morbidity and mortality. The differential diagnoses include dementia, debilitated state, serious illness. I decided  to review old records, and in summary elderly patient who was recently released from a rehab, to home after  hospitalization.. I obtained additional historical information from her son. Clinical Laboratory Tests Ordered, included urinalysis, valproic acid level, CBC, metabolic panel.  Labs reviewed; these are reassuring without evidence for UTI, or metabolic instability.  Depakote level is low. Radiologic Tests Ordered, chest x-ray, CT head, abdominal x-ray, and MRI brain Reviewed by radiology and show no acute abnormality,   Critical Interventions-clinical exam, laboratory testing, MRI imaging, radiography and CT.  After These Interventions, the Patient was reevaluated and was found improved, eating well, and near her baseline according to her son who was in the room with her.  Her son  correlates the altered mental status and sleepiness, with initiation of use of Depakote for treatment of combative behavior while she was in the rehab facility, 2 months ago.  She has never had a seizure, after her stroke nor prior to it.  She is not on Depakote for seizure control.  There is no indication at this point for continuing the Depakote.  I have recommended that the Depakote be discontinued, she be monitored closely by following up with her PCP, and ordered in-home health assessments by home health service.  CRITICAL CARE-no Performed by: Daleen Bo   Nursing Notes Reviewed/ Care Coordinated Applicable Imaging Reviewed Interpretation of Laboratory Data incorporated into ED treatment  The patient appears reasonably screened and/or stabilized for discharge and I doubt any other medical condition or other [facility] requiring further screening, evaluation, or treatment in the ED at this time prior to discharge.  Plan: Home Medications-continue home medicines except Depakote; Home Treatments-Next, fluids, regular diet; return here if the recommended treatment, does not improve the symptoms; Recommended follow up-services in the home, ordered more comprehensive services, PCP follow-up 1 week.        Daleen Bo, MD 02/14/20 1255

## 2020-02-13 NOTE — ED Triage Notes (Signed)
Per [facility] pt from home for AMS. Lives with family and last seen normal was 47p yesterday when went to PCP. Pt has dementia and is walker/wheelchair bound. EMS states that patient alert to self.  169/100, 83HR, 24R, 97% on RA. CBg 119. 97.3 temp.  Goes by "Alyssa Odonnell".

## 2020-02-13 NOTE — ED Notes (Signed)
X-ray at bedside

## 2020-02-13 NOTE — ED Notes (Addendum)
Provider at bedside. Provider notified care giver looking for status update

## 2020-02-13 NOTE — ED Provider Notes (Signed)
[facility] DEPT Provider Note   CSN: [id number] Arrival date & time: 02/13/20  1201     History Chief Complaint  Patient presents with  . Altered Mental Status    Alyssa Odonnell is a 86 y.o. female.  HPI     86 year old female comes in a chief complaint of altered mental status.  I have called patient's home phone number and also her brother's phone number, nobody is picking up either of those calls.  Patient has no complaints from her side.  She admits to having dementia.  She is oriented to self, location and is able to tell me her date of birth.  Patient has no headaches, chest pain, cough, fevers, UTI-like symptoms, abdominal pain, back pain.  Past Medical History:  Diagnosis Date  . Gastric outlet obstruction 08/2019  . Glaucoma   . Hyperlipidemia   . Hypertension   . Hypothyroid   . PAF (paroxysmal atrial fibrillation) ([place])   . Stroke [facility])     Patient Active Problem List   Diagnosis Date Noted  . Palliative care by specialist   . DNR (do not resuscitate)   . Malnutrition of moderate degree 10/02/2019  . Pressure injury of skin 10/01/2019  . Malnutrition ([place]) 10/01/2019  . Hypothyroid 10/01/2019  . Failure to thrive in adult 09/30/2019  . Pleural effusion, bilateral 09/30/2019  . Chronic anticoagulation 09/30/2019  . 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 Gastric outlet obstruction 08/29/2019  . PAF (paroxysmal atrial fibrillation) ([place]) 02/02/2018  . 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[place]) 05/24/2017  . Cerebrovascular accident (CVA) due to embolism of left posterior cerebral artery ([place]) 03/09/2017  . htn 03/09/2017  . Generalized weakness 03/09/2017  . Stroke-like symptom 03/09/2017  . Right sided weakness   . PAD (peripheral artery disease) ([place])   . Essential hypertension   . Spinal stenosis of lumbar region   . Pure hypercholesterolemia   . Recurrent major depressive disorder [facility])     Past Surgical History:  Procedure  Laterality Date  . ABDOMINAL AORTOGRAM W/LOWER EXTREMITY Right 09/11/2019   Procedure: ABDOMINAL AORTOGRAM W/LOWER EXTREMITY;  Surgeon: Waynetta Sandy, MD;  Location: [place] CV LAB;  Service: Cardiovascular;  Laterality: Right;  . ABDOMINAL HERNIA REPAIR    . GASTROSTOMY N/A 09/01/2019   Procedure: INSERTION OF GASTROSTOMY TUBE;  Surgeon: Erroll Luna, MD;  Location: [facility];  Service: General;  Laterality: N/A;  . LAPAROSCOPIC LYSIS OF ADHESIONS  09/01/2019   Procedure: Laparoscopic Lysis Of Adhesions;  Surgeon: Erroll Luna, MD;  Location: [place];  Service: General;;  . LAPAROSCOPIC NISSEN FUNDOPLICATION N/A [id number]   Procedure: LAPAROSCOPIC, POSSIBLE OPEN, GASTROPEXY;  Surgeon: Erroll Luna, MD;  Location: [place];  Service: General;  Laterality: N/A;  . LOOP RECORDER INSERTION N/A 03/11/2017   Procedure: Loop Recorder Insertion;  Surgeon: Evans Lance, MD;  Location: [place] CV LAB;  Service: Cardiovascular;  Laterality: N/A;  . PERIPHERAL VASCULAR BALLOON ANGIOPLASTY Left 09/11/2019   Procedure: PERIPHERAL VASCULAR BALLOON ANGIOPLASTY;  Surgeon: Waynetta Sandy, MD;  Location: [place] CV LAB;  Service: Cardiovascular;  Laterality: Left;  TP trunk  . PERIPHERAL VASCULAR CATHETERIZATION Right 07/06/2016   Procedure: Lower Extremity Angiography;  Surgeon: Angelia Mould, MD;  Location: [place] CV LAB;  Service: Cardiovascular;  Laterality: Right;  . PERIPHERAL VASCULAR CATHETERIZATION Right 07/06/2016   Procedure: Peripheral Vascular Intervention;  Surgeon: Angelia Mould, MD;  Location: [place] CV LAB;  Service: Cardiovascular;  Laterality: Right;  Superficial femoral  . PERIPHERAL VASCULAR INTERVENTION Left 09/11/2019   Procedure: PERIPHERAL VASCULAR INTERVENTION;  Surgeon: Waynetta Sandy, MD;  Location: [place] CV LAB;  Service: Cardiovascular;  Laterality: Left;  SFA  . TEE WITHOUT CARDIOVERSION N/A 03/11/2017    Procedure: TRANSESOPHAGEAL ECHOCARDIOGRAM (TEE);  Surgeon: Acie Fredrickson Wonda Cheng, MD;  Location: [facility] ENDOSCOPY;  Service: Cardiovascular;  Laterality: N/A;  . TOTAL HIP ARTHROPLASTY       OB History   No obstetric history on file.     Family History  Problem Relation Age of Onset  . Stroke Father     Social History   Tobacco Use  . Smoking status: Never Smoker  . Smokeless tobacco: Never Used  Substance Use Topics  . Alcohol use: No  . Drug use: No    Home Medications Prior to Admission medications   Medication Sig Start Date End Date Taking? Authorizing Provider  amiodarone (PACERONE) 200 MG tablet Take 1 tablet (200 mg total) by mouth daily. 09/26/19 02/13/20 Yes Donne Hazel, MD  clopidogrel (PLAVIX) 75 MG tablet Take 75 mg by mouth daily.   Yes [provider]  divalproex (DEPAKOTE SPRINKLE) 125 MG capsule Take 375 mg by mouth 3 (three) times daily.    Yes [provider]  ELIQUIS 5 MG TABS tablet TAKE 1 TABLET BY MOUTH TWO  TIMES DAILY Patient taking differently: Take 5 mg by mouth 2 (two) times daily.  03/14/19  Yes Evans Lance, MD  escitalopram (LEXAPRO) 20 MG tablet Take 20 mg by mouth daily.   Yes [provider]  latanoprost (XALATAN) 0.005 % ophthalmic solution Place 1 drop into both eyes at bedtime. 05/11/16  Yes [provider]  levothyroxine (SYNTHROID, LEVOTHROID) 100 MCG tablet Take 100 mcg by mouth daily before breakfast.  05/11/16  Yes [provider]  LORazepam (ATIVAN) 0.5 MG tablet Take 0.5 mg by mouth 2 (two) times daily. 12pm and bedtime   Yes [provider]  megestrol (MEGACE) 40 MG/ML suspension Take 400 mg by mouth daily.   Yes [provider]  Multiple Vitamin (MULTIVITAMIN WITH MINERALS) TABS tablet Take 1 tablet by mouth daily. 10/06/19  Yes Georgette Shell, MD  pantoprazole (PROTONIX) 40 MG tablet Take 40 mg by mouth daily.   Yes [provider]  traMADol (ULTRAM) 50 MG  tablet Take 50 mg by mouth 2 (two) times daily as needed for pain. 02/12/20  Yes [provider]  lidocaine (LIDODERM) 5 % Place 1 patch onto the skin daily. Remove & Discard patch within 12 hours or as directed by MD Patient not taking: Reported on 10/21/2019 09/25/19   Donne Hazel, MD  ondansetron (ZOFRAN ODT) 4 MG disintegrating tablet Take 1 tablet (4 mg total) by mouth every 8 (eight) hours as needed for nausea or vomiting. Patient not taking: Reported on 10/21/2019 10/12/19   LongWonda Olds, MD    Allergies    Aspirin, Atorvastatin, Nabumetone, Nsaids, Pravastatin, Procaine, Rosuvastatin, Simvastatin, Statins, Evolocumab, Mobic [meloxicam], and Penicillins  Review of Systems   Review of Systems  Unable to perform ROS: Dementia    Physical Exam Updated Vital Signs BP 139/70   Pulse 75   Temp 98.7 F (37.1 C) (Oral)   Resp 19   SpO2 96%   Physical Exam Vitals and nursing note reviewed.  Constitutional:      Appearance: She is well-developed.  HENT:     Head: Normocephalic and atraumatic.  Eyes:  Extraocular Movements: Extraocular movements intact.     Pupils: Pupils are equal, round, and reactive to light.  Cardiovascular:     Rate and Rhythm: Normal rate.  Pulmonary:     Effort: Pulmonary effort is normal.  Abdominal:     General: Bowel sounds are normal.  Musculoskeletal:        General: No deformity.     Cervical back: Normal range of motion and neck supple.  Skin:    General: Skin is warm and dry.  Neurological:     Mental Status: She is alert and oriented to person, place, and time.     Cranial Nerves: No cranial nerve deficit.     Sensory: No sensory deficit.     Motor: No weakness.     Coordination: Coordination normal.     ED Results / Procedures / Treatments   Labs (all labs ordered are listed, but only abnormal results are displayed) Labs Reviewed  COMPREHENSIVE METABOLIC PANEL - Abnormal; Notable for the following components:       Result Value   Glucose, Bld 100 (*)    BUN 24 (*)    Creatinine, Ser 1.35 (*)    Calcium 8.7 (*)    Albumin 3.1 (*)    GFR calc non Af Amer 36 (*)    GFR calc Af Amer 42 (*)    All other components within normal limits  CBC WITH DIFFERENTIAL/PLATELET - Abnormal; Notable for the following components:   RBC 3.84 (*)    Hemoglobin 11.8 (*)    All other components within normal limits  URINALYSIS, COMPLETE (UACMP) WITH MICROSCOPIC - Abnormal; Notable for the following components:   APPearance HAZY (*)    All other components within normal limits  VALPROIC ACID LEVEL - Abnormal; Notable for the following components:   Valproic Acid Lvl 26 (*)    All other components within normal limits  RESPIRATORY PANEL BY RT PCR (FLU A&B, COVID)  PROTIME-INR  CBG MONITORING, ED    EKG EKG Interpretation  Date/Time:  Tuesday February 13 2020 13:22:46 EDT Ventricular Rate:  76 PR Interval:    QRS Duration: 82 QT Interval:  410 QTC Calculation: 461 R Axis:   38 Text Interpretation: Sinus or ectopic atrial rhythm No acute changes No significant change since last tracing Confirmed by Varney Biles [id number]) on 02/13/2020 4:07:41 PM   Radiology DG Abdomen 1 View  Result Date: 02/13/2020 CLINICAL DATA:  Altered mental status EXAM: ABDOMEN - 1 VIEW COMPARISON:  10/05/2019 FINDINGS: Nonobstructed gas pattern. Clips in the right upper quadrant. Right hip replacement. Advanced arthritis left hip. Calcified mass in the pelvis likely a fibroid. IMPRESSION: Negative. Electronically Signed   By: Donavan Foil M.D.   On: 02/13/2020 17:22   CT Head Wo Contrast  Result Date: 02/13/2020 CLINICAL DATA:  Ataxia, stroke suspected; neuro deficit, acute, stroke suspected. Additional history provided: Altered mental status, last seen normal 2:30 p.m. yesterday, history of dementia. EXAM: CT HEAD WITHOUT CONTRAST TECHNIQUE: Contiguous axial images were obtained from the base of the skull through the vertex without  intravenous contrast. COMPARISON:  Brain MRI 03/09/2017, noncontrast head CT and CT angiogram head/neck 03/09/2017. FINDINGS: Brain: Redemonstrated chronic cortically based infarct within the paramedian left occipital parietal lobes within the left PCA vascular territory. Advanced patchy and confluent hypodensity within the cerebral white matter is nonspecific, but consistent with chronic small vessel ischemic disease. Findings have progressed as compared to prior examinations 03/09/2017. Redemonstrated chronic lacunar infarct within the right  basal ganglia. Additional tiny hypodensities within the bilateral basal ganglia likely reflect previously demonstrated prominent perivascular spaces. Stable, mild generalized parenchymal atrophy. There is no acute intracranial hemorrhage. No acute demarcated cortical infarct. No extra-axial fluid collection. No evidence of intracranial mass. No midline shift. Vascular: No hyperdense vessel.  Atherosclerotic calcifications. Skull: Normal. Negative for fracture or focal lesion. Sinuses/Orbits: Visualized orbits show no acute finding. No significant paranasal sinus disease or mastoid effusion at the imaged levels. IMPRESSION: 1. No evidence of acute intracranial abnormality. 2. Redemonstrated chronic cortically based left occipital-parietal infarct. 3. Advanced chronic small vessel ischemic disease within the cerebral white matter has progressed as compared to prior exams 03/09/2017. Redemonstrated chronic right basal ganglia lacunar infarct. 4. Stable, mild generalized parenchymal atrophy. Electronically Signed   By: Kellie Simmering DO   On: 02/13/2020 16:21   DG Chest Port 1 View  Result Date: 02/13/2020 CLINICAL DATA:  Altered mental status with confusion EXAM: PORTABLE CHEST 1 VIEW COMPARISON:  September 30, 2019 chest radiograph and chest CT September 30, 2019 FINDINGS: There is a right pleural effusion with atelectatic change in the right base. Lungs elsewhere are clear.  Heart is upper normal in size with pulmonary vascularity normal. No adenopathy. There is a paraesophageal hernia, primarily to the right of midline. Loop recorder seen on the left. No bone lesions. IMPRESSION: Right pleural effusion with right base atelectasis. Lungs otherwise clear. Stable cardiac silhouette. Paraesophageal hernia noted.  Loop recorder on left. Electronically Signed   By: Lowella Grip III M.D.   On: 02/13/2020 14:36    Procedures Procedures (including critical care time)  Medications Ordered in ED Medications - No data to display  ED Course  I have reviewed the triage vital signs and the nursing notes.  Pertinent labs & imaging results that were available during my care of the patient were reviewed by me and considered in my medical decision making (see chart for details).  Clinical Course as of Feb 13 1732  Tue Feb 13, 2020  1731 Your work-up is negative.  Given the acute change in her status, MRI brain ordered.   [AN]    Clinical Course User Index [AN] Varney Biles, MD   MDM Rules/Calculators/A&P                      86 year old comes in a chief complaint of altered mental status  DDx includes: ICH / Stroke ACS Sepsis syndrome Infection - UTI/Pneumonia Encephalopathy  Electrolyte abnormality Drug overdose DKA Metabolic disorders including thyroid disorders, adrenal insufficiency  Patient comes in a chief complaint of altered mental status with  I have received history from both patient's son and the caregiver.  It appears that patient has been staying at home with caregiver.  Normally patient is able to walk around her house.  She gets PT at home as well.  On 5 April she had a fall, since then she has not been herself.  More importantly over the last 2 days patient's mental status has declined and she has been more confused.  According to son patient is not able to ambulate anymore.  She has history of prior strokes.  Given the acute change in  her mental status, she will need stroke rule out.  Dr. Eulis Foster to follow-up with the results of MRI. I have reviewed patient's medication, she has history of A. fib and is on Eliquis and is also on Depakote for unknown etiology.  Lab results continue to look fine. If  her MRI is normal, that she still might need to be admitted to the hospital for acute decline.    Final Clinical Impression(s) / ED Diagnoses Final diagnoses:  Failure to thrive in adult  Altered mental status, unspecified altered mental status type    Rx / DC Orders ED Discharge Orders    None       Varney Biles, MD 02/13/20 1733

## 2020-05-15 NOTE — Progress Notes (Addendum)
[facility]    Provider:  Dr Alyssa Odonnell Requesting Provider: Cathi Roan, PA Primary Care Provider:  Drake Leach, MD  CC:  Memory loss  HPI:  Alyssa Odonnell is a 86 y.o. female here as requested by Alyssa Loveless, PA for memory impairment. PMHx  . I reviewed Alyssa Roan, PA's notes: Past medical history hypertension, peripheral artery disease, left PCA stroke, chronic venous insufficiency, paroxysmal atrial fib, migraine headache, chronic low back pain with sciatica, hearing loss, high cholesterol, obstructive sleep apnea, obesity, bilateral leg edema, spinal stenosis of the lumbar region with neurogenic claudication, depression, malnutrition, generalized weakness.  Per Alyssa Odonnell patient has depression because daughter passed away in 2023-10-21 when she was in the hospital, she wakes up not knowing where she is and has anxiety, she presented with her caretaker, she has been hospitalized several times over the last year for FTT and dehydration, remote history of stroke with some continued right-sided weakness causing gait instability, she mostly uses a wheelchair but occasionally uses walker to ambulate, she is having issues with her memory, both short-term and long-term, there are some concerns regarding estate management and medication administration, she needs help by caregivers preparing meals, taking her medication and paying her bills on time, she does not drive, over the last few months she has had increased confusion often waking up at night anxious and sure where she is, she also has depression and anxiety.  Patient is here with her son who provides much information.  She has memory impairment history of stroke in 2018. She had a stroke last year and she has noticed worsening since then, there are times "I can't recall things" such as on the MMSE test today. She lives at home and has 24x7 care with CNAs, they help with her at home, she was an Health and safety inspector. Son says  she has been having memory problems started years ago with little things, memory loss, it "comes and goes", repeating things in the same day, she doesn't know that she is at home and then she is fine, lots of frustration. She is getting family members mixed up. Progressive more so in the last 6 months. Forgetting conversations. She neds help with all her IADLs and ADLS. No family history of alzheimer's disease. She gets agitated, and frustrated, she feels like she is in a prison, she refuses help, she has a hard time and is frustrated. Depakote was very high and felt like she was a zombie. Acting up usually in the afternoon, she was on 375mg  tid.    Reviewed notes, labs and imaging from outside physicians, which showed:  MRI brain 02/13/2020: Motion degraded exam. No acute abnormality seen. Old left occipital stroke. Chronic small-vessel ischemic changes throughout the brain appear advanced.  CT head 02/13/2020: IMPRESSION: 1. No evidence of acute intracranial abnormality. 2. Redemonstrated chronic cortically based left occipital-parietal infarct. 3. Advanced chronic small vessel ischemic disease within the cerebral white matter has progressed as compared to prior exams 03/09/2017. Redemonstrated chronic right basal ganglia lacunar infarct. 4. Stable, mild generalized parenchymal atrophy.  Most recent labs in the emergency room showed a BUN of 24 and creatinine of 1.35, slightly decreased hemoglobin of 11.8, valproic acid level 26 (Depakote for combative behavior while in a rehab facility and discussion was had to stop it)  Review of Systems: Patient complains of symptoms per HPI as well as the following symptoms: Memory loss, confusion, depression, anxiety, stroke. Pertinent negatives and positives per HPI. All others negative.  Social History   Socioeconomic History  . Marital status: Widowed    Spouse name: Not on file  . Number of children: Not on file  . Years of education: Not on  file  . Highest education level: Not on file  Occupational History  . Not on file  Tobacco Use  . Smoking status: Never Smoker  . Smokeless tobacco: Never Used  Vaping Use  . Vaping Use: Never used  Substance and Sexual Activity  . Alcohol use: Never  . Drug use: Never  . Sexual activity: Not on file  Other Topics Concern  . Not on file  Social History Narrative   Lives at home alone, has caregivers 24/7   Caffeine: 1 cup coffee per day   Social Determinants of Health   Financial Resource Strain:   . Difficulty of Paying Living Expenses:   Food Insecurity:   . Worried About Charity fundraiser in the Last Year:   . Arboriculturist in the Last Year:   Transportation Needs:   . Film/video editor (Medical):   Marland Kitchen Lack of Transportation (Non-Medical):   Physical Activity:   . Days of Exercise per Week:   . Minutes of Exercise per Session:   Stress:   . Feeling of Stress :   Social Connections:   . Frequency of Communication with Friends and Family:   . Frequency of Social Gatherings with Friends and Family:   . Attends Religious Services:   . Active Member of Clubs or Organizations:   . Attends Archivist Meetings:   Marland Kitchen Marital Status:   Intimate Partner Violence:   . Fear of Current or Ex-Partner:   . Emotionally Abused:   Marland Kitchen Physically Abused:   . Sexually Abused:     Family History  Problem Relation Age of Onset  . Osteoporosis Mother   . Hypertension Mother   . Heart failure Mother   . Stroke Father   . Heart disease Father   . Heart attack Father   . Cataracts Brother   . Colon cancer Paternal Aunt   . Glaucoma Paternal Aunt   . Dementia Neg Hx   . Alzheimer's disease Neg Hx     Past Medical History:  Diagnosis Date  . Acid reflux   . Acquired hammer toe   . Adenomatous colon polyp   . Alopecia   . Arthralgia of pelvic region and thigh   . Bunion   . Callus   . Cataract   . Gastric outlet obstruction 08/2019  . Glaucoma   . H/O  acute bronchitis   . Hearing loss, sensorineural   . Hx of anaphylactic shock   . Hyperlipidemia   . Hypertension   . Hypothyroid   . Impingement syndrome of shoulder   . Incisional hernia, incarcerated   . Migraine headache   . Non-neoplastic nevus   . Osteoarthritis, knee   . PAF (paroxysmal atrial fibrillation) ([place])   . Plantar fat pad atrophy   . RLS (restless legs syndrome)   . Stroke ([place])   . Tinea corporis   . UTI (urinary tract infection)   . Vitamin D deficiency     Patient Active Problem List   Diagnosis Date Noted  . Palliative care by specialist   . DNR (do not resuscitate)   . Malnutrition of moderate degree 10/02/2019  . Pressure injury of skin 10/01/2019  . Malnutrition ([place]) 10/01/2019  . Hypothyroid 10/01/2019  . Failure to thrive  in adult 09/30/2019  . Pleural effusion, bilateral 09/30/2019  . Chronic anticoagulation 09/30/2019  . 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 Gastric outlet obstruction 08/29/2019  . PAF (paroxysmal atrial fibrillation) ([place]) 02/02/2018  . 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[place]) 05/24/2017  . Cerebrovascular accident (CVA) due to embolism of left posterior cerebral artery ([place]) 03/09/2017  . htn 03/09/2017  . Generalized weakness 03/09/2017  . Stroke-like symptom 03/09/2017  . Right sided weakness   . PAD (peripheral artery disease) ([place])   . Essential hypertension   . Spinal stenosis of lumbar region   . Pure hypercholesterolemia   . Recurrent major depressive disorder [facility])     Past Surgical History:  Procedure Laterality Date  . ABDOMINAL AORTOGRAM W/LOWER EXTREMITY Right 09/11/2019   Procedure: ABDOMINAL AORTOGRAM W/LOWER EXTREMITY;  Surgeon: Waynetta Sandy, MD;  Location: [facility];  Service: Cardiovascular;  Laterality: Right;  . ABDOMINAL HERNIA REPAIR    . CHOLECYSTECTOMY    . COLONOSCOPY    . GASTROSTOMY N/A 09/01/2019   Procedure: INSERTION OF GASTROSTOMY TUBE;  Surgeon: Erroll Luna, MD;  Location:  [place];  Service: General;  Laterality: N/A;  . hernia repair w/ mesh    . HIATAL HERNIA REPAIR    . LAPAROSCOPIC LYSIS OF ADHESIONS  09/01/2019   Procedure: Laparoscopic Lysis Of Adhesions;  Surgeon: Erroll Luna, MD;  Location: [facility];  Service: General;;  . LAPAROSCOPIC NISSEN FUNDOPLICATION N/A 33/29/5188   Procedure: LAPAROSCOPIC, POSSIBLE OPEN, GASTROPEXY;  Surgeon: Erroll Luna, MD;  Location: [place];  Service: General;  Laterality: N/A;  . LOOP RECORDER INSERTION N/A 03/11/2017   Procedure: Loop Recorder Insertion;  Surgeon: Evans Lance, MD;  Location: [facility];  Service: Cardiovascular;  Laterality: N/A;  . NEUROPLASTY / TRANSPOSITION MEDIAN NERVE AT CARPAL TUNNEL    . PERIPHERAL VASCULAR BALLOON ANGIOPLASTY Left 09/11/2019   Procedure: PERIPHERAL VASCULAR BALLOON ANGIOPLASTY;  Surgeon: Waynetta Sandy, MD;  Location: [facility];  Service: Cardiovascular;  Laterality: Left;  TP trunk  . PERIPHERAL VASCULAR CATHETERIZATION Right 07/06/2016   Procedure: Lower Extremity Angiography;  Surgeon: Angelia Mould, MD;  Location: [facility];  Service: Cardiovascular;  Laterality: Right;  . PERIPHERAL VASCULAR CATHETERIZATION Right 07/06/2016   Procedure: Peripheral Vascular Intervention;  Surgeon: Angelia Mould, MD;  Location: [facility];  Service: Cardiovascular;  Laterality: Right;  Superficial femoral  . PERIPHERAL VASCULAR INTERVENTION Left 09/11/2019   Procedure: PERIPHERAL VASCULAR INTERVENTION;  Surgeon: Waynetta Sandy, MD;  Location: [facility];  Service: Cardiovascular;  Laterality: Left;  SFA  . skin lesion excision cheek/face    . TEE WITHOUT CARDIOVERSION N/A 03/11/2017   Procedure: TRANSESOPHAGEAL ECHOCARDIOGRAM (TEE);  Surgeon: Acie Fredrickson Wonda Cheng, MD;  Location: [facility];  Service: Cardiovascular;  Laterality: N/A;  . TOTAL HIP ARTHROPLASTY    . TUBAL LIGATION      Current Outpatient Medications    Medication Sig Dispense Refill  . amiodarone (PACERONE) 200 MG tablet Take 1 tablet (200 mg total) by mouth daily. 30 tablet 0  . clopidogrel (PLAVIX) 75 MG tablet Take 75 mg by mouth daily.    Marland Kitchen ELIQUIS 5 MG TABS tablet TAKE 1 TABLET BY MOUTH TWO  TIMES DAILY (Patient taking differently: Take 5 mg by mouth 2 (two) times daily. ) 60 tablet 0  . escitalopram (LEXAPRO) 20 MG tablet Take 20 mg by mouth daily.    Marland Kitchen latanoprost (XALATAN) 0.005 % ophthalmic solution Place 1  drop into both eyes at bedtime.    Marland Kitchen levothyroxine (SYNTHROID, LEVOTHROID) 100 MCG tablet Take 100 mcg by mouth daily before breakfast.     . LORazepam (ATIVAN) 0.5 MG tablet Take 0.5 mg by mouth 2 (two) times daily. 12pm and bedtime    . Multiple Vitamin (MULTIVITAMIN WITH MINERALS) TABS tablet Take 1 tablet by mouth daily.    . pantoprazole (PROTONIX) 40 MG tablet Take 40 mg by mouth daily.    . divalproex (DEPAKOTE SPRINKLE) 125 MG capsule Take 1 capsule (125 mg total) by mouth 2 (two) times daily. 60 capsule 3   No current facility-administered medications for this visit.    Allergies as of 05/16/2020 - Review Complete 05/16/2020  Allergen Reaction Noted  . Aspirin Other (See Comments) 06/30/2016  . Atorvastatin Other (See Comments) 11/12/2015  . Nabumetone Itching 11/12/2015  . Nsaids Other (See Comments) 06/30/2016  . Pravastatin Other (See Comments) 05/24/2017  . Procaine Swelling 08/29/2014  . Rosuvastatin Nausea Only and Other (See Comments) 11/12/2015  . Simvastatin Nausea Only and Other (See Comments) 11/12/2015  . Statins Other (See Comments) 04/05/2017  . Evolocumab Rash 03/30/2018  . Mobic [meloxicam] Rash 06/30/2016  . Penicillins Rash 06/30/2016    Vitals: BP (!) 139/77 (BP Location: Left Arm, Patient Position: Sitting)   Pulse 80   Ht 5\' 4"  (1.626 m)   Wt 147 lb (66.7 kg)   BMI 25.23 kg/m  Last Weight:  Wt Readings from Last 1 Encounters:  05/16/20 147 lb (66.7 kg)   Last Height:   Ht  Readings from Last 1 Encounters:  05/16/20 5\' 4"  (1.626 m)     Physical exam: Exam: Gen: NAD, thin and frail appearing                CV: RRR, +SEM. No Carotid Bruits. No peripheral edema, warm, nontender Eyes: Conjunctivae clear without exudates or hemorrhage  Neuro: Detailed Neurologic Exam  Speech:    Speech is normal; fluent with impaired comprehension.  Cognition:     MMSE - Mini Mental State Exam 05/16/2020  Orientation to time 1  Orientation to Place 3  Registration 3  Attention/ Calculation 0  Recall 0  Language- name 2 objects 2  Language- repeat 0  Language- follow 3 step command 3  Language- read & follow direction 1  Write a sentence 1  Copy design 0  Total score 14    Cranial Nerves:    The pupils are equal, round, and reactive to light. Attempted fundoscopic exam could not visualize due to small pupils. Visual fields are full to threat bilaterally. Extraocular movements are intact. Trigeminal sensation is intact. The face is symmetric. The palate elevates in the midline. Hearing impaired to voice. Voice is hypophonic. Shoulder shrug is normal. The tongue is midline has normal motion without fasciculations.   Coordination:    No dysmetria but difficult exam due to joint pain  Gait: attempted, knee pain impairing    Motor Observation:    No asymmetry, no atrophy, and no involuntary movements noted. Tone:    Normal tone left arm, she declines right arm due to pain  Posture:    Posture is stooped    Strength:    All limbs are anti-gravity and eval no deficits noted but cannot perform thorough exam due to pain.      Sensation: intact to LT     Reflex Exam:  DTR's:    Declined patellars due to pain. Absent AJs. Biceps 2+  Toes:    The toes are equiv bilaterally.   Clonus:    Clonus is absent.    Assessment/Plan:  86 y.o. female here as requested by Alyssa Loveless, PA for memory impairment. PMHx  . I reviewed Alyssa Roan, PA's notes: Past  medical history hypertension, peripheral artery disease, left PCA stroke, chronic venous insufficiency, paroxysmal atrial fib, migraine headache, chronic low back pain with sciatica, hearing loss, high cholesterol, obstructive sleep apnea, obesity, bilateral leg edema, spinal stenosis of the lumbar region with neurogenic claudication, depression, malnutrition, generalized weakness.   -This is a lovely 86 year old with extensive chronic microvascular ischemic changes and MRI of the brain and prior strokes.  Most likely diagnosis is vascular dementia with behavioral changes.  She is having some agitation, she was on an extremely high dose of Depakote 375 mg 3 times daily and it was giving her side effects and that was discontinued.  However for her agitation I do like Depakote and I think it is one of the safer drugs if compared to the benzodiazepines and antipsychotics so we will start her back at a very low dose and keep it low, start her on Depakote 125 mg twice daily and slowly increase it but not to the dose she was on prior.  -For her depression she can continue the Lexapro if helping helping as prescribed by other physician, she has Ativan twice a day prescribed by another physician.  -We discussed dementia types, likely vascular dementia, I provided literature on chronic microvascular changes, this can be causing her falls as well or at least be contributing to it along with her multifocal arthritis especially in the knees, she is already been to PT recently and completed it within the last month.  At this time I would advise her to always be monitored, and never walk without a walking aid or without someone with her.  - Today's history and physical demonstrated very substantial and measurable cognitive losses consistent with dementia. Based on the  degree of impairment it is clear that she does not have the capacity to make informed and appropriate decisions on her healthcare or finances. I do  recommend that she lives in a structured setting and neds 24x7 care and monitoring by professional caretakers. Her son continues to look out for the best interests of patient who is suffering from substantial cognitive impairment due to dementia.  - Continue current medications for secondary stroke prevention and maintain strict control of hypertension with blood pressure goal below 130/90, diabetes with hemoglobin A1c goal below 6.5% and lipids with LDL cholesterol goal below 70 mg/dL I also advised the patient to eat a healthy diet with plenty of whole grains, cereals, fruits and vegetables, exercise as tolerates and maintain ideal body weight .Followup with pcp for management of vascular risk factors  - we will see her back in 6 months and at that time we can release her back to pcp as appropriate  - Discussed vascular dementia and provided much literature on this and managing behaviral changes in the elderly    Meds ordered this encounter  Medications  . divalproex (DEPAKOTE SPRINKLE) 125 MG capsule    Sig: Take 1 capsule (125 mg total) by mouth 2 (two) times daily.    Dispense:  60 capsule    Refill:  3    Do not fill at this time    Cc: Alyssa Leach, MD,  Alyssa Leach, MD, Alyssa Roan, PA   Sarina Ill, MD  [facility]  [address] [place], [place] [address]  Phone [phone] Fax [phone]  I spent 60 minutes of face-to-face and non-face-to-face time with patient on the  1. Vascular dementia with behavioral disturbance (HCC)   2. Gait abnormality   3. Fall, initial encounter    diagnosis.  This included previsit chart review, lab review, study review, order entry, electronic health record documentation, patient education on the different diagnostic and therapeutic options, counseling and coordination of care, risks and benefits of management, compliance, or risk factor reduction

## 2020-05-16 ENCOUNTER — Encounter: Payer: Self-pay | Admitting: Neurology

## 2020-05-16 ENCOUNTER — Ambulatory Visit: Payer: Medicare Other | Admitting: Neurology

## 2020-05-16 VITALS — BP 139/77 | HR 80 | Ht 64.0 in | Wt 147.0 lb

## 2020-05-16 DIAGNOSIS — R269 Unspecified abnormalities of gait and mobility: Secondary | ICD-10-CM | POA: Diagnosis not present

## 2020-05-16 DIAGNOSIS — F01518 Vascular dementia, unspecified severity, with other behavioral disturbance: Secondary | ICD-10-CM

## 2020-05-16 DIAGNOSIS — W19XXXA Unspecified fall, initial encounter: Secondary | ICD-10-CM

## 2020-05-16 DIAGNOSIS — F0151 Vascular dementia with behavioral disturbance: Secondary | ICD-10-CM | POA: Diagnosis not present

## 2020-05-16 MED ORDER — DIVALPROEX SODIUM 125 MG PO CSDR: 125.0000 mg | DELAYED_RELEASE_CAPSULE | Freq: Two times a day (BID) | ORAL | 3 refills | Status: DC

## 2020-05-16 NOTE — Patient Instructions (Addendum)
Depakote sprinkles 125mg  twice daily  Vascular Dementia Dementia is a condition in which a person has problems with thinking, memory, and behavior that are severe enough to interfere with daily life. Vascular dementia is a type of dementia. It results from brain damage that is caused by the brain not getting enough blood. This condition may also be called vascular cognitive impairment. What are the causes? Vascular dementia is caused by conditions that lessen blood flow to the brain. Common causes of this condition include:  Multiple small strokes. These may happen without symptoms (silent stroke).  Major stroke.  Damage to small blood vessels in the brain (cerebral small vessel disease). What increases the risk? The following factors may make you more likely to develop this condition:  Having had a stroke.  Having high blood pressure (hypertension) or high cholesterol.  Having a disease that affects the heart or blood vessels.  Smoking.  Having diabetes.  Having metabolic syndrome.  Being obese.  Not being active.  Having depression.  Being over age 62. What are the signs or symptoms? Symptoms can vary from one person to another. Symptoms may be mild or severe depending on the amount of damage and which parts of the brain have been affected. Symptoms may begin suddenly or may develop slowly. Mental symptoms of vascular dementia may include:  Confusion.  Memory problems.  Poor attention and concentration.  Trouble understanding speech.  Depression.  Personality changes.  Trouble recognizing familiar people.  Agitation or aggression.  Paranoia.  Delusions or hallucinations. Physical symptoms of vascular dementia may include:  Weakness.  Poor balance.  Loss of bladder or bowel control (incontinence).  Unsteady walking (gait).  Speaking problems. Behavioral symptoms of vascular dementia may include:  Getting lost in familiar places.  Problems with  planning and judgment.  Trouble following instructions.  Social problems.  Emotional outbursts.  Trouble with daily activities and self-care.  Problems handling money. Symptoms may remain stable, or they may get worse over time. Symptoms of vascular dementia may be similar to those of Alzheimer's disease. The two conditions can occur together (mixed dementia). How is this diagnosed? Your health care provider will consider your medical history and symptoms or changes that are reported by friends and family. Your health care provider will do a physical exam and may order lab tests or other tests that check brain and nervous system function. Tests that may be done include:  Blood tests.  Brain imaging tests.  Tests of movement, speech, and other daily activities (neurological exam).  Tests of memory, thinking, and problem-solving (neuropsychological or neurocognitive testing). There is not a specific test to diagnose vascular dementia. Diagnosis may involve several specialists. These may include:  A health care provider who specializes in the brain and nervous system (neurologist).  A health provider who specializes in understanding how problems in the brain can alter behavior and cognitive function (neuropsychologist). How is this treated? There is no cure for vascular dementia. Brain damage that has already occurred cannot be reversed. Treatment depends on:  How severe the condition is.  Which parts of your brain have been affected.  Your overall health. Treatment measures aim to:  Treat the underlying cause of vascular dementia and manage risk factors. This may include: ? Controlling blood pressure. ? Lowering cholesterol. ? Treating diabetes. ? Quitting smoking. ? Losing weight or maintaining a healthy weight. ? Eating a healthy, balanced diet. ? Getting regular exercise.  Manage symptoms.  Prevent further brain damage.  Improve the person's  health and quality of  life. Treatment for dementia may involve a team of health care providers, including:  A neurologist.  A provider who specializes in disorders of the mind (psychiatrist).  A provider who specializes in helping people learn daily living skills (occupational therapist).  A provider who focuses on speech and language changes (Electrical engineer).  A heart specialist (cardiologist).  A provider who helps people learn how to manage physical changes, such as movement and walking (exercise physiologist or physical therapist). Follow these instructions at home: Lifestyle  People with vascular dementia may need regular help at home or daily care from a family member or home health care worker. Home care for a person with vascular dementia depends on what caused the condition and how severe the symptoms are. General guidelines for caregivers include:  Help the person with dementia remember people, appointments, and daily activities.  Help the person with dementia manage his or her medicines.  Help family and friends learn about ways to communicate with the person with dementia.  Create a safe living space to reduce the risk of injury or falls.  Find a support group to help caregivers and family cope with the effects of dementia.  General instructions  Help the person take over-the-counter and prescription medicines only as told by the health care provider.  Follow the health care provider's instructions for treating the condition that caused the dementia.  Make sure the person keeps all follow-up visits as told by the health care provider. This is important. Contact a health care provider if:  A fever develops.  New behavioral problems develop.  Problems with swallowing develop.  Confusion gets worse.  Sleepiness gets worse. Get help right away if:  Loss of consciousness occurs.  There is a sudden loss of speech, balance, or thinking ability.  New numbness or paralysis  occurs.  Sudden, severe headache occurs.  Vision is lost or suddenly gets worse in one or both eyes. Summary  Vascular dementia is a type of dementia. It results from brain damage that is caused by the brain not getting enough blood.  Vascular dementia is caused by conditions that lessen blood flow to the brain. Common causes of this condition include stroke and damage to small blood vessels in the brain.  Treatment focuses on treating the underlying cause of vascular dementia and managing any risk factors.  People with vascular dementia may need regular help at home or daily care from a family member or home health care worker.  Contact a health care provider if you or your caregiver notice any new symptoms. This information is not intended to replace advice given to you by your health care provider. Make sure you discuss any questions you have with your health care provider. Document Revised: 07/06/2018 Document Reviewed: 07/07/2018 Else[employer] Patient Education  [employer].   Valproic Acid, Divalproex Sodium sprinkle capsule What is this medicine? DIVALPROEX SODIUM (dye VAL pro ex SO dee um) is used to treat certain types of seizures in patients with epilepsy. This medicine may be used for other purposes; ask your health care provider or pharmacist if you have questions. COMMON BRAND NAME(S): Depakote What should I tell my health care provider before I take this medicine? They need to know if you have any of these conditions:  if you often drink alcohol  kidney disease  liver disease  low platelet counts  mitochondrial disease  suicidal thoughts, plans, or attempt; a previous suicide attempt by you or a family  member  urea cycle disorder (UCD)  an unusual or allergic reaction to divalproex sodium, sodium valproate, valproic acid, other medicines, foods, dyes, or preservatives  pregnant or trying to get pregnant  breast-feeding How should I use this  medicine? Take this medicine by mouth. It can be swallowed whole or the capsules may be opened carefully and the contents sprinkled on about a teaspoonful of applesauce or pudding. This mixture must be swallowed immediately. Do not cut, crush or chew this medicine. You can take it with or without food. If it upsets your stomach, take it with food. Follow the directions on the prescription label. Take your medicine at regular intervals. Do not take it more often than directed. Do not stop taking except on your doctor's advice. A special MedGuide will be given to you by the pharmacist with each prescription and refill. Be sure to read this information carefully each time. Talk to your pediatrician regarding the use of this medicine in children. While this drug may be prescribed for children as young as 10 years for selected conditions, precautions do apply. Overdosage: If you think you have taken too much of this medicine contact a poison control center or emergency room at once. NOTE: This medicine is only for you. Do not share this medicine with others. What if I miss a dose? If you miss a dose, take it as soon as you can. If it is almost time for your next dose, take only that dose. Do not take double or extra doses. What may interact with this medicine? Do not take this medicine with any of the following medications:  sodium phenylbutyrate This medicine may also interact with the following medications:  aspirin  certain antibiotics like ertapenem, imipenem, meropenem  certain medicines for depression, anxiety, or psychotic disturbances  certain medicines for seizures like carbamazepine, clonazepam, diazepam, ethosuximide, felbamate, lamotrigine, phenobarbital, phenytoin, primidone, rufinamide, topiramate  certain medicines that treat or prevent blood clots like warfarin  cholestyramine  female hormones, like estrogens and birth control pills, patches, or  rings  propofol  rifampin  ritonavir  tolbutamide  zidovudine This list may not describe all possible interactions. Give your health care provider a list of all the medicines, herbs, non-prescription drugs, or dietary supplements you use. Also tell them if you smoke, drink alcohol, or use illegal drugs. Some items may interact with your medicine. What should I watch for while using this medicine? Tell your doctor or health care provider if your symptoms do not get better or they start to get worse. This medicine may cause serious skin reactions. They can happen weeks to months after starting the medicine. Contact your health care provider right away if you notice fevers or flu-like symptoms with a rash. The rash may be red or purple and then turn into blisters or peeling of the skin. Or, you might notice a red rash with swelling of the face, lips or lymph nodes in your neck or under your arms. Wear a medical ID bracelet or chain, and carry a card that describes your disease and details of your medicine and dosage times. You may get drowsy, dizzy, or have blurred vision. Do not drive, use machinery, or do anything that needs mental alertness until you know how this medicine affects you. To reduce dizzy or fainting spells, do not sit or stand up quickly, especially if you are an older patient. Alcohol can increase drowsiness and dizziness. Avoid alcoholic drinks. This medicine can make you more sensitive to the  sun. Keep out of the sun. If you cannot avoid being in the sun, wear protective clothing and use sunscreen. Do not use sun lamps or tanning beds/booths. Patients and their families should watch out for new or worsening depression or thoughts of suicide. Also watch out for sudden changes in feelings such as feeling anxious, agitated, panicky, irritable, hostile, aggressive, impulsive, severely restless, overly excited and hyperactive, or not being able to sleep. If this happens, especially at  the beginning of treatment or after a change in dose, call your health care provider. Women should inform their doctor if they wish to become pregnant or think they might be pregnant. There is a potential for serious side effects to an unborn child. Talk to your health care provider or pharmacist for more information. Women who become pregnant while using this medicine may enroll in the [place] Pregnancy Registry by calling [id number]. This registry collects information about the safety of antiepileptic drug use during pregnancy. This medicine may cause a decrease in folic acid and vitamin D. You should make sure that you get enough vitamins while you are taking this medicine. Discuss the foods you eat and the vitamins you take with your health care provider. What side effects may I notice from receiving this medicine? Side effects that you should report to your doctor or health care professional as soon as possible:  allergic reactions like skin rash, itching or hives, swelling of the face, lips, or tongue  changes in vision  rash, fever, and swollen lymph nodes  redness, blistering, peeling or loosening of the skin, including inside the mouth  signs and symptoms of liver injury like dark yellow or brown urine; general ill feeling or flu-like symptoms; light-colored stools; loss of appetite; nausea; right upper belly pain; unusually weak or tired; yellowing of the eyes or skin  suicidal thoughts or other mood changes  unusual bleeding or bruising Side effects that usually do not require medical attention (report to your doctor or health care professional if they continue or are bothersome):  constipation  diarrhea  dizziness  hair loss  headache  loss of appetite  weight gain This list may not describe all possible side effects. Call your doctor for medical advice about side effects. You may report side effects to FDA at 1-800-FDA-1088. Where should I  keep my medicine? Keep out of reach of children. Store at room temperature below 25 degrees C (77 degrees F). Keep container tightly closed. Throw away any unused medicine after the expiration date. NOTE: This sheet is a summary. It may not cover all possible information. If you have questions about this medicine, talk to your doctor, pharmacist, or health care provider.  2020 Elsevier/Gold Standard (2019-01-13 16:10:00)

## 2020-05-19 ENCOUNTER — Emergency Department (HOSPITAL_COMMUNITY): Payer: Medicare Other

## 2020-05-19 ENCOUNTER — Emergency Department (HOSPITAL_COMMUNITY)
Admission: EM | Admit: 2020-05-19 | Discharge: 2020-05-19 | Disposition: A | Discharge status: Home / Self Care | Payer: Medicare Other | Attending: Emergency Medicine | Admitting: Emergency Medicine

## 2020-05-19 DIAGNOSIS — S02402B Zygomatic fracture, unspecified, initial encounter for open fracture: Secondary | ICD-10-CM | POA: Diagnosis not present

## 2020-05-19 DIAGNOSIS — W19XXXA Unspecified fall, initial encounter: Secondary | ICD-10-CM | POA: Insufficient documentation

## 2020-05-19 DIAGNOSIS — Y939 Activity, unspecified: Secondary | ICD-10-CM | POA: Diagnosis not present

## 2020-05-19 DIAGNOSIS — S0990XA Unspecified injury of head, initial encounter: Secondary | ICD-10-CM | POA: Insufficient documentation

## 2020-05-19 DIAGNOSIS — Y999 Unspecified external cause status: Secondary | ICD-10-CM | POA: Insufficient documentation

## 2020-05-19 DIAGNOSIS — Y929 Unspecified place or not applicable: Secondary | ICD-10-CM | POA: Insufficient documentation

## 2020-05-19 DIAGNOSIS — S0240EB Zygomatic fracture, right side, initial encounter for open fracture: Secondary | ICD-10-CM | POA: Insufficient documentation

## 2020-05-19 DIAGNOSIS — S0993XA Unspecified injury of face, initial encounter: Secondary | ICD-10-CM | POA: Diagnosis present

## 2020-05-19 LAB — CBC
HCT: 38.2 % (ref 36.0–46.0)
Hemoglobin: 12.1 g/dL (ref 12.0–15.0)
MCH: 29.8 pg (ref 26.0–34.0)
MCHC: 31.7 g/dL (ref 30.0–36.0)
MCV: 94.1 fL (ref 80.0–100.0)
Platelets: 230 10*3/uL (ref 150–400)
RBC: 4.06 MIL/uL (ref 3.87–5.11)
RDW: 12.5 % (ref 11.5–15.5)
WBC: 9.3 10*3/uL (ref 4.0–10.5)
nRBC: 0 % (ref 0.0–0.2)

## 2020-05-19 LAB — COMPREHENSIVE METABOLIC PANEL
ALT: 25 U/L (ref 0–44)
AST: 20 U/L (ref 15–41)
Albumin: 3.3 g/dL — ABNORMAL LOW (ref 3.5–5.0)
Alkaline Phosphatase: 89 U/L (ref 38–126)
Anion gap: 11 (ref 5–15)
BUN: 21 mg/dL (ref 8–23)
CO2: 24 mmol/L (ref 22–32)
Calcium: 9.1 mg/dL (ref 8.9–10.3)
Chloride: 106 mmol/L (ref 98–111)
Creatinine, Ser: 1.32 mg/dL — ABNORMAL HIGH (ref 0.44–1.00)
GFR calc Af Amer: 43 mL/min — ABNORMAL LOW (ref >60)
GFR calc non Af Amer: 37 mL/min — ABNORMAL LOW (ref >60)
Glucose, Bld: 103 mg/dL — ABNORMAL HIGH (ref 70–99)
Potassium: 4.1 mmol/L (ref 3.5–5.1)
Sodium: 141 mmol/L (ref 135–145)
Total Bilirubin: 0.3 mg/dL (ref 0.3–1.2)
Total Protein: 6.4 g/dL — ABNORMAL LOW (ref 6.5–8.1)

## 2020-05-19 LAB — I-STAT CHEM 8, ED
BUN: 22 mg/dL (ref 8–23)
Calcium, Ion: 1.15 mmol/L (ref 1.15–1.40)
Chloride: 104 mmol/L (ref 98–111)
Creatinine, Ser: 1.3 mg/dL — ABNORMAL HIGH (ref 0.44–1.00)
Glucose, Bld: 96 mg/dL (ref 70–99)
HCT: 35 % — ABNORMAL LOW (ref 36.0–46.0)
Hemoglobin: 11.9 g/dL — ABNORMAL LOW (ref 12.0–15.0)
Potassium: 3.9 mmol/L (ref 3.5–5.1)
Sodium: 143 mmol/L (ref 135–145)
TCO2: 24 mmol/L (ref 22–32)

## 2020-05-19 LAB — PROTIME-INR
INR: 1 (ref 0.8–1.2)
Prothrombin Time: 12.4 seconds (ref 11.4–15.2)

## 2020-05-19 LAB — LACTIC ACID, PLASMA: Lactic Acid, Venous: 1.2 mmol/L (ref 0.5–1.9)

## 2020-05-19 MED ORDER — FLUORESCEIN SODIUM 1 MG OP STRP
1.0000 | ORAL_STRIP | Freq: Once | OPHTHALMIC | Status: AC
  Administered 2020-05-19: 1 via OPHTHALMIC
  Filled 2020-05-19: qty 1

## 2020-05-19 MED ORDER — SODIUM CHLORIDE 0.9 % IV BOLUS
500.0000 mL | Freq: Once | INTRAVENOUS | Status: AC
  Administered 2020-05-19: 500 mL via INTRAVENOUS

## 2020-05-19 MED ORDER — CEFAZOLIN SODIUM-DEXTROSE 2-4 GM/100ML-% IV SOLN
2.0000 g | Freq: Once | INTRAVENOUS | Status: AC
  Administered 2020-05-19: 2 g via INTRAVENOUS
  Filled 2020-05-19: qty 100

## 2020-05-19 NOTE — Consult Note (Signed)
Reason for Consult: Facial fracture Referring Physician: ER attending  Alyssa Odonnell is an 86 y.o. female who fell earlier today.  She currently complains of right-sided facial pain and chest pain related to rib fractures.  She denies any malocclusion or double vision.  She has no trismus.  Patient is a former Therapist, sports.  No past medical history on file.  No family history on file.  Social History:  has no history on file for tobacco use, alcohol use, and drug use.  Allergies: Not on File  Medications: I have reviewed the patient's current medications.  Results for orders placed or performed during the hospital encounter of 05/19/20 (from the past 48 hour(s))  Comprehensive metabolic panel     Status: Abnormal   Collection Time: 05/19/20  7:35 PM  Result Value Ref Range   Sodium 141 135 - 145 mmol/L   Potassium 4.1 3.5 - 5.1 mmol/L   Chloride 106 98 - 111 mmol/L   CO2 24 22 - 32 mmol/L   Glucose, Bld 103 (H) 70 - 99 mg/dL    Comment: Glucose reference range applies only to samples taken after fasting for at least 8 hours.   BUN 21 8 - 23 mg/dL   Creatinine, Ser 1.32 (H) 0.44 - 1.00 mg/dL   Calcium 9.1 8.9 - 10.3 mg/dL   Total Protein 6.4 (L) 6.5 - 8.1 g/dL   Albumin 3.3 (L) 3.5 - 5.0 g/dL   AST 20 15 - 41 U/L   ALT 25 0 - 44 U/L   Alkaline Phosphatase 89 38 - 126 U/L   Total Bilirubin 0.3 0.3 - 1.2 mg/dL   GFR calc non Af Amer 37 (L) >60 mL/min   GFR calc Af Amer 43 (L) >60 mL/min   Anion gap 11 5 - 15    Comment: Performed at [facility] [address], [place] [address]  CBC     Status: None   Collection Time: 05/19/20  7:35 PM  Result Value Ref Range   WBC 9.3 4.0 - 10.5 K/uL   RBC 4.06 3.87 - 5.11 MIL/uL   Hemoglobin 12.1 12.0 - 15.0 g/dL   HCT 38.2 36 - 46 %   MCV 94.1 80.0 - 100.0 fL   MCH 29.8 26.0 - 34.0 pg   MCHC 31.7 30.0 - 36.0 g/dL   RDW 12.5 11.5 - 15.5 %   Platelets 230 150 - 400 K/uL   nRBC 0.0 0.0 - 0.2 %    Comment: Performed at [facility], [place] [address] [place], [place] [address]  Lactic acid, plasma     Status: None   Collection Time: 05/19/20  7:35 PM  Result Value Ref Range   Lactic Acid, Venous 1.2 0.5 - 1.9 mmol/L    Comment: Performed at [facility] [address], [place], [place] [address]  Protime-INR     Status: None   Collection Time: 05/19/20  7:35 PM  Result Value Ref Range   Prothrombin Time 12.4 11.4 - 15.2 seconds   INR 1.0 0.8 - 1.2    Comment: (NOTE) INR goal varies based on device and disease states. Performed at [facility], [place] [address], [place], [place] [address]   I-Stat Chem 8, ED     Status: Abnormal   Collection Time: 05/19/20  8:32 PM  Result Value Ref Range   Sodium 143 135 - 145 mmol/L   Potassium 3.9 3.5 - 5.1  mmol/L   Chloride 104 98 - 111 mmol/L   BUN 22 8 - 23 mg/dL   Creatinine, Ser 1.30 (H) 0.44 - 1.00 mg/dL   Glucose, Bld 96 70 - 99 mg/dL    Comment: Glucose reference range applies only to samples taken after fasting for at least 8 hours.   Calcium, Ion 1.15 1.15 - 1.40 mmol/L   TCO2 24 22 - 32 mmol/L   Hemoglobin 11.9 (L) 12.0 - 15.0 g/dL   HCT 35.0 (L) 36 - 46 %    CT HEAD WO CONTRAST  Result Date: 05/19/2020 CLINICAL DATA:  Head trauma, moderate/severe, fall. Facial trauma. Neck trauma. Patient on Eliquis. EXAM: CT HEAD WITHOUT CONTRAST CT MAXILLOFACIAL WITHOUT CONTRAST CT CERVICAL SPINE WITHOUT CONTRAST TECHNIQUE: Multidetector CT imaging of the head, cervical spine, and maxillofacial structures were performed using the standard protocol without intravenous contrast. Multiplanar CT image reconstructions of the cervical spine and maxillofacial structures were also generated. COMPARISON:  Brain MRI 02/13/2020, head CT 02/13/2020 FINDINGS: CT HEAD FINDINGS Brain: Stable, mild generalized parenchymal atrophy. 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 Stable, moderate/advanced patchy and confluent hypoattenuation  within the cerebral white matter which is nonspecific, but consistent with chronic small vessel ischemic disease. Redemonstrated chronic lacunar infarct within the right lentiform nucleus. There is no acute intracranial hemorrhage. No acute demarcated cortical infarct is identified. No extra-axial fluid collection. No evidence of intracranial mass. No midline shift. Vascular: No hyperdense vessel.  Atherosclerotic calcifications. Skull: Normal. Negative for fracture or focal lesion. CT MAXILLOFACIAL FINDINGS Osseous: There are comminuted, multi-segment acute fractures of the right zygomatic arch. There is medial displacement of the posterior-most fracture fragment. Orbits: No acute abnormality. The globes are normal in size and contour. The extra-ocular muscles and optic nerve sheath complexes are symmetric and unremarkable. Sinuses: No significant paranasal sinus disease or mastoid effusion. Soft tissues: There is prominent right periorbital and right maxillofacial soft tissue swelling. 1.9 cm rounded hematoma within the right maxillofacial soft tissues overlying the anterior aspect of the right zygomatic arch (series 7, image 28). There is also scattered subcutaneous gas within the right maxillofacial soft tissues. CT CERVICAL SPINE FINDINGS Alignment: Mild C4-C5 and C6-C7 grade 1 anterolisthesis. Skull base and vertebrae: The basion-dental and atlanto-dental intervals are maintained.No evidence of acute fracture to the cervical spine. Soft tissues and spinal canal: No prevertebral fluid or swelling. No visible canal hematoma. Disc levels: Cervical spondylosis with multilevel disc space narrowing, uncovertebral and facet hypertrophy. Suspected early osseous fusion across the disc space at C5-C6 with a posterior osteophyte and uncovertebral hypertrophy at this level. Additionally, there is osseous fusion of the facet joints on the left at C2-C3 and on the right at C3-C4. Upper chest: No consolidation within the  imaged lung apices. No visible pneumothorax. Other: Calcified atherosclerotic plaque within the carotid arteries bilaterally. IMPRESSION: CT head: 1. No evidence of acute intracranial abnormality. 2. Redemonstrated chronic cortically based left parietooccipital infarct. 3. Stable moderate/advanced chronic small vessel ischemic changes within the cerebral white matter. 4. Redemonstrated chronic right basal ganglia lacunar infarct. 5. Stable, mild generalized parenchymal atrophy. CT maxillofacial: 1. Acute, comminuted, multi-segment fracture of the right zygomatic arch. The posterior most fracture fragment is medially displaced. 2. Prominent right maxillofacial/periorbital soft tissue swelling. Associated 1.9 cm rounded hematoma within the right maxillofacial soft tissues. Scattered subcutaneous gas is also present CT cervical spine: 1. No evidence of acute fracture to the cervical spine. 2. Cervical spondylosis as described with multilevel degenerative fusion. Electronically Signed  By: Kellie Simmering DO   On: 05/19/2020 20:18   CT CERVICAL SPINE WO CONTRAST  Result Date: 05/19/2020 CLINICAL DATA:  Head trauma, moderate/severe, fall. Facial trauma. Neck trauma. Patient on Eliquis. EXAM: CT HEAD WITHOUT CONTRAST CT MAXILLOFACIAL WITHOUT CONTRAST CT CERVICAL SPINE WITHOUT CONTRAST TECHNIQUE: Multidetector CT imaging of the head, cervical spine, and maxillofacial structures were performed using the standard protocol without intravenous contrast. Multiplanar CT image reconstructions of the cervical spine and maxillofacial structures were also generated. COMPARISON:  Brain MRI 02/13/2020, head CT 02/13/2020 FINDINGS: CT HEAD FINDINGS Brain: Stable, mild generalized parenchymal atrophy. 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 Stable, moderate/advanced patchy and confluent hypoattenuation within the cerebral white matter which is nonspecific, but consistent with chronic small vessel  ischemic disease. Redemonstrated chronic lacunar infarct within the right lentiform nucleus. There is no acute intracranial hemorrhage. No acute demarcated cortical infarct is identified. No extra-axial fluid collection. No evidence of intracranial mass. No midline shift. Vascular: No hyperdense vessel.  Atherosclerotic calcifications. Skull: Normal. Negative for fracture or focal lesion. CT MAXILLOFACIAL FINDINGS Osseous: There are comminuted, multi-segment acute fractures of the right zygomatic arch. There is medial displacement of the posterior-most fracture fragment. Orbits: No acute abnormality. The globes are normal in size and contour. The extra-ocular muscles and optic nerve sheath complexes are symmetric and unremarkable. Sinuses: No significant paranasal sinus disease or mastoid effusion. Soft tissues: There is prominent right periorbital and right maxillofacial soft tissue swelling. 1.9 cm rounded hematoma within the right maxillofacial soft tissues overlying the anterior aspect of the right zygomatic arch (series 7, image 28). There is also scattered subcutaneous gas within the right maxillofacial soft tissues. CT CERVICAL SPINE FINDINGS Alignment: Mild C4-C5 and C6-C7 grade 1 anterolisthesis. Skull base and vertebrae: The basion-dental and atlanto-dental intervals are maintained.No evidence of acute fracture to the cervical spine. Soft tissues and spinal canal: No prevertebral fluid or swelling. No visible canal hematoma. Disc levels: Cervical spondylosis with multilevel disc space narrowing, uncovertebral and facet hypertrophy. Suspected early osseous fusion across the disc space at C5-C6 with a posterior osteophyte and uncovertebral hypertrophy at this level. Additionally, there is osseous fusion of the facet joints on the left at C2-C3 and on the right at C3-C4. Upper chest: No consolidation within the imaged lung apices. No visible pneumothorax. Other: Calcified atherosclerotic plaque within the  carotid arteries bilaterally. IMPRESSION: CT head: 1. No evidence of acute intracranial abnormality. 2. Redemonstrated chronic cortically based left parietooccipital infarct. 3. Stable moderate/advanced chronic small vessel ischemic changes within the cerebral white matter. 4. Redemonstrated chronic right basal ganglia lacunar infarct. 5. Stable, mild generalized parenchymal atrophy. CT maxillofacial: 1. Acute, comminuted, multi-segment fracture of the right zygomatic arch. The posterior most fracture fragment is medially displaced. 2. Prominent right maxillofacial/periorbital soft tissue swelling. Associated 1.9 cm rounded hematoma within the right maxillofacial soft tissues. Scattered subcutaneous gas is also present CT cervical spine: 1. No evidence of acute fracture to the cervical spine. 2. Cervical spondylosis as described with multilevel degenerative fusion. Electronically Signed   By: Kellie Simmering DO   On: 05/19/2020 20:18   DG Pelvis Portable  Result Date: 05/19/2020 CLINICAL DATA:  Fall earlier today. EXAM: PORTABLE PELVIS 1-2 VIEWS COMPARISON:  None. FINDINGS: Right hip arthroplasty in expected alignment. No evidence of periprosthetic or acute pelvic fracture. Advanced left hip osteoarthritis. The pubic rami are intact. Pubic symphysis and sacroiliac joints are congruent. Popcorn calcification in the pelvis is typical of phleboliths. Vascular stent in the left  leg. IMPRESSION: No acute pelvic fracture. Right hip arthroplasty in expected alignment. Electronically Signed   By: Keith Rake M.D.   On: 05/19/2020 19:55   DG Chest Port 1 View  Result Date: 05/19/2020 CLINICAL DATA:  Fall earlier today. EXAM: PORTABLE CHEST 1 VIEW COMPARISON:  Chest radiograph 02/13/2020.  Chest CT 09/30/2019 FINDINGS: Unchanged heart size and mediastinal contours. Right retrocardiac hiatal hernia obscures accurate assessment of the cardiac silhouette. Implanted loop recorder projects over the left chest wall. No  focal airspace disease, pneumothorax or large pleural effusion. Chronic change about both shoulders. No acute osseous abnormalities are seen. IMPRESSION: 1. No acute findings. 2. Large right retrocardiac hiatal hernia. Electronically Signed   By: Keith Rake M.D.   On: 05/19/2020 19:54   CT MAXILLOFACIAL WO CONTRAST  Result Date: 05/19/2020 CLINICAL DATA:  Head trauma, moderate/severe, fall. Facial trauma. Neck trauma. Patient on Eliquis. EXAM: CT HEAD WITHOUT CONTRAST CT MAXILLOFACIAL WITHOUT CONTRAST CT CERVICAL SPINE WITHOUT CONTRAST TECHNIQUE: Multidetector CT imaging of the head, cervical spine, and maxillofacial structures were performed using the standard protocol without intravenous contrast. Multiplanar CT image reconstructions of the cervical spine and maxillofacial structures were also generated. COMPARISON:  Brain MRI 02/13/2020, head CT 02/13/2020 FINDINGS: CT HEAD FINDINGS Brain: Stable, mild generalized parenchymal atrophy. 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 Stable, moderate/advanced patchy and confluent hypoattenuation within the cerebral white matter which is nonspecific, but consistent with chronic small vessel ischemic disease. Redemonstrated chronic lacunar infarct within the right lentiform nucleus. There is no acute intracranial hemorrhage. No acute demarcated cortical infarct is identified. No extra-axial fluid collection. No evidence of intracranial mass. No midline shift. Vascular: No hyperdense vessel.  Atherosclerotic calcifications. Skull: Normal. Negative for fracture or focal lesion. CT MAXILLOFACIAL FINDINGS Osseous: There are comminuted, multi-segment acute fractures of the right zygomatic arch. There is medial displacement of the posterior-most fracture fragment. Orbits: No acute abnormality. The globes are normal in size and contour. The extra-ocular muscles and optic nerve sheath complexes are symmetric and unremarkable. Sinuses: No  significant paranasal sinus disease or mastoid effusion. Soft tissues: There is prominent right periorbital and right maxillofacial soft tissue swelling. 1.9 cm rounded hematoma within the right maxillofacial soft tissues overlying the anterior aspect of the right zygomatic arch (series 7, image 28). There is also scattered subcutaneous gas within the right maxillofacial soft tissues. CT CERVICAL SPINE FINDINGS Alignment: Mild C4-C5 and C6-C7 grade 1 anterolisthesis. Skull base and vertebrae: The basion-dental and atlanto-dental intervals are maintained.No evidence of acute fracture to the cervical spine. Soft tissues and spinal canal: No prevertebral fluid or swelling. No visible canal hematoma. Disc levels: Cervical spondylosis with multilevel disc space narrowing, uncovertebral and facet hypertrophy. Suspected early osseous fusion across the disc space at C5-C6 with a posterior osteophyte and uncovertebral hypertrophy at this level. Additionally, there is osseous fusion of the facet joints on the left at C2-C3 and on the right at C3-C4. Upper chest: No consolidation within the imaged lung apices. No visible pneumothorax. Other: Calcified atherosclerotic plaque within the carotid arteries bilaterally. IMPRESSION: CT head: 1. No evidence of acute intracranial abnormality. 2. Redemonstrated chronic cortically based left parietooccipital infarct. 3. Stable moderate/advanced chronic small vessel ischemic changes within the cerebral white matter. 4. Redemonstrated chronic right basal ganglia lacunar infarct. 5. Stable, mild generalized parenchymal atrophy. CT maxillofacial: 1. Acute, comminuted, multi-segment fracture of the right zygomatic arch. The posterior most fracture fragment is medially displaced. 2. Prominent right maxillofacial/periorbital soft tissue swelling. Associated 1.9 cm  rounded hematoma within the right maxillofacial soft tissues. Scattered subcutaneous gas is also present CT cervical spine: 1. No  evidence of acute fracture to the cervical spine. 2. Cervical spondylosis as described with multilevel degenerative fusion. Electronically Signed   By: Kellie Simmering DO   On: 05/19/2020 20:18    Review of Systems  Negative except for HPI x 8 systems -reviewed in ER attending's note  Blood pressure (!) 149/81, pulse 76, temperature 98.6 F (37 C), temperature source Oral, resp. rate 22, SpO2 96 %. Physical Exam  Alert and oriented x3 no acute distress pupils equal round reactive light extraocular movements intact gaze conjugate [place] normal mastoids nontender Nasal without masses, polyps, or rhinorrhea.  Nasal bones stable. Oral cavity moist mucosa without lesions floor mouth soft No cervical adenopathy or salivary gland masses.  Trachea midline.  No neck trauma. Chest symmetric expansions bilaterally without use of accessory muscles Cranial nerves II through XII intact without focal motor or sensory deficits Examination of the bony face reveals no step-offs or instability.  The mandible is nontender.  The midface is stable.  There is significant swelling overlying the right zygomatic arch consistent with a known fracture.  There is a small nonbleeding 1.5 cm laceration which is nicely approximated   CT scan - I reviewed the CT scan films myself.  The patient has a minimally displaced right zygomatic arch fracture.  There is no associated ZMC fracture.  The remainder of the bones are unremarkable.   Assessment/Plan:  Zygomatic arch fracture, right  This patient is on platelet inhibitors.  The good news is she does not need any sort of surgical intervention for this arch fracture.  It is minimally displaced and should heal without any sort of reduction or intervention.  She has no trismus or malocclusion.  This should not result in a cosmetic deformity.  I would recommend a soft diet for 3 weeks.  She needs no follow-up with oral maxillofacial unless having trouble at that time or in the  interim.  Marcina Millard 05/19/2020, 8:57 PM

## 2020-05-19 NOTE — Progress Notes (Signed)
   05/19/20 1952  Clinical Encounter Type  Visited With Patient;Health care provider  Visit Type Initial;ED;Trauma   Chaplain responded to a trauma in the ED.  Chaplain checked in with EMS, who indicated that family is probably on the way to [place] unable to retrieve a phone number to call. Spiritual care services available as needed.   Jeri Lager, Chaplain

## 2020-05-19 NOTE — Discharge Instructions (Signed)
Your work-up today was overall reassuring.  I did show that you have a fracture to your right zygomatic arch which is a bone in your face.  Please make sure to eat a soft diet for 3 weeks.  Please follow-up with your primary care doctor.  Return to the ER if your symptoms worsen.

## 2020-05-19 NOTE — ED Triage Notes (Signed)
Pt BIB [facility] from home following a fall. Pt slipped on ramp, c/o R sided facial swelling, bruising, puncture wound noted, bleeding controlled, some neck pain. Pt on Eliquis. Pt denies dizziness, N/V, LOC. Hx stroke, dementia. Pt A&ox3, confused to date at baseline. VSS, NAD noted.

## 2020-05-19 NOTE — ED Provider Notes (Signed)
Care received from Dr. Ron Parker at shift change.  Please read his note for full HPI.  In short, 86 year old female on Eliquis who fell from standing.  CMP without any electrolyte abnormalities, slightly increased creatinine of 1.32, no previous lab values to compare this to.  CBC without leukocytosis, normal hemoglobin.  CT of the head, C-spine, pelvis, chest without any acute abnormalities.  She was noted to have comminuted fracture of the right zygomatic arch on her maxillofacial CT.  Patient was evaluated by Dr. Marcelline Deist with ENT, who does not recommend surgery.  Does not recommend outpatient follow-up.  He does not recommend any additional antibiotics other than the Ancef that she received here.  Patient with a small laceration to her right cheek, I personally evaluated this, it is superficial and did not need suturing.  Thoroughly cleaned out the wound, and applied Steri-Strips.  Patient also complained of feeling like she has sand in her eye, performed a fluorescein stain which did not show any signs of fluorescein uptake, no evidence of hyphema, no corneal abrasions, no foreign objects.  Encouraged the patient to eat a soft diet as recommended by Dr. Marcelline Deist for 3 weeks.  Encouraged follow-up with her PCP, patient and her son voiced understanding and agreeable to this plan.  All the patient's questions have been answered to her satisfaction, she voices understanding is agreeable to this plan.  At this stage in the ED course, the patient is medically screened and stable for discharge. Physical Exam  BP (!) 158/85   Pulse 69   Temp 98.6 F (37 C) (Oral)   Resp 12   SpO2 100%   Physical Exam Vitals and nursing note reviewed.  Constitutional:      General: She is not in acute distress.    Appearance: She is well-developed.     Comments: Alert and oriented x3  HENT:     Head: Normocephalic and atraumatic.     Comments: Patient with evidence of raccoon eyes to the right eye, EOMs intact.  No  evidence of hyphema.  Bruising to the right cheek with notable swelling.  Patient able to speak without difficulty. Eyes:     Conjunctiva/sclera: Conjunctivae normal.     Comments: Fluorescein stain with Woods lamp without evidence of fluorescein uptake, no visible abrasions, foreign objects, no evidence of hyphema.  Cardiovascular:     Rate and Rhythm: Normal rate and regular rhythm.     Pulses: Normal pulses.     Heart sounds: Normal heart sounds. No murmur heard.   Pulmonary:     Effort: Pulmonary effort is normal. No respiratory distress.     Breath sounds: Normal breath sounds.  Abdominal:     Palpations: Abdomen is soft.     Tenderness: There is no abdominal tenderness.  Musculoskeletal:     Cervical back: Neck supple.     Comments: Moving all 4 extremities without difficulty.  Gross sensations intact.  5/5 strength in upper and lower extremities.  No gross deformities.  Skin:    General: Skin is warm and dry.  Neurological:     Mental Status: She is alert.     ED Course/Procedures    Results for orders placed or performed during the hospital encounter of 05/19/20  Comprehensive metabolic panel  Result Value Ref Range   Sodium 141 135 - 145 mmol/L   Potassium 4.1 3.5 - 5.1 mmol/L   Chloride 106 98 - 111 mmol/L   CO2 24 22 - 32 mmol/L  Glucose, Bld 103 (H) 70 - 99 mg/dL   BUN 21 8 - 23 mg/dL   Creatinine, Ser 1.32 (H) 0.44 - 1.00 mg/dL   Calcium 9.1 8.9 - 10.3 mg/dL   Total Protein 6.4 (L) 6.5 - 8.1 g/dL   Albumin 3.3 (L) 3.5 - 5.0 g/dL   AST 20 15 - 41 U/L   ALT 25 0 - 44 U/L   Alkaline Phosphatase 89 38 - 126 U/L   Total Bilirubin 0.3 0.3 - 1.2 mg/dL   GFR calc non Af Amer 37 (L) >60 mL/min   GFR calc Af Amer 43 (L) >60 mL/min   Anion gap 11 5 - 15  CBC  Result Value Ref Range   WBC 9.3 4.0 - 10.5 K/uL   RBC 4.06 3.87 - 5.11 MIL/uL   Hemoglobin 12.1 12.0 - 15.0 g/dL   HCT 38.2 36 - 46 %   MCV 94.1 80.0 - 100.0 fL   MCH 29.8 26.0 - 34.0 pg   MCHC 31.7  30.0 - 36.0 g/dL   RDW 12.5 11.5 - 15.5 %   Platelets 230 150 - 400 K/uL   nRBC 0.0 0.0 - 0.2 %  Lactic acid, plasma  Result Value Ref Range   Lactic Acid, Venous 1.2 0.5 - 1.9 mmol/L  Protime-INR  Result Value Ref Range   Prothrombin Time 12.4 11.4 - 15.2 seconds   INR 1.0 0.8 - 1.2  I-Stat Chem 8, ED  Result Value Ref Range   Sodium 143 135 - 145 mmol/L   Potassium 3.9 3.5 - 5.1 mmol/L   Chloride 104 98 - 111 mmol/L   BUN 22 8 - 23 mg/dL   Creatinine, Ser 1.30 (H) 0.44 - 1.00 mg/dL   Glucose, Bld 96 70 - 99 mg/dL   Calcium, Ion 1.15 1.15 - 1.40 mmol/L   TCO2 24 22 - 32 mmol/L   Hemoglobin 11.9 (L) 12.0 - 15.0 g/dL   HCT 35.0 (L) 36 - 46 %   CT HEAD WO CONTRAST  Result Date: 05/19/2020 CLINICAL DATA:  Head trauma, moderate/severe, fall. Facial trauma. Neck trauma. Patient on Eliquis. EXAM: CT HEAD WITHOUT CONTRAST CT MAXILLOFACIAL WITHOUT CONTRAST CT CERVICAL SPINE WITHOUT CONTRAST TECHNIQUE: Multidetector CT imaging of the head, cervical spine, and maxillofacial structures were performed using the standard protocol without intravenous contrast. Multiplanar CT image reconstructions of the cervical spine and maxillofacial structures were also generated. COMPARISON:  Brain MRI 02/13/2020, head CT 02/13/2020 FINDINGS: CT HEAD FINDINGS Brain: Stable, mild generalized parenchymal atrophy. 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 Stable, moderate/advanced patchy and confluent hypoattenuation within the cerebral white matter which is nonspecific, but consistent with chronic small vessel ischemic disease. Redemonstrated chronic lacunar infarct within the right lentiform nucleus. There is no acute intracranial hemorrhage. No acute demarcated cortical infarct is identified. No extra-axial fluid collection. No evidence of intracranial mass. No midline shift. Vascular: No hyperdense vessel.  Atherosclerotic calcifications. Skull: Normal. Negative for fracture or focal  lesion. CT MAXILLOFACIAL FINDINGS Osseous: There are comminuted, multi-segment acute fractures of the right zygomatic arch. There is medial displacement of the posterior-most fracture fragment. Orbits: No acute abnormality. The globes are normal in size and contour. The extra-ocular muscles and optic nerve sheath complexes are symmetric and unremarkable. Sinuses: No significant paranasal sinus disease or mastoid effusion. Soft tissues: There is prominent right periorbital and right maxillofacial soft tissue swelling. 1.9 cm rounded hematoma within the right maxillofacial soft tissues overlying the anterior aspect of the right  zygomatic arch (series 7, image 28). There is also scattered subcutaneous gas within the right maxillofacial soft tissues. CT CERVICAL SPINE FINDINGS Alignment: Mild C4-C5 and C6-C7 grade 1 anterolisthesis. Skull base and vertebrae: The basion-dental and atlanto-dental intervals are maintained.No evidence of acute fracture to the cervical spine. Soft tissues and spinal canal: No prevertebral fluid or swelling. No visible canal hematoma. Disc levels: Cervical spondylosis with multilevel disc space narrowing, uncovertebral and facet hypertrophy. Suspected early osseous fusion across the disc space at C5-C6 with a posterior osteophyte and uncovertebral hypertrophy at this level. Additionally, there is osseous fusion of the facet joints on the left at C2-C3 and on the right at C3-C4. Upper chest: No consolidation within the imaged lung apices. No visible pneumothorax. Other: Calcified atherosclerotic plaque within the carotid arteries bilaterally. IMPRESSION: CT head: 1. No evidence of acute intracranial abnormality. 2. Redemonstrated chronic cortically based left parietooccipital infarct. 3. Stable moderate/advanced chronic small vessel ischemic changes within the cerebral white matter. 4. Redemonstrated chronic right basal ganglia lacunar infarct. 5. Stable, mild generalized parenchymal atrophy.  CT maxillofacial: 1. Acute, comminuted, multi-segment fracture of the right zygomatic arch. The posterior most fracture fragment is medially displaced. 2. Prominent right maxillofacial/periorbital soft tissue swelling. Associated 1.9 cm rounded hematoma within the right maxillofacial soft tissues. Scattered subcutaneous gas is also present CT cervical spine: 1. No evidence of acute fracture to the cervical spine. 2. Cervical spondylosis as described with multilevel degenerative fusion. Electronically Signed   By: Kellie Simmering DO   On: 05/19/2020 20:18   CT CERVICAL SPINE WO CONTRAST  Result Date: 05/19/2020 CLINICAL DATA:  Head trauma, moderate/severe, fall. Facial trauma. Neck trauma. Patient on Eliquis. EXAM: CT HEAD WITHOUT CONTRAST CT MAXILLOFACIAL WITHOUT CONTRAST CT CERVICAL SPINE WITHOUT CONTRAST TECHNIQUE: Multidetector CT imaging of the head, cervical spine, and maxillofacial structures were performed using the standard protocol without intravenous contrast. Multiplanar CT image reconstructions of the cervical spine and maxillofacial structures were also generated. COMPARISON:  Brain MRI 02/13/2020, head CT 02/13/2020 FINDINGS: CT HEAD FINDINGS Brain: Stable, mild generalized parenchymal atrophy. 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 Stable, moderate/advanced patchy and confluent hypoattenuation within the cerebral white matter which is nonspecific, but consistent with chronic small vessel ischemic disease. Redemonstrated chronic lacunar infarct within the right lentiform nucleus. There is no acute intracranial hemorrhage. No acute demarcated cortical infarct is identified. No extra-axial fluid collection. No evidence of intracranial mass. No midline shift. Vascular: No hyperdense vessel.  Atherosclerotic calcifications. Skull: Normal. Negative for fracture or focal lesion. CT MAXILLOFACIAL FINDINGS Osseous: There are comminuted, multi-segment acute fractures of the  right zygomatic arch. There is medial displacement of the posterior-most fracture fragment. Orbits: No acute abnormality. The globes are normal in size and contour. The extra-ocular muscles and optic nerve sheath complexes are symmetric and unremarkable. Sinuses: No significant paranasal sinus disease or mastoid effusion. Soft tissues: There is prominent right periorbital and right maxillofacial soft tissue swelling. 1.9 cm rounded hematoma within the right maxillofacial soft tissues overlying the anterior aspect of the right zygomatic arch (series 7, image 28). There is also scattered subcutaneous gas within the right maxillofacial soft tissues. CT CERVICAL SPINE FINDINGS Alignment: Mild C4-C5 and C6-C7 grade 1 anterolisthesis. Skull base and vertebrae: The basion-dental and atlanto-dental intervals are maintained.No evidence of acute fracture to the cervical spine. Soft tissues and spinal canal: No prevertebral fluid or swelling. No visible canal hematoma. Disc levels: Cervical spondylosis with multilevel disc space narrowing, uncovertebral and facet hypertrophy. Suspected early  osseous fusion across the disc space at C5-C6 with a posterior osteophyte and uncovertebral hypertrophy at this level. Additionally, there is osseous fusion of the facet joints on the left at C2-C3 and on the right at C3-C4. Upper chest: No consolidation within the imaged lung apices. No visible pneumothorax. Other: Calcified atherosclerotic plaque within the carotid arteries bilaterally. IMPRESSION: CT head: 1. No evidence of acute intracranial abnormality. 2. Redemonstrated chronic cortically based left parietooccipital infarct. 3. Stable moderate/advanced chronic small vessel ischemic changes within the cerebral white matter. 4. Redemonstrated chronic right basal ganglia lacunar infarct. 5. Stable, mild generalized parenchymal atrophy. CT maxillofacial: 1. Acute, comminuted, multi-segment fracture of the right zygomatic arch. The  posterior most fracture fragment is medially displaced. 2. Prominent right maxillofacial/periorbital soft tissue swelling. Associated 1.9 cm rounded hematoma within the right maxillofacial soft tissues. Scattered subcutaneous gas is also present CT cervical spine: 1. No evidence of acute fracture to the cervical spine. 2. Cervical spondylosis as described with multilevel degenerative fusion. Electronically Signed   By: Kellie Simmering DO   On: 05/19/2020 20:18   DG Pelvis Portable  Result Date: 05/19/2020 CLINICAL DATA:  Fall earlier today. EXAM: PORTABLE PELVIS 1-2 VIEWS COMPARISON:  None. FINDINGS: Right hip arthroplasty in expected alignment. No evidence of periprosthetic or acute pelvic fracture. Advanced left hip osteoarthritis. The pubic rami are intact. Pubic symphysis and sacroiliac joints are congruent. Popcorn calcification in the pelvis is typical of phleboliths. Vascular stent in the left leg. IMPRESSION: No acute pelvic fracture. Right hip arthroplasty in expected alignment. Electronically Signed   By: Keith Rake M.D.   On: 05/19/2020 19:55   DG Chest Port 1 View  Result Date: 05/19/2020 CLINICAL DATA:  Fall earlier today. EXAM: PORTABLE CHEST 1 VIEW COMPARISON:  Chest radiograph 02/13/2020.  Chest CT 09/30/2019 FINDINGS: Unchanged heart size and mediastinal contours. Right retrocardiac hiatal hernia obscures accurate assessment of the cardiac silhouette. Implanted loop recorder projects over the left chest wall. No focal airspace disease, pneumothorax or large pleural effusion. Chronic change about both shoulders. No acute osseous abnormalities are seen. IMPRESSION: 1. No acute findings. 2. Large right retrocardiac hiatal hernia. Electronically Signed   By: Keith Rake M.D.   On: 05/19/2020 19:54   CT MAXILLOFACIAL WO CONTRAST  Result Date: 05/19/2020 CLINICAL DATA:  Head trauma, moderate/severe, fall. Facial trauma. Neck trauma. Patient on Eliquis. EXAM: CT HEAD WITHOUT CONTRAST CT  MAXILLOFACIAL WITHOUT CONTRAST CT CERVICAL SPINE WITHOUT CONTRAST TECHNIQUE: Multidetector CT imaging of the head, cervical spine, and maxillofacial structures were performed using the standard protocol without intravenous contrast. Multiplanar CT image reconstructions of the cervical spine and maxillofacial structures were also generated. COMPARISON:  Brain MRI 02/13/2020, head CT 02/13/2020 FINDINGS: CT HEAD FINDINGS Brain: Stable, mild generalized parenchymal atrophy. 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 Stable, moderate/advanced patchy and confluent hypoattenuation within the cerebral white matter which is nonspecific, but consistent with chronic small vessel ischemic disease. Redemonstrated chronic lacunar infarct within the right lentiform nucleus. There is no acute intracranial hemorrhage. No acute demarcated cortical infarct is identified. No extra-axial fluid collection. No evidence of intracranial mass. No midline shift. Vascular: No hyperdense vessel.  Atherosclerotic calcifications. Skull: Normal. Negative for fracture or focal lesion. CT MAXILLOFACIAL FINDINGS Osseous: There are comminuted, multi-segment acute fractures of the right zygomatic arch. There is medial displacement of the posterior-most fracture fragment. Orbits: No acute abnormality. The globes are normal in size and contour. The extra-ocular muscles and optic nerve sheath complexes are symmetric and  unremarkable. Sinuses: No significant paranasal sinus disease or mastoid effusion. Soft tissues: There is prominent right periorbital and right maxillofacial soft tissue swelling. 1.9 cm rounded hematoma within the right maxillofacial soft tissues overlying the anterior aspect of the right zygomatic arch (series 7, image 28). There is also scattered subcutaneous gas within the right maxillofacial soft tissues. CT CERVICAL SPINE FINDINGS Alignment: Mild C4-C5 and C6-C7 grade 1 anterolisthesis. Skull base  and vertebrae: The basion-dental and atlanto-dental intervals are maintained.No evidence of acute fracture to the cervical spine. Soft tissues and spinal canal: No prevertebral fluid or swelling. No visible canal hematoma. Disc levels: Cervical spondylosis with multilevel disc space narrowing, uncovertebral and facet hypertrophy. Suspected early osseous fusion across the disc space at C5-C6 with a posterior osteophyte and uncovertebral hypertrophy at this level. Additionally, there is osseous fusion of the facet joints on the left at C2-C3 and on the right at C3-C4. Upper chest: No consolidation within the imaged lung apices. No visible pneumothorax. Other: Calcified atherosclerotic plaque within the carotid arteries bilaterally. IMPRESSION: CT head: 1. No evidence of acute intracranial abnormality. 2. Redemonstrated chronic cortically based left parietooccipital infarct. 3. Stable moderate/advanced chronic small vessel ischemic changes within the cerebral white matter. 4. Redemonstrated chronic right basal ganglia lacunar infarct. 5. Stable, mild generalized parenchymal atrophy. CT maxillofacial: 1. Acute, comminuted, multi-segment fracture of the right zygomatic arch. The posterior most fracture fragment is medially displaced. 2. Prominent right maxillofacial/periorbital soft tissue swelling. Associated 1.9 cm rounded hematoma within the right maxillofacial soft tissues. Scattered subcutaneous gas is also present CT cervical spine: 1. No evidence of acute fracture to the cervical spine. 2. Cervical spondylosis as described with multilevel degenerative fusion. Electronically Signed   By: Kellie Simmering DO   On: 05/19/2020 20:18    Procedures  MDM         Garald Balding, PA-C 05/19/20 [id number]    Breck Coons, MD 05/22/20 [phone]

## 2020-05-19 NOTE — ED Provider Notes (Addendum)
[facility] Provider Note   CSN: [id number] Arrival date & time: 05/19/20  1918     History Chief Complaint  Patient presents with  . Fall    Alyssa Odonnell is a 86 y.o. female.   Fall This is a new problem. The current episode started less than 1 hour ago. The problem occurs rarely. The problem has not changed since onset.Associated symptoms include headaches (mild). Pertinent negatives include no chest pain and no shortness of breath. Nothing aggravates the symptoms. Nothing relieves the symptoms. Treatments tried: c collar. The treatment provided no relief.       No past medical history on file.  There are no problems to display for this patient.    The histories are not reviewed yet. Please review them in the "History" navigator section and refresh this [place].   OB History   No obstetric history on file.     No family history on file.  Social History   Tobacco Use  . Smoking status: Not on file  Substance Use Topics  . Alcohol use: Not on file  . Drug use: Not on file    Home Medications Prior to Admission medications   Not on File    Allergies    Patient has no allergy information on record.  Review of Systems   Review of Systems  Constitutional: Negative for chills and fever.  HENT: Positive for facial swelling. Negative for congestion and rhinorrhea.   Respiratory: Negative for cough and shortness of breath.   Cardiovascular: Negative for chest pain and palpitations.  Gastrointestinal: Negative for diarrhea, nausea and vomiting.  Genitourinary: Negative for difficulty urinating and dysuria.  Musculoskeletal: Negative for arthralgias and back pain.  Skin: Negative for rash and wound.  Neurological: Positive for headaches (mild). Negative for light-headedness.  Hematological: Bruises/bleeds easily.    Physical Exam Updated Vital Signs BP (!) 144/87   Pulse 73   Temp 98.6 F (37 C) (Oral)   Resp 20    SpO2 97%   Physical Exam Vitals and nursing note reviewed. Exam conducted with a chaperone present.  Constitutional:      General: She is not in acute distress.    Appearance: Normal appearance.  HENT:     Head: Normocephalic.      Nose: No rhinorrhea.  Eyes:     General:        Right eye: No discharge.        Left eye: No discharge.     Extraocular Movements: Extraocular movements intact.     Right eye: No nystagmus.     Left eye: No nystagmus.     Conjunctiva/sclera: Conjunctivae normal.  Cardiovascular:     Rate and Rhythm: Normal rate and regular rhythm.  Pulmonary:     Effort: Pulmonary effort is normal. No respiratory distress.     Breath sounds: No stridor.  Abdominal:     General: Abdomen is flat. There is no distension.     Palpations: Abdomen is soft.  Musculoskeletal:        General: No tenderness or signs of injury.     Comments: No obvious signs of trauma to extremities no tenderness to palpation throughout all extremities  Skin:    General: Skin is warm and dry.  Neurological:     General: No focal deficit present.     Mental Status: She is alert. Mental status is at baseline.     Motor: No weakness.  Comments: Sensation intact in all extremities, able to move all extremities at baseline, alert and oriented.  Psychiatric:        Mood and Affect: Mood normal.        Behavior: Behavior normal.     ED Results / Procedures / Treatments   Labs (all labs ordered are listed, but only abnormal results are displayed) Labs Reviewed  COMPREHENSIVE METABOLIC PANEL - Abnormal; Notable for the following components:      Result Value   Glucose, Bld 103 (*)    Creatinine, Ser 1.32 (*)    Total Protein 6.4 (*)    Albumin 3.3 (*)    GFR calc non Af Amer 37 (*)    GFR calc Af Amer 43 (*)    All other components within normal limits  I-STAT CHEM 8, ED - Abnormal; Notable for the following components:   Creatinine, Ser 1.30 (*)    Hemoglobin 11.9 (*)    HCT  35.0 (*)    All other components within normal limits  CBC  LACTIC ACID, PLASMA  PROTIME-INR  URINALYSIS, ROUTINE W REFLEX MICROSCOPIC  SAMPLE TO BLOOD BANK    EKG None  Radiology CT HEAD WO CONTRAST  Result Date: 05/19/2020 CLINICAL DATA:  Head trauma, moderate/severe, fall. Facial trauma. Neck trauma. Patient on Eliquis. EXAM: CT HEAD WITHOUT CONTRAST CT MAXILLOFACIAL WITHOUT CONTRAST CT CERVICAL SPINE WITHOUT CONTRAST TECHNIQUE: Multidetector CT imaging of the head, cervical spine, and maxillofacial structures were performed using the standard protocol without intravenous contrast. Multiplanar CT image reconstructions of the cervical spine and maxillofacial structures were also generated. COMPARISON:  Brain MRI 02/13/2020, head CT 02/13/2020 FINDINGS: CT HEAD FINDINGS Brain: Stable, mild generalized parenchymal atrophy. 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 Stable, moderate/advanced patchy and confluent hypoattenuation within the cerebral white matter which is nonspecific, but consistent with chronic small vessel ischemic disease. Redemonstrated chronic lacunar infarct within the right lentiform nucleus. There is no acute intracranial hemorrhage. No acute demarcated cortical infarct is identified. No extra-axial fluid collection. No evidence of intracranial mass. No midline shift. Vascular: No hyperdense vessel.  Atherosclerotic calcifications. Skull: Normal. Negative for fracture or focal lesion. CT MAXILLOFACIAL FINDINGS Osseous: There are comminuted, multi-segment acute fractures of the right zygomatic arch. There is medial displacement of the posterior-most fracture fragment. Orbits: No acute abnormality. The globes are normal in size and contour. The extra-ocular muscles and optic nerve sheath complexes are symmetric and unremarkable. Sinuses: No significant paranasal sinus disease or mastoid effusion. Soft tissues: There is prominent right periorbital and  right maxillofacial soft tissue swelling. 1.9 cm rounded hematoma within the right maxillofacial soft tissues overlying the anterior aspect of the right zygomatic arch (series 7, image 28). There is also scattered subcutaneous gas within the right maxillofacial soft tissues. CT CERVICAL SPINE FINDINGS Alignment: Mild C4-C5 and C6-C7 grade 1 anterolisthesis. Skull base and vertebrae: The basion-dental and atlanto-dental intervals are maintained.No evidence of acute fracture to the cervical spine. Soft tissues and spinal canal: No prevertebral fluid or swelling. No visible canal hematoma. Disc levels: Cervical spondylosis with multilevel disc space narrowing, uncovertebral and facet hypertrophy. Suspected early osseous fusion across the disc space at C5-C6 with a posterior osteophyte and uncovertebral hypertrophy at this level. Additionally, there is osseous fusion of the facet joints on the left at C2-C3 and on the right at C3-C4. Upper chest: No consolidation within the imaged lung apices. No visible pneumothorax. Other: Calcified atherosclerotic plaque within the carotid arteries bilaterally. IMPRESSION: CT head:  1. No evidence of acute intracranial abnormality. 2. Redemonstrated chronic cortically based left parietooccipital infarct. 3. Stable moderate/advanced chronic small vessel ischemic changes within the cerebral white matter. 4. Redemonstrated chronic right basal ganglia lacunar infarct. 5. Stable, mild generalized parenchymal atrophy. CT maxillofacial: 1. Acute, comminuted, multi-segment fracture of the right zygomatic arch. The posterior most fracture fragment is medially displaced. 2. Prominent right maxillofacial/periorbital soft tissue swelling. Associated 1.9 cm rounded hematoma within the right maxillofacial soft tissues. Scattered subcutaneous gas is also present CT cervical spine: 1. No evidence of acute fracture to the cervical spine. 2. Cervical spondylosis as described with multilevel  degenerative fusion. Electronically Signed   By: Kellie Simmering DO   On: 05/19/2020 20:18   CT CERVICAL SPINE WO CONTRAST  Result Date: 05/19/2020 CLINICAL DATA:  Head trauma, moderate/severe, fall. Facial trauma. Neck trauma. Patient on Eliquis. EXAM: CT HEAD WITHOUT CONTRAST CT MAXILLOFACIAL WITHOUT CONTRAST CT CERVICAL SPINE WITHOUT CONTRAST TECHNIQUE: Multidetector CT imaging of the head, cervical spine, and maxillofacial structures were performed using the standard protocol without intravenous contrast. Multiplanar CT image reconstructions of the cervical spine and maxillofacial structures were also generated. COMPARISON:  Brain MRI 02/13/2020, head CT 02/13/2020 FINDINGS: CT HEAD FINDINGS Brain: Stable, mild generalized parenchymal atrophy. 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 Stable, moderate/advanced patchy and confluent hypoattenuation within the cerebral white matter which is nonspecific, but consistent with chronic small vessel ischemic disease. Redemonstrated chronic lacunar infarct within the right lentiform nucleus. There is no acute intracranial hemorrhage. No acute demarcated cortical infarct is identified. No extra-axial fluid collection. No evidence of intracranial mass. No midline shift. Vascular: No hyperdense vessel.  Atherosclerotic calcifications. Skull: Normal. Negative for fracture or focal lesion. CT MAXILLOFACIAL FINDINGS Osseous: There are comminuted, multi-segment acute fractures of the right zygomatic arch. There is medial displacement of the posterior-most fracture fragment. Orbits: No acute abnormality. The globes are normal in size and contour. The extra-ocular muscles and optic nerve sheath complexes are symmetric and unremarkable. Sinuses: No significant paranasal sinus disease or mastoid effusion. Soft tissues: There is prominent right periorbital and right maxillofacial soft tissue swelling. 1.9 cm rounded hematoma within the right  maxillofacial soft tissues overlying the anterior aspect of the right zygomatic arch (series 7, image 28). There is also scattered subcutaneous gas within the right maxillofacial soft tissues. CT CERVICAL SPINE FINDINGS Alignment: Mild C4-C5 and C6-C7 grade 1 anterolisthesis. Skull base and vertebrae: The basion-dental and atlanto-dental intervals are maintained.No evidence of acute fracture to the cervical spine. Soft tissues and spinal canal: No prevertebral fluid or swelling. No visible canal hematoma. Disc levels: Cervical spondylosis with multilevel disc space narrowing, uncovertebral and facet hypertrophy. Suspected early osseous fusion across the disc space at C5-C6 with a posterior osteophyte and uncovertebral hypertrophy at this level. Additionally, there is osseous fusion of the facet joints on the left at C2-C3 and on the right at C3-C4. Upper chest: No consolidation within the imaged lung apices. No visible pneumothorax. Other: Calcified atherosclerotic plaque within the carotid arteries bilaterally. IMPRESSION: CT head: 1. No evidence of acute intracranial abnormality. 2. Redemonstrated chronic cortically based left parietooccipital infarct. 3. Stable moderate/advanced chronic small vessel ischemic changes within the cerebral white matter. 4. Redemonstrated chronic right basal ganglia lacunar infarct. 5. Stable, mild generalized parenchymal atrophy. CT maxillofacial: 1. Acute, comminuted, multi-segment fracture of the right zygomatic arch. The posterior most fracture fragment is medially displaced. 2. Prominent right maxillofacial/periorbital soft tissue swelling. Associated 1.9 cm rounded hematoma within the right maxillofacial  soft tissues. Scattered subcutaneous gas is also present CT cervical spine: 1. No evidence of acute fracture to the cervical spine. 2. Cervical spondylosis as described with multilevel degenerative fusion. Electronically Signed   By: Kellie Simmering DO   On: 05/19/2020 20:18   DG  Pelvis Portable  Result Date: 05/19/2020 CLINICAL DATA:  Fall earlier today. EXAM: PORTABLE PELVIS 1-2 VIEWS COMPARISON:  None. FINDINGS: Right hip arthroplasty in expected alignment. No evidence of periprosthetic or acute pelvic fracture. Advanced left hip osteoarthritis. The pubic rami are intact. Pubic symphysis and sacroiliac joints are congruent. Popcorn calcification in the pelvis is typical of phleboliths. Vascular stent in the left leg. IMPRESSION: No acute pelvic fracture. Right hip arthroplasty in expected alignment. Electronically Signed   By: Keith Rake M.D.   On: 05/19/2020 19:55   DG Chest Port 1 View  Result Date: 05/19/2020 CLINICAL DATA:  Fall earlier today. EXAM: PORTABLE CHEST 1 VIEW COMPARISON:  Chest radiograph 02/13/2020.  Chest CT 09/30/2019 FINDINGS: Unchanged heart size and mediastinal contours. Right retrocardiac hiatal hernia obscures accurate assessment of the cardiac silhouette. Implanted loop recorder projects over the left chest wall. No focal airspace disease, pneumothorax or large pleural effusion. Chronic change about both shoulders. No acute osseous abnormalities are seen. IMPRESSION: 1. No acute findings. 2. Large right retrocardiac hiatal hernia. Electronically Signed   By: Keith Rake M.D.   On: 05/19/2020 19:54   CT MAXILLOFACIAL WO CONTRAST  Result Date: 05/19/2020 CLINICAL DATA:  Head trauma, moderate/severe, fall. Facial trauma. Neck trauma. Patient on Eliquis. EXAM: CT HEAD WITHOUT CONTRAST CT MAXILLOFACIAL WITHOUT CONTRAST CT CERVICAL SPINE WITHOUT CONTRAST TECHNIQUE: Multidetector CT imaging of the head, cervical spine, and maxillofacial structures were performed using the standard protocol without intravenous contrast. Multiplanar CT image reconstructions of the cervical spine and maxillofacial structures were also generated. COMPARISON:  Brain MRI 02/13/2020, head CT 02/13/2020 FINDINGS: CT HEAD FINDINGS Brain: Stable, mild generalized parenchymal  atrophy. 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 Stable, moderate/advanced patchy and confluent hypoattenuation within the cerebral white matter which is nonspecific, but consistent with chronic small vessel ischemic disease. Redemonstrated chronic lacunar infarct within the right lentiform nucleus. There is no acute intracranial hemorrhage. No acute demarcated cortical infarct is identified. No extra-axial fluid collection. No evidence of intracranial mass. No midline shift. Vascular: No hyperdense vessel.  Atherosclerotic calcifications. Skull: Normal. Negative for fracture or focal lesion. CT MAXILLOFACIAL FINDINGS Osseous: There are comminuted, multi-segment acute fractures of the right zygomatic arch. There is medial displacement of the posterior-most fracture fragment. Orbits: No acute abnormality. The globes are normal in size and contour. The extra-ocular muscles and optic nerve sheath complexes are symmetric and unremarkable. Sinuses: No significant paranasal sinus disease or mastoid effusion. Soft tissues: There is prominent right periorbital and right maxillofacial soft tissue swelling. 1.9 cm rounded hematoma within the right maxillofacial soft tissues overlying the anterior aspect of the right zygomatic arch (series 7, image 28). There is also scattered subcutaneous gas within the right maxillofacial soft tissues. CT CERVICAL SPINE FINDINGS Alignment: Mild C4-C5 and C6-C7 grade 1 anterolisthesis. Skull base and vertebrae: The basion-dental and atlanto-dental intervals are maintained.No evidence of acute fracture to the cervical spine. Soft tissues and spinal canal: No prevertebral fluid or swelling. No visible canal hematoma. Disc levels: Cervical spondylosis with multilevel disc space narrowing, uncovertebral and facet hypertrophy. Suspected early osseous fusion across the disc space at C5-C6 with a posterior osteophyte and uncovertebral hypertrophy at this level.  Additionally, there is  osseous fusion of the facet joints on the left at C2-C3 and on the right at C3-C4. Upper chest: No consolidation within the imaged lung apices. No visible pneumothorax. Other: Calcified atherosclerotic plaque within the carotid arteries bilaterally. IMPRESSION: CT head: 1. No evidence of acute intracranial abnormality. 2. Redemonstrated chronic cortically based left parietooccipital infarct. 3. Stable moderate/advanced chronic small vessel ischemic changes within the cerebral white matter. 4. Redemonstrated chronic right basal ganglia lacunar infarct. 5. Stable, mild generalized parenchymal atrophy. CT maxillofacial: 1. Acute, comminuted, multi-segment fracture of the right zygomatic arch. The posterior most fracture fragment is medially displaced. 2. Prominent right maxillofacial/periorbital soft tissue swelling. Associated 1.9 cm rounded hematoma within the right maxillofacial soft tissues. Scattered subcutaneous gas is also present CT cervical spine: 1. No evidence of acute fracture to the cervical spine. 2. Cervical spondylosis as described with multilevel degenerative fusion. Electronically Signed   By: Kellie Simmering DO   On: 05/19/2020 20:18    Procedures Procedures (including critical care time)  Medications Ordered in ED Medications  ceFAZolin (ANCEF) IVPB 2g/100 mL premix (2 g Intravenous New Bag/Given 05/19/20 2042)  fluorescein ophthalmic strip 1 strip (has no administration in time range)  sodium chloride 0.9 % bolus 500 mL (500 mLs Intravenous New Bag/Given 05/19/20 2020)    ED Course  I have reviewed the triage vital signs and the nursing notes.  Pertinent labs & imaging results that were available during my care of the patient were reviewed by me and considered in my medical decision making (see chart for details).    MDM Rules/Calculators/A&P                          Fall from standing on Eliquis.  Normal mental status.  Towel wrapped around neck to provide  cervical traction, hard collar replaced here.  Alert and oriented.  Bilateral breath sounds present protecting her airway.  Hemodynamically stable.  Chest and pelvis will be ordered, secondary survey is unremarkable other than facial injuries noted.  No neurologic dysfunction no muscular dysfunction, possible bony fracture of the face, possible intraparenchymal or intracranial hemorrhage will get CT imaging, hopefully clear the collar with radiation Calcidrine.  Possible need for repair wound on the right cheek will clean this wound and assess.  Up-to-date with vaccines.  Ancef was given, if there is a wound on the face as well as having underlying fracture seen on CT of the zygomatic bone.  ENT is consulted for facial trauma, Dr. Elodia Florence will come see the patient and evaluate for future needs, no recommended intervention at this time.  She is having some discomfort in her eye.  We will stain the eye.  CT imaging was reviewed by radiology myself.  No other acute injuries.  Chronic changes present.  Cervical collar is cleared by myself at bedside with normal range of motion and no midline bony tenderness.  The facial trauma specialist recommended soft diet no surgical intervention.  He does not feel that it warrants further antibiotics.  The patient will not need outpatient follow-up.  Patient is safe for discharge home after the wound is explored clean and dressed.  She will also need to be reevaluated for other signs of injury, and make sure she can ambulate at her baseline.  Patient will also need fluorescein staining as she has some eye discomfort with normal vision.  Pt care was handed off to on coming provider at 2100.  Complete history and physical and  current plan have been communicated.  Please refer to their note for the remainder of ED care and ultimate disposition.   Final Clinical Impression(s) / ED Diagnoses Final diagnoses:  Fall  Open fracture of right zygomatic arch, initial encounter  [facility])    Rx / DC Orders ED Discharge Orders    None       Breck Coons, MD 05/19/20 2106    Breck Coons, MD 05/19/20 2109

## 2020-07-02 ENCOUNTER — Telehealth: Payer: Self-pay | Admitting: Neurology

## 2020-07-02 NOTE — Telephone Encounter (Signed)
Pt's caretaker Denton Ar called stating that the divalproex (DEPAKOTE SPRINKLE) 125 MG capsule is not working for the pt. Please advise.

## 2020-07-03 NOTE — Telephone Encounter (Signed)
If patient has bipolar, she needs a psychiatry referral for management as medications we use for dementia may cause worsening in bipolar patients. Please call pcp for referral or call insurance for a list of psychiatrists. They can try [facility] and see if she has any openings or if anyone in that practice does, they treat bipolar and dementia with behavioral disturbances thanks

## 2020-07-03 NOTE — Telephone Encounter (Addendum)
I called the son [name] (on [place]) and LVM asking for call back.   He called back shortly and we discussed the pt's behavior. She is verbally abusive to caregivers and family. He states pt has been bipolar her whole life but hasn't been treated. She has always been one to fly off the handle and would "make a sailor blush". He states if her behavior can't be controlled he will have to put her in a care facility. She has CNA caregivers. The son visits daily. He stated she is up and down all day, doesn't always know she is at her own home. She won't let people touch her. Last week the NP w/ primary care told them to increase Depakote to 250 mg twice daily but the pt got worse and was even more unsettled. They decreased the dose back to 125 mg BID. Son stated he would like to explore any other options. He verbalized appreciation for the call.

## 2020-07-04 NOTE — Telephone Encounter (Signed)
I spoke with the pt's son [name] (on [place]) and discussed message from Dr Jaynee Eagles regarding recommendation for pt to see psychiatry to treat Bipolar and Dementia w/ behavioral disturbances. He also provided new PCP name, Cathi Roan PA which I updated in the chart. He verbalized understanding and appreciation for the recommendations.

## 2020-07-19 ENCOUNTER — Other Ambulatory Visit: Payer: Self-pay

## 2020-07-19 ENCOUNTER — Encounter (HOSPITAL_COMMUNITY): Payer: Self-pay

## 2020-07-19 ENCOUNTER — Emergency Department (HOSPITAL_COMMUNITY)
Admission: EM | Admit: 2020-07-19 | Discharge: 2020-07-29 | Disposition: A | Discharge status: Home / Self Care | Payer: Medicare Other | Attending: Emergency Medicine | Admitting: Emergency Medicine

## 2020-07-19 ENCOUNTER — Emergency Department (HOSPITAL_COMMUNITY): Payer: Medicare Other

## 2020-07-19 DIAGNOSIS — Z7901 Long term (current) use of anticoagulants: Secondary | ICD-10-CM | POA: Insufficient documentation

## 2020-07-19 DIAGNOSIS — F600 Paranoid personality disorder: Secondary | ICD-10-CM | POA: Insufficient documentation

## 2020-07-19 DIAGNOSIS — F4321 Adjustment disorder with depressed mood: Secondary | ICD-10-CM | POA: Diagnosis present

## 2020-07-19 DIAGNOSIS — I10 Essential (primary) hypertension: Secondary | ICD-10-CM | POA: Diagnosis not present

## 2020-07-19 DIAGNOSIS — Z20822 Contact with and (suspected) exposure to covid-19: Secondary | ICD-10-CM | POA: Diagnosis not present

## 2020-07-19 DIAGNOSIS — E039 Hypothyroidism, unspecified: Secondary | ICD-10-CM | POA: Diagnosis not present

## 2020-07-19 DIAGNOSIS — F332 Major depressive disorder, recurrent severe without psychotic features: Secondary | ICD-10-CM | POA: Insufficient documentation

## 2020-07-19 DIAGNOSIS — Z9181 History of falling: Secondary | ICD-10-CM | POA: Insufficient documentation

## 2020-07-19 DIAGNOSIS — Z8673 Personal history of transient ischemic attack (TIA), and cerebral infarction without residual deficits: Secondary | ICD-10-CM | POA: Insufficient documentation

## 2020-07-19 DIAGNOSIS — Z046 Encounter for general psychiatric examination, requested by authority: Secondary | ICD-10-CM | POA: Insufficient documentation

## 2020-07-19 DIAGNOSIS — Z96649 Presence of unspecified artificial hip joint: Secondary | ICD-10-CM | POA: Diagnosis not present

## 2020-07-19 DIAGNOSIS — Z79899 Other long term (current) drug therapy: Secondary | ICD-10-CM | POA: Insufficient documentation

## 2020-07-19 DIAGNOSIS — F0391 Unspecified dementia with behavioral disturbance: Secondary | ICD-10-CM | POA: Diagnosis not present

## 2020-07-19 DIAGNOSIS — I480 Paroxysmal atrial fibrillation: Secondary | ICD-10-CM | POA: Insufficient documentation

## 2020-07-19 LAB — CBC WITH DIFFERENTIAL/PLATELET
Abs Immature Granulocytes: 0.03 10*3/uL (ref 0.00–0.07)
Basophils Absolute: 0 10*3/uL (ref 0.0–0.1)
Basophils Relative: 1 %
Eosinophils Absolute: 0.2 10*3/uL (ref 0.0–0.5)
Eosinophils Relative: 2 %
HCT: 40.6 % (ref 36.0–46.0)
Hemoglobin: 13.1 g/dL (ref 12.0–15.0)
Immature Granulocytes: 0 %
Lymphocytes Relative: 29 %
Lymphs Abs: 2 10*3/uL (ref 0.7–4.0)
MCH: 30 pg (ref 26.0–34.0)
MCHC: 32.3 g/dL (ref 30.0–36.0)
MCV: 93.1 fL (ref 80.0–100.0)
Monocytes Absolute: 0.5 10*3/uL (ref 0.1–1.0)
Monocytes Relative: 6 %
Neutro Abs: 4.4 10*3/uL (ref 1.7–7.7)
Neutrophils Relative %: 62 %
Platelets: 206 10*3/uL (ref 150–400)
RBC: 4.36 MIL/uL (ref 3.87–5.11)
RDW: 13.2 % (ref 11.5–15.5)
WBC: 7.2 10*3/uL (ref 4.0–10.5)
nRBC: 0 % (ref 0.0–0.2)

## 2020-07-19 LAB — COMPREHENSIVE METABOLIC PANEL
ALT: 15 U/L (ref 0–44)
AST: 16 U/L (ref 15–41)
Albumin: 4.4 g/dL (ref 3.5–5.0)
Alkaline Phosphatase: 78 U/L (ref 38–126)
Anion gap: 11 (ref 5–15)
BUN: 17 mg/dL (ref 8–23)
CO2: 23 mmol/L (ref 22–32)
Calcium: 9.3 mg/dL (ref 8.9–10.3)
Chloride: 108 mmol/L (ref 98–111)
Creatinine, Ser: 1.38 mg/dL — ABNORMAL HIGH (ref 0.44–1.00)
GFR calc Af Amer: 41 mL/min — ABNORMAL LOW (ref >60)
GFR calc non Af Amer: 35 mL/min — ABNORMAL LOW (ref >60)
Glucose, Bld: 99 mg/dL (ref 70–99)
Potassium: 3.7 mmol/L (ref 3.5–5.1)
Sodium: 142 mmol/L (ref 135–145)
Total Bilirubin: 0.7 mg/dL (ref 0.3–1.2)
Total Protein: 7.3 g/dL (ref 6.5–8.1)

## 2020-07-19 LAB — RESPIRATORY PANEL BY RT PCR (FLU A&B, COVID)
Influenza A by PCR: NEGATIVE
Influenza B by PCR: NEGATIVE
SARS Coronavirus 2 by RT PCR: NEGATIVE

## 2020-07-19 LAB — TSH: TSH: 1.994 u[IU]/mL (ref 0.350–4.500)

## 2020-07-19 LAB — ETHANOL: Alcohol, Ethyl (B): <10 mg/dL (ref <10)

## 2020-07-19 LAB — VALPROIC ACID LEVEL: Valproic Acid Lvl: 36 ug/mL — ABNORMAL LOW (ref 50.0–100.0)

## 2020-07-19 MED ORDER — PANTOPRAZOLE SODIUM 40 MG PO TBEC
40.0000 mg | DELAYED_RELEASE_TABLET | Freq: Every day | ORAL | Status: DC
  Administered 2020-07-19 – 2020-07-29 (×10): 40 mg via ORAL
  Filled 2020-07-19 (×11): qty 1

## 2020-07-19 MED ORDER — LORAZEPAM 0.5 MG PO TABS
0.5000 mg | ORAL_TABLET | Freq: Two times a day (BID) | ORAL | Status: DC
  Administered 2020-07-19 – 2020-07-29 (×19): 0.5 mg via ORAL
  Filled 2020-07-19 (×19): qty 1

## 2020-07-19 MED ORDER — APIXABAN 5 MG PO TABS
5.0000 mg | ORAL_TABLET | Freq: Two times a day (BID) | ORAL | Status: DC
  Administered 2020-07-20 – 2020-07-29 (×17): 5 mg via ORAL
  Filled 2020-07-19 (×20): qty 1

## 2020-07-19 MED ORDER — ESCITALOPRAM OXALATE 10 MG PO TABS
20.0000 mg | ORAL_TABLET | Freq: Every day | ORAL | Status: DC
  Administered 2020-07-19 – 2020-07-29 (×10): 20 mg via ORAL
  Filled 2020-07-19 (×11): qty 2

## 2020-07-19 MED ORDER — ADULT MULTIVITAMIN W/MINERALS CH
1.0000 | ORAL_TABLET | Freq: Every day | ORAL | Status: DC
  Administered 2020-07-19 – 2020-07-29 (×10): 1 via ORAL
  Filled 2020-07-19 (×11): qty 1

## 2020-07-19 MED ORDER — LEVOTHYROXINE SODIUM 100 MCG PO TABS
100.0000 ug | ORAL_TABLET | Freq: Every day | ORAL | Status: DC
  Administered 2020-07-21 – 2020-07-29 (×9): 100 ug via ORAL
  Filled 2020-07-19 (×9): qty 1

## 2020-07-19 MED ORDER — DIVALPROEX SODIUM 125 MG PO CSDR
125.0000 mg | DELAYED_RELEASE_CAPSULE | Freq: Two times a day (BID) | ORAL | Status: DC
  Administered 2020-07-20: 125 mg via ORAL
  Filled 2020-07-19 (×2): qty 1

## 2020-07-19 MED ORDER — CLOPIDOGREL BISULFATE 75 MG PO TABS
75.0000 mg | ORAL_TABLET | Freq: Every day | ORAL | Status: DC
  Administered 2020-07-20 – 2020-07-29 (×9): 75 mg via ORAL
  Filled 2020-07-19 (×11): qty 1

## 2020-07-19 MED ORDER — LATANOPROST 0.005 % OP SOLN
1.0000 [drp] | Freq: Every day | OPHTHALMIC | Status: DC
  Administered 2020-07-20 – 2020-07-28 (×8): 1 [drp] via OPHTHALMIC
  Filled 2020-07-19 (×2): qty 2.5
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
## 2020-07-19 NOTE — BH Assessment (Addendum)
Comprehensive Clinical Assessment (CCA) Note  07/19/2020 Alyssa Odonnell [id number]  Pt is a 86 year old widowed female who presents unaccompanied to [facility] via Event organ[facility] after being petitioned for involuntary commitment by her son, [name] [phone]. Affidavit and petition states: "Petitioner states: The respondent has been diagnosed with dementia. Recently, the respondent has been saying, "I just want to die." The respondent will go through manic epsiodes where she is ranting, raving and screaming. For instance today she got up cursing and yelling at everyone and she would not calm down. The neighbors resorted to driving her around in a golf cart around the neighborhood just to calm her down. The respondent says she hates everything and she is going to burn the house down. The respondent talks about she is going to see people who have been dead for years."  Pt says she was brought to [facility] "because my family got mad at me." She says her son and grandson want her put in a nursing home because they want her money and her property. Pt states she feels depressed, angry, and frustrated. "I get no enjoyment from anything." She says she feels anxious "all the time." She says she has difficulty sleeping, does not have an appetite and has been losing weight. She denies current plan to kill herself but says repeatedly makes statements such as "I don't care if I live or die" and "I would be better off dead." She denies current thoughts of harming others. She denies auditory or visual hallucinations. She denies use of alcohol or other substances.  Pt describes being profoundly unhappy. She says she is in a wheelchair and cannot care for her garden. She says people are concerned that she will fall but she does not care if she falls. She says her daughter died in 11/05/2023 and she is grieving. She says she has three other sons. She says she has caregivers in the home all the time but she feels they  are incompetent and she does not need them. She says her husband died of a heart attack 50 years ago. Pt says she has a history of a stroke and falls and has been in nursing facilities in the past. She denies any history of mental health treatment. 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  TTS contacted Pt's son/petitioner [name] for collateral information. He says he is Pt's POA. He says Pt's mood and behavior have worsened over the past month. He says she has always been "an angry person" and had a history of explosive anger outbursts but recently she always appears angry. He says she has crying spells which last for hours when Pt is inconsolable. He says Pt curses and rants and will not calm down. He states that Pt has a CNA in the home 24/7 but they are quitting because they cannot tolerate Pt's behavior. He states Pt believes everyone is trying to harm her. He says Pt states she wants to go home when she is already in her home and insists this is not where she lives. He says Pt says no one is feeding her but the problem is Pt will not eat. He reports when Pt was in a nursing facility she was prescribed Depakote but it was too sedating and had severe side effects.   Pt is dressed in hospital scrubs, alert and oriented x4. Pt speaks in a clear tone, at moderate volume and normal pace. Pt frequently uses profanity. Motor behavior appears normal. Eye contact  is good and Pt is tearful at times. Pt's mood is depressed, angry, anxious and affect is congruent with mood. Thought process is coherent and relevant. There is no indication Pt is currently responding to internal stimuli. Pt initially was hostile but quickly began talking about her concerns. She says she wants to go home.   Visit Diagnosis:   F33.2 Major depressive disorder, Recurrent episode, Severe F01.51 Major vascular neurocognitive disorder, Probable, With behavioral disturbance   ICD-10-CM   1. Dementia with behavioral disturbance, unspecified  dementia type ([place])  F03.91      DISPOSITION: Gave clinical report to Caroline Sauger, NP who recommends Pt be admitted to an inpatient geriatric-psychiatry facility. Notified [name], MD of recommendation. Notified Pt's son/petitioner [name] of recommendation.   PHQ9 SCORE ONLY 07/19/2020  PHQ-9 Total Score 13    CCA Screening, Triage and Referral (STR)  Patient Reported Information How did you hear about [place]? Family/Friend  Referral name: No data recorded Referral phone number: No data recorded  Whom do you see for routine medical problems? Other (Comment)  Practice/Facility Name: No data recorded Practice/Facility Phone Number: No data recorded Name of Contact: No data recorded Contact Number: No data recorded Contact Fax Number: No data recorded Prescriber Name: No data recorded Prescriber Address (if known): No data recorded  What Is the Reason for Your Visit/Call Today? Pt is petitioned for IVC. She reports feeling depressed, frustrated and hopeless.  How Long Has This Been Causing You Problems? > than 6 months  What Do You Feel Would Help You the Most Today? Assessment Only   Have You Recently Been in Any Inpatient Treatment (Hospital/Detox/Crisis Center/28-Day Program)? No  Name/Location of Program/Hospital:No data recorded How Long Were You There? No data recorded When Were You Discharged? No data recorded  Have You Ever Received Services From [facility] Before? Yes  Who Do You See at [facility]? ED visits   Have You Recently Had Any Thoughts About Hurting Yourself? No  Are You Planning to Commit Suicide/Harm Yourself At This time? No   Have you Recently Had Thoughts About [place]? No  Explanation: No data recorded  Have You Used Any Alcohol or Drugs in the Past 24 Hours? No  How Long Ago Did You Use Drugs or Alcohol? No data recorded What Did You Use and How Much? No data recorded  Do You Currently Have a  Therapist/Psychiatrist? No  Name of Therapist/Psychiatrist: No data recorded  Have You Been Recently Discharged From Any Office Practice or Programs? No  Explanation of Discharge From Practice/Program: No data recorded    CCA Screening Triage Referral Assessment Type of Contact: Tele-Assessment  Is this Initial or Reassessment? Initial Assessment  Date Telepsych consult ordered in [facility]:  07/19/20  Time Telepsych consult ordered in [facility]:  1709   Patient Reported Information Reviewed? Yes  Patient Left Without Being Seen? No data recorded Reason for Not Completing Assessment: No data recorded  Collateral Involvement: SonRobinette Haines [phone]   Does Patient Have a Court Appointed Legal Guardian? No data recorded Name and Contact of Legal Guardian: No data recorded If Minor and Not Living with Parent(s), Who has Custody? No data recorded Is CPS involved or ever been involved? Never  Is APS involved or ever been involved? Never   Patient Determined To Be At Risk for Harm To Self or Others Based on Review of Patient Reported Information or Presenting Complaint? Yes, for Self-Harm  Method: No data recorded Availability of  Means: No data recorded Intent: No data recorded Notification Required: No data recorded Additional Information for Danger to Others Potential: No data recorded Additional Comments for Danger to Others Potential: No data recorded Are There Guns or Other Weapons in Your Home? No data recorded Types of Guns/Weapons: No data recorded Are These Weapons Safely Secured?                            No data recorded Who Could Verify You Are Able To Have These Secured: No data recorded Do You Have any Outstanding Charges, Pending Court Dates, Parole/Probation? No data recorded Contacted To Inform of Risk of Harm To Self or Others: Family/Significant Other:   Location of Assessment: [facility]   Does Patient Present under Involuntary Commitment? Yes  IVC  Papers Initial File Date: 07/19/20   [place] of Residence: [place]   Patient Currently Receiving the Following Services: Not Receiving Services   Determination of Need: Emergent (2 hours)   Options For Referral: Geropsychiatric Facility     CCA Biopsychosocial  Intake/Chief Complaint:  CCA Intake With Chief Complaint CCA Part Two Date: 07/19/20 CCA Part Two Time: 2143 Chief Complaint/Presenting Problem: Pt reports feeling depressed, anxious and frustrated. She feels family is after her money. Patient's Currently Reported Symptoms/Problems: Pt reports crying spells, anxiety, weight loss, irritability and feelings of hopelessness and worthlessness. Individual's Strengths: NA Individual's Preferences: Pt states she wants to return home. Individual's Abilities: Pt worked as an Therapist, sports. Type of Services Patient Feels Are Needed: None. Initial Clinical Notes/Concerns: NA  Mental Health Symptoms Depression:  Depression: Change in energy/activity, Difficulty Concentrating, Fatigue, Hopelessness, Increase/decrease in appetite, Irritability, Sleep (too much or little), Tearfulness, Weight gain/loss, Worthlessness, Duration of symptoms greater than two weeks  Mania:  Mania: Irritability  Anxiety:   Anxiety: Difficulty concentrating, Fatigue, Irritability, Restlessness, Sleep, Tension, Worrying  Psychosis:  Psychosis: None  Trauma:  Trauma: None  Obsessions:  Obsessions: None  Compulsions:  Compulsions: None  Inattention:  Inattention: None  Hyperactivity/Impulsivity:  Hyperactivity/Impulsivity: N/A  Oppositional/Defiant Behaviors:  Oppositional/Defiant Behaviors: None  Emotional Irregularity:  Emotional Irregularity: Intense/inappropriate anger  Other Mood/Personality Symptoms:      Mental Status Exam Appearance and self-care  Stature:  Stature: Average  Weight:  Weight: Average weight  Clothing:  Clothing: Casual  Grooming:  Grooming: Normal  Cosmetic use:  Cosmetic Use: None   Posture/gait:  Posture/Gait: Normal  Motor activity:  Motor Activity: Not Remarkable  Sensorium  Attention:  Attention: Normal  Concentration:  Concentration: Anxiety interferes  Orientation:  Orientation: Person, Place, Situation, Time  Recall/memory:  Recall/Memory: Normal  Affect and Mood  Affect:  Affect: Anxious, Tearful, Depressed  Mood:  Mood: Angry, Anxious, Dysphoric, Irritable, Negative, Pessimistic, Worthless  Relating  Eye contact:  Eye Contact: Normal  Facial expression:  Facial Expression: Depressed, Angry  Attitude toward examiner:  Attitude Toward Examiner: Cooperative, Irritable  Thought and Language  Speech flow: Speech Flow: Clear and Coherent  Thought content:  Thought Content: Appropriate to Mood and Circumstances  Preoccupation:  Preoccupations: None  Hallucinations:  Hallucinations: None  Organization:     Transport planner of Knowledge:  Fund of Knowledge: Average  Intelligence:  Intelligence: Average  Abstraction:  Abstraction: Normal  Judgement:  Judgement: Impaired  Reality Testing:  Reality Testing: Variable  Insight:  Insight: Denial  Decision Making:  Decision Making: Vacilates  Social Functioning  Social Maturity:  Social Maturity: Isolates  Social Judgement:  Social  Judgement: Normal  Stress  Stressors:  Stressors: Family conflict, Grief/losses, Illness  Coping Ability:  Coping Ability: English as a second language teacher Deficits:  Skill Deficits: Self-care, Activities of daily living  Supports:  Supports: Family     Religion: Religion/Spirituality Are You A Religious Person?: Yes What is Your Religious Affiliation?: International aid/development worker: Leisure / Recreation Do You Have Hobbies?: Yes Leisure and Hobbies: Environmental education officer, painting  Exercise/Diet: Exercise/Diet Do You Exercise?: No Have You Gained or Lost A Significant Amount of Weight in the Past Six Months?: Yes-Lost Number of Pounds Lost?:  (Unknown) Do You Follow a Special Diet?:  No Do You Have Any Trouble Sleeping?: Yes Explanation of Sleeping Difficulties: Interrupted sleep   CCA Employment/Education  Employment/Work Situation: Employment / Work Copywriter, advertising Employment situation: Retired Archivist job has been impacted by current illness: No Has patient ever been in the [employer]?: No  Education: Education Is Patient Currently Attending School?: No Did Teacher, adult education From [employer]?: Yes Did Physicist, medical?: Yes What Type of College Degree Do you Have?: BA Did Heritage manager?: No 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  Childhood History:  Childhood History Did patient suffer any verbal/emotional/physical/sexual abuse as a child?: No Did patient suffer from severe childhood neglect?: No Has patient ever been sexually abused/assaulted/raped as an adolescent or adult?: No Was the patient ever a victim of a crime or a disaster?: No Witnessed domestic violence?: No Has patient been affected by domestic violence as an adult?: No  Child/Adolescent Assessment:     CCA Substance Use  Alcohol/Drug Use: Alcohol / Drug Use Pain Medications: Denies abuse Prescriptions: Denies abuse Over the Counter: Denies abuse History of alcohol / drug use?: No history of alcohol / drug abuse Longest period of sobriety (when/how long): NA                         ASAM's:  Six Dimensions of Multidimensional  Assessment  Dimension 1:  Acute Intoxication and/or Withdrawal Potential:      Dimension 2:  Biomedical Conditions and Complications:      Dimension 3:  Emotional, Behavioral, or Cognitive Conditions and Complications:     Dimension 4:  Readiness to Change:     Dimension 5:  Relapse, Continued use, or Continued Problem Potential:     Dimension 6:  Recovery/Living Environment:     ASAM Severity Score:    ASAM Recommended Level of Treatment:     Substance use Disorder (SUD)    Recommendations for Services/Supports/Treatments:    DSM5 Diagnoses: Patient Active Problem List   Diagnosis Date Noted  . Open fracture of right zygomatic arch ([place])   . Palliative care by specialist   . DNR (do not resuscitate)   . Malnutrition of moderate degree 10/02/2019  . Pressure injury of skin 10/01/2019  . Malnutrition ([place]) 10/01/2019  . Hypothyroid 10/01/2019  . Failure to thrive in adult 09/30/2019  . Pleural effusion, bilateral 09/30/2019  . Chronic anticoagulation 09/30/2019  .  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 Gastric outlet obstruction 08/29/2019  . PAF (paroxysmal atrial fibrillation) ([place]) 02/02/2018  . 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 Cerebrovascular accident (CVA) due to embolism of left posterior cerebral artery ([place]) 03/09/2017  . htn 03/09/2017  . Generalized weakness 03/09/2017  . Stroke-like symptom 03/09/2017  . Right sided weakness   . PAD (peripheral artery disease) ([place])   . Essential hypertension   . Spinal stenosis of lumbar region   . Pure hypercholesterolemia   . Recurrent major depressive disorder [facility])     Patient Centered Plan: Patient is on the following Treatment Plan(s):  Anxiety and Depression   Referrals to Alternative Service(s): Referred to Alternative Service(s):   Place:   Date:   Time:    Referred to Alternative Service(s):   Place:   Date:   Time:    Referred to Alternative Service(s):   Place:   Date:   Time:    Referred to  Alternative Service(s):   Place:   Date:   Time:     Evelena Peat, [facility], [facility] Triage Specialist [phone]  Anson Fret, Orpah Greek

## 2020-07-19 NOTE — ED Notes (Signed)
TTS consult in progress. °

## 2020-07-19 NOTE — ED Triage Notes (Signed)
Patient was brought in by [facility] department. Patient's son took out IVC papers. IVC papers say that the patient has dementia and has manic episodes where she is ranting, raving, and screaming. Patient also stated that she was going to burn the house down and has been talking to people who are dead.

## 2020-07-19 NOTE — Progress Notes (Signed)
Per Caroline Sauger, NP patient meets gero-psych inpatient criteria.  Referrals made to the following facilities:    [facility]        [facility]        [facility]        CSW to follow up.  Lyndon Disposition, CSW [phone] (cell)

## 2020-07-19 NOTE — ED Notes (Signed)
Patient has been dress out and wanded by security.  Patient has two belonging bags in the cabinet in triage

## 2020-07-20 LAB — URINALYSIS, ROUTINE W REFLEX MICROSCOPIC
Bacteria, UA: NONE SEEN
Bilirubin Urine: NEGATIVE
Glucose, UA: NEGATIVE mg/dL
Hgb urine dipstick: NEGATIVE
Ketones, ur: 5 mg/dL — AB
Nitrite: NEGATIVE
Protein, ur: NEGATIVE mg/dL
Specific Gravity, Urine: 1.023 (ref 1.005–1.030)
WBC, UA: >50 WBC/hpf — ABNORMAL HIGH (ref 0–5)
pH: 5 (ref 5.0–8.0)

## 2020-07-20 LAB — RAPID URINE DRUG SCREEN, HOSP PERFORMED
Amphetamines: NOT DETECTED
Barbiturates: NOT DETECTED
Benzodiazepines: POSITIVE — AB
Cocaine: NOT DETECTED
Opiates: NOT DETECTED
Tetrahydrocannabinol: NOT DETECTED

## 2020-07-20 LAB — T4: T4, Total: 9 ug/dL (ref 4.5–12.0)

## 2020-07-20 MED ORDER — DIVALPROEX SODIUM 125 MG PO CSDR
125.0000 mg | DELAYED_RELEASE_CAPSULE | Freq: Every day | ORAL | Status: DC
  Administered 2020-07-22 – 2020-07-29 (×8): 125 mg via ORAL
  Filled 2020-07-20 (×9): qty 1

## 2020-07-20 MED ORDER — DIVALPROEX SODIUM 125 MG PO CSDR
250.0000 mg | DELAYED_RELEASE_CAPSULE | Freq: Every day | ORAL | Status: DC
  Administered 2020-07-21 – 2020-07-28 (×8): 250 mg via ORAL
  Filled 2020-07-20 (×9): qty 2

## 2020-07-20 NOTE — BHH Counselor (Signed)
[facility] [phone]) is reviewing the patient for possible placement.

## 2020-07-20 NOTE — Care Management (Signed)
[facility] declined due to the patient needing assistance with her ADL.

## 2020-07-20 NOTE — Consult Note (Signed)
Telepsych Consultation   Reason for Consult:  Psych consult Referring Physician:  EDP Location of Patient: [facility] Location of Provider: Other: [place]  Patient Identification: Alyssa Odonnell MRN:  [id number] Principal Diagnosis: <principal problem not specified> Diagnosis:  Active Problems:   * No active hospital problems. *   Total Time spent with patient: 30 minutes  Subjective:   Alyssa Odonnell is a 86 y.o. female patient admitted with mood instability, worsening confusion, and behavioral disturbances. She is alert and oriented at this time, although forgetful at times. Today she is cooperative yet irritable. She reports she has been doing well so that she can go home. SHe reports eating her food and drinking plenty of water. She also reports sleeping well last night. She also inquires about discharge, noting that her sons are trying to take over her. "they are ungrateful and they dont do anything for me. I have them living in these big homes that I built for them to try to throw me in a facility. " She denies active suicidal ideations however does endorse some passive suicidal thoughts. " I do feel like dying, but Im not going to kill myself. I have lost too much already, like my two daughters. " She is able to contract for safety.   HPI:   Pt is a 86 year old widowed female who presents unaccompanied to [facility] via Event [facility] after being petitioned for involuntary commitment by her son, Alyssa Odonnell [phone]. Affidavit and petition states: "Petitioner states: The respondent has been diagnosed with dementia. Recently, the respondent has been saying, "I just want to die." The respondent will go through manic epsiodes where she is ranting, raving and screaming. For instance today she got up cursing and yelling at everyone and she would not calm down. The neighbors resorted to driving her around in a golf cart around the neighborhood just to calm her down. The respondent says she  hates everything and she is going to burn the house down. The respondent talks about she is going to see people who have been dead for years."  Pt says she was brought to [facility] "because my family got mad at me." She says her son and grandson want her put in a nursing home because they want her money and her property. Pt states she feels depressed, angry, and frustrated. "I get no enjoyment from anything." She says she feels anxious "all the time." She says she has difficulty sleeping, does not have an appetite and has been losing weight. She denies current plan to kill herself but says repeatedly makes statements such as "I don't care if I live or die" and "I would be better off dead." She denies current thoughts of harming others. She denies auditory or visual hallucinations. She denies use of alcohol or other substances.  Pt describes being profoundly unhappy. She says she is in a wheelchair and cannot care for her garden. She says people are concerned that she will fall but she does not care if she falls. She says her daughter died in 10/13/23 and she is grieving. She says she has three other sons. She says she has caregivers in the home all the time but she feels they are incompetent and she does not need them. She says her husband died of a heart attack 50 years ago. Pt says she has a history of a stroke and falls and has been in nursing facilities in the past. She denies any history of mental health treatment.  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 Past Psychiatric History: Dementia with behavioral disturbances  Risk to Self:   no Risk to Others:  no Prior Inpatient Therapy:   Denies Prior Outpatient Therapy:  Denies  Past Medical History:  Past Medical History:  Diagnosis Date  . Acid reflux   . Acquired hammer toe   . Adenomatous colon polyp   . Alopecia   . Arthralgia of pelvic region and thigh   . Bunion   . Callus   . Cataract   . Gastric outlet obstruction 08/2019  . Glaucoma   . H/O  acute bronchitis   . Hearing loss, sensorineural   . Hx of anaphylactic shock   . Hyperlipidemia   . Hypertension   . Hypothyroid   . Impingement syndrome of shoulder   . Incisional hernia, incarcerated   . Migraine headache   . Non-neoplastic nevus   . Osteoarthritis, knee   . PAF (paroxysmal atrial fibrillation) ([place])   . Plantar fat pad atrophy   . RLS (restless legs syndrome)   . Stroke ([place])   . Tinea corporis   . UTI (urinary tract infection)   . Vitamin D deficiency     Past Surgical History:  Procedure Laterality Date  . ABDOMINAL AORTOGRAM W/LOWER EXTREMITY Right 09/11/2019   Procedure: ABDOMINAL AORTOGRAM W/LOWER EXTREMITY;  Surgeon: Waynetta Sandy, MD;  Location: [place] [facility];  Service: Cardiovascular;  Laterality: Right;  . ABDOMINAL HERNIA REPAIR    . CHOLECYSTECTOMY    . COLONOSCOPY    . GASTROSTOMY N/A 09/01/2019   Procedure: INSERTION OF GASTROSTOMY TUBE;  Surgeon: Erroll Luna, MD;  Location: [place];  Service: General;  Laterality: N/A;  . hernia repair w/ mesh    . HIATAL HERNIA REPAIR    . LAPAROSCOPIC LYSIS OF ADHESIONS  09/01/2019   Procedure: Laparoscopic Lysis Of Adhesions;  Surgeon: Erroll Luna, MD;  Location: [place];  Service: General;;  . LAPAROSCOPIC NISSEN FUNDOPLICATION N/A 41/96/2229   Procedure: LAPAROSCOPIC, POSSIBLE OPEN, GASTROPEXY;  Surgeon: Erroll Luna, MD;  Location: [place];  Service: General;  Laterality: N/A;  . LOOP RECORDER INSERTION N/A 03/11/2017   Procedure: Loop Recorder Insertion;  Surgeon: Evans Lance, MD;  Location: [place] [facility];  Service: Cardiovascular;  Laterality: N/A;  . NEUROPLASTY / TRANSPOSITION MEDIAN NERVE AT CARPAL TUNNEL    . PERIPHERAL VASCULAR BALLOON ANGIOPLASTY Left 09/11/2019   Procedure: PERIPHERAL VASCULAR BALLOON ANGIOPLASTY;  Surgeon: Waynetta Sandy, MD;  Location: [place] [facility];  Service: Cardiovascular;  Laterality: Left;  TP trunk  . PERIPHERAL VASCULAR  CATHETERIZATION Right 07/06/2016   Procedure: Lower Extremity Angiography;  Surgeon: Angelia Mould, MD;  Location: [place] [facility];  Service: Cardiovascular;  Laterality: Right;  . PERIPHERAL VASCULAR CATHETERIZATION Right 07/06/2016   Procedure: Peripheral Vascular Intervention;  Surgeon: Angelia Mould, MD;  Location: [place] [facility];  Service: Cardiovascular;  Laterality: Right;  Superficial femoral  . PERIPHERAL VASCULAR INTERVENTION Left 09/11/2019   Procedure: PERIPHERAL VASCULAR INTERVENTION;  Surgeon: Waynetta Sandy, MD;  Location: [place] [facility];  Service: Cardiovascular;  Laterality: Left;  SFA  . skin lesion excision cheek/face    . TEE WITHOUT CARDIOVERSION N/A 03/11/2017   Procedure: TRANSESOPHAGEAL ECHOCARDIOGRAM (TEE);  Surgeon: Acie Fredrickson Wonda Cheng, MD;  Location: [facility];  Service: Cardiovascular;  Laterality: N/A;  . TOTAL HIP ARTHROPLASTY    . TUBAL LIGATION     Family History:  Family History  Problem  Relation Age of Onset  . Osteoporosis Mother   . Hypertension Mother   . Heart failure Mother   . Stroke Father   . Heart disease Father   . Heart attack Father   . Cataracts Brother   . Colon cancer Paternal Aunt   . Glaucoma Paternal Aunt   . Dementia Neg Hx   . Alzheimer's disease Neg Hx    Family Psychiatric  History: Denies Social History:  Social History   Substance and Sexual Activity  Alcohol Use Never     Social History   Substance and Sexual Activity  Drug Use Never    Social History   Socioeconomic History  . Marital status: Widowed    Spouse name: Not on file  . Number of children: Not on file  . Years of education: Not on file  . Highest education level: Not on file  Occupational History  . Not on file  Tobacco Use  . Smoking status: Never Smoker  . Smokeless tobacco: Never Used  Vaping Use  . Vaping Use: Never used  Substance and Sexual Activity  . Alcohol use: Never  . Drug use: Never  . Sexual  activity: Not on file  Other Topics Concern  . Not on file  Social History Narrative   Lives at home alone, has caregivers 24/7   Caffeine: 1 cup coffee per day   Social Determinants of Health   Financial Resource Strain:   . Difficulty of Paying Living Expenses: Not on file  Food Insecurity:   . Worried About Charity fundraiser in the Last Year: Not on file  . Ran Out of Food in the Last Year: Not on file  Transportation Needs:   . Lack of Transportation (Medical): Not on file  . Lack of Transportation (Non-Medical): Not on file  Physical Activity:   . Days of Exercise per Week: Not on file  . Minutes of Exercise per Session: Not on file  Stress:   . Feeling of Stress : Not on file  Social Connections:   . Frequency of Communication with Friends and Family: Not on file  . Frequency of Social Gatherings with Friends and Family: Not on file  . Attends Religious Services: Not on file  . Active Member of Clubs or Organizations: Not on file  . Attends Archivist Meetings: Not on file  . Marital Status: Not on file   Additional Social History:    Allergies:   Allergies  Allergen Reactions  . Aspirin Other (See Comments)    Red splotches  . Atorvastatin Other (See Comments)    Muscle spams  . Nabumetone Itching  . Nsaids Other (See Comments)    Unknown reaction  . Pravastatin Other (See Comments)    Stiff,  walking difficulty   . Procaine Swelling  . Rosuvastatin Nausea Only and Other (See Comments)    Reflux and Muscle stiffness  . Simvastatin Nausea Only and Other (See Comments)    Reflux and Muscle stiffness  . Statins Other (See Comments)    Difficulty walking, muscle spasms, reflux, muscle stiffness  . Evolocumab Rash  . Mobic [Meloxicam] Rash  . Penicillins Rash    Has patient had a PCN reaction causing immediate rash, facial/tongue/throat swelling, SOB or lightheadedness with hypotension: Yes Has patient had a PCN reaction causing severe rash  involving mucus membranes or skin necrosis: No Has patient had a PCN reaction that required hospitalization: No Has patient had a PCN reaction occurring within  the last 10 years: No If all of the above answers are "NO", then may proceed with Cephalosporin use.      Labs:  Results for orders placed or performed during the hospital encounter of 07/19/20 (from the past 48 hour(s))  CBC with Differential/Platelet     Status: None   Collection Time: 07/19/20  3:23 PM  Result Value Ref Range   WBC 7.2 4.0 - 10.5 K/uL   RBC 4.36 3.87 - 5.11 MIL/uL   Hemoglobin 13.1 12.0 - 15.0 g/dL   HCT 40.6 36 - 46 %   MCV 93.1 80.0 - 100.0 fL   MCH 30.0 26.0 - 34.0 pg   MCHC 32.3 30.0 - 36.0 g/dL   RDW 13.2 11.5 - 15.5 %   Platelets 206 150 - 400 K/uL   nRBC 0.0 0.0 - 0.2 %   Neutrophils Relative % 62 %   Neutro Abs 4.4 1.7 - 7.7 K/uL   Lymphocytes Relative 29 %   Lymphs Abs 2.0 0.7 - 4.0 K/uL   Monocytes Relative 6 %   Monocytes Absolute 0.5 0 - 1 K/uL   Eosinophils Relative 2 %   Eosinophils Absolute 0.2 0 - 0 K/uL   Basophils Relative 1 %   Basophils Absolute 0.0 0 - 0 K/uL   Immature Granulocytes 0 %   Abs Immature Granulocytes 0.03 0.00 - 0.07 K/uL    Comment: Performed at [facility], [place] [address] [place], [place] [address]  Comprehensive metabolic panel     Status: Abnormal   Collection Time: 07/19/20  3:23 PM  Result Value Ref Range   Sodium 142 135 - 145 mmol/L   Potassium 3.7 3.5 - 5.1 mmol/L   Chloride 108 98 - 111 mmol/L   CO2 23 22 - 32 mmol/L   Glucose, Bld 99 70 - 99 mg/dL    Comment: Glucose reference range applies only to samples taken after fasting for at least 8 hours.   BUN 17 8 - 23 mg/dL   Creatinine, Ser 1.38 (H) 0.44 - 1.00 mg/dL   Calcium 9.3 8.9 - 10.3 mg/dL   Total Protein 7.3 6.5 - 8.1 g/dL   Albumin 4.4 3.5 - 5.0 g/dL   AST 16 15 - 41 U/L   ALT 15 0 - 44 U/L   Alkaline Phosphatase 78 38 - 126 U/L   Total Bilirubin 0.7 0.3 - 1.2 mg/dL    GFR calc non Af Amer 35 (L) >60 mL/min   GFR calc Af Amer 41 (L) >60 mL/min   Anion gap 11 5 - 15    Comment: Performed at [facility], [place] [address], [place], [place] [address]  Ethanol     Status: None   Collection Time: 07/19/20  3:23 PM  Result Value Ref Range   Alcohol, Ethyl (B) <10 <10 mg/dL    Comment: (NOTE) Lowest detectable limit for serum alcohol is 10 mg/dL.  For medical purposes only. Performed at [facility], [place] [address] [place], [place] [address]   TSH     Status: None   Collection Time: 07/19/20  3:34 PM  Result Value Ref Range   TSH 1.994 0.350 - 4.500 uIU/mL    Comment: Performed by a 3rd Generation assay with a functional sensitivity of <=0.01 uIU/mL. Performed at [facility], [place] [address] [place] [address] [place], [place] [address]   T4     Status: None   Collection Time: 07/19/20  3:34 PM  Result Value Ref Range   T4, Total 9.0 4.5 - 12.0 ug/dL    Comment: (NOTE) Performed At: [facility] [place], [place] [id number] Rush Farmer MD [id number]   Valproic acid level     Status: Abnormal   Collection Time: 07/19/20  3:35 PM  Result Value Ref Range   Valproic Acid Lvl 36 (L) 50.0 - 100.0 ug/mL    Comment: Performed at [facility], [place] [address] [place], [place] [address]  Rapid urine drug screen (hospital performed)     Status: Abnormal   Collection Time: 07/19/20  6:33 PM  Result Value Ref Range   Opiates NONE DETECTED NONE DETECTED   Cocaine NONE DETECTED NONE DETECTED   Benzodiazepines POSITIVE (A) NONE DETECTED   Amphetamines NONE DETECTED NONE DETECTED   Tetrahydrocannabinol NONE DETECTED NONE DETECTED   Barbiturates NONE DETECTED NONE DETECTED    Comment: (NOTE) DRUG SCREEN FOR MEDICAL PURPOSES ONLY.  IF CONFIRMATION IS NEEDED FOR ANY PURPOSE, NOTIFY LAB WITHIN 5 DAYS.  LOWEST DETECTABLE LIMITS FOR URINE DRUG SCREEN Drug Class                      Cutoff (ng/mL) Amphetamine and metabolites    1000 Barbiturate and metabolites    200 Benzodiazepine                 308 Tricyclics and metabolites     300 Opiates and metabolites        300 Cocaine and metabolites        300 THC                            50 Performed at [facility], [place] [address], [place], [place] [address]   Respiratory Panel by RT PCR (Flu A&B, Covid) - Nasopharyngeal Swab     Status: None   Collection Time: 07/19/20  6:34 PM   Specimen: Nasopharyngeal Swab  Result Value Ref Range   SARS Coronavirus 2 by RT PCR NEGATIVE NEGATIVE    Comment: (NOTE) SARS-CoV-2 target nucleic acids are NOT DETECTED.  The SARS-CoV-2 RNA is generally detectable in upper respiratoy specimens during the acute phase of infection. The lowest concentration of SARS-CoV-2 viral copies this assay can detect is 131 copies/mL. A negative result does not preclude SARS-Cov-2 infection and should not be used as the sole basis for treatment or other patient management decisions. A negative result may occur with  improper specimen collection/handling, submission of specimen other than nasopharyngeal swab, presence of viral mutation(s) within the areas targeted by this assay, and inadequate number of viral copies (<131 copies/mL). A negative result must be combined with clinical observations, patient history, and epidemiological information. The expected result is Negative.  Fact Sheet for Patients:  PinkCheek.be  Fact Sheet for Healthcare Providers:  GravelBags.it  This test is no t yet approved or cleared by the [place] FDA and  has been authorized for detection and/or diagnosis of SARS-CoV-2 by FDA under an Emergency Use Authorization (EUA). This EUA will remain  in effect (meaning this test can be used) for the duration of the COVID-19 declaration under Section 564(b)(1) of the Act, 21  U.S.C. section 360bbb-3(b)(1), unless the authorization is terminated or revoked sooner.     Influenza A by PCR NEGATIVE NEGATIVE   Influenza B by PCR NEGATIVE NEGATIVE    Comment: (NOTE) The Xpert Xpress SARS-CoV-2/FLU/RSV assay is  intended as an aid in  the diagnosis of influenza from Nasopharyngeal swab specimens and  should not be used as a sole basis for treatment. Nasal washings and  aspirates are unacceptable for Xpert Xpress SARS-CoV-2/FLU/RSV  testing.  Fact Sheet for Patients: PinkCheek.be  Fact Sheet for Healthcare Providers: GravelBags.it  This test is not yet approved or cleared by the [place] FDA and  has been authorized for detection and/or diagnosis of SARS-CoV-2 by  FDA under an Emergency Use Authorization (EUA). This EUA will remain  in effect (meaning this test can be used) for the duration of the  Covid-19 declaration under Section 564(b)(1) of the Act, 21  U.S.C. section 360bbb-3(b)(1), unless the authorization is  terminated or revoked. Performed at [facility], [place] [address] [place], [place] [address]   Urinalysis, Routine w reflex microscopic Urine, Clean Catch     Status: Abnormal   Collection Time: 07/19/20  6:34 PM  Result Value Ref Range   Color, Urine YELLOW YELLOW   APPearance CLEAR CLEAR   Specific Gravity, Urine 1.023 1.005 - 1.030   pH 5.0 5.0 - 8.0   Glucose, UA NEGATIVE NEGATIVE mg/dL   Hgb urine dipstick NEGATIVE NEGATIVE   Bilirubin Urine NEGATIVE NEGATIVE   Ketones, ur 5 (A) NEGATIVE mg/dL   Protein, ur NEGATIVE NEGATIVE mg/dL   Nitrite NEGATIVE NEGATIVE   Leukocytes,Ua MODERATE (A) NEGATIVE   RBC / HPF 0-5 0 - 5 RBC/hpf   WBC, UA >50 (H) 0 - 5 WBC/hpf   Bacteria, UA NONE SEEN NONE SEEN   Squamous Epithelial / LPF 0-5 0 - 5   Mucus PRESENT    Ca Oxalate Crys, UA PRESENT     Comment: Performed at [facility], [place] [address] [place], [place] [address]    Medications:  Current Facility-Administered Medications  Medication Dose Route Frequency Provider Last Rate Last Admin  . apixaban (ELIQUIS) tablet 5 mg  5 mg Oral BID [name], MD   5 mg at 07/20/20 0647  . clopidogrel (PLAVIX) tablet 75 mg  75 mg Oral Daily [name], MD   75 mg at 07/20/20 1307  . divalproex (DEPAKOTE SPRINKLE) capsule 125 mg  125 mg Oral BID [name], MD   125 mg at 07/20/20 0647  . escitalopram (LEXAPRO) tablet 20 mg  20 mg Oral Daily [name], MD   20 mg at 07/20/20 0648  . latanoprost (XALATAN) 0.005 % ophthalmic solution 1 drop  1 drop Both Eyes QHS [name], MD   1 drop at 07/20/20 0656  . levothyroxine (SYNTHROID) tablet 100 mcg  100 mcg Oral QAC breakfast [name], MD      . LORazepam (ATIVAN) tablet 0.5 mg  0.5 mg Oral BID [name], MD   0.5 mg at 07/20/20 0648  . multivitamin with minerals tablet 1 tablet  1 tablet Oral Daily [name], MD   1 tablet at 07/20/20 0647  . pantoprazole (PROTONIX) EC tablet 40 mg  40 mg Oral Daily [name], MD   40 mg at 07/20/20 2585   Current Outpatient Medications  Medication Sig Dispense Refill  . clopidogrel (PLAVIX) 75 MG tablet Take 75 mg by mouth daily.    . divalproex (DEPAKOTE SPRINKLE) 125 MG capsule Take 1 capsule (125 mg total) by mouth 2 (two) times daily. 60 capsule 3  . ELIQUIS 5 MG TABS tablet TAKE 1 TABLET BY MOUTH TWO  TIMES DAILY (Patient taking differently: Take 5  mg by mouth 2 (two) times daily. ) 60 tablet 0  . escitalopram (LEXAPRO) 20 MG tablet Take 20 mg by mouth daily.    Marland Kitchen levothyroxine (SYNTHROID, LEVOTHROID) 100 MCG tablet Take 100 mcg by mouth daily before breakfast.     . LORazepam (ATIVAN) 0.5 MG tablet Take 0.5 mg by mouth 2 (two) times daily. 12pm and bedtime    . Multiple Vitamin (MULTIVITAMIN WITH MINERALS) TABS tablet Take 1 tablet by mouth daily.    . pantoprazole (PROTONIX) 40 MG tablet  Take 40 mg by mouth daily.    . traMADol (ULTRAM) 50 MG tablet Take 50 mg by mouth 2 (two) times daily as needed for moderate pain or severe pain.     Marland Kitchen amiodarone (PACERONE) 200 MG tablet Take 1 tablet (200 mg total) by mouth daily. 30 tablet 0    Musculoskeletal: Strength & Muscle Tone: within normal limits Gait & Station: normal Patient leans: N/A  Psychiatric Specialty Exam: Physical Exam  Review of Systems  Blood pressure (!) 163/74, pulse 65, temperature 98 F (36.7 C), temperature source Oral, resp. rate 16, height 5\' 4"  (1.626 m), weight 72.6 kg, SpO2 99 %.Body mass index is 27.46 kg/m.  General Appearance: Disheveled  Eye Contact:  Fair  Speech:  Clear and Coherent and Normal Rate  Volume:  Normal  Mood:  Anxious, Depressed and Irritable  Affect:  Appropriate, Congruent and Labile  Thought Process:  Coherent, Linear and Descriptions of Associations: Intact  Orientation:  Full (Time, Place, and Person)  Thought Content:  Logical  Suicidal Thoughts:  No  Homicidal Thoughts:  No  Memory:  Immediate;   Fair Recent;   Fair  Judgement:  Fair  Insight:  Fair  Psychomotor Activity:  Normal  Concentration:  Concentration: Fair and Attention Span: Fair  Recall:  [employer] of Knowledge:  Fair  Language:  Fair  Akathisia:  No  Handed:  Right  AIMS (if indicated):     Assets:  Communication Skills Desire for Improvement Financial Resources/Insurance Housing Leisure Time Physical Health Social Support Vocational/Educational  ADL's:  Intact  Cognition:  WNL  Sleep:        Treatment Plan Summary: Daily contact with patient to assess and evaluate symptoms and progress in treatment and Medication management Continue to recommend inpatient geriatric facility.  Will continue her current medications as described.  Last valproic Acid level was 36, subtherapeutic. WIll increase depakote sprinkle 125mg  po daily then increase depakote 250mg  po qhs. Lab ordered for valproic  acid level on 07/23/2020.  Disposition: Recommend psychiatric Inpatient admission when medically cleared.  This service was provided via telemedicine using a 2-way, interactive audio and video technology.  Names of all persons participating in this telemedicine service and their role in this encounter. Name: Sheran Fava Role: PMHNP-BC  Name: Glendora Score Role: RN    Suella Broad, FNP 07/20/2020 1:52 PM

## 2020-07-21 NOTE — ED Notes (Signed)
Patient sleeping/rise and fall of chest breathing observed 

## 2020-07-21 NOTE — ED Notes (Signed)
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

## 2020-07-21 NOTE — ED Notes (Signed)
Pt observed being assisted with toileting by nurse tech. Pt now in bed and resting comfortably

## 2020-07-21 NOTE — ED Notes (Signed)
Pt is angry, labile, tearful. She cussed her son out on the telephone. Not understanding her situation.

## 2020-07-21 NOTE — ED Provider Notes (Signed)
Emergency Medicine Observation Re-evaluation Note  Alyssa Odonnell is a 86 y.o. female, seen on rounds today.  Pt initially presented to the ED for complaints of IVC and Dementia Currently, the patient is sitting on the side of the bed, tearfull.  Physical Exam  BP (!) 162/91 (BP Location: Left Arm)   Pulse 85   Temp 98.5 F (36.9 C) (Oral)   Resp 16   Ht 5\' 4"  (1.626 m)   Wt 72.6 kg   SpO2 98%   BMI 27.46 kg/m  Physical Exam General: No acute distress Cardiac: normal rate Lungs: no increased WOB, no abnormal lung sounds Psych: tearful, says everyone is mean to her  ED Course / MDM  EKG:EKG Interpretation  Date/Time:  Friday July 19 2020 18:40:14 EDT Ventricular Rate:  78 PR Interval:    QRS Duration: 81 QT Interval:  412 QTC Calculation: 470 R Axis:   33 Text Interpretation: Sinus rhythm Baseline wander in lead(s) I II aVR 12 Lead; [place] similar to April 2021 Confirmed by Sherwood Gambler [phone]) on 07/20/2020 5:08:51 AM    I have reviewed the labs performed to date as well as medications administered while in observation.  Recent changes in the last 24 hours include none.  Plan  Current plan is for awaiting placement. Patient is under full IVC at this time.  IVC has expired and will see if [facility] needs this to be reenacted.   Malvin Johns, MD 07/21/20 [phone]

## 2020-07-21 NOTE — ED Notes (Signed)
Pt fusses at everyone and cries frequently

## 2020-07-21 NOTE — BH Assessment (Signed)
Merlyn Lot, NP, continues to recommend inpatient treatment. Referrals re-faxed to the following hospitals for consideration of bed placement.  [facility]  [facility]  [facility]  [place]

## 2020-07-21 NOTE — ED Notes (Signed)
Refused meds. She said, "They don't do any good: You can throw them all in the sink. Except the Ativan, I'll take that."

## 2020-07-21 NOTE — ED Notes (Signed)
Patient resting.

## 2020-07-21 NOTE — ED Notes (Signed)
Patient awake received dinner tray

## 2020-07-22 DIAGNOSIS — F0391 Unspecified dementia with behavioral disturbance: Secondary | ICD-10-CM | POA: Diagnosis not present

## 2020-07-22 DIAGNOSIS — F03918 Unspecified dementia, unspecified severity, with other behavioral disturbance: Secondary | ICD-10-CM | POA: Insufficient documentation

## 2020-07-22 DIAGNOSIS — F4321 Adjustment disorder with depressed mood: Secondary | ICD-10-CM | POA: Diagnosis present

## 2020-07-22 NOTE — BH Assessment (Addendum)
Alyssa Odonnell Assessment Progress Note  Per Shuvon Rankin, NP, this pt does not require psychiatric hospitalization at this time.  Pt presents under IVC initiated by her son, Alyssa Odonnell, on 07/19/2020, however, this expired on 07/20/2020 with no First Examination having been completed.  Pt is therefore currently under voluntary status.  Pt is psychiatrically cleared.  Discharge instructions include referral information for area psychiatrists and therapists.  A social work consult has been ordered to facilitate pt's return to the community.  In reviewing pt's EPIC record, this Probation officer finds an Pharmacist, hospital stating that the petitioner is her POA, however, it does not specify whether he is healthcare POA, and a copy of the advance directive is not found in the pt's chart.  EDP Madalyn Rob, MD has been notified of disposition and he agrees to speak with pt to determine whether she currently has capacity to decide whether family may be contacted.  Pt's nurse, Nena Jordan, has also been notified.  She agrees to ask pt for consent to contact family.  Jalene Mullet, MA Triage Specialist [phone]    Addendum:  Per Dr Roslynn Amble, pt does currently have capacity to make this decision.  She further more has granted verbal consent to contact her son, Alyssa Odonnell [phone]), to discuss disposition.  At 13:11 I spoke to him, informing him that pt does not currently present a danger to herself or others and that she will be discharged from [facility].  He agrees to provide [place] with a copy of pt's power of attorney, but will need to bring it to [place] in person.  I informed him that Ricquita Tarpley-Carter, CSW will be contacting him about pt's return to the community.  Ricquita, Dr Roslynn Amble, and Nena Jordan have been notified.  Jalene Mullet, [place] Coordinator [phone]

## 2020-07-22 NOTE — ED Provider Notes (Addendum)
Emergency Medicine Observation Re-evaluation Note  Alyssa Odonnell is a 86 y.o. female, seen on rounds today.  Pt initially presented to the ED for complaints of IVC and Dementia Currently, the patient is resting comfortably.  Physical Exam  BP (!) 157/81 (BP Location: Left Arm)   Pulse 69   Temp 97.8 F (36.6 C) (Oral)   Resp 15   Ht 5\' 4"  (1.626 m)   Wt 72.6 kg   SpO2 97%   BMI 27.46 kg/m  Physical Exam General: well appearing in no distress Cardiac: warm and well perfused Lungs: no increased wob, even and unlabored Psych: calm and cooperative  ED Course / MDM  EKG:EKG Interpretation  Date/Time:  Friday July 19 2020 18:40:14 EDT Ventricular Rate:  78 PR Interval:    QRS Duration: 81 QT Interval:  412 QTC Calculation: 470 R Axis:   33 Text Interpretation: Sinus rhythm Baseline wander in lead(s) I II aVR 12 Lead; [place] similar to April 2021 Confirmed by Alyssa Odonnell [phone]) on 07/20/2020 5:08:51 AM    I have reviewed the labs performed to date as well as medications administered while in observation.  Recent changes in the last 24 hours include psych working on placement.  Plan  Current plan is for in patient placement per psych. Patient is under full IVC at this time.   Alyssa Starch, MD 07/22/20 0726   ADDENDUM 1:04 PM 07/22/20 Discussed cas with Alyssa Odonnell with Psych team. She has been psych cleared. The first exam was not completed within 24 hours so the IVC has expired and she is NOT under IVC. No further paperwork is required to release from original IVC. I reassessed patient. She is calm and cooperative, enjoying her lunch. She exhibits good insight into current condition. AAOX3. Believe she is safe for discharge and demonstrates competence at present time. She has provided verbal consent for our team to discuss her current social and medical situation with family. Alyssa Odonnell will reach out to CM/SW to help with [facility] resources.   Anticipate discharge later this  afternoon.    Alyssa Starch, MD 07/22/20 1309    Alyssa Starch, MD 07/22/20 1309

## 2020-07-22 NOTE — NC FL2 (Signed)
[place] LEVEL OF CARE SCREENING TOOL     IDENTIFICATION  Patient Name: Alyssa Odonnell Birthdate: 1934/09/04 Sex: female Admission Date (Current Location): 07/19/2020  [facility] and [place] Number:  Herbalist and Address:  [facility],  [place] [address] [place]      Provider Number: [id number]  Attending Physician Name and Address:  Default, Provider, MD  Relative Name and Phone Number:       Current Level of Care: Hospital Recommended Level of Care: [place], [place] Prior Approval Number:    Date Approved/Denied:   PASRR Number: [id number]  Discharge Plan: SNF    Current Diagnoses: Patient Active Problem List   Diagnosis Date Noted  . Adjustment disorder with depressed mood 07/22/2020  . Dementia with behavioral disturbance ([place])   . Open fracture of right zygomatic arch ([place])   . Palliative care by specialist   . DNR (do not resuscitate)   . Malnutrition of moderate degree 10/02/2019  . Pressure injury of skin 10/01/2019  . Malnutrition ([place]) 10/01/2019  . Hypothyroid 10/01/2019  . Failure to thrive in adult 09/30/2019  . Pleural effusion, bilateral 09/30/2019  . Chronic anticoagulation 09/30/2019  . 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 Gastric outlet obstruction 08/29/2019  . PAF (paroxysmal atrial fibrillation) ([place]) 02/02/2018  . 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[place]) 05/24/2017  . Cerebrovascular accident (CVA) due to embolism of left posterior cerebral artery ([place]) 03/09/2017  . htn 03/09/2017  . Generalized weakness 03/09/2017  . Stroke-like symptom 03/09/2017  . Right sided weakness   . PAD (peripheral artery disease) ([place])   . Essential hypertension   . Spinal stenosis of lumbar region   . Pure hypercholesterolemia   . Recurrent major depressive disorder (HCC)     Orientation RESPIRATION BLADDER Height & Weight     Self, Place, Time, Situation  Normal Incontinent Weight: 160 lb (72.6  kg) Height:  5\' 4"  (162.6 cm)  BEHAVIORAL SYMPTOMS/MOOD NEUROLOGICAL BOWEL NUTRITION STATUS      Incontinent Diet  AMBULATORY STATUS COMMUNICATION OF NEEDS Skin   Independent Verbally Normal                       Personal Care Assistance Level of Assistance  Bathing, Feeding, Dressing Bathing Assistance: Independent Feeding assistance: Independent Dressing Assistance: Independent     Functional Limitations Info  Sight, Hearing, Speech Sight Info: Adequate Hearing Info: Adequate Speech Info: Adequate    SPECIAL CARE FACTORS FREQUENCY                       Contractures Contractures Info: Not present    Additional Factors Info  Code Status, Allergies Code Status Info: Full Code Allergies Info: Aspirin, Atorvastatin, Nabumetone, Nsaids, Pravastatin, Procaine, Rosuvastatin,Simvastatin,Statins, Evolocumab,Mobic, and Penicillins.           Current Medications (07/22/2020):  This is the current hospital active medication list Current Facility-Administered Medications  Medication Dose Route Frequency Provider Last Rate Last Admin  . apixaban (ELIQUIS) tablet 5 mg  5 mg Oral BID [name], MD   5 mg at 07/22/20 1017  . clopidogrel (PLAVIX) tablet 75 mg  75 mg Oral Daily [name], MD   75 mg at 07/22/20 1016  . divalproex (DEPAKOTE SPRINKLE) capsule 125 mg  125 mg Oral Daily Suella Broad, FNP   125 mg at 07/22/20 1018  . divalproex (DEPAKOTE SPRINKLE) capsule 250 mg  250 mg Oral  QHS Suella Broad, FNP   250 mg at 07/21/20 2303  . escitalopram (LEXAPRO) tablet 20 mg  20 mg Oral Daily [name], MD   20 mg at 07/22/20 1018  . latanoprost (XALATAN) 0.005 % ophthalmic solution 1 drop  1 drop Both Eyes QHS [name], MD   1 drop at 07/20/20 0656  . levothyroxine (SYNTHROID) tablet 100 mcg  100 mcg Oral QAC breakfast [name], MD   100 mcg at 07/22/20 0816  . LORazepam (ATIVAN) tablet 0.5 mg  0.5 mg Oral BID  [name], MD   0.5 mg at 07/22/20 1019  . multivitamin with minerals tablet 1 tablet  1 tablet Oral Daily [name], MD   1 tablet at 07/22/20 1019  . pantoprazole (PROTONIX) EC tablet 40 mg  40 mg Oral Daily [name], MD   40 mg at 07/22/20 1019   Current Outpatient Medications  Medication Sig Dispense Refill  . clopidogrel (PLAVIX) 75 MG tablet Take 75 mg by mouth daily.    . divalproex (DEPAKOTE SPRINKLE) 125 MG capsule Take 1 capsule (125 mg total) by mouth 2 (two) times daily. 60 capsule 3  . ELIQUIS 5 MG TABS tablet TAKE 1 TABLET BY MOUTH TWO  TIMES DAILY (Patient taking differently: Take 5 mg by mouth 2 (two) times daily. ) 60 tablet 0  . escitalopram (LEXAPRO) 20 MG tablet Take 20 mg by mouth daily.    Marland Kitchen levothyroxine (SYNTHROID, LEVOTHROID) 100 MCG tablet Take 100 mcg by mouth daily before breakfast.     . LORazepam (ATIVAN) 0.5 MG tablet Take 0.5 mg by mouth 2 (two) times daily. 12pm and bedtime    . Multiple Vitamin (MULTIVITAMIN WITH MINERALS) TABS tablet Take 1 tablet by mouth daily.    . pantoprazole (PROTONIX) 40 MG tablet Take 40 mg by mouth daily.    . traMADol (ULTRAM) 50 MG tablet Take 50 mg by mouth 2 (two) times daily as needed for moderate pain or severe pain.     Marland Kitchen amiodarone (PACERONE) 200 MG tablet Take 1 tablet (200 mg total) by mouth daily. 30 tablet 0     Discharge Medications: Please see discharge summary for a list of discharge medications.  Relevant Imaging Results:  Relevant Lab Results:   Additional Information SSN#:  [id number]  [place] Journey C Tarpley-Carter, LCSWA

## 2020-07-22 NOTE — Discharge Instructions (Signed)
For your behavioral health needs, you are advised to follow up with outpatient psychiatry and therapy.  Contact one of the providers listed below at your earliest opportunity to ask about scheduling an intake appointment:       [facility]. [address] [place], [place] [address]      [phone]       [address] [place], [place] [address]      [phone]       [facility]      [address], [place] [address]      [place], [place] [address]      [phone]

## 2020-07-22 NOTE — Progress Notes (Addendum)
2nd shift ED CSW received a handoff from the 1st shift [facility] ED CSW.   TOC is seeking placement and per 1st shift ED CSW pt's next step is sending referrals out to the community for SNF placement.  Referrals sent.  PT to see the pt in the morning (10/5).  Pt had a COVID test on 10/1.  2nd shift ED CSW will leave handoff for 1st shift ED CSW.  CSW will continue to follow for D/C needs.  [name]. Hoke Baer  MSW, LCSW, LCAS, CCS Transitions of Care Clinical Social Worker Care Coordination Department Ph: [phone]

## 2020-07-22 NOTE — Consult Note (Signed)
[facility] Psych ED Progress Note  07/22/2020 11:41 AM Alyssa Odonnell  MRN:  [id number]   Subjective:  "I'm really depressed because my family wants nothing to do with me.  My sister and daughter is dead and my son doesn't want me there anymore."  Psychiatric Consult Note: 07/22/2020:  Alyssa Odonnell, 86 y.o., female patient seen via tele psych by this provider, consulted with Dr. Dwyane Dee; and chart reviewed on 07/22/20.  On evaluation Alyssa Odonnell reports she continues to feel depressed related to her son not wanting her to live with him anymore and doesn't feel that she has any family that is supportive since the death of her daughter and her sister.  Patient states that she has never tried to kill herself and has no suicidal thoughts.  Patient states that she was probably brought to the hospital because "of a comment I made when I said that I don't care if I die, but I'm certainly not going to kill myself.  Spoke with patients nurse who reports there has been no behavioral outburst since the patient has been in the hospital.   During evaluation Alyssa Odonnell is alert/oriented x 4; calm/cooperative; and mood is congruent with affect. She does not appear to be responding to internal/external stimuli or delusional thoughts.  Patient denies suicidal/self-harm/homicidal ideation, psychosis, and paranoia.  Patient answered question appropriately.  Will refer patient to social work     Principal Problem: Adjustment disorder with depressed mood Diagnosis:  Principal Problem:   Adjustment disorder with depressed mood  Total Time spent with patient: 30 minutes  Past Psychiatric History: Denies any prior psychiatric history  Past Medical History:  Past Medical History:  Diagnosis Date  . Acid reflux   . Acquired hammer toe   . Adenomatous colon polyp   . Alopecia   . Arthralgia of pelvic region and thigh   . Bunion   . Callus   . Cataract   . Gastric outlet obstruction 08/2019  . Glaucoma   . H/O  acute bronchitis   . Hearing loss, sensorineural   . Hx of anaphylactic shock   . Hyperlipidemia   . Hypertension   . Hypothyroid   . Impingement syndrome of shoulder   . Incisional hernia, incarcerated   . Migraine headache   . Non-neoplastic nevus   . Osteoarthritis, knee   . PAF (paroxysmal atrial fibrillation) ([place])   . Plantar fat pad atrophy   . RLS (restless legs syndrome)   . Stroke ([place])   . Tinea corporis   . UTI (urinary tract infection)   . Vitamin D deficiency     Past Surgical History:  Procedure Laterality Date  . ABDOMINAL AORTOGRAM W/LOWER EXTREMITY Right 09/11/2019   Procedure: ABDOMINAL AORTOGRAM W/LOWER EXTREMITY;  Surgeon: Waynetta Sandy, MD;  Location: [place];  Service: Cardiovascular;  Laterality: Right;  . ABDOMINAL HERNIA REPAIR    . CHOLECYSTECTOMY    . COLONOSCOPY    . GASTROSTOMY N/A 09/01/2019   Procedure: INSERTION OF GASTROSTOMY TUBE;  Surgeon: Erroll Luna, MD;  Location: [facility];  Service: General;  Laterality: N/A;  . hernia repair w/ mesh    . HIATAL HERNIA REPAIR    . LAPAROSCOPIC LYSIS OF ADHESIONS  09/01/2019   Procedure: Laparoscopic Lysis Of Adhesions;  Surgeon: Erroll Luna, MD;  Location: [place];  Service: General;;  . LAPAROSCOPIC NISSEN FUNDOPLICATION N/A 89/38/1017   Procedure: LAPAROSCOPIC, POSSIBLE OPEN, GASTROPEXY;  Surgeon: Erroll Luna, MD;  Location:  [facility];  Service: General;  Laterality: N/A;  . LOOP RECORDER INSERTION N/A 03/11/2017   Procedure: Loop Recorder Insertion;  Surgeon: Evans Lance, MD;  Location: [place] [facility];  Service: Cardiovascular;  Laterality: N/A;  . NEUROPLASTY / TRANSPOSITION MEDIAN NERVE AT CARPAL TUNNEL    . PERIPHERAL VASCULAR BALLOON ANGIOPLASTY Left 09/11/2019   Procedure: PERIPHERAL VASCULAR BALLOON ANGIOPLASTY;  Surgeon: Waynetta Sandy, MD;  Location: [facility] [facility];  Service: Cardiovascular;  Laterality: Left;  TP trunk  . PERIPHERAL VASCULAR  CATHETERIZATION Right 07/06/2016   Procedure: Lower Extremity Angiography;  Surgeon: Angelia Mould, MD;  Location: [place];  Service: Cardiovascular;  Laterality: Right;  . PERIPHERAL VASCULAR CATHETERIZATION Right 07/06/2016   Procedure: Peripheral Vascular Intervention;  Surgeon: Angelia Mould, MD;  Location: [facility] [facility];  Service: Cardiovascular;  Laterality: Right;  Superficial femoral  . PERIPHERAL VASCULAR INTERVENTION Left 09/11/2019   Procedure: PERIPHERAL VASCULAR INTERVENTION;  Surgeon: Waynetta Sandy, MD;  Location: [place] [facility];  Service: Cardiovascular;  Laterality: Left;  SFA  . skin lesion excision cheek/face    . TEE WITHOUT CARDIOVERSION N/A 03/11/2017   Procedure: TRANSESOPHAGEAL ECHOCARDIOGRAM (TEE);  Surgeon: Acie Fredrickson Wonda Cheng, MD;  Location[facility];  Service: Cardiovascular;  Laterality: N/A;  . TOTAL HIP ARTHROPLASTY    . TUBAL LIGATION     Family History:  Family History  Problem Relation Age of Onset  . Osteoporosis Mother   . Hypertension Mother   . Heart failure Mother   . Stroke Father   . Heart disease Father   . Heart attack Father   . Cataracts Brother   . Colon cancer Paternal Aunt   . Glaucoma Paternal Aunt   . Dementia Neg Hx   . Alzheimer's disease Neg Hx    Family Psychiatric  History: Denies Social History:  Social History   Substance and Sexual Activity  Alcohol Use Never     Social History   Substance and Sexual Activity  Drug Use Never    Social History   Socioeconomic History  . Marital status: Widowed    Spouse name: Not on file  . Number of children: Not on file  . Years of education: Not on file  . Highest education level: Not on file  Occupational History  . Not on file  Tobacco Use  . Smoking status: Never Smoker  . Smokeless tobacco: Never Used  Vaping Use  . Vaping Use: Never used  Substance and Sexual Activity  . Alcohol use: Never  . Drug use: Never  . Sexual  activity: Not on file  Other Topics Concern  . Not on file  Social History Narrative   Lives at home alone, has caregivers 24/7   Caffeine: 1 cup coffee per day   Social Determinants of Health   Financial Resource Strain:   . Difficulty of Paying Living Expenses: Not on file  Food Insecurity:   . Worried About Charity fundraiser in the Last Year: Not on file  . Ran Out of Food in the Last Year: Not on file  Transportation Needs:   . Lack of Transportation (Medical): Not on file  . Lack of Transportation (Non-Medical): Not on file  Physical Activity:   . Days of Exercise per Week: Not on file  . Minutes of Exercise per Session: Not on file  Stress:   . Feeling of Stress : Not on file  Social Connections:   .  Frequency of Communication with Friends and Family: Not on file  . Frequency of Social Gatherings with Friends and Family: Not on file  . Attends Religious Services: Not on file  . Active Member of Clubs or Organizations: Not on file  . Attends Archivist Meetings: Not on file  . Marital Status: Not on file    Sleep: Good  Appetite:  Fair  Current Medications: Current Facility-Administered Medications  Medication Dose Route Frequency Provider Last Rate Last Admin  . apixaban (ELIQUIS) tablet 5 mg  5 mg Oral BID [name], MD   5 mg at 07/22/20 1017  . clopidogrel (PLAVIX) tablet 75 mg  75 mg Oral Daily [name], MD   75 mg at 07/22/20 1016  . divalproex (DEPAKOTE SPRINKLE) capsule 125 mg  125 mg Oral Daily Suella Broad, FNP   125 mg at 07/22/20 1018  . divalproex (DEPAKOTE SPRINKLE) capsule 250 mg  250 mg Oral QHS Suella Broad, FNP   250 mg at 07/21/20 2303  . escitalopram (LEXAPRO) tablet 20 mg  20 mg Oral Daily [name], MD   20 mg at 07/22/20 1018  . latanoprost (XALATAN) 0.005 % ophthalmic solution 1 drop  1 drop Both Eyes QHS [name], MD   1 drop at 07/20/20 0656  . levothyroxine (SYNTHROID) tablet  100 mcg  100 mcg Oral QAC breakfast [name], MD   100 mcg at 07/22/20 0816  . LORazepam (ATIVAN) tablet 0.5 mg  0.5 mg Oral BID [name], MD   0.5 mg at 07/22/20 1019  . multivitamin with minerals tablet 1 tablet  1 tablet Oral Daily [name], MD   1 tablet at 07/22/20 1019  . pantoprazole (PROTONIX) EC tablet 40 mg  40 mg Oral Daily [name], MD   40 mg at 07/22/20 1019   Current Outpatient Medications  Medication Sig Dispense Refill  . clopidogrel (PLAVIX) 75 MG tablet Take 75 mg by mouth daily.    . divalproex (DEPAKOTE SPRINKLE) 125 MG capsule Take 1 capsule (125 mg total) by mouth 2 (two) times daily. 60 capsule 3  . ELIQUIS 5 MG TABS tablet TAKE 1 TABLET BY MOUTH TWO  TIMES DAILY (Patient taking differently: Take 5 mg by mouth 2 (two) times daily. ) 60 tablet 0  . escitalopram (LEXAPRO) 20 MG tablet Take 20 mg by mouth daily.    Marland Kitchen levothyroxine (SYNTHROID, LEVOTHROID) 100 MCG tablet Take 100 mcg by mouth daily before breakfast.     . LORazepam (ATIVAN) 0.5 MG tablet Take 0.5 mg by mouth 2 (two) times daily. 12pm and bedtime    . Multiple Vitamin (MULTIVITAMIN WITH MINERALS) TABS tablet Take 1 tablet by mouth daily.    . pantoprazole (PROTONIX) 40 MG tablet Take 40 mg by mouth daily.    . traMADol (ULTRAM) 50 MG tablet Take 50 mg by mouth 2 (two) times daily as needed for moderate pain or severe pain.     Marland Kitchen amiodarone (PACERONE) 200 MG tablet Take 1 tablet (200 mg total) by mouth daily. 30 tablet 0    Lab Results: No results found for this or any previous visit (from the past 48 hour(s)).  Blood Alcohol level:  Lab Results  Component Value Date   ETH <10 07/19/2020    Physical Findings: AIMS:  , ,  ,  ,    CIWA:    COWS:     Musculoskeletal: Strength & Muscle Tone: Unable to assess via telepsych Gait &  Station: Did not see patient ambulate Patient leans: N/A  Psychiatric Specialty Exam: Physical Exam Vitals and nursing note reviewed.  Exam conducted with a chaperone present.  Constitutional:      Appearance: Normal appearance.  Neurological:     Mental Status: She is alert.  Psychiatric:        Attention and Perception: Attention and perception normal. She does not perceive auditory or visual hallucinations.        Mood and Affect: Affect normal. Mood is depressed.        Speech: Speech normal.        Behavior: Behavior normal. Behavior is cooperative.        Thought Content: Thought content normal. Thought content is not paranoid or delusional. Thought content does not include homicidal or suicidal ideation.        Cognition and Memory: Cognition and memory normal.        Judgment: Judgment normal.     Review of Systems  Psychiatric/Behavioral: Negative for agitation, behavioral problems, self-injury, sleep disturbance and suicidal ideas. The patient is not nervous/anxious.        Patient reporting depression stating that she lives with her son but he doesn't want her there anymore.  All other systems reviewed and are negative.   Blood pressure (!) 157/81, pulse 69, temperature 97.8 F (36.6 C), temperature source Oral, resp. rate 15, height 5\' 4"  (1.626 m), weight 72.6 kg, SpO2 97 %.Body mass index is 27.46 kg/m.  General Appearance: Casual  Eye Contact:  Good  Speech:  Clear and Coherent and Normal Rate  Volume:  Normal  Mood:  "Depressed"  Affect:  Congruent and Depressed  Thought Process:  Coherent, Goal Directed and Descriptions of Associations: Intact  Orientation:  Full (Time, Place, and Person)  Thought Content:  WDL  Suicidal Thoughts:  No  Homicidal Thoughts:  No  Memory:  Immediate;   Fair Recent;   Fair  Judgement:  Intact  Insight:  Fair  Psychomotor Activity:  Normal  Concentration:  Concentration: Fair and Attention Span: Fair  Recall:  [employer] of Knowledge:  Good  Language:  Good  Akathisia:  No  Handed:  Right  AIMS (if indicated):     Assets:  Communication Skills Desire for  Improvement Housing  ADL's:  Intact  Cognition:  WNL  Sleep:       Treatment Plan Summary: Plan Refer to social work  Disposition:  Psychiatrically cleared Patient does not meet criteria for psychiatric inpatient admission.  Anslie Spadafora, NP 07/22/2020, 11:41 AM

## 2020-07-22 NOTE — ED Notes (Signed)
Patient resting comfortably, sitter present, bed low,wheels locked 

## 2020-07-23 LAB — VALPROIC ACID LEVEL: Valproic Acid Lvl: 12 ug/mL — ABNORMAL LOW (ref 50.0–100.0)

## 2020-07-23 MED ORDER — ACETAMINOPHEN 500 MG PO TABS
1000.0000 mg | ORAL_TABLET | Freq: Once | ORAL | Status: AC
  Administered 2020-07-23: 1000 mg via ORAL
  Filled 2020-07-23: qty 2

## 2020-07-23 NOTE — ED Notes (Signed)
PT CURRENTLY SITTING ON THE SIDE OF THE BED.

## 2020-07-23 NOTE — Progress Notes (Addendum)
TOC CSW received a message from [place].  CSW gave Janett Billow a call.  Janett Billow requested a new FL2 that stated ALF, not SNF.  Janett Billow also asked for a [employer] with pt at [place] today.   1st shift CSW will handoff to 2nd shift CSW for [facility] meeting at [place].  Also, fax new FL2 signed by Dr. Billy Fischer and nurse notes through the hub to [place].  CSW will continue to follow for dc needs.  Mariafernanda Hendricksen Tarpley-Carter, MSW, LCSW-A Pronouns:  She, Her, Hers                  [place] ED Transitions of CareClinical Social Worker [email or web address] [phone]

## 2020-07-23 NOTE — ED Notes (Signed)
pt

## 2020-07-23 NOTE — Progress Notes (Signed)
TOC CSW contacted [employer] for authorization to SNF placement.  No SNF name was not given, left open due to low bed offers.  Reference #:  [id number]  Alantis Bethune Tarpley-Carter, MSW, LCSW-A Pronouns:  She, Her, Hers                  [facility] ED Transitions of CareClinical Social Worker [email or web address] [phone]

## 2020-07-23 NOTE — ED Notes (Signed)
LUNCH TRAY GIVEN. 

## 2020-07-23 NOTE — Progress Notes (Addendum)
CSW met with the pt and asked pt if she would be willing to speak to [place] at [place] and pt who had been asleep until she was awakened by the CSW provided verbal permission for the CSW to facilitate a Facetime call  Between the pt and Janett Billow.  CSW contacted Tyler Pita to attempt/facilitate a Facetime assessment between the pt and Janett Billow in order to assist the pt's family in seeking placement for the pt/pt's family.  When the call began the pt then asked what this was all about and the CSW repeated the above again and the pt stated she did not want to [facility] and when she asked again where [facility] was and was told it was in [place], pt became agitated and began using profanity and cursing stating she didn't want to go to _______[place] and she didn't want to talk to _______Jessica and that she just wanted to go home.  Pt stated, "They just want to spend my money and get rid of me, they tell people I'm hard to get along with because they want me out of my home".  Pt further stated, while in the presence of Jessica from [place] that she just wanted to go home, that she wasn't, "going to go to any [facility]" and she "didn't want to talk to any [place]".  Pt stated perfectly clearly and succinctly she would rather spend her money on caregivers in the home so she could stay at home than some hospital or nursing home and clearly stated she "would rather die at home spending her money at home then die in some hospital like this one".  Pt presented as sincere as upset but clear and concise and kept repeating, "Why do I have to be here, I just want to go home" and "I just want to go home, they keep telling me I will go home and I want to go home now".  Pt then stated "Estill Bamberg" a relative had twisted her arms and later stated to the pt she had fallen, but pt stated all she remembers was the pain she felt when her arms were twisted and does not remember a fall having taken  place.  CSW asked the pt if she would like to report this to the police and the pt stated no she would not stating, "They would never beleive me anyway".  Per notes, pt has been psychiatrically cleared as of 07/22/20 at 11:41 AM.  CSW will continue to follow for D/C needs.  [name]. Alyssa Odonnell  MSW, LCSW, LCAS, CCS Transitions of Care Clinical Social Worker Care Coordination Department Ph: [phone]

## 2020-07-23 NOTE — Progress Notes (Addendum)
EPD updated that pt is psychiatrically cleared.  CSW spoke to [facility] at [facility] in [place] who state that although pt's clinicals will be provided to the administration at [facility] for review, it is likely that the pt will be accepted to [facility], per [place] on 07/24/20 for [place] living.  Per Janett Billow, she is aware that pt is her own legal guardian but also states that pt's son has HCPOA and Legal POA and will provide proof of that to the facility on 07/24/20.  CSW will continue to follow for D/C needs.  [name]. Murray Durrell  MSW, LCSW, LCAS, CCS Transitions of Care Clinical Social Worker Care Coordination Department Ph: [phone]                                                                                                                            CSW will continue to follow for D/C needs.  [name]. Elenna Spratling  MSW, LCSW, LCAS, CCS Transitions of Care Clinical Social Worker Care Coordination Department Ph: [phone]

## 2020-07-23 NOTE — ED Notes (Signed)
Pt alert, calm, cooperative, no s/s of distress. Medication compliant this  AM .   Pt resting in bed comfortably . Sitter at bedside.

## 2020-07-23 NOTE — ED Notes (Signed)
Pt has been alert this shift. Cooperative. Pt unsteady, uses walker and stand by assist.  Pt upset and tearful, pt wants to go home.  Pt states can get ppl to help her at home.

## 2020-07-23 NOTE — Progress Notes (Signed)
2nd shift ED CSW received a handoff from the 1st shift [facility] ED CSW.    CSW contacted Tyler Pita X2 to attempt/facilitate a Facetime assessment between the pt and Janett Billow in order to assist the pt's family in seeking placement for the pt/pt's family.  CSW left a HIPPA-compliant message requesting a return call.  CSW will continue to follow for D/C needs.  [name]. Charletta Voight  MSW, LCSW, LCAS, CCS Transitions of Care Clinical Social Worker Care Coordination Department Ph: [phone]

## 2020-07-23 NOTE — Progress Notes (Signed)
CSW consulted with Disposition who confirmed pt does not have a legal guardian and who state resources were provided to pt's son on how to gain guardianship.  CSW will continue to follow for D/C needs.  [name]. Godwin Tedesco  MSW, LCSW, LCAS, CCS Transitions of Care Clinical Social Worker Care Coordination Department Ph: [phone]

## 2020-07-23 NOTE — ED Notes (Signed)
DINNER TRAY GIVEN °

## 2020-07-23 NOTE — Progress Notes (Signed)
CSW received a call from [facility] asking for pt's PT notes to determine pt's skilleable need.    CW to follow up with PT.  Can be faxed to [phone].  Langley Gauss at [place]  CSW will continue to follow for D/C needs.  [name]. Jese Comella  MSW, LCSW, LCAS, CCS Transitions of Care Clinical Social Worker Care Coordination Department Ph: [phone]

## 2020-07-23 NOTE — Evaluation (Signed)
Physical Therapy Evaluation Patient Details Name: Alyssa Odonnell MRN: [id number] DOB: Nov 17, 1933 Today's Date: 07/23/2020   History of Present Illness  Alyssa Odonnell is a 86 y.o. female patient admitted with mood instability, worsening confusion, and behavioral disturbances. Patient PMH significant for stroke, PAF, OA, UTI, HTN, HLD, HOH, glaucoma, cardiac loop recoder insertion.     Clinical Impression  Alyssa Odonnell is 86 y.o. female admitted with above HPI and diagnosis. Patient is currently limited by functional impairments below (see PT problem list). Patient lives alone and requires assist for ADL's at baseline. She previously had home aids available 24/7 but no longer has assist available. Patient currently requires min guard for bed mobility and sit<>stand transfers with RW. She requires cues for safe walker management with transfer and min assist to use RW and prevent LOB during short gait. Patient ambulated ~ 64' and completed toileting; pt required single UE support in standing to complete peri-care, able to do so with guarding for safety. Patient will benefit from continued skilled PT interventions to address impairments and progress independence with mobility, recommending SNF vs ALF for supervision/assist and to continue PT for balance and gait at next venue. Acute PT will follow and progress as able.     Follow Up Recommendations SNF (SNF vs ALF)    Equipment Recommendations  Rolling walker with 5" wheels    Recommendations for Other Services       Precautions / Restrictions Precautions Precautions: Fall Precaution Comments: pt's son reports history of falling Restrictions Weight Bearing Restrictions: No      Mobility  Bed Mobility Overal bed mobility: Needs Assistance Bed Mobility: Supine to Sit;Sit to Supine     Supine to sit: Min guard;HOB elevated Sit to supine: Min assist;HOB elevated   General bed mobility comments: cues to sit to EOB, pt slow and using bed  rail to sit up to EOB. Assist required to raise LE's back into bed.   Transfers Overall transfer level: Needs assistance Equipment used: Rolling walker (2 wheeled) Transfers: Sit to/from Stand Sit to Stand: Min guard;From elevated surface         General transfer comment: close guarding for safety with rising. pt able to complete power up without assist from EOB and toilet. cues needed for safe hand placement on RW.   Ambulation/Gait Ambulation/Gait assistance: Min guard;Min assist Gait Distance (Feet): 40 Feet Assistive device: Rolling walker (2 wheeled) Gait Pattern/deviations: Step-through pattern;Decreased step length - right;Decreased step length - left;Decreased stride length;Shuffle;Decreased dorsiflexion - right;Trunk flexed Gait velocity: decr   General Gait Details: pt with knees flexed and trunk flexed in standing. pt with decresed hip/knee flexion on Rt LE and sliding Rt foot on floor to advance gait. min assist required to steady intermittently and to manage walker position to avoid obstacles (doorway, sink, wheel of bed)  Stairs            Wheelchair Mobility    Modified Rankin (Stroke Patients Only)       Balance Overall balance assessment: Needs assistance;History of Falls Sitting-balance support: Feet supported Sitting balance-Leahy Scale: Fair     Standing balance support: During functional activity;Bilateral upper extremity supported Standing balance-Leahy Scale: Poor Standing balance comment: reliant on external support                             Pertinent Vitals/Pain Pain Assessment: Faces Faces Pain Scale: Hurts little more Pain Location: Rt knee Pain Descriptors /  Indicators: Discomfort Pain Intervention(s): Limited activity within patient's tolerance;Monitored during session;Repositioned    Home Living Family/patient expects to be discharged to:: Private residence Living Arrangements: Alone Available Help at Discharge:  Personal care attendant;Available 24 hours/day Type of Home: House Home Access: Stairs to enter     Home Layout: Two level;Able to live on main level with bedroom/bathroom Home Equipment: Gilford Rile - 2 wheels;Walker - 4 wheels;Bedside commode;Cane - single point;Wheelchair - manual Additional Comments: patient previously had 24/7 assist with 3 different home aides however no longer has any aides. family cannot provide 24/7 assist and require outside help.     Prior Function Level of Independence: Independent with assistive device(s);Needs assistance   Gait / Transfers Assistance Needed: pt reports using RW for mobility, she is limited by Rt knee pain.   ADL's / Homemaking Assistance Needed: pt has had 3 aides in to help with ADL's: bathing, dressing, cooking and cleaning.   Comments: Uses rollator indoors and outdoors, sometimes RW outdoors.     Hand Dominance   Dominant Hand: Right    Extremity/Trunk Assessment   Upper Extremity Assessment Upper Extremity Assessment: Generalized weakness    Lower Extremity Assessment Lower Extremity Assessment: Generalized weakness    Cervical / Trunk Assessment Cervical / Trunk Assessment: Kyphotic  Communication   Communication: No difficulties  Cognition Arousal/Alertness: Awake/alert Behavior During Therapy: Agitated (pt upset with family and wants to go home) Overall Cognitive Status: No family/caregiver present to determine baseline cognitive functioning                                        General Comments      Exercises     Assessment/Plan    PT Assessment Patient needs continued PT services  PT Problem List Decreased strength;Decreased activity tolerance;Decreased balance;Decreased mobility;Decreased cognition;Decreased knowledge of use of DME;Decreased safety awareness       PT Treatment Interventions Gait training;DME instruction;Functional mobility training;Therapeutic activities;Therapeutic  exercise;Balance training;Patient/family education    PT Goals (Current goals can be found in the Care Plan section)  Acute Rehab PT Goals Patient Stated Goal: go home PT Goal Formulation: With patient Time For Goal Achievement: 08/06/20 Potential to Achieve Goals: Fair    Frequency Min 3X/week   Barriers to discharge        Co-evaluation               AM-PAC PT "6 Clicks" Mobility  Outcome Measure Help needed turning from your back to your side while in a flat bed without using bedrails?: A Little Help needed moving from lying on your back to sitting on the side of a flat bed without using bedrails?: A Little Help needed moving to and from a bed to a chair (including a wheelchair)?: A Little Help needed standing up from a chair using your arms (e.g., wheelchair or bedside chair)?: A Little Help needed to walk in hospital room?: A Little Help needed climbing 3-5 steps with a railing? : A Lot 6 Click Score: 17    End of Session Equipment Utilized During Treatment: Gait belt Activity Tolerance: Patient tolerated treatment well Patient left: in bed;with call bell/phone within reach Nurse Communication: Mobility status PT Visit Diagnosis: Muscle weakness (generalized) (M62.81);Unsteadiness on feet (R26.81);History of falling (Z91.81);Difficulty in walking, not elsewhere classified (R26.2)    Time: 1517-6160 PT Time Calculation (min) (ACUTE ONLY): 23 min   Charges:   PT  Evaluation $PT Eval Low Complexity: 1 Low PT Treatments $Gait Training: 8-22 mins       Verner Mould, DPT Acute Rehabilitation Services  Office [phone] Pager [phone]  07/23/2020 7:03 PM

## 2020-07-23 NOTE — NC FL2 (Signed)
[place] LEVEL OF CARE SCREENING TOOL     IDENTIFICATION  Patient Name: Alyssa Odonnell Birthdate: 05-28-1934 Sex: female Admission Date (Current Location): 07/19/2020  [facility] and [place] Number:  Herbalist and Address:  [facility],  [place] [address] [place]      Provider Number: [id number]  Attending Physician Name and Address:  Default, Provider, MD  Relative Name and Phone Number:       Current Level of Care: Hospital Recommended Level of Care: Memory Care, [place] Prior Approval Number:    Date Approved/Denied:   PASRR Number: [id number]  Discharge Plan: Other (Comment) (ALF)    Current Diagnoses: Patient Active Problem List   Diagnosis Date Noted  . Adjustment disorder with depressed mood 07/22/2020  . Dementia with behavioral disturbance ([place])   . Open fracture of right zygomatic arch ([place])   . Palliative care by specialist   . DNR (do not resuscitate)   . Malnutrition of moderate degree 10/02/2019  . Pressure injury of skin 10/01/2019  . Malnutrition ([place]) 10/01/2019  . Hypothyroid 10/01/2019  . Failure to thrive in adult 09/30/2019  . Pleural effusion, bilateral 09/30/2019  . Chronic anticoagulation 09/30/2019  . 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 Gastric outlet obstruction 08/29/2019  . PAF (paroxysmal atrial fibrillation) ([place]) 02/02/2018  . 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[place]) 05/24/2017  . Cerebrovascular accident (CVA) due to embolism of left posterior cerebral artery ([place]) 03/09/2017  . htn 03/09/2017  . Generalized weakness 03/09/2017  . Stroke-like symptom 03/09/2017  . Right sided weakness   . PAD (peripheral artery disease) ([place])   . Essential hypertension   . Spinal stenosis of lumbar region   . Pure hypercholesterolemia   . Recurrent major depressive disorder (HCC)     Orientation RESPIRATION BLADDER Height & Weight     Self, Time, Situation, Place  Normal Incontinent Weight:  160 lb (72.6 kg) Height:  5\' 4"  (162.6 cm)  BEHAVIORAL SYMPTOMS/MOOD NEUROLOGICAL BOWEL NUTRITION STATUS      Incontinent Diet  AMBULATORY STATUS COMMUNICATION OF NEEDS Skin   Independent Verbally Normal                       Personal Care Assistance Level of Assistance  Bathing, Feeding, Dressing Bathing Assistance: Independent Feeding assistance: Independent Dressing Assistance: Independent     Functional Limitations Info  Sight, Hearing, Speech Sight Info: Adequate Hearing Info: Adequate Speech Info: Adequate    SPECIAL CARE FACTORS FREQUENCY                       Contractures Contractures Info: Not present    Additional Factors Info  Code Status, Allergies Code Status Info: Full Code Allergies Info: Aspirin, Atorvastatin, Nabumetone, Nsaids, Pravastatin, Procaine, Rosuvastatin, Simvastatin, Statins, Evolocumab, Mobic, and Penicillins.           Current Medications (07/23/2020):  This is the current hospital active medication list Current Facility-Administered Medications  Medication Dose Route Frequency Provider Last Rate Last Admin  . apixaban (ELIQUIS) tablet 5 mg  5 mg Oral BID [name], MD   5 mg at 07/23/20 0917  . clopidogrel (PLAVIX) tablet 75 mg  75 mg Oral Daily [name], MD   75 mg at 07/23/20 0916  . divalproex (DEPAKOTE SPRINKLE) capsule 125 mg  125 mg Oral Daily Suella Broad, FNP   125 mg at 07/23/20 0917  . divalproex (DEPAKOTE SPRINKLE) capsule 250  mg  250 mg Oral QHS Suella Broad, FNP   250 mg at 07/22/20 2113  . escitalopram (LEXAPRO) tablet 20 mg  20 mg Oral Daily [name], MD   20 mg at 07/23/20 0917  . latanoprost (XALATAN) 0.005 % ophthalmic solution 1 drop  1 drop Both Eyes QHS [name], MD   1 drop at 07/23/20 0432  . levothyroxine (SYNTHROID) tablet 100 mcg  100 mcg Oral QAC breakfast [name], MD   100 mcg at 07/23/20 0916  . LORazepam (ATIVAN) tablet 0.5 mg  0.5 mg  Oral BID [name], MD   0.5 mg at 07/23/20 8811  . multivitamin with minerals tablet 1 tablet  1 tablet Oral Daily [name], MD   1 tablet at 07/23/20 0916  . pantoprazole (PROTONIX) EC tablet 40 mg  40 mg Oral Daily [name], MD   40 mg at 07/23/20 0315   Current Outpatient Medications  Medication Sig Dispense Refill  . clopidogrel (PLAVIX) 75 MG tablet Take 75 mg by mouth daily.    . divalproex (DEPAKOTE SPRINKLE) 125 MG capsule Take 1 capsule (125 mg total) by mouth 2 (two) times daily. 60 capsule 3  . ELIQUIS 5 MG TABS tablet TAKE 1 TABLET BY MOUTH TWO  TIMES DAILY (Patient taking differently: Take 5 mg by mouth 2 (two) times daily. ) 60 tablet 0  . escitalopram (LEXAPRO) 20 MG tablet Take 20 mg by mouth daily.    Marland Kitchen levothyroxine (SYNTHROID, LEVOTHROID) 100 MCG tablet Take 100 mcg by mouth daily before breakfast.     . LORazepam (ATIVAN) 0.5 MG tablet Take 0.5 mg by mouth 2 (two) times daily. 12pm and bedtime    . Multiple Vitamin (MULTIVITAMIN WITH MINERALS) TABS tablet Take 1 tablet by mouth daily.    . pantoprazole (PROTONIX) 40 MG tablet Take 40 mg by mouth daily.    . traMADol (ULTRAM) 50 MG tablet Take 50 mg by mouth 2 (two) times daily as needed for moderate pain or severe pain.     Marland Kitchen amiodarone (PACERONE) 200 MG tablet Take 1 tablet (200 mg total) by mouth daily. 30 tablet 0     Discharge Medications: Please see discharge summary for a list of discharge medications.  Relevant Imaging Results:  Relevant Lab Results:   Additional Information SSN#  [id number]  Sharmayne Jablon C Tarpley-Carter, LCSWA

## 2020-07-24 NOTE — Progress Notes (Signed)
TOC CSW spoke with doctor in regards to pts mental assessment.  CSW shared her findings with doctor in regards to when she spoke with pt.  Pt was alert, but not oriented to time or situation.  Both doctor and CSW agreed.  Dr. Regenia Skeeter will also reinstate pts IVC, due to mental assessment findings.    CSW also spoke with Janett Billow about [facility] bed offer.  Janett Billow has not heard from administration if pt has been accepted or not.  [facility] administration needed additional information (listed below) to assist with decision.  Additional information requested from [facility].      -Date of Admission      -Reason for ER visit      -IVC CSW will continue to follow for dc needs.  Neriyah Cercone Tarpley-Carter, MSW, LCSW-A Pronouns:  She, Her, [place] ED Transitions of CareClinical Social Worker [email or web address] [phone]

## 2020-07-24 NOTE — Progress Notes (Signed)
CSW called and spoke with Janett Billow in admissions about [facility] bed offer.  Janett Billow has not heard from administration if pt has been accepted or not.  [facility] administration needed additional information to assist with decision and per the notes this was sent to [place] at [place] earlier in the day.  Per Janett Billow, if a decision is reached on 10/6 today she will update the CSW, but even so D/C would not take place until 07/25/20.  CSW will continue to follow for D/C needs.  [name]. Lorilei Horan  MSW, LCSW, LCAS, CCS Transitions of Care Clinical Social Worker Care Coordination Department Ph: [phone]

## 2020-07-24 NOTE — ED Provider Notes (Addendum)
Emergency Medicine Observation Re-evaluation Note  Alyssa Odonnell is a 86 y.o. female, seen on rounds today.  Pt initially presented to the ED for complaints of IVC and Dementia Currently, the patient is awaiting placement.  Physical Exam  BP (!) 154/93 (BP Location: Left Arm)   Pulse 82   Temp 98.1 F (36.7 C) (Oral)   Resp 18   Ht 5\' 4"  (1.626 m)   Wt 72.6 kg   SpO2 99%   BMI 27.46 kg/m  Physical Exam General: no acute distress Cardiac: RRR, no M/R/G Lungs: clear Psych: sad, otherwise no acute psychosis  ED Course / MDM  EKG:EKG Interpretation  Date/Time:  Friday July 19 2020 18:40:14 EDT Ventricular Rate:  78 PR Interval:    QRS Duration: 81 QT Interval:  412 QTC Calculation: 470 R Axis:   33 Text Interpretation: Sinus rhythm Baseline wander in lead(s) I II aVR 12 Lead; [place] similar to April 2021 Confirmed by Sherwood Gambler [phone]) on 07/20/2020 5:08:51 AM    I have reviewed the labs performed to date as well as medications administered while in observation.  Recent changes in the last 24 hours include Tylenol being given yesterday.  Plan  Current plan is for placement. Patient is not under full IVC at this time.   Sherwood Gambler, MD 07/24/20 [phone]  Of note, patient has a history of dementia. Today she was disoriented and I don't believe she has decision making capacity.    Sherwood Gambler, MD 07/24/20 [phone]

## 2020-07-24 NOTE — Progress Notes (Signed)
Referral to SNF cancelled with [facility] Medicare. [place], [place] ED TOC CM [phone]

## 2020-07-24 NOTE — ED Notes (Signed)
Pt has been cooperative. She gets a little irritable about being here in the afternoons. She is overall cooperative.

## 2020-07-25 MED ORDER — ACETAMINOPHEN 325 MG PO TABS
650.0000 mg | ORAL_TABLET | Freq: Four times a day (QID) | ORAL | Status: DC | PRN
  Administered 2020-07-25 – 2020-07-26 (×2): 650 mg via ORAL
  Filled 2020-07-25 (×2): qty 2

## 2020-07-25 NOTE — Progress Notes (Addendum)
TOC CSW attempted to contact [employer].  CSW left HIPPA compliant message with my contact information.  CSW will continue to follow for dc needs.  Chinmay Squier Tarpley-Carter, MSW, LCSW-A Pronouns:  She, Her, Hers                  [place] ED Transitions of CareClinical Social Worker [email or web address] [phone]

## 2020-07-25 NOTE — Progress Notes (Signed)
TOC CSW attempted to contact Jessica/[facility] [phone].  CSW left HIPPA compliant message with my contact information.  CSW will continue to follow for dc needs.  Bhavana Kady Tarpley-Carter, MSW, LCSW-A Pronouns:  She, Her, [place] ED Transitions of CareClinical Social Worker [email or web address] [phone]

## 2020-07-25 NOTE — Progress Notes (Signed)
TOC CSW attempted to contact [employer].  CSW left HIPPA compliant message with my contact information.  CSW will continue to follow for dc needs.  Geo Slone Tarpley-Carter, MSW, LCSW-A Pronouns:  She, Her, [place] ED Transitions of CareClinical Social Worker [email or web address] [phone]

## 2020-07-25 NOTE — Progress Notes (Signed)
@  10:36 am TOC CSW attempted to contact Jessica/[facility] [phone].  CSW left HIPPA compliant message with my contact information.  @10 :39 am TOC CSW received a call from [facility] stating that due to low acuity they weren't able to accept pt in [place], but has sent approval to [facility] on [place].    @11 :23 am TOC CSW reached out to [facility] [phone].  Randall Hiss stated she had spoke with pts son [name] and he prefers that pt has a private room, due to her situation.  Therefore, Junie Panning reached out to [place] in [place] and [employer] for acceptance.  CSW will continue to follow for dc needs.  Katharine Rochefort Tarpley-Carter, MSW, LCSW-A Pronouns:  She, Her, Hers                  [place] ED Transitions of CareClinical Social Worker [email or web address] [phone]

## 2020-07-25 NOTE — ED Provider Notes (Signed)
Emergency Medicine Observation Re-evaluation Note  Alyssa Odonnell is a 86 y.o. female, seen on rounds today.  Pt initially presented to the ED for complaints of IVC and Dementia Currently, the patient is resting in bed.  Complains of some right knee pain that she is had for quite some time.  On exam she has no effusion or erythema.  No suspicion for septic joint..  Physical Exam  BP (!) 147/77 (BP Location: Left Arm)   Pulse 77   Temp 99 F (37.2 C) (Oral)   Resp 18   Ht 1.626 m (5\' 4" )   Wt 72.6 kg   SpO2 97%   BMI 27.46 kg/m  Physical Exam General: Well-appearing  Cardiac: Regular Lungs: Clear Psych: Calm and cooperative  ED Course / MDM  EKG:EKG Interpretation  Date/Time:  Friday July 19 2020 18:40:14 EDT Ventricular Rate:  78 PR Interval:    QRS Duration: 81 QT Interval:  412 QTC Calculation: 470 R Axis:   33 Text Interpretation: Sinus rhythm Baseline wander in lead(s) I II aVR 12 Lead; Mason-Likar similar to April 2021 Confirmed by Sherwood Gambler [phone]) on 07/20/2020 5:08:51 AM    I have reviewed the labs performed to date as well as medications administered while in observation.  Recent changes in the last 24 hours include as above.  Plan  Current plan is for placement. Patient is under full IVC at this time.   Lacretia Leigh, MD 07/25/20 1032

## 2020-07-25 NOTE — Progress Notes (Signed)
TOC CSW spoke with Erin/[facility] [phone].  Junie Panning gave CSW an update she had spoken with administration about pts bed offer.  Junie Panning stated they could offer a room with a roommate, but pts son/[name] thinks pt needs a private room due to her circumstances. Erin then reached out to [facility], because they have a private room available.  CSW is currently awaiting approval from [facility] administration.  CSW will continue to follow for dc needs.  Anjani Feuerborn Tarpley-Carter, MSW, LCSW-A Pronouns:  She, Her, Hers                  [place] ED Transitions of CareClinical Social Worker [email or web address] [phone]

## 2020-07-25 NOTE — Progress Notes (Signed)
TOC CSW attempted to contact Jessica/[facility] [phone].  CSW left HIPPA compliant message with my contact information.  CSW will continue to follow for dc needs.  Jenan Ellegood Tarpley-Carter, MSW, LCSW-A Pronouns:  She, Her, Hers                  [place] ED Transitions of CareClinical Social Worker [email or web address] [phone]

## 2020-07-25 NOTE — ED Notes (Signed)
Pt continues to be upset, tearful, about not going home. Pt wants to go home.

## 2020-07-26 LAB — RESPIRATORY PANEL BY RT PCR (FLU A&B, COVID)
Influenza A by PCR: NEGATIVE
Influenza B by PCR: NEGATIVE
SARS Coronavirus 2 by RT PCR: NEGATIVE

## 2020-07-26 NOTE — Progress Notes (Addendum)
@  9:46 am TOC CSW spoke with [name]/[facility] in [place].  [name] stated that pt had been denied.  [name] also stated that he had spoken with pts son/[name] about this denial.   @9 :50 am TOC CSW spoke with pts son/[name] about pt be denied at [facility] in [place].  [name] stated he wanted to look into SNF placement.  CSW notified [name] bed offer.  He is willing to accept bed offer.  CSW will continue to follow for dc needs.  [name], MSW, LCSW-A Pronouns:  She, Her, [place] ED Transitions of CareClinical Social Worker [email or web address] [phone]

## 2020-07-26 NOTE — Progress Notes (Signed)
TOC CSW restarted [employer] authorization after the cancellation on 07/24/2020.    Reference #:  [id number]  Facility:  [facility]  CSW will continue to follow for dc needs.  Alyssa Odonnell, MSW, LCSW-A Pronouns:  She, Her, [place] ED Transitions of CareClinical Social Worker [email or web address] [phone]

## 2020-07-26 NOTE — Progress Notes (Signed)
TOC CSW attempted to contact [facility]/Admission [phone].  CSW left HIPPA compliant message with my contact information.  CSW will continue to follow for dc needs.  Christopher Hink Tarpley-Carter, MSW, LCSW-A Pronouns:  She, Her, [place] ED Transitions of CareClinical Social Worker [email or web address] [phone]

## 2020-07-26 NOTE — Progress Notes (Addendum)
TOC CSW spoke with pts son/[name].  [name] has spoken with Angelica Chessman at [facility] in [place], [place] [phone].  Fax [phone].  CSW will continue to follow for dc needs.  Kely Dohn Tarpley-Carter, MSW, LCSW-A Pronouns:  She, Her, [place] ED Transitions of CareClinical Social Worker [email or web address] [phone]

## 2020-07-26 NOTE — Progress Notes (Signed)
TOC CSW received a call from Audrey/[facility] [phone], fax # [phone].  Luellen Pucker had spoken with pts son/[name] about being accepted there.  Luellen Pucker needed information faxed, such as; FL2, nurse and doctors notes.  Luellen Pucker has scheduled a Zoom assessment for tonight at 6pm.   CSW will continue to follow for dc needs.  Keyira Mondesir Tarpley-Carter, MSW, LCSW-A Pronouns:  She, Her, Hers                  [place] ED Transitions of CareClinical Social Worker [email or web address] [phone]

## 2020-07-26 NOTE — Progress Notes (Signed)
@  6 pm  TOC CM/CSW and CM/RN met with [facility] team including Luellen Pucker.  Luellen Pucker and her team spoke with pt about her needs and would she want to come to their facility.  Pt declined their offer in the beginning, but once they explained their services pt stated she would go.  Pt wanted to leave tonight, but CM/CSW explained to pt it would be Monday, 07/29/2020 before she could go.  Luellen Pucker will be emailing CM/CSW the information needed for admission.    [facility] has accepted pt and will be prepared for her on 07/29/2020.  CSW will continue to follow for dc needs.  Delayna Sparlin Tarpley-Carter, MSW, LCSW-A Pronouns:  She, Her, [place] ED Transitions of CareClinical Social Worker [email or web address] [phone]

## 2020-07-26 NOTE — ED Notes (Signed)
Pt complains of bilateral knee pain.  Patient has been calm and cooperative all day.  She becomes tearful when talking about wanting to go home.  She does not want to go to assisted living.

## 2020-07-26 NOTE — Progress Notes (Signed)
ALF assessment was completed. Pt was able to walk with walker and was able to answer questions appropriately. She agreed to go to ALF, [facility]. She is concerned about her kittens at home. Will updated her son. She understands the importance of being in a safe environment as she was living alone with caregivers coming in to assist her with her care. ED provider updated, Requested TB test and updated COVID test. Will send results to ALF on Monday. [facility] has accepted pt and can receive on Monday, as they do not have staff for weekend admission. [place], [place] ED TOC CM [phone]

## 2020-07-26 NOTE — Progress Notes (Signed)
Physical Therapy Treatment Patient Details Name: Alyssa Odonnell MRN: [id number] DOB: [birth year] Today's Date: 07/26/2020    History of Present Illness Alyssa Odonnell is a 86 y.o. female patient admitted with mood instability, worsening confusion, and behavioral disturbances. Patient PMH significant for stroke, PAF, OA, UTI, HTN, HLD, HOH, glaucoma, cardiac loop recoder insertion.     PT Comments    Patient agreeable to participate in PT session. Patient required min guard and extra time needed to complete bed mobility. She was able to ambulate increased distance of ~60' today with RW. Patient remains unsteady and has poor posture with flexed hip/knees in stance phase limiting step length. Patient requires assist to manage walker during turns and to maintain safe proximity. Educated pt on functional LE strengthening. Pt able to complete with verbal cues. She will benefit from skilled PT interventions to progress mobility; continue to recommend SNF level follow up. Acute PT will progress as able.   Follow Up Recommendations  SNF (SNF vs ALF)     Equipment Recommendations  Rolling walker with 5" wheels    Recommendations for Other Services       Precautions / Restrictions Precautions Precautions: Fall Precaution Comments: pt's son reports history of falling Restrictions Weight Bearing Restrictions: No    Mobility  Bed Mobility Overal bed mobility: Needs Assistance Bed Mobility: Supine to Sit;Sit to Supine     Supine to sit: Min guard;HOB elevated Sit to supine: HOB elevated;Min guard   General bed mobility comments: no assist required to bring LE's on/off EOB, pt slow to mobilize.  Transfers Overall transfer level: Needs assistance Equipment used: Rolling walker (2 wheeled) Transfers: Sit to/from Stand Sit to Stand: Min assist         General transfer comment: Min assist to rise from EOB, pt with flexed trunk/posture in standing.  Ambulation/Gait Ambulation/Gait  assistance: Min guard;Min assist Gait Distance (Feet): 50 Feet Assistive device: Rolling walker (2 wheeled) Gait Pattern/deviations: Step-through pattern;Decreased step length - right;Decreased step length - left;Decreased stride length;Shuffle;Decreased dorsiflexion - right;Trunk flexed Gait velocity: decr   General Gait Details: pt requires cues for posture and to improve visual scanning. Assist required to manage position of RW throughout gait. pt with Rt knee flexed in stance phase and c/o Rt knee pain.    Stairs             Wheelchair Mobility    Modified Rankin (Stroke Patients Only)       Balance Overall balance assessment: Needs assistance;History of Falls Sitting-balance support: Feet supported Sitting balance-Leahy Scale: Fair     Standing balance support: During functional activity;Bilateral upper extremity supported Standing balance-Leahy Scale: Poor Standing balance comment: reliant on external support                            Cognition Arousal/Alertness: Awake/alert Behavior During Therapy: WFL for tasks assessed/performed Overall Cognitive Status: Within Functional Limits for tasks assessed                                 General Comments: No family/caregiver present to determine baseline cognitive functioning      Exercises General Exercises - Lower Extremity Long Arc Quad: AROM;Both;10 reps;Seated Hip ABduction/ADduction: AROM;Both;10 reps;Supine Hip Flexion/Marching: AROM;Both;10 reps;Seated Mini-Sqauts: Both;5 reps;AROM;Strengthening;Seated (5 reps sit<>stand from EOB with RW, min assist to steady)    General Comments  Pertinent Vitals/Pain Pain Assessment: Faces Faces Pain Scale: Hurts a little bit Pain Location: Rt knee Pain Descriptors / Indicators: Discomfort Pain Intervention(s): Limited activity within patient's tolerance;Monitored during session;Repositioned    Home Living                       Prior Function            PT Goals (current goals can now be found in the care plan section) Acute Rehab PT Goals Patient Stated Goal: go home PT Goal Formulation: With patient Time For Goal Achievement: 08/06/20 Potential to Achieve Goals: Fair Progress towards PT goals: Progressing toward goals    Frequency    Min 3X/week      PT Plan Current plan remains appropriate    Co-evaluation              AM-PAC PT "6 Clicks" Mobility   Outcome Measure  Help needed turning from your back to your side while in a flat bed without using bedrails?: A Little Help needed moving from lying on your back to sitting on the side of a flat bed without using bedrails?: A Little Help needed moving to and from a bed to a chair (including a wheelchair)?: A Little Help needed standing up from a chair using your arms (e.g., wheelchair or bedside chair)?: A Little Help needed to walk in hospital room?: A Little Help needed climbing 3-5 steps with a railing? : A Lot 6 Click Score: 17    End of Session Equipment Utilized During Treatment: Gait belt Activity Tolerance: Patient tolerated treatment well Patient left: in bed;with call bell/phone within reach Nurse Communication: Mobility status PT Visit Diagnosis: Muscle weakness (generalized) (M62.81);Unsteadiness on feet (R26.81);History of falling (Z91.81);Difficulty in walking, not elsewhere classified (R26.2)     Time: 9728-2060 PT Time Calculation (min) (ACUTE ONLY): 25 min  Charges:  $Gait Training: 8-22 mins $Therapeutic Exercise: 8-22 mins                     Verner Mould, DPT Acute Rehabilitation Services  Office [phone] Pager [phone]  07/26/2020 3:01 PM

## 2020-07-27 NOTE — ED Notes (Signed)
Pt alert this shift. Cooperative .  Pt sad .  Pt tearful. Pt medication compliant.  Pt eating this shift.  Pt able to ambulate with walker and stand by assist.

## 2020-07-28 LAB — URINALYSIS, ROUTINE W REFLEX MICROSCOPIC
Bilirubin Urine: NEGATIVE
Glucose, UA: NEGATIVE mg/dL
Ketones, ur: NEGATIVE mg/dL
Nitrite: NEGATIVE
Protein, ur: NEGATIVE mg/dL
Specific Gravity, Urine: 1.015 (ref 1.005–1.030)
WBC, UA: >50 WBC/hpf — ABNORMAL HIGH (ref 0–5)
pH: 7 (ref 5.0–8.0)

## 2020-07-28 NOTE — Progress Notes (Signed)
FL2 completed and signed by Dr. Roderic Palau, includes negative TB test results.  CSW faxed copy of signed FL2 to [facility] @ [phone]

## 2020-07-29 LAB — URINE CULTURE

## 2020-07-29 LAB — CBC
HCT: 35.9 % — ABNORMAL LOW (ref 36.0–46.0)
Hemoglobin: 11.6 g/dL — ABNORMAL LOW (ref 12.0–15.0)
MCH: 30.4 pg (ref 26.0–34.0)
MCHC: 32.3 g/dL (ref 30.0–36.0)
MCV: 94 fL (ref 80.0–100.0)
Platelets: 197 10*3/uL (ref 150–400)
RBC: 3.82 MIL/uL — ABNORMAL LOW (ref 3.87–5.11)
RDW: 13.2 % (ref 11.5–15.5)
WBC: 6.9 10*3/uL (ref 4.0–10.5)
nRBC: 0 % (ref 0.0–0.2)

## 2020-07-29 LAB — CREATININE, SERUM
Creatinine, Ser: 1.47 mg/dL — ABNORMAL HIGH (ref 0.44–1.00)
GFR, Estimated: 33 mL/min — ABNORMAL LOW (ref >60)

## 2020-07-29 NOTE — ED Provider Notes (Signed)
Emergency Medicine Observation Re-evaluation Note  Alyssa Odonnell is a 86 y.o. female, seen on rounds today.  Pt initially presented to the ED for complaints of IVC and Dementia Currently, the patient is calm and cooperative with no complaints and looking forward to leaving the ER.  Physical Exam  BP 134/77 (BP Location: Left Arm)   Pulse 90   Temp 98.8 F (37.1 C) (Oral)   Resp 18   Ht 5\' 4"  (1.626 m)   Wt 72.6 kg   SpO2 96%   BMI 27.46 kg/m  Physical Exam General: NAD Cardiac: rrr Lungs: no respiratory distress Psych: calm and cooperative and does not appear to repsonding to internal stimuli  ED Course / MDM  EKG:EKG Interpretation  Date/Time:  Friday July 19 2020 18:40:14 EDT Ventricular Rate:  78 PR Interval:    QRS Duration: 81 QT Interval:  412 QTC Calculation: 470 R Axis:   33 Text Interpretation: Sinus rhythm Baseline wander in lead(s) I II aVR 12 Lead; Mason-Likar similar to April 2021 Confirmed by Sherwood Gambler [phone]) on 07/20/2020 5:08:51 AM    I have reviewed the labs performed to date as well as medications administered while in observation.  Recent changes in the last 24 hours include no changes.  Plan  Current plan is for transport to [facility] today. Patient is not under full IVC at this time.   [name], MD 07/29/20 1042

## 2020-07-29 NOTE — ED Notes (Signed)
Pt DC off unit to home to facility . Pt alert, calm, no s/s of distress. DC information given to [facility] for facility . Pt ambulatory with walker. Pt off unit on stretcher, transported by P [facility].

## 2020-07-29 NOTE — Progress Notes (Addendum)
TOC CM/CSW was contacted by Luellen Pucker in regards to paperwork sent, its been received.  [facility] information listed below.  Beth, RN will be given this information as well to call report to facility.  [facility] Address:  [address] [place], [place]  [address]  Room #:  A11  Call Report #:  [phone] [facility]  CSW will continue to follow for dc needs.  Eiley Mcginnity Tarpley-Carter, MSW, LCSW-A Pronouns:  She, Her, Hers                  [place] ED Transitions of CareClinical Social Worker [email or web address] [phone]

## 2020-07-29 NOTE — Progress Notes (Signed)
TOC CM/CSW has given Eustaquio Maize, RN information to call report to [facility] in [place] where pt is going.    [facility] Address:  [address] [place], [place]  [address]  Room #:  A11  Call Report #:  [phone] [facility]  CSW will continue to follow for dc needs.  Dimple Bastyr Tarpley-Carter, MSW, LCSW-A Pronouns:  She, Her, [place] ED Transitions of CareClinical Social Worker [email or web address] [phone]

## 2020-10-15 ENCOUNTER — Emergency Department (HOSPITAL_COMMUNITY): Payer: Medicare Other

## 2020-10-15 ENCOUNTER — Inpatient Hospital Stay (HOSPITAL_COMMUNITY)
Admission: EM | Admit: 2020-10-15 | Discharge: 2020-10-23 | DRG: 065 | Disposition: A | Discharge status: Skilled Nursing Facility | Payer: Medicare Other | Source: Skilled Nursing Facility | Attending: Internal Medicine | Admitting: Internal Medicine

## 2020-10-15 ENCOUNTER — Other Ambulatory Visit: Payer: Self-pay

## 2020-10-15 ENCOUNTER — Encounter (HOSPITAL_COMMUNITY): Payer: Self-pay | Admitting: Emergency Medicine

## 2020-10-15 ENCOUNTER — Inpatient Hospital Stay (HOSPITAL_COMMUNITY): Payer: Medicare Other

## 2020-10-15 DIAGNOSIS — I350 Nonrheumatic aortic (valve) stenosis: Secondary | ICD-10-CM | POA: Diagnosis present

## 2020-10-15 DIAGNOSIS — I340 Nonrheumatic mitral (valve) insufficiency: Secondary | ICD-10-CM | POA: Diagnosis not present

## 2020-10-15 DIAGNOSIS — Z20822 Contact with and (suspected) exposure to covid-19: Secondary | ICD-10-CM | POA: Diagnosis present

## 2020-10-15 DIAGNOSIS — I639 Cerebral infarction, unspecified: Secondary | ICD-10-CM | POA: Diagnosis not present

## 2020-10-15 DIAGNOSIS — F339 Major depressive disorder, recurrent, unspecified: Secondary | ICD-10-CM | POA: Diagnosis present

## 2020-10-15 DIAGNOSIS — I6389 Other cerebral infarction: Secondary | ICD-10-CM | POA: Diagnosis not present

## 2020-10-15 DIAGNOSIS — I480 Paroxysmal atrial fibrillation: Secondary | ICD-10-CM | POA: Diagnosis present

## 2020-10-15 DIAGNOSIS — R29703 NIHSS score 3: Secondary | ICD-10-CM | POA: Diagnosis present

## 2020-10-15 DIAGNOSIS — Z881 Allergy status to other antibiotic agents status: Secondary | ICD-10-CM

## 2020-10-15 DIAGNOSIS — R443 Hallucinations, unspecified: Secondary | ICD-10-CM | POA: Diagnosis present

## 2020-10-15 DIAGNOSIS — Z66 Do not resuscitate: Secondary | ICD-10-CM | POA: Diagnosis present

## 2020-10-15 DIAGNOSIS — I10 Essential (primary) hypertension: Secondary | ICD-10-CM | POA: Diagnosis present

## 2020-10-15 DIAGNOSIS — K219 Gastro-esophageal reflux disease without esophagitis: Secondary | ICD-10-CM | POA: Diagnosis present

## 2020-10-15 DIAGNOSIS — Z9049 Acquired absence of other specified parts of digestive tract: Secondary | ICD-10-CM | POA: Diagnosis not present

## 2020-10-15 DIAGNOSIS — Z823 Family history of stroke: Secondary | ICD-10-CM | POA: Diagnosis not present

## 2020-10-15 DIAGNOSIS — Z86010 Personal history of colonic polyps: Secondary | ICD-10-CM | POA: Diagnosis not present

## 2020-10-15 DIAGNOSIS — I69351 Hemiplegia and hemiparesis following cerebral infarction affecting right dominant side: Secondary | ICD-10-CM | POA: Diagnosis not present

## 2020-10-15 DIAGNOSIS — R4182 Altered mental status, unspecified: Secondary | ICD-10-CM

## 2020-10-15 DIAGNOSIS — I6340 Cerebral infarction due to embolism of unspecified cerebral artery: Principal | ICD-10-CM | POA: Diagnosis present

## 2020-10-15 DIAGNOSIS — I739 Peripheral vascular disease, unspecified: Secondary | ICD-10-CM | POA: Diagnosis present

## 2020-10-15 DIAGNOSIS — Z886 Allergy status to analgesic agent status: Secondary | ICD-10-CM

## 2020-10-15 DIAGNOSIS — Z7901 Long term (current) use of anticoagulants: Secondary | ICD-10-CM | POA: Diagnosis not present

## 2020-10-15 DIAGNOSIS — H905 Unspecified sensorineural hearing loss: Secondary | ICD-10-CM | POA: Diagnosis present

## 2020-10-15 DIAGNOSIS — F05 Delirium due to known physiological condition: Secondary | ICD-10-CM | POA: Diagnosis not present

## 2020-10-15 DIAGNOSIS — N390 Urinary tract infection, site not specified: Secondary | ICD-10-CM | POA: Diagnosis present

## 2020-10-15 DIAGNOSIS — E039 Hypothyroidism, unspecified: Secondary | ICD-10-CM | POA: Diagnosis present

## 2020-10-15 DIAGNOSIS — Z9181 History of falling: Secondary | ICD-10-CM

## 2020-10-15 DIAGNOSIS — Z79890 Hormone replacement therapy (postmenopausal): Secondary | ICD-10-CM

## 2020-10-15 DIAGNOSIS — Z79899 Other long term (current) drug therapy: Secondary | ICD-10-CM

## 2020-10-15 DIAGNOSIS — R791 Abnormal coagulation profile: Secondary | ICD-10-CM | POA: Diagnosis present

## 2020-10-15 DIAGNOSIS — R29810 Facial weakness: Secondary | ICD-10-CM | POA: Diagnosis present

## 2020-10-15 DIAGNOSIS — Z7902 Long term (current) use of antithrombotics/antiplatelets: Secondary | ICD-10-CM

## 2020-10-15 DIAGNOSIS — Z888 Allergy status to other drugs, medicaments and biological substances status: Secondary | ICD-10-CM

## 2020-10-15 DIAGNOSIS — Z8249 Family history of ischemic heart disease and other diseases of the circulatory system: Secondary | ICD-10-CM

## 2020-10-15 DIAGNOSIS — G2581 Restless legs syndrome: Secondary | ICD-10-CM | POA: Diagnosis present

## 2020-10-15 DIAGNOSIS — E785 Hyperlipidemia, unspecified: Secondary | ICD-10-CM | POA: Diagnosis present

## 2020-10-15 DIAGNOSIS — Z79891 Long term (current) use of opiate analgesic: Secondary | ICD-10-CM

## 2020-10-15 DIAGNOSIS — F0391 Unspecified dementia with behavioral disturbance: Secondary | ICD-10-CM | POA: Diagnosis present

## 2020-10-15 DIAGNOSIS — Z880 Allergy status to penicillin: Secondary | ICD-10-CM

## 2020-10-15 DIAGNOSIS — F03918 Unspecified dementia, unspecified severity, with other behavioral disturbance: Secondary | ICD-10-CM | POA: Diagnosis present

## 2020-10-15 LAB — URINALYSIS, ROUTINE W REFLEX MICROSCOPIC
Bilirubin Urine: NEGATIVE
Glucose, UA: NEGATIVE mg/dL
Hgb urine dipstick: NEGATIVE
Ketones, ur: 20 mg/dL — AB
Nitrite: NEGATIVE
Protein, ur: NEGATIVE mg/dL
Specific Gravity, Urine: 1.025 (ref 1.005–1.030)
pH: 5 (ref 5.0–8.0)

## 2020-10-15 LAB — CBC
HCT: 43.9 % (ref 36.0–46.0)
Hemoglobin: 14.6 g/dL (ref 12.0–15.0)
MCH: 30.4 pg (ref 26.0–34.0)
MCHC: 33.3 g/dL (ref 30.0–36.0)
MCV: 91.5 fL (ref 80.0–100.0)
Platelets: 223 10*3/uL (ref 150–400)
RBC: 4.8 MIL/uL (ref 3.87–5.11)
RDW: 12.5 % (ref 11.5–15.5)
WBC: 8.5 10*3/uL (ref 4.0–10.5)
nRBC: 0 % (ref 0.0–0.2)

## 2020-10-15 LAB — DIFFERENTIAL
Abs Immature Granulocytes: 0.02 10*3/uL (ref 0.00–0.07)
Basophils Absolute: 0 10*3/uL (ref 0.0–0.1)
Basophils Relative: 0 %
Eosinophils Absolute: 0.3 10*3/uL (ref 0.0–0.5)
Eosinophils Relative: 3 %
Immature Granulocytes: 0 %
Lymphocytes Relative: 23 %
Lymphs Abs: 2 10*3/uL (ref 0.7–4.0)
Monocytes Absolute: 0.7 10*3/uL (ref 0.1–1.0)
Monocytes Relative: 8 %
Neutro Abs: 5.6 10*3/uL (ref 1.7–7.7)
Neutrophils Relative %: 66 %

## 2020-10-15 LAB — COMPREHENSIVE METABOLIC PANEL
ALT: 30 U/L (ref 0–44)
AST: 25 U/L (ref 15–41)
Albumin: 3.6 g/dL (ref 3.5–5.0)
Alkaline Phosphatase: 80 U/L (ref 38–126)
Anion gap: 12 (ref 5–15)
BUN: 17 mg/dL (ref 8–23)
CO2: 23 mmol/L (ref 22–32)
Calcium: 9.6 mg/dL (ref 8.9–10.3)
Chloride: 105 mmol/L (ref 98–111)
Creatinine, Ser: 1.27 mg/dL — ABNORMAL HIGH (ref 0.44–1.00)
GFR, Estimated: 42 mL/min — ABNORMAL LOW (ref >60)
Glucose, Bld: 89 mg/dL (ref 70–99)
Potassium: 4.2 mmol/L (ref 3.5–5.1)
Sodium: 140 mmol/L (ref 135–145)
Total Bilirubin: 0.6 mg/dL (ref 0.3–1.2)
Total Protein: 7 g/dL (ref 6.5–8.1)

## 2020-10-15 LAB — PROTIME-INR
INR: >10 (ref 0.8–1.2)
Prothrombin Time: >90 seconds — ABNORMAL HIGH (ref 11.4–15.2)

## 2020-10-15 LAB — RESP PANEL BY RT-PCR (FLU A&B, COVID) ARPGX2
Influenza A by PCR: NEGATIVE
Influenza B by PCR: NEGATIVE
SARS Coronavirus 2 by RT PCR: NEGATIVE

## 2020-10-15 LAB — APTT: aPTT: >200 seconds (ref 24–36)

## 2020-10-15 LAB — TSH: TSH: 1.77 u[IU]/mL (ref 0.350–4.500)

## 2020-10-15 MED ORDER — CARVEDILOL 3.125 MG PO TABS
3.1250 mg | ORAL_TABLET | Freq: Two times a day (BID) | ORAL | Status: DC
  Administered 2020-10-16 – 2020-10-23 (×15): 3.125 mg via ORAL
  Filled 2020-10-15 (×15): qty 1

## 2020-10-15 MED ORDER — CARIPRAZINE HCL 1.5 MG PO CAPS
3.0000 mg | ORAL_CAPSULE | Freq: Every day | ORAL | Status: DC
  Administered 2020-10-16 – 2020-10-23 (×8): 3 mg via ORAL
  Filled 2020-10-15 (×2): qty 2
  Filled 2020-10-15: qty 1
  Filled 2020-10-15 (×4): qty 2
  Filled 2020-10-15: qty 1
  Filled 2020-10-15: qty 2
  Filled 2020-10-15: qty 1

## 2020-10-15 MED ORDER — ACETAMINOPHEN 325 MG PO TABS
650.0000 mg | ORAL_TABLET | ORAL | Status: DC | PRN
  Administered 2020-10-17 – 2020-10-22 (×9): 650 mg via ORAL
  Filled 2020-10-15 (×10): qty 2

## 2020-10-15 MED ORDER — SODIUM CHLORIDE 0.9 % IV SOLN
1.0000 g | INTRAVENOUS | Status: AC
  Administered 2020-10-16 – 2020-10-19 (×5): 1 g via INTRAVENOUS
  Filled 2020-10-15 (×5): qty 10

## 2020-10-15 MED ORDER — ESCITALOPRAM OXALATE 10 MG PO TABS
20.0000 mg | ORAL_TABLET | Freq: Every day | ORAL | Status: DC
  Administered 2020-10-16 – 2020-10-23 (×8): 20 mg via ORAL
  Filled 2020-10-15 (×8): qty 2

## 2020-10-15 MED ORDER — DIVALPROEX SODIUM 125 MG PO CSDR
125.0000 mg | DELAYED_RELEASE_CAPSULE | Freq: Every day | ORAL | Status: DC
Start: 2020-10-16 — End: 2020-10-23
  Administered 2020-10-16 – 2020-10-23 (×8): 125 mg via ORAL
  Filled 2020-10-15 (×9): qty 1

## 2020-10-15 MED ORDER — LACTATED RINGERS IV BOLUS
500.0000 mL | Freq: Once | INTRAVENOUS | Status: AC
  Administered 2020-10-16: 500 mL via INTRAVENOUS

## 2020-10-15 MED ORDER — ACETAMINOPHEN 650 MG RE SUPP: 650.0000 mg | RECTAL | Status: DC | PRN

## 2020-10-15 MED ORDER — STROKE: EARLY STAGES OF RECOVERY BOOK: Freq: Once | Status: AC

## 2020-10-15 MED ORDER — LATANOPROST 0.005 % OP SOLN
1.0000 [drp] | Freq: Every day | OPHTHALMIC | Status: DC
  Administered 2020-10-16 – 2020-10-22 (×7): 1 [drp] via OPHTHALMIC
  Filled 2020-10-15: qty 2.5

## 2020-10-15 MED ORDER — DIVALPROEX SODIUM 125 MG PO CSDR
250.0000 mg | DELAYED_RELEASE_CAPSULE | Freq: Every day | ORAL | Status: DC
  Administered 2020-10-16 – 2020-10-22 (×7): 250 mg via ORAL
  Filled 2020-10-15 (×9): qty 2

## 2020-10-15 MED ORDER — DIVALPROEX SODIUM 125 MG PO CSDR: 125.0000 mg | DELAYED_RELEASE_CAPSULE | ORAL | Status: DC

## 2020-10-15 MED ORDER — PANTOPRAZOLE SODIUM 40 MG PO TBEC
40.0000 mg | DELAYED_RELEASE_TABLET | Freq: Every day | ORAL | Status: DC
  Administered 2020-10-16 – 2020-10-23 (×8): 40 mg via ORAL
  Filled 2020-10-15 (×8): qty 1

## 2020-10-15 MED ORDER — ACETAMINOPHEN 160 MG/5ML PO SOLN: 650.0000 mg | ORAL | Status: DC | PRN

## 2020-10-15 MED ORDER — SENNOSIDES-DOCUSATE SODIUM 8.6-50 MG PO TABS: 1.0000 | ORAL_TABLET | Freq: Every evening | ORAL | Status: DC | PRN

## 2020-10-15 MED ORDER — LEVOTHYROXINE SODIUM 100 MCG PO TABS
100.0000 ug | ORAL_TABLET | Freq: Every day | ORAL | Status: DC
  Administered 2020-10-16 – 2020-10-23 (×8): 100 ug via ORAL
  Filled 2020-10-15 (×8): qty 1

## 2020-10-15 MED ORDER — LACTATED RINGERS IV SOLN: INTRAVENOUS | Status: DC

## 2020-10-15 NOTE — ED Notes (Signed)
Patient transported to MRI 

## 2020-10-15 NOTE — H&P (Addendum)
History and Physical    Alyssa Odonnell [id number] DOB: 01/05/1934 DOA: 10/15/2020  PCP: Jenel Lucks, PA-C  Patient coming from: SNF  I have personally briefly reviewed patient's old medical records in [place]  Chief Complaint: AMS  HPI: Alyssa Odonnell is a 86 y.o. female with medical history significant of prior stroke, baseline R sided weakness, HTN, PAF on eliquis, mild dementia, HLD.  Pt presents to ED for new L sided facial droop, hallucinations, AMS.  Per son: pt with several falls over last few days.  Fell on [place], seen at OSH, evaluated and discharged back to SNF.  Pt reports another fall yesterday.  Son reports pt woke up this AM and is not acting like her normal self.  Per son she gets confused but is never persistent like this.  This AM talking to dead parents in the room who wernt there.  This is new for her.  Pt reports that she does feel slightly confused and feels tired.  She does report a dry cough but denies any chest pain or shortness of breath.  She also says she is had some nausea and some diarrhea but denies vomiting.  She denies any urinary changes.  She denies any headache or neck pain but does report some low back pain that has resolved after her fall.  She is unsure about the hallucinations and she does not think she has had a facial droop in the past.  Pt denies pain anywhere, hasnt noticed any blood in stool, denies N/V.   ED Course: Work up in ED demonstrates UTI findings on UA and new acute ischemic strokes on MRI.  Also her INR > 10!  HGB normal, no evidence of bleed anywhere at this point.   Review of Systems: As per HPI, otherwise all review of systems negative.  Past Medical History:  Diagnosis Date  . Acid reflux   . Acquired hammer toe   . Adenomatous colon polyp   . Alopecia   . Arthralgia of pelvic region and thigh   . Bunion   . Callus   . Cataract   . Gastric outlet obstruction 08/2019  . Glaucoma   . H/O  acute bronchitis   . Hearing loss, sensorineural   . Hx of anaphylactic shock   . Hyperlipidemia   . Hypertension   . Hypothyroid   . Impingement syndrome of shoulder   . Incisional hernia, incarcerated   . Migraine headache   . Non-neoplastic nevus   . Osteoarthritis, knee   . PAF (paroxysmal atrial fibrillation) ([place])   . Plantar fat pad atrophy   . RLS (restless legs syndrome)   . Stroke ([place])   . Tinea corporis   . UTI (urinary tract infection)   . Vitamin D deficiency     Past Surgical History:  Procedure Laterality Date  . ABDOMINAL AORTOGRAM W/LOWER EXTREMITY Right 09/11/2019   Procedure: ABDOMINAL AORTOGRAM W/LOWER EXTREMITY;  Surgeon: Waynetta Sandy, MD;  Location: [place] [facility];  Service: Cardiovascular;  Laterality: Right;  . ABDOMINAL HERNIA REPAIR    . CHOLECYSTECTOMY    . COLONOSCOPY    . GASTROSTOMY N/A 09/01/2019   Procedure: INSERTION OF GASTROSTOMY TUBE;  Surgeon: Erroll Luna, MD;  Location: [facility];  Service: General;  Laterality: N/A;  . hernia repair w/ mesh    . HIATAL HERNIA REPAIR    . LAPAROSCOPIC LYSIS OF ADHESIONS  09/01/2019   Procedure: Laparoscopic Lysis Of Adhesions;  Surgeon: Brantley Stage,  Marcello Moores, MD;  Location: [place];  Service: General;;  . LAPAROSCOPIC NISSEN FUNDOPLICATION N/A [id number]   Procedure: LAPAROSCOPIC, POSSIBLE OPEN, GASTROPEXY;  Surgeon: Erroll Luna, MD;  Location: [place];  Service: General;  Laterality: N/A;  . LOOP RECORDER INSERTION N/A 03/11/2017   Procedure: Loop Recorder Insertion;  Surgeon: Evans Lance, MD;  Location: [place] [facility];  Service: Cardiovascular;  Laterality: N/A;  . NEUROPLASTY / TRANSPOSITION MEDIAN NERVE AT CARPAL TUNNEL    . PERIPHERAL VASCULAR BALLOON ANGIOPLASTY Left 09/11/2019   Procedure: PERIPHERAL VASCULAR BALLOON ANGIOPLASTY;  Surgeon: Waynetta Sandy, MD;  Location: [facility] [facility];  Service: Cardiovascular;  Laterality: Left;  TP trunk  . PERIPHERAL VASCULAR  CATHETERIZATION Right 07/06/2016   Procedure: Lower Extremity Angiography;  Surgeon: Angelia Mould, MD;  Location: [place] [facility];  Service: Cardiovascular;  Laterality: Right;  . PERIPHERAL VASCULAR CATHETERIZATION Right 07/06/2016   Procedure: Peripheral Vascular Intervention;  Surgeon: Angelia Mould, MD;  Location: [facility] [facility];  Service: Cardiovascular;  Laterality: Right;  Superficial femoral  . PERIPHERAL VASCULAR INTERVENTION Left 09/11/2019   Procedure: PERIPHERAL VASCULAR INTERVENTION;  Surgeon: Waynetta Sandy, MD;  Location: [place] [facility];  Service: Cardiovascular;  Laterality: Left;  SFA  . skin lesion excision cheek/face    . TEE WITHOUT CARDIOVERSION N/A 03/11/2017   Procedure: TRANSESOPHAGEAL ECHOCARDIOGRAM (TEE);  Surgeon: Acie Fredrickson Wonda Cheng, MD;  Location: [facility];  Service: Cardiovascular;  Laterality: N/A;  . TOTAL HIP ARTHROPLASTY    . TUBAL LIGATION       reports that she has never smoked. She has never used smokeless tobacco. She reports that she does not drink alcohol and does not use drugs.  Allergies  Allergen Reactions  . Aspirin Other (See Comments)    Red splotches  . Atorvastatin Other (See Comments)    Muscle spams  . Nabumetone Itching  . Nsaids Other (See Comments)    Unknown reaction  . Pravastatin Other (See Comments)    Stiff,  walking difficulty   . Procaine Swelling  . Rosuvastatin Nausea Only and Other (See Comments)    Reflux and Muscle stiffness  . Simvastatin Nausea Only and Other (See Comments)    Reflux and Muscle stiffness  . Statins Other (See Comments)    Difficulty walking, muscle spasms, reflux, muscle stiffness  . Evolocumab Rash  . Mobic [Meloxicam] Rash  . Penicillins Rash    Has patient had a PCN reaction causing immediate rash, facial/tongue/throat swelling, SOB or lightheadedness with hypotension: Yes Has patient had a PCN reaction causing severe rash involving mucus membranes or skin  necrosis: No Has patient had a PCN reaction that required hospitalization: No Has patient had a PCN reaction occurring within the last 10 years: No If all of the above answers are "NO", then may proceed with Cephalosporin use.      Family History  Problem Relation Age of Onset  . Osteoporosis Mother   . Hypertension Mother   . Heart failure Mother   . Stroke Father   . Heart disease Father   . Heart attack Father   . Cataracts Brother   . Colon cancer Paternal Aunt   . Glaucoma Paternal Aunt   . Dementia Neg Hx   . Alzheimer's disease Neg Hx      Prior to Admission medications   Medication Sig Start Date End Date Taking? Authorizing Provider  acetaminophen (TYLENOL) 325 MG tablet Take 650 mg by mouth every 6 (six)  hours as needed (pain).   Yes [provider]  amLODipine (NORVASC) 10 MG tablet Take 10 mg by mouth daily. 10/13/20  Yes [provider]  carvedilol (COREG) 3.125 MG tablet Take 3.125 mg by mouth 2 (two) times daily. 10/14/20  Yes [provider]  clopidogrel (PLAVIX) 75 MG tablet Take 75 mg by mouth daily.   Yes [provider]  divalproex (DEPAKOTE SPRINKLE) 125 MG capsule Take 125-250 mg by mouth See admin instructions. 1 capsule in the morning and 2 capsules at bedtime.   Yes [provider]  ELIQUIS 5 MG TABS tablet TAKE 1 TABLET BY MOUTH TWO  TIMES DAILY Patient taking differently: Take 5 mg by mouth 2 (two) times daily. 03/14/19  Yes Evans Lance, MD  escitalopram (LEXAPRO) 20 MG tablet Take 20 mg by mouth daily.   Yes [provider]  latanoprost (XALATAN) 0.005 % ophthalmic solution Place 1 drop into both eyes at bedtime. 09/09/20  Yes [provider]  levothyroxine (SYNTHROID, LEVOTHROID) 100 MCG tablet Take 100 mcg by mouth daily before breakfast.  05/11/16  Yes [provider]  LORazepam (ATIVAN) 0.5 MG tablet Take 0.5 mg by mouth 2 (two) times daily. 12pm and bedtime   Yes [provider]  Multiple Vitamins-Minerals (CERTA-VITE PO) Take 1 tablet by mouth daily.   Yes [provider]  pantoprazole (PROTONIX) 40 MG tablet Take 40 mg by mouth daily.   Yes [provider]  VRAYLAR capsule Take 3 mg by mouth daily. 10/09/20  Yes [provider]  amiodarone (PACERONE) 200 MG tablet Take 1 tablet (200 mg total) by mouth daily. Patient not taking: Reported on 10/15/2020 09/26/19 05/16/20  Donne Hazel, MD  divalproex (DEPAKOTE SPRINKLE) 125 MG capsule Take 1 capsule (125 mg total) by mouth 2 (two) times daily. Patient not taking: Reported on 10/15/2020 05/16/20   Melvenia Beam, MD  lisinopril (ZESTRIL) 5 MG tablet Take 5 mg by mouth daily. Patient not taking: Reported on 10/15/2020 09/09/20   [provider]  nitrofurantoin, macrocrystal-monohydrate, (MACROBID) 100 MG capsule Take 100 mg by mouth 2 (two) times daily. Patient not taking: Reported on 10/15/2020 09/11/20   [provider]    Physical Exam: Vitals:   10/15/20 1735 10/15/20 1800 10/15/20 1815 10/15/20 1920  BP: 118/79 (!) 116/96 131/79 130/69  Pulse: 89 87 97 93  Resp: (!) 22 20 17 19   Temp:      TempSrc:      SpO2: 96% 99% 100% 100%    Constitutional: NAD, calm, comfortable Eyes: PERRL, lids and conjunctivae normal ENMT: Mucous membranes are moist. Posterior pharynx clear of any exudate or lesions.Normal dentition.  Neck: normal, supple, no masses, no thyromegaly Respiratory: clear to auscultation bilaterally, no wheezing, no crackles. Normal respiratory effort. No accessory muscle use.  Cardiovascular: Regular rate and rhythm, Murmur present.  No extremity edema. 2+ pedal pulses. No carotid bruits.  Abdomen: no tenderness, no masses palpated. No hepatosplenomegaly. Bowel sounds positive.  Musculoskeletal: no clubbing / cyanosis. No joint deformity upper and lower extremities. Good ROM, no contractures. Normal muscle tone.  Skin: no rashes, lesions,  ulcers. No induration Neurologic: antigravity in BUE, barely antigravity in BLE, not really seeing much facial asymmetry. Psychiatric: Oriented to self, poor concentration.  Labs on Admission: I have personally reviewed following labs and imaging studies  CBC: Recent Labs  Lab 10/15/20 1505  WBC 8.5  NEUTROABS 5.6  HGB 14.6  HCT 43.9  MCV 91.5  PLT [id number]   Basic Metabolic Panel: Recent Labs  Lab 10/15/20 1505  NA 140  K 4.2  CL 105  CO2 23  GLUCOSE 89  BUN 17  CREATININE 1.27*  CALCIUM 9.6   GFR: CrCl cannot be calculated (Unknown ideal weight.). Liver Function Tests: Recent Labs  Lab 10/15/20 1505  AST 25  ALT 30  ALKPHOS 80  BILITOT 0.6  PROT 7.0  ALBUMIN 3.6   No results for input(s): LIPASE, AMYLASE in the last 168 hours. No results for input(s): AMMONIA in the last 168 hours. Coagulation Profile: Recent Labs  Lab 10/15/20 1505  INR >10.0*   Cardiac Enzymes: No results for input(s): CKTOTAL, CKMB, CKMBINDEX, TROPONINI in the last 168 hours. BNP (last 3 results) No results for input(s): PROBNP in the last 8760 hours. HbA1C: No results for input(s): HGBA1C in the last 72 hours. CBG: No results for input(s): GLUCAP in the last 168 hours. Lipid Profile: No results for input(s): CHOL, HDL, LDLCALC, TRIG, CHOLHDL, LDLDIRECT in the last 72 hours. Thyroid Function Tests: Recent Labs    10/15/20 1746  TSH 1.770   Anemia Panel: No results for input(s): VITAMINB12, FOLATE, FERRITIN, TIBC, IRON, RETICCTPCT in the last 72 hours. Urine analysis:    Component Value Date/Time   COLORURINE AMBER (A) 10/15/2020 1746   APPEARANCEUR HAZY (A) 10/15/2020 1746   LABSPEC 1.025 10/15/2020 1746   PHURINE 5.0 10/15/2020 1746   GLUCOSEU NEGATIVE 10/15/2020 1746   HGBUR NEGATIVE 10/15/2020 1746   BILIRUBINUR NEGATIVE 10/15/2020 1746   KETONESUR 20 (A) 10/15/2020 1746   PROTEINUR NEGATIVE 10/15/2020 1746   NITRITE NEGATIVE 10/15/2020 1746   LEUKOCYTESUR MODERATE  (A) 10/15/2020 1746    Radiological Exams on Admission: CT HEAD WO CONTRAST  Result Date: 10/15/2020 CLINICAL DATA:  Head trauma, abnormal mental status (Age 71-64y) Progressive altered mental status. EXAM: CT HEAD WITHOUT CONTRAST TECHNIQUE: Contiguous axial images were obtained from the base of the skull through the vertex without intravenous contrast. COMPARISON:  Head CT yesterday, as well as 10/12/2020 FINDINGS: Brain: Despite repeat acquisition, there is motion artifact. No acute hemorrhage allowing for motion limitations. Stable degree of atrophy and chronic small vessel ischemia. Remote left posterior parietooccipital infarct. Stable remote lacunar infarcts in the basal ganglia versus prominent perivascular space. No evidence of acute or developing ischemia. No subdural or extra-axial collection. No hydrocephalus. Vascular: No obvious hyperdense vessel, motion artifact limitations. Skull: No fracture or focal lesion. Sinuses/Orbits: Moderately motion obscured. No acute findings. Paranasal sinuses and mastoid air cells are clear. Other: None. IMPRESSION: 1. Motion limited exam. No acute intracranial abnormality. No skull fracture. 2. Stable atrophy, chronic small vessel ischemia, and remote infarcts. Electronically Signed   By: Keith Rake M.D.   On: 10/15/2020 15:20   MR BRAIN WO CONTRAST  Result Date: 10/15/2020 CLINICAL DATA:  Left facial droop EXAM: MRI HEAD WITHOUT CONTRAST TECHNIQUE: Multiplanar, multiecho pulse sequences of the brain and surrounding structures were obtained without intravenous contrast. COMPARISON:  None. FINDINGS: Abbreviated protocol was used due to patient altered mental status, consisting of axial and coronal diffusion-weighted imaging. There is a small area of acute ischemia within the right corona radiata. Second focus of acute ischemia within the anteromedial right thalamus. There is generalized atrophy. Old left occipital lobe infarct. IMPRESSION: 1. Truncated  examination due to patient altered mental status. 2. Small areas of acute ischemia within the right corona radiata and anteromedial right thalamus. Electronically Signed   By: Cletus Gash.D.  On: 10/15/2020 18:58   DG Chest Portable 1 View  Result Date: 10/15/2020 CLINICAL DATA:  Cough, dementia, altered level of consciousness EXAM: PORTABLE CHEST 1 VIEW COMPARISON:  10/14/2020 FINDINGS: Single frontal view of the chest demonstrates mild enlargement the cardiac silhouette. Large hiatal hernia again noted. No acute airspace disease, effusion, or pneumothorax. Cardiac monitor within the left anterior chest wall. IMPRESSION: 1. No acute intrathoracic process. 2. Large hiatal hernia. Electronically Signed   By: Sharlet Salina M.D.   On: 10/15/2020 17:30    EKG: Independently reviewed.  Assessment/Plan Principal Problem:   Acute ischemic stroke [facility]) Active Problems:   Essential hypertension   PAF (paroxysmal atrial fibrillation) (H[facility])   Dementia with behavioral disturbance ([facility])   Elevated INR    1. Acute ischemic stroke - 1. See neurology note 2. Stroke pathway 3. Tele monitor 4. SDU monitoring due to elevated INR 1. Also not ordering any antiplatelets at the moment - holding plavix 2. Holding eliquis 3. No need to reverse at this time 4. Repeat INR in AM 5. Repeat CBC/BMP in AM 5. 2d echo 6. Carotid dopplers 7. FLP 8. A1C 9. PT/OT/SLP 10. Needs swallow screen before taking POs 2. HTN - 1. Due to duration of symptoms, neurology doesn't recommend permissive HTN 2. BP still on the soft side tonight: 1. IVF: 500cc bolus and 125 cc/hr 2. Holding amiodarone for the moment 3. UTI - 1. Rocephin 2. UCx pending 4. PAF - 1. Cont coreg 2. Holding eliquis due to acute stroke and INR >10 5. Dementia - 1. Cont chronic dementia meds  DVT prophylaxis: SCDs Code Status: Full Code per face sheet from SNF Family Communication: No family in room Disposition Plan: SNF after stroke  work up and UTI treatment Consults called: Dr. Wilford Corner neurology Admission status: Admit to inpatient  Severity of Illness: The appropriate patient status for this patient is INPATIENT. Inpatient status is judged to be reasonable and necessary in order to provide the required intensity of service to ensure the patient's safety. The patient's presenting symptoms, physical exam findings, and initial radiographic and laboratory data in the context of their chronic comorbidities is felt to place them at high risk for further clinical deterioration. Furthermore, it is not anticipated that the patient will be medically stable for discharge from the hospital within 2 midnights of admission. The following factors support the patient status of inpatient.   IP status due to acute ischemic stroke, AMS.   * I certify that at the point of admission it is my clinical judgment that the patient will require inpatient hospital care spanning beyond 2 midnights from the point of admission due to high intensity of service, high risk for further deterioration and high frequency of surveillance required.*    Nelly Scriven M. DO [facility]  How to contact the [facility] Attending or Consulting provider 7A - 7P or covering provider during after hours 7P -7A, for this patient?  1. Check the care team in [facility] and look for a) attending/consulting [facility] provider listed and b) the [facility] team listed 2. Log into [email or web address]  Amion Physician Scheduling and messaging for groups and whole hospitals  On call and physician scheduling software for group practices, residents, hospitalists and other medical providers for call, clinic, rotation and shift schedules. On[employer] is a hospital-wide system for scheduling doctors and paging doctors on call. EasyPlot is for scientific plotting and data analysis.  [email or web address]  and use [place]'s universal password to access. If you do  not have the password, please contact the  hospital operator.  3. Locate the [facility] provider you are looking for under [facility] and page to a number that you can be directly reached. 4. If you still have difficulty reaching the provider, please page the [facility] (Director on Call) for the Hospitalists listed on amion for assistance.  10/15/2020, 9:18 PM

## 2020-10-15 NOTE — ED Provider Notes (Signed)
[facility] EMERGENCY DEPARTMENT Provider Note   CSN: [id number] Arrival date & time: 10/15/20  1405     History Chief Complaint  Patient presents with  . Altered Mental Status    Alyssa Odonnell is a 86 y.o. female.  The history is provided by the patient.  Neurologic Problem This is a new problem. The current episode started 12 to 24 hours ago. The problem occurs constantly. The problem has not changed since onset.Pertinent negatives include no chest pain, no abdominal pain, no headaches and no shortness of breath. Nothing aggravates the symptoms. Nothing relieves the symptoms. She has tried nothing for the symptoms. The treatment provided no relief.       Past Medical History:  Diagnosis Date  . Acid reflux   . Acquired hammer toe   . Adenomatous colon polyp   . Alopecia   . Arthralgia of pelvic region and thigh   . Bunion   . Callus   . Cataract   . Gastric outlet obstruction 08/2019  . Glaucoma   . H/O acute bronchitis   . Hearing loss, sensorineural   . Hx of anaphylactic shock   . Hyperlipidemia   . Hypertension   . Hypothyroid   . Impingement syndrome of shoulder   . Incisional hernia, incarcerated   . Migraine headache   . Non-neoplastic nevus   . Osteoarthritis, knee   . PAF (paroxysmal atrial fibrillation) ([place])   . Plantar fat pad atrophy   . RLS (restless legs syndrome)   . Stroke ([place])   . Tinea corporis   . UTI (urinary tract infection)   . Vitamin D deficiency     Patient Active Problem List   Diagnosis Date Noted  . Adjustment disorder with depressed mood 07/22/2020  . Dementia with behavioral disturbance ([place])   . Open fracture of right zygomatic arch ([place])   . Palliative care by specialist   . DNR (do not resuscitate)   . Malnutrition of moderate degree 10/02/2019  . Pressure injury of skin 10/01/2019  . Malnutrition ([place]) 10/01/2019  . Hypothyroid 10/01/2019  . Failure to thrive in adult 09/30/2019  . Pleural  effusion, bilateral 09/30/2019  . Chronic anticoagulation 09/30/2019  . 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 Gastric outlet obstruction 08/29/2019  . PAF (paroxysmal atrial fibrillation) ([place]) 02/02/2018  . 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[place]) 05/24/2017  . Cerebrovascular accident (CVA) due to embolism of left posterior cerebral artery ([place]) 03/09/2017  . htn 03/09/2017  . Generalized weakness 03/09/2017  . Stroke-like symptom 03/09/2017  . Right sided weakness   . PAD (peripheral artery disease) ([place])   . Essential hypertension   . Spinal stenosis of lumbar region   . Pure hypercholesterolemia   . Recurrent major depressive disorder [facility])     Past Surgical History:  Procedure Laterality Date  . ABDOMINAL AORTOGRAM W/LOWER EXTREMITY Right 09/11/2019   Procedure: ABDOMINAL AORTOGRAM W/LOWER EXTREMITY;  Surgeon: Waynetta Sandy, MD;  Location: [facility];  Service: Cardiovascular;  Laterality: Right;  . ABDOMINAL HERNIA REPAIR    . CHOLECYSTECTOMY    . COLONOSCOPY    . GASTROSTOMY N/A 09/01/2019   Procedure: INSERTION OF GASTROSTOMY TUBE;  Surgeon: Erroll Luna, MD;  Location: [facility];  Service: General;  Laterality: N/A;  . hernia repair w/ mesh    . HIATAL HERNIA REPAIR    . LAPAROSCOPIC LYSIS OF ADHESIONS  09/01/2019   Procedure: Laparoscopic Lysis Of Adhesions;  Surgeon: Erroll Luna, MD;  Location:  [facility];  Service: General;;  . LAPAROSCOPIC NISSEN FUNDOPLICATION N/A [id number]   Procedure: LAPAROSCOPIC, POSSIBLE OPEN, GASTROPEXY;  Surgeon: Erroll Luna, MD;  Location: [facility];  Service: General;  Laterality: N/A;  . LOOP RECORDER INSERTION N/A 03/11/2017   Procedure: Loop Recorder Insertion;  Surgeon: Evans Lance, MD;  Location: [facility];  Service: Cardiovascular;  Laterality: N/A;  . NEUROPLASTY / TRANSPOSITION MEDIAN NERVE AT CARPAL TUNNEL    . PERIPHERAL VASCULAR BALLOON ANGIOPLASTY Left 09/11/2019   Procedure: PERIPHERAL VASCULAR BALLOON  ANGIOPLASTY;  Surgeon: Waynetta Sandy, MD;  Location: [facility];  Service: Cardiovascular;  Laterality: Left;  TP trunk  . PERIPHERAL VASCULAR CATHETERIZATION Right 07/06/2016   Procedure: Lower Extremity Angiography;  Surgeon: Angelia Mould, MD;  Location: [facility];  Service: Cardiovascular;  Laterality: Right;  . PERIPHERAL VASCULAR CATHETERIZATION Right 07/06/2016   Procedure: Peripheral Vascular Intervention;  Surgeon: Angelia Mould, MD;  Location: [facility];  Service: Cardiovascular;  Laterality: Right;  Superficial femoral  . PERIPHERAL VASCULAR INTERVENTION Left 09/11/2019   Procedure: PERIPHERAL VASCULAR INTERVENTION;  Surgeon: Waynetta Sandy, MD;  Location: [facility];  Service: Cardiovascular;  Laterality: Left;  SFA  . skin lesion excision cheek/face    . TEE WITHOUT CARDIOVERSION N/A 03/11/2017   Procedure: TRANSESOPHAGEAL ECHOCARDIOGRAM (TEE);  Surgeon: Acie Fredrickson Wonda Cheng, MD;  Location: [facility];  Service: Cardiovascular;  Laterality: N/A;  . TOTAL HIP ARTHROPLASTY    . TUBAL LIGATION       OB History   No obstetric history on file.     Family History  Problem Relation Age of Onset  . Osteoporosis Mother   . Hypertension Mother   . Heart failure Mother   . Stroke Father   . Heart disease Father   . Heart attack Father   . Cataracts Brother   . Colon cancer Paternal Aunt   . Glaucoma Paternal Aunt   . Dementia Neg Hx   . Alzheimer's disease Neg Hx     Social History   Tobacco Use  . Smoking status: Never Smoker  . Smokeless tobacco: Never Used  Vaping Use  . Vaping Use: Never used  Substance Use Topics  . Alcohol use: Never  . Drug use: Never    Home Medications Prior to Admission medications   Medication Sig Start Date End Date Taking? Authorizing Provider  amiodarone (PACERONE) 200 MG tablet Take 1 tablet (200 mg total) by mouth daily. 09/26/19 05/16/20  Donne Hazel, MD   clopidogrel (PLAVIX) 75 MG tablet Take 75 mg by mouth daily.    [provider]  divalproex (DEPAKOTE SPRINKLE) 125 MG capsule Take 1 capsule (125 mg total) by mouth 2 (two) times daily. 05/16/20   Melvenia Beam, MD  ELIQUIS 5 MG TABS tablet TAKE 1 TABLET BY MOUTH TWO  TIMES DAILY Patient taking differently: Take 5 mg by mouth 2 (two) times daily.  03/14/19   Evans Lance, MD  escitalopram (LEXAPRO) 20 MG tablet Take 20 mg by mouth daily.    [provider]  levothyroxine (SYNTHROID, LEVOTHROID) 100 MCG tablet Take 100 mcg by mouth daily before breakfast.  05/11/16   [provider]  LORazepam (ATIVAN) 0.5 MG tablet Take 0.5 mg by mouth 2 (two) times daily. 12pm and bedtime    [provider]  Multiple Vitamin (MULTIVITAMIN WITH MINERALS) TABS tablet Take 1 tablet by mouth daily. 10/06/19   Landis Gandy  G, MD  pantoprazole (PROTONIX) 40 MG tablet Take 40 mg by mouth daily.    [provider]  traMADol (ULTRAM) 50 MG tablet Take 50 mg by mouth 2 (two) times daily as needed for moderate pain or severe pain.  05/30/20   [provider]    Allergies    Aspirin, Atorvastatin, Nabumetone, Nsaids, Pravastatin, Procaine, Rosuvastatin, Simvastatin, Statins, Evolocumab, Mobic [meloxicam], and Penicillins  Review of Systems   Review of Systems  Constitutional: Positive for fatigue. Negative for chills, diaphoresis and fever.  Eyes: Negative for visual disturbance.  Respiratory: Positive for cough. Negative for chest tightness, shortness of breath and wheezing.   Cardiovascular: Negative for chest pain, palpitations and leg swelling.  Gastrointestinal: Positive for diarrhea and nausea. Negative for abdominal pain, constipation and vomiting.  Genitourinary: Negative for dysuria and frequency.  Musculoskeletal: Positive for back pain (chronic reported). Negative for neck pain and neck stiffness.  Neurological: Positive for facial asymmetry and  weakness (at baseline). Negative for dizziness, speech difficulty, light-headedness, numbness and headaches.  Psychiatric/Behavioral: Positive for confusion and hallucinations. Negative for agitation.    Physical Exam Updated Vital Signs BP 111/83   Pulse 87   Temp 97.7 F (36.5 C) (Oral)   Resp 16   SpO2 98%   Physical Exam Vitals and nursing note reviewed.  Constitutional:      General: She is not in acute distress.    Appearance: She is well-developed and well-nourished. She is not ill-appearing, toxic-appearing or diaphoretic.  HENT:     Head: Normocephalic and atraumatic.     Right Ear: External ear normal.     Left Ear: External ear normal.     Nose: Nose normal. No congestion or rhinorrhea.     Mouth/Throat:     Mouth: Oropharynx is clear and moist. Mucous membranes are dry.     Pharynx: No oropharyngeal exudate or posterior oropharyngeal erythema.  Eyes:     Extraocular Movements: Extraocular movements intact and EOM normal.     Conjunctiva/sclera: Conjunctivae normal.     Pupils: Pupils are equal, round, and reactive to light.  Cardiovascular:     Rate and Rhythm: Normal rate.     Pulses: Normal pulses.     Heart sounds: Murmur heard.    Pulmonary:     Effort: Pulmonary effort is normal. No respiratory distress.     Breath sounds: No stridor. Rhonchi present. No wheezing or rales.  Chest:     Chest wall: No tenderness.  Abdominal:     General: Abdomen is flat. There is no distension.     Tenderness: There is no abdominal tenderness. There is no right CVA tenderness, left CVA tenderness, guarding or rebound.  Musculoskeletal:        General: No tenderness.     Cervical back: Normal range of motion and neck supple. No tenderness.     Right lower leg: No edema.     Left lower leg: No edema.  Skin:    General: Skin is warm.     Capillary Refill: Capillary refill takes less than 2 seconds.     Coloration: Skin is not pale.     Findings: No erythema or rash.   Neurological:     Mental Status: She is alert. She is confused.     GCS: GCS eye subscore is 4. GCS verbal subscore is 5. GCS motor subscore is 6.     Cranial Nerves: Facial asymmetry present. No dysarthria.     Sensory: No  sensory deficit.     Motor: Weakness present. No abnormal muscle tone.     Coordination: Finger-Nose-Finger Test abnormal.     Deep Tendon Reflexes: Reflexes are normal and symmetric.     Comments: Subtle weakness in right arm and right leg impaired left.  Left facial droop sparing her forehead.  Normal extraocular movements.  Difficulty with finger-nose-finger testing on the right side.  Psychiatric:        Mood and Affect: Mood normal.     ED Results / Procedures / Treatments   Labs (all labs ordered are listed, but only abnormal results are displayed) Labs Reviewed  PROTIME-INR - Abnormal; Notable for the following components:      Result Value   Prothrombin Time >90.0 (*)    INR >10.0 (*)    All other components within normal limits  APTT - Abnormal; Notable for the following components:   aPTT >200 (*)    All other components within normal limits  COMPREHENSIVE METABOLIC PANEL - Abnormal; Notable for the following components:   Creatinine, Ser 1.27 (*)    GFR, Estimated 42 (*)    All other components within normal limits  URINALYSIS, ROUTINE W REFLEX MICROSCOPIC - Abnormal; Notable for the following components:   Color, Urine AMBER (*)    APPearance HAZY (*)    Ketones, ur 20 (*)    Leukocytes,Ua MODERATE (*)    Bacteria, UA FEW (*)    All other components within normal limits  RESP PANEL BY RT-PCR (FLU A&B, COVID) ARPGX2  URINE CULTURE  CBC  DIFFERENTIAL  TSH  HEMOGLOBIN A1C  LIPID PANEL  CBC  BASIC METABOLIC PANEL  PROTIME-INR    EKG EKG Interpretation  Date/Time:  Tuesday October 15 2020 14:27:59 EST Ventricular Rate:  91 PR Interval:  128 QRS Duration: 70 QT Interval:  364 QTC Calculation: 447 R Axis:   -15 Text  Interpretation: Sinus rhythm with Premature atrial complexes Nonspecific T wave abnormality Abnormal ECG When comapred to prior, similar apperance. No STEMI Confirmed by Antony Blackbird [phone]) on 10/15/2020 4:24:29 PM   Radiology CT HEAD WO CONTRAST  Result Date: 10/15/2020 CLINICAL DATA:  Head trauma, abnormal mental status (Age 81-64y) Progressive altered mental status. EXAM: CT HEAD WITHOUT CONTRAST TECHNIQUE: Contiguous axial images were obtained from the base of the skull through the vertex without intravenous contrast. COMPARISON:  Head CT yesterday, as well as 10/12/2020 FINDINGS: Brain: Despite repeat acquisition, there is motion artifact. No acute hemorrhage allowing for motion limitations. Stable degree of atrophy and chronic small vessel ischemia. Remote left posterior parietooccipital infarct. Stable remote lacunar infarcts in the basal ganglia versus prominent perivascular space. No evidence of acute or developing ischemia. No subdural or extra-axial collection. No hydrocephalus. Vascular: No obvious hyperdense vessel, motion artifact limitations. Skull: No fracture or focal lesion. Sinuses/Orbits: Moderately motion obscured. No acute findings. Paranasal sinuses and mastoid air cells are clear. Other: None. IMPRESSION: 1. Motion limited exam. No acute intracranial abnormality. No skull fracture. 2. Stable atrophy, chronic small vessel ischemia, and remote infarcts. Electronically Signed   By: Keith Rake M.D.   On: 10/15/2020 15:20   MR BRAIN WO CONTRAST  Result Date: 10/15/2020 CLINICAL DATA:  Left facial droop EXAM: MRI HEAD WITHOUT CONTRAST TECHNIQUE: Multiplanar, multiecho pulse sequences of the brain and surrounding structures were obtained without intravenous contrast. COMPARISON:  None. FINDINGS: Abbreviated protocol was used due to patient altered mental status, consisting of axial and coronal diffusion-weighted imaging. There is a small area  of acute ischemia within the right  corona radiata. Second focus of acute ischemia within the anteromedial right thalamus. There is generalized atrophy. Old left occipital lobe infarct. IMPRESSION: 1. Truncated examination due to patient altered mental status. 2. Small areas of acute ischemia within the right corona radiata and anteromedial right thalamus. Electronically Signed   By: Deatra Robinson M.D.   On: 10/15/2020 18:58   DG Chest Portable 1 View  Result Date: 10/15/2020 CLINICAL DATA:  Cough, dementia, altered level of consciousness EXAM: PORTABLE CHEST 1 VIEW COMPARISON:  10/14/2020 FINDINGS: Single frontal view of the chest demonstrates mild enlargement the cardiac silhouette. Large hiatal hernia again noted. No acute airspace disease, effusion, or pneumothorax. Cardiac monitor within the left anterior chest wall. IMPRESSION: 1. No acute intrathoracic process. 2. Large hiatal hernia. Electronically Signed   By: Sharlet Salina M.D.   On: 10/15/2020 17:30    Procedures Procedures (including critical care time)  Medications Ordered in ED Medications   stroke: mapping our early stages of recovery book (has no administration in time range)  acetaminophen (TYLENOL) tablet 650 mg (has no administration in time range)    Or  acetaminophen (TYLENOL) 160 MG/5ML solution 650 mg (has no administration in time range)    Or  acetaminophen (TYLENOL) suppository 650 mg (has no administration in time range)  senna-docusate (Senokot-S) tablet 1 tablet (has no administration in time range)  cefTRIAXone (ROCEPHIN) 1 g in sodium chloride 0.9 % 100 mL IVPB (has no administration in time range)  lactated ringers infusion (has no administration in time range)  lactated ringers bolus 500 mL (has no administration in time range)    ED Course  I have reviewed the triage vital signs and the nursing notes.  Pertinent labs & imaging results that were available during my care of the patient were reviewed by me and considered in my medical  decision making (see chart for details).    MDM Rules/Calculators/A&P                          Alyssa Odonnell is a 86 y.o. female with a past medical history significant for prior stroke with right-sided weakness at baseline, hypertension, paroxysmal atrial fibrillation on Eliquis therapy, hypothyroidism, hypercholesterolemia, mild dementia, osteoarthritis, and depression who presents for new left facial droop, hallucinations, and altered mental status.  According to son whom I spoke with on the phone, patient has had several falls over the last few days.  She fell on Christmas and was seen at another facility.  The family reports that she was evaluated and discharged back to her facility.  She says that she fell again yesterday when she was again screened and sent back home.  Son was unclear on what work-up was actually done during those visits however he reports that this morning, she woke up and is not acting like her normal self.  He reports that occasionally she gets confused but is never persistent.  He reports that this morning, she was talking to her dead parents in the room who were not there and she was more disoriented than her baseline.  He reports that this is all new for her.  He also says that her caregiver thought she was having a new left-sided facial droop which she has not had in the past.  She was last normal going to bed last night but is unclear what time that was.  Patient reports that she does feel slightly  confused and feels tired.  She does report a dry cough but denies any chest pain or shortness of breath.  She also says she is had some nausea and some diarrhea but denies vomiting.  She denies any urinary changes.  She denies any headache or neck pain but does report some low back pain that has resolved after her fall.  She is unsure about the hallucinations and she does not think she has had a facial droop in the past.  On exam, patient does have a subtle left facial droop.  It  spares the forehead.  Pupils are symmetric and reactive with normal extraocular movements.  Speech is clear to me.  Patient does have very subtle weakness and strength in her right leg and right arm compared to left but has intact sensation.  Normal finger-nose-finger testing in the left arm and some mild difficulty with weakness in the right arm completing it.  Lungs had some mild coarseness but chest and abdomen were nontender.  I spoke with the son who is very concerned about recurrent stroke versus other causes of her hallucinations and possible delirium.  We discussed we will get work-up to look for occult infection given the cough and fatigue.  We will get a MRI of her head as the CT head did not show any acute abnormalities or bleeds today from the falls.  She is on blood thinners and is on Eliquis.  I spoke with pharmacy after her INR and PT returned elevated but they felt that this was likely just related to the Eliquis.  Anticipate reassessment after work-up to determine disposition.  7:36 PM MRI does show evidence of acute stroke.  Urinalysis also shows leukocytes and bacteria.  There are no nitrites and she denied any urinary symptoms but given the delirium and hallucinations, will discuss with medicine about antibiotics or wait for urine culture.  Due to the acute stroke, will call neurology anticipate she will need admission given the hallucinations and delirium in the setting of acute stroke.  Neurology came to see the patient and agree with admission.  Medicine team will admit for further management.  I called and updated the patient's son [name] who agrees with plan.  Patient will be admitted for further management.   Final Clinical Impression(s) / ED Diagnoses Final diagnoses:  Altered mental status, unspecified altered mental status type  Cerebrovascular accident (CVA), unspecified mechanism ([place])  Hallucinations     Clinical Impression: 1. Altered mental status, unspecified  altered mental status type   2. Cerebrovascular accident (CVA), unspecified mechanism ([place])   3. Hallucinations     Disposition: Admit  This note was prepared with assistance of Dragon voice recognition software. Occasional wrong-word or sound-a-like substitutions may have occurred due to the inherent limitations of voice recognition software.     Sagrario Lineberry, Gwenyth Allegra, MD 10/15/20 [id number]

## 2020-10-15 NOTE — ED Notes (Signed)
Please call the son Casimiro Needle at [phone] he has important info he needs to let the RN know about the patient

## 2020-10-15 NOTE — Consult Note (Signed)
Neurology Consultation  Reason for Consult: Stroke Referring Physician: Dr. Alcario Drought, internal medicine/Dr. Tegeler, emergency medicine  CC: Altered mental status, facial droop  History is obtained from: Chart review  HPI: Alyssa Odonnell is a 86 y.o. female past medical history of a prior stroke with right-sided weakness at baseline, hypertension, paroxysmal atrial fibrillation Eliquis, hypothyroidism, hypercholesterolemia, dementia, osteoarthritis and depression presents for onset of left facial droop with a last known well sometime on Saturday afternoon. Patient started having increasing falls.  At baseline she is able to, with the assistance of the staff, be moved around.  Since last Saturday, she had been having difficult time moving and had multiple falls.  She also started hallucinating-talking to her dead parents.  Was taken to an outside hospital-unclear what work-up was done and discharged back to the facility.  Symptoms did not resolve and was brought in for further evaluation to [facility].  An MRI of the brain revealed a stroke as below. Neurology consultation for further recommendations   LKW: Unclear-Saturday, 10/11/2020 at some time tpa given?: no, outside the window Premorbid modified Rankin scale (mRS): No family member attendant available to clarify but based on the chart review 3-4.   ROS: Unable to obtain due to altered mentation  Past Medical History:  Diagnosis Date  . Acid reflux   . Acquired hammer toe   . Adenomatous colon polyp   . Alopecia   . Arthralgia of pelvic region and thigh   . Bunion   . Callus   . Cataract   . Gastric outlet obstruction 08/2019  . Glaucoma   . H/O acute bronchitis   . Hearing loss, sensorineural   . Hx of anaphylactic shock   . Hyperlipidemia   . Hypertension   . Hypothyroid   . Impingement syndrome of shoulder   . Incisional hernia, incarcerated   . Migraine headache   . Non-neoplastic nevus   . Osteoarthritis,  knee   . PAF (paroxysmal atrial fibrillation) ([place])   . Plantar fat pad atrophy   . RLS (restless legs syndrome)   . Stroke ([place])   . Tinea corporis   . UTI (urinary tract infection)   . Vitamin D deficiency         Family History  Problem Relation Age of Onset  . Osteoporosis Mother   . Hypertension Mother   . Heart failure Mother   . Stroke Father   . Heart disease Father   . Heart attack Father   . Cataracts Brother   . Colon cancer Paternal Aunt   . Glaucoma Paternal Aunt   . Dementia Neg Hx   . Alzheimer's disease Neg Hx      Social History:   reports that she has never smoked. She has never used smokeless tobacco. She reports that she does not drink alcohol and does not use drugs.  Medications  Current Facility-Administered Medications:  .   stroke: mapping our early stages of recovery book, , Does not apply, Once, Alcario Drought, Jared M, DO .  acetaminophen (TYLENOL) tablet 650 mg, 650 mg, Oral, Q4H PRN **OR** acetaminophen (TYLENOL) 160 MG/5ML solution 650 mg, 650 mg, Per Tube, Q4H PRN **OR** acetaminophen (TYLENOL) suppository 650 mg, 650 mg, Rectal, Q4H PRN, Etta Quill, DO .  cefTRIAXone (ROCEPHIN) 1 g in sodium chloride 0.9 % 100 mL IVPB, 1 g, Intravenous, Q24H, Gardner, Jared M, DO .  lactated ringers bolus 500 mL, 500 mL, Intravenous, Once, Alcario Drought, Jared M, DO .  lactated  ringers infusion, , Intravenous, Continuous, Alcario Drought, Jared M, DO .  senna-docusate (Senokot-S) tablet 1 tablet, 1 tablet, Oral, QHS PRN, Etta Quill, DO  Current Outpatient Medications:  .  acetaminophen (TYLENOL) 325 MG tablet, Take 650 mg by mouth every 6 (six) hours as needed (pain)., Disp: , Rfl:  .  amLODipine (NORVASC) 10 MG tablet, Take 10 mg by mouth daily., Disp: , Rfl:  .  carvedilol (COREG) 3.125 MG tablet, Take 3.125 mg by mouth 2 (two) times daily., Disp: , Rfl:  .  clopidogrel (PLAVIX) 75 MG tablet, Take 75 mg by mouth daily., Disp: , Rfl:  .  divalproex (DEPAKOTE  SPRINKLE) 125 MG capsule, Take 125-250 mg by mouth See admin instructions. 1 capsule in the morning and 2 capsules at bedtime., Disp: , Rfl:  .  ELIQUIS 5 MG TABS tablet, TAKE 1 TABLET BY MOUTH TWO  TIMES DAILY (Patient taking differently: Take 5 mg by mouth 2 (two) times daily.), Disp: 60 tablet, Rfl: 0 .  escitalopram (LEXAPRO) 20 MG tablet, Take 20 mg by mouth daily., Disp: , Rfl:  .  latanoprost (XALATAN) 0.005 % ophthalmic solution, Place 1 drop into both eyes at bedtime., Disp: , Rfl:  .  levothyroxine (SYNTHROID, LEVOTHROID) 100 MCG tablet, Take 100 mcg by mouth daily before breakfast. , Disp: , Rfl:  .  LORazepam (ATIVAN) 0.5 MG tablet, Take 0.5 mg by mouth 2 (two) times daily., Disp: , Rfl:  .  Multiple Vitamins-Minerals (CERTA-VITE PO), Take 1 tablet by mouth daily., Disp: , Rfl:  .  pantoprazole (PROTONIX) 40 MG tablet, Take 40 mg by mouth daily., Disp: , Rfl:  .  traMADol (ULTRAM) 50 MG tablet, Take 50 mg by mouth every 6 (six) hours., Disp: , Rfl:  .  VRAYLAR capsule, Take 3 mg by mouth daily., Disp: , Rfl:  .  amiodarone (PACERONE) 200 MG tablet, Take 1 tablet (200 mg total) by mouth daily. (Patient not taking: Reported on 10/15/2020), Disp: 30 tablet, Rfl: 0 .  divalproex (DEPAKOTE SPRINKLE) 125 MG capsule, Take 1 capsule (125 mg total) by mouth 2 (two) times daily. (Patient not taking: No sig reported), Disp: 60 capsule, Rfl: 3 .  lisinopril (ZESTRIL) 5 MG tablet, Take 5 mg by mouth daily. (Patient not taking: No sig reported), Disp: , Rfl:  .  nitrofurantoin, macrocrystal-monohydrate, (MACROBID) 100 MG capsule, Take 100 mg by mouth 2 (two) times daily. (Patient not taking: No sig reported), Disp: , Rfl:    Exam: Current vital signs: BP 130/69   Pulse 93   Temp 97.7 F (36.5 C) (Oral)   Resp 19   SpO2 100%  Vital signs in last 24 hours: Temp:  [97.7 F (36.5 C)] 97.7 F (36.5 C) (12/28 1431) Pulse Rate:  [87-97] 93 (12/28 1920) Resp:  [16-22] 19 (12/28 1920) BP:  (111-131)/(69-96) 130/69 (12/28 1920) SpO2:  [96 %-100 %] 100 % (12/28 1920) General: Sleeping in bed, opens eyes to voice, follows commands, in no distress HEENT: Normocephalic, atraumatic, dry oral mucous membranes Lungs: Clear to auscultation Cardiovascular: Regular rate rhythm Abdomen soft nondistended nontender Extremities warm well perfused Neurological exam Sleeping, wakes to voice, follows simple commands.  Is able to tell her name.  Unable to tell me the month or year.  Unable to tell me her date of birth. Hypophonic. I also question some dysarthria. Poor attention concentration Cranial nerve examination pupils equal round react light, extraocular movements intact, visual field full, facial symmetry appears preserved to me, facial sensation  intact, tongue and palate midline. Motor exam: Both upper extremities are antigravity with mildly increased tone with no drift.  Both lower extremities are barely antigravity and has drift in both-also poor attention concentration contributing to ability to follow this consistently. Sensory exam: Intact to touch and noxious stimulation Coordination: Difficult to assess NIH stroke scale 1a Level of Conscious.: 0 1b LOC Questions: 2 1c LOC Commands: 0 2 Best Gaze: 0 3 Visual: 0 4 Facial Palsy: 0 5a Motor Arm - left: 0 5b Motor Arm - Right: 0 6a Motor Leg - Left: 1 6b Motor Leg - Right: 1 7 Limb Ataxia: 0 8 Sensory: 0 9 Best Language: 0 10 Dysarthria: 1 11 Extinct. and Inatten.: 0 TOTAL: 3   Labs I have reviewed labs in epic and the results pertinent to this consultation are:   CBC    Component Value Date/Time   WBC 8.5 10/15/2020 1505   RBC 4.80 10/15/2020 1505   HGB 14.6 10/15/2020 1505   HGB 12.1 03/03/2018 1352   HCT 43.9 10/15/2020 1505   HCT 37.7 03/03/2018 1352   PLT 223 10/15/2020 1505   PLT 211 03/03/2018 1352   MCV 91.5 10/15/2020 1505   MCV 94 03/03/2018 1352   MCH 30.4 10/15/2020 1505   MCHC 33.3 10/15/2020  1505   RDW 12.5 10/15/2020 1505   RDW 14.4 03/03/2018 1352   LYMPHSABS 2.0 10/15/2020 1505   LYMPHSABS 2.4 03/03/2018 1352   MONOABS 0.7 10/15/2020 1505   EOSABS 0.3 10/15/2020 1505   EOSABS 0.2 03/03/2018 1352   BASOSABS 0.0 10/15/2020 1505   BASOSABS 0.0 03/03/2018 1352    CMP     Component Value Date/Time   NA 140 10/15/2020 1505   NA 141 03/03/2018 1352   K 4.2 10/15/2020 1505   CL 105 10/15/2020 1505   CO2 23 10/15/2020 1505   GLUCOSE 89 10/15/2020 1505   BUN 17 10/15/2020 1505   BUN 31 (H) 03/03/2018 1352   CREATININE 1.27 (H) 10/15/2020 1505   CALCIUM 9.6 10/15/2020 1505   PROT 7.0 10/15/2020 1505   ALBUMIN 3.6 10/15/2020 1505   AST 25 10/15/2020 1505   ALT 30 10/15/2020 1505   ALKPHOS 80 10/15/2020 1505   BILITOT 0.6 10/15/2020 1505   GFRNONAA 42 (L) 10/15/2020 1505   GFRAA 41 (L) 07/19/2020 1523   Urinalysis with question of UTI PT/INR-greater than 90/greater than 10  Imaging I have reviewed the images obtained:  CT-scan of the brain-Motion limited exam with no acute abnormality.  MRI examination of the brain-limited to DWI sequences-truncated exam-small areas of acute ischemia within the right corona radiata and anterior medial right thalamus.  Assessment:  86 year old with above past medical history presenting for evaluation of falls, altered mental status and hallucinations noted to have a acute right lower radiata/anterior medial right thalamus infarct. Has a history of paroxysmal atrial fibrillation-on anticoagulation. Likely cardioembolic. Also has UTI which might be contributing to AMS, as well as underlying dementia and damage from prior stroke.  Impression: Acute ischemic stroke-likely cardioembolic Coagulopathy-likely secondary to Eliquis UTI contributing to altered mentation in addition to the stroke.  Recommendations: Admit to hospitalist Telemetry Frequent neurochecks Due to her deranged renal function, I would do MRA of the head and  carotid Dopplers. Lipid panel A1c 2D echo PT OT speech therapy Bedside swallow eval-n.p.o. until clears bedside swallow evaluation or formal swallow evaluation. Symptoms have been ongoing for 3 days or so-no need for permissive hypertension.  Goal blood pressure below  140/90, which she has been maintaining. Likely also has UTI-medical management per primary team Coagulopathy due to being on Eliquis-no evidence of active CNS bleed.  No need for reversal.  Hold Eliquis.  Appreciate pharmacy consultation.  Discussed my plan with ED RPh and hospitalist Dr. Alcario Drought. Relayed my plan to the ED providers.  -- Amie Portland, MD [facility] Pager: [phone] If 7pm to 7am, please call on call as listed on AMION.

## 2020-10-15 NOTE — ED Notes (Signed)
INR >10 PTT >200

## 2020-10-15 NOTE — ED Triage Notes (Signed)
Pt arrives via [place] [facility] from [facility] with c/o increased AMS, hx of dementia and stroke, confused at baseline but staff advised pt can normally assist them to move around. LSW was Saturday afternoon, pt went to [place] [facility] for a fall sat (does take blood thinners). Also seen at [facility] yesterday for AMS but staff at facility unsure of what tests were done. Pt does have slight L sided facial droop, staff unsure when this began. No other neuro defs.

## 2020-10-16 ENCOUNTER — Inpatient Hospital Stay (HOSPITAL_COMMUNITY): Payer: Medicare Other

## 2020-10-16 ENCOUNTER — Other Ambulatory Visit: Payer: Self-pay

## 2020-10-16 DIAGNOSIS — I340 Nonrheumatic mitral (valve) insufficiency: Secondary | ICD-10-CM

## 2020-10-16 DIAGNOSIS — I350 Nonrheumatic aortic (valve) stenosis: Secondary | ICD-10-CM

## 2020-10-16 DIAGNOSIS — I639 Cerebral infarction, unspecified: Secondary | ICD-10-CM

## 2020-10-16 DIAGNOSIS — I6389 Other cerebral infarction: Secondary | ICD-10-CM

## 2020-10-16 DIAGNOSIS — I10 Essential (primary) hypertension: Secondary | ICD-10-CM | POA: Diagnosis not present

## 2020-10-16 LAB — CBC
HCT: 39.4 % (ref 36.0–46.0)
Hemoglobin: 12.5 g/dL (ref 12.0–15.0)
MCH: 29.5 pg (ref 26.0–34.0)
MCHC: 31.7 g/dL (ref 30.0–36.0)
MCV: 92.9 fL (ref 80.0–100.0)
Platelets: 196 10*3/uL (ref 150–400)
RBC: 4.24 MIL/uL (ref 3.87–5.11)
RDW: 12.3 % (ref 11.5–15.5)
WBC: 8.3 10*3/uL (ref 4.0–10.5)
nRBC: 0 % (ref 0.0–0.2)

## 2020-10-16 LAB — ECHOCARDIOGRAM COMPLETE
AR max vel: 1.27 cm2
AV Area VTI: 1.18 cm2
AV Area mean vel: 1.17 cm2
AV Mean grad: 12 mmHg
AV Peak grad: 22.6 mmHg
Ao pk vel: 2.38 m/s
Area-P 1/2: 2.91 cm2
S' Lateral: 2.7 cm

## 2020-10-16 LAB — BASIC METABOLIC PANEL
Anion gap: 13 (ref 5–15)
BUN: 17 mg/dL (ref 8–23)
CO2: 21 mmol/L — ABNORMAL LOW (ref 22–32)
Calcium: 9.3 mg/dL (ref 8.9–10.3)
Chloride: 105 mmol/L (ref 98–111)
Creatinine, Ser: 1.31 mg/dL — ABNORMAL HIGH (ref 0.44–1.00)
GFR, Estimated: 40 mL/min — ABNORMAL LOW (ref >60)
Glucose, Bld: 78 mg/dL (ref 70–99)
Potassium: 3.8 mmol/L (ref 3.5–5.1)
Sodium: 139 mmol/L (ref 135–145)

## 2020-10-16 LAB — LIPID PANEL
Cholesterol: 198 mg/dL (ref 0–200)
HDL: 36 mg/dL — ABNORMAL LOW (ref >40)
LDL Cholesterol: 144 mg/dL — ABNORMAL HIGH (ref 0–99)
Total CHOL/HDL Ratio: 5.5 RATIO
Triglycerides: 92 mg/dL (ref <150)
VLDL: 18 mg/dL (ref 0–40)

## 2020-10-16 LAB — HEMOGLOBIN A1C
Hgb A1c MFr Bld: 4.9 % (ref 4.8–5.6)
Mean Plasma Glucose: 93.93 mg/dL

## 2020-10-16 LAB — PROTIME-INR
INR: 1.2 (ref 0.8–1.2)
Prothrombin Time: 14.7 seconds (ref 11.4–15.2)

## 2020-10-16 MED ORDER — APIXABAN 5 MG PO TABS
5.0000 mg | ORAL_TABLET | Freq: Two times a day (BID) | ORAL | Status: DC
  Administered 2020-10-16 – 2020-10-23 (×14): 5 mg via ORAL
  Filled 2020-10-16 (×14): qty 1

## 2020-10-16 MED ORDER — EZETIMIBE 10 MG PO TABS
10.0000 mg | ORAL_TABLET | Freq: Every day | ORAL | Status: DC
  Administered 2020-10-16 – 2020-10-23 (×8): 10 mg via ORAL
  Filled 2020-10-16 (×8): qty 1

## 2020-10-16 NOTE — Progress Notes (Signed)
STROKE TEAM PROGRESS NOTE   INTERVAL HISTORY No family at the bedside. Pt lying in bed, stated that she does not get out of bed to pee and she afraid of falling. I told her that she has Purivac, that she does not need to get out of bed to pee. She is still not fully orientated and seems to be confused and hallucinating that there are boys peeing in her room. No significant facial droop on my exam.   Vitals:   10/16/20 0800 10/16/20 0830 10/16/20 0900 10/16/20 0930  BP: (!) 159/99 119/64 131/68 135/64  Pulse: 92 81 85 86  Resp: (!) 21 (!) 24 15 (!) 22  Temp:      TempSrc:      SpO2: 100% 100% 98% 99%   CBC:  Recent Labs  Lab 10/15/20 1505 10/16/20 0341  WBC 8.5 8.3  NEUTROABS 5.6  --   HGB 14.6 12.5  HCT 43.9 39.4  MCV 91.5 92.9  PLT 223 [id number]   Basic Metabolic Panel:  Recent Labs  Lab 10/15/20 1505 10/16/20 0341  NA 140 139  K 4.2 3.8  CL 105 105  CO2 23 21*  GLUCOSE 89 78  BUN 17 17  CREATININE 1.27* 1.31*  CALCIUM 9.6 9.3   Lipid Panel:  Recent Labs  Lab 10/16/20 0341  CHOL 198  TRIG 92  HDL 36*  CHOLHDL 5.5  VLDL 18  LDLCALC 144*   HgbA1c:  Recent Labs  Lab 10/16/20 0341  HGBA1C 4.9   Lab Results  Component Value Date   INR 1.2 10/16/2020   INR >10.0 (HH) 10/15/2020   INR 1.0 05/19/2020    Urine Drug Screen: No results for input(s): LABOPIA, COCAINSCRNUR, LABBENZ, AMPHETMU, THCU, LABBARB in the last 168 hours.  Alcohol Level No results for input(s): ETH in the last 168 hours.  IMAGING past 24 hours CT HEAD WO CONTRAST  Result Date: 10/15/2020 CLINICAL DATA:  Head trauma, abnormal mental status (Age 58-64y) Progressive altered mental status. EXAM: CT HEAD WITHOUT CONTRAST TECHNIQUE: Contiguous axial images were obtained from the base of the skull through the vertex without intravenous contrast. COMPARISON:  Head CT yesterday, as well as 10/12/2020 FINDINGS: Brain: Despite repeat acquisition, there is motion artifact. No acute hemorrhage allowing  for motion limitations. Stable degree of atrophy and chronic small vessel ischemia. Remote left posterior parietooccipital infarct. Stable remote lacunar infarcts in the basal ganglia versus prominent perivascular space. No evidence of acute or developing ischemia. No subdural or extra-axial collection. No hydrocephalus. Vascular: No obvious hyperdense vessel, motion artifact limitations. Skull: No fracture or focal lesion. Sinuses/Orbits: Moderately motion obscured. No acute findings. Paranasal sinuses and mastoid air cells are clear. Other: None. IMPRESSION: 1. Motion limited exam. No acute intracranial abnormality. No skull fracture. 2. Stable atrophy, chronic small vessel ischemia, and remote infarcts. Electronically Signed   By: Keith Rake M.D.   On: 10/15/2020 15:20   MR ANGIO HEAD WO CONTRAST  Result Date: 10/16/2020 CLINICAL DATA:  Stroke follow-up EXAM: MRA HEAD WITHOUT CONTRAST TECHNIQUE: Angiographic images of the Circle of Willis were obtained using MRA technique without intravenous contrast. COMPARISON:  10/15/2020 brain MRI FINDINGS: POSTERIOR CIRCULATION: --Vertebral arteries: Normal --Inferior cerebellar arteries: Normal. --Basilar artery: Normal. --Superior cerebellar arteries: Normal. --Posterior cerebral arteries: Normal. ANTERIOR CIRCULATION: --Intracranial internal carotid arteries: Normal. --Anterior cerebral arteries (ACA): Multifocal signal loss, likely artifactual. No proximal occlusion. --Middle cerebral arteries (MCA): Multifocal signal loss, likely artifactual. No proximal occlusion. ANATOMIC VARIANTS: Fetal origin of the  right PCA. IMPRESSION: 1. No emergent large vessel occlusion. Electronically Signed   By: Ulyses Jarred M.D.   On: 10/16/2020 03:32   MR BRAIN WO CONTRAST  Result Date: 10/15/2020 CLINICAL DATA:  Left facial droop EXAM: MRI HEAD WITHOUT CONTRAST TECHNIQUE: Multiplanar, multiecho pulse sequences of the brain and surrounding structures were obtained without  intravenous contrast. COMPARISON:  None. FINDINGS: Abbreviated protocol was used due to patient altered mental status, consisting of axial and coronal diffusion-weighted imaging. There is a small area of acute ischemia within the right corona radiata. Second focus of acute ischemia within the anteromedial right thalamus. There is generalized atrophy. Old left occipital lobe infarct. IMPRESSION: 1. Truncated examination due to patient altered mental status. 2. Small areas of acute ischemia within the right corona radiata and anteromedial right thalamus. Electronically Signed   By: Ulyses Jarred M.D.   On: 10/15/2020 18:58   DG Chest Portable 1 View  Result Date: 10/15/2020 CLINICAL DATA:  Cough, dementia, altered level of consciousness EXAM: PORTABLE CHEST 1 VIEW COMPARISON:  10/14/2020 FINDINGS: Single frontal view of the chest demonstrates mild enlargement the cardiac silhouette. Large hiatal hernia again noted. No acute airspace disease, effusion, or pneumothorax. Cardiac monitor within the left anterior chest wall. IMPRESSION: 1. No acute intrathoracic process. 2. Large hiatal hernia. Electronically Signed   By: Randa Ngo M.D.   On: 10/15/2020 17:30   VAS US CAROTID (at [facility] and [facility] only)  Result Date: 10/16/2020 Carotid Arterial Duplex Study Indications:       CVA. Risk Factors:      Hypertension, hyperlipidemia, PAD. Comparison Study:  No prior studies. Performing Technologist: Darlin Coco, RDMS  Examination Guidelines: A complete evaluation includes B-mode imaging, spectral Doppler, color Doppler, and power Doppler as needed of all accessible portions of each vessel. Bilateral testing is considered an integral part of a complete examination. Limited examinations for reoccurring indications may be performed as noted.  Right Carotid Findings: +----------+--------+--------+--------+------------------+--------+           PSV cm/sEDV cm/sStenosisPlaque DescriptionComments  +----------+--------+--------+--------+------------------+--------+ CCA Prox  97      18                                         +----------+--------+--------+--------+------------------+--------+ CCA Distal62      15                                         +----------+--------+--------+--------+------------------+--------+ ICA Prox  69      21      1-39%   calcific                   +----------+--------+--------+--------+------------------+--------+ ICA Distal70      23                                         +----------+--------+--------+--------+------------------+--------+ ECA       96      13                                         +----------+--------+--------+--------+------------------+--------+ +----------+--------+-------+----------------+-------------------+  PSV cm/sEDV cmsDescribe        Arm Pressure (mmHG) +----------+--------+-------+----------------+-------------------+ Subclavian113            Multiphasic, WNL                    +----------+--------+-------+----------------+-------------------+ +---------+--------+--+--------+-+---------+ VertebralPSV cm/s35EDV cm/s8Antegrade +---------+--------+--+--------+-+---------+  Left Carotid Findings: +----------+--------+--------+--------+----------------------+--------+           PSV cm/sEDV cm/sStenosisPlaque Description    Comments +----------+--------+--------+--------+----------------------+--------+ CCA Prox  119     15                                             +----------+--------+--------+--------+----------------------+--------+ CCA Distal62      15                                             +----------+--------+--------+--------+----------------------+--------+ ICA Prox  87      27      1-39%   calcific and irregular         +----------+--------+--------+--------+----------------------+--------+ ICA Distal72      21                                              +----------+--------+--------+--------+----------------------+--------+ ECA       134     20                                             +----------+--------+--------+--------+----------------------+--------+ +----------+--------+--------+----------------+-------------------+           PSV cm/sEDV cm/sDescribe        Arm Pressure (mmHG) +----------+--------+--------+----------------+-------------------+ TO:[id number]             Multiphasic, WNL                    +----------+--------+--------+----------------+-------------------+ +---------+--------+--+--------+--+---------+ VertebralPSV cm/s80EDV cm/s18Antegrade +---------+--------+--+--------+--+---------+   Summary: Right Carotid: Velocities in the right ICA are consistent with a 1-39% stenosis. Left Carotid: Velocities in the left ICA are consistent with a 1-39% stenosis. Vertebrals:  Bilateral vertebral arteries demonstrate antegrade flow. Subclavians: Normal flow hemodynamics were seen in bilateral subclavian              arteries. *See table(s) above for measurements and observations.     Preliminary     PHYSICAL EXAM  Temp:  [98 F (36.7 C)] 98 F (36.7 C) (12/29 0330) Pulse Rate:  [73-104] 75 (12/29 1300) Resp:  [14-24] 14 (12/29 1300) BP: (106-159)/(63-99) 117/80 (12/29 1115) SpO2:  [95 %-100 %] 96 % (12/29 1300)  General - Well nourished, well developed, anxious with hallucination.  Ophthalmologic - fundi not visualized due to noncooperation.  Cardiovascular - Regular rhythm and rate, not in afib.  Neuro - awake alert, but anxious, with hallucination. Orientated to place, but not to age, or time. No aphasia but paucity of speech, no dysarthria, able to follow simple commands. Psychomotor slowing on exam. Visual field full, no gaze palsy, PERRL. No significant facial droop, slight left nasolabial fold flattening. Moving all extremities symmetrically. Sensation subjectively symmetrical, FTN grossly  intact.  Gait not tested.    ASSESSMENT/PLAN Alyssa Odonnell is a 86 y.o. female with history of prior stroke with right-sided weakness at baseline, hypertension, paroxysmal atrial fibrillation on Eliquis, hypothyroidism, hypercholesterolemia, dementia, osteoarthritis and depression presenting with L facial droop, increased falls and AMS w/ hallucinations since Saturday (x4 days)  Stroke:   Small R MCA/ACA and R BG/IC infarcts, embolic secondary to known AF on Eliquis, not sure about compliance  CT head No acute abnormality. Small vessel disease. Atrophy. Remote infarcts.   MRI  Small R corona radiata and anteromedial R thalamic infarcts.  MRA  No ELVO  Carotid Doppler unremarkable  2D Echo EF 60 to 65%  LDL 144  HgbA1c 4.9  VTE prophylaxis - SCDs   clopidogrel 75 mg daily and Eliquis (apixaban) daily prior to admission (aspirin allergy), now on Eliquis 5 mg twice daily given small size of infarct.  Continue on discharge  Therapy recommendations:  SNF   Disposition:  pending   Hx stroke/TIA  02/2017 - admitted for right HH, MRI showed left PCA infarct, possibly embolic.  CT head and neck left P2 occlusion.  EF 60 to 65%.  LDL 242, A1c 5.6.  Loop recorder placed.  Put on plavix d/t aspirin allergy  Atrial Fibrillation  Home anticoagulation:  Eliquis (apixaban) daily  . Hx loop in past, confirmed AF x 1, 1% burden, followed by Ladona Ridgel . Resume Eliquis given small size of infarct . 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 Edilia Bo, concerning for embolic event  Follows with Dr. Edilia Bo, ASA stopped and on plavix  Had follow up in 10/2016 with [facility] and was told stent re-occluded. plavix discontinued.  Hypertension  Stable . Long-term BP goal normotensive  Hyperlipidemia  Home meds:  None  Intolerant to multiple statins  Rash w/ Repatha   LDL 144, goal < 70  Now put on  zetia 10, continue on discharge  Recommend follow-up with lipid clinic  Other Stroke Risk Factors  Advanced Age >/= 55   Morbid Obesity, There is no height or weight on file to calculate BMI., BMI >/= 30 associated with increased stroke risk, recommend weight loss, diet and exercise as appropriate   Family hx stroke (father)  Migraines  Other Active Problems  Baseline dementia w/ behavioral distrubance  UTI. UA WBC 21-50. UCx pending. Rocephin 12/28>>  CKD Cre 1.27->1.31 (stable)  Hospital day # 1  Neurology will sign off. Please call with questions. Pt will follow up with Megan NP at [facility] on 11/18/20. Thanks for the consult.  Marvel Plan, MD PhD Stroke Neurology 10/16/2020 6:37 PM  To contact Stroke Continuity provider, please refer to [email or web address]. After hours, contact General Neurology

## 2020-10-16 NOTE — Evaluation (Signed)
Occupational Therapy Evaluation Patient Details Name: Alyssa Odonnell MRN: [id number] DOB: 08-22-1934 Today's Date: 10/16/2020    History of Present Illness Pt presents for onset of left facial droop, hallucinations, and AMS.  She was found to have UTI.  MRI of brain showed acute ischemia within Rt corona radiata and anteromedial Rt thalamus.   past medical history includes:   prior stroke with right-sided weakness at baseline, hypertension, paroxysmal atrial fibrillation Eliquis, hypothyroidism, hypercholesterolemia, dementia, osteoarthritis and depression   Clinical Impression   Pt admitted with above. She demonstrates the below listed deficits and will benefit from continued OT to maximize safety and independence with BADLs.  Pt presents to OT with generalized weakness, impaired balance, impaired cognition.  She requires mod - max A for ADLs and mod A for functional transfers.  She was residing in an ALF, but unsure of her level of functioning.  Anticipate she may require SNF level rehab before return to ALF.  Will follow.       Follow Up Recommendations  SNF    Equipment Recommendations  None recommended by OT    Recommendations for Other Services       Precautions / Restrictions Precautions Precautions: Fall      Mobility Bed Mobility Overal bed mobility: Needs Assistance Bed Mobility: Supine to Sit;Sit to Supine     Supine to sit: Mod assist Sit to supine: Mod assist   General bed mobility comments: assist to move LEs off the EOB the bed    Transfers Overall transfer level: Needs assistance Equipment used: 1 person hand held assist Transfers: Sit to/from Stand;Stand Pivot Transfers Sit to Stand: Mod assist Stand pivot transfers: Mod assist       General transfer comment: assist to boost into standing and assist to extend trunk.  Pt maintains flexed posture with posterior bias    Balance Overall balance assessment: Needs assistance Sitting-balance support:  Feet supported Sitting balance-Leahy Scale: Poor Sitting balance - Comments: requires UE support and up to min A for static standing   Standing balance support: Single extremity supported Standing balance-Leahy Scale: Poor Standing balance comment: requires mod A for standing balance                           ADL either performed or assessed with clinical judgement   ADL Overall ADL's : Needs assistance/impaired Eating/Feeding: Maximal assistance;Bed level   Grooming: Wash/dry hands;Wash/dry face;Oral care;Moderate assistance;Sitting   Upper Body Bathing: Maximal assistance;Sitting   Lower Body Bathing: Maximal assistance;Sit to/from stand   Upper Body Dressing : Maximal assistance;Sitting   Lower Body Dressing: Maximal assistance;Sit to/from stand   Toilet Transfer: Moderate assistance;Stand-pivot;BSC   Toileting- Clothing Manipulation and Hygiene: Total assistance;Sit to/from stand Toileting - Clothing Manipulation Details (indicate cue type and reason): pt incontinent of urine and required assist with peri care     Functional mobility during ADLs: Moderate assistance       Vision   Additional Comments: pt unable to participate in formal assessment     Perception     Praxis      Pertinent Vitals/Pain Pain Assessment: No/denies pain     Hand Dominance Right   Extremity/Trunk Assessment Upper Extremity Assessment Upper Extremity Assessment: LUE deficits/detail;RUE deficits/detail;Generalized weakness RUE Deficits / Details: pt demonstrates AROM bil. shoulders - flexion ~70*   Lower Extremity Assessment Lower Extremity Assessment: Defer to PT evaluation   Cervical / Trunk Assessment Cervical / Trunk Assessment: Kyphotic  Communication Communication Communication: No difficulties   Cognition Arousal/Alertness: Awake/alert Behavior During Therapy: WFL for tasks assessed/performed;Impulsive Overall Cognitive Status: No family/caregiver present  to determine baseline cognitive functioning                                 General Comments: Pt oriented to self and [place] initially, then later stated she was at a man's home who takes care of animals.  She is not oriented to time or situation.   She can follow one step commands consistently with increased time.  she is able to sustain attention for up to 45 seconds   General Comments       Exercises     Shoulder Instructions      Home Living Family/patient expects to be discharged to:: Assisted living                                 Additional Comments: per chart review, pt resides at [facility]      Prior Functioning/Environment          Comments: Pt unable to provide info and no family present        OT Problem List: Decreased strength;Decreased range of motion;Decreased activity tolerance;Impaired balance (sitting and/or standing);Decreased cognition;Decreased safety awareness      OT Treatment/Interventions: Self-care/ADL training;Neuromuscular education;DME and/or AE instruction;Therapeutic activities;Cognitive remediation/compensation;Visual/perceptual remediation/compensation;Patient/family education;Balance training;Manual therapy    OT Goals(Current goals can be found in the care plan section) Acute Rehab OT Goals Patient Stated Goal: to figure out why I am peeing OT Goal Formulation: With patient Time For Goal Achievement: 10/30/20 Potential to Achieve Goals: Good ADL Goals Pt Will Perform Eating: with set-up;with supervision;sitting Pt Will Perform Grooming: with min assist;standing Pt Will Perform Upper Body Bathing: with min assist;sitting Pt Will Perform Lower Body Bathing: with mod assist;sit to/from stand Pt Will Transfer to Toilet: with mod assist;ambulating;regular height toilet;bedside commode;grab bars Pt Will Perform Toileting - Clothing Manipulation and hygiene: with mod assist;sit to/from stand  OT  Frequency: Min 2X/week   Barriers to D/C:            Co-evaluation              AM-PAC OT "6 Clicks" Daily Activity     Outcome Measure Help from another person eating meals?: A Lot Help from another person taking care of personal grooming?: A Lot Help from another person toileting, which includes using toliet, bedpan, or urinal?: A Lot Help from another person bathing (including washing, rinsing, drying)?: A Lot Help from another person to put on and taking off regular upper body clothing?: A Lot Help from another person to put on and taking off regular lower body clothing?: A Lot 6 Click Score: 12   End of Session Nurse Communication: Mobility status  Activity Tolerance: Patient tolerated treatment well Patient left: in bed;with call bell/phone within reach;with bed alarm set  OT Visit Diagnosis: Unsteadiness on feet (R26.81);Cognitive communication deficit (R41.841) Symptoms and signs involving cognitive functions: Cerebral infarction                Time: 1610-9604 OT Time Calculation (min): 21 min Charges:  OT General Charges $OT Visit: 1 Visit OT Evaluation $OT Eval Moderate Complexity: 1 Mod  Eber Jones., OTR/L Acute Rehabilitation Services Pager [phone] Office [phone]   Jeani Hawking M 10/16/2020, 5:24 PM

## 2020-10-16 NOTE — Progress Notes (Signed)
OT Cancellation Note  Patient Details Name: Alyssa Odonnell MRN: [id number] DOB: 11-01-1933   Cancelled Treatment:    Reason Eval/Treat Not Completed: Active bedrest order.  Will reattempt.  Eber Jones., OTR/L Acute Rehabilitation Services Pager [phone] Office [phone]   Jeani Hawking M 10/16/2020, 11:50 AM

## 2020-10-16 NOTE — ED Notes (Signed)
Lunch Tray Ordered @ 1038.  

## 2020-10-16 NOTE — Progress Notes (Signed)
Carotid duplex bilateral study completed.   Please see CV Proc for preliminary results.   Billee Balcerzak, RDMS  

## 2020-10-16 NOTE — Progress Notes (Signed)
  Echocardiogram 2D Echocardiogram has been performed.  Alyssa Odonnell 10/16/2020, 10:57 AM

## 2020-10-17 DIAGNOSIS — I10 Essential (primary) hypertension: Secondary | ICD-10-CM | POA: Diagnosis not present

## 2020-10-17 LAB — URINE CULTURE

## 2020-10-17 NOTE — TOC Initial Note (Signed)
Transition of Care (TOC) - Initial/Assessment Note    Patient Details  Name: Alyssa Odonnell MRN: [id number] Date of Birth: 04/16/1934  Transition of Care (TOC) CM/SW Contact:    ,  Jon, LCSW Phone Number: 10/17/2020, 2:01 PM  Clinical Narrative:  CSW met with pt regarding discharge plan.  Pt engaged but lethargic, was aware of recommendation for SNF and said she is agreeable to this.  Permission given to speak with son and to contact SNF.  Choice document left in room.  Pt reports she is vaccinated.  CSW spoke with son [name] by phone who is also in agreement with plan for SNF.  Son reports pt was at [facility] before and had bad experience: will not return there.  Discussed choice and he will be in the hospital tomorrow and will review choice document then.                  Expected Discharge Plan: Skilled Nursing Facility Barriers to Discharge: Continued Medical Work up,SNF Pending bed offer   Patient Goals and CMS Choice Patient states their goals for this hospitalization and ongoing recovery are:: "be able to walk again" CMS Medicare.gov Compare Post Acute Care list provided to:: Patient Choice offered to / list presented to : Patient  Expected Discharge Plan and Services Expected Discharge Plan: Skilled Nursing Facility     Post Acute Care Choice: Skilled Nursing Facility Living arrangements for the past 2 months: Assisted Living Facility ([facility])                                      Prior Living Arrangements/Services Living arrangements for the past 2 months: Assisted Living Facility ([facility]) Lives with:: Facility Resident Patient language and need for interpreter reviewed:: Yes        Need for Family Participation in Patient Care: Yes (Comment) Care giver support system in place?: Yes (comment) Current home services: Other (comment) (na) Criminal Activity/Legal Involvement Pertinent to Current  Situation/Hospitalization: No - Comment as needed  Activities of Daily Living      Permission Sought/Granted Permission sought to share information with : Family Supports,Facility Contact Representative Permission granted to share information with : Yes, Verbal Permission Granted  Share Information with NAME: son, [name]  Permission granted to share info w AGENCY: SNF        Emotional Assessment Appearance:: Appears stated age Attitude/Demeanor/Rapport: Sedated Affect (typically observed): Pleasant Orientation: : Oriented to Self,Oriented to Place Alcohol / Substance Use: Not Applicable Psych Involvement: No (comment)  Admission diagnosis:  Hallucinations [R44.3] Acute ischemic stroke (HCC) [I63.9] Altered mental status, unspecified altered mental status type [R41.82] Cerebrovascular accident (CVA), unspecified mechanism (HCC) [I63.9] Patient Active Problem List   Diagnosis Date Noted  . Acute ischemic stroke (HCC) 10/15/2020  . Elevated INR 10/15/2020  . Adjustment disorder with depressed mood 07/22/2020  . Dementia with behavioral disturbance (HCC)   . Open fracture of right zygomatic arch (HCC)   . Palliative care by specialist   . DNR (do not resuscitate)   . Malnutrition of moderate degree 10/02/2019  . Pressure injury of skin 10/01/2019  . Malnutrition (HCC) 10/01/2019  . Hypothyroid 10/01/2019  . Failure to thrive in adult 09/30/2019  . Pleural effusion, bilateral 09/30/2019  . Chronic anticoagulation 09/30/2019  . 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 Gastric outlet obstruction 08/29/2019  . PAF (paroxysmal atrial fibrillation) (HCC) 02/02/2018  .   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 Cerebrovascular accident (CVA) due to embolism of left posterior cerebral artery ([place]) 03/09/2017  . htn 03/09/2017  . Generalized weakness 03/09/2017  . Stroke-like symptom 03/09/2017  . Right sided weakness   . PAD (peripheral artery disease) ([place])   . Essential  hypertension   . Spinal stenosis of lumbar region   . Pure hypercholesterolemia   . Recurrent major depressive disorder ([place])    PCP:  Jenel Lucks, PA-C Pharmacy:   [facility], [address], [address] [place], [place] [address] Phone: [phone] Fax: [place], [place] [address] Phone: [phone] Fax: [phone]     Social Determinants of Health (SDOH) Interventions    Readmission Risk Interventions Readmission Risk Prevention Plan 10/02/2019  Post Dischage Appt Not Complete  Appt Comments plan for SNF  Medication Screening Complete  Transportation Screening Complete  Some recent data might be hidden

## 2020-10-17 NOTE — Evaluation (Signed)
Physical Therapy Evaluation Patient Details Name: Alyssa Odonnell MRN: [id number] DOB: 27-Jan-1934 Today's Date: 10/17/2020   History of Present Illness  Pt is an 86 year old female who presents for onset of left facial droop, hallucinations, several falls over the past few days, and AMS.  She was found to have UTI.  MRI of brain showed acute ischemia within Rt corona radiata and anteromedial Rt thalamus. No tPA administered. past medical history includes:   prior stroke with right-sided weakness at baseline, hypertension, paroxysmal atrial fibrillation Eliquis, hypothyroidism, hypercholesterolemia, dementia, osteoarthritis and depression  Clinical Impression  Pt presents with condition mentioned above and deficits mentioned below, see PT Problem List. Pt has a hx of CVA with residual R-sided weakness and now demonstrates L sided weakness also, resulting in decreased independence and safety with all functional mob. Her strength, sensation, and coordination in her legs appear to be close to symmetrical bilaterally, resulting in decreased bilat weight shifting and step length with gait. However, she does display decreased L step length compared to R. She easily fatigues and requires continual multi-modal single step commands to sequence all tasks. At baseline she has cognitive deficits and tends to only be oriented to herself. Pt's son reports desire to eventually take her home with 24/7 care from aides. Will continue to follow acutely. Recommending d/c initially to SNF to receive rehab services to maximize her independence and safety with functional mob prior to return home.     Follow Up Recommendations SNF;Supervision/Assistance - 24 hour    Equipment Recommendations  None recommended by PT    Recommendations for Other Services       Precautions / Restrictions Precautions Precautions: Fall Restrictions Weight Bearing Restrictions: No      Mobility  Bed Mobility Overal bed mobility: Needs  Assistance Bed Mobility: Supine to Sit     Supine to sit: HOB elevated;Mod assist     General bed mobility comments: Cues and extra time provided to manage legs off EOB and to ascend trunk with min initiation noted at legs. ModA to complete and square hips with EOB.    Transfers Overall transfer level: Needs assistance Equipment used: Rolling walker (2 wheeled) Transfers: Stand Pivot Transfers;Sit to/from Stand Sit to Stand: Min assist Stand pivot transfers: Min assist       General transfer comment: Sit to stand and stand step to L EOB to bedside recliner with minA and extra time to power up to stand and then cues and assistance provided to shift weight, sequence steps, and manage RW.  Ambulation/Gait Ambulation/Gait assistance: Min assist Gait Distance (Feet): 3 Feet Assistive device: Rolling walker (2 wheeled) Gait Pattern/deviations: Step-to pattern;Decreased step length - right;Decreased step length - left;Decreased stride length;Decreased weight shift to right;Decreased weight shift to left;Shuffle;Decreased dorsiflexion - right;Decreased dorsiflexion - left;Trunk flexed Gait velocity: decreased Gait velocity interpretation: <1.31 ft/sec, indicative of household ambulator General Gait Details: Ambulates with trunk flexed, taking small shuffling steps bilat with decreased step length on L compared to R. Cues and assistance provided to shift weight and inc step length bilat, with min success.  Stairs            Wheelchair Mobility    Modified Rankin (Stroke Patients Only) Modified Rankin (Stroke Patients Only) Pre-Morbid Rankin Score: Moderately severe disability Modified Rankin: Moderately severe disability     Balance Overall balance assessment: Needs assistance Sitting-balance support: Bilateral upper extremity supported;Feet supported Sitting balance-Leahy Scale: Poor Sitting balance - Comments: Reliant on UE support and min guard  for safety.   Standing  balance support: Bilateral upper extremity supported;During functional activity Standing balance-Leahy Scale: Poor Standing balance comment: Reliant on UE support and minA for balance.                             Pertinent Vitals/Pain Pain Assessment: Faces Faces Pain Scale: Hurts little more Pain Location: bilat calves which improved when compression device removed; headache Pain Descriptors / Indicators: Cramping;Discomfort;Grimacing Pain Intervention(s): Limited activity within patient's tolerance;Monitored during session;Repositioned;Patient requesting pain meds-RN notified    Home Living Family/patient expects to be discharged to:: Private residence Living Arrangements: Other (Comment) (2 nurse aides) Available Help at Discharge: Available 24 hours/day;Personal care attendant Type of Home: House Home Access: Ramped entrance     Home Layout: Two level;Able to live on main level with bedroom/bathroom Home Equipment: Gilford Rile - 2 wheels;Walker - 4 wheels;Bedside commode;Cane - single point;Wheelchair - manual;Walker - standard;Cane - quad;Crutches;Shower seat;Toilet riser;Grab bars - tub/shower;Grab bars - toilet;Hand held shower head;Hospital bed Additional Comments: Long-term plan is to return home. Son expects she may return to [place] in [place] short-term. Son plans to try to move next door when possible.    Prior Function Level of Independence: Needs assistance   Gait / Transfers Assistance Needed: Capable of performing all bed mob and transfers with independence but son reports better to have supervision for safety. Pt ambulates short distances with a standard walker with mod I - supervision.  ADL's / Homemaking Assistance Needed: Pt requires assistance for bathing and dressing, but fairly independent with utilizing restroom.        Hand Dominance   Dominant Hand: Right    Extremity/Trunk Assessment   Upper Extremity Assessment Upper Extremity  Assessment: Defer to OT evaluation    Lower Extremity Assessment Lower Extremity Assessment: RLE deficits/detail;LLE deficits/detail RLE Deficits / Details: MMT scores of the following: hip flexion 4-, knee extension 3+, knee flexion 3+, ankle dorsiflexion 4- RLE Sensation: decreased light touch (throughout which pt reports is her norm (unsure though as pt inconsistent with reports)) RLE Coordination: decreased fine motor;decreased gross motor (pt unable to follow directions to formally assess) LLE Deficits / Details: MMT scores of the following: hip flexion 4-, knee extension 3+, knee flexion 3+, ankle dorsiflexion 3+ LLE Sensation: decreased light touch (throughout which pt reports is her norm (unsure though as pt inconsistent with reports)) LLE Coordination: decreased fine motor;decreased gross motor (pt unable to follow directions to formally assess)    Cervical / Trunk Assessment Cervical / Trunk Assessment: Kyphotic  Communication   Communication: No difficulties  Cognition Arousal/Alertness: Lethargic Behavior During Therapy: WFL for tasks assessed/performed Overall Cognitive Status: History of cognitive impairments - at baseline                                 General Comments: Pt oriented to self but not date, location, or situation which son reports appears to be her baseline. Pt displays slow processing and decreased sequencing of tasks for all functional mob, requiring multi-modal cues throughout to maintain safety.      General Comments      Exercises     Assessment/Plan    PT Assessment Patient needs continued PT services  PT Problem List Decreased strength;Decreased activity tolerance;Decreased balance;Decreased mobility;Decreased coordination;Decreased cognition;Decreased knowledge of use of DME;Decreased safety awareness;Decreased knowledge of precautions;Impaired sensation;Pain  PT Treatment Interventions DME instruction;Gait  training;Functional mobility training;Therapeutic activities;Therapeutic exercise;Balance training;Neuromuscular re-education;Cognitive remediation;Patient/family education    PT Goals (Current goals can be found in the Care Plan section)  Acute Rehab PT Goals Patient Stated Goal: Ann Maki stated goal to return home PT Goal Formulation: With patient/family Time For Goal Achievement: 10/31/20 Potential to Achieve Goals: Good    Frequency Min 3X/week   Barriers to discharge        Co-evaluation               AM-PAC PT "6 Clicks" Mobility  Outcome Measure Help needed turning from your back to your side while in a flat bed without using bedrails?: A Lot Help needed moving from lying on your back to sitting on the side of a flat bed without using bedrails?: A Lot Help needed moving to and from a bed to a chair (including a wheelchair)?: A Little Help needed standing up from a chair using your arms (e.g., wheelchair or bedside chair)?: A Little Help needed to walk in hospital room?: A Lot Help needed climbing 3-5 steps with a railing? : Total 6 Click Score: 13    End of Session Equipment Utilized During Treatment: Gait belt Activity Tolerance: Patient limited by fatigue;Patient limited by lethargy Patient left: in chair;with call bell/phone within reach;with chair alarm set;with nursing/sitter in room Nurse Communication: Mobility status PT Visit Diagnosis: Unsteadiness on feet (R26.81);Other abnormalities of gait and mobility (R26.89);Muscle weakness (generalized) (M62.81);Difficulty in walking, not elsewhere classified (R26.2);Other symptoms and signs involving the nervous system (R29.898);Hemiplegia and hemiparesis;Pain Hemiplegia - Right/Left:  (bilat) Hemiplegia - dominant/non-dominant:  (bilat) Hemiplegia - caused by: Cerebral infarction Pain - Right/Left:  (headache) Pain - part of body:  (headache`)    Time: 4403-4742 PT Time Calculation (min) (ACUTE ONLY): 49  min   Charges:   PT Evaluation $PT Eval Moderate Complexity: 1 Mod PT Treatments $Therapeutic Activity: 23-37 mins        Raymond Gurney, PT, DPT Acute Rehabilitation Services  Pager: [phone] Office: [phone]   Jewel Baize 10/17/2020, 9:43 AM

## 2020-10-17 NOTE — Progress Notes (Signed)
PROGRESS NOTE  Alyssa Odonnell:[id number] DOB: [birth year] DOA: 10/15/2020 PCP: Kerin Salen, PA-C  Brief History   Alyssa Odonnell is a 86 y.o. female with medical history significant of prior stroke, baseline R sided weakness, HTN, PAF on eliquis, mild dementia, HLD.  Pt presents to ED for new L sided facial droop, hallucinations, AMS.  Per son: pt with several falls over last few days.  Fell on [place], seen at OSH, evaluated and discharged back to SNF.  Pt reports another fall yesterday.  Son reports pt woke up this AM and is not acting like her normal self.  Per son she gets confused but is never persistent like this.  This AM talking to dead parents in the room who wernt there.  This is new for her.  Pt reports that she does feel slightly confused and feels tired. She does report a dry cough but denies any chest pain or shortness of breath. She also says she is had some nausea and some diarrhea but denies vomiting. She denies any urinary changes. She denies any headache or neck pain but does report some low back pain that has resolved after her fall. She is unsure about the hallucinations and she does not think she has had a facial droop in the past.  Pt denies pain anywhere, hasnt noticed any blood in stool, denies N/V.  Work up in ED demonstrates UTI findings on UA and new acute ischemic strokes on MRI.  Also her INR > 10. HGB normal, no evidence of bleed anywhere at this point.  [facility] was consulted to admit the patient for further evaluation and care.  Consultants  . Neurology  Procedures  . None  Antibiotics   Anti-infectives (From admission, onward)   Start     Dose/Rate Route Frequency Ordered Stop   10/15/20 2115  cefTRIAXone (ROCEPHIN) 1 g in sodium chloride 0.9 % 100 mL IVPB        1 g 200 mL/hr over 30 Minutes Intravenous Every 24 hours 10/15/20 2107       Subjective  The patient is resting in bed. No new complaints.  Objective    Vitals:  Vitals:   10/17/20 1146 10/17/20 1531  BP: (!) 119/53 (!) 114/59  Pulse: 66 71  Resp: 17 18  Temp: (!) 97.5 F (36.4 C) 97.9 F (36.6 C)  SpO2: 98% 99%   Exam:  Constitutional:  The patient is awake and alert, but confused. Hallucinating yesterday. No acute distress. Respiratory:  . No increased work of breathing. . No wheezes, rales, or rhonchi . No tactile fremitus Cardiovascular:  . Regular rate and rhythm . No murmurs, ectopy, or gallups. . No lateral PMI. No thrills. Abdomen:  . Abdomen is soft, non-tender, non-distended . No hernias, masses, or organomegaly . Normoactive bowel sounds.  Musculoskeletal:  . No cyanosis, clubbing, or edema Skin:  . No rashes, lesions, ulcers . palpation of skin: no induration or nodules Neurologic:  . CN 2-12 intact . Sensation all 4 extremities intact Psychiatric:  . Mental status o Mood, affect appropriate o Orientation to person, place, time  . judgment and insight appear intact  I have personally reviewed the following:   Today's Data  . BMP, Lipid panel, CBC  Micro Data  . Urine culture with polymicrobial growth. Recollection is recommended.  Imaging  . MRA Head without contrast. . MRI brain . CT head  Cardiology Data  . EKG . Echocardiogram  Scheduled Meds: . apixaban  5 mg Oral BID  . cariprazine  3 mg Oral Daily  . carvedilol  3.125 mg Oral BID  . divalproex  125 mg Oral Daily   And  . divalproex  250 mg Oral QHS  . escitalopram  20 mg Oral Daily  . ezetimibe  10 mg Oral Daily  . latanoprost  1 drop Both Eyes QHS  . levothyroxine  100 mcg Oral QAC breakfast  . pantoprazole  40 mg Oral Daily   Continuous Infusions: . cefTRIAXone (ROCEPHIN)  IV Stopped (10/16/20 2251)  . lactated ringers 125 mL/hr at 10/17/20 0600    Principal Problem:   Acute ischemic stroke [facility]) Active Problems:   Essential hypertension   PAF (paroxysmal atrial fibrillation) (HCC)   Dementia with behavioral  disturbance (HCC)   Elevated INR   LOS: 2 days   A & P  Acute ischemic CVA: Neurology is consulted and the patient is on the stroke pathway. She is in a telemetry bed and being monitored in the SDU due to super elevated INR. Plavix held for this reason. Eliquis also held. INR down to 1.2 this morning. ? Lab error on admission. MRI confirms small areas of acute ischemia within the right corona radiata and anteromedial right thalamus. MRA demonstrates no emergent large vessel occlusion. Carotid ultrasound 1-39% stenosis of carotid arteries bilaterally. Antegrade flow in bilateral vertebral arteries . Normal flow hemodynamic in bilateral subclavian arteries. Echocardiogram has demonstrated EF of 60-65% with normal function of the left ventricle. Grade 1 diastolic dysfunction. Mild to moderate aortic valve stenosis. No intracardiac source of embolus. PT/OT to eval and treat. Neurology has recommended Zetia 10 mg daily and that the patient follow up with lipid clinic. He recommended restarting Eliquis.   Essential Hypertension: Blood pressures are on the low side with low dose Coreg only.  PAF: The patient's rate is controlled on Coreg. Eliquis has been restarted.  Dementia with behavioral disturbance: Delirium due to dementia and sundowning. Continue Depakote.  Elevated INR: Suspect lab error. Will recheck INR.  I have seen and examined this patient myself. I have spent 34 minutes in her evaluation and care.  DVT Prophylaxis: Eliquis restarted CODE STATUS: Full Code Family Communication: None available Disposition:  Status is: Inpatient  Remains inpatient appropriate because:Inpatient level of care appropriate due to severity of illness   Dispo: The patient is from: Home              Anticipated d/c is to: TBD              Anticipated d/c date is: 1 day              Patient currently is not medically stable to d/c.  Makaya Juneau, DO [facility] Direct contact: see [email or web address]   7PM-7AM contact night coverage as above 10/17/2020, 5:29 PM  LOS: 2 days

## 2020-10-17 NOTE — NC FL2 (Signed)
[place] LEVEL OF CARE SCREENING TOOL     IDENTIFICATION  Patient Name: Alyssa Odonnell Birthdate: 1934-01-04 Sex: female Admission Date (Current Location): 10/15/2020  [facility] [place] Number:  Herbalist and Address:  [facility] [place], [place] [address], [place], [place] [address]      Provider Number: [id number]  Attending Physician Name and Address:  Karie Kirks, DO  Relative Name and Phone Number:  Henriette Combs [phone]    Current Level of Care: Hospital Recommended Level of Care: [place] Prior Approval Number:    Date Approved/Denied:   PASRR Number: [id number]  Discharge Plan: SNF    Current Diagnoses: Patient Active Problem List   Diagnosis Date Noted  . Acute ischemic stroke ([place]) 10/15/2020  . Elevated INR 10/15/2020  . Adjustment disorder with depressed mood 07/22/2020  . Dementia with behavioral disturbance ([place])   . Open fracture of right zygomatic arch ([place])   . Palliative care by specialist   . DNR (do not resuscitate)   . Malnutrition of moderate degree 10/02/2019  . Pressure injury of skin 10/01/2019  . Malnutrition ([place]) 10/01/2019  . Hypothyroid 10/01/2019  . Failure to thrive in adult 09/30/2019  . Pleural effusion, bilateral 09/30/2019  . Chronic anticoagulation 09/30/2019  . 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 Gastric outlet obstruction 08/29/2019  . PAF (paroxysmal atrial fibrillation) ([place]) 02/02/2018  . 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 Cerebrovascular accident (CVA) due to embolism of left posterior cerebral artery ([place]) 03/09/2017  . htn 03/09/2017  . Generalized weakness 03/09/2017  . Stroke-like symptom 03/09/2017  . Right sided weakness   . PAD (peripheral artery disease) ([place])   . Essential hypertension   . Spinal stenosis of lumbar region   . Pure hypercholesterolemia   . Recurrent major depressive disorder (HCC)     Orientation RESPIRATION  BLADDER Height & Weight     Self,Place  Normal Continent Weight:   Height:  5\' 4"  (162.6 cm)  BEHAVIORAL SYMPTOMS/MOOD NEUROLOGICAL BOWEL NUTRITION STATUS      Continent Diet (Heart diet.  See discharge summary.)  AMBULATORY STATUS COMMUNICATION OF NEEDS Skin   Limited Assist Verbally Normal                       Personal Care Assistance Level of Assistance  Bathing,Feeding,Dressing Bathing Assistance: Maximum assistance Feeding assistance: Maximum assistance Dressing Assistance: Maximum assistance     Functional Limitations Info  Sight,Hearing,Speech Sight Info: Adequate Hearing Info: Adequate Speech Info: Adequate    SPECIAL CARE FACTORS FREQUENCY  PT (By licensed PT),OT (By licensed OT)     PT Frequency: 5x werek OT Frequency: 5x week            Contractures Contractures Info: Not present    Additional Factors Info  Code Status,Allergies Code Status Info: full Allergies Info: Aspirin, Atorvastatin, Nabumetone, Nsaids, Pravastatin, Procaine, Rosuvastatin, Simvastatin, Statins, Evolocumab, Mobic (Meloxicam), Penicillins           Current Medications (10/17/2020):  This is the current hospital active medication list Current Facility-Administered Medications  Medication Dose Route Frequency Provider Last Rate Last Admin  . acetaminophen (TYLENOL) tablet 650 mg  650 mg Oral Q4H PRN Etta Quill, DO   650 mg at 10/17/20 [id number]   Or  . acetaminophen (TYLENOL) 160 MG/5ML solution 650 mg  650 mg Per Tube Q4H PRN Etta Quill, DO       Or  .  acetaminophen (TYLENOL) suppository 650 mg  650 mg Rectal Q4H PRN Hillary Bow, DO      . apixaban Everlene Balls) tablet 5 mg  5 mg Oral BID Marvel Plan, MD   5 mg at 10/17/20 0911  . cariprazine (VRAYLAR) capsule 3 mg  3 mg Oral Daily Lyda Perone M, DO   3 mg at 10/17/20 5831  . carvedilol (COREG) tablet 3.125 mg  3.125 mg Oral BID Lyda Perone M, DO   3.125 mg at 10/17/20 6742  . cefTRIAXone (ROCEPHIN) 1 g in  sodium chloride 0.9 % 100 mL IVPB  1 g Intravenous Q24H Hillary Bow, DO   Stopped at 10/16/20 2251  . divalproex (DEPAKOTE SPRINKLE) capsule 125 mg  125 mg Oral Daily Hillary Bow, DO   125 mg at 10/17/20 5525   And  . divalproex (DEPAKOTE SPRINKLE) capsule 250 mg  250 mg Oral QHS Lyda Perone M, DO   250 mg at 10/16/20 2221  . escitalopram (LEXAPRO) tablet 20 mg  20 mg Oral Daily Lyda Perone M, DO   20 mg at 10/17/20 8948  . ezetimibe (ZETIA) tablet 10 mg  10 mg Oral Daily Marvel Plan, MD   10 mg at 10/17/20 0911  . lactated ringers infusion   Intravenous Continuous Hillary Bow, DO 125 mL/hr at 10/17/20 0600 Infusion Verify at 10/17/20 0600  . latanoprost (XALATAN) 0.005 % ophthalmic solution 1 drop  1 drop Both Eyes QHS Lyda Perone M, DO   1 drop at 10/16/20 2222  . levothyroxine (SYNTHROID) tablet 100 mcg  100 mcg Oral QAC breakfast Hillary Bow, DO   100 mcg at 10/17/20 0557  . pantoprazole (PROTONIX) EC tablet 40 mg  40 mg Oral Daily Lyda Perone M, DO   40 mg at 10/17/20 3475  . senna-docusate (Senokot-S) tablet 1 tablet  1 tablet Oral QHS PRN Hillary Bow, DO         Discharge Medications: Please see discharge summary for a list of discharge medications.  Relevant Imaging Results:  Relevant Lab Results:   Additional Information SSN#  [id number]  Lorri Frederick, LCSW

## 2020-10-17 NOTE — Plan of Care (Signed)
  Problem: Education: Goal: Knowledge of disease or condition will improve Outcome: Progressing Goal: Knowledge of secondary prevention will improve Outcome: Progressing   Problem: Coping: Goal: Will verbalize positive feelings about self Outcome: Progressing Goal: Will identify appropriate support needs Outcome: Progressing   

## 2020-10-17 NOTE — Progress Notes (Signed)
PROGRESS NOTE  Alyssa Odonnell KGM:[id number] DOB: 08/28/1934 DOA: 10/15/2020 PCP: Kerin Salen, PA-C  Brief History   Alyssa Odonnell is a 86 y.o. female with medical history significant of prior stroke, baseline R sided weakness, HTN, PAF on eliquis, mild dementia, HLD.  Pt presents to ED for new L sided facial droop, hallucinations, AMS.  Per son: pt with several falls over last few days.  Fell on [place], seen at OSH, evaluated and discharged back to SNF.  Pt reports another fall yesterday.  Son reports pt woke up this AM and is not acting like her normal self.  Per son she gets confused but is never persistent like this.  This AM talking to dead parents in the room who wernt there.  This is new for her.  Pt reports that she does feel slightly confused and feels tired. She does report a dry cough but denies any chest pain or shortness of breath. She also says she is had some nausea and some diarrhea but denies vomiting. She denies any urinary changes. She denies any headache or neck pain but does report some low back pain that has resolved after her fall. She is unsure about the hallucinations and she does not think she has had a facial droop in the past.  Pt denies pain anywhere, hasnt noticed any blood in stool, denies N/V.   ED Course: Work up in ED demonstrates UTI findings on UA and new acute ischemic strokes on MRI.  Also her INR > 10. HGB normal, no evidence of bleed anywhere at this point.  [facility] was consulted to admit the patient for further evaluation and care.  Consultants  . Neurology  Procedures  . None  Antibiotics   Anti-infectives (From admission, onward)   Start     Dose/Rate Route Frequency Ordered Stop   10/15/20 2115  cefTRIAXone (ROCEPHIN) 1 g in sodium chloride 0.9 % 100 mL IVPB        1 g 200 mL/hr over 30 Minutes Intravenous Every 24 hours 10/15/20 2107      .  Subjective  The patient is resting in bed. No new  complaints.  Objective   Vitals:  Vitals:   10/17/20 1146 10/17/20 1531  BP: (!) 119/53 (!) 114/59  Pulse: 66 71  Resp: 17 18  Temp: (!) 97.5 F (36.4 C) 97.9 F (36.6 C)  SpO2: 98% 99%   Exam:  Constitutional:  The patient is awake and alert, but confused. Hallucinating yesterday. No acute distress. Respiratory:  . No increased work of breathing. . No wheezes, rales, or rhonchi . No tactile fremitus Cardiovascular:  . Regular rate and rhythm . No murmurs, ectopy, or gallups. . No lateral PMI. No thrills. Abdomen:  . Abdomen is soft, non-tender, non-distended . No hernias, masses, or organomegaly . Normoactive bowel sounds.  Musculoskeletal:  . No cyanosis, clubbing, or edema Skin:  . No rashes, lesions, ulcers . palpation of skin: no induration or nodules Neurologic:  . CN 2-12 intact . Sensation all 4 extremities intact Psychiatric:  . Mental status o Mood, affect appropriate o Orientation to person, place, time  . judgment and insight appear intact  I have personally reviewed the following:   Today's Data  . BMP, Lipid panel, CBC  Micro Data  . Urine culture with polymicrobial growth. Recollection is recommended.  Imaging  . MRA Head without contrast. . MRI brain . CT head  Cardiology Data  . EKG . Echocardiogram  Scheduled  Meds: . apixaban  5 mg Oral BID  . cariprazine  3 mg Oral Daily  . carvedilol  3.125 mg Oral BID  . divalproex  125 mg Oral Daily   And  . divalproex  250 mg Oral QHS  . escitalopram  20 mg Oral Daily  . ezetimibe  10 mg Oral Daily  . latanoprost  1 drop Both Eyes QHS  . levothyroxine  100 mcg Oral QAC breakfast  . pantoprazole  40 mg Oral Daily   Continuous Infusions: . cefTRIAXone (ROCEPHIN)  IV Stopped (10/16/20 2251)  . lactated ringers 125 mL/hr at 10/17/20 0600    Principal Problem:   Acute ischemic stroke [facility] Active Problems:   Essential hypertension   PAF (paroxysmal atrial fibrillation) (HCC)    Dementia with behavioral disturbance (HCC)   Elevated INR   LOS: 2 days   A & P  Acute ischemic CVA: Neurology is consulted and the patient is on the stroke pathway. She is in a telemetry bed and being monitored in the SDU due to super elevated INR. Plavix held for this reason. Eliquis also held. INR down to 1.2 this morning. ? Lab error on admission. MRI confirms small areas of acute ischemia within the right corona radiata and anteromedial right thalamus. MRA demonstrates no emergent large vessel occlusion. Carotid ultrasound 1-39% stenosis of carotid arteries bilaterally. Antegrade flow in bilateral vertebral arteries . Normal flow hemodynamic in bilateral subclavian arteries. Echocardiogram has demonstrated EF of 60-65% with normal function of the left ventricle. Grade 1 diastolic dysfunction. Mild to moderate aortic valve stenosis. No intracardiac source of embolus. PT/OT to eval and treat. Neurology has recommended Zetia 10 mg daily and that the patient follow up with lipid clinic. He recommended restarting Eliquis.   Essential Hypertension: Blood pressures are on the low side with low dose Coreg only.  PAF: The patient's rate is controlled on Coreg. Eliquis has been restarted.  Dementia with behavioral disturbance: Delirium due to dementia and sundowning. Continue Depakote.  Elevated INR: Suspect lab error. Will recheck INR.  I have seen and examined this patient myself. I have spent 34 minutes in her evaluation and care.  DVT Prophylaxis: Eliquis restarted CODE STATUS: Full Code Family Communication: None available Disposition:  Status is: Inpatient  Remains inpatient appropriate because:Inpatient level of care appropriate due to severity of illness   Dispo: The patient is from: Home              Anticipated d/c is to: TBD              Anticipated d/c date is: 1 day              Patient currently is not medically stable to d/c.  Hina Gupta, DO [facility] Direct  contact: see [email or web address]  7PM-7AM contact night coverage as above 10/16/2020, 4:42 PM  LOS: 2 days

## 2020-10-18 DIAGNOSIS — I10 Essential (primary) hypertension: Secondary | ICD-10-CM | POA: Diagnosis not present

## 2020-10-18 LAB — CBC
HCT: 34.8 % — ABNORMAL LOW (ref 36.0–46.0)
Hemoglobin: 12.1 g/dL (ref 12.0–15.0)
MCH: 30.8 pg (ref 26.0–34.0)
MCHC: 34.8 g/dL (ref 30.0–36.0)
MCV: 88.5 fL (ref 80.0–100.0)
Platelets: 177 10*3/uL (ref 150–400)
RBC: 3.93 MIL/uL (ref 3.87–5.11)
RDW: 12.5 % (ref 11.5–15.5)
WBC: 6.5 10*3/uL (ref 4.0–10.5)
nRBC: 0 % (ref 0.0–0.2)

## 2020-10-18 LAB — BASIC METABOLIC PANEL
Anion gap: 10 (ref 5–15)
BUN: 8 mg/dL (ref 8–23)
CO2: 28 mmol/L (ref 22–32)
Calcium: 8.8 mg/dL — ABNORMAL LOW (ref 8.9–10.3)
Chloride: 103 mmol/L (ref 98–111)
Creatinine, Ser: 1.13 mg/dL — ABNORMAL HIGH (ref 0.44–1.00)
GFR, Estimated: 48 mL/min — ABNORMAL LOW (ref >60)
Glucose, Bld: 110 mg/dL — ABNORMAL HIGH (ref 70–99)
Potassium: 3.5 mmol/L (ref 3.5–5.1)
Sodium: 141 mmol/L (ref 135–145)

## 2020-10-18 LAB — PROTIME-INR
INR: 1.2 (ref 0.8–1.2)
Prothrombin Time: 15 seconds (ref 11.4–15.2)

## 2020-10-18 NOTE — Evaluation (Signed)
Speech Language Pathology Evaluation Patient Details Name: Alyssa Odonnell MRN: [id number] DOB: [birth year] Today's Date: 10/18/2020 Time: 4580-9983 SLP Time Calculation (min) (ACUTE ONLY): 13.8 min  Problem List:  Patient Active Problem List   Diagnosis Date Noted  . Acute ischemic stroke (HCC) 10/15/2020  . Elevated INR 10/15/2020  . Adjustment disorder with depressed mood 07/22/2020  . Dementia with behavioral disturbance (HCC)   . Open fracture of right zygomatic arch (HCC)   . Palliative care by specialist   . DNR (do not resuscitate)   . Malnutrition of moderate degree 10/02/2019  . Pressure injury of skin 10/01/2019  . Malnutrition (HCC) 10/01/2019  . Hypothyroid 10/01/2019  . Failure to thrive in adult 09/30/2019  . Pleural effusion, bilateral 09/30/2019  . Chronic anticoagulation 09/30/2019  . 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 Gastric outlet obstruction 08/29/2019  . PAF (paroxysmal atrial fibrillation) (HCC) 02/02/2018  . 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HCC) 05/24/2017  . Cerebrovascular accident (CVA) due to embolism of left posterior cerebral artery (HCC) 03/09/2017  . htn 03/09/2017  . Generalized weakness 03/09/2017  . Stroke-like symptom 03/09/2017  . Right sided weakness   . PAD (peripheral artery disease) (HCC)   . Essential hypertension   . Spinal stenosis of lumbar region   . Pure hypercholesterolemia   . Recurrent major depressive disorder [facility])    Past Medical History:  Past Medical History:  Diagnosis Date  . Acid reflux   . Acquired hammer toe   . Adenomatous colon polyp   . Alopecia   . Arthralgia of pelvic region and thigh   . Bunion   . Callus   . Cataract   . Gastric outlet obstruction 08/2019  . Glaucoma   . H/O acute bronchitis   . Hearing loss, sensorineural   . Hx of anaphylactic shock   . Hyperlipidemia   . Hypertension   . Hypothyroid   . Impingement syndrome of shoulder   . Incisional hernia, incarcerated   . Migraine headache   .  Non-neoplastic nevus   . Osteoarthritis, knee   . PAF (paroxysmal atrial fibrillation) (HCC)   . Plantar fat pad atrophy   . RLS (restless legs syndrome)   . Stroke (HCC)   . Tinea corporis   . UTI (urinary tract infection)   . Vitamin D deficiency    Past Surgical History:  Past Surgical History:  Procedure Laterality Date  . ABDOMINAL AORTOGRAM W/LOWER EXTREMITY Right 09/11/2019   Procedure: ABDOMINAL AORTOGRAM W/LOWER EXTREMITY;  Surgeon: Maeola Harman, MD;  Location: [facility] INVASIVE CV LAB;  Service: Cardiovascular;  Laterality: Right;  . ABDOMINAL HERNIA REPAIR    . CHOLECYSTECTOMY    . COLONOSCOPY    . GASTROSTOMY N/A 09/01/2019   Procedure: INSERTION OF GASTROSTOMY TUBE;  Surgeon: Harriette Bouillon, MD;  Location: [facility] OR;  Service: General;  Laterality: N/A;  . hernia repair w/ mesh    . HIATAL HERNIA REPAIR    . LAPAROSCOPIC LYSIS OF ADHESIONS  09/01/2019   Procedure: Laparoscopic Lysis Of Adhesions;  Surgeon: Harriette Bouillon, MD;  Location: [facility] OR;  Service: General;;  . LAPAROSCOPIC NISSEN FUNDOPLICATION N/A 09/01/2019   Procedure: LAPAROSCOPIC, POSSIBLE OPEN, GASTROPEXY;  Surgeon: Harriette Bouillon, MD;  Location: [facility] OR;  Service: General;  Laterality: N/A;  . LOOP RECORDER INSERTION N/A 03/11/2017   Procedure: Loop Recorder Insertion;  Surgeon: Marinus Maw, MD;  Location: [facility] INVASIVE CV LAB;  Service: Cardiovascular;  Laterality: N/A;  . NEUROPLASTY / TRANSPOSITION MEDIAN NERVE AT CARPAL  TUNNEL    . PERIPHERAL VASCULAR BALLOON ANGIOPLASTY Left 09/11/2019   Procedure: PERIPHERAL VASCULAR BALLOON ANGIOPLASTY;  Surgeon: Waynetta Sandy, MD;  Location: [place] CV LAB;  Service: Cardiovascular;  Laterality: Left;  TP trunk  . PERIPHERAL VASCULAR CATHETERIZATION Right 07/06/2016   Procedure: Lower Extremity Angiography;  Surgeon: Angelia Mould, MD;  Location: [place] CV LAB;  Service: Cardiovascular;  Laterality: Right;  . PERIPHERAL VASCULAR  CATHETERIZATION Right 07/06/2016   Procedure: Peripheral Vascular Intervention;  Surgeon: Angelia Mould, MD;  Location: [place] CV LAB;  Service: Cardiovascular;  Laterality: Right;  Superficial femoral  . PERIPHERAL VASCULAR INTERVENTION Left 09/11/2019   Procedure: PERIPHERAL VASCULAR INTERVENTION;  Surgeon: Waynetta Sandy, MD;  Location: [place] CV LAB;  Service: Cardiovascular;  Laterality: Left;  SFA  . skin lesion excision cheek/face    . TEE WITHOUT CARDIOVERSION N/A 03/11/2017   Procedure: TRANSESOPHAGEAL ECHOCARDIOGRAM (TEE);  Surgeon: Acie Fredrickson Wonda Cheng, MD;  Location: [facility] ENDOSCOPY;  Service: Cardiovascular;  Laterality: N/A;  . TOTAL HIP ARTHROPLASTY    . TUBAL LIGATION     HPI:  Pt is an 86 year old female who presents for onset of left facial droop, hallucinations, several falls over the past few days, and AMS.  She was found to have UTI.  MRI of brain showed acute ischemia within Rt corona radiata and anteromedial Rt thalamus. No tPA administered. past medical history includes:   prior stroke with right-sided weakness at baseline, hypertension, paroxysmal atrial fibrillation Eliquis, hypothyroidism, hypercholesterolemia, dementia, osteoarthritis and depression. SLE requested.   Assessment / Plan / Recommendation Clinical Impression  Pt with baseline cognitive impairment per chart review however no family present to determine true baseline. Pt is able to communicate wants/needs in short conversation. She stated that her "Daddy" was on his way here and that she lived at a school, but then stated that it was December 2021 and acknowledged that it would be her son coming. Suspect that Pt is at her baseline in terms of cognitive communication skills, requires supervision and plan is to return to SNF. No further acute SLP services indicated at this time.    SLP Assessment  SLP Recommendation/Assessment: Patient does not need any further Speech Lanaguage Pathology  Services    Follow Up Recommendations       Frequency and Duration           SLP Evaluation Cognition  Overall Cognitive Status: History of cognitive impairments - at baseline Arousal/Alertness: Awake/alert Orientation Level: Oriented to person;Oriented to place;Oriented to time;Disoriented to situation Memory: Impaired Awareness: Impaired Awareness Impairment: Emergent impairment Behaviors: Perseveration       Comprehension  Auditory Comprehension Overall Auditory Comprehension: Appears within functional limits for tasks assessed Yes/No Questions: Within Functional Limits Conversation: Simple Interfering Components: Working Curator: Not tested Reading Comprehension Reading Status: Not tested    Expression Expression Primary Mode of Expression: Verbal Verbal Expression Overall Verbal Expression: Appears within functional limits for tasks assessed Initiation: No impairment Automatic Speech: Name;Social Response Level of Generative/Spontaneous Verbalization: Sentence Naming: No impairment Pragmatics: No impairment Non-Verbal Means of Communication: Not applicable Written Expression Dominant Hand: Right Written Expression: Not tested   Oral / Motor  Oral Motor/Sensory Function Overall Oral Motor/Sensory Function: Within functional limits Motor Speech Overall Motor Speech: Appears within functional limits for tasks assessed Phonation: Normal Resonance: Within functional limits Articulation: Within functional limitis Intelligibility: Intelligible Motor Planning: Witnin functional limits Motor Speech Errors: Not applicable   Thank you,  Genene Churn, [place]                     [place] 10/18/2020, 12:46 PM

## 2020-10-18 NOTE — Plan of Care (Signed)
  Problem: Intracerebral Hemorrhage Tissue Perfusion: Goal: Complications of Intracerebral Hemorrhage will be minimized 10/18/2020 1858 by Melvenia Needles, RN Outcome: Progressing 10/18/2020 1855 by Melvenia Needles, RN Outcome: Progressing 10/18/2020 1220 by Melvenia Needles, RN Outcome: Progressing   Problem: Education: Goal: Knowledge of disease or condition will improve 10/18/2020 1858 by Melvenia Needles, RN Outcome: Progressing 10/18/2020 1855 by Melvenia Needles, RN Outcome: Progressing 10/18/2020 1220 by Melvenia Needles, RN Outcome: Progressing Goal: Knowledge of secondary prevention will improve 10/18/2020 1858 by Melvenia Needles, RN Outcome: Progressing 10/18/2020 1855 by Melvenia Needles, RN Outcome: Progressing 10/18/2020 1220 by Melvenia Needles, RN Outcome: Progressing   Problem: Ischemic Stroke/TIA Tissue Perfusion: Goal: Complications of ischemic stroke/TIA will be minimized 10/18/2020 1858 by Melvenia Needles, RN Outcome: Progressing 10/18/2020 1855 by Melvenia Needles, RN Outcome: Progressing 10/18/2020 1220 by Melvenia Needles, RN Outcome: Progressing

## 2020-10-18 NOTE — Progress Notes (Signed)
PROGRESS NOTE  Alyssa Odonnell KGY:[id number] DOB: June 21, 1934 DOA: 10/15/2020 PCP: Kerin Salen, PA-C  Brief History   Alyssa Odonnell is a 85 y.o. female with medical history significant of prior stroke, baseline R sided weakness, HTN, PAF on eliquis, mild dementia, HLD.  Pt presents to ED for new L sided facial droop, hallucinations, AMS.  Per son: pt with several falls over last few days.  Fell on [place], seen at OSH, evaluated and discharged back to SNF.  Pt reports another fall yesterday.  Son reports pt woke up this AM and is not acting like her normal self.  Per son she gets confused but is never persistent like this.  This AM talking to dead parents in the room who wernt there.  This is new for her.  Pt reports that she does feel slightly confused and feels tired. She does report a dry cough but denies any chest pain or shortness of breath. She also says she is had some nausea and some diarrhea but denies vomiting. She denies any urinary changes. She denies any headache or neck pain but does report some low back pain that has resolved after her fall. She is unsure about the hallucinations and she does not think she has had a facial droop in the past.  Pt denies pain anywhere, hasnt noticed any blood in stool, denies N/V.  Work up in ED demonstrates UTI findings on UA and new acute ischemic strokes on MRI.  Also her INR > 10. HGB normal, no evidence of bleed anywhere at this point.  [facility] was consulted to admit the patient for further evaluation and care.  Neurology was consulted. The patient underwent a stroke work up. She is in a telemetry bed and being monitored in the SDU due to super elevated INR. Plavix held for this reason. Eliquis also held. INR down to 1.2 on the morning of 10/17/2020. As this result was repeated the following morning of 10/18/2020. Lab error on admission. MRI confirms small areas of acute ischemia within the right corona radiata  and anteromedial right thalamus. MRA demonstrates no emergent large vessel occlusion. Carotid ultrasound 1-39% stenosis of carotid arteries bilaterally. Antegrade flow in bilateral vertebral arteries . Normal flow hemodynamic in bilateral subclavian arteries. Echocardiogram has demonstrated EF of 60-65% with normal function of the left ventricle. Grade 1 diastolic dysfunction. Mild to moderate aortic valve stenosis. No intracardiac source of embolus. PT/OT to eval and treat. Neurology has recommended Zetia 10 mg daily and that the patient follow up with lipid clinic. He recommended restarting Eliquis.  She has been evaluated by PT/OT. They have recommended SNF placement. TOC has been consulted and the patient is awaiting placement.  Consultants  . Neurology  Procedures  . None  Antibiotics   Anti-infectives (From admission, onward)   Start     Dose/Rate Route Frequency Ordered Stop   10/15/20 2115  cefTRIAXone (ROCEPHIN) 1 g in sodium chloride 0.9 % 100 mL IVPB        1 g 200 mL/hr over 30 Minutes Intravenous Every 24 hours 10/15/20 2107       Subjective  The patient is resting in bed. No new complaints.  Objective   Vitals:  Vitals:   10/18/20 1137 10/18/20 1533  BP: 130/74 131/71  Pulse: 73 85  Resp: 14 17  Temp: 98.8 F (37.1 C) 98 F (36.7 C)  SpO2: 98% 98%   Exam:  Constitutional:  The patient is awake and alert. No acute  distress. Respiratory:  . No increased work of breathing. . No wheezes, rales, or rhonchi . No tactile fremitus Cardiovascular:  . Regular rate and rhythm . No murmurs, ectopy, or gallups. . No lateral PMI. No thrills. Abdomen:  . Abdomen is soft, non-tender, non-distended . No hernias, masses, or organomegaly . Normoactive bowel sounds.  Musculoskeletal:  . No cyanosis, clubbing, or edema Skin:  . No rashes, lesions, ulcers . palpation of skin: no induration or nodules Neurologic:  . CN 2-12 intact . Sensation all 4 extremities  intact Psychiatric:  . Mental status o Mood, affect appropriate o Orientation to person, place, time  . judgment and insight appear intact  I have personally reviewed the following:   Today's Data  . BMP, Lipid panel, CBC  Micro Data  . Urine culture with polymicrobial growth. Recollection is recommended.  Imaging  . MRA Head without contrast. . MRI brain . CT head  Cardiology Data  . EKG . Echocardiogram  Scheduled Meds: . apixaban  5 mg Oral BID  . cariprazine  3 mg Oral Daily  . carvedilol  3.125 mg Oral BID  . divalproex  125 mg Oral Daily   And  . divalproex  250 mg Oral QHS  . escitalopram  20 mg Oral Daily  . ezetimibe  10 mg Oral Daily  . latanoprost  1 drop Both Eyes QHS  . levothyroxine  100 mcg Oral QAC breakfast  . pantoprazole  40 mg Oral Daily   Continuous Infusions: . cefTRIAXone (ROCEPHIN)  IV 1 g (10/17/20 2122)  . lactated ringers 125 mL/hr at 10/18/20 1452    Principal Problem:   Acute ischemic stroke [facility]) Active Problems:   Essential hypertension   PAF (paroxysmal atrial fibrillation) (HCC)   Dementia with behavioral disturbance (HCC)   Elevated INR   LOS: 3 days   A & P  Acute ischemic CVA: Neurology is consulted and the patient is on the stroke pathway. She is in a telemetry bed and being monitored in the SDU due to super elevated INR. Plavix held for this reason. Eliquis also held. INR down to 1.2 this morning. ? Lab error on admission. MRI confirms small areas of acute ischemia within the right corona radiata and anteromedial right thalamus. MRA demonstrates no emergent large vessel occlusion. Carotid ultrasound 1-39% stenosis of carotid arteries bilaterally. Antegrade flow in bilateral vertebral arteries . Normal flow hemodynamic in bilateral subclavian arteries. Echocardiogram has demonstrated EF of 60-65% with normal function of the left ventricle. Grade 1 diastolic dysfunction. Mild to moderate aortic valve stenosis. No intracardiac  source of embolus. PT/OT to eval and treat. Neurology has recommended Zetia 10 mg daily and that the patient follow up with lipid clinic. He recommended restarting Eliquis. PT/OT has evaluated the patient. She is awaiting placement.  Essential Hypertension: Blood pressures are on the low side with low dose Coreg only.  PAF: The patient's rate is controlled on Coreg. Eliquis has been restarted.  Dementia with behavioral disturbance: Delirium due to dementia and sundowning. Continue Depakote.  Elevated INR: Suspect lab error. Will recheck INR.  I have seen and examined this patient myself. I have spent 32 minutes in her evaluation and care.  DVT Prophylaxis: Eliquis restarted CODE STATUS: Full Code Family Communication: None available Disposition:  Status is: Inpatient  Remains inpatient appropriate because:Inpatient level of care appropriate due to severity of illness   Dispo: The patient is from: Home  Anticipated d/c is to: TBD              Anticipated d/c date is: 1 day              Patient currently is not medically stable to d/c.  Madisan Bice, DO [facility] Direct contact: see [email or web address]  7PM-7AM contact night coverage as above 10/18/2020, 4:52 PM  LOS: 2 days

## 2020-10-18 NOTE — Plan of Care (Signed)
  Problem: Coping: Goal: Will verbalize positive feelings about self 10/18/2020 1855 by Melvenia Needles, RN Outcome: Progressing 10/18/2020 1220 by Melvenia Needles, RN Outcome: Progressing 10/18/2020 1219 by Melvenia Needles, RN Outcome: Progressing Goal: Will identify appropriate support needs 10/18/2020 1855 by Melvenia Needles, RN Outcome: Progressing 10/18/2020 1220 by Melvenia Needles, RN Outcome: Progressing 10/18/2020 1219 by Melvenia Needles, RN Outcome: Progressing   Problem: Education: Goal: Knowledge of disease or condition will improve 10/18/2020 1855 by Melvenia Needles, RN Outcome: Progressing 10/18/2020 1220 by Melvenia Needles, RN Outcome: Progressing Goal: Knowledge of secondary prevention will improve 10/18/2020 1855 by Melvenia Needles, RN Outcome: Progressing 10/18/2020 1220 by Melvenia Needles, RN Outcome: Progressing   Problem: Ischemic Stroke/TIA Tissue Perfusion: Goal: Complications of ischemic stroke/TIA will be minimized 10/18/2020 1855 by Melvenia Needles, RN Outcome: Progressing 10/18/2020 1220 by Melvenia Needles, RN Outcome: Progressing

## 2020-10-18 NOTE — Care Management Important Message (Signed)
Important Message  Patient Details  Name: Alyssa Odonnell MRN: [id number] Date of Birth: 1934-07-15   Medicare Important Message Given:  Yes     Marciano Mundt [place] 10/18/2020, 2:47 PM

## 2020-10-18 NOTE — Plan of Care (Signed)
  Problem: Spontaneous Subarachnoid Hemorrhage Tissue Perfusion: Goal: Complications of Spontaneous Subarachnoid Hemorrhage will be minimized Outcome: Progressing   Problem: Ischemic Stroke/TIA Tissue Perfusion: Goal: Complications of ischemic stroke/TIA will be minimized Outcome: Progressing   Problem: Coping: Goal: Will verbalize positive feelings about self 10/18/2020 1220 by Melvenia Needles, RN Outcome: Progressing 10/18/2020 1219 by Melvenia Needles, RN Outcome: Progressing Goal: Will identify appropriate support needs 10/18/2020 1220 by Melvenia Needles, RN Outcome: Progressing 10/18/2020 1219 by Melvenia Needles, RN Outcome: Progressing   Problem: Education: Goal: Knowledge of disease or condition will improve 10/18/2020 1220 by Melvenia Needles, RN Outcome: Progressing 10/18/2020 1219 by Melvenia Needles, RN Outcome: Progressing Goal: Knowledge of secondary prevention will improve 10/18/2020 1220 by Melvenia Needles, RN Outcome: Progressing 10/18/2020 1219 by Melvenia Needles, RN Outcome: Progressing

## 2020-10-19 NOTE — Progress Notes (Addendum)
PROGRESS NOTE  Alyssa Odonnell [id number] DOB: 07/28/1934 DOA: 10/15/2020 PCP: Jenel Lucks, PA-C  Brief History   Alyssa Odonnell is a 87 y.o. female with medical history significant of prior stroke, baseline R sided weakness, HTN, PAF on eliquis, mild dementia, HLD.  Pt presents to ED for new L sided facial droop, hallucinations, AMS.  Per son: pt with several falls over last few days.  Fell on [place], seen at OSH, evaluated and discharged back to SNF.  Pt reports another fall yesterday.  Son reports pt woke up this AM and is not acting like her normal self.  Per son she gets confused but is never persistent like this.  This AM talking to dead parents in the room who wernt there.  This is new for her.  Pt reports that she does feel slightly confused and feels tired. She does report a dry cough but denies any chest pain or shortness of breath. She also says she is had some nausea and some diarrhea but denies vomiting. She denies any urinary changes. She denies any headache or neck pain but does report some low back pain that has resolved after her fall. She is unsure about the hallucinations and she does not think she has had a facial droop in the past.  Pt denies pain anywhere, hasnt noticed any blood in stool, denies N/V.  Work up in ED demonstrates UTI findings on UA and new acute ischemic strokes on MRI.  Also her INR > 10. HGB normal, no evidence of bleed anywhere at this point.  [facility] was consulted to admit the patient for further evaluation and care.  Neurology was consulted. The patient underwent a stroke work up. She is in a telemetry bed and being monitored in the SDU due to super elevated INR. Plavix held for this reason. Eliquis also held. INR down to 1.2 on the morning of 10/17/2020. As this result was repeated the following morning of 10/18/2020. Lab error on admission. MRI confirms small areas of acute ischemia within the right corona radiata  and anteromedial right thalamus. MRA demonstrates no emergent large vessel occlusion. Carotid ultrasound 1-39% stenosis of carotid arteries bilaterally. Antegrade flow in bilateral vertebral arteries . Normal flow hemodynamic in bilateral subclavian arteries. Echocardiogram has demonstrated EF of 60-65% with normal function of the left ventricle. Grade 1 diastolic dysfunction. Mild to moderate aortic valve stenosis. No intracardiac source of embolus. PT/OT to eval and treat. Neurology has recommended Zetia 10 mg daily and that the patient follow up with lipid clinic. He recommended restarting Eliquis.  She has been evaluated by PT/OT. They have recommended SNF placement. TOC has been consulted and the patient is awaiting placement.  Consultants  . Neurology  Procedures  . None  Antibiotics   Anti-infectives (From admission, onward)   Start     Dose/Rate Route Frequency Ordered Stop   10/15/20 2115  cefTRIAXone (ROCEPHIN) 1 g in sodium chloride 0.9 % 100 mL IVPB        1 g 200 mL/hr over 30 Minutes Intravenous Every 24 hours 10/15/20 2107       Subjective  The patient is resting in bed. No new complaints.  Objective   Vitals:  Vitals:   10/19/20 0815 10/19/20 1218  BP: 121/79 126/76  Pulse:  88  Resp:    Temp: 98.3 F (36.8 C) 98.3 F (36.8 C)  SpO2:  99%   Exam:  Constitutional:  The patient is awake and alert. No acute  distress. Respiratory:  . No increased work of breathing. . No wheezes, rales, or rhonchi . No tactile fremitus Cardiovascular:  . Regular rate and rhythm . No murmurs, ectopy, or gallups. . No lateral PMI. No thrills. Abdomen:  . Abdomen is soft, non-tender, non-distended . No hernias, masses, or organomegaly . Normoactive bowel sounds.  Musculoskeletal:  . No cyanosis, clubbing, or edema Skin:  . No rashes, lesions, ulcers . palpation of skin: no induration or nodules Neurologic:  . CN 2-12 intact . Sensation all 4 extremities  intact Psychiatric:  . Mental status o Mood, affect appropriate o Orientation to person, place, time  . judgment and insight appear intact  I have personally reviewed the following:   Today's Data  . BMP, Lipid panel, CBC  Micro Data  . Urine culture with polymicrobial growth. Recollection is recommended.  Imaging  . MRA Head without contrast. . MRI brain . CT head  Cardiology Data  . EKG . Echocardiogram  Scheduled Meds: . apixaban  5 mg Oral BID  . cariprazine  3 mg Oral Daily  . carvedilol  3.125 mg Oral BID  . divalproex  125 mg Oral Daily   And  . divalproex  250 mg Oral QHS  . escitalopram  20 mg Oral Daily  . ezetimibe  10 mg Oral Daily  . latanoprost  1 drop Both Eyes QHS  . levothyroxine  100 mcg Oral QAC breakfast  . pantoprazole  40 mg Oral Daily   Continuous Infusions: . cefTRIAXone (ROCEPHIN)  IV 1 g (10/18/20 2126)  . lactated ringers 125 mL/hr at 10/19/20 0201    Principal Problem:   Acute ischemic stroke [facility]) Active Problems:   Essential hypertension   PAF (paroxysmal atrial fibrillation) (HCC)   Dementia with behavioral disturbance (HCC)   Elevated INR   LOS: 4 days   A & P  Acute ischemic CVA: Neurology is consulted and the patient is on the stroke pathway. She is in a telemetry bed and being monitored in the SDU due to super elevated INR. Plavix held for this reason. Eliquis also held. INR down to 1.2 this morning. ? Lab error on admission. MRI confirms small areas of acute ischemia within the right corona radiata and anteromedial right thalamus. MRA demonstrates no emergent large vessel occlusion. Carotid ultrasound 1-39% stenosis of carotid arteries bilaterally. Antegrade flow in bilateral vertebral arteries . Normal flow hemodynamic in bilateral subclavian arteries. Echocardiogram has demonstrated EF of 60-65% with normal function of the left ventricle. Grade 1 diastolic dysfunction. Mild to moderate aortic valve stenosis. No intracardiac  source of embolus. PT/OT to eval and treat. Neurology has recommended Zetia 10 mg daily and that the patient follow up with lipid clinic. He recommended restarting Eliquis. PT/OT has evaluated the patient. She is awaiting placement.  Essential Hypertension: Blood pressures are on the low side with low dose Coreg only.  PAF: The patient's rate is controlled on Coreg. Eliquis has been restarted.  Dementia with behavioral disturbance: Delirium due to dementia and sundowning. Continue Depakote.  Elevated INR: Suspect lab error. Will recheck INR.  I have seen and examined this patient myself. I have spent 32 minutes in her evaluation and care.  DVT Prophylaxis: Eliquis restarted CODE STATUS: Full Code Family Communication: None available Disposition:  Status is: Inpatient  Remains inpatient appropriate because:Inpatient level of care appropriate due to severity of illness   Dispo: The patient is from: Home  Anticipated d/c is to: TBD              Anticipated d/c date is: 1 day              Patient currently is not medically stable to d/c.  Josey Dettmann, DO [facility] Direct contact: see [email or web address]  7PM-7AM contact night coverage as above 10/19/2020, 2:12 PM  LOS: 2 days

## 2020-10-19 NOTE — Progress Notes (Signed)
Physical Therapy Treatment Patient Details Name: Alyssa Odonnell MRN: [id number] DOB: 1934-02-19 Today's Date: 10/19/2020    History of Present Illness Pt is an 87 year old female who presents for onset of left facial droop, hallucinations, several falls over the past few days, and AMS.  She was found to have UTI.  MRI of brain showed acute ischemia within Rt corona radiata and anteromedial Rt thalamus. No tPA administered. past medical history includes:   prior stroke with right-sided weakness at baseline, hypertension, paroxysmal atrial fibrillation Eliquis, hypothyroidism, hypercholesterolemia, dementia, osteoarthritis and depression    PT Comments    Pt tolerates treatment well despite back and knee pain. Pt has very poor cognition, disoriented to place and situation and has limited awareness of deficits. Pt also requries some physical assistance to transfer and to perform bed mobility. Pt will benefit from continued acute PT POC to improve mobility quality and to reduce falls risk. PT continues to recommend SNF placement as the pt have significant cognitive deficits and remains at a falls risk due to weakness and poor activity tolerance.  Follow Up Recommendations  SNF;Supervision/Assistance - 24 hour     Equipment Recommendations  None recommended by PT    Recommendations for Other Services       Precautions / Restrictions Precautions Precautions: Fall Restrictions Weight Bearing Restrictions: No    Mobility  Bed Mobility Overal bed mobility: Needs Assistance Bed Mobility: Supine to Sit     Supine to sit: Min assist;HOB elevated     General bed mobility comments: minA to pivot hips toward EOB  Transfers Overall transfer level: Needs assistance Equipment used: Rolling walker (2 wheeled) Transfers: Sit to/from [facility] Sit to Stand: Min assist Stand pivot transfers: Min assist          Ambulation/Gait Ambulation/Gait assistance: Min assist Gait  Distance (Feet): 15 Feet Assistive device: Rolling walker (2 wheeled) Gait Pattern/deviations: Step-to pattern Gait velocity: decreased Gait velocity interpretation: <1.31 ft/sec, indicative of household ambulator General Gait Details: pt with shortened step-to gait, initially with posterior lean but correct with tactile cueing, fatigues quickly and reports bilateral knee discomfort   Stairs             Wheelchair Mobility    Modified Rankin (Stroke Patients Only) Modified Rankin (Stroke Patients Only) Pre-Morbid Rankin Score: Moderately severe disability Modified Rankin: Moderately severe disability     Balance Overall balance assessment: Needs assistance Sitting-balance support: No upper extremity supported;Feet supported Sitting balance-Leahy Scale: Poor Sitting balance - Comments: reliant on minG-minA Postural control: Posterior lean Standing balance support: Bilateral upper extremity supported Standing balance-Leahy Scale: Poor Standing balance comment: reliant on BUE support of RW                            Cognition Arousal/Alertness: Awake/alert Behavior During Therapy: WFL for tasks assessed/performed Overall Cognitive Status: History of cognitive impairments - at baseline                                 General Comments: pt disoriented to place and situation, reduced awareness of deficits and safety. Follows commands well, slowed processing      Exercises      General Comments General comments (skin integrity, edema, etc.): VSS on RA      Pertinent Vitals/Pain Pain Assessment: Faces Faces Pain Scale: Hurts little more Pain Location: back Pain Descriptors /  Indicators: Grimacing Pain Intervention(s): Monitored during session    Home Living                      Prior Function            PT Goals (current goals can now be found in the care plan section) Acute Rehab PT Goals Patient Stated Goal: per son goal to  return home Progress towards PT goals: Progressing toward goals    Frequency    Min 3X/week      PT Plan Current plan remains appropriate    Co-evaluation              AM-PAC PT "6 Clicks" Mobility   Outcome Measure  Help needed turning from your back to your side while in a flat bed without using bedrails?: A Little Help needed moving from lying on your back to sitting on the side of a flat bed without using bedrails?: A Little Help needed moving to and from a bed to a chair (including a wheelchair)?: A Little Help needed standing up from a chair using your arms (e.g., wheelchair or bedside chair)?: A Little Help needed to walk in hospital room?: A Little Help needed climbing 3-5 steps with a railing? : A Lot 6 Click Score: 17    End of Session Equipment Utilized During Treatment: Gait belt Activity Tolerance: Patient tolerated treatment well Patient left: in chair;with call bell/phone within reach;with chair alarm set Nurse Communication: Mobility status PT Visit Diagnosis: Unsteadiness on feet (R26.81);Other abnormalities of gait and mobility (R26.89);Muscle weakness (generalized) (M62.81);Difficulty in walking, not elsewhere classified (R26.2);Other symptoms and signs involving the nervous system (R29.898);Hemiplegia and hemiparesis;Pain Hemiplegia - dominant/non-dominant:  (bilateral) Hemiplegia - caused by: Cerebral infarction Pain - Right/Left:  (back)     Time: 3536-1443 PT Time Calculation (min) (ACUTE ONLY): 24 min  Charges:  $Gait Training: 8-22 mins $Therapeutic Activity: 8-22 mins                     Arlyss Gandy, PT, DPT Acute Rehabilitation Pager: [phone]    Arlyss Gandy 10/19/2020, 4:58 PM

## 2020-10-19 NOTE — Plan of Care (Signed)
  Problem: Education: Goal: Knowledge of disease or condition will improve Outcome: Progressing Goal: Knowledge of secondary prevention will improve Outcome: Progressing   Problem: Coping: Goal: Will verbalize positive feelings about self Outcome: Progressing Goal: Will identify appropriate support needs Outcome: Progressing   Problem: Education: Goal: Knowledge of disease or condition will improve Outcome: Progressing Goal: Knowledge of secondary prevention will improve Outcome: Progressing   Problem: Coping: Goal: Will verbalize positive feelings about self Outcome: Progressing Goal: Will identify appropriate support needs Outcome: Progressing   Problem: Intracerebral Hemorrhage Tissue Perfusion: Goal: Complications of Intracerebral Hemorrhage will be minimized Outcome: Progressing   Problem: Ischemic Stroke/TIA Tissue Perfusion: Goal: Complications of ischemic stroke/TIA will be minimized Outcome: Progressing   Problem: Spontaneous Subarachnoid Hemorrhage Tissue Perfusion: Goal: Complications of Spontaneous Subarachnoid Hemorrhage will be minimized Outcome: Progressing

## 2020-10-20 DIAGNOSIS — I480 Paroxysmal atrial fibrillation: Secondary | ICD-10-CM | POA: Diagnosis not present

## 2020-10-20 DIAGNOSIS — I10 Essential (primary) hypertension: Secondary | ICD-10-CM | POA: Diagnosis not present

## 2020-10-20 DIAGNOSIS — R4182 Altered mental status, unspecified: Secondary | ICD-10-CM | POA: Diagnosis not present

## 2020-10-20 DIAGNOSIS — F0391 Unspecified dementia with behavioral disturbance: Secondary | ICD-10-CM | POA: Diagnosis not present

## 2020-10-20 LAB — BASIC METABOLIC PANEL
Anion gap: 12 (ref 5–15)
BUN: 6 mg/dL — ABNORMAL LOW (ref 8–23)
CO2: 22 mmol/L (ref 22–32)
Calcium: 9 mg/dL (ref 8.9–10.3)
Chloride: 108 mmol/L (ref 98–111)
Creatinine, Ser: 1.06 mg/dL — ABNORMAL HIGH (ref 0.44–1.00)
GFR, Estimated: 52 mL/min — ABNORMAL LOW (ref >60)
Glucose, Bld: 91 mg/dL (ref 70–99)
Potassium: 3.3 mmol/L — ABNORMAL LOW (ref 3.5–5.1)
Sodium: 142 mmol/L (ref 135–145)

## 2020-10-20 LAB — CBC WITH DIFFERENTIAL/PLATELET
Abs Immature Granulocytes: 0.02 10*3/uL (ref 0.00–0.07)
Basophils Absolute: 0 10*3/uL (ref 0.0–0.1)
Basophils Relative: 0 %
Eosinophils Absolute: 0.3 10*3/uL (ref 0.0–0.5)
Eosinophils Relative: 5 %
HCT: 29.5 % — ABNORMAL LOW (ref 36.0–46.0)
Hemoglobin: 9.6 g/dL — ABNORMAL LOW (ref 12.0–15.0)
Immature Granulocytes: 0 %
Lymphocytes Relative: 36 %
Lymphs Abs: 2 10*3/uL (ref 0.7–4.0)
MCH: 29.8 pg (ref 26.0–34.0)
MCHC: 32.5 g/dL (ref 30.0–36.0)
MCV: 91.6 fL (ref 80.0–100.0)
Monocytes Absolute: 0.6 10*3/uL (ref 0.1–1.0)
Monocytes Relative: 10 %
Neutro Abs: 2.7 10*3/uL (ref 1.7–7.7)
Neutrophils Relative %: 49 %
Platelets: 126 10*3/uL — ABNORMAL LOW (ref 150–400)
RBC: 3.22 MIL/uL — ABNORMAL LOW (ref 3.87–5.11)
RDW: 12.7 % (ref 11.5–15.5)
WBC: 5.6 10*3/uL (ref 4.0–10.5)
nRBC: 0 % (ref 0.0–0.2)

## 2020-10-20 NOTE — Progress Notes (Signed)
[facility]  PROGRESS NOTE  Alyssa Odonnell [id number] DOB: 26-Jul-1934 DOA: 10/15/2020 PCP: Jenel Lucks, PA-C Admit date - 10/15/2020   Admitting Physician Etta Quill, DO  Outpatient Primary MD for the patient is Jenel Lucks, PA-C  LOS - 5 Brief Narrative   Alyssa Odonnell is a 87 y.o. year old female with medical history significant for prior history of stroke with residual right-sided weakness at baseline, HTN, paroxysmal atrial fibrillation on Eliquis, hypothyroidism, HLD, dementia who presented with left facial droop, increased falls and altered mental status with hallucinations for 4 days and was found to have right thalamic infarcts on MRI. Hospital course: Stroke work-up carotid Dopplers were unremarkable, LDL 144, echo showed preserved EF patient was increased from Eliquis daily to twice daily dosing for stroke and atrial fibrillation as well as started on Zetia due to statin intolerance for HLD  Subjective  Has no acute complaints.  No acute events overnight A & P    Small right MCA/ACA and right BG/IC infarct, suspect embolic etiology from known atrial fibrillation on Eliquis but unclear adherence. -Continue Eliquis 5 mg twice daily on discharge (has aspirin allergy) -PT/OT recommends SNF. -Outpatient neurology follow-up with [facility] on 11/18/2020  UTI.  Multiple species noted on urine culture. -Completed 5-day course of IV ceftriaxone  Atrial fibrillation, rate controlled -Eliquis increased to twice daily dosing as mentioned above continue outpatient follow-up with cardiology -Continue Coreg  HLD -Added Zetia given statin intolerance  Hypertension.  Blood pressure at goal-continue Coreg  Hypothyroidism -Continue Synthroid  GERD, stable-continue Protonix  Dementia with behavioral disturbance stable.  Oriented to self, hospital, slow speech, still follows commands -continue Lexapro, Depakote, cariprazine  Elevated INR on  admission.  Suspect lab error.  Repeat INR within normal limits     Family Communication  : No family at bedside  Code Status : Full, discussed on day of admission  Disposition Plan  :  Patient is from home. Anticipated d/c date:  1-2. Barriers to d/c or necessity for inpatient status:  Awaiting SNF placement, medically  stable Consults  : Neurology  Procedures  : TTE, 12/29  DVT Prophylaxis  : Eliquis  MDM: The below labs and imaging reports were reviewed and summarized above.  Medication management as above.  Lab Results  Component Value Date   PLT 126 (L) 10/20/2020    Diet :  Diet Order            Diet Heart Room service appropriate? Yes; Fluid consistency: Thin  Diet effective now                  Inpatient Medications Scheduled Meds: . apixaban  5 mg Oral BID  . cariprazine  3 mg Oral Daily  . carvedilol  3.125 mg Oral BID  . divalproex  125 mg Oral Daily   And  . divalproex  250 mg Oral QHS  . escitalopram  20 mg Oral Daily  . ezetimibe  10 mg Oral Daily  . latanoprost  1 drop Both Eyes QHS  . levothyroxine  100 mcg Oral QAC breakfast  . pantoprazole  40 mg Oral Daily   Continuous Infusions: . lactated ringers 125 mL/hr at 10/20/20 1122   PRN Meds:.acetaminophen **OR** acetaminophen (TYLENOL) oral liquid 160 mg/5 mL **OR** acetaminophen, senna-docusate  Antibiotics  :   Anti-infectives (From admission, onward)   Start     Dose/Rate Route Frequency Ordered Stop   10/15/20 2115  cefTRIAXone (ROCEPHIN) 1 g  in sodium chloride 0.9 % 100 mL IVPB        1 g 200 mL/hr over 30 Minutes Intravenous Every 24 hours 10/15/20 2107 10/19/20 2254       Objective   Vitals:   10/20/20 0855 10/20/20 1200 10/20/20 1309 10/20/20 1719  BP: (!) 155/74  116/73 (!) 144/70  Pulse: 70  79 75  Resp: 16  17 20   Temp: 97.6 F (36.4 C)  97.6 F (36.4 C) 98.1 F (36.7 C)  TempSrc: Oral  Oral Oral  SpO2: 98%  97% 98%  Weight:  72.6 kg    Height:        SpO2: 98  %  Wt Readings from Last 3 Encounters:  10/20/20 72.6 kg  07/19/20 72.6 kg  05/19/20 59 kg     Intake/Output Summary (Last 24 hours) at 10/20/2020 1952 Last data filed at 10/20/2020 1748 Gross per 24 hour  Intake 390 ml  Output 1950 ml  Net -1560 ml    Physical Exam:     Awake Alert, Oriented X self place/hospital, slow speech, follows commands No new F.N deficits,  [place].AT, Normal respiratory effort on room air, CTAB RRR,No Gallops,Rubs or new Murmurs,  +ve B.Sounds, Abd Soft, No tenderness, No rebound, guarding or rigidity.    I have personally reviewed the following:   Data Reviewed:  CBC Recent Labs  Lab 10/15/20 1505 10/16/20 0341 10/18/20 1014 10/20/20 0304  WBC 8.5 8.3 6.5 5.6  HGB 14.6 12.5 12.1 9.6*  HCT 43.9 39.4 34.8* 29.5*  PLT 223 196 177 126*  MCV 91.5 92.9 88.5 91.6  MCH 30.4 29.5 30.8 29.8  MCHC 33.3 31.7 34.8 32.5  RDW 12.5 12.3 12.5 12.7  LYMPHSABS 2.0  --   --  2.0  MONOABS 0.7  --   --  0.6  EOSABS 0.3  --   --  0.3  BASOSABS 0.0  --   --  0.0    Chemistries  Recent Labs  Lab 10/15/20 1505 10/16/20 0341 10/18/20 1014 10/20/20 0304  NA 140 139 141 142  K 4.2 3.8 3.5 3.3*  CL 105 105 103 108  CO2 23 21* 28 22  GLUCOSE 89 78 110* 91  BUN 17 17 8  6*  CREATININE 1.27* 1.31* 1.13* 1.06*  CALCIUM 9.6 9.3 8.8* 9.0  AST 25  --   --   --   ALT 30  --   --   --   ALKPHOS 80  --   --   --   BILITOT 0.6  --   --   --    ------------------------------------------------------------------------------------------------------------------ No results for input(s): CHOL, HDL, LDLCALC, TRIG, CHOLHDL, LDLDIRECT in the last 72 hours.  Lab Results  Component Value Date   HGBA1C 4.9 10/16/2020   ------------------------------------------------------------------------------------------------------------------ No results for input(s): TSH, T4TOTAL, T3FREE, THYROIDAB in the last 72 hours.  Invalid input(s):  FREET3 ------------------------------------------------------------------------------------------------------------------ No results for input(s): VITAMINB12, FOLATE, FERRITIN, TIBC, IRON, RETICCTPCT in the last 72 hours.  Coagulation profile Recent Labs  Lab 10/15/20 1505 10/16/20 0341 10/18/20 1014  INR >10.0* 1.2 1.2    No results for input(s): DDIMER in the last 72 hours.  Cardiac Enzymes No results for input(s): CKMB, TROPONINI, MYOGLOBIN in the last 168 hours.  Invalid input(s): CK ------------------------------------------------------------------------------------------------------------------    Component Value Date/Time   BNP 123.1 (H) 09/30/2019 1328    Micro Results Recent Results (from the past 240 hour(s))  Urine culture     Status: Abnormal  Collection Time: 10/15/20  5:46 PM   Specimen: Urine, Random  Result Value Ref Range Status   Specimen Description URINE, RANDOM  Final   Special Requests   Final    NONE Performed at [facility], [address] [place], [place] [address]    Culture MULTIPLE SPECIES PRESENT, SUGGEST RECOLLECTION (A)  Final   Report Status 10/17/2020 FINAL  Final  Resp Panel by RT-PCR (Flu A&B, Covid) Nasopharyngeal Swab     Status: None   Collection Time: 10/15/20  5:46 PM   Specimen: Nasopharyngeal Swab; Nasopharyngeal(NP) swabs in vial transport medium  Result Value Ref Range Status   SARS Coronavirus 2 by RT PCR NEGATIVE NEGATIVE Final    Comment: (NOTE) SARS-CoV-2 target nucleic acids are NOT DETECTED.  The SARS-CoV-2 RNA is generally detectable in upper respiratory specimens during the acute phase of infection. The lowest concentration of SARS-CoV-2 viral copies this assay can detect is 138 copies/mL. A negative result does not preclude SARS-Cov-2 infection and should not be used as the sole basis for treatment or other patient management decisions. A negative result may occur with  improper specimen  collection/handling, submission of specimen other than nasopharyngeal swab, presence of viral mutation(s) within the areas targeted by this assay, and inadequate number of viral copies(<138 copies/mL). A negative result must be combined with clinical observations, patient history, and epidemiological information. The expected result is Negative.  Fact Sheet for Patients:  EntrepreneurPulse.com.au  Fact Sheet for Healthcare Providers:  IncredibleEmployment.be  This test is no t yet approved or cleared by the [place] FDA and  has been authorized for detection and/or diagnosis of SARS-CoV-2 by FDA under an Emergency Use Authorization (EUA). This EUA will remain  in effect (meaning this test can be used) for the duration of the COVID-19 declaration under Section 564(b)(1) of the Act, 21 U.S.C.section 360bbb-3(b)(1), unless the authorization is terminated  or revoked sooner.       Influenza A by PCR NEGATIVE NEGATIVE Final   Influenza B by PCR NEGATIVE NEGATIVE Final    Comment: (NOTE) The Xpert Xpress SARS-CoV-2/FLU/RSV plus assay is intended as an aid in the diagnosis of influenza from Nasopharyngeal swab specimens and should not be used as a sole basis for treatment. Nasal washings and aspirates are unacceptable for Xpert Xpress SARS-CoV-2/FLU/RSV testing.  Fact Sheet for Patients: EntrepreneurPulse.com.au  Fact Sheet for Healthcare Providers: IncredibleEmployment.be  This test is not yet approved or cleared by the [place] FDA and has been authorized for detection and/or diagnosis of SARS-CoV-2 by FDA under an Emergency Use Authorization (EUA). This EUA will remain in effect (meaning this test can be used) for the duration of the COVID-19 declaration under Section 564(b)(1) of the Act, 21 U.S.C. section 360bbb-3(b)(1), unless the authorization is terminated or revoked.  Performed at [facility], [place]  [address], [place], [place] [address]     Radiology Reports CT HEAD WO CONTRAST  Result Date: 10/15/2020 CLINICAL DATA:  Head trauma, abnormal mental status (Age 18-64y) Progressive altered mental status. EXAM: CT HEAD WITHOUT CONTRAST TECHNIQUE: Contiguous axial images were obtained from the base of the skull through the vertex without intravenous contrast. COMPARISON:  Head CT yesterday, as well as 10/12/2020 FINDINGS: Brain: Despite repeat acquisition, there is motion artifact. No acute hemorrhage allowing for motion limitations. Stable degree of atrophy and chronic small vessel ischemia. Remote left posterior parietooccipital infarct. Stable remote lacunar infarcts in the basal ganglia versus prominent perivascular space. No evidence of  acute or developing ischemia. No subdural or extra-axial collection. No hydrocephalus. Vascular: No obvious hyperdense vessel, motion artifact limitations. Skull: No fracture or focal lesion. Sinuses/Orbits: Moderately motion obscured. No acute findings. Paranasal sinuses and mastoid air cells are clear. Other: None. IMPRESSION: 1. Motion limited exam. No acute intracranial abnormality. No skull fracture. 2. Stable atrophy, chronic small vessel ischemia, and remote infarcts. Electronically Signed   By: Keith Rake M.D.   On: 10/15/2020 15:20   MR ANGIO HEAD WO CONTRAST  Result Date: 10/16/2020 CLINICAL DATA:  Stroke follow-up EXAM: MRA HEAD WITHOUT CONTRAST TECHNIQUE: Angiographic images of the Circle of Willis were obtained using MRA technique without intravenous contrast. COMPARISON:  10/15/2020 brain MRI FINDINGS: POSTERIOR CIRCULATION: --Vertebral arteries: Normal --Inferior cerebellar arteries: Normal. --Basilar artery: Normal. --Superior cerebellar arteries: Normal. --Posterior cerebral arteries: Normal. ANTERIOR CIRCULATION: --Intracranial internal carotid arteries: Normal. --Anterior cerebral arteries (ACA): Multifocal signal loss,  likely artifactual. No proximal occlusion. --Middle cerebral arteries (MCA): Multifocal signal loss, likely artifactual. No proximal occlusion. ANATOMIC VARIANTS: Fetal origin of the right PCA. IMPRESSION: 1. No emergent large vessel occlusion. Electronically Signed   By: Ulyses Jarred M.D.   On: 10/16/2020 03:32   MR BRAIN WO CONTRAST  Result Date: 10/15/2020 CLINICAL DATA:  Left facial droop EXAM: MRI HEAD WITHOUT CONTRAST TECHNIQUE: Multiplanar, multiecho pulse sequences of the brain and surrounding structures were obtained without intravenous contrast. COMPARISON:  None. FINDINGS: Abbreviated protocol was used due to patient altered mental status, consisting of axial and coronal diffusion-weighted imaging. There is a small area of acute ischemia within the right corona radiata. Second focus of acute ischemia within the anteromedial right thalamus. There is generalized atrophy. Old left occipital lobe infarct. IMPRESSION: 1. Truncated examination due to patient altered mental status. 2. Small areas of acute ischemia within the right corona radiata and anteromedial right thalamus. Electronically Signed   By: Ulyses Jarred M.D.   On: 10/15/2020 18:58   DG Chest Portable 1 View  Result Date: 10/15/2020 CLINICAL DATA:  Cough, dementia, altered level of consciousness EXAM: PORTABLE CHEST 1 VIEW COMPARISON:  10/14/2020 FINDINGS: Single frontal view of the chest demonstrates mild enlargement the cardiac silhouette. Large hiatal hernia again noted. No acute airspace disease, effusion, or pneumothorax. Cardiac monitor within the left anterior chest wall. IMPRESSION: 1. No acute intrathoracic process. 2. Large hiatal hernia. Electronically Signed   By: Randa Ngo M.D.   On: 10/15/2020 17:30   ECHOCARDIOGRAM COMPLETE  Result Date: 10/16/2020    ECHOCARDIOGRAM REPORT   Patient Name:   Alyssa Odonnell Runde Date of Exam: 10/16/2020 Medical Rec #:  [id number]      Height:       64.0 in Accession #:    [id number]      Weight:       160.0 lb Date of Birth:  1934/02/22      BSA:          1.779 m Patient Age:    36 years       BP:           135/64 mmHg Patient Gender: F              HR:           88 bpm. Exam Location:  Inpatient Procedure: 2D Echo Indications:    Stroke I163.9  History:        Patient has prior history of Echocardiogram examinations, most  recent 10/01/2019. Risk Factors:Hypertension and Dyslipidemia.  Sonographer:    Thurman Coyer RDCS (AE) Referring Phys: [phone] JARED M GARDNER IMPRESSIONS  1. Left ventricular ejection fraction, by estimation, is 60 to 65%. The left ventricle has normal function. The left ventricle has no regional wall motion abnormalities. Left ventricular diastolic parameters are consistent with Grade I diastolic dysfunction (impaired relaxation).  2. Right ventricular systolic function is normal. The right ventricular size is normal. Tricuspid regurgitation signal is inadequate for assessing PA pressure.  3. The mitral valve is normal in structure. Mild mitral valve regurgitation. No evidence of mitral stenosis.  4. The aortic valve was not well visualized. Aortic valve regurgitation is not visualized. Mild to moderate aortic valve stenosis. Vmax 2.5 m/s, MG 12 mmHg, AVA 1.1 cm^2, DI 0.4  5. The inferior vena cava is normal in size with greater than 50% respiratory variability, suggesting right atrial pressure of 3 mmHg. FINDINGS  Left Ventricle: Left ventricular ejection fraction, by estimation, is 60 to 65%. The left ventricle has normal function. The left ventricle has no regional wall motion abnormalities. The left ventricular internal cavity size was normal in size. There is  no left ventricular hypertrophy. Left ventricular diastolic parameters are consistent with Grade I diastolic dysfunction (impaired relaxation). Right Ventricle: The right ventricular size is normal. No increase in right ventricular wall thickness. Right ventricular systolic function is normal.  Tricuspid regurgitation signal is inadequate for assessing PA pressure. Left Atrium: Left atrial size was normal in size. Right Atrium: Right atrial size was normal in size. Pericardium: Trivial pericardial effusion is present. Mitral Valve: The mitral valve is normal in structure. Mild mitral valve regurgitation. No evidence of mitral valve stenosis. Tricuspid Valve: The tricuspid valve is normal in structure. Tricuspid valve regurgitation is not demonstrated. Aortic Valve: The aortic valve was not well visualized. Aortic valve regurgitation is not visualized. Mild to moderate aortic stenosis is present. Aortic valve mean gradient measures 12.0 mmHg. Aortic valve peak gradient measures 22.6 mmHg. Aortic valve area, by VTI measures 1.18 cm. Pulmonic Valve: The pulmonic valve was not well visualized. Pulmonic valve regurgitation is not visualized. Aorta: The aortic root is normal in size and structure. Venous: The inferior vena cava is normal in size with greater than 50% respiratory variability, suggesting right atrial pressure of 3 mmHg. IAS/Shunts: The interatrial septum was not well visualized.  LEFT VENTRICLE PLAX 2D LVIDd:         4.10 cm  Diastology LVIDs:         2.70 cm  LV e' medial:    4.90 cm/s LV PW:         0.80 cm  LV E/e' medial:  14.0 LV IVS:        0.90 cm  LV e' lateral:   8.59 cm/s LVOT diam:     1.90 cm  LV E/e' lateral: 8.0 LV SV:         55 LV SV Index:   31 LVOT Area:     2.84 cm  RIGHT VENTRICLE TAPSE (M-mode): 1.3 cm LEFT ATRIUM             Index       RIGHT ATRIUM           Index LA diam:        2.80 cm 1.57 cm/m  RA Area:     11.70 cm LA Vol (A2C):   34.1 ml 19.16 ml/m RA Volume:   25.10 ml  14.11 ml/m LA  Vol (A4C):   39.7 ml 22.31 ml/m LA Biplane Vol: 38.0 ml 21.36 ml/m  AORTIC VALVE AV Area (Vmax):    1.27 cm AV Area (Vmean):   1.17 cm AV Area (VTI):     1.18 cm AV Vmax:           237.50 cm/s AV Vmean:          163.000 cm/s AV VTI:            0.469 m AV Peak Grad:      22.6  mmHg AV Mean Grad:      12.0 mmHg LVOT Vmax:         106.00 cm/s LVOT Vmean:        67.300 cm/s LVOT VTI:          0.195 m LVOT/AV VTI ratio: 0.42  AORTA Ao Root diam: 2.90 cm MITRAL VALVE MV Area (PHT): 2.91 cm    SHUNTS MV Decel Time: 261 msec    Systemic VTI:  0.20 m MV E velocity: 68.40 cm/s  Systemic Diam: 1.90 cm MV A velocity: 99.40 cm/s MV E/A ratio:  0.69 Oswaldo Milian MD Electronically signed by Oswaldo Milian MD Signature Date/Time: 10/16/2020/1:36:53 PM    Final    VAS US CAROTID (at [facility] and [facility] only)  Result Date: 10/17/2020 Carotid Arterial Duplex Study Indications:       CVA. Risk Factors:      Hypertension, hyperlipidemia, PAD. Comparison Study:  No prior studies. Performing Technologist: Darlin Coco, RDMS  Examination Guidelines: A complete evaluation includes B-mode imaging, spectral Doppler, color Doppler, and power Doppler as needed of all accessible portions of each vessel. Bilateral testing is considered an integral part of a complete examination. Limited examinations for reoccurring indications may be performed as noted.  Right Carotid Findings: +----------+--------+--------+--------+------------------+--------+           PSV cm/sEDV cm/sStenosisPlaque DescriptionComments +----------+--------+--------+--------+------------------+--------+ CCA Prox  97      18                                         +----------+--------+--------+--------+------------------+--------+ CCA Distal62      15                                         +----------+--------+--------+--------+------------------+--------+ ICA Prox  69      21      1-39%   calcific                   +----------+--------+--------+--------+------------------+--------+ ICA Distal70      23                                         +----------+--------+--------+--------+------------------+--------+ ECA       96      13                                          +----------+--------+--------+--------+------------------+--------+ +----------+--------+-------+----------------+-------------------+           PSV cm/sEDV cmsDescribe        Arm Pressure (mmHG) +----------+--------+-------+----------------+-------------------+ [id number]  Multiphasic, WNL                    +----------+--------+-------+----------------+-------------------+ +---------+--------+--+--------+-+---------+ VertebralPSV cm/s35EDV cm/s8Antegrade +---------+--------+--+--------+-+---------+  Left Carotid Findings: +----------+--------+--------+--------+----------------------+--------+           PSV cm/sEDV cm/sStenosisPlaque Description    Comments +----------+--------+--------+--------+----------------------+--------+ CCA Prox  119     15                                             +----------+--------+--------+--------+----------------------+--------+ CCA Distal62      15                                             +----------+--------+--------+--------+----------------------+--------+ ICA Prox  87      27      1-39%   calcific and irregular         +----------+--------+--------+--------+----------------------+--------+ ICA Distal72      21                                             +----------+--------+--------+--------+----------------------+--------+ ECA       134     20                                             +----------+--------+--------+--------+----------------------+--------+ +----------+--------+--------+----------------+-------------------+           PSV cm/sEDV cm/sDescribe        Arm Pressure (mmHG) +----------+--------+--------+----------------+-------------------+ [id number]             Multiphasic, WNL                    +----------+--------+--------+----------------+-------------------+ +---------+--------+--+--------+--+---------+ VertebralPSV cm/s80EDV cm/s18Antegrade  +---------+--------+--+--------+--+---------+   Summary: Right Carotid: Velocities in the right ICA are consistent with a 1-39% stenosis. Left Carotid: Velocities in the left ICA are consistent with a 1-39% stenosis. Vertebrals:  Bilateral vertebral arteries demonstrate antegrade flow. Subclavians: Normal flow hemodynamics were seen in bilateral subclavian              arteries. *See table(s) above for measurements and observations.  Electronically signed by Antony Contras MD on 10/17/2020 at 10:45:41 AM.    Final      Time Spent in minutes  30     Desiree Hane M.D on 10/20/2020 at 7:52 PM  To page go to [email or web address] - password [email or web address]

## 2020-10-20 NOTE — Plan of Care (Signed)
  Problem: Education: Goal: Knowledge of disease or condition will improve Outcome: Progressing Goal: Knowledge of secondary prevention will improve Outcome: Progressing   Problem: Coping: Goal: Will verbalize positive feelings about self Outcome: Progressing Goal: Will identify appropriate support needs Outcome: Progressing   Problem: Education: Goal: Knowledge of disease or condition will improve Outcome: Progressing Goal: Knowledge of secondary prevention will improve Outcome: Progressing   Problem: Coping: Goal: Will verbalize positive feelings about self Outcome: Progressing Goal: Will identify appropriate support needs Outcome: Progressing   Problem: Intracerebral Hemorrhage Tissue Perfusion: Goal: Complications of Intracerebral Hemorrhage will be minimized Outcome: Progressing   Problem: Ischemic Stroke/TIA Tissue Perfusion: Goal: Complications of ischemic stroke/TIA will be minimized Outcome: Progressing   Problem: Spontaneous Subarachnoid Hemorrhage Tissue Perfusion: Goal: Complications of Spontaneous Subarachnoid Hemorrhage will be minimized Outcome: Progressing   

## 2020-10-21 DIAGNOSIS — I10 Essential (primary) hypertension: Secondary | ICD-10-CM | POA: Diagnosis not present

## 2020-10-21 MED ORDER — TRAMADOL HCL 50 MG PO TABS
50.0000 mg | ORAL_TABLET | Freq: Four times a day (QID) | ORAL | Status: DC
  Administered 2020-10-21 – 2020-10-22 (×6): 50 mg via ORAL
  Administered 2020-10-23: 100 mg via ORAL
  Administered 2020-10-23: 50 mg via ORAL
  Filled 2020-10-21: qty 1
  Filled 2020-10-21: qty 2
  Filled 2020-10-21 (×6): qty 1

## 2020-10-21 MED ORDER — TRAMADOL HCL 50 MG PO TABS: 50.0000 mg | ORAL_TABLET | Freq: Four times a day (QID) | ORAL | Status: DC

## 2020-10-21 NOTE — TOC Progression Note (Signed)
Transition of Care [facility]) - Progression Note    Patient Details  Name: Alyssa Odonnell MRN: [id number] Date of Birth: 1933-11-17  [facility] CM/SW Contact  Terrial Rhodes, LCSWA Phone Number: 10/21/2020, 6:14 PM  Clinical Narrative:     CSW spoke with patients son and gave SNF bed offers. Patients son would like to look them over and will give CSW SNF choice first thing in the morning.   Pending SNF choice.  CSW will continue to follow.    Expected Discharge Plan: Skilled Nursing Facility Barriers to Discharge: Continued Medical Work up,SNF Pending bed offer  Expected Discharge Plan and Services Expected Discharge Plan: Skilled Nursing Facility     Post Acute Care Choice: Skilled Nursing Facility Living arrangements for the past 2 months: Assisted Living Facility ([facility])                                       Social Determinants of Health (SDOH) Interventions    Readmission Risk Interventions Readmission Risk Prevention Plan 10/02/2019  Post Dischage Appt Not Complete  Appt Comments plan for SNF  Medication Screening Complete  Transportation Screening Complete  Some recent data might be hidden

## 2020-10-21 NOTE — Plan of Care (Signed)
  Problem: Education: Goal: Knowledge of disease or condition will improve Outcome: Progressing Goal: Knowledge of secondary prevention will improve Outcome: Progressing   Problem: Coping: Goal: Will verbalize positive feelings about self Outcome: Progressing Goal: Will identify appropriate support needs Outcome: Progressing   Problem: Education: Goal: Knowledge of disease or condition will improve Outcome: Progressing Goal: Knowledge of secondary prevention will improve Outcome: Progressing   Problem: Coping: Goal: Will verbalize positive feelings about self Outcome: Progressing Goal: Will identify appropriate support needs Outcome: Progressing   Problem: Intracerebral Hemorrhage Tissue Perfusion: Goal: Complications of Intracerebral Hemorrhage will be minimized Outcome: Progressing   Problem: Ischemic Stroke/TIA Tissue Perfusion: Goal: Complications of ischemic stroke/TIA will be minimized Outcome: Progressing   Problem: Spontaneous Subarachnoid Hemorrhage Tissue Perfusion: Goal: Complications of Spontaneous Subarachnoid Hemorrhage will be minimized Outcome: Progressing   

## 2020-10-21 NOTE — Progress Notes (Signed)
Alyssa Odonnell, Alyssa DOA: 10/15/2020 PCP: Kerin Salen, Alyssa  Brief History   Alyssa Odonnell is a 87 y.o. female with medical history significant of prior stroke, baseline R sided weakness, HTN, PAF on eliquis, mild dementia, HLD.  Pt presents to ED for new L sided facial droop, hallucinations, AMS.  Per son: pt with several falls over last few days.  Fell on [place], seen at OSH, evaluated and discharged back to SNF.  Pt reports another fall yesterday.  Son reports pt woke up this AM and is not acting like her normal self.  Per son she gets confused but is never persistent like this.  This AM talking to dead parents in the room who wernt there.  This is new for her.  Pt reports that she does feel slightly confused and feels tired. She does report a dry cough but denies any chest pain or shortness of breath. She also says she is had some nausea and some diarrhea but denies vomiting. She denies any urinary changes. She denies any headache or neck pain but does report some low back pain that has resolved after her fall. She is unsure about the hallucinations and she does not think she has had a facial droop in the past.  Pt denies pain anywhere, hasnt noticed any blood in stool, denies N/V.  Work up in ED demonstrates UTI findings on UA and new acute ischemic strokes on MRI.  Also her INR > 10. HGB normal, no evidence of bleed anywhere at this point.  [facility] was consulted to admit the patient for further evaluation and care.  Neurology was consulted. The patient underwent a stroke work up. She is in a telemetry bed and being monitored in the SDU due to super elevated INR. Plavix held for this reason. Eliquis also held. INR down to 1.2 on the morning of 10/17/2020. As this result was repeated the following morning of 10/18/2020. Lab error on admission. MRI confirms small areas of acute ischemia within the right corona radiata  and anteromedial right thalamus. MRA demonstrates no emergent large vessel occlusion. Carotid ultrasound 1-39% stenosis of carotid arteries bilaterally. Antegrade flow in bilateral vertebral arteries . Normal flow hemodynamic in bilateral subclavian arteries. Echocardiogram has demonstrated EF of 60-65% with normal function of the left ventricle. Grade 1 diastolic dysfunction. Mild to moderate aortic valve stenosis. No intracardiac source of embolus. PT/OT to eval and treat. Neurology has recommended Zetia 10 mg daily and that the patient follow up with lipid clinic. He recommended restarting Eliquis.  She has been evaluated by PT/OT. They have recommended SNF placement. TOC has been consulted and the patient is awaiting placement.  Consultants  . Neurology  Procedures  . Odonnell  Antibiotics   Anti-infectives (From admission, onward)   Start     Dose/Rate Route Frequency Ordered Stop   10/15/20 2115  cefTRIAXone (ROCEPHIN) 1 g in sodium chloride 0.9 % 100 mL IVPB        1 g 200 mL/hr over 30 Minutes Intravenous Every 24 hours 10/15/20 2107 10/19/20 2254     Subjective  The patient is resting in bed. No new complaints.  Objective   Vitals:  Vitals:   10/21/20 0840 10/21/20 1404  BP: 129/67 (!) 149/85  Pulse: 67 81  Resp: 19 18  Temp: 97.6 F (36.4 C) 97.6 F (36.4 C)  SpO2: 99% 96%   Exam:  Constitutional:  The patient is awake and alert. No  acute distress. Respiratory:  . No increased work of breathing. . No wheezes, rales, or rhonchi . No tactile fremitus Cardiovascular:  . Regular rate and rhythm . No murmurs, ectopy, or gallups. . No lateral PMI. No thrills. Abdomen:  . Abdomen is soft, non-tender, non-distended . No hernias, masses, or organomegaly . Normoactive bowel sounds.  Musculoskeletal:  . No cyanosis, clubbing, or edema Skin:  . No rashes, lesions, ulcers . palpation of skin: no induration or nodules Neurologic:  . CN 2-12 intact . Sensation all 4  extremities intact Psychiatric:  . Mental status o Mood, affect appropriate o Orientation to person, place, time  . judgment and insight appear intact  I have personally reviewed the following:   Today's Data  . BMP,  CBC  Micro Data  . Urine culture with polymicrobial growth. Recollection is recommended.  Imaging  . MRA Head without contrast. . MRI brain . CT head  Cardiology Data  . EKG . Echocardiogram  Scheduled Meds: . apixaban  5 mg Oral BID  . cariprazine  3 mg Oral Daily  . carvedilol  3.125 mg Oral BID  . divalproex  125 mg Oral Daily   And  . divalproex  250 mg Oral QHS  . escitalopram  20 mg Oral Daily  . ezetimibe  10 mg Oral Daily  . latanoprost  1 drop Both Eyes QHS  . levothyroxine  100 mcg Oral QAC breakfast  . pantoprazole  40 mg Oral Daily   Continuous Infusions: . lactated ringers 125 mL/hr at 10/21/20 1517    Principal Problem:   Acute ischemic stroke [facility]) Active Problems:   Essential hypertension   PAF (paroxysmal atrial fibrillation) (HCC)   Dementia with behavioral disturbance (HCC)   Elevated INR   LOS: 6 days   A & P  Acute ischemic CVA: Neurology is consulted and the patient is on the stroke pathway. She is in a telemetry bed and being monitored in the SDU due to super elevated INR. Plavix held for this reason. Eliquis also held. INR down to 1.2 this morning. ? Lab error on admission. MRI confirms small areas of acute ischemia within the right corona radiata and anteromedial right thalamus. MRA demonstrates no emergent large vessel occlusion. Carotid ultrasound 1-39% stenosis of carotid arteries bilaterally. Antegrade flow in bilateral vertebral arteries . Normal flow hemodynamic in bilateral subclavian arteries. Echocardiogram has demonstrated EF of 60-65% with normal function of the left ventricle. Grade 1 diastolic dysfunction. Mild to moderate aortic valve stenosis. No intracardiac source of embolus. PT/OT to eval and treat. Neurology  has recommended Zetia 10 mg daily and that the patient follow up with lipid clinic. He recommended restarting Eliquis. PT/OT has evaluated the patient. She is awaiting placement.  Essential Hypertension: Blood pressures are normotensive on low dose Coreg only. Monitor.  PAF: The patient's rate is controlled on Coreg. Eliquis has been restarted.  Dementia with behavioral disturbance: Delirium due to dementia and sundowning. Continue Depakote.  Elevated INR: Suspect lab error. Repeated INR was 1.2.  I have seen and examined this patient myself. I have spent 32 minutes in her evaluation and care.  DVT Prophylaxis: Eliquis restarted CODE STATUS: Full Code Family Communication: Odonnell available Disposition:  Status is: Inpatient  Remains inpatient appropriate because:Inpatient level of care appropriate due to severity of illness   Dispo: The patient is from: Home              Anticipated d/c is to: TBD  Anticipated d/c date is: 1 day              Patient currently is not medically stable to d/c.  Tateanna Bach, DO [facility] Direct contact: see [email or web address]  7PM-7AM contact night coverage as above 10/21/2020, 3:34 PM  LOS: 2 days

## 2020-10-21 NOTE — Discharge Instructions (Signed)

## 2020-10-21 NOTE — Progress Notes (Signed)
Physical Therapy Treatment Patient Details Name: Alyssa Odonnell MRN: [id number] DOB: 03/18/1934 Today's Date: 10/21/2020    History of Present Illness Pt is an 87 year old female who presents for onset of left facial droop, hallucinations, several falls over the past few days, and AMS.  She was found to have UTI.  MRI of brain showed acute ischemia within Rt corona radiata and anteromedial Rt thalamus. No tPA administered. past medical history includes:   prior stroke with right-sided weakness at baseline, hypertension, paroxysmal atrial fibrillation Eliquis, hypothyroidism, hypercholesterolemia, dementia, osteoarthritis and depression    PT Comments    Patient progressing slowly towards PT goals. Improved ambulation distance with Min A for balance/safety and use of RW for support. Pt fatigues quickly reporting bil knee weakness and discomfort. Increased time to perform all transitions/mobility. Requires Mod A to stand from EOB with cues for technique. Follows commands well. Continue to recommend SNF to maximize independence and mobility. Will follow.    Follow Up Recommendations  SNF;Supervision/Assistance - 24 hour     Equipment Recommendations  None recommended by PT    Recommendations for Other Services       Precautions / Restrictions Precautions Precautions: Fall Restrictions Weight Bearing Restrictions: No    Mobility  Bed Mobility Overal bed mobility: Needs Assistance Bed Mobility: Supine to Sit     Supine to sit: Mod assist;HOB elevated     General bed mobility comments: Assist with trunk and LEs to get to EOB, increased time and cues for technique/using rail.  Transfers Overall transfer level: Needs assistance Equipment used: Rolling walker (2 wheeled) Transfers: Sit to/from Stand Sit to Stand: Mod assist         General transfer comment: Assist to power to standing with cues for hand placement/technique, slow to rise with posterior bias initially. Flexed  trunk. Transferred to chair post ambulation.  Ambulation/Gait Ambulation/Gait assistance: Min assist Gait Distance (Feet): 50 Feet Assistive device: Rolling walker (2 wheeled) Gait Pattern/deviations: Step-through pattern;Trunk flexed;Decreased step length - right;Decreased step length - left Gait velocity: decreased   General Gait Details: Slow, shortened step lengths with flexed trunk; cues for RW proximity. Fatigues quickly reporting weakness in bil knees.   Stairs             Wheelchair Mobility    Modified Rankin (Stroke Patients Only) Modified Rankin (Stroke Patients Only) Pre-Morbid Rankin Score: Moderately severe disability Modified Rankin: Moderately severe disability     Balance Overall balance assessment: Needs assistance Sitting-balance support: Feet supported;Single extremity supported Sitting balance-Leahy Scale: Poor Sitting balance - Comments: supervision for safety.   Standing balance support: During functional activity Standing balance-Leahy Scale: Poor Standing balance comment: reliant on BUE support of RW                            Cognition Arousal/Alertness: Awake/alert Behavior During Therapy: WFL for tasks assessed/performed Overall Cognitive Status: History of cognitive impairments - at baseline                                 General Comments: Knows she is in the hospital and had a stroke. Needs repetition at times. Follows commands well, slow processing.      Exercises      General Comments        Pertinent Vitals/Pain Pain Assessment: Faces Faces Pain Scale: Hurts little more Pain Location: feet Pain  Descriptors / Indicators: Sore;Tightness Pain Intervention(s): Monitored during session;Repositioned    Home Living                      Prior Function            PT Goals (current goals can now be found in the care plan section) Progress towards PT goals: Progressing toward goals     Frequency    Min 3X/week      PT Plan Current plan remains appropriate    Co-evaluation              AM-PAC PT "6 Clicks" Mobility   Outcome Measure  Help needed turning from your back to your side while in a flat bed without using bedrails?: A Little Help needed moving from lying on your back to sitting on the side of a flat bed without using bedrails?: A Lot Help needed moving to and from a bed to a chair (including a wheelchair)?: A Little Help needed standing up from a chair using your arms (e.g., wheelchair or bedside chair)?: A Lot Help needed to walk in hospital room?: A Little Help needed climbing 3-5 steps with a railing? : A Lot 6 Click Score: 15    End of Session Equipment Utilized During Treatment: Gait belt Activity Tolerance: Patient tolerated treatment well;Patient limited by fatigue Patient left: in chair;with call bell/phone within reach;with chair alarm set;with nursing/sitter in room Nurse Communication: Mobility status PT Visit Diagnosis: Unsteadiness on feet (R26.81);Other abnormalities of gait and mobility (R26.89);Muscle weakness (generalized) (M62.81);Difficulty in walking, not elsewhere classified (R26.2);Other symptoms and signs involving the nervous system (R29.898);Hemiplegia and hemiparesis;Pain Hemiplegia - Right/Left:  (bil) Hemiplegia - dominant/non-dominant:  (bil) Hemiplegia - caused by: Cerebral infarction Pain - Right/Left:  (bil) Pain - part of body: Ankle and joints of foot     Time: 1340-1400 PT Time Calculation (min) (ACUTE ONLY): 20 min  Charges:  $Gait Training: 8-22 mins                     Marisa Severin, PT, DPT Acute Rehabilitation Services Pager [phone] Office [place] 10/21/2020, 2:16 PM

## 2020-10-22 DIAGNOSIS — I10 Essential (primary) hypertension: Secondary | ICD-10-CM | POA: Diagnosis not present

## 2020-10-22 LAB — BASIC METABOLIC PANEL
Anion gap: 9 (ref 5–15)
BUN: 6 mg/dL — ABNORMAL LOW (ref 8–23)
CO2: 26 mmol/L (ref 22–32)
Calcium: 9 mg/dL (ref 8.9–10.3)
Chloride: 107 mmol/L (ref 98–111)
Creatinine, Ser: 1.08 mg/dL — ABNORMAL HIGH (ref 0.44–1.00)
GFR, Estimated: 51 mL/min — ABNORMAL LOW (ref >60)
Glucose, Bld: 94 mg/dL (ref 70–99)
Potassium: 3.7 mmol/L (ref 3.5–5.1)
Sodium: 142 mmol/L (ref 135–145)

## 2020-10-22 NOTE — Progress Notes (Signed)
PROGRESS NOTE  Alyssa Odonnell MWN:[id number] DOB: [birth year] DOA: 10/15/2020 PCP: Kerin Salen, PA-C  Brief History   Alyssa Odonnell is a 87 y.o. female with medical history significant of prior stroke, baseline R sided weakness, HTN, PAF on eliquis, mild dementia, HLD.  Pt presents to ED for new L sided facial droop, hallucinations, AMS.  Per son: pt with several falls over last few days.  Fell on [place], seen at OSH, evaluated and discharged back to SNF.  Pt reports another fall yesterday.  Son reports pt woke up this AM and is not acting like her normal self.  Per son she gets confused but is never persistent like this.  This AM talking to dead parents in the room who wernt there.  This is new for her.  Pt reports that she does feel slightly confused and feels tired. She does report a dry cough but denies any chest pain or shortness of breath. She also says she is had some nausea and some diarrhea but denies vomiting. She denies any urinary changes. She denies any headache or neck pain but does report some low back pain that has resolved after her fall. She is unsure about the hallucinations and she does not think she has had a facial droop in the past.  Pt denies pain anywhere, hasnt noticed any blood in stool, denies N/V.  Work up in ED demonstrates UTI findings on UA and new acute ischemic strokes on MRI.  Also her INR > 10. HGB normal, no evidence of bleed anywhere at this point.  [facility] was consulted to admit the patient for further evaluation and care.  Neurology was consulted. The patient underwent a stroke work up. She is in a telemetry bed and being monitored in the SDU due to super elevated INR. Plavix held for this reason. Eliquis also held. INR down to 1.2 on the morning of 10/17/2020. As this result was repeated the following morning of 10/18/2020. Lab error on admission. MRI confirms small areas of acute ischemia within the right corona radiata  and anteromedial right thalamus. MRA demonstrates no emergent large vessel occlusion. Carotid ultrasound 1-39% stenosis of carotid arteries bilaterally. Antegrade flow in bilateral vertebral arteries . Normal flow hemodynamic in bilateral subclavian arteries. Echocardiogram has demonstrated EF of 60-65% with normal function of the left ventricle. Grade 1 diastolic dysfunction. Mild to moderate aortic valve stenosis. No intracardiac source of embolus. PT/OT to eval and treat. Neurology has recommended Zetia 10 mg daily and that the patient follow up with lipid clinic. He recommended restarting Eliquis.  She has been evaluated by PT/OT. They have recommended SNF placement. TOC has been consulted and the patient is awaiting placement.  Consultants  . Neurology  Procedures  . None  Antibiotics   Anti-infectives (From admission, onward)   Start     Dose/Rate Route Frequency Ordered Stop   10/15/20 2115  cefTRIAXone (ROCEPHIN) 1 g in sodium chloride 0.9 % 100 mL IVPB        1 g 200 mL/hr over 30 Minutes Intravenous Every 24 hours 10/15/20 2107 10/19/20 2254     Subjective  The patient is resting in bed. No new complaints.  Objective   Vitals:  Vitals:   10/22/20 1345 10/22/20 1714  BP: 128/67 (!) 126/99  Pulse: 72 80  Resp: 16 17  Temp: (!) 97.4 F (36.3 C) (!) 97.5 F (36.4 C)  SpO2: 95% 97%   Exam:  Constitutional:  The patient is awake and  alert. No acute distress. Respiratory:  . No increased work of breathing. . No wheezes, rales, or rhonchi . No tactile fremitus Cardiovascular:  . Regular rate and rhythm . No murmurs, ectopy, or gallups. . No lateral PMI. No thrills. Abdomen:  . Abdomen is soft, non-tender, non-distended . No hernias, masses, or organomegaly . Normoactive bowel sounds.  Musculoskeletal:  . No cyanosis, clubbing, or edema Skin:  . No rashes, lesions, ulcers . palpation of skin: no induration or nodules Neurologic:  . CN 2-12 intact . Sensation  all 4 extremities intact Psychiatric:  . Mental status o Mood, affect appropriate o Orientation to person, place, time  . judgment and insight appear intact  I have personally reviewed the following:   Today's Data  . BMP,  CBC  Micro Data  . Urine culture with polymicrobial growth. Recollection is recommended.  Imaging  . MRA Head without contrast. . MRI brain . CT head  Cardiology Data  . EKG . Echocardiogram  Scheduled Meds: . apixaban  5 mg Oral BID  . cariprazine  3 mg Oral Daily  . carvedilol  3.125 mg Oral BID  . divalproex  125 mg Oral Daily   And  . divalproex  250 mg Oral QHS  . escitalopram  20 mg Oral Daily  . ezetimibe  10 mg Oral Daily  . latanoprost  1 drop Both Eyes QHS  . levothyroxine  100 mcg Oral QAC breakfast  . pantoprazole  40 mg Oral Daily  . traMADol  50-100 mg Oral Q6H   Continuous Infusions: . lactated ringers 125 mL/hr at 10/22/20 0340    Principal Problem:   Acute ischemic stroke [facility]) Active Problems:   Essential hypertension   PAF (paroxysmal atrial fibrillation) (HCC)   Dementia with behavioral disturbance (HCC)   Elevated INR   LOS: 7 days   A & P  Acute ischemic CVA: Neurology is consulted and the patient is on the stroke pathway. She is in a telemetry bed and being monitored in the SDU due to super elevated INR. Plavix held for this reason. Eliquis also held. INR down to 1.2 this morning. ? Lab error on admission. MRI confirms small areas of acute ischemia within the right corona radiata and anteromedial right thalamus. MRA demonstrates no emergent large vessel occlusion. Carotid ultrasound 1-39% stenosis of carotid arteries bilaterally. Antegrade flow in bilateral vertebral arteries . Normal flow hemodynamic in bilateral subclavian arteries. Echocardiogram has demonstrated EF of 60-65% with normal function of the left ventricle. Grade 1 diastolic dysfunction. Mild to moderate aortic valve stenosis. No intracardiac source of  embolus. PT/OT to eval and treat. Neurology has recommended Zetia 10 mg daily and that the patient follow up with lipid clinic. He recommended restarting Eliquis. PT/OT has evaluated the patient. She is awaiting placement.  Essential Hypertension: Blood pressures are normotensive on low dose Coreg only. Monitor.  PAF: The patient's rate is controlled on Coreg. Eliquis has been restarted.  Dementia with behavioral disturbance: Delirium due to dementia and sundowning. Continue Depakote.  Elevated INR: Suspect lab error. Repeated INR was 1.2.  I have seen and examined this patient myself. I have spent 32 minutes in her evaluation and care.  DVT Prophylaxis: Eliquis restarted CODE STATUS: Full Code Family Communication: None available Disposition:  Status is: Inpatient  Remains inpatient appropriate because:Inpatient level of care appropriate due to severity of illness   Dispo: The patient is from: Home  Anticipated d/c is to: TBD              Anticipated d/c date is: 1 day              Patient currently is not medically stable to d/c.  Alyssa Thoma, DO [facility] Direct contact: see [email or web address]  7PM-7AM contact night coverage as above 10/22/2020, 5:41 PM  LOS: 2 days

## 2020-10-22 NOTE — TOC Progression Note (Addendum)
Transition of Care [facility]) - Progression Note    Patient Details  Name: Alyssa Odonnell MRN: [id number] Date of Birth: Mar 14, 1934  Transition of Care [facility]) CM/SW Contact  Terrial Rhodes, LCSWA Phone Number: 10/22/2020, 10:46 AM  Clinical Narrative:     Update 1/4- Insurance authorization has been approved for patient. Authorization ID# [id number] and [facility] Reference number [id number]. Approved from 1/4 through 1/6. Next review date is on 1/6.  Patient has SNF bed at [facility]. Insurance authorization has been approved.  CSW will continue to follow.  CSW received call from patients son Casimiro Needle who has chosen SNF placement at [facility]. CSW awaiting callback from Lsha to confirmed SNF placement. CSW called patient insurance to start insurance authorization for patient. Fish farm manager # [id number]. Clinicals were faxed over to patients insurance for review.  CSW will continue to follow.    Expected Discharge Plan: Skilled Nursing Facility Barriers to Discharge: Continued Medical Work up,SNF Pending bed offer  Expected Discharge Plan and Services Expected Discharge Plan: Skilled Nursing Facility     Post Acute Care Choice: Skilled Nursing Facility Living arrangements for the past 2 months: Assisted Living Facility ([facility] )                                       Social Determinants of Health (SDOH) Interventions    Readmission Risk Interventions Readmission Risk Prevention Plan 10/02/2019  Post Dischage Appt Not Complete  Appt Comments plan for SNF  Medication Screening Complete  Transportation Screening Complete  Some recent data might be hidden

## 2020-10-23 DIAGNOSIS — I10 Essential (primary) hypertension: Secondary | ICD-10-CM | POA: Diagnosis not present

## 2020-10-23 MED ORDER — SENNOSIDES-DOCUSATE SODIUM 8.6-50 MG PO TABS: 1.0000 | ORAL_TABLET | Freq: Every evening | ORAL | 0 refills | Status: AC | PRN

## 2020-10-23 MED ORDER — EZETIMIBE 10 MG PO TABS: 10.0000 mg | ORAL_TABLET | Freq: Every day | ORAL | 0 refills | Status: AC

## 2020-10-23 MED ORDER — LORAZEPAM 0.5 MG PO TABS: 0.5000 mg | ORAL_TABLET | Freq: Two times a day (BID) | ORAL | 0 refills | Status: DC | PRN

## 2020-10-23 MED ORDER — TRAMADOL HCL 50 MG PO TABS: 50.0000 mg | ORAL_TABLET | Freq: Four times a day (QID) | ORAL | 0 refills | Status: AC | PRN

## 2020-10-23 NOTE — Progress Notes (Signed)
Physical Therapy Treatment Patient Details Name: Alyssa Odonnell MRN: [id number] DOB: 1934/10/11 Today's Date: 10/23/2020    History of Present Illness Pt is an 87 year old female who presents for onset of left facial droop, hallucinations, several falls over the past few days, and AMS.  She was found to have UTI.  MRI of brain showed acute ischemia within Rt corona radiata and anteromedial Rt thalamus. No tPA administered. past medical history includes:   prior stroke with right-sided weakness at baseline, hypertension, paroxysmal atrial fibrillation Eliquis, hypothyroidism, hypercholesterolemia, dementia, osteoarthritis and depression    PT Comments    Pt continues to demonstrate a posterior bias when initially coming to stand from EOB, resulting in her requiring modA to steady self and power up to stand. In addition, pt displays impaired balance and requires verbal and tactile cues along with minA to shift her weight to her R to allow for increased step length on the L, poor carryover noted. Pt limited in ambulation distance progression this date by fatigue. Will continue to follow acutely. Current recommendations remain appropriate.  Follow Up Recommendations  SNF;Supervision/Assistance - 24 hour     Equipment Recommendations  None recommended by PT    Recommendations for Other Services       Precautions / Restrictions Precautions Precautions: Fall Restrictions Weight Bearing Restrictions: No    Mobility  Bed Mobility Overal bed mobility: Needs Assistance Bed Mobility: Supine to Sit     Supine to sit: HOB elevated;Mod assist     General bed mobility comments: Cues and extra time provided to manage legs off EOB and to ascend trunk. ModA at trunk to complete and square hips with EOB.  Transfers Overall transfer level: Needs assistance Equipment used: Rolling walker (2 wheeled) Transfers: Stand Pivot Transfers;Sit to/from Stand Sit to Stand: Mod assist Stand pivot  transfers: Min assist       General transfer comment: Extra time and cues for hand placement to come to stand from EOB 1x. Pt demonstrates posterior bias resulting in modA to power up to stand and steady pt. Several side steps to transfer to R to bedside chair with minA and utilization of RW.  Ambulation/Gait Ambulation/Gait assistance: Min assist Gait Distance (Feet): 25 Feet Assistive device: Rolling walker (2 wheeled) Gait Pattern/deviations: Step-to pattern;Decreased step length - left;Decreased stride length;Decreased weight shift to right;Shuffle;Trunk flexed Gait velocity: decreased Gait velocity interpretation: <1.31 ft/sec, indicative of household ambulator General Gait Details: Ambulates with trunk flexed, taking small shuffling steps bilat with decreased step length on L compared to R. Cues and assistance provided to shift weight to R and inc step length on L, with min success. LOB 2x with minA to recover.   Stairs             Wheelchair Mobility    Modified Rankin (Stroke Patients Only) Modified Rankin (Stroke Patients Only) Pre-Morbid Rankin Score: Moderately severe disability Modified Rankin: Moderately severe disability     Balance Overall balance assessment: Needs assistance Sitting-balance support: Bilateral upper extremity supported;Feet supported Sitting balance-Leahy Scale: Poor Sitting balance - Comments: Reliant on UE support and min guard for safety.   Standing balance support: Bilateral upper extremity supported;During functional activity Standing balance-Leahy Scale: Poor Standing balance comment: Reliant on UE support and minA for balance.                            Cognition Arousal/Alertness: Awake/alert Behavior During Therapy: WFL for tasks assessed/performed Overall  Cognitive Status: History of cognitive impairments - at baseline                                 General Comments: Extra time and repeated cues  provided to maintain safety and sequence tasks.      Exercises      General Comments        Pertinent Vitals/Pain Pain Assessment: Faces Faces Pain Scale: Hurts a little bit Pain Location: generalized with movement Pain Descriptors / Indicators: Discomfort;Grimacing Pain Intervention(s): Limited activity within patient's tolerance;Monitored during session;Repositioned    Home Living                      Prior Function            PT Goals (current goals can now be found in the care plan section) Acute Rehab PT Goals Patient Stated Goal: to improve PT Goal Formulation: With patient/family Time For Goal Achievement: 10/31/20 Potential to Achieve Goals: Good Progress towards PT goals: Progressing toward goals    Frequency    Min 3X/week      PT Plan Current plan remains appropriate    Co-evaluation              AM-PAC PT "6 Clicks" Mobility   Outcome Measure  Help needed turning from your back to your side while in a flat bed without using bedrails?: A Lot Help needed moving from lying on your back to sitting on the side of a flat bed without using bedrails?: A Lot Help needed moving to and from a bed to a chair (including a wheelchair)?: A Little Help needed standing up from a chair using your arms (e.g., wheelchair or bedside chair)?: A Lot Help needed to walk in hospital room?: A Little Help needed climbing 3-5 steps with a railing? : A Lot 6 Click Score: 14    End of Session Equipment Utilized During Treatment: Gait belt Activity Tolerance: Patient limited by fatigue Patient left: in chair;with call bell/phone within reach;with chair alarm set Nurse Communication: Mobility status PT Visit Diagnosis: Unsteadiness on feet (R26.81);Other abnormalities of gait and mobility (R26.89);Muscle weakness (generalized) (M62.81);Difficulty in walking, not elsewhere classified (R26.2);Other symptoms and signs involving the nervous system  (R29.898);Hemiplegia and hemiparesis Hemiplegia - Right/Left:  (bilat) Hemiplegia - dominant/non-dominant:  (bilat) Hemiplegia - caused by: Cerebral infarction     Time: 1113-1130 PT Time Calculation (min) (ACUTE ONLY): 17 min  Charges:  $Gait Training: 8-22 mins                     Moishe Spice, PT, DPT Acute Rehabilitation Services  Pager: [phone] Office: [place] 10/23/2020, 12:25 PM

## 2020-10-23 NOTE — Discharge Summary (Addendum)
Physician Discharge Summary  Alyssa Odonnell [id number] DOB: Nov 19, 1933 DOA: 10/15/2020  PCP: Jenel Lucks, PA-C  Admit date: 10/15/2020 Discharge date: 10/23/2020  Recommendations for Outpatient Follow-up:  Discharge to SNF for PT/OT/Rehab Follow up with PCP in 7-10 days after discharge from rehab. CBC and BMP to be drawn on that date and reported to PCP. Follow up with neurology at [facility] for stroke clinic in 3 weeks.   Contact information for follow-up providers     Ward Givens, NP. Go on 11/18/2020.   Specialty: Neurology Contact information: [place]  [phone]              Contact information for after-discharge care     Destination     [name] .   Service: Skilled Nursing Contact information: [address] [place] [phone]                    Discharge Diagnoses: Principal diagnosis is #1 Acute ischemic CVA   Essential hypertension Paroxysmal Atrial Fibrillation Encephalopathy in the setting of Dementia with behavioral disturbance Elevated INR - lab error UTI  Discharge Condition: Fair  Disposition: SNF  Diet recommendation: Heart healthy  Filed Weights   10/20/20 1200  Weight: 72.6 kg    History of present illness: Alyssa Odonnell is a 87 y.o. female with medical history significant of prior stroke, baseline R sided weakness, HTN, PAF on eliquis, mild dementia, HLD.   Pt presents to ED for new L sided facial droop, hallucinations, AMS.  Per son: pt with several falls over last few days.  Fell on [place], seen at OSH, evaluated and discharged back to SNF.  Pt reports another fall yesterday.   Son reports pt woke up this AM and is not acting like her normal self.  Per son she gets confused but is never persistent like this.  This AM talking to dead parents in the room who wernt there.  This is new for her.   Pt reports that she  does feel slightly confused and feels tired.  She does report a dry cough but denies any chest pain or shortness of breath.  She also says she is had some nausea and some diarrhea but denies vomiting.  She denies any urinary changes.  She denies any headache or neck pain but does report some low back pain that has resolved after her fall.  She is unsure about the hallucinations and she does not think she has had a facial droop in the past.   Pt denies pain anywhere, hasnt noticed any blood in stool, denies N/V.     ED Course: Work up in ED demonstrates UTI findings on UA and new acute ischemic strokes on MRI.  Also her INR > 10!   HGB normal, no evidence of bleed anywhere at this point.  Hospital Course: [facility] was consulted to admit the patient for further evaluation and care.   Neurology was consulted. The patient underwent a stroke work up. She is in a telemetry bed and being monitored in the SDU due to super elevated INR. Plavix held for this reason. Eliquis also held. INR down to 1.2 on the morning of 10/17/2020. As this result was repeated the following morning of 10/18/2020. Lab error on admission. MRI confirms small areas of acute ischemia within the right corona radiata and anteromedial right thalamus. MRA demonstrates no emergent large vessel occlusion. Carotid ultrasound 1-39% stenosis  of carotid arteries bilaterally. Antegrade flow in bilateral vertebral arteries . Normal flow hemodynamic in bilateral subclavian arteries. Echocardiogram has demonstrated EF of 60-65% with normal function of the left ventricle. Grade 1 diastolic dysfunction. Mild to moderate aortic valve stenosis. No intracardiac source of embolus. PT/OT to eval and treat. Neurology has recommended Zetia 10 mg daily and that the patient follow up with lipid clinic. He recommended restarting Eliquis and continuing plavix as previous to admission.   She has been evaluated by PT/OT. They have recommended SNF placement.  TOC has been consulted and the patient is awaiting placement.  Today's assessment: S: [name]. No new complaints. O: Vitals:  Vitals:   10/23/20 0300 10/23/20 0757  BP: 122/69 131/76  Pulse: 64 73  Resp: 18 20  Temp: (!) 97.4 F (36.3 C) 97.7 F (36.5 C)  SpO2: 100% 98%   Exam:   Constitutional:  The patient is awake and alert. No acute distress. Respiratory:  No increased work of breathing. No wheezes, rales, or rhonchi No tactile fremitus Cardiovascular:  Regular rate and rhythm No murmurs, ectopy, or gallups. No lateral PMI. No thrills. Abdomen:  Abdomen is soft, non-tender, non-distended No hernias, masses, or organomegaly Normoactive bowel sounds.  Musculoskeletal:  No cyanosis, clubbing, or edema Skin:  No rashes, lesions, ulcers palpation of skin: no induration or nodules Neurologic:  CN 2-12 intact Sensation all 4 extremities intact Psychiatric:  Mental status Mood, affect appropriate Orientation to person, place, time  judgment and insight appear intact   I have personally reviewed the following:    Discharge Instructions  Discharge Instructions     Activity as tolerated - No restrictions   Complete by: As directed    Call MD for:   Complete by: As directed    Neurological changes   Call MD for:  difficulty breathing, headache or visual disturbances   Complete by: As directed    Call MD for:  extreme fatigue   Complete by: As directed    Call MD for:  persistant dizziness or light-headedness   Complete by: As directed    Diet - low sodium heart healthy   Complete by: As directed    Discharge instructions   Complete by: As directed    Discharge to SNF for PT/OT/Rehab Follow up with PCP in 7-10 days after discharge from rehab. CBC and BMP to be drawn on that date and reported to PCP. Follow up with neurology at [facility] for stroke clinic in 3 weeks.   Increase activity slowly   Complete by: As directed        Allergies as of 10/23/2020       Reactions   Aspirin Other (See Comments)   Red splotches   Atorvastatin Other (See Comments)   Muscle spams   Nabumetone Itching   Nsaids Other (See Comments)   Unknown reaction   Pravastatin Other (See Comments)   Stiff,  walking difficulty   Procaine Swelling   Rosuvastatin Nausea Only, Other (See Comments)   Reflux and Muscle stiffness   Simvastatin Nausea Only, Other (See Comments)   Reflux and Muscle stiffness   Statins Other (See Comments)   Difficulty walking, muscle spasms, reflux, muscle stiffness   Evolocumab Rash   Mobic [meloxicam] Rash   Penicillins Rash   Has patient had a PCN reaction causing immediate rash, facial/tongue/throat swelling, SOB or lightheadedness with hypotension: Yes Has patient had a PCN reaction causing severe rash involving mucus membranes or skin necrosis:  No Has patient had a PCN reaction that required hospitalization: No Has patient had a PCN reaction occurring within the last 10 years: No If all of the above answers are "NO", then may proceed with Cephalosporin use.        Medication List     STOP taking these medications    amiodarone 200 MG tablet Commonly known as: PACERONE   nitrofurantoin (macrocrystal-monohydrate) 100 MG capsule Commonly known as: MACROBID       TAKE these medications    acetaminophen 325 MG tablet Commonly known as: TYLENOL Take 650 mg by mouth every 6 (six) hours as needed (pain).   amLODipine 10 MG tablet Commonly known as: NORVASC Take 10 mg by mouth daily.   carvedilol 3.125 MG tablet Commonly known as: COREG Take 3.125 mg by mouth 2 (two) times daily.   CERTA-VITE PO Take 1 tablet by mouth daily.   clopidogrel 75 MG tablet Commonly known as: PLAVIX Take 75 mg by mouth daily.   divalproex 125 MG capsule Commonly known as: DEPAKOTE SPRINKLE Take 125-250 mg by mouth See admin instructions. 1 capsule in the morning and 2 capsules at bedtime.   Eliquis  5 MG Tabs tablet Generic drug: apixaban TAKE 1 TABLET BY MOUTH TWO  TIMES DAILY What changed: how much to take   escitalopram 20 MG tablet Commonly known as: LEXAPRO Take 20 mg by mouth daily.   ezetimibe 10 MG tablet Commonly known as: ZETIA Take 1 tablet (10 mg total) by mouth daily. Start taking on: October 24, 2020   latanoprost 0.005 % ophthalmic solution Commonly known as: XALATAN Place 1 drop into both eyes at bedtime.   levothyroxine 100 MCG tablet Commonly known as: SYNTHROID Take 100 mcg by mouth daily before breakfast.   LORazepam 0.5 MG tablet Commonly known as: ATIVAN Take 1 tablet (0.5 mg total) by mouth 2 (two) times daily as needed for anxiety. What changed:  when to take this reasons to take this   pantoprazole 40 MG tablet Commonly known as: PROTONIX Take 40 mg by mouth daily.   senna-docusate 8.6-50 MG tablet Commonly known as: Senokot-S Take 1 tablet by mouth at bedtime as needed for mild constipation.   traMADol 50 MG tablet Commonly known as: ULTRAM Take 1-2 tablets (50-100 mg total) by mouth every 6 (six) hours as needed for moderate pain or severe pain. What changed:  how much to take when to take this reasons to take this   Vraylar capsule Generic drug: cariprazine Take 3 mg by mouth daily.       Allergies  Allergen Reactions   Aspirin Other (See Comments)    Red splotches   Atorvastatin Other (See Comments)    Muscle spams   Nabumetone Itching   Nsaids Other (See Comments)    Unknown reaction   Pravastatin Other (See Comments)    Stiff,  walking difficulty    Procaine Swelling   Rosuvastatin Nausea Only and Other (See Comments)    Reflux and Muscle stiffness   Simvastatin Nausea Only and Other (See Comments)    Reflux and Muscle stiffness   Statins Other (See Comments)    Difficulty walking, muscle spasms, reflux, muscle stiffness   Evolocumab Rash   Mobic [Meloxicam] Rash   Penicillins Rash    Has patient had a PCN  reaction causing immediate rash, facial/tongue/throat swelling, SOB or lightheadedness with hypotension: Yes Has patient had a PCN reaction causing severe rash involving mucus membranes or skin necrosis: No Has  patient had a PCN reaction that required hospitalization: No Has patient had a PCN reaction occurring within the last 10 years: No If all of the above answers are "NO", then may proceed with Cephalosporin use.      The results of significant diagnostics from this hospitalization (including imaging, microbiology, ancillary and laboratory) are listed below for reference.    Significant Diagnostic Studies: CT HEAD WO CONTRAST  Result Date: 10/15/2020 CLINICAL DATA:  Head trauma, abnormal mental status (Age 66-64y) Progressive altered mental status. EXAM: CT HEAD WITHOUT CONTRAST TECHNIQUE: Contiguous axial images were obtained from the base of the skull through the vertex without intravenous contrast. COMPARISON:  Head CT yesterday, as well as 10/12/2020 FINDINGS: Brain: Despite repeat acquisition, there is motion artifact. No acute hemorrhage allowing for motion limitations. Stable degree of atrophy and chronic small vessel ischemia. Remote left posterior parietooccipital infarct. Stable remote lacunar infarcts in the basal ganglia versus prominent perivascular space. No evidence of acute or developing ischemia. No subdural or extra-axial collection. No hydrocephalus. Vascular: No obvious hyperdense vessel, motion artifact limitations. Skull: No fracture or focal lesion. Sinuses/Orbits: Moderately motion obscured. No acute findings. Paranasal sinuses and mastoid air cells are clear. Other: None. IMPRESSION: 1. Motion limited exam. No acute intracranial abnormality. No skull fracture. 2. Stable atrophy, chronic small vessel ischemia, and remote infarcts. Electronically Signed   By: Keith Rake M.D.   On: 10/15/2020 15:20   MR ANGIO HEAD WO CONTRAST  Result Date: 10/16/2020 CLINICAL DATA:   Stroke follow-up EXAM: MRA HEAD WITHOUT CONTRAST TECHNIQUE: Angiographic images of the Circle of Willis were obtained using MRA technique without intravenous contrast. COMPARISON:  10/15/2020 brain MRI FINDINGS: POSTERIOR CIRCULATION: --Vertebral arteries: Normal --Inferior cerebellar arteries: Normal. --Basilar artery: Normal. --Superior cerebellar arteries: Normal. --Posterior cerebral arteries: Normal. ANTERIOR CIRCULATION: --Intracranial internal carotid arteries: Normal. --Anterior cerebral arteries (ACA): Multifocal signal loss, likely artifactual. No proximal occlusion. --Middle cerebral arteries (MCA): Multifocal signal loss, likely artifactual. No proximal occlusion. ANATOMIC VARIANTS: Fetal origin of the right PCA. IMPRESSION: 1. No emergent large vessel occlusion. Electronically Signed   By: Ulyses Jarred M.D.   On: 10/16/2020 03:32   MR BRAIN WO CONTRAST  Result Date: 10/15/2020 CLINICAL DATA:  Left facial droop EXAM: MRI HEAD WITHOUT CONTRAST TECHNIQUE: Multiplanar, multiecho pulse sequences of the brain and surrounding structures were obtained without intravenous contrast. COMPARISON:  None. FINDINGS: Abbreviated protocol was used due to patient altered mental status, consisting of axial and coronal diffusion-weighted imaging. There is a small area of acute ischemia within the right corona radiata. Second focus of acute ischemia within the anteromedial right thalamus. There is generalized atrophy. Old left occipital lobe infarct. IMPRESSION: 1. Truncated examination due to patient altered mental status. 2. Small areas of acute ischemia within the right corona radiata and anteromedial right thalamus. Electronically Signed   By: Ulyses Jarred M.D.   On: 10/15/2020 18:58   DG Chest Portable 1 View  Result Date: 10/15/2020 CLINICAL DATA:  Cough, dementia, altered level of consciousness EXAM: PORTABLE CHEST 1 VIEW COMPARISON:  10/14/2020 FINDINGS: Single frontal view of the chest demonstrates  mild enlargement the cardiac silhouette. Large hiatal hernia again noted. No acute airspace disease, effusion, or pneumothorax. Cardiac monitor within the left anterior chest wall. IMPRESSION: 1. No acute intrathoracic process. 2. Large hiatal hernia. Electronically Signed   By: Randa Ngo M.D.   On: 10/15/2020 17:30   ECHOCARDIOGRAM COMPLETE  Result Date: 10/16/2020    ECHOCARDIOGRAM REPORT   Patient Name:  Kemari H Muckleroy Date of Exam: 10/16/2020 Medical Rec #:  [id number]      Height:       64.0 in Accession #:    [id number]     Weight:       160.0 lb Date of Birth:  [birth year]      BSA:          1.779 m Patient Age:    67 years       BP:           135/64 mmHg Patient Gender: F              HR:           88 bpm. Exam Location:  Inpatient Procedure: 2D Echo Indications:    Stroke I163.9  History:        Patient has prior history of Echocardiogram examinations, most                 recent 10/01/2019. Risk Factors:Hypertension and Dyslipidemia.  Sonographer:    Mikki Santee RDCS (AE) Referring Phys: [place]  1. Left ventricular ejection fraction, by estimation, is 60 to 65%. The left ventricle has normal function. The left ventricle has no regional wall motion abnormalities. Left ventricular diastolic parameters are consistent with Grade I diastolic dysfunction (impaired relaxation).  2. Right ventricular systolic function is normal. The right ventricular size is normal. Tricuspid regurgitation signal is inadequate for assessing PA pressure.  3. The mitral valve is normal in structure. Mild mitral valve regurgitation. No evidence of mitral stenosis.  4. The aortic valve was not well visualized. Aortic valve regurgitation is not visualized. Mild to moderate aortic valve stenosis. Vmax 2.5 m/s, MG 12 mmHg, [place] 1.1 cm^2, DI 0.4  5. The inferior vena cava is normal in size with greater than 50% respiratory variability, suggesting right atrial pressure of 3 mmHg. FINDINGS  Left  Ventricle: Left ventricular ejection fraction, by estimation, is 60 to 65%. The left ventricle has normal function. The left ventricle has no regional wall motion abnormalities. The left ventricular internal cavity size was normal in size. There is  no left ventricular hypertrophy. Left ventricular diastolic parameters are consistent with Grade I diastolic dysfunction (impaired relaxation). Right Ventricle: The right ventricular size is normal. No increase in right ventricular wall thickness. Right ventricular systolic function is normal. Tricuspid regurgitation signal is inadequate for assessing PA pressure. Left Atrium: Left atrial size was normal in size. Right Atrium: Right atrial size was normal in size. Pericardium: Trivial pericardial effusion is present. Mitral Valve: The mitral valve is normal in structure. Mild mitral valve regurgitation. No evidence of mitral valve stenosis. Tricuspid Valve: The tricuspid valve is normal in structure. Tricuspid valve regurgitation is not demonstrated. Aortic Valve: The aortic valve was not well visualized. Aortic valve regurgitation is not visualized. Mild to moderate aortic stenosis is present. Aortic valve mean gradient measures 12.0 mmHg. Aortic valve peak gradient measures 22.6 mmHg. Aortic valve area, by VTI measures 1.18 cm. Pulmonic Valve: The pulmonic valve was not well visualized. Pulmonic valve regurgitation is not visualized. Aorta: The aortic root is normal in size and structure. Venous: The inferior vena cava is normal in size with greater than 50% respiratory variability, suggesting right atrial pressure of 3 mmHg. IAS/Shunts: The interatrial septum was not well visualized.  LEFT VENTRICLE PLAX 2D LVIDd:         4.10 cm  Diastology LVIDs:  2.70 cm  LV e' medial:    4.90 cm/s LV PW:         0.80 cm  LV E/e' medial:  14.0 LV IVS:        0.90 cm  LV e' lateral:   8.59 cm/s LVOT diam:     1.90 cm  LV E/e' lateral: 8.0 LV SV:         55 LV SV Index:   31  LVOT Area:     2.84 cm  RIGHT VENTRICLE TAPSE (M-mode): 1.3 cm LEFT ATRIUM             Index       RIGHT ATRIUM           Index LA diam:        2.80 cm 1.57 cm/m  RA Area:     11.70 cm LA Vol (A2C):   34.1 ml 19.16 ml/m RA Volume:   25.10 ml  14.11 ml/m LA Vol (A4C):   39.7 ml 22.31 ml/m LA Biplane Vol: 38.0 ml 21.36 ml/m  AORTIC VALVE AV Area (Vmax):    1.27 cm AV Area (Vmean):   1.17 cm AV Area (VTI):     1.18 cm AV Vmax:           237.50 cm/s AV Vmean:          163.000 cm/s AV VTI:            0.469 m AV Peak Grad:      22.6 mmHg AV Mean Grad:      12.0 mmHg LVOT Vmax:         106.00 cm/s LVOT Vmean:        67.300 cm/s LVOT VTI:          0.195 m LVOT/AV VTI ratio: 0.42  AORTA Ao Root diam: 2.90 cm MITRAL VALVE MV Area (PHT): 2.91 cm    SHUNTS MV Decel Time: 261 msec    Systemic VTI:  0.20 m MV E velocity: 68.40 cm/s  Systemic Diam: 1.90 cm MV A velocity: 99.40 cm/s MV E/A ratio:  0.69 Oswaldo Milian MD Electronically signed by Oswaldo Milian MD Signature Date/Time: 10/16/2020/1:36:53 PM    Final    VAS US CAROTID (at [facility] and [facility] only)  Result Date: 10/17/2020 Carotid Arterial Duplex Study Indications:       CVA. Risk Factors:      Hypertension, hyperlipidemia, PAD. Comparison Study:  No prior studies. Performing Technologist: Darlin Coco, RDMS  Examination Guidelines: A complete evaluation includes B-mode imaging, spectral Doppler, color Doppler, and power Doppler as needed of all accessible portions of each vessel. Bilateral testing is considered an integral part of a complete examination. Limited examinations for reoccurring indications may be performed as noted.  Right Carotid Findings: +----------+--------+--------+--------+------------------+--------+           PSV cm/sEDV cm/sStenosisPlaque DescriptionComments +----------+--------+--------+--------+------------------+--------+ CCA Prox  97      18                                          +----------+--------+--------+--------+------------------+--------+ CCA Distal62      15                                         +----------+--------+--------+--------+------------------+--------+ ICA Prox  69  21      1-39%   calcific                   +----------+--------+--------+--------+------------------+--------+ ICA Distal70      23                                         +----------+--------+--------+--------+------------------+--------+ ECA       96      13                                         +----------+--------+--------+--------+------------------+--------+ +----------+--------+-------+----------------+-------------------+           PSV cm/sEDV cmsDescribe        Arm Pressure (mmHG) +----------+--------+-------+----------------+-------------------+ Subclavian113            Multiphasic, WNL                    +----------+--------+-------+----------------+-------------------+ +---------+--------+--+--------+-+---------+ VertebralPSV cm/s35EDV cm/s8Antegrade +---------+--------+--+--------+-+---------+  Left Carotid Findings: +----------+--------+--------+--------+----------------------+--------+           PSV cm/sEDV cm/sStenosisPlaque Description    Comments +----------+--------+--------+--------+----------------------+--------+ CCA Prox  119     15                                             +----------+--------+--------+--------+----------------------+--------+ CCA Distal62      15                                             +----------+--------+--------+--------+----------------------+--------+ ICA Prox  87      27      1-39%   calcific and irregular         +----------+--------+--------+--------+----------------------+--------+ ICA Distal72      21                                             +----------+--------+--------+--------+----------------------+--------+ ECA       134     20                                              +----------+--------+--------+--------+----------------------+--------+ +----------+--------+--------+----------------+-------------------+           PSV cm/sEDV cm/sDescribe        Arm Pressure (mmHG) +----------+--------+--------+----------------+-------------------+ [id number]             Multiphasic, WNL                    +----------+--------+--------+----------------+-------------------+ +---------+--------+--+--------+--+---------+ VertebralPSV cm/s80EDV cm/s18Antegrade +---------+--------+--+--------+--+---------+   Summary: Right Carotid: Velocities in the right ICA are consistent with a 1-39% stenosis. Left Carotid: Velocities in the left ICA are consistent with a 1-39% stenosis. Vertebrals:  Bilateral vertebral arteries demonstrate antegrade flow. Subclavians: Normal flow hemodynamics were seen in bilateral subclavian              arteries. *See table(s) above for measurements  and observations.  Electronically signed by Antony Contras MD on 10/17/2020 at 10:45:41 AM.    Final     Microbiology: Recent Results (from the past 240 hour(s))  Urine culture     Status: Abnormal   Collection Time: 10/15/20  5:46 PM   Specimen: Urine, Random  Result Value Ref Range Status   Specimen Description URINE, RANDOM  Final   Special Requests   Final    NONE Performed at [facility], [address], [place], [place] [address]    Culture MULTIPLE SPECIES PRESENT, SUGGEST RECOLLECTION (A)  Final   Report Status 10/17/2020 FINAL  Final  Resp Panel by RT-PCR (Flu A&B, Covid) Nasopharyngeal Swab     Status: None   Collection Time: 10/15/20  5:46 PM   Specimen: Nasopharyngeal Swab; Nasopharyngeal(NP) swabs in vial transport medium  Result Value Ref Range Status   SARS Coronavirus 2 by RT PCR NEGATIVE NEGATIVE Final    Comment: (NOTE) SARS-CoV-2 target nucleic acids are NOT DETECTED.  The SARS-CoV-2 RNA is generally detectable in upper respiratory specimens  during the acute phase of infection. The lowest concentration of SARS-CoV-2 viral copies this assay can detect is 138 copies/mL. A negative result does not preclude SARS-Cov-2 infection and should not be used as the sole basis for treatment or other patient management decisions. A negative result may occur with  improper specimen collection/handling, submission of specimen other than nasopharyngeal swab, presence of viral mutation(s) within the areas targeted by this assay, and inadequate number of viral copies(<138 copies/mL). A negative result must be combined with clinical observations, patient history, and epidemiological information. The expected result is Negative.  Fact Sheet for Patients:  EntrepreneurPulse.com.au  Fact Sheet for Healthcare Providers:  IncredibleEmployment.be  This test is no t yet approved or cleared by the [place] FDA and  has been authorized for detection and/or diagnosis of SARS-CoV-2 by FDA under an Emergency Use Authorization (EUA). This EUA will remain  in effect (meaning this test can be used) for the duration of the COVID-19 declaration under Section 564(b)(1) of the Act, 21 U.S.C.section 360bbb-3(b)(1), unless the authorization is terminated  or revoked sooner.       Influenza A by PCR NEGATIVE NEGATIVE Final   Influenza B by PCR NEGATIVE NEGATIVE Final    Comment: (NOTE) The Xpert Xpress SARS-CoV-2/FLU/RSV plus assay is intended as an aid in the diagnosis of influenza from Nasopharyngeal swab specimens and should not be used as a sole basis for treatment. Nasal washings and aspirates are unacceptable for Xpert Xpress SARS-CoV-2/FLU/RSV testing.  Fact Sheet for Patients: EntrepreneurPulse.com.au  Fact Sheet for Healthcare Providers: IncredibleEmployment.be  This test is not yet approved or cleared by the [place] FDA and has been authorized for detection  and/or diagnosis of SARS-CoV-2 by FDA under an Emergency Use Authorization (EUA). This EUA will remain in effect (meaning this test can be used) for the duration of the COVID-19 declaration under Section 564(b)(1) of the Act, 21 U.S.C. section 360bbb-3(b)(1), unless the authorization is terminated or revoked.  Performed at [facility], [place] [address], [place], [place] [address]      Labs: Basic Metabolic Panel: Recent Labs  Lab 10/18/20 1014 10/20/20 0304 10/22/20 0608  NA 141 142 142  K 3.5 3.3* 3.7  CL 103 108 107  CO2 28 22 26   GLUCOSE 110* 91 94  BUN 8 6* 6*  CREATININE 1.13* 1.06* 1.08*  CALCIUM 8.8* 9.0 9.0   Liver Function Tests:  No results for input(s): AST, ALT, ALKPHOS, BILITOT, PROT, ALBUMIN in the last 168 hours. No results for input(s): LIPASE, AMYLASE in the last 168 hours. No results for input(s): AMMONIA in the last 168 hours. CBC: Recent Labs  Lab 10/18/20 1014 10/20/20 0304  WBC 6.5 5.6  NEUTROABS  --  2.7  HGB 12.1 9.6*  HCT 34.8* 29.5*  MCV 88.5 91.6  PLT 177 126*   Cardiac Enzymes: No results for input(s): CKTOTAL, CKMB, CKMBINDEX, TROPONINI in the last 168 hours. BNP: BNP (last 3 results) No results for input(s): BNP in the last 8760 hours.  ProBNP (last 3 results) No results for input(s): PROBNP in the last 8760 hours.  CBG: No results for input(s): GLUCAP in the last 168 hours.  Principal Problem:   Acute ischemic stroke [facility]) Active Problems:   Essential hypertension   PAF (paroxysmal atrial fibrillation) (HCC)   Dementia with behavioral disturbance (HCC)   Elevated INR   Time coordinating discharge: 38 minutes.  Signed:        Minor Iden, DO [facility]  10/23/2020, 10:55 AM

## 2020-10-23 NOTE — Care Management Important Message (Signed)
Important Message  Patient Details  Name: Alyssa Odonnell MRN: [id number] Date of Birth: 08-17-1934   Medicare Important Message Given:  Yes     Mardene Sayer 10/23/2020, 3:03 PM

## 2020-10-23 NOTE — TOC Transition Note (Signed)
Transition of Care [facility]) - CM/SW Discharge Note   Patient Details  Name: Alyssa Odonnell MRN: [id number] Date of Birth: September 24, 1934  Transition of Care [facility]) CM/SW Contact:  Baldemar Lenis, LCSW Phone Number: 10/23/2020, 1:39 PM   Clinical Narrative:   Nurse to call report to [phone], Rm 110[facility]    Final next level of care: Skilled Nursing Facility Barriers to Discharge: Barriers Resolved   Patient Goals and CMS Choice Patient states their goals for this hospitalization and ongoing recovery are:: "be able to walk again" CMS Medicare.gov Compare Post Acute Care list provided to:: Patient Choice offered to / list presented to : Patient  Discharge Placement              Patient chooses bed at: [facility] Patient to be transferred to facility by: PTAR Name of family member notified: Kathlene November Patient and family notified of of transfer: 10/23/20  Discharge Plan and Services     Post Acute Care Choice: Skilled Nursing Facility                               Social Determinants of Health (SDOH) Interventions     Readmission Risk Interventions Readmission Risk Prevention Plan 10/02/2019  Post Dischage Appt Not Complete  Appt Comments plan for SNF  Medication Screening Complete  Transportation Screening Complete  Some recent data might be hidden

## 2020-11-18 ENCOUNTER — Ambulatory Visit: Payer: Medicare Other | Admitting: Adult Health

## 2020-11-19 DIAGNOSIS — M1990 Unspecified osteoarthritis, unspecified site: Secondary | ICD-10-CM | POA: Diagnosis not present

## 2020-11-19 DIAGNOSIS — R2681 Unsteadiness on feet: Secondary | ICD-10-CM | POA: Diagnosis not present

## 2020-11-19 DIAGNOSIS — I69351 Hemiplegia and hemiparesis following cerebral infarction affecting right dominant side: Secondary | ICD-10-CM | POA: Diagnosis not present

## 2020-11-19 DIAGNOSIS — M48061 Spinal stenosis, lumbar region without neurogenic claudication: Secondary | ICD-10-CM | POA: Diagnosis not present

## 2020-11-19 DIAGNOSIS — I69354 Hemiplegia and hemiparesis following cerebral infarction affecting left non-dominant side: Secondary | ICD-10-CM | POA: Diagnosis not present

## 2020-11-19 DIAGNOSIS — K219 Gastro-esophageal reflux disease without esophagitis: Secondary | ICD-10-CM | POA: Diagnosis not present

## 2020-11-19 DIAGNOSIS — R278 Other lack of coordination: Secondary | ICD-10-CM | POA: Diagnosis not present

## 2020-11-19 DIAGNOSIS — G8191 Hemiplegia, unspecified affecting right dominant side: Secondary | ICD-10-CM | POA: Diagnosis not present

## 2020-11-19 DIAGNOSIS — E782 Mixed hyperlipidemia: Secondary | ICD-10-CM | POA: Diagnosis not present

## 2020-11-19 DIAGNOSIS — I10 Essential (primary) hypertension: Secondary | ICD-10-CM | POA: Diagnosis not present

## 2020-11-19 DIAGNOSIS — I639 Cerebral infarction, unspecified: Secondary | ICD-10-CM | POA: Diagnosis not present

## 2020-11-19 DIAGNOSIS — M6281 Muscle weakness (generalized): Secondary | ICD-10-CM | POA: Diagnosis not present

## 2020-11-19 DIAGNOSIS — I63432 Cerebral infarction due to embolism of left posterior cerebral artery: Secondary | ICD-10-CM | POA: Diagnosis not present

## 2020-11-19 DIAGNOSIS — R488 Other symbolic dysfunctions: Secondary | ICD-10-CM | POA: Diagnosis not present

## 2020-11-19 DIAGNOSIS — I739 Peripheral vascular disease, unspecified: Secondary | ICD-10-CM | POA: Diagnosis not present

## 2020-11-19 DIAGNOSIS — E039 Hypothyroidism, unspecified: Secondary | ICD-10-CM | POA: Diagnosis not present

## 2020-11-20 DIAGNOSIS — R2681 Unsteadiness on feet: Secondary | ICD-10-CM | POA: Diagnosis not present

## 2020-11-20 DIAGNOSIS — R488 Other symbolic dysfunctions: Secondary | ICD-10-CM | POA: Diagnosis not present

## 2020-11-20 DIAGNOSIS — R278 Other lack of coordination: Secondary | ICD-10-CM | POA: Diagnosis not present

## 2020-11-20 DIAGNOSIS — M6281 Muscle weakness (generalized): Secondary | ICD-10-CM | POA: Diagnosis not present

## 2020-11-20 DIAGNOSIS — M48061 Spinal stenosis, lumbar region without neurogenic claudication: Secondary | ICD-10-CM | POA: Diagnosis not present

## 2020-11-20 DIAGNOSIS — I69351 Hemiplegia and hemiparesis following cerebral infarction affecting right dominant side: Secondary | ICD-10-CM | POA: Diagnosis not present

## 2020-11-20 DIAGNOSIS — I739 Peripheral vascular disease, unspecified: Secondary | ICD-10-CM | POA: Diagnosis not present

## 2020-11-20 DIAGNOSIS — I639 Cerebral infarction, unspecified: Secondary | ICD-10-CM | POA: Diagnosis not present

## 2020-11-20 DIAGNOSIS — I63432 Cerebral infarction due to embolism of left posterior cerebral artery: Secondary | ICD-10-CM | POA: Diagnosis not present

## 2020-11-21 DIAGNOSIS — I639 Cerebral infarction, unspecified: Secondary | ICD-10-CM | POA: Diagnosis not present

## 2020-11-21 DIAGNOSIS — I739 Peripheral vascular disease, unspecified: Secondary | ICD-10-CM | POA: Diagnosis not present

## 2020-11-21 DIAGNOSIS — R278 Other lack of coordination: Secondary | ICD-10-CM | POA: Diagnosis not present

## 2020-11-21 DIAGNOSIS — R2681 Unsteadiness on feet: Secondary | ICD-10-CM | POA: Diagnosis not present

## 2020-11-21 DIAGNOSIS — R488 Other symbolic dysfunctions: Secondary | ICD-10-CM | POA: Diagnosis not present

## 2020-11-21 DIAGNOSIS — M48061 Spinal stenosis, lumbar region without neurogenic claudication: Secondary | ICD-10-CM | POA: Diagnosis not present

## 2020-11-21 DIAGNOSIS — I63432 Cerebral infarction due to embolism of left posterior cerebral artery: Secondary | ICD-10-CM | POA: Diagnosis not present

## 2020-11-21 DIAGNOSIS — I69351 Hemiplegia and hemiparesis following cerebral infarction affecting right dominant side: Secondary | ICD-10-CM | POA: Diagnosis not present

## 2020-11-21 DIAGNOSIS — M6281 Muscle weakness (generalized): Secondary | ICD-10-CM | POA: Diagnosis not present

## 2020-11-22 DIAGNOSIS — M6281 Muscle weakness (generalized): Secondary | ICD-10-CM | POA: Diagnosis not present

## 2020-11-22 DIAGNOSIS — I639 Cerebral infarction, unspecified: Secondary | ICD-10-CM | POA: Diagnosis not present

## 2020-11-22 DIAGNOSIS — M48061 Spinal stenosis, lumbar region without neurogenic claudication: Secondary | ICD-10-CM | POA: Diagnosis not present

## 2020-11-22 DIAGNOSIS — R2681 Unsteadiness on feet: Secondary | ICD-10-CM | POA: Diagnosis not present

## 2020-11-22 DIAGNOSIS — I739 Peripheral vascular disease, unspecified: Secondary | ICD-10-CM | POA: Diagnosis not present

## 2020-11-22 DIAGNOSIS — I63432 Cerebral infarction due to embolism of left posterior cerebral artery: Secondary | ICD-10-CM | POA: Diagnosis not present

## 2020-11-22 DIAGNOSIS — R488 Other symbolic dysfunctions: Secondary | ICD-10-CM | POA: Diagnosis not present

## 2020-11-22 DIAGNOSIS — R278 Other lack of coordination: Secondary | ICD-10-CM | POA: Diagnosis not present

## 2020-11-22 DIAGNOSIS — I69351 Hemiplegia and hemiparesis following cerebral infarction affecting right dominant side: Secondary | ICD-10-CM | POA: Diagnosis not present

## 2020-11-23 DIAGNOSIS — I639 Cerebral infarction, unspecified: Secondary | ICD-10-CM | POA: Diagnosis not present

## 2020-11-23 DIAGNOSIS — I63432 Cerebral infarction due to embolism of left posterior cerebral artery: Secondary | ICD-10-CM | POA: Diagnosis not present

## 2020-11-23 DIAGNOSIS — I739 Peripheral vascular disease, unspecified: Secondary | ICD-10-CM | POA: Diagnosis not present

## 2020-11-23 DIAGNOSIS — R2681 Unsteadiness on feet: Secondary | ICD-10-CM | POA: Diagnosis not present

## 2020-11-23 DIAGNOSIS — M6281 Muscle weakness (generalized): Secondary | ICD-10-CM | POA: Diagnosis not present

## 2020-11-23 DIAGNOSIS — I69351 Hemiplegia and hemiparesis following cerebral infarction affecting right dominant side: Secondary | ICD-10-CM | POA: Diagnosis not present

## 2020-11-23 DIAGNOSIS — R278 Other lack of coordination: Secondary | ICD-10-CM | POA: Diagnosis not present

## 2020-11-23 DIAGNOSIS — M48061 Spinal stenosis, lumbar region without neurogenic claudication: Secondary | ICD-10-CM | POA: Diagnosis not present

## 2020-11-23 DIAGNOSIS — R488 Other symbolic dysfunctions: Secondary | ICD-10-CM | POA: Diagnosis not present

## 2020-11-24 DIAGNOSIS — M6281 Muscle weakness (generalized): Secondary | ICD-10-CM | POA: Diagnosis not present

## 2020-11-24 DIAGNOSIS — R488 Other symbolic dysfunctions: Secondary | ICD-10-CM | POA: Diagnosis not present

## 2020-11-24 DIAGNOSIS — M48061 Spinal stenosis, lumbar region without neurogenic claudication: Secondary | ICD-10-CM | POA: Diagnosis not present

## 2020-11-24 DIAGNOSIS — R2681 Unsteadiness on feet: Secondary | ICD-10-CM | POA: Diagnosis not present

## 2020-11-24 DIAGNOSIS — R278 Other lack of coordination: Secondary | ICD-10-CM | POA: Diagnosis not present

## 2020-11-24 DIAGNOSIS — I69351 Hemiplegia and hemiparesis following cerebral infarction affecting right dominant side: Secondary | ICD-10-CM | POA: Diagnosis not present

## 2020-11-24 DIAGNOSIS — I63432 Cerebral infarction due to embolism of left posterior cerebral artery: Secondary | ICD-10-CM | POA: Diagnosis not present

## 2020-11-24 DIAGNOSIS — I639 Cerebral infarction, unspecified: Secondary | ICD-10-CM | POA: Diagnosis not present

## 2020-11-24 DIAGNOSIS — I739 Peripheral vascular disease, unspecified: Secondary | ICD-10-CM | POA: Diagnosis not present

## 2020-11-25 DIAGNOSIS — I639 Cerebral infarction, unspecified: Secondary | ICD-10-CM | POA: Diagnosis not present

## 2020-11-25 DIAGNOSIS — M6281 Muscle weakness (generalized): Secondary | ICD-10-CM | POA: Diagnosis not present

## 2020-11-25 DIAGNOSIS — R278 Other lack of coordination: Secondary | ICD-10-CM | POA: Diagnosis not present

## 2020-11-25 DIAGNOSIS — I63432 Cerebral infarction due to embolism of left posterior cerebral artery: Secondary | ICD-10-CM | POA: Diagnosis not present

## 2020-11-25 DIAGNOSIS — I739 Peripheral vascular disease, unspecified: Secondary | ICD-10-CM | POA: Diagnosis not present

## 2020-11-25 DIAGNOSIS — R488 Other symbolic dysfunctions: Secondary | ICD-10-CM | POA: Diagnosis not present

## 2020-11-25 DIAGNOSIS — R2681 Unsteadiness on feet: Secondary | ICD-10-CM | POA: Diagnosis not present

## 2020-11-25 DIAGNOSIS — I69351 Hemiplegia and hemiparesis following cerebral infarction affecting right dominant side: Secondary | ICD-10-CM | POA: Diagnosis not present

## 2020-11-25 DIAGNOSIS — M48061 Spinal stenosis, lumbar region without neurogenic claudication: Secondary | ICD-10-CM | POA: Diagnosis not present

## 2020-11-26 DIAGNOSIS — M6281 Muscle weakness (generalized): Secondary | ICD-10-CM | POA: Diagnosis not present

## 2020-11-26 DIAGNOSIS — R2681 Unsteadiness on feet: Secondary | ICD-10-CM | POA: Diagnosis not present

## 2020-11-26 DIAGNOSIS — I739 Peripheral vascular disease, unspecified: Secondary | ICD-10-CM | POA: Diagnosis not present

## 2020-11-26 DIAGNOSIS — M48061 Spinal stenosis, lumbar region without neurogenic claudication: Secondary | ICD-10-CM | POA: Diagnosis not present

## 2020-11-26 DIAGNOSIS — I69351 Hemiplegia and hemiparesis following cerebral infarction affecting right dominant side: Secondary | ICD-10-CM | POA: Diagnosis not present

## 2020-11-26 DIAGNOSIS — R278 Other lack of coordination: Secondary | ICD-10-CM | POA: Diagnosis not present

## 2020-11-26 DIAGNOSIS — I63432 Cerebral infarction due to embolism of left posterior cerebral artery: Secondary | ICD-10-CM | POA: Diagnosis not present

## 2020-11-26 DIAGNOSIS — R488 Other symbolic dysfunctions: Secondary | ICD-10-CM | POA: Diagnosis not present

## 2020-11-26 DIAGNOSIS — I639 Cerebral infarction, unspecified: Secondary | ICD-10-CM | POA: Diagnosis not present

## 2020-11-27 DIAGNOSIS — I69351 Hemiplegia and hemiparesis following cerebral infarction affecting right dominant side: Secondary | ICD-10-CM | POA: Diagnosis not present

## 2020-11-27 DIAGNOSIS — I639 Cerebral infarction, unspecified: Secondary | ICD-10-CM | POA: Diagnosis not present

## 2020-11-27 DIAGNOSIS — I63432 Cerebral infarction due to embolism of left posterior cerebral artery: Secondary | ICD-10-CM | POA: Diagnosis not present

## 2020-11-27 DIAGNOSIS — I739 Peripheral vascular disease, unspecified: Secondary | ICD-10-CM | POA: Diagnosis not present

## 2020-11-27 DIAGNOSIS — R278 Other lack of coordination: Secondary | ICD-10-CM | POA: Diagnosis not present

## 2020-11-27 DIAGNOSIS — M6281 Muscle weakness (generalized): Secondary | ICD-10-CM | POA: Diagnosis not present

## 2020-11-27 DIAGNOSIS — G8191 Hemiplegia, unspecified affecting right dominant side: Secondary | ICD-10-CM | POA: Diagnosis not present

## 2020-11-27 DIAGNOSIS — R488 Other symbolic dysfunctions: Secondary | ICD-10-CM | POA: Diagnosis not present

## 2020-11-27 DIAGNOSIS — R2681 Unsteadiness on feet: Secondary | ICD-10-CM | POA: Diagnosis not present

## 2020-11-27 DIAGNOSIS — M48061 Spinal stenosis, lumbar region without neurogenic claudication: Secondary | ICD-10-CM | POA: Diagnosis not present

## 2020-11-28 DIAGNOSIS — R2681 Unsteadiness on feet: Secondary | ICD-10-CM | POA: Diagnosis not present

## 2020-11-28 DIAGNOSIS — I63432 Cerebral infarction due to embolism of left posterior cerebral artery: Secondary | ICD-10-CM | POA: Diagnosis not present

## 2020-11-28 DIAGNOSIS — I739 Peripheral vascular disease, unspecified: Secondary | ICD-10-CM | POA: Diagnosis not present

## 2020-11-28 DIAGNOSIS — M6281 Muscle weakness (generalized): Secondary | ICD-10-CM | POA: Diagnosis not present

## 2020-11-28 DIAGNOSIS — R278 Other lack of coordination: Secondary | ICD-10-CM | POA: Diagnosis not present

## 2020-11-28 DIAGNOSIS — R488 Other symbolic dysfunctions: Secondary | ICD-10-CM | POA: Diagnosis not present

## 2020-11-28 DIAGNOSIS — I69351 Hemiplegia and hemiparesis following cerebral infarction affecting right dominant side: Secondary | ICD-10-CM | POA: Diagnosis not present

## 2020-11-28 DIAGNOSIS — M48061 Spinal stenosis, lumbar region without neurogenic claudication: Secondary | ICD-10-CM | POA: Diagnosis not present

## 2020-11-28 DIAGNOSIS — I639 Cerebral infarction, unspecified: Secondary | ICD-10-CM | POA: Diagnosis not present

## 2020-11-29 DIAGNOSIS — I63432 Cerebral infarction due to embolism of left posterior cerebral artery: Secondary | ICD-10-CM | POA: Diagnosis not present

## 2020-11-29 DIAGNOSIS — I639 Cerebral infarction, unspecified: Secondary | ICD-10-CM | POA: Diagnosis not present

## 2020-11-29 DIAGNOSIS — M48061 Spinal stenosis, lumbar region without neurogenic claudication: Secondary | ICD-10-CM | POA: Diagnosis not present

## 2020-11-29 DIAGNOSIS — R488 Other symbolic dysfunctions: Secondary | ICD-10-CM | POA: Diagnosis not present

## 2020-11-29 DIAGNOSIS — I69351 Hemiplegia and hemiparesis following cerebral infarction affecting right dominant side: Secondary | ICD-10-CM | POA: Diagnosis not present

## 2020-11-29 DIAGNOSIS — R278 Other lack of coordination: Secondary | ICD-10-CM | POA: Diagnosis not present

## 2020-11-29 DIAGNOSIS — I739 Peripheral vascular disease, unspecified: Secondary | ICD-10-CM | POA: Diagnosis not present

## 2020-11-29 DIAGNOSIS — M6281 Muscle weakness (generalized): Secondary | ICD-10-CM | POA: Diagnosis not present

## 2020-11-29 DIAGNOSIS — R2681 Unsteadiness on feet: Secondary | ICD-10-CM | POA: Diagnosis not present

## 2020-12-01 DIAGNOSIS — M6281 Muscle weakness (generalized): Secondary | ICD-10-CM | POA: Diagnosis not present

## 2020-12-01 DIAGNOSIS — I739 Peripheral vascular disease, unspecified: Secondary | ICD-10-CM | POA: Diagnosis not present

## 2020-12-01 DIAGNOSIS — M48061 Spinal stenosis, lumbar region without neurogenic claudication: Secondary | ICD-10-CM | POA: Diagnosis not present

## 2020-12-01 DIAGNOSIS — R278 Other lack of coordination: Secondary | ICD-10-CM | POA: Diagnosis not present

## 2020-12-01 DIAGNOSIS — R488 Other symbolic dysfunctions: Secondary | ICD-10-CM | POA: Diagnosis not present

## 2020-12-01 DIAGNOSIS — I639 Cerebral infarction, unspecified: Secondary | ICD-10-CM | POA: Diagnosis not present

## 2020-12-01 DIAGNOSIS — R2681 Unsteadiness on feet: Secondary | ICD-10-CM | POA: Diagnosis not present

## 2020-12-01 DIAGNOSIS — I63432 Cerebral infarction due to embolism of left posterior cerebral artery: Secondary | ICD-10-CM | POA: Diagnosis not present

## 2020-12-01 DIAGNOSIS — I69351 Hemiplegia and hemiparesis following cerebral infarction affecting right dominant side: Secondary | ICD-10-CM | POA: Diagnosis not present

## 2020-12-02 DIAGNOSIS — I63432 Cerebral infarction due to embolism of left posterior cerebral artery: Secondary | ICD-10-CM | POA: Diagnosis not present

## 2020-12-02 DIAGNOSIS — I639 Cerebral infarction, unspecified: Secondary | ICD-10-CM | POA: Diagnosis not present

## 2020-12-02 DIAGNOSIS — R2681 Unsteadiness on feet: Secondary | ICD-10-CM | POA: Diagnosis not present

## 2020-12-02 DIAGNOSIS — R488 Other symbolic dysfunctions: Secondary | ICD-10-CM | POA: Diagnosis not present

## 2020-12-02 DIAGNOSIS — R278 Other lack of coordination: Secondary | ICD-10-CM | POA: Diagnosis not present

## 2020-12-02 DIAGNOSIS — M48061 Spinal stenosis, lumbar region without neurogenic claudication: Secondary | ICD-10-CM | POA: Diagnosis not present

## 2020-12-02 DIAGNOSIS — I69351 Hemiplegia and hemiparesis following cerebral infarction affecting right dominant side: Secondary | ICD-10-CM | POA: Diagnosis not present

## 2020-12-02 DIAGNOSIS — I739 Peripheral vascular disease, unspecified: Secondary | ICD-10-CM | POA: Diagnosis not present

## 2020-12-02 DIAGNOSIS — M6281 Muscle weakness (generalized): Secondary | ICD-10-CM | POA: Diagnosis not present

## 2020-12-03 ENCOUNTER — Emergency Department (HOSPITAL_COMMUNITY)
Admission: EM | Admit: 2020-12-03 | Discharge: 2020-12-04 | Disposition: A | Discharge status: Home / Self Care | Payer: Medicare Other | Attending: Emergency Medicine | Admitting: Emergency Medicine

## 2020-12-03 ENCOUNTER — Emergency Department (HOSPITAL_COMMUNITY): Payer: Medicare Other

## 2020-12-03 DIAGNOSIS — S022XXA Fracture of nasal bones, initial encounter for closed fracture: Secondary | ICD-10-CM

## 2020-12-03 DIAGNOSIS — S0181XA Laceration without foreign body of other part of head, initial encounter: Secondary | ICD-10-CM

## 2020-12-03 DIAGNOSIS — R040 Epistaxis: Secondary | ICD-10-CM

## 2020-12-03 DIAGNOSIS — S01511A Laceration without foreign body of lip, initial encounter: Secondary | ICD-10-CM

## 2020-12-03 DIAGNOSIS — S0990XA Unspecified injury of head, initial encounter: Secondary | ICD-10-CM

## 2020-12-03 DIAGNOSIS — R000 Tachycardia, unspecified: Secondary | ICD-10-CM | POA: Diagnosis not present

## 2020-12-03 DIAGNOSIS — I69351 Hemiplegia and hemiparesis following cerebral infarction affecting right dominant side: Secondary | ICD-10-CM | POA: Diagnosis not present

## 2020-12-03 DIAGNOSIS — R6889 Other general symptoms and signs: Secondary | ICD-10-CM | POA: Diagnosis not present

## 2020-12-03 DIAGNOSIS — R2681 Unsteadiness on feet: Secondary | ICD-10-CM | POA: Diagnosis not present

## 2020-12-03 DIAGNOSIS — M79641 Pain in right hand: Secondary | ICD-10-CM | POA: Diagnosis not present

## 2020-12-03 DIAGNOSIS — M6281 Muscle weakness (generalized): Secondary | ICD-10-CM | POA: Diagnosis not present

## 2020-12-03 DIAGNOSIS — Y92129 Unspecified place in nursing home as the place of occurrence of the external cause: Secondary | ICD-10-CM | POA: Insufficient documentation

## 2020-12-03 DIAGNOSIS — R278 Other lack of coordination: Secondary | ICD-10-CM | POA: Diagnosis not present

## 2020-12-03 DIAGNOSIS — W050XXA Fall from non-moving wheelchair, initial encounter: Secondary | ICD-10-CM | POA: Insufficient documentation

## 2020-12-03 DIAGNOSIS — S199XXA Unspecified injury of neck, initial encounter: Secondary | ICD-10-CM | POA: Diagnosis not present

## 2020-12-03 DIAGNOSIS — I63432 Cerebral infarction due to embolism of left posterior cerebral artery: Secondary | ICD-10-CM | POA: Diagnosis not present

## 2020-12-03 DIAGNOSIS — M47812 Spondylosis without myelopathy or radiculopathy, cervical region: Secondary | ICD-10-CM | POA: Diagnosis not present

## 2020-12-03 DIAGNOSIS — W19XXXA Unspecified fall, initial encounter: Secondary | ICD-10-CM | POA: Diagnosis not present

## 2020-12-03 DIAGNOSIS — Z743 Need for continuous supervision: Secondary | ICD-10-CM | POA: Diagnosis not present

## 2020-12-03 DIAGNOSIS — R58 Hemorrhage, not elsewhere classified: Secondary | ICD-10-CM | POA: Diagnosis not present

## 2020-12-03 DIAGNOSIS — I639 Cerebral infarction, unspecified: Secondary | ICD-10-CM | POA: Diagnosis not present

## 2020-12-03 DIAGNOSIS — I739 Peripheral vascular disease, unspecified: Secondary | ICD-10-CM | POA: Diagnosis not present

## 2020-12-03 DIAGNOSIS — M48061 Spinal stenosis, lumbar region without neurogenic claudication: Secondary | ICD-10-CM | POA: Diagnosis not present

## 2020-12-03 DIAGNOSIS — R404 Transient alteration of awareness: Secondary | ICD-10-CM | POA: Diagnosis not present

## 2020-12-03 DIAGNOSIS — R488 Other symbolic dysfunctions: Secondary | ICD-10-CM | POA: Diagnosis not present

## 2020-12-03 LAB — CBC
HCT: 45.1 % (ref 36.0–46.0)
Hemoglobin: 14.2 g/dL (ref 12.0–15.0)
MCH: 29.6 pg (ref 26.0–34.0)
MCHC: 31.5 g/dL (ref 30.0–36.0)
MCV: 94 fL (ref 80.0–100.0)
Platelets: 259 10*3/uL (ref 150–400)
RBC: 4.8 MIL/uL (ref 3.87–5.11)
RDW: 13.3 % (ref 11.5–15.5)
WBC: 9.4 10*3/uL (ref 4.0–10.5)
nRBC: 0 % (ref 0.0–0.2)

## 2020-12-03 LAB — I-STAT CHEM 8, ED
BUN: 25 mg/dL — ABNORMAL HIGH (ref 8–23)
Calcium, Ion: 1.22 mmol/L (ref 1.15–1.40)
Chloride: 103 mmol/L (ref 98–111)
Creatinine, Ser: 1.7 mg/dL — ABNORMAL HIGH (ref 0.44–1.00)
Glucose, Bld: 122 mg/dL — ABNORMAL HIGH (ref 70–99)
HCT: 45 % (ref 36.0–46.0)
Hemoglobin: 15.3 g/dL — ABNORMAL HIGH (ref 12.0–15.0)
Potassium: 4.3 mmol/L (ref 3.5–5.1)
Sodium: 141 mmol/L (ref 135–145)
TCO2: 25 mmol/L (ref 22–32)

## 2020-12-03 LAB — PROTIME-INR
INR: 1 (ref 0.8–1.2)
Prothrombin Time: 13.2 seconds (ref 11.4–15.2)

## 2020-12-03 MED ORDER — CEPHALEXIN 500 MG PO CAPS: 500.0000 mg | ORAL_CAPSULE | Freq: Two times a day (BID) | ORAL | 0 refills | Status: AC

## 2020-12-03 MED ORDER — LIDOCAINE-EPINEPHRINE 1 %-1:100000 IJ SOLN
10.0000 mL | Freq: Once | INTRAMUSCULAR | Status: DC
  Filled 2020-12-03: qty 1

## 2020-12-03 NOTE — Discharge Instructions (Signed)
You have 1 stitch in your upper lip and 4 stitches in your forehead on the right side, make sure that your family doctor takes all of these out in about 1 week.  You will need to take the antibiotic called cephalexin twice a day for the next 5 days until you follow-up with the ear nose and throat doctor when hopefully they can take out the packing from your nose.  You should expect to have some ongoing small amount of bleeding from the nose tonight but it should gradually stop.  Please do not take your blood thinner, Eliquis for 48 hours, then you may restart it.  ER for worsening symptoms.  You do have a small broken bone in your nose - this will not need surgery to get better  Ice for swelling

## 2020-12-03 NOTE — ED Notes (Signed)
Patient has a Small laceration at her R eye brow. Patients nose was bleeding with ems arrival but has since stopped. Patients nose is bruised. Patients vitals WNL. No other injuries noted. Patient alert and oriented x4.

## 2020-12-03 NOTE — ED Triage Notes (Signed)
Level 2 fall on thinners. Per ems pt was sitting in the wheelchair when she fell face first. No LOC. Pt has a laceration to the Right eye. Patients nose is also bleeding. Fall was witnessed by nursing home staff. Pt has hx of CVA and has cognitive deficits and right sided deficits. Pt is on eliquis for her hx of AFIB. Per staff this is patients mental status baseline.

## 2020-12-03 NOTE — Progress Notes (Signed)
Chaplain responded to Level 11 page to pt's room.  Chaplain invited in by pt and staff who were tending to pt's wounds.  Pt asked Chaplain for prayers.  Chaplain prayed at bedside.  [place]

## 2020-12-03 NOTE — ED Provider Notes (Signed)
[facility] EMERGENCY DEPARTMENT Provider Note   CSN: [id number] Arrival date & time: 12/03/20  1829     History Chief Complaint  Patient presents with  . Level 2 fall on thinners     Alyssa Odonnell is a 87 y.o. female.  HPI   This patient is an 87 year old female, she presents to the hospital today after having a fall at her nursing home where she was in a wheelchair, she leaned forward evidently to pick something up when she fell forward striking her head and face on the ground.  She suffered a laceration to the right forehead just above the eyebrow as well as hitting her nose and having epistaxis.  The staff at the nursing facility placed an unused tampon in the right nostril to help with the bleeding.  The patient does take Eliquis.  She denies any other injuries, has no problems with her vision.  This was acute in onset, occurred just prior to arrival, symptoms are persistent, she denies headache vomiting chest pain coughing shortness of breath or any other complaints and was feeling fine prior to this.  No past medical history on file.  There are no problems to display for this patient.  Reviewed PMH, PSH and SH.  OB History   No obstetric history on file.     No family history on file.     Home Medications Prior to Admission medications   Medication Sig Start Date End Date Taking? Authorizing Provider  cephALEXin (KEFLEX) 500 MG capsule Take 1 capsule (500 mg total) by mouth 2 (two) times daily for 5 days. 12/03/20 12/08/20 Yes Noemi Chapel, MD    Allergies    Patient has no allergy information on record.  Review of Systems   Review of Systems  All other systems reviewed and are negative.   Physical Exam Updated Vital Signs BP (!) 150/98   Pulse (!) 107   Temp 98.4 F (36.9 C)   Resp 19   Ht 1.651 m (5\' 5" )   SpO2 99%   Physical Exam Vitals and nursing note reviewed.  Constitutional:      General: She is not in acute distress.     Appearance: She is well-developed and well-nourished.  HENT:     Head: Normocephalic.     Comments: 2.5 cm laceration to the right forehead over the medial right eyebrow. There is a 1 cm laceration to the upper lip which appears to be through and through, the exterior is 1 cm, the interior is 2 mm.  There is minimal dental tenderness but no subluxation at all. Normal occlusion    Nose:     Comments: Tenderness over the nasal bridge, epistaxis on the right well controlled with tampon    Mouth/Throat:     Mouth: Oropharynx is clear and moist.     Pharynx: No oropharyngeal exudate.  Eyes:     General: No scleral icterus.       Right eye: No discharge.        Left eye: No discharge.     Extraocular Movements: EOM normal.     Conjunctiva/sclera: Conjunctivae normal.     Pupils: Pupils are equal, round, and reactive to light.  Neck:     Thyroid: No thyromegaly.     Vascular: No JVD.     Comments: No tenderness over the cervical spine, immobilized in a cervical collar Cardiovascular:     Rate and Rhythm: Normal rate and regular rhythm.  Pulses: Intact distal pulses.     Heart sounds: Normal heart sounds. No murmur heard. No friction rub. No gallop.   Pulmonary:     Effort: Pulmonary effort is normal. No respiratory distress.     Breath sounds: Normal breath sounds. No wheezing or rales.  Abdominal:     General: Bowel sounds are normal. There is no distension.     Palpations: Abdomen is soft. There is no mass.     Tenderness: There is no abdominal tenderness.  Musculoskeletal:        General: No tenderness or edema. Normal range of motion.     Comments: Moving all 4 extremities without any difficulties  Lymphadenopathy:     Cervical: No cervical adenopathy.  Skin:    General: Skin is warm and dry.     Findings: No erythema or rash.  Neurological:     Mental Status: She is alert.     Coordination: Coordination normal.     Comments: Normal level of alertness and speech   Psychiatric:        Mood and Affect: Mood and affect normal.        Behavior: Behavior normal.     ED Results / Procedures / Treatments   Labs (all labs ordered are listed, but only abnormal results are displayed) Labs Reviewed  I-STAT CHEM 8, ED - Abnormal; Notable for the following components:      Result Value   BUN 25 (*)    Creatinine, Ser 1.70 (*)    Glucose, Bld 122 (*)    Hemoglobin 15.3 (*)    All other components within normal limits  CBC  PROTIME-INR    EKG EKG Interpretation  Date/Time:  Tuesday December 03 2020 18:51:32 EST Ventricular Rate:  96 PR Interval:    QRS Duration: 81 QT Interval:  337 QTC Calculation: 426 R Axis:   45 Text Interpretation: Sinus or ectopic atrial rhythm No old tracing to compare Confirmed by Noemi Chapel [phone]) on 12/03/2020 8:50:25 PM   Radiology CT Head Wo Contrast  Result Date: 12/03/2020 CLINICAL DATA:  [place] face first out of wheelchair, epistaxis, right supraorbital laceration EXAM: CT HEAD WITHOUT CONTRAST TECHNIQUE: Contiguous axial images were obtained from the base of the skull through the vertex without intravenous contrast. COMPARISON:  10/15/2020 FINDINGS: Brain: Confluent hypodensities throughout the periventricular and subcortical white matter are stable, consistent with chronic small vessel ischemic change. Chronic left occipital cortical infarct. No acute infarct or hemorrhage. Lateral ventricles and midline structures are stable. No acute extra-axial fluid collections. No mass effect. Vascular: No hyperdense vessel or unexpected calcification. Skull: Right frontal scalp laceration and edema. No underlying fracture. Remainder of the calvarium is unremarkable. Sinuses/Orbits: Mild mucosal thickening right maxillary sinus. Minimally displaced nasal bone fracture. Other: None. IMPRESSION: 1. Chronic ischemic changes.  No acute intracranial process. 2. Right frontal scalp laceration. 3. Minimally displaced nasal bone  fracture. Electronically Signed   By: Randa Ngo M.D.   On: 12/03/2020 20:37   CT Cervical Spine Wo Contrast  Result Date: 12/03/2020 CLINICAL DATA:  [place] [address] face first out of wheelchair, facial laceration EXAM: CT CERVICAL SPINE WITHOUT CONTRAST TECHNIQUE: Multidetector CT imaging of the cervical spine was performed without intravenous contrast. Multiplanar CT image reconstructions were also generated. COMPARISON:  05/19/2020 FINDINGS: Alignment: Alignment remains anatomic. Skull base and vertebrae: No acute fracture. No primary bone lesion or focal pathologic process. Soft tissues and spinal canal: No prevertebral fluid or swelling. No visible canal hematoma. Disc levels:  Stable spondylosis at C5-6. No significant central canal or neural foraminal encroachment. Upper chest: Airway is patent. Lung apices are clear. Other: Reconstructed images demonstrate no additional findings. IMPRESSION: 1. Stable spondylosis at C5-6. 2. No acute cervical spine fracture. Electronically Signed   By: Randa Ngo M.D.   On: 12/03/2020 20:39   CT Maxillofacial Wo Contrast  Result Date: 12/03/2020 CLINICAL DATA:  [place] face first, epistaxis EXAM: CT MAXILLOFACIAL WITHOUT CONTRAST TECHNIQUE: Multidetector CT imaging of the maxillofacial structures was performed. Multiplanar CT image reconstructions were also generated. COMPARISON:  None. FINDINGS: Osseous: Minimally displaced and comminuted nasal bone fracture. No other acute displaced fractures. 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 Orbits: Negative. No traumatic or inflammatory finding. Sinuses: Mild mucosal thickening within the right maxillary sinus. There is opacification of the nasal passage. Soft tissues: Right supraorbital laceration and scalp edema. Prominent soft tissue swelling along the nasal bridge. Limited intracranial: No significant or unexpected finding. IMPRESSION: 1. Mildly displaced nasal bone fracture with overlying soft tissue  swelling. 2. Right supraorbital laceration and scalp edema. 3. Right maxillary sinus disease. Electronically Signed   By: Randa Ngo M.D.   On: 12/03/2020 20:45    Procedures .Marland KitchenLaceration Repair  Date/Time: 12/03/2020 8:51 PM Performed by: Noemi Chapel, MD Authorized by: Noemi Chapel, MD   Consent:    Consent obtained:  Verbal   Consent given by:  Patient   Risks discussed:  Infection, pain, need for additional repair, poor cosmetic result and poor wound healing   Alternatives discussed:  No treatment and delayed treatment Universal protocol:    Relevant documents present and verified: yes     Test results available: yes     Imaging studies available: yes     Immediately prior to procedure, a time out was called: yes     Patient identity confirmed:  Verbally with patient Anesthesia:    Anesthesia method:  Local infiltration   Local anesthetic:  Lidocaine 1% WITH epi Laceration details:    Location:  Face   Face location:  Forehead   Length (cm):  2.5   Depth (mm):  3 Pre-procedure details:    Preparation:  Patient was prepped and draped in usual sterile fashion and imaging obtained to evaluate for foreign bodies Exploration:    Hemostasis achieved with:  Direct pressure   Imaging obtained: x-ray     Imaging outcome: foreign body not noted     Wound exploration: wound explored through full range of motion     Wound extent: no fascia violation noted, no foreign bodies/material noted, no muscle damage noted, no nerve damage noted, no tendon damage noted, no underlying fracture noted and no vascular damage noted   Treatment:    Area cleansed with:  Betadine and saline   Amount of cleaning:  Standard   Irrigation solution:  Sterile saline   Irrigation volume:  200   Irrigation method:  Syringe   Visualized foreign bodies/material removed: no     Debridement:  None   Undermining:  None   Scar revision: no   Skin repair:    Repair method:  Sutures   Suture size:  5-0    Suture material:  Prolene   Suture technique:  Simple interrupted   Number of sutures:  4 Approximation:    Approximation:  Close Repair type:    Repair type:  Simple Post-procedure details:    Dressing:  Antibiotic ointment and sterile dressing   Procedure completion:  Tolerated well, no  immediate complications Comments:       Marland KitchenMarland KitchenLaceration Repair  Date/Time: 12/03/2020 8:53 PM Performed by: Noemi Chapel, MD Authorized by: Noemi Chapel, MD   Consent:    Consent obtained:  Verbal   Consent given by:  Patient   Risks discussed:  Infection, pain, need for additional repair, poor cosmetic result and poor wound healing   Alternatives discussed:  No treatment and delayed treatment Universal protocol:    Relevant documents present and verified: yes     Test results available: yes     Imaging studies available: yes     Required blood products, implants, devices, and special equipment available: yes     Immediately prior to procedure, a time out was called: yes     Patient identity confirmed:  Verbally with patient Anesthesia:    Anesthesia method:  Local infiltration   Local anesthetic:  Lidocaine 1% WITH epi Laceration details:    Location:  Lip   Lip location:  Upper lip, full thickness   Vermilion border involved: no     Height of lip laceration:  More than half vertical height   Length (cm):  1   Depth (mm):  4 Pre-procedure details:    Preparation:  Patient was prepped and draped in usual sterile fashion and imaging obtained to evaluate for foreign bodies Exploration:    Hemostasis achieved with:  Direct pressure   Imaging obtained: x-ray     Imaging outcome: foreign body not noted     Wound exploration: wound explored through full range of motion     Wound extent: no fascia violation noted, no foreign bodies/material noted, no muscle damage noted, no nerve damage noted, no tendon damage noted, no underlying fracture noted and no vascular damage noted   Treatment:     Area cleansed with:  Betadine and saline   Amount of cleaning:  Standard   Irrigation solution:  Sterile saline   Irrigation volume:  100   Irrigation method:  Syringe   Debridement:  None   Undermining:  None   Scar revision: no   Skin repair:    Repair method:  Sutures   Suture size:  5-0   Suture material:  Prolene   Suture technique:  Simple interrupted   Number of sutures:  1 Approximation:    Approximation:  Close Repair type:    Repair type:  Simple Post-procedure details:    Dressing:  Antibiotic ointment and sterile dressing   Procedure completion:  Tolerated well, no immediate complications Comments:       .Epistaxis Management  Date/Time: 12/03/2020 10:18 PM Performed by: Noemi Chapel, MD Authorized by: Noemi Chapel, MD   Consent:    Consent obtained:  Verbal   Consent given by:  Patient   Risks, benefits, and alternatives were discussed: yes     Risks discussed:  Bleeding, infection, nasal injury and pain   Alternatives discussed:  Delayed treatment, alternative treatment and observation Universal protocol:    Test results available: yes     Imaging studies available: yes     Required blood products, implants, devices, and special equipment available: yes     Immediately prior to procedure, a time out was called: yes     Patient identity confirmed:  Verbally with patient Anesthesia:    Anesthesia method:  None Procedure details:    Treatment site:  R posterior   Treatment method:  Nasal balloon   Treatment complexity:  Extensive   Treatment episode: initial   Post-procedure  details:    Assessment:  Bleeding decreased   Procedure completion:  Tolerated Comments:           Medications Ordered in ED Medications  lidocaine-EPINEPHrine (XYLOCAINE W/EPI) 1 %-1:100000 (with pres) injection 10 mL (10 mLs Other Handoff 12/03/20 1855)    ED Course  I have reviewed the triage vital signs and the nursing notes.  Pertinent labs & imaging results that  were available during my care of the patient were reviewed by me and considered in my medical decision making (see chart for details).    MDM Rules/Calculators/A&P                          We will need imaging of head cervical spine and maxillofacial bones, epistaxis control, repair of laceration, patient agreeable  Labs pending, imaging pending  CT of the maxillofacial bones show a minimal nasal bone fracture, there is no signs of maxillofacial's fractures otherwise. She has no intracranial hemorrhage and no cervical spine fractures.  Cervical immobilizer removed.  The patient was unable to blow out the clot of the right anterior nare, it appears that this was actually more posterior in location and she was unable to get rid of the clot.  I was unable to remove it with alligator forceps.  I was forced to place a nasal balloon, rapid Rhino which was 7.5 cm and was able to push the clot posteriorly into the pharynx, she was able to clear it by mouth.  The bleeding has significantly slowed, I think she needs to be off of her anticoagulant for a couple of days for that reason.  She will be placed on an antibiotic, her lacerations have been adequately repaired, she appears stable for discharge back to her facility.  Wound care given, lacerations repaired x2.  Final Clinical Impression(s) / ED Diagnoses Final diagnoses:  Epistaxis  Laceration of forehead, initial encounter  Lip laceration, initial encounter  Minor head injury, initial encounter  Closed fracture of nasal bone, initial encounter    Rx / DC Orders ED Discharge Orders         Ordered    cephALEXin (KEFLEX) 500 MG capsule  2 times daily        12/03/20 2309           Noemi Chapel, MD 12/03/20 2311

## 2020-12-04 ENCOUNTER — Emergency Department (HOSPITAL_COMMUNITY): Payer: Medicare Other

## 2020-12-04 DIAGNOSIS — R5381 Other malaise: Secondary | ICD-10-CM | POA: Diagnosis not present

## 2020-12-04 DIAGNOSIS — Z7401 Bed confinement status: Secondary | ICD-10-CM | POA: Diagnosis not present

## 2020-12-04 DIAGNOSIS — Z743 Need for continuous supervision: Secondary | ICD-10-CM | POA: Diagnosis not present

## 2020-12-04 DIAGNOSIS — S022XXA Fracture of nasal bones, initial encounter for closed fracture: Secondary | ICD-10-CM | POA: Diagnosis not present

## 2020-12-04 DIAGNOSIS — N390 Urinary tract infection, site not specified: Secondary | ICD-10-CM | POA: Diagnosis not present

## 2020-12-04 DIAGNOSIS — W19XXXA Unspecified fall, initial encounter: Secondary | ICD-10-CM | POA: Diagnosis not present

## 2020-12-04 DIAGNOSIS — M79641 Pain in right hand: Secondary | ICD-10-CM | POA: Diagnosis not present

## 2020-12-04 DIAGNOSIS — S0181XA Laceration without foreign body of other part of head, initial encounter: Secondary | ICD-10-CM | POA: Diagnosis not present

## 2020-12-04 DIAGNOSIS — K1379 Other lesions of oral mucosa: Secondary | ICD-10-CM | POA: Diagnosis not present

## 2020-12-04 DIAGNOSIS — M2550 Pain in unspecified joint: Secondary | ICD-10-CM | POA: Diagnosis not present

## 2020-12-04 MED ORDER — HYDROCODONE-ACETAMINOPHEN 5-325 MG PO TABS
1.0000 | ORAL_TABLET | Freq: Once | ORAL | Status: AC
  Administered 2020-12-04: 1 via ORAL
  Filled 2020-12-04: qty 1

## 2020-12-04 NOTE — ED Notes (Signed)
Patient up for discharge and waiting for PTAR. This RN spoke to [place] about patients HR being 140-150s.. EKG captured by [facility].

## 2020-12-04 NOTE — ED Notes (Addendum)
Transport here, pt c/o pain to right middle finger/hand. MD aware, Xray ordered and done.

## 2020-12-04 NOTE — ED Provider Notes (Signed)
I was called to the patient's room.  She is discharged awaiting on Peter.  Per nursing, continued to have expressed access.  Patient with nasal balloon in place.  Copious dried blood noted inferior to the naris.  Requested nursing clean her to get a better idea of how much she is actually bleeding.  5 cc of additional air placed in the balloon.  Slight oozing noted but mostly tamponade.  I was additionally called to the patient's room at discharge as she was complaining of right fourth digit and hand pain.  She does have some bruising and swelling at the MCP joint of the right fourth digit.  X-ray was obtained shows no acute fracture.  Recommend ice.  Patient stable for discharge with PTAR.   Physical Exam  BP (!) 143/87 (BP Location: Left Arm)   Pulse 92   Temp 98.4 F (36.9 C)   Resp 20   Ht 1.651 m (5\' 5" )   SpO2 98%     Dina Rich, Barbette Hair, MD 12/04/20 [phone]

## 2020-12-04 NOTE — ED Notes (Signed)
Pts HR 110s-160. EKG obtained, MD aware.

## 2020-12-04 NOTE — ED Notes (Signed)
PTAR here. Pt medicated. Report called by prior nurse.

## 2020-12-04 NOTE — ED Notes (Signed)
Patient waiting for PTAR. patient's nose still bleeding. Horton MD made aware.

## 2020-12-05 ENCOUNTER — Telehealth: Payer: Self-pay | Admitting: Neurology

## 2020-12-05 DIAGNOSIS — E782 Mixed hyperlipidemia: Secondary | ICD-10-CM | POA: Diagnosis not present

## 2020-12-05 DIAGNOSIS — I10 Essential (primary) hypertension: Secondary | ICD-10-CM | POA: Diagnosis not present

## 2020-12-05 DIAGNOSIS — K219 Gastro-esophageal reflux disease without esophagitis: Secondary | ICD-10-CM | POA: Diagnosis not present

## 2020-12-05 DIAGNOSIS — I69354 Hemiplegia and hemiparesis following cerebral infarction affecting left non-dominant side: Secondary | ICD-10-CM | POA: Diagnosis not present

## 2020-12-05 DIAGNOSIS — E039 Hypothyroidism, unspecified: Secondary | ICD-10-CM | POA: Diagnosis not present

## 2020-12-05 DIAGNOSIS — I639 Cerebral infarction, unspecified: Secondary | ICD-10-CM | POA: Diagnosis not present

## 2020-12-05 DIAGNOSIS — R2681 Unsteadiness on feet: Secondary | ICD-10-CM | POA: Diagnosis not present

## 2020-12-05 DIAGNOSIS — G8191 Hemiplegia, unspecified affecting right dominant side: Secondary | ICD-10-CM | POA: Diagnosis not present

## 2020-12-05 DIAGNOSIS — M1990 Unspecified osteoarthritis, unspecified site: Secondary | ICD-10-CM | POA: Diagnosis not present

## 2020-12-05 DIAGNOSIS — M6281 Muscle weakness (generalized): Secondary | ICD-10-CM | POA: Diagnosis not present

## 2020-12-05 NOTE — Telephone Encounter (Signed)
[facility] called to schedule pt's 6 month f/u, they confirmed pt has no new symptoms.  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

## 2020-12-06 DIAGNOSIS — I639 Cerebral infarction, unspecified: Secondary | ICD-10-CM | POA: Diagnosis not present

## 2020-12-06 DIAGNOSIS — M6281 Muscle weakness (generalized): Secondary | ICD-10-CM | POA: Diagnosis not present

## 2020-12-06 DIAGNOSIS — I69351 Hemiplegia and hemiparesis following cerebral infarction affecting right dominant side: Secondary | ICD-10-CM | POA: Diagnosis not present

## 2020-12-06 DIAGNOSIS — I739 Peripheral vascular disease, unspecified: Secondary | ICD-10-CM | POA: Diagnosis not present

## 2020-12-06 DIAGNOSIS — R278 Other lack of coordination: Secondary | ICD-10-CM | POA: Diagnosis not present

## 2020-12-06 DIAGNOSIS — M48061 Spinal stenosis, lumbar region without neurogenic claudication: Secondary | ICD-10-CM | POA: Diagnosis not present

## 2020-12-06 DIAGNOSIS — I63432 Cerebral infarction due to embolism of left posterior cerebral artery: Secondary | ICD-10-CM | POA: Diagnosis not present

## 2020-12-06 DIAGNOSIS — R2681 Unsteadiness on feet: Secondary | ICD-10-CM | POA: Diagnosis not present

## 2020-12-06 DIAGNOSIS — R488 Other symbolic dysfunctions: Secondary | ICD-10-CM | POA: Diagnosis not present

## 2020-12-08 DIAGNOSIS — R2681 Unsteadiness on feet: Secondary | ICD-10-CM | POA: Diagnosis not present

## 2020-12-08 DIAGNOSIS — I739 Peripheral vascular disease, unspecified: Secondary | ICD-10-CM | POA: Diagnosis not present

## 2020-12-08 DIAGNOSIS — I69351 Hemiplegia and hemiparesis following cerebral infarction affecting right dominant side: Secondary | ICD-10-CM | POA: Diagnosis not present

## 2020-12-08 DIAGNOSIS — I639 Cerebral infarction, unspecified: Secondary | ICD-10-CM | POA: Diagnosis not present

## 2020-12-08 DIAGNOSIS — R278 Other lack of coordination: Secondary | ICD-10-CM | POA: Diagnosis not present

## 2020-12-08 DIAGNOSIS — M6281 Muscle weakness (generalized): Secondary | ICD-10-CM | POA: Diagnosis not present

## 2020-12-08 DIAGNOSIS — R488 Other symbolic dysfunctions: Secondary | ICD-10-CM | POA: Diagnosis not present

## 2020-12-08 DIAGNOSIS — I63432 Cerebral infarction due to embolism of left posterior cerebral artery: Secondary | ICD-10-CM | POA: Diagnosis not present

## 2020-12-08 DIAGNOSIS — M48061 Spinal stenosis, lumbar region without neurogenic claudication: Secondary | ICD-10-CM | POA: Diagnosis not present

## 2020-12-09 DIAGNOSIS — S60221A Contusion of right hand, initial encounter: Secondary | ICD-10-CM | POA: Diagnosis not present

## 2020-12-09 DIAGNOSIS — I69351 Hemiplegia and hemiparesis following cerebral infarction affecting right dominant side: Secondary | ICD-10-CM | POA: Diagnosis not present

## 2020-12-09 DIAGNOSIS — M48061 Spinal stenosis, lumbar region without neurogenic claudication: Secondary | ICD-10-CM | POA: Diagnosis not present

## 2020-12-09 DIAGNOSIS — S0181XD Laceration without foreign body of other part of head, subsequent encounter: Secondary | ICD-10-CM | POA: Diagnosis not present

## 2020-12-09 DIAGNOSIS — S0992XD Unspecified injury of nose, subsequent encounter: Secondary | ICD-10-CM | POA: Diagnosis not present

## 2020-12-09 DIAGNOSIS — R2681 Unsteadiness on feet: Secondary | ICD-10-CM | POA: Diagnosis not present

## 2020-12-09 DIAGNOSIS — I639 Cerebral infarction, unspecified: Secondary | ICD-10-CM | POA: Diagnosis not present

## 2020-12-09 DIAGNOSIS — G8191 Hemiplegia, unspecified affecting right dominant side: Secondary | ICD-10-CM | POA: Diagnosis not present

## 2020-12-09 DIAGNOSIS — R040 Epistaxis: Secondary | ICD-10-CM | POA: Diagnosis not present

## 2020-12-09 DIAGNOSIS — M6281 Muscle weakness (generalized): Secondary | ICD-10-CM | POA: Diagnosis not present

## 2020-12-09 DIAGNOSIS — I739 Peripheral vascular disease, unspecified: Secondary | ICD-10-CM | POA: Diagnosis not present

## 2020-12-09 DIAGNOSIS — R278 Other lack of coordination: Secondary | ICD-10-CM | POA: Diagnosis not present

## 2020-12-09 DIAGNOSIS — I63432 Cerebral infarction due to embolism of left posterior cerebral artery: Secondary | ICD-10-CM | POA: Diagnosis not present

## 2020-12-09 DIAGNOSIS — R488 Other symbolic dysfunctions: Secondary | ICD-10-CM | POA: Diagnosis not present

## 2020-12-09 DIAGNOSIS — N390 Urinary tract infection, site not specified: Secondary | ICD-10-CM | POA: Diagnosis not present

## 2020-12-10 DIAGNOSIS — R488 Other symbolic dysfunctions: Secondary | ICD-10-CM | POA: Diagnosis not present

## 2020-12-10 DIAGNOSIS — I10 Essential (primary) hypertension: Secondary | ICD-10-CM | POA: Diagnosis not present

## 2020-12-10 DIAGNOSIS — G8191 Hemiplegia, unspecified affecting right dominant side: Secondary | ICD-10-CM | POA: Diagnosis not present

## 2020-12-10 DIAGNOSIS — I69354 Hemiplegia and hemiparesis following cerebral infarction affecting left non-dominant side: Secondary | ICD-10-CM | POA: Diagnosis not present

## 2020-12-10 DIAGNOSIS — M48061 Spinal stenosis, lumbar region without neurogenic claudication: Secondary | ICD-10-CM | POA: Diagnosis not present

## 2020-12-10 DIAGNOSIS — I739 Peripheral vascular disease, unspecified: Secondary | ICD-10-CM | POA: Diagnosis not present

## 2020-12-10 DIAGNOSIS — M6281 Muscle weakness (generalized): Secondary | ICD-10-CM | POA: Diagnosis not present

## 2020-12-10 DIAGNOSIS — M1990 Unspecified osteoarthritis, unspecified site: Secondary | ICD-10-CM | POA: Diagnosis not present

## 2020-12-10 DIAGNOSIS — I63432 Cerebral infarction due to embolism of left posterior cerebral artery: Secondary | ICD-10-CM | POA: Diagnosis not present

## 2020-12-10 DIAGNOSIS — E039 Hypothyroidism, unspecified: Secondary | ICD-10-CM | POA: Diagnosis not present

## 2020-12-10 DIAGNOSIS — K219 Gastro-esophageal reflux disease without esophagitis: Secondary | ICD-10-CM | POA: Diagnosis not present

## 2020-12-10 DIAGNOSIS — R2681 Unsteadiness on feet: Secondary | ICD-10-CM | POA: Diagnosis not present

## 2020-12-10 DIAGNOSIS — I69351 Hemiplegia and hemiparesis following cerebral infarction affecting right dominant side: Secondary | ICD-10-CM | POA: Diagnosis not present

## 2020-12-10 DIAGNOSIS — I639 Cerebral infarction, unspecified: Secondary | ICD-10-CM | POA: Diagnosis not present

## 2020-12-10 DIAGNOSIS — R278 Other lack of coordination: Secondary | ICD-10-CM | POA: Diagnosis not present

## 2020-12-10 DIAGNOSIS — E782 Mixed hyperlipidemia: Secondary | ICD-10-CM | POA: Diagnosis not present

## 2020-12-11 DIAGNOSIS — M48061 Spinal stenosis, lumbar region without neurogenic claudication: Secondary | ICD-10-CM | POA: Diagnosis not present

## 2020-12-11 DIAGNOSIS — R2681 Unsteadiness on feet: Secondary | ICD-10-CM | POA: Diagnosis not present

## 2020-12-11 DIAGNOSIS — I639 Cerebral infarction, unspecified: Secondary | ICD-10-CM | POA: Diagnosis not present

## 2020-12-11 DIAGNOSIS — M6281 Muscle weakness (generalized): Secondary | ICD-10-CM | POA: Diagnosis not present

## 2020-12-11 DIAGNOSIS — R488 Other symbolic dysfunctions: Secondary | ICD-10-CM | POA: Diagnosis not present

## 2020-12-11 DIAGNOSIS — R278 Other lack of coordination: Secondary | ICD-10-CM | POA: Diagnosis not present

## 2020-12-11 DIAGNOSIS — I69351 Hemiplegia and hemiparesis following cerebral infarction affecting right dominant side: Secondary | ICD-10-CM | POA: Diagnosis not present

## 2020-12-11 DIAGNOSIS — I739 Peripheral vascular disease, unspecified: Secondary | ICD-10-CM | POA: Diagnosis not present

## 2020-12-11 DIAGNOSIS — I63432 Cerebral infarction due to embolism of left posterior cerebral artery: Secondary | ICD-10-CM | POA: Diagnosis not present

## 2020-12-12 DIAGNOSIS — M1990 Unspecified osteoarthritis, unspecified site: Secondary | ICD-10-CM | POA: Diagnosis not present

## 2020-12-12 DIAGNOSIS — I69354 Hemiplegia and hemiparesis following cerebral infarction affecting left non-dominant side: Secondary | ICD-10-CM | POA: Diagnosis not present

## 2020-12-12 DIAGNOSIS — I10 Essential (primary) hypertension: Secondary | ICD-10-CM | POA: Diagnosis not present

## 2020-12-12 DIAGNOSIS — M48061 Spinal stenosis, lumbar region without neurogenic claudication: Secondary | ICD-10-CM | POA: Diagnosis not present

## 2020-12-12 DIAGNOSIS — E782 Mixed hyperlipidemia: Secondary | ICD-10-CM | POA: Diagnosis not present

## 2020-12-12 DIAGNOSIS — M6281 Muscle weakness (generalized): Secondary | ICD-10-CM | POA: Diagnosis not present

## 2020-12-12 DIAGNOSIS — G8191 Hemiplegia, unspecified affecting right dominant side: Secondary | ICD-10-CM | POA: Diagnosis not present

## 2020-12-12 DIAGNOSIS — I739 Peripheral vascular disease, unspecified: Secondary | ICD-10-CM | POA: Diagnosis not present

## 2020-12-12 DIAGNOSIS — R278 Other lack of coordination: Secondary | ICD-10-CM | POA: Diagnosis not present

## 2020-12-12 DIAGNOSIS — E039 Hypothyroidism, unspecified: Secondary | ICD-10-CM | POA: Diagnosis not present

## 2020-12-12 DIAGNOSIS — R2681 Unsteadiness on feet: Secondary | ICD-10-CM | POA: Diagnosis not present

## 2020-12-12 DIAGNOSIS — I63432 Cerebral infarction due to embolism of left posterior cerebral artery: Secondary | ICD-10-CM | POA: Diagnosis not present

## 2020-12-12 DIAGNOSIS — R488 Other symbolic dysfunctions: Secondary | ICD-10-CM | POA: Diagnosis not present

## 2020-12-12 DIAGNOSIS — I69351 Hemiplegia and hemiparesis following cerebral infarction affecting right dominant side: Secondary | ICD-10-CM | POA: Diagnosis not present

## 2020-12-12 DIAGNOSIS — I639 Cerebral infarction, unspecified: Secondary | ICD-10-CM | POA: Diagnosis not present

## 2020-12-12 DIAGNOSIS — K219 Gastro-esophageal reflux disease without esophagitis: Secondary | ICD-10-CM | POA: Diagnosis not present

## 2020-12-14 DIAGNOSIS — R488 Other symbolic dysfunctions: Secondary | ICD-10-CM | POA: Diagnosis not present

## 2020-12-14 DIAGNOSIS — M48061 Spinal stenosis, lumbar region without neurogenic claudication: Secondary | ICD-10-CM | POA: Diagnosis not present

## 2020-12-14 DIAGNOSIS — M6281 Muscle weakness (generalized): Secondary | ICD-10-CM | POA: Diagnosis not present

## 2020-12-14 DIAGNOSIS — I63432 Cerebral infarction due to embolism of left posterior cerebral artery: Secondary | ICD-10-CM | POA: Diagnosis not present

## 2020-12-14 DIAGNOSIS — I69351 Hemiplegia and hemiparesis following cerebral infarction affecting right dominant side: Secondary | ICD-10-CM | POA: Diagnosis not present

## 2020-12-14 DIAGNOSIS — I639 Cerebral infarction, unspecified: Secondary | ICD-10-CM | POA: Diagnosis not present

## 2020-12-14 DIAGNOSIS — I739 Peripheral vascular disease, unspecified: Secondary | ICD-10-CM | POA: Diagnosis not present

## 2020-12-14 DIAGNOSIS — R2681 Unsteadiness on feet: Secondary | ICD-10-CM | POA: Diagnosis not present

## 2020-12-14 DIAGNOSIS — R278 Other lack of coordination: Secondary | ICD-10-CM | POA: Diagnosis not present

## 2020-12-16 DIAGNOSIS — R488 Other symbolic dysfunctions: Secondary | ICD-10-CM | POA: Diagnosis not present

## 2020-12-16 DIAGNOSIS — R278 Other lack of coordination: Secondary | ICD-10-CM | POA: Diagnosis not present

## 2020-12-16 DIAGNOSIS — M48061 Spinal stenosis, lumbar region without neurogenic claudication: Secondary | ICD-10-CM | POA: Diagnosis not present

## 2020-12-16 DIAGNOSIS — R2681 Unsteadiness on feet: Secondary | ICD-10-CM | POA: Diagnosis not present

## 2020-12-16 DIAGNOSIS — I63432 Cerebral infarction due to embolism of left posterior cerebral artery: Secondary | ICD-10-CM | POA: Diagnosis not present

## 2020-12-16 DIAGNOSIS — I639 Cerebral infarction, unspecified: Secondary | ICD-10-CM | POA: Diagnosis not present

## 2020-12-16 DIAGNOSIS — I69351 Hemiplegia and hemiparesis following cerebral infarction affecting right dominant side: Secondary | ICD-10-CM | POA: Diagnosis not present

## 2020-12-16 DIAGNOSIS — I739 Peripheral vascular disease, unspecified: Secondary | ICD-10-CM | POA: Diagnosis not present

## 2020-12-16 DIAGNOSIS — M6281 Muscle weakness (generalized): Secondary | ICD-10-CM | POA: Diagnosis not present

## 2020-12-17 DIAGNOSIS — R278 Other lack of coordination: Secondary | ICD-10-CM | POA: Diagnosis not present

## 2020-12-17 DIAGNOSIS — R2681 Unsteadiness on feet: Secondary | ICD-10-CM | POA: Diagnosis not present

## 2020-12-17 DIAGNOSIS — I639 Cerebral infarction, unspecified: Secondary | ICD-10-CM | POA: Diagnosis not present

## 2020-12-17 DIAGNOSIS — I63432 Cerebral infarction due to embolism of left posterior cerebral artery: Secondary | ICD-10-CM | POA: Diagnosis not present

## 2020-12-17 DIAGNOSIS — I69351 Hemiplegia and hemiparesis following cerebral infarction affecting right dominant side: Secondary | ICD-10-CM | POA: Diagnosis not present

## 2020-12-17 DIAGNOSIS — M6281 Muscle weakness (generalized): Secondary | ICD-10-CM | POA: Diagnosis not present

## 2020-12-17 DIAGNOSIS — R488 Other symbolic dysfunctions: Secondary | ICD-10-CM | POA: Diagnosis not present

## 2020-12-17 DIAGNOSIS — M48061 Spinal stenosis, lumbar region without neurogenic claudication: Secondary | ICD-10-CM | POA: Diagnosis not present

## 2020-12-17 DIAGNOSIS — I739 Peripheral vascular disease, unspecified: Secondary | ICD-10-CM | POA: Diagnosis not present

## 2020-12-18 DIAGNOSIS — I639 Cerebral infarction, unspecified: Secondary | ICD-10-CM | POA: Diagnosis not present

## 2020-12-18 DIAGNOSIS — R488 Other symbolic dysfunctions: Secondary | ICD-10-CM | POA: Diagnosis not present

## 2020-12-18 DIAGNOSIS — I63432 Cerebral infarction due to embolism of left posterior cerebral artery: Secondary | ICD-10-CM | POA: Diagnosis not present

## 2020-12-18 DIAGNOSIS — R2681 Unsteadiness on feet: Secondary | ICD-10-CM | POA: Diagnosis not present

## 2020-12-18 DIAGNOSIS — M48061 Spinal stenosis, lumbar region without neurogenic claudication: Secondary | ICD-10-CM | POA: Diagnosis not present

## 2020-12-18 DIAGNOSIS — R278 Other lack of coordination: Secondary | ICD-10-CM | POA: Diagnosis not present

## 2020-12-18 DIAGNOSIS — M6281 Muscle weakness (generalized): Secondary | ICD-10-CM | POA: Diagnosis not present

## 2020-12-18 DIAGNOSIS — I69351 Hemiplegia and hemiparesis following cerebral infarction affecting right dominant side: Secondary | ICD-10-CM | POA: Diagnosis not present

## 2020-12-18 DIAGNOSIS — I739 Peripheral vascular disease, unspecified: Secondary | ICD-10-CM | POA: Diagnosis not present

## 2020-12-19 DIAGNOSIS — I63432 Cerebral infarction due to embolism of left posterior cerebral artery: Secondary | ICD-10-CM | POA: Diagnosis not present

## 2020-12-19 DIAGNOSIS — R488 Other symbolic dysfunctions: Secondary | ICD-10-CM | POA: Diagnosis not present

## 2020-12-19 DIAGNOSIS — M48061 Spinal stenosis, lumbar region without neurogenic claudication: Secondary | ICD-10-CM | POA: Diagnosis not present

## 2020-12-19 DIAGNOSIS — I739 Peripheral vascular disease, unspecified: Secondary | ICD-10-CM | POA: Diagnosis not present

## 2020-12-19 DIAGNOSIS — I639 Cerebral infarction, unspecified: Secondary | ICD-10-CM | POA: Diagnosis not present

## 2020-12-19 DIAGNOSIS — M6281 Muscle weakness (generalized): Secondary | ICD-10-CM | POA: Diagnosis not present

## 2020-12-19 DIAGNOSIS — R278 Other lack of coordination: Secondary | ICD-10-CM | POA: Diagnosis not present

## 2020-12-19 DIAGNOSIS — R2681 Unsteadiness on feet: Secondary | ICD-10-CM | POA: Diagnosis not present

## 2020-12-19 DIAGNOSIS — I69351 Hemiplegia and hemiparesis following cerebral infarction affecting right dominant side: Secondary | ICD-10-CM | POA: Diagnosis not present

## 2020-12-20 DIAGNOSIS — R488 Other symbolic dysfunctions: Secondary | ICD-10-CM | POA: Diagnosis not present

## 2020-12-20 DIAGNOSIS — R2681 Unsteadiness on feet: Secondary | ICD-10-CM | POA: Diagnosis not present

## 2020-12-20 DIAGNOSIS — I639 Cerebral infarction, unspecified: Secondary | ICD-10-CM | POA: Diagnosis not present

## 2020-12-20 DIAGNOSIS — I63432 Cerebral infarction due to embolism of left posterior cerebral artery: Secondary | ICD-10-CM | POA: Diagnosis not present

## 2020-12-20 DIAGNOSIS — R278 Other lack of coordination: Secondary | ICD-10-CM | POA: Diagnosis not present

## 2020-12-20 DIAGNOSIS — I739 Peripheral vascular disease, unspecified: Secondary | ICD-10-CM | POA: Diagnosis not present

## 2020-12-20 DIAGNOSIS — M48061 Spinal stenosis, lumbar region without neurogenic claudication: Secondary | ICD-10-CM | POA: Diagnosis not present

## 2020-12-20 DIAGNOSIS — I69351 Hemiplegia and hemiparesis following cerebral infarction affecting right dominant side: Secondary | ICD-10-CM | POA: Diagnosis not present

## 2020-12-20 DIAGNOSIS — M6281 Muscle weakness (generalized): Secondary | ICD-10-CM | POA: Diagnosis not present

## 2020-12-23 DIAGNOSIS — I739 Peripheral vascular disease, unspecified: Secondary | ICD-10-CM | POA: Diagnosis not present

## 2020-12-23 DIAGNOSIS — M6281 Muscle weakness (generalized): Secondary | ICD-10-CM | POA: Diagnosis not present

## 2020-12-23 DIAGNOSIS — I639 Cerebral infarction, unspecified: Secondary | ICD-10-CM | POA: Diagnosis not present

## 2020-12-23 DIAGNOSIS — I63432 Cerebral infarction due to embolism of left posterior cerebral artery: Secondary | ICD-10-CM | POA: Diagnosis not present

## 2020-12-23 DIAGNOSIS — R278 Other lack of coordination: Secondary | ICD-10-CM | POA: Diagnosis not present

## 2020-12-23 DIAGNOSIS — R2681 Unsteadiness on feet: Secondary | ICD-10-CM | POA: Diagnosis not present

## 2020-12-23 DIAGNOSIS — R488 Other symbolic dysfunctions: Secondary | ICD-10-CM | POA: Diagnosis not present

## 2020-12-23 DIAGNOSIS — I69351 Hemiplegia and hemiparesis following cerebral infarction affecting right dominant side: Secondary | ICD-10-CM | POA: Diagnosis not present

## 2020-12-23 DIAGNOSIS — M48061 Spinal stenosis, lumbar region without neurogenic claudication: Secondary | ICD-10-CM | POA: Diagnosis not present

## 2020-12-24 DIAGNOSIS — S0181XA Laceration without foreign body of other part of head, initial encounter: Secondary | ICD-10-CM | POA: Diagnosis not present

## 2020-12-25 DIAGNOSIS — R278 Other lack of coordination: Secondary | ICD-10-CM | POA: Diagnosis not present

## 2020-12-25 DIAGNOSIS — I63432 Cerebral infarction due to embolism of left posterior cerebral artery: Secondary | ICD-10-CM | POA: Diagnosis not present

## 2020-12-25 DIAGNOSIS — I739 Peripheral vascular disease, unspecified: Secondary | ICD-10-CM | POA: Diagnosis not present

## 2020-12-25 DIAGNOSIS — I639 Cerebral infarction, unspecified: Secondary | ICD-10-CM | POA: Diagnosis not present

## 2020-12-25 DIAGNOSIS — I69351 Hemiplegia and hemiparesis following cerebral infarction affecting right dominant side: Secondary | ICD-10-CM | POA: Diagnosis not present

## 2020-12-25 DIAGNOSIS — R2681 Unsteadiness on feet: Secondary | ICD-10-CM | POA: Diagnosis not present

## 2020-12-25 DIAGNOSIS — R488 Other symbolic dysfunctions: Secondary | ICD-10-CM | POA: Diagnosis not present

## 2020-12-25 DIAGNOSIS — M6281 Muscle weakness (generalized): Secondary | ICD-10-CM | POA: Diagnosis not present

## 2020-12-25 DIAGNOSIS — M48061 Spinal stenosis, lumbar region without neurogenic claudication: Secondary | ICD-10-CM | POA: Diagnosis not present

## 2020-12-26 DIAGNOSIS — M48061 Spinal stenosis, lumbar region without neurogenic claudication: Secondary | ICD-10-CM | POA: Diagnosis not present

## 2020-12-26 DIAGNOSIS — R488 Other symbolic dysfunctions: Secondary | ICD-10-CM | POA: Diagnosis not present

## 2020-12-26 DIAGNOSIS — I639 Cerebral infarction, unspecified: Secondary | ICD-10-CM | POA: Diagnosis not present

## 2020-12-26 DIAGNOSIS — I739 Peripheral vascular disease, unspecified: Secondary | ICD-10-CM | POA: Diagnosis not present

## 2020-12-26 DIAGNOSIS — R278 Other lack of coordination: Secondary | ICD-10-CM | POA: Diagnosis not present

## 2020-12-26 DIAGNOSIS — I63432 Cerebral infarction due to embolism of left posterior cerebral artery: Secondary | ICD-10-CM | POA: Diagnosis not present

## 2020-12-26 DIAGNOSIS — I69351 Hemiplegia and hemiparesis following cerebral infarction affecting right dominant side: Secondary | ICD-10-CM | POA: Diagnosis not present

## 2020-12-26 DIAGNOSIS — M6281 Muscle weakness (generalized): Secondary | ICD-10-CM | POA: Diagnosis not present

## 2020-12-26 DIAGNOSIS — R2681 Unsteadiness on feet: Secondary | ICD-10-CM | POA: Diagnosis not present

## 2020-12-27 DIAGNOSIS — I63432 Cerebral infarction due to embolism of left posterior cerebral artery: Secondary | ICD-10-CM | POA: Diagnosis not present

## 2020-12-27 DIAGNOSIS — I69351 Hemiplegia and hemiparesis following cerebral infarction affecting right dominant side: Secondary | ICD-10-CM | POA: Diagnosis not present

## 2020-12-27 DIAGNOSIS — R2681 Unsteadiness on feet: Secondary | ICD-10-CM | POA: Diagnosis not present

## 2020-12-27 DIAGNOSIS — I639 Cerebral infarction, unspecified: Secondary | ICD-10-CM | POA: Diagnosis not present

## 2020-12-27 DIAGNOSIS — M48061 Spinal stenosis, lumbar region without neurogenic claudication: Secondary | ICD-10-CM | POA: Diagnosis not present

## 2020-12-27 DIAGNOSIS — M6281 Muscle weakness (generalized): Secondary | ICD-10-CM | POA: Diagnosis not present

## 2020-12-27 DIAGNOSIS — I739 Peripheral vascular disease, unspecified: Secondary | ICD-10-CM | POA: Diagnosis not present

## 2020-12-27 DIAGNOSIS — R488 Other symbolic dysfunctions: Secondary | ICD-10-CM | POA: Diagnosis not present

## 2020-12-27 DIAGNOSIS — R278 Other lack of coordination: Secondary | ICD-10-CM | POA: Diagnosis not present

## 2020-12-30 DIAGNOSIS — R278 Other lack of coordination: Secondary | ICD-10-CM | POA: Diagnosis not present

## 2020-12-30 DIAGNOSIS — M6281 Muscle weakness (generalized): Secondary | ICD-10-CM | POA: Diagnosis not present

## 2020-12-30 DIAGNOSIS — I69351 Hemiplegia and hemiparesis following cerebral infarction affecting right dominant side: Secondary | ICD-10-CM | POA: Diagnosis not present

## 2020-12-30 DIAGNOSIS — M48061 Spinal stenosis, lumbar region without neurogenic claudication: Secondary | ICD-10-CM | POA: Diagnosis not present

## 2020-12-30 DIAGNOSIS — I639 Cerebral infarction, unspecified: Secondary | ICD-10-CM | POA: Diagnosis not present

## 2020-12-30 DIAGNOSIS — I63432 Cerebral infarction due to embolism of left posterior cerebral artery: Secondary | ICD-10-CM | POA: Diagnosis not present

## 2020-12-30 DIAGNOSIS — R488 Other symbolic dysfunctions: Secondary | ICD-10-CM | POA: Diagnosis not present

## 2020-12-30 DIAGNOSIS — R2681 Unsteadiness on feet: Secondary | ICD-10-CM | POA: Diagnosis not present

## 2020-12-30 DIAGNOSIS — I739 Peripheral vascular disease, unspecified: Secondary | ICD-10-CM | POA: Diagnosis not present

## 2020-12-31 DIAGNOSIS — I639 Cerebral infarction, unspecified: Secondary | ICD-10-CM | POA: Diagnosis not present

## 2020-12-31 DIAGNOSIS — M6281 Muscle weakness (generalized): Secondary | ICD-10-CM | POA: Diagnosis not present

## 2020-12-31 DIAGNOSIS — I69351 Hemiplegia and hemiparesis following cerebral infarction affecting right dominant side: Secondary | ICD-10-CM | POA: Diagnosis not present

## 2020-12-31 DIAGNOSIS — R2681 Unsteadiness on feet: Secondary | ICD-10-CM | POA: Diagnosis not present

## 2020-12-31 DIAGNOSIS — I739 Peripheral vascular disease, unspecified: Secondary | ICD-10-CM | POA: Diagnosis not present

## 2020-12-31 DIAGNOSIS — R278 Other lack of coordination: Secondary | ICD-10-CM | POA: Diagnosis not present

## 2020-12-31 DIAGNOSIS — R488 Other symbolic dysfunctions: Secondary | ICD-10-CM | POA: Diagnosis not present

## 2020-12-31 DIAGNOSIS — I63432 Cerebral infarction due to embolism of left posterior cerebral artery: Secondary | ICD-10-CM | POA: Diagnosis not present

## 2020-12-31 DIAGNOSIS — M48061 Spinal stenosis, lumbar region without neurogenic claudication: Secondary | ICD-10-CM | POA: Diagnosis not present

## 2021-01-01 DIAGNOSIS — R278 Other lack of coordination: Secondary | ICD-10-CM | POA: Diagnosis not present

## 2021-01-01 DIAGNOSIS — M6281 Muscle weakness (generalized): Secondary | ICD-10-CM | POA: Diagnosis not present

## 2021-01-01 DIAGNOSIS — I69351 Hemiplegia and hemiparesis following cerebral infarction affecting right dominant side: Secondary | ICD-10-CM | POA: Diagnosis not present

## 2021-01-01 DIAGNOSIS — M48061 Spinal stenosis, lumbar region without neurogenic claudication: Secondary | ICD-10-CM | POA: Diagnosis not present

## 2021-01-01 DIAGNOSIS — R2681 Unsteadiness on feet: Secondary | ICD-10-CM | POA: Diagnosis not present

## 2021-01-01 DIAGNOSIS — I639 Cerebral infarction, unspecified: Secondary | ICD-10-CM | POA: Diagnosis not present

## 2021-01-01 DIAGNOSIS — I63432 Cerebral infarction due to embolism of left posterior cerebral artery: Secondary | ICD-10-CM | POA: Diagnosis not present

## 2021-01-01 DIAGNOSIS — I739 Peripheral vascular disease, unspecified: Secondary | ICD-10-CM | POA: Diagnosis not present

## 2021-01-01 DIAGNOSIS — R488 Other symbolic dysfunctions: Secondary | ICD-10-CM | POA: Diagnosis not present

## 2021-01-02 DIAGNOSIS — M6281 Muscle weakness (generalized): Secondary | ICD-10-CM | POA: Diagnosis not present

## 2021-01-02 DIAGNOSIS — M48061 Spinal stenosis, lumbar region without neurogenic claudication: Secondary | ICD-10-CM | POA: Diagnosis not present

## 2021-01-02 DIAGNOSIS — I639 Cerebral infarction, unspecified: Secondary | ICD-10-CM | POA: Diagnosis not present

## 2021-01-02 DIAGNOSIS — I63432 Cerebral infarction due to embolism of left posterior cerebral artery: Secondary | ICD-10-CM | POA: Diagnosis not present

## 2021-01-02 DIAGNOSIS — I69351 Hemiplegia and hemiparesis following cerebral infarction affecting right dominant side: Secondary | ICD-10-CM | POA: Diagnosis not present

## 2021-01-02 DIAGNOSIS — I739 Peripheral vascular disease, unspecified: Secondary | ICD-10-CM | POA: Diagnosis not present

## 2021-01-02 DIAGNOSIS — R488 Other symbolic dysfunctions: Secondary | ICD-10-CM | POA: Diagnosis not present

## 2021-01-02 DIAGNOSIS — R278 Other lack of coordination: Secondary | ICD-10-CM | POA: Diagnosis not present

## 2021-01-02 DIAGNOSIS — R2681 Unsteadiness on feet: Secondary | ICD-10-CM | POA: Diagnosis not present

## 2021-01-03 DIAGNOSIS — R488 Other symbolic dysfunctions: Secondary | ICD-10-CM | POA: Diagnosis not present

## 2021-01-03 DIAGNOSIS — R2681 Unsteadiness on feet: Secondary | ICD-10-CM | POA: Diagnosis not present

## 2021-01-03 DIAGNOSIS — I639 Cerebral infarction, unspecified: Secondary | ICD-10-CM | POA: Diagnosis not present

## 2021-01-03 DIAGNOSIS — I63432 Cerebral infarction due to embolism of left posterior cerebral artery: Secondary | ICD-10-CM | POA: Diagnosis not present

## 2021-01-03 DIAGNOSIS — I69351 Hemiplegia and hemiparesis following cerebral infarction affecting right dominant side: Secondary | ICD-10-CM | POA: Diagnosis not present

## 2021-01-03 DIAGNOSIS — M6281 Muscle weakness (generalized): Secondary | ICD-10-CM | POA: Diagnosis not present

## 2021-01-03 DIAGNOSIS — I739 Peripheral vascular disease, unspecified: Secondary | ICD-10-CM | POA: Diagnosis not present

## 2021-01-03 DIAGNOSIS — M48061 Spinal stenosis, lumbar region without neurogenic claudication: Secondary | ICD-10-CM | POA: Diagnosis not present

## 2021-01-03 DIAGNOSIS — R278 Other lack of coordination: Secondary | ICD-10-CM | POA: Diagnosis not present

## 2021-01-06 DIAGNOSIS — M48061 Spinal stenosis, lumbar region without neurogenic claudication: Secondary | ICD-10-CM | POA: Diagnosis not present

## 2021-01-06 DIAGNOSIS — I63432 Cerebral infarction due to embolism of left posterior cerebral artery: Secondary | ICD-10-CM | POA: Diagnosis not present

## 2021-01-06 DIAGNOSIS — I639 Cerebral infarction, unspecified: Secondary | ICD-10-CM | POA: Diagnosis not present

## 2021-01-06 DIAGNOSIS — R488 Other symbolic dysfunctions: Secondary | ICD-10-CM | POA: Diagnosis not present

## 2021-01-06 DIAGNOSIS — I69351 Hemiplegia and hemiparesis following cerebral infarction affecting right dominant side: Secondary | ICD-10-CM | POA: Diagnosis not present

## 2021-01-06 DIAGNOSIS — I739 Peripheral vascular disease, unspecified: Secondary | ICD-10-CM | POA: Diagnosis not present

## 2021-01-06 DIAGNOSIS — R2681 Unsteadiness on feet: Secondary | ICD-10-CM | POA: Diagnosis not present

## 2021-01-06 DIAGNOSIS — M6281 Muscle weakness (generalized): Secondary | ICD-10-CM | POA: Diagnosis not present

## 2021-01-06 DIAGNOSIS — R278 Other lack of coordination: Secondary | ICD-10-CM | POA: Diagnosis not present

## 2021-01-07 DIAGNOSIS — I69351 Hemiplegia and hemiparesis following cerebral infarction affecting right dominant side: Secondary | ICD-10-CM | POA: Diagnosis not present

## 2021-01-07 DIAGNOSIS — R2681 Unsteadiness on feet: Secondary | ICD-10-CM | POA: Diagnosis not present

## 2021-01-07 DIAGNOSIS — R278 Other lack of coordination: Secondary | ICD-10-CM | POA: Diagnosis not present

## 2021-01-07 DIAGNOSIS — M6281 Muscle weakness (generalized): Secondary | ICD-10-CM | POA: Diagnosis not present

## 2021-01-07 DIAGNOSIS — I639 Cerebral infarction, unspecified: Secondary | ICD-10-CM | POA: Diagnosis not present

## 2021-01-07 DIAGNOSIS — R488 Other symbolic dysfunctions: Secondary | ICD-10-CM | POA: Diagnosis not present

## 2021-01-07 DIAGNOSIS — I739 Peripheral vascular disease, unspecified: Secondary | ICD-10-CM | POA: Diagnosis not present

## 2021-01-07 DIAGNOSIS — M48061 Spinal stenosis, lumbar region without neurogenic claudication: Secondary | ICD-10-CM | POA: Diagnosis not present

## 2021-01-07 DIAGNOSIS — I63432 Cerebral infarction due to embolism of left posterior cerebral artery: Secondary | ICD-10-CM | POA: Diagnosis not present

## 2021-01-08 DIAGNOSIS — M6281 Muscle weakness (generalized): Secondary | ICD-10-CM | POA: Diagnosis not present

## 2021-01-08 DIAGNOSIS — R278 Other lack of coordination: Secondary | ICD-10-CM | POA: Diagnosis not present

## 2021-01-08 DIAGNOSIS — I739 Peripheral vascular disease, unspecified: Secondary | ICD-10-CM | POA: Diagnosis not present

## 2021-01-08 DIAGNOSIS — M48061 Spinal stenosis, lumbar region without neurogenic claudication: Secondary | ICD-10-CM | POA: Diagnosis not present

## 2021-01-08 DIAGNOSIS — I69351 Hemiplegia and hemiparesis following cerebral infarction affecting right dominant side: Secondary | ICD-10-CM | POA: Diagnosis not present

## 2021-01-08 DIAGNOSIS — R2681 Unsteadiness on feet: Secondary | ICD-10-CM | POA: Diagnosis not present

## 2021-01-08 DIAGNOSIS — R488 Other symbolic dysfunctions: Secondary | ICD-10-CM | POA: Diagnosis not present

## 2021-01-08 DIAGNOSIS — I639 Cerebral infarction, unspecified: Secondary | ICD-10-CM | POA: Diagnosis not present

## 2021-01-08 DIAGNOSIS — I63432 Cerebral infarction due to embolism of left posterior cerebral artery: Secondary | ICD-10-CM | POA: Diagnosis not present

## 2021-01-09 DIAGNOSIS — M6281 Muscle weakness (generalized): Secondary | ICD-10-CM | POA: Diagnosis not present

## 2021-01-09 DIAGNOSIS — I69351 Hemiplegia and hemiparesis following cerebral infarction affecting right dominant side: Secondary | ICD-10-CM | POA: Diagnosis not present

## 2021-01-09 DIAGNOSIS — R2681 Unsteadiness on feet: Secondary | ICD-10-CM | POA: Diagnosis not present

## 2021-01-09 DIAGNOSIS — I639 Cerebral infarction, unspecified: Secondary | ICD-10-CM | POA: Diagnosis not present

## 2021-01-09 DIAGNOSIS — R488 Other symbolic dysfunctions: Secondary | ICD-10-CM | POA: Diagnosis not present

## 2021-01-09 DIAGNOSIS — I63432 Cerebral infarction due to embolism of left posterior cerebral artery: Secondary | ICD-10-CM | POA: Diagnosis not present

## 2021-01-09 DIAGNOSIS — R278 Other lack of coordination: Secondary | ICD-10-CM | POA: Diagnosis not present

## 2021-01-09 DIAGNOSIS — I739 Peripheral vascular disease, unspecified: Secondary | ICD-10-CM | POA: Diagnosis not present

## 2021-01-09 DIAGNOSIS — M48061 Spinal stenosis, lumbar region without neurogenic claudication: Secondary | ICD-10-CM | POA: Diagnosis not present

## 2021-01-10 DIAGNOSIS — R2681 Unsteadiness on feet: Secondary | ICD-10-CM | POA: Diagnosis not present

## 2021-01-10 DIAGNOSIS — M48061 Spinal stenosis, lumbar region without neurogenic claudication: Secondary | ICD-10-CM | POA: Diagnosis not present

## 2021-01-10 DIAGNOSIS — I69351 Hemiplegia and hemiparesis following cerebral infarction affecting right dominant side: Secondary | ICD-10-CM | POA: Diagnosis not present

## 2021-01-10 DIAGNOSIS — I63432 Cerebral infarction due to embolism of left posterior cerebral artery: Secondary | ICD-10-CM | POA: Diagnosis not present

## 2021-01-10 DIAGNOSIS — I639 Cerebral infarction, unspecified: Secondary | ICD-10-CM | POA: Diagnosis not present

## 2021-01-10 DIAGNOSIS — R488 Other symbolic dysfunctions: Secondary | ICD-10-CM | POA: Diagnosis not present

## 2021-01-10 DIAGNOSIS — R278 Other lack of coordination: Secondary | ICD-10-CM | POA: Diagnosis not present

## 2021-01-10 DIAGNOSIS — I739 Peripheral vascular disease, unspecified: Secondary | ICD-10-CM | POA: Diagnosis not present

## 2021-01-10 DIAGNOSIS — M6281 Muscle weakness (generalized): Secondary | ICD-10-CM | POA: Diagnosis not present

## 2021-01-13 ENCOUNTER — Emergency Department (HOSPITAL_COMMUNITY): Payer: Medicare Other

## 2021-01-13 ENCOUNTER — Inpatient Hospital Stay (HOSPITAL_COMMUNITY): Payer: Medicare Other

## 2021-01-13 ENCOUNTER — Inpatient Hospital Stay (HOSPITAL_COMMUNITY)
Admission: EM | Admit: 2021-01-13 | Discharge: 2021-01-17 | DRG: 689 | Disposition: A | Discharge status: Skilled Nursing Facility | Payer: Medicare Other | Attending: Internal Medicine | Admitting: Internal Medicine

## 2021-01-13 DIAGNOSIS — Z66 Do not resuscitate: Secondary | ICD-10-CM | POA: Diagnosis present

## 2021-01-13 DIAGNOSIS — R279 Unspecified lack of coordination: Secondary | ICD-10-CM | POA: Diagnosis not present

## 2021-01-13 DIAGNOSIS — E039 Hypothyroidism, unspecified: Secondary | ICD-10-CM | POA: Diagnosis not present

## 2021-01-13 DIAGNOSIS — Z7902 Long term (current) use of antithrombotics/antiplatelets: Secondary | ICD-10-CM | POA: Diagnosis not present

## 2021-01-13 DIAGNOSIS — Z7901 Long term (current) use of anticoagulants: Secondary | ICD-10-CM

## 2021-01-13 DIAGNOSIS — I10 Essential (primary) hypertension: Secondary | ICD-10-CM | POA: Diagnosis present

## 2021-01-13 DIAGNOSIS — K449 Diaphragmatic hernia without obstruction or gangrene: Secondary | ICD-10-CM | POA: Diagnosis present

## 2021-01-13 DIAGNOSIS — Z20822 Contact with and (suspected) exposure to covid-19: Secondary | ICD-10-CM | POA: Diagnosis not present

## 2021-01-13 DIAGNOSIS — R278 Other lack of coordination: Secondary | ICD-10-CM | POA: Diagnosis not present

## 2021-01-13 DIAGNOSIS — I70209 Unspecified atherosclerosis of native arteries of extremities, unspecified extremity: Secondary | ICD-10-CM | POA: Diagnosis not present

## 2021-01-13 DIAGNOSIS — I69351 Hemiplegia and hemiparesis following cerebral infarction affecting right dominant side: Secondary | ICD-10-CM

## 2021-01-13 DIAGNOSIS — H905 Unspecified sensorineural hearing loss: Secondary | ICD-10-CM | POA: Diagnosis not present

## 2021-01-13 DIAGNOSIS — I639 Cerebral infarction, unspecified: Secondary | ICD-10-CM | POA: Diagnosis not present

## 2021-01-13 DIAGNOSIS — Z886 Allergy status to analgesic agent status: Secondary | ICD-10-CM | POA: Diagnosis not present

## 2021-01-13 DIAGNOSIS — E785 Hyperlipidemia, unspecified: Secondary | ICD-10-CM | POA: Diagnosis not present

## 2021-01-13 DIAGNOSIS — J9811 Atelectasis: Secondary | ICD-10-CM | POA: Diagnosis not present

## 2021-01-13 DIAGNOSIS — K219 Gastro-esophageal reflux disease without esophagitis: Secondary | ICD-10-CM | POA: Diagnosis not present

## 2021-01-13 DIAGNOSIS — R4182 Altered mental status, unspecified: Secondary | ICD-10-CM | POA: Diagnosis not present

## 2021-01-13 DIAGNOSIS — R2681 Unsteadiness on feet: Secondary | ICD-10-CM | POA: Diagnosis not present

## 2021-01-13 DIAGNOSIS — E440 Moderate protein-calorie malnutrition: Secondary | ICD-10-CM | POA: Diagnosis not present

## 2021-01-13 DIAGNOSIS — Z888 Allergy status to other drugs, medicaments and biological substances status: Secondary | ICD-10-CM | POA: Diagnosis not present

## 2021-01-13 DIAGNOSIS — I739 Peripheral vascular disease, unspecified: Secondary | ICD-10-CM | POA: Diagnosis not present

## 2021-01-13 DIAGNOSIS — I2510 Atherosclerotic heart disease of native coronary artery without angina pectoris: Secondary | ICD-10-CM | POA: Diagnosis present

## 2021-01-13 DIAGNOSIS — F0391 Unspecified dementia with behavioral disturbance: Secondary | ICD-10-CM | POA: Diagnosis present

## 2021-01-13 DIAGNOSIS — Z823 Family history of stroke: Secondary | ICD-10-CM

## 2021-01-13 DIAGNOSIS — H409 Unspecified glaucoma: Secondary | ICD-10-CM | POA: Diagnosis not present

## 2021-01-13 DIAGNOSIS — J90 Pleural effusion, not elsewhere classified: Secondary | ICD-10-CM | POA: Diagnosis not present

## 2021-01-13 DIAGNOSIS — R400 Somnolence: Secondary | ICD-10-CM | POA: Diagnosis not present

## 2021-01-13 DIAGNOSIS — G2581 Restless legs syndrome: Secondary | ICD-10-CM | POA: Diagnosis present

## 2021-01-13 DIAGNOSIS — Z79890 Hormone replacement therapy (postmenopausal): Secondary | ICD-10-CM

## 2021-01-13 DIAGNOSIS — R404 Transient alteration of awareness: Secondary | ICD-10-CM | POA: Diagnosis not present

## 2021-01-13 DIAGNOSIS — R1312 Dysphagia, oropharyngeal phase: Secondary | ICD-10-CM | POA: Diagnosis not present

## 2021-01-13 DIAGNOSIS — M48061 Spinal stenosis, lumbar region without neurogenic claudication: Secondary | ICD-10-CM | POA: Diagnosis not present

## 2021-01-13 DIAGNOSIS — R5383 Other fatigue: Secondary | ICD-10-CM | POA: Diagnosis not present

## 2021-01-13 DIAGNOSIS — I480 Paroxysmal atrial fibrillation: Secondary | ICD-10-CM | POA: Diagnosis present

## 2021-01-13 DIAGNOSIS — K311 Adult hypertrophic pyloric stenosis: Secondary | ICD-10-CM | POA: Diagnosis not present

## 2021-01-13 DIAGNOSIS — Z79899 Other long term (current) drug therapy: Secondary | ICD-10-CM | POA: Diagnosis not present

## 2021-01-13 DIAGNOSIS — N390 Urinary tract infection, site not specified: Principal | ICD-10-CM | POA: Diagnosis present

## 2021-01-13 DIAGNOSIS — E7800 Pure hypercholesterolemia, unspecified: Secondary | ICD-10-CM | POA: Diagnosis not present

## 2021-01-13 DIAGNOSIS — Z8249 Family history of ischemic heart disease and other diseases of the circulatory system: Secondary | ICD-10-CM | POA: Diagnosis not present

## 2021-01-13 DIAGNOSIS — G9341 Metabolic encephalopathy: Secondary | ICD-10-CM | POA: Diagnosis present

## 2021-01-13 DIAGNOSIS — Z741 Need for assistance with personal care: Secondary | ICD-10-CM | POA: Diagnosis not present

## 2021-01-13 DIAGNOSIS — Z743 Need for continuous supervision: Secondary | ICD-10-CM | POA: Diagnosis not present

## 2021-01-13 DIAGNOSIS — Z880 Allergy status to penicillin: Secondary | ICD-10-CM

## 2021-01-13 DIAGNOSIS — M6281 Muscle weakness (generalized): Secondary | ICD-10-CM | POA: Diagnosis not present

## 2021-01-13 DIAGNOSIS — R5381 Other malaise: Secondary | ICD-10-CM | POA: Diagnosis not present

## 2021-01-13 DIAGNOSIS — R0602 Shortness of breath: Secondary | ICD-10-CM | POA: Diagnosis not present

## 2021-01-13 LAB — COMPREHENSIVE METABOLIC PANEL
ALT: 29 U/L (ref 0–44)
AST: 32 U/L (ref 15–41)
Albumin: 3.5 g/dL (ref 3.5–5.0)
Alkaline Phosphatase: 74 U/L (ref 38–126)
Anion gap: 9 (ref 5–15)
BUN: 22 mg/dL (ref 8–23)
CO2: 23 mmol/L (ref 22–32)
Calcium: 9.6 mg/dL (ref 8.9–10.3)
Chloride: 104 mmol/L (ref 98–111)
Creatinine, Ser: 1.16 mg/dL — ABNORMAL HIGH (ref 0.44–1.00)
GFR, Estimated: 46 mL/min — ABNORMAL LOW (ref >60)
Glucose, Bld: 107 mg/dL — ABNORMAL HIGH (ref 70–99)
Potassium: 4.2 mmol/L (ref 3.5–5.1)
Sodium: 136 mmol/L (ref 135–145)
Total Bilirubin: 0.6 mg/dL (ref 0.3–1.2)
Total Protein: 6.7 g/dL (ref 6.5–8.1)

## 2021-01-13 LAB — URINALYSIS, COMPLETE (UACMP) WITH MICROSCOPIC
Bilirubin Urine: NEGATIVE
Glucose, UA: NEGATIVE mg/dL
Hgb urine dipstick: NEGATIVE
Ketones, ur: NEGATIVE mg/dL
Nitrite: NEGATIVE
Protein, ur: 30 mg/dL — AB
Specific Gravity, Urine: 1.025 (ref 1.005–1.030)
WBC, UA: >50 WBC/hpf — ABNORMAL HIGH (ref 0–5)
pH: 5 (ref 5.0–8.0)

## 2021-01-13 LAB — I-STAT VENOUS BLOOD GAS, ED
Acid-Base Excess: 4 mmol/L — ABNORMAL HIGH (ref 0.0–2.0)
Bicarbonate: 28.2 mmol/L — ABNORMAL HIGH (ref 20.0–28.0)
Calcium, Ion: 1.12 mmol/L — ABNORMAL LOW (ref 1.15–1.40)
HCT: 45 % (ref 36.0–46.0)
Hemoglobin: 15.3 g/dL — ABNORMAL HIGH (ref 12.0–15.0)
O2 Saturation: 76 %
Potassium: 5.4 mmol/L — ABNORMAL HIGH (ref 3.5–5.1)
Sodium: 138 mmol/L (ref 135–145)
TCO2: 29 mmol/L (ref 22–32)
pCO2, Ven: 38 mmHg — ABNORMAL LOW (ref 44.0–60.0)
pH, Ven: 7.478 — ABNORMAL HIGH (ref 7.250–7.430)
pO2, Ven: 38 mmHg (ref 32.0–45.0)

## 2021-01-13 LAB — RAPID URINE DRUG SCREEN, HOSP PERFORMED
Amphetamines: NOT DETECTED
Barbiturates: NOT DETECTED
Benzodiazepines: NOT DETECTED
Cocaine: NOT DETECTED
Opiates: NOT DETECTED
Tetrahydrocannabinol: NOT DETECTED

## 2021-01-13 LAB — CBC WITH DIFFERENTIAL/PLATELET
Abs Immature Granulocytes: 0.04 10*3/uL (ref 0.00–0.07)
Basophils Absolute: 0 10*3/uL (ref 0.0–0.1)
Basophils Relative: 0 %
Eosinophils Absolute: 0.1 10*3/uL (ref 0.0–0.5)
Eosinophils Relative: 1 %
HCT: 46.3 % — ABNORMAL HIGH (ref 36.0–46.0)
Hemoglobin: 14.9 g/dL (ref 12.0–15.0)
Immature Granulocytes: 0 %
Lymphocytes Relative: 27 %
Lymphs Abs: 2.6 10*3/uL (ref 0.7–4.0)
MCH: 30.2 pg (ref 26.0–34.0)
MCHC: 32.2 g/dL (ref 30.0–36.0)
MCV: 93.7 fL (ref 80.0–100.0)
Monocytes Absolute: 0.9 10*3/uL (ref 0.1–1.0)
Monocytes Relative: 9 %
Neutro Abs: 5.8 10*3/uL (ref 1.7–7.7)
Neutrophils Relative %: 63 %
Platelets: 224 10*3/uL (ref 150–400)
RBC: 4.94 MIL/uL (ref 3.87–5.11)
RDW: 13.2 % (ref 11.5–15.5)
WBC: 9.4 10*3/uL (ref 4.0–10.5)
nRBC: 0 % (ref 0.0–0.2)

## 2021-01-13 LAB — ETHANOL: Alcohol, Ethyl (B): <10 mg/dL (ref <10)

## 2021-01-13 LAB — RESP PANEL BY RT-PCR (FLU A&B, COVID) ARPGX2
Influenza A by PCR: NEGATIVE
Influenza B by PCR: NEGATIVE
SARS Coronavirus 2 by RT PCR: NEGATIVE

## 2021-01-13 LAB — CBG MONITORING, ED: Glucose-Capillary: 92 mg/dL (ref 70–99)

## 2021-01-13 LAB — AMMONIA: Ammonia: 28 umol/L (ref 9–35)

## 2021-01-13 LAB — LACTIC ACID, PLASMA: Lactic Acid, Venous: 1.9 mmol/L (ref 0.5–1.9)

## 2021-01-13 MED ORDER — ENOXAPARIN SODIUM 30 MG/0.3ML ~~LOC~~ SOLN
30.0000 mg | SUBCUTANEOUS | Status: DC
Start: 2021-01-13 — End: 2021-01-13

## 2021-01-13 MED ORDER — SODIUM CHLORIDE 0.9 % IV SOLN: INTRAVENOUS | Status: AC

## 2021-01-13 MED ORDER — HALOPERIDOL LACTATE 5 MG/ML IJ SOLN: 1.0000 mg | Freq: Four times a day (QID) | INTRAMUSCULAR | Status: AC | PRN

## 2021-01-13 MED ORDER — SENNOSIDES-DOCUSATE SODIUM 8.6-50 MG PO TABS: 1.0000 | ORAL_TABLET | Freq: Every evening | ORAL | Status: DC | PRN

## 2021-01-13 MED ORDER — AMLODIPINE BESYLATE 5 MG PO TABS: 10.0000 mg | ORAL_TABLET | Freq: Every day | ORAL | Status: DC

## 2021-01-13 MED ORDER — LATANOPROST 0.005 % OP SOLN
1.0000 [drp] | Freq: Every day | OPHTHALMIC | Status: DC
  Administered 2021-01-14 – 2021-01-16 (×3): 1 [drp] via OPHTHALMIC
  Filled 2021-01-13: qty 2.5

## 2021-01-13 MED ORDER — CARIPRAZINE HCL 3 MG PO CAPS: 3.0000 mg | ORAL_CAPSULE | Freq: Every day | ORAL | Status: DC

## 2021-01-13 MED ORDER — APIXABAN 5 MG PO TABS
5.0000 mg | ORAL_TABLET | Freq: Two times a day (BID) | ORAL | Status: DC
Start: 2021-01-13 — End: 2021-01-15
  Administered 2021-01-14 – 2021-01-15 (×3): 5 mg via ORAL
  Filled 2021-01-13 (×3): qty 1

## 2021-01-13 MED ORDER — ACETAMINOPHEN 325 MG PO TABS
650.0000 mg | ORAL_TABLET | Freq: Four times a day (QID) | ORAL | Status: DC | PRN
Start: 2021-01-13 — End: 2021-01-18

## 2021-01-13 MED ORDER — ESCITALOPRAM OXALATE 20 MG PO TABS
20.0000 mg | ORAL_TABLET | Freq: Every day | ORAL | Status: DC
  Administered 2021-01-14 – 2021-01-17 (×4): 20 mg via ORAL
  Filled 2021-01-13 (×4): qty 1

## 2021-01-13 MED ORDER — HALOPERIDOL LACTATE 5 MG/ML IJ SOLN: 1.0000 mg | Freq: Four times a day (QID) | INTRAMUSCULAR | Status: DC | PRN

## 2021-01-13 MED ORDER — CARVEDILOL 3.125 MG PO TABS
3.1250 mg | ORAL_TABLET | Freq: Two times a day (BID) | ORAL | Status: DC
  Administered 2021-01-14 – 2021-01-17 (×7): 3.125 mg via ORAL
  Filled 2021-01-13 (×9): qty 1

## 2021-01-13 MED ORDER — SODIUM CHLORIDE 0.9 % IV SOLN
1.0000 g | INTRAVENOUS | Status: DC
  Administered 2021-01-14 – 2021-01-16 (×3): 1 g via INTRAVENOUS
  Filled 2021-01-13: qty 10
  Filled 2021-01-13: qty 1
  Filled 2021-01-13 (×2): qty 10

## 2021-01-13 MED ORDER — DIVALPROEX SODIUM 125 MG PO CSDR: 125.0000 mg | DELAYED_RELEASE_CAPSULE | ORAL | Status: DC

## 2021-01-13 MED ORDER — EZETIMIBE 10 MG PO TABS: 10.0000 mg | ORAL_TABLET | Freq: Every day | ORAL | Status: DC

## 2021-01-13 MED ORDER — LORAZEPAM 0.5 MG PO TABS
0.5000 mg | ORAL_TABLET | Freq: Two times a day (BID) | ORAL | Status: DC | PRN
  Administered 2021-01-14 – 2021-01-15 (×2): 0.5 mg via ORAL
  Filled 2021-01-13 (×2): qty 1

## 2021-01-13 MED ORDER — ZINC OXIDE 12.8 % EX OINT
TOPICAL_OINTMENT | CUTANEOUS | Status: DC | PRN
  Filled 2021-01-13: qty 56.7

## 2021-01-13 MED ORDER — LEVOTHYROXINE SODIUM 100 MCG PO TABS
100.0000 ug | ORAL_TABLET | Freq: Every day | ORAL | Status: DC
  Administered 2021-01-14 – 2021-01-17 (×4): 100 ug via ORAL
  Filled 2021-01-13 (×4): qty 1

## 2021-01-13 MED ORDER — EZETIMIBE 10 MG PO TABS
10.0000 mg | ORAL_TABLET | Freq: Every day | ORAL | Status: DC
  Administered 2021-01-14 – 2021-01-17 (×4): 10 mg via ORAL
  Filled 2021-01-13 (×6): qty 1

## 2021-01-13 MED ORDER — CLOPIDOGREL BISULFATE 75 MG PO TABS: 75.0000 mg | ORAL_TABLET | Freq: Every day | ORAL | Status: DC

## 2021-01-13 MED ORDER — CARIPRAZINE HCL 1.5 MG PO CAPS
3.0000 mg | ORAL_CAPSULE | Freq: Every day | ORAL | Status: DC
  Administered 2021-01-14 – 2021-01-17 (×4): 3 mg via ORAL
  Filled 2021-01-13 (×5): qty 2
  Filled 2021-01-13: qty 1

## 2021-01-13 MED ORDER — TRAMADOL HCL 50 MG PO TABS
50.0000 mg | ORAL_TABLET | Freq: Two times a day (BID) | ORAL | Status: DC | PRN
  Filled 2021-01-13: qty 2

## 2021-01-13 MED ORDER — SODIUM CHLORIDE 0.9 % IV SOLN
1.0000 g | Freq: Once | INTRAVENOUS | Status: AC
  Administered 2021-01-13: 1 g via INTRAVENOUS
  Filled 2021-01-13: qty 10

## 2021-01-13 MED ORDER — PANTOPRAZOLE SODIUM 40 MG PO TBEC
40.0000 mg | DELAYED_RELEASE_TABLET | Freq: Every day | ORAL | Status: DC
  Administered 2021-01-14 – 2021-01-17 (×4): 40 mg via ORAL
  Filled 2021-01-13 (×4): qty 1

## 2021-01-13 NOTE — ED Triage Notes (Signed)
Paatient BIB EMS from [facility], active DNR documentation present. Reported from SNF decreased LOC, no longer verbal, and low grade fever of 99.7 temporal. LKW 1900 estimated. Family came to visit today and noted she would not interact and was more lethargic than baseline. Normal baseline knows son and self. GCS 8 here.

## 2021-01-13 NOTE — H&P (Signed)
History and Physical    Alyssa Odonnell HYW:[id number] DOB: 1934-03-16 DOA: 01/13/2021  PCP: Jenel Lucks, PA-C (Confirm with patient/family/NH records and if not entered, this has to be entered at [facility] point of entry) Patient coming from: SNF  I have personally briefly reviewed patient's old medical records in [place]  Chief Complaint: AMS  HPI: Alyssa Odonnell is a 87 y.o. female with medical history significant of recent CVA with residual right-sided weakness, advanced dementia, A. fib on Eliquis, HTN, presented with mental status changes.  Patient unable to provide any history, most of history provided by patient son at bedside.  Son reported baseline patient recognized family members and nursing. Son is not sure about whether patient has sundowning but reported patient does have episodes of confusion and agitation at baseline. This morning nursing home staff found patient became unresponsive with a low-grade fever 99.7.  Son also reported that at baseline, patient unable to take her herself, and has had frequent agitation/behavioral disturbance.  She has been on as needed benzos for it.  ED Course: CT head negative for acute finding.  UA showed leukocytes 21-50. WBC 9.4.  Review of Systems: As per HPI otherwise 14 point review of systems negative.    Past Medical History:  Diagnosis Date  . Acid reflux   . Acquired hammer toe   . Adenomatous colon polyp   . Alopecia   . Arthralgia of pelvic region and thigh   . Bunion   . Callus   . Cataract   . Gastric outlet obstruction 08/2019  . Glaucoma   . H/O acute bronchitis   . Hearing loss, sensorineural   . Hx of anaphylactic shock   . Hyperlipidemia   . Hypertension   . Hypothyroid   . Impingement syndrome of shoulder   . Incisional hernia, incarcerated   . Migraine headache   . Non-neoplastic nevus   . Osteoarthritis, knee   . PAF (paroxysmal atrial fibrillation) ([place])   . Plantar fat pad atrophy   .  RLS (restless legs syndrome)   . Stroke ([place])   . Tinea corporis   . UTI (urinary tract infection)   . Vitamin D deficiency     Past Surgical History:  Procedure Laterality Date  . ABDOMINAL AORTOGRAM W/LOWER EXTREMITY Right 09/11/2019   Procedure: ABDOMINAL AORTOGRAM W/LOWER EXTREMITY;  Surgeon: Waynetta Sandy, MD;  Location: [place] [facility];  Service: Cardiovascular;  Laterality: Right;  . ABDOMINAL HERNIA REPAIR    . CHOLECYSTECTOMY    . COLONOSCOPY    . GASTROSTOMY N/A 09/01/2019   Procedure: INSERTION OF GASTROSTOMY TUBE;  Surgeon: Erroll Luna, MD;  Location: [place];  Service: General;  Laterality: N/A;  . hernia repair w/ mesh    . HIATAL HERNIA REPAIR    . LAPAROSCOPIC LYSIS OF ADHESIONS  09/01/2019   Procedure: Laparoscopic Lysis Of Adhesions;  Surgeon: Erroll Luna, MD;  Location: [place];  Service: General;;  . LAPAROSCOPIC NISSEN FUNDOPLICATION N/A 48/54/6270   Procedure: LAPAROSCOPIC, POSSIBLE OPEN, GASTROPEXY;  Surgeon: Erroll Luna, MD;  Location: [place];  Service: General;  Laterality: N/A;  . LOOP RECORDER INSERTION N/A 03/11/2017   Procedure: Loop Recorder Insertion;  Surgeon: Evans Lance, MD;  Location: [place] [facility];  Service: Cardiovascular;  Laterality: N/A;  . NEUROPLASTY / TRANSPOSITION MEDIAN NERVE AT CARPAL TUNNEL    . PERIPHERAL VASCULAR BALLOON ANGIOPLASTY Left 09/11/2019   Procedure: PERIPHERAL VASCULAR BALLOON ANGIOPLASTY;  Surgeon: Waynetta Sandy,  MD;  Location: [place] [facility];  Service: Cardiovascular;  Laterality: Left;  TP trunk  . PERIPHERAL VASCULAR CATHETERIZATION Right 07/06/2016   Procedure: Lower Extremity Angiography;  Surgeon: Angelia Mould, MD;  Location: [place] [facility];  Service: Cardiovascular;  Laterality: Right;  . PERIPHERAL VASCULAR CATHETERIZATION Right 07/06/2016   Procedure: Peripheral Vascular Intervention;  Surgeon: Angelia Mould, MD;  Location: [place] [facility];  Service:  Cardiovascular;  Laterality: Right;  Superficial femoral  . PERIPHERAL VASCULAR INTERVENTION Left 09/11/2019   Procedure: PERIPHERAL VASCULAR INTERVENTION;  Surgeon: Waynetta Sandy, MD;  Location: [place] [facility];  Service: Cardiovascular;  Laterality: Left;  SFA  . skin lesion excision cheek/face    . TEE WITHOUT CARDIOVERSION N/A 03/11/2017   Procedure: TRANSESOPHAGEAL ECHOCARDIOGRAM (TEE);  Surgeon: Acie Fredrickson Wonda Cheng, MD;  Location: [facility];  Service: Cardiovascular;  Laterality: N/A;  . TOTAL HIP ARTHROPLASTY    . TUBAL LIGATION       reports that she has never smoked. She has never used smokeless tobacco. She reports that she does not drink alcohol and does not use drugs.  Allergies  Allergen Reactions  . Aspirin Other (See Comments)    Red splotches  . Atorvastatin Other (See Comments)    Muscle spams  . Nabumetone Itching  . Nsaids Other (See Comments)    Unknown reaction  . Pravastatin Other (See Comments)    Stiff,  walking difficulty   . Procaine Swelling  . Rosuvastatin Nausea Only and Other (See Comments)    Reflux and Muscle stiffness  . Simvastatin Nausea Only and Other (See Comments)    Reflux and Muscle stiffness  . Statins Other (See Comments)    Difficulty walking, muscle spasms, reflux, muscle stiffness  . Evolocumab Rash  . Mobic [Meloxicam] Rash  . Penicillins Rash    Has patient had a PCN reaction causing immediate rash, facial/tongue/throat swelling, SOB or lightheadedness with hypotension: Yes Has patient had a PCN reaction causing severe rash involving mucus membranes or skin necrosis: No Has patient had a PCN reaction that required hospitalization: No Has patient had a PCN reaction occurring within the last 10 years: No If all of the above answers are "NO", then may proceed with Cephalosporin use.      Family History  Problem Relation Age of Onset  . Osteoporosis Mother   . Hypertension Mother   . Heart failure Mother   .  Stroke Father   . Heart disease Father   . Heart attack Father   . Cataracts Brother   . Colon cancer Paternal Aunt   . Glaucoma Paternal Aunt   . Dementia Neg Hx   . Alzheimer's disease Neg Hx      Prior to Admission medications   Medication Sig Start Date End Date Taking? Authorizing Provider  acetaminophen (TYLENOL) 325 MG tablet Take 650 mg by mouth every 6 (six) hours as needed (pain).    [provider]  amLODipine (NORVASC) 10 MG tablet Take 10 mg by mouth daily. 10/13/20   [provider]  carvedilol (COREG) 3.125 MG tablet Take 3.125 mg by mouth 2 (two) times daily. 10/14/20   [provider]  clopidogrel (PLAVIX) 75 MG tablet Take 75 mg by mouth daily.    [provider]  divalproex (DEPAKOTE SPRINKLE) 125 MG capsule Take 125-250 mg by mouth See admin instructions. 1 capsule in the morning and 2 capsules at bedtime.    [provider]  AYTKZSW 1  MG TABS tablet TAKE 1 TABLET BY MOUTH TWO  TIMES DAILY Patient taking differently: Take 5 mg by mouth 2 (two) times daily. 03/14/19   Evans Lance, MD  escitalopram (LEXAPRO) 20 MG tablet Take 20 mg by mouth daily.    [provider]  ezetimibe (ZETIA) 10 MG tablet Take 1 tablet (10 mg total) by mouth daily. 10/24/20   Swayze, Ava, DO  latanoprost (XALATAN) 0.005 % ophthalmic solution Place 1 drop into both eyes at bedtime. 09/09/20   [provider]  levothyroxine (SYNTHROID, LEVOTHROID) 100 MCG tablet Take 100 mcg by mouth daily before breakfast.  05/11/16   [provider]  LORazepam (ATIVAN) 0.5 MG tablet Take 1 tablet (0.5 mg total) by mouth 2 (two) times daily as needed for anxiety. 10/23/20   Swayze, Ava, DO  Multiple Vitamins-Minerals (CERTA-VITE PO) Take 1 tablet by mouth daily.    [provider]  pantoprazole (PROTONIX) 40 MG tablet Take 40 mg by mouth daily.    [provider]  senna-docusate (SENOKOT-S) 8.6-50 MG tablet Take 1 tablet by  mouth at bedtime as needed for mild constipation. 10/23/20   Swayze, Ava, DO  traMADol (ULTRAM) 50 MG tablet Take 1-2 tablets (50-100 mg total) by mouth every 6 (six) hours as needed for moderate pain or severe pain. 10/23/20   Swayze, Ava, DO  VRAYLAR capsule Take 3 mg by mouth daily. 10/09/20   [provider]    Physical Exam: Vitals:   01/13/21 1645 01/13/21 1700 01/13/21 1715 01/13/21 1815  BP: 110/70 121/70 130/72 (!) 113/59  Pulse: 82 81 93 78  Resp: 17 13 19 19   Temp:      TempSrc:      SpO2: 97% 97% 98% 96%    Constitutional: NAD, calm, comfortable Vitals:   01/13/21 1645 01/13/21 1700 01/13/21 1715 01/13/21 1815  BP: 110/70 121/70 130/72 (!) 113/59  Pulse: 82 81 93 78  Resp: 17 13 19 19   Temp:      TempSrc:      SpO2: 97% 97% 98% 96%   Eyes: PERRL, lids and conjunctivae normal ENMT: Mucous membranes are dry. Posterior pharynx clear of any exudate or lesions.Normal dentition.  Neck: normal, supple, no masses, no thyromegaly Respiratory: clear to auscultation bilaterally, no wheezing, crackles on B/L bases Right>Left. Normal respiratory effort. No accessory muscle use.  Cardiovascular: Regular rate and rhythm, no murmurs / rubs / gallops. No extremity edema. 2+ pedal pulses. No carotid bruits.  Abdomen: no tenderness, no masses palpated. No hepatosplenomegaly. Bowel sounds positive.  Musculoskeletal: no clubbing / cyanosis. No joint deformity upper and lower extremities. Good ROM, no contractures. Normal muscle tone.  Skin: no rashes, lesions, ulcers. No induration Neurologic: Arousable, moving limbs Psychiatric: Arousable  Labs on Admission: I have personally reviewed following labs and imaging studies  CBC: Recent Labs  Lab 01/13/21 1542 01/13/21 1547  WBC 9.4  --   NEUTROABS 5.8  --   HGB 14.9 15.3*  HCT 46.3* 45.0  MCV 93.7  --   PLT 224  --    Basic Metabolic Panel: Recent Labs  Lab 01/13/21 1542 01/13/21 1547  NA 136 138  K 4.2 5.4*  CL 104   --   CO2 23  --   GLUCOSE 107*  --   BUN 22  --   CREATININE 1.16*  --   CALCIUM 9.6  --    GFR: CrCl cannot be calculated (Unknown ideal weight.). Liver Function Tests: Recent Labs  Lab 01/13/21 1542  AST 32  ALT 29  ALKPHOS 74  BILITOT 0.6  PROT 6.7  ALBUMIN 3.5   No results for input(s): LIPASE, AMYLASE in the last 168 hours. No results for input(s): AMMONIA in the last 168 hours. Coagulation Profile: No results for input(s): INR, PROTIME in the last 168 hours. Cardiac Enzymes: No results for input(s): CKTOTAL, CKMB, CKMBINDEX, TROPONINI in the last 168 hours. BNP (last 3 results) No results for input(s): PROBNP in the last 8760 hours. HbA1C: No results for input(s): HGBA1C in the last 72 hours. CBG: Recent Labs  Lab 01/13/21 1713  GLUCAP 92   Lipid Profile: No results for input(s): CHOL, HDL, LDLCALC, TRIG, CHOLHDL, LDLDIRECT in the last 72 hours. Thyroid Function Tests: No results for input(s): TSH, T4TOTAL, FREET4, T3FREE, THYROIDAB in the last 72 hours. Anemia Panel: No results for input(s): VITAMINB12, FOLATE, FERRITIN, TIBC, IRON, RETICCTPCT in the last 72 hours. Urine analysis:    Component Value Date/Time   COLORURINE AMBER (A) 01/13/2021 1542   APPEARANCEUR CLOUDY (A) 01/13/2021 1542   LABSPEC 1.025 01/13/2021 1542   PHURINE 5.0 01/13/2021 1542   GLUCOSEU NEGATIVE 01/13/2021 1542   HGBUR NEGATIVE 01/13/2021 1542   BILIRUBINUR NEGATIVE 01/13/2021 1542   KETONESUR NEGATIVE 01/13/2021 1542   PROTEINUR 30 (A) 01/13/2021 1542   NITRITE NEGATIVE 01/13/2021 1542   LEUKOCYTESUR LARGE (A) 01/13/2021 1542    Radiological Exams on Admission: CT HEAD WO CONTRAST  Result Date: 01/13/2021 CLINICAL DATA:  Mental status change, unknown cause EXAM: CT HEAD WITHOUT CONTRAST TECHNIQUE: Contiguous axial images were obtained from the base of the skull through the vertex without intravenous contrast. COMPARISON:  Head CT 12/03/2020 FINDINGS: Brain: No hemorrhage.  Stable degree of atrophy. Moderate to advanced chronic small vessel ischemia. Remote left occipital infarct. Remote bilateral basal gangliar lacunar infarcts. No evidence of acute ischemia. No subdural or extra-axial collection. No midline shift or mass effect. Vascular: Atherosclerosis of skullbase vasculature without hyperdense vessel or abnormal calcification. Skull: No fracture or focal lesion. Sinuses/Orbits: Right maxillary sinus mucosal thickening partially included. No acute findings. Other: None. IMPRESSION: 1. No acute intracranial abnormality. 2. Stable atrophy, chronic small vessel ischemia, and remote infarcts. Electronically Signed   By: Keith Rake M.D.   On: 01/13/2021 18:14   DG Chest Port 1 View  Result Date: 01/13/2021 CLINICAL DATA:  Altered mental status and shortness of breath. EXAM: PORTABLE CHEST 1 VIEW COMPARISON:  10/15/2020 FINDINGS: Rotated exam. Large hiatal hernia partially distorts mediastinal contours. This is grossly unchanged allowing for differences in rotation. Planted loop recorder projects over the left chest wall. Suspected small pleural effusions, right greater than left. Chronic interstitial coarsening. No pulmonary edema or pneumothorax. Chronic change of the right shoulder. IMPRESSION: 1. Suspected small pleural effusions, right greater than left. 2. Large hiatal hernia. Aortic Atherosclerosis (ICD10-I70.0). Electronically Signed   By: Keith Rake M.D.   On: 01/13/2021 16:17    EKG: Independently reviewed. Sinus, no PR or QTC concerns  Assessment/Plan Active Problems:   AMS (altered mental status)  (please populate well all problems here in Problem List. (For example, if patient is on BP meds at home and you resume or decide to hold them, it is a problem that needs to be her. Same for CAD, COPD, HLD and so on)  EMS, likely Delirium -Suspect metabolic encephalopathy -Given the new onset of fever suspect underlying infection: UA does show 21-50  WBC/hpf, but looking back on pt's urine culture in the past,  two occasions in 07/2020 and 09/2020 bother showed mixed flora, raised the possibility of colonization rather than real infection. Her Chest xray today showed a RLL infiltrate vs consolidation, although son does not remember pt has aspiration events in patient's past. Will order CT chest to better characterize. -Other test, TSH, ammonia pending. Also order an EEG to rule out seizure.  PAF -Sinus rhythm, CT head negative for bleeding, continue Eliquis.  Baseline dementia with periotic agitation -Suspect Sundowning, son not sure why patient not on dementia meds -Order PRN Halodol for 2 days.  HTN -Controlled, continue home BP regimen.  Recent stroke with residue right sided weakness -Strength appears to be at baseline -PT evaluation.  DVT prophylaxis: Continue Eliquis Code Status: DNR Family Communication: Son at bedside Disposition Plan: Expect 2-3 days hospital stay to treat AMS and PT evaluation, expect to go back to SNF. Consults called: None Admission status: Tele admit   Lequita Halt MD [facility] Pager [phone]  01/13/2021, 7:00 PM

## 2021-01-13 NOTE — ED Provider Notes (Signed)
Care of the patient signed out from [facility].  Please see her note for full HPI.  In short, 87 year old female presented with altered mental status from [facility].  Last known normal at around 7 PM last night.  She is normally wheelchair-bound, but is normally alert and oriented.  Was noninteractive with her son today.  She did have some right-sided weakness on exam, however per chart review this appears to be chronic.  For prior provider showed a CBC without leukocytosis, CMP with a and at baseline.  UA with large leukocytes, more than 50 WBCs, many bacteria.  VBG with evidence of metabolic alkalosis.  Covid test is negative.  CT of the head without any acute changes.  She was started on Rocephin.  Chest x-ray with hospital small pleural effusions bilaterally.  Consulted Dr. Roosevelt Locks for admission who will admit the patient for further evaluation and treatment.   Garald Balding, PA-C 01/13/21 1845    Tegeler, Gwenyth Allegra, MD 01/14/21 [phone]

## 2021-01-13 NOTE — ED Provider Notes (Signed)
[place] EMERGENCY DEPARTMENT Provider Note   CSN: [id number] Arrival date & time: 01/13/21  1511     History Chief Complaint  Patient presents with  . Altered Mental Status    Alyssa Odonnell is a 87 y.o. female with past medical history significant for hypertension, hyperlipidemia, anaphylactic shock, proximal A. fib, CVA, UTI.  Patient is DNR, form is at the bedside.  HPI Patient presenting to emergency room today via EMS from [facility] for altered mental status.  5 caveat applies as patient unable to give history secondary to mental status change.  Per EMS patient's last known normal was around 7 PM last night.  When family was visiting patient today they noticed that she was extremely lethargic and not as interactive as usual.  She normally knows herself and son.  No known fall or head injury.  Attempted to call [facility] without able to get through to nursing staff obtain more history.  Patient's son arrived to provide more history.  She has a Actuary daily that checks on her.  Yesterday she had a normal day.  She is wheelchair-bound.  This morning she seemed to be not wanting to engage in conversation.  There is no actual loss of consciousness however.  When son arrived at noon she did not recognize him. He denies any known history of fall or trauma.    Past Medical History:  Diagnosis Date  . Acid reflux   . Acquired hammer toe   . Adenomatous colon polyp   . Alopecia   . Arthralgia of pelvic region and thigh   . Bunion   . Callus   . Cataract   . Gastric outlet obstruction 08/2019  . Glaucoma   . H/O acute bronchitis   . Hearing loss, sensorineural   . Hx of anaphylactic shock   . Hyperlipidemia   . Hypertension   . Hypothyroid   . Impingement syndrome of shoulder   . Incisional hernia, incarcerated   . Migraine headache   . Non-neoplastic nevus   . Osteoarthritis, knee   . PAF (paroxysmal atrial fibrillation) ([place])   . Plantar fat pad  atrophy   . RLS (restless legs syndrome)   . Stroke ([place])   . Tinea corporis   . UTI (urinary tract infection)   . Vitamin D deficiency     Patient Active Problem List   Diagnosis Date Noted  . Acute ischemic stroke ([place]) 10/15/2020  . Elevated INR 10/15/2020  . Adjustment disorder with depressed mood 07/22/2020  . Dementia with behavioral disturbance ([place])   . Open fracture of right zygomatic arch ([place])   . Palliative care by specialist   . DNR (do not resuscitate)   . Malnutrition of moderate degree 10/02/2019  . Pressure injury of skin 10/01/2019  . Malnutrition ([place]) 10/01/2019  . Hypothyroid 10/01/2019  . Failure to thrive in adult 09/30/2019  . Pleural effusion, bilateral 09/30/2019  . Chronic anticoagulation 09/30/2019  . 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 Gastric outlet obstruction 08/29/2019  . PAF (paroxysmal atrial fibrillation) ([place]) 02/02/2018  . 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[place]) 05/24/2017  . Cerebrovascular accident (CVA) due to embolism of left posterior cerebral artery ([place]) 03/09/2017  . htn 03/09/2017  . Generalized weakness 03/09/2017  . Stroke-like symptom 03/09/2017  . Right sided weakness   . PAD (peripheral artery disease) ([place])   . Essential hypertension   . Spinal stenosis of lumbar region   . Pure hypercholesterolemia   . Recurrent  major depressive disorder [facility])     Past Surgical History:  Procedure Laterality Date  . ABDOMINAL AORTOGRAM W/LOWER EXTREMITY Right 09/11/2019   Procedure: ABDOMINAL AORTOGRAM W/LOWER EXTREMITY;  Surgeon: Waynetta Sandy, MD;  Location: [place] CV LAB;  Service: Cardiovascular;  Laterality: Right;  . ABDOMINAL HERNIA REPAIR    . CHOLECYSTECTOMY    . COLONOSCOPY    . GASTROSTOMY N/A 09/01/2019   Procedure: INSERTION OF GASTROSTOMY TUBE;  Surgeon: Erroll Luna, MD;  Location: [place];  Service: General;  Laterality: N/A;  . hernia repair w/ mesh    . HIATAL HERNIA REPAIR    . LAPAROSCOPIC LYSIS OF  ADHESIONS  09/01/2019   Procedure: Laparoscopic Lysis Of Adhesions;  Surgeon: Erroll Luna, MD;  Location: [place];  Service: General;;  . LAPAROSCOPIC NISSEN FUNDOPLICATION N/A 07/37/1062   Procedure: LAPAROSCOPIC, POSSIBLE OPEN, GASTROPEXY;  Surgeon: Erroll Luna, MD;  Location: [place];  Service: General;  Laterality: N/A;  . LOOP RECORDER INSERTION N/A 03/11/2017   Procedure: Loop Recorder Insertion;  Surgeon: Evans Lance, MD;  Location: [place] CV [facility];  Service: Cardiovascular;  Laterality: N/A;  . NEUROPLASTY / TRANSPOSITION MEDIAN NERVE AT CARPAL TUNNEL    . PERIPHERAL VASCULAR BALLOON ANGIOPLASTY Left 09/11/2019   Procedure: PERIPHERAL VASCULAR BALLOON ANGIOPLASTY;  Surgeon: Waynetta Sandy, MD;  Location: [place] [facility];  Service: Cardiovascular;  Laterality: Left;  TP trunk  . PERIPHERAL VASCULAR CATHETERIZATION Right 07/06/2016   Procedure: Lower Extremity Angiography;  Surgeon: Angelia Mould, MD;  Location: [place] CV [facility];  Service: Cardiovascular;  Laterality: Right;  . PERIPHERAL VASCULAR CATHETERIZATION Right 07/06/2016   Procedure: Peripheral Vascular Intervention;  Surgeon: Angelia Mould, MD;  Location: [place] [facility];  Service: Cardiovascular;  Laterality: Right;  Superficial femoral  . PERIPHERAL VASCULAR INTERVENTION Left 09/11/2019   Procedure: PERIPHERAL VASCULAR INTERVENTION;  Surgeon: Waynetta Sandy, MD;  Location: [place] [facility];  Service: Cardiovascular;  Laterality: Left;  SFA  . skin lesion excision cheek/face    . TEE WITHOUT CARDIOVERSION N/A 03/11/2017   Procedure: TRANSESOPHAGEAL ECHOCARDIOGRAM (TEE);  Surgeon: Acie Fredrickson Wonda Cheng, MD;  Location: [facility] ENDOSCOPY;  Service: Cardiovascular;  Laterality: N/A;  . TOTAL HIP ARTHROPLASTY    . TUBAL LIGATION       OB History   No obstetric history on file.     Family History  Problem Relation Age of Onset  . Osteoporosis Mother   . Hypertension Mother   .  Heart failure Mother   . Stroke Father   . Heart disease Father   . Heart attack Father   . Cataracts Brother   . Colon cancer Paternal Aunt   . Glaucoma Paternal Aunt   . Dementia Neg Hx   . Alzheimer's disease Neg Hx     Social History   Tobacco Use  . Smoking status: Never Smoker  . Smokeless tobacco: Never Used  Vaping Use  . Vaping Use: Never used  Substance Use Topics  . Alcohol use: Never  . Drug use: Never    Home Medications Prior to Admission medications   Medication Sig Start Date End Date Taking? Authorizing Provider  acetaminophen (TYLENOL) 325 MG tablet Take 650 mg by mouth every 6 (six) hours as needed (pain).    [provider]  amLODipine (NORVASC) 10 MG tablet Take 10 mg by mouth daily. 10/13/20   [provider]  carvedilol (COREG) 3.125 MG tablet Take 3.125 mg by mouth 2 (two)  times daily. 10/14/20   [provider]  clopidogrel (PLAVIX) 75 MG tablet Take 75 mg by mouth daily.    [provider]  divalproex (DEPAKOTE SPRINKLE) 125 MG capsule Take 125-250 mg by mouth See admin instructions. 1 capsule in the morning and 2 capsules at bedtime.    [provider]  ELIQUIS 5 MG TABS tablet TAKE 1 TABLET BY MOUTH TWO  TIMES DAILY Patient taking differently: Take 5 mg by mouth 2 (two) times daily. 03/14/19   Evans Lance, MD  escitalopram (LEXAPRO) 20 MG tablet Take 20 mg by mouth daily.    [provider]  ezetimibe (ZETIA) 10 MG tablet Take 1 tablet (10 mg total) by mouth daily. 10/24/20   Swayze, Ava, DO  latanoprost (XALATAN) 0.005 % ophthalmic solution Place 1 drop into both eyes at bedtime. 09/09/20   [provider]  levothyroxine (SYNTHROID, LEVOTHROID) 100 MCG tablet Take 100 mcg by mouth daily before breakfast.  05/11/16   [provider]  LORazepam (ATIVAN) 0.5 MG tablet Take 1 tablet (0.5 mg total) by mouth 2 (two) times daily as needed for anxiety. 10/23/20   Swayze, Ava, DO  Multiple  Vitamins-Minerals (CERTA-VITE PO) Take 1 tablet by mouth daily.    [provider]  pantoprazole (PROTONIX) 40 MG tablet Take 40 mg by mouth daily.    [provider]  senna-docusate (SENOKOT-S) 8.6-50 MG tablet Take 1 tablet by mouth at bedtime as needed for mild constipation. 10/23/20   Swayze, Ava, DO  traMADol (ULTRAM) 50 MG tablet Take 1-2 tablets (50-100 mg total) by mouth every 6 (six) hours as needed for moderate pain or severe pain. 10/23/20   Swayze, Ava, DO  VRAYLAR capsule Take 3 mg by mouth daily. 10/09/20   [provider]    Allergies    Aspirin, Atorvastatin, Nabumetone, Nsaids, Pravastatin, Procaine, Rosuvastatin, Simvastatin, Statins, Evolocumab, Mobic [meloxicam], and Penicillins  Review of Systems   Review of Systems  Unable to perform ROS: Mental status change    Physical Exam Updated Vital Signs BP 124/75 (BP Location: Left Arm)   Pulse 87   Temp 98.4 F (36.9 C) (Oral)   Resp 20   SpO2 98%   Physical Exam Vitals and nursing note reviewed.  Constitutional:      General: She is not in acute distress.    Appearance: She is not ill-appearing or toxic-appearing.  HENT:     Head: Normocephalic and atraumatic.     Comments: No tenderness to palpation of skull. No deformities or crepitus noted. No open wounds, abrasions or lacerations.    Right Ear: Tympanic membrane and external ear normal. There is no impacted cerumen.     Left Ear: Tympanic membrane and external ear normal. There is no impacted cerumen.     Nose: Nose normal.     Mouth/Throat:     Mouth: Mucous membranes are dry.     Pharynx: Oropharynx is clear.  Eyes:     General: No scleral icterus.       Right eye: No discharge.        Left eye: No discharge.     Conjunctiva/sclera: Conjunctivae normal.     Pupils: Pupils are equal, round, and reactive to light.  Cardiovascular:     Rate and Rhythm: Normal rate and regular rhythm.     Pulses: Normal pulses.     Heart sounds:  Murmur heard.    Pulmonary:     Effort: Pulmonary effort is  normal.     Breath sounds: Normal breath sounds.  Abdominal:     General: Bowel sounds are normal. There is no distension.     Palpations: Abdomen is soft. There is no mass.     Tenderness: There is no abdominal tenderness. There is no right CVA tenderness, left CVA tenderness, guarding or rebound.     Hernia: No hernia is present.  Genitourinary:    Comments: Erythema noted to bilateral skin folds around groin. No satellite lesions, no desquamation., no bleeding  Musculoskeletal:     Comments: Full passive range of motion of bilateral upper and lower extremities.  Skin:    General: Skin is warm and dry.     Capillary Refill: Capillary refill takes less than 2 seconds.     Findings: No rash.  Neurological:     Comments: GCS 10  Patient knows self and year.  Speech is clear, she is speaking in a whisper.  Otherwise would not participate in the exam.  She opened her eyes when prompted multiple times with loud voice.  After stating her name she closed her eyes again.  Patient has decreased grip strength in right upper extremity 4/5 compared to the left 5/5. Patient will not participate with strength testing of lower extremities.     ED Results / Procedures / Treatments   Labs (all labs ordered are listed, but only abnormal results are displayed) Labs Reviewed  COMPREHENSIVE METABOLIC PANEL - Abnormal; Notable for the following components:      Result Value   Glucose, Bld 107 (*)    Creatinine, Ser 1.16 (*)    GFR, Estimated 46 (*)    All other components within normal limits  CBC WITH DIFFERENTIAL/PLATELET - Abnormal; Notable for the following components:   HCT 46.3 (*)    All other components within normal limits  URINALYSIS, COMPLETE (UACMP) WITH MICROSCOPIC - Abnormal; Notable for the following components:   Color, Urine AMBER (*)    APPearance CLOUDY (*)    Protein, ur 30 (*)    Leukocytes,Ua LARGE (*)    WBC,  UA >50 (*)    Bacteria, UA MANY (*)    All other components within normal limits  I-STAT VENOUS BLOOD GAS, ED - Abnormal; Notable for the following components:   pH, Ven 7.478 (*)    pCO2, Ven 38.0 (*)    Bicarbonate 28.2 (*)    Acid-Base Excess 4.0 (*)    Potassium 5.4 (*)    Calcium, Ion 1.12 (*)    Hemoglobin 15.3 (*)    All other components within normal limits  RESP PANEL BY RT-PCR (FLU A&B, COVID) ARPGX2  CULTURE, BLOOD (ROUTINE X 2)  CULTURE, BLOOD (ROUTINE X 2)  URINE CULTURE  LACTIC ACID, PLASMA  RAPID URINE DRUG SCREEN, HOSP PERFORMED  AMMONIA  ETHANOL  PROTIME-INR  APTT  CBG MONITORING, ED  CBG MONITORING, ED    EKG EKG Interpretation  Date/Time:  Monday January 13 2021 15:20:36 EDT Ventricular Rate:  82 PR Interval:    QRS Duration: 77 QT Interval:  398 QTC Calculation: 465 R Axis:   48 Text Interpretation: Sinus rhythm Borderline T abnormalities, anterior leads similar to prior. No STEMI Confirmed by Antony Blackbird [phone]) on 01/13/2021 5:01:24 PM   Radiology DG Chest Port 1 View  Result Date: 01/13/2021 CLINICAL DATA:  Altered mental status and shortness of breath. EXAM: PORTABLE CHEST 1 VIEW COMPARISON:  10/15/2020 FINDINGS: Rotated exam. Large hiatal hernia partially distorts mediastinal contours. This  is grossly unchanged allowing for differences in rotation. Planted loop recorder projects over the left chest wall. Suspected small pleural effusions, right greater than left. Chronic interstitial coarsening. No pulmonary edema or pneumothorax. Chronic change of the right shoulder. IMPRESSION: 1. Suspected small pleural effusions, right greater than left. 2. Large hiatal hernia. Aortic Atherosclerosis (ICD10-I70.0). Electronically Signed   By: Keith Rake M.D.   On: 01/13/2021 16:17    Procedures Procedures   Medications Ordered in ED Medications  cefTRIAXone (ROCEPHIN) 1 g in sodium chloride 0.9 % 100 mL IVPB (has no administration in time range)   Zinc Oxide (TRIPLE PASTE) 12.8 % ointment (has no administration in time range)    ED Course  I have reviewed the triage vital signs and the nursing notes.  Pertinent labs & imaging results that were available during my care of the patient were reviewed by me and considered in my medical decision making (see chart for details).    MDM Rules/Calculators/A&P                          History obtained by EMS and through chart review.  87 year old female presenting with altered mental status.  She is DNR with form at the bedside.  On ED arrival she is afebrile, hemodynamically stable.  She was noted to have temperature of 99.7 per EMS.  Rectal temperature taken here  is 98.2. On my exam she is nontoxic-appearing.  She has normal work of breathing and is protecting her airway.  She has decreased strength in right upper extremity compared to left.  Chart review comments on her right sided weakness from prior stroke. Patient otherwise does not really participate in exam.  She is able to tell me her name and the year.  Her lungs are clear to auscultation in all fields and she has normal work of breathing.  She did not respond to pain when having IV placed in her left arm, did flinch when IV was placed in the right arm.  Did grimace and pull away when Covid swab was obtained. She has erythema and skin irritation noted to groin without signs of infection, triple paste ordered. Altered mental status work-up initiated.  EKG shows no ischemic changes.  Chest x-ray viewed by me shows small pleural effusions, right >left and large hiatal hernia.  Blood cultures collected. Covid and influenza tests are negative. VBG shows pH of 7.4, CO2 of 38 and a bicarb of 28.2. CBC unremarkable. CMP without electrolyte derangement, kidney function consistent with baseline, no transaminitis. Lactic acid within normal range. UA collected via cath and is showing signs of urinary tract infection with large leukocytes, over 50 WBC  and many bacteria.  Urine culture was sent.  Patient given IV Rocephin for UTI.  She has a penicillin allergy listed as rash however upon chart review she has recently had Rocephin and tolerated it. Ammonia, INR, PTT and ethanol ordered.CT head has been ordered.  Patient care transferred to [facility]. Belaya PA-C at the end of my shift. Patient presentation, ED course, and plan of care discussed with review of all pertinent labs and imaging. Please see her note for further details regarding further ED course and disposition.   Portions of this note were generated with Lobby[employer]. Dictation errors may occur despite best attempts at proofreading.    Final Clinical Impression(s) / ED Diagnoses Final diagnoses:  Altered mental status, unspecified altered mental status type    Rx /  DC Orders ED Discharge Orders    None       Lewanda Rife 01/13/21 1803    Tegeler, Gwenyth Allegra, MD 01/14/21 [phone]

## 2021-01-14 ENCOUNTER — Encounter (HOSPITAL_COMMUNITY): Payer: Self-pay | Admitting: Internal Medicine

## 2021-01-14 ENCOUNTER — Inpatient Hospital Stay (HOSPITAL_COMMUNITY): Payer: Medicare Other

## 2021-01-14 ENCOUNTER — Other Ambulatory Visit: Payer: Self-pay

## 2021-01-14 LAB — CBC
HCT: 38.7 % (ref 36.0–46.0)
Hemoglobin: 12.7 g/dL (ref 12.0–15.0)
MCH: 30.4 pg (ref 26.0–34.0)
MCHC: 32.8 g/dL (ref 30.0–36.0)
MCV: 92.6 fL (ref 80.0–100.0)
Platelets: 215 10*3/uL (ref 150–400)
RBC: 4.18 MIL/uL (ref 3.87–5.11)
RDW: 13 % (ref 11.5–15.5)
WBC: 9.3 10*3/uL (ref 4.0–10.5)
nRBC: 0 % (ref 0.0–0.2)

## 2021-01-14 LAB — TSH: TSH: 5.741 u[IU]/mL — ABNORMAL HIGH (ref 0.350–4.500)

## 2021-01-14 LAB — VITAMIN B12: Vitamin B-12: 652 pg/mL (ref 180–914)

## 2021-01-14 NOTE — Evaluation (Signed)
Physical Therapy Evaluation Patient Details Name: Alyssa Odonnell MRN: [id number] DOB: 1933/10/21 Today's Date: 01/14/2021   History of Present Illness  87 y.o. female presented with AMS. CT head negative for acute changes. Suspected metabolic encephalopathy; EEG negative for seizures.  PMHx includes:   prior and recent stroke with residual right-sided weakness, hypertension, paroxysmal atrial fibrillation Eliquis, hypothyroidism, hypercholesterolemia, dementia, osteoarthritis and depression  Clinical Impression   Pt admitted with above diagnosis. Patient discharged [facility]/2020 with plan to rehab and then return home with son. Currently she requires max assist for rolling due to fear of falling. Anticipate may do better/less fearful with 2 person assist.  Pt currently with functional limitations due to the deficits listed below (see PT Problem List). Pt will benefit from skilled PT to increase their independence and safety with mobility to allow discharge to the venue listed below.       Follow Up Recommendations SNF    Equipment Recommendations  Wheelchair (measurements PT);Wheelchair cushion (measurements PT);Hospital bed;Other (comment) (lift (if family decides to take pt home))    Recommendations for Other Services OT consult     Precautions / Restrictions Precautions Precautions: Fall      Mobility  Bed Mobility Overal bed mobility: Needs Assistance Bed Mobility: Rolling Rolling: Max assist         General bed mobility comments: rolling to right, notes she is afraid of rolling off bed although rail up and starting from left side of mattress    Transfers                 General transfer comment: will need +2  Ambulation/Gait                Stairs            Wheelchair Mobility    Modified Rankin (Stroke Patients Only)       Balance                                             Pertinent Vitals/Pain Pain Assessment:  Faces Faces Pain Scale: Hurts little more Pain Location: knees Pain Descriptors / Indicators: Aching Pain Intervention(s): Limited activity within patient's tolerance;Repositioned;Other (comment) (ROM)    Home Living Family/patient expects to be discharged to:: Skilled nursing facility                 Additional Comments: discharged to [facility] jan 2022    Prior Function Level of Independence: Needs assistance   Gait / Transfers Assistance Needed: last admission (10/2020) was walking up to 25 ft with RW and min-mod assist; bed mobility and transfers min assist           Hand Dominance   Dominant Hand: Right    Extremity/Trunk Assessment   Upper Extremity Assessment Upper Extremity Assessment: Defer to OT evaluation    Lower Extremity Assessment Lower Extremity Assessment: RLE deficits/detail;LLE deficits/detail RLE Deficits / Details: knee flexion 0-100, ankle DF to 0 LLE Deficits / Details: knee flexion 0-80; ankle DF to 0       Communication   Communication: No difficulties  Cognition Arousal/Alertness: Awake/alert Behavior During Therapy: WFL for tasks assessed/performed Overall Cognitive Status: No family/caregiver present to determine baseline cognitive functioning  General Comments: oriented to person, DOB, place and situation      General Comments General comments (skin integrity, edema, etc.): RN reports pt has been "sundowning" and recommends PT attempt in mornings    Exercises General Exercises - Lower Extremity Ankle Circles/Pumps: AROM;Both;5 reps Heel Slides: AAROM;Both;5 reps   Assessment/Plan    PT Assessment Patient needs continued PT services  PT Problem List Decreased strength;Decreased range of motion;Decreased activity tolerance;Decreased balance;Decreased mobility;Decreased cognition;Decreased knowledge of use of DME;Pain       PT Treatment Interventions DME instruction;Gait  training;Functional mobility training;Therapeutic activities;Therapeutic exercise;Balance training;Neuromuscular re-education;Cognitive remediation;Patient/family education    PT Goals (Current goals can be found in the Care Plan section)  Acute Rehab PT Goals Patient Stated Goal: wants to feel more comfortable PT Goal Formulation: Patient unable to participate in goal setting Time For Goal Achievement: 01/28/21 Potential to Achieve Goals: Fair    Frequency Min 2X/week   Barriers to discharge Decreased caregiver support      Co-evaluation               AM-PAC PT "6 Clicks" Mobility  Outcome Measure Help needed turning from your back to your side while in a flat bed without using bedrails?: Total Help needed moving from lying on your back to sitting on the side of a flat bed without using bedrails?: Total Help needed moving to and from a bed to a chair (including a wheelchair)?: Total Help needed standing up from a chair using your arms (e.g., wheelchair or bedside chair)?: Total Help needed to walk in hospital room?: Total Help needed climbing 3-5 steps with a railing? : Total 6 Click Score: 6    End of Session   Activity Tolerance: Other (comment) (limited by anxiety) Patient left: in bed;with call bell/phone within reach;with bed alarm set Nurse Communication: Mobility status PT Visit Diagnosis: Muscle weakness (generalized) (M62.81);Difficulty in walking, not elsewhere classified (R26.2)    Time: 1455-1510 PT Time Calculation (min) (ACUTE ONLY): 15 min   Charges:   PT Evaluation $PT Eval Moderate Complexity: 1 Mod           Arby Barrette, PT Pager [phone]   Rexanne Mano 01/14/2021, 4:18 PM

## 2021-01-14 NOTE — ED Notes (Signed)
Called to give report. RN will call back.

## 2021-01-14 NOTE — Consult Note (Signed)
Telespecialist EEG Report   EEG Date 01/14/21   Duration: 28.48   Clinical Indication:  seizure r/o epileptiform discharges   EEG Procedure:  This is a digitally recorded routine electroencephalogram. The international 10-20 electrode placement system is used for scalp electrode placement. Eighteen channels of scalp EEG are recorded The data are stored digitally and reviewed in reformatted montages for optimal display.   EEG Description: This EEG is well organized.  There was a well formed, well sustained and symmetrical 7-8 hz posterior dominant rhythm that attenuated with eye opening and drowsiness.  The background consisted of a mixture of alpha and beta and sometimes theta frequencies of low voltage.  During drowsiness, background slowing increased.  Stage 2 sleep was not recorded. There were no focal abnormalities, persistent asymmetries or epileptiform discharges.  Activating procedures:  These were not performed.  EKG:  The EKG rhythm strip showed a NSR with sinus arrhythmia present at times.   EEG Classification:  Mildly abnormal 1.  Background slowing, generalized   EEG Interpretation: This routine EEG recorded in the awake and drowsy states is midly abnormal.  There mild background slowing is suggestive of mild diffuse cerebral dysfunction.  There were no focal abnormalities, persistent asymmetries or epileptiform discharges.

## 2021-01-14 NOTE — Progress Notes (Signed)
PROGRESS NOTE    Alyssa Odonnell  WNI:[id number] DOB: May 09, 1934 DOA: 01/13/2021 PCP: Jenel Lucks, PA-C   Brief Narrative:  This 87 years old female with PMH significant for recent CVA with residual right-sided weakness, advanced dementia, A. fib on Eliquis, hypertension presented in the ED with altered mental status.  Patient is unable to provide history, most of the history is obtained from patient's son at bedside.  Son reports patient does have intermittent episodes of confusion and agitation at baseline.  She was found poorly responsive by the nursing staff at the nursing home, patient was found to have a temp of 99.7.  Patient is brought in the ED, CT head negative for acute abnormality,  UA consistent with UTI, chest x-ray shows some infiltrate but CT chest ruled out pneumonia. Patient is admitted with altered mental status secondary to UTI.  Awaiting urine cultures,  started on antibiotics.  Assessment & Plan:   Active Problems:   AMS (altered mental status)   Altered mental status could be secondary to UTI: Patient was found poorly responsive, temp of 99.7 at nursing home. UA does show 21-50 WBC/hpf, but looking back on pt's urine culture in the past, two occasions in 07/2020 and 09/2020 showed mixed flora, raised the possibility of colonization rather than real infection.  Her Chest xray today showed a RLL infiltrate vs consolidation, although son does not remember pt has aspiration events in patient's past.  CT chest-Coronary artery calcifications suggesting coronary artery disease. Large sliding-type hiatal hernia but no bowel obstruction is noted. Ammonia level normal, TSH 5.7 EEG completed: No evidence of seizures. Patient was given ceftriaxone,  will continue ceftriaxone for 3 days. Follow-up urine culture. Patient seems back to her baseline mental status. PT and OT evaluation  PAF -Remains in sinus rhythm, CT head negative for bleeding, continue  Eliquis.  Baseline dementia with periotic agitation -Suspect Sundowning, son not sure why patient not on dementia meds -Order PRN Halodol for 2 days.  HTN -Controlled, continue home BP regimen.  Recent stroke with residue right sided weakness -Strength appears to be at baseline. -PT evaluation.   DVT prophylaxis:Eliquis Code Status: DNR Family Communication:  No family at bed side. Disposition Plan:  Status is: Inpatient  Remains inpatient appropriate because:Inpatient level of care appropriate due to severity of illness   Dispo: The patient is from: Home              Anticipated d/c is to: SNF              Patient currently is not medically stable to d/c.   Difficult to place patient No   Consultants:   Neurology  Procedures: EEG  Antimicrobials:  Anti-infectives (From admission, onward)   Start     Dose/Rate Route Frequency Ordered Stop   01/14/21 1000  cefTRIAXone (ROCEPHIN) 1 g in sodium chloride 0.9 % 100 mL IVPB        1 g 200 mL/hr over 30 Minutes Intravenous Every 24 hours 01/13/21 1857     01/13/21 1745  cefTRIAXone (ROCEPHIN) 1 g in sodium chloride 0.9 % 100 mL IVPB        1 g 200 mL/hr over 30 Minutes Intravenous  Once 01/13/21 1737 01/13/21 1931      Subjective: Patient was seen and examined at bedside.  Overnight events noted.  Patient seems alert and oriented.   Denies any chest pain or shortness of breath but reports having urinary symptoms.  Objective: Vitals:  01/14/21 0933 01/14/21 1000 01/14/21 1056 01/14/21 1100  BP: (!) 148/92  128/79 128/79  Pulse: 82 78 89 89  Resp: 20 17  16   Temp: 97.8 F (36.6 C)   98.3 F (36.8 C)  TempSrc: Oral   Oral  SpO2: 99% 100%  99%  Weight:   59.4 kg   Height:   5\' 3"  (1.6 m)     Intake/Output Summary (Last 24 hours) at 01/14/2021 1741 Last data filed at 01/14/2021 1500 Gross per 24 hour  Intake 1420 ml  Output --  Net 1420 ml   Filed Weights   01/14/21 1056  Weight: 59.4 kg     Examination:  General exam: Appears calm and comfortable, not in any acute distress Respiratory system: Clear to auscultation. Respiratory effort normal. Cardiovascular system: S1 & S2 heard, RRR. No JVD, murmurs, rubs, gallops or clicks. No pedal edema. Gastrointestinal system: Abdomen is nondistended, soft and nontender. No organomegaly or masses felt. Normal bowel sounds heard. Central nervous system: Alert and oriented. No focal neurological deficits. Extremities: Symmetric 5 x 5 power.  Partial right-sided weakness noted Skin: No rashes, lesions or ulcers Psychiatry: Judgement and insight appear normal. Mood & affect appropriate.     Data Reviewed: I have personally reviewed following labs and imaging studies  CBC: Recent Labs  Lab 01/13/21 1542 01/13/21 1547 01/14/21 0332  WBC 9.4  --  9.3  NEUTROABS 5.8  --   --   HGB 14.9 15.3* 12.7  HCT 46.3* 45.0 38.7  MCV 93.7  --  92.6  PLT 224  --  188   Basic Metabolic Panel: Recent Labs  Lab 01/13/21 1542 01/13/21 1547  NA 136 138  K 4.2 5.4*  CL 104  --   CO2 23  --   GLUCOSE 107*  --   BUN 22  --   CREATININE 1.16*  --   CALCIUM 9.6  --    GFR: Estimated Creatinine Clearance: 29.9 mL/min (A) (by C-G formula based on SCr of 1.16 mg/dL (H)). Liver Function Tests: Recent Labs  Lab 01/13/21 1542  AST 32  ALT 29  ALKPHOS 74  BILITOT 0.6  PROT 6.7  ALBUMIN 3.5   No results for input(s): LIPASE, AMYLASE in the last 168 hours. Recent Labs  Lab 01/13/21 1900  AMMONIA 28   Coagulation Profile: No results for input(s): INR, PROTIME in the last 168 hours. Cardiac Enzymes: No results for input(s): CKTOTAL, CKMB, CKMBINDEX, TROPONINI in the last 168 hours. BNP (last 3 results) No results for input(s): PROBNP in the last 8760 hours. HbA1C: No results for input(s): HGBA1C in the last 72 hours. CBG: Recent Labs  Lab 01/13/21 1713  GLUCAP 92   Lipid Profile: No results for input(s): CHOL, HDL, LDLCALC,  TRIG, CHOLHDL, LDLDIRECT in the last 72 hours. Thyroid Function Tests: Recent Labs    01/14/21 1110  TSH 5.741*   Anemia Panel: Recent Labs    01/14/21 1110  VITAMINB12 652   Sepsis Labs: Recent Labs  Lab 01/13/21 1543  LATICACIDVEN 1.9    Recent Results (from the past 240 hour(s))  Blood Cultures (routine x 2)     Status: None (Preliminary result)   Collection Time: 01/13/21  3:43 PM   Specimen: BLOOD LEFT ARM  Result Value Ref Range Status   Specimen Description BLOOD LEFT ARM  Final   Special Requests   Final    BOTTLES DRAWN AEROBIC AND ANAEROBIC Blood Culture results may not be optimal due  to an inadequate volume of blood received in culture bottles   Culture   Final    NO GROWTH < 24 HOURS Performed at [facility] [address] [place], [place] [address]    Report Status PENDING  Incomplete  Resp Panel by RT-PCR (Flu A&B, Covid) Nasopharyngeal Swab     Status: None   Collection Time: 01/13/21  3:44 PM   Specimen: Nasopharyngeal Swab; Nasopharyngeal(NP) swabs in vial transport medium  Result Value Ref Range Status   SARS Coronavirus 2 by RT PCR NEGATIVE NEGATIVE Final    Comment: (NOTE) SARS-CoV-2 target nucleic acids are NOT DETECTED.  The SARS-CoV-2 RNA is generally detectable in upper respiratory specimens during the acute phase of infection. The lowest concentration of SARS-CoV-2 viral copies this assay can detect is 138 copies/mL. A negative result does not preclude SARS-Cov-2 infection and should not be used as the sole basis for treatment or other patient management decisions. A negative result may occur with  improper specimen collection/handling, submission of specimen other than nasopharyngeal swab, presence of viral mutation(s) within the areas targeted by this assay, and inadequate number of viral copies(<138 copies/mL). A negative result must be combined with clinical observations, patient history, and epidemiological information. The  expected result is Negative.  Fact Sheet for Patients:  EntrepreneurPulse.com.au  Fact Sheet for Healthcare Providers:  IncredibleEmployment.be  This test is no t yet approved or cleared by the [place] FDA and  has been authorized for detection and/or diagnosis of SARS-CoV-2 by FDA under an Emergency Use Authorization (EUA). This EUA will remain  in effect (meaning this test can be used) for the duration of the COVID-19 declaration under Section 564(b)(1) of the Act, 21 U.S.C.section 360bbb-3(b)(1), unless the authorization is terminated  or revoked sooner.       Influenza A by PCR NEGATIVE NEGATIVE Final   Influenza B by PCR NEGATIVE NEGATIVE Final    Comment: (NOTE) The Xpert Xpress SARS-CoV-2/FLU/RSV plus assay is intended as an aid in the diagnosis of influenza from Nasopharyngeal swab specimens and should not be used as a sole basis for treatment. Nasal washings and aspirates are unacceptable for Xpert Xpress SARS-CoV-2/FLU/RSV testing.  Fact Sheet for Patients: EntrepreneurPulse.com.au  Fact Sheet for Healthcare Providers: IncredibleEmployment.be  This test is not yet approved or cleared by the [place] FDA and has been authorized for detection and/or diagnosis of SARS-CoV-2 by FDA under an Emergency Use Authorization (EUA). This EUA will remain in effect (meaning this test can be used) for the duration of the COVID-19 declaration under Section 564(b)(1) of the Act, 21 U.S.C. section 360bbb-3(b)(1), unless the authorization is terminated or revoked.  Performed at [facility], [place] [address] [place], [place] [address]   Blood Cultures (routine x 2)     Status: None (Preliminary result)   Collection Time: 01/13/21  3:48 PM   Specimen: BLOOD RIGHT ARM  Result Value Ref Range Status   Specimen Description BLOOD RIGHT ARM  Final   Special Requests   Final    BOTTLES DRAWN  AEROBIC AND ANAEROBIC Blood Culture results may not be optimal due to an inadequate volume of blood received in culture bottles   Culture   Final    NO GROWTH < 24 HOURS Performed at [facility], [place] [address] [place],  [address]    Report Status PENDING  Incomplete    Radiology Studies: CT HEAD WO CONTRAST  Result Date: 01/13/2021 CLINICAL DATA:  Mental  status change, unknown cause EXAM: CT HEAD WITHOUT CONTRAST TECHNIQUE: Contiguous axial images were obtained from the base of the skull through the vertex without intravenous contrast. COMPARISON:  Head CT 12/03/2020 FINDINGS: Brain: No hemorrhage. Stable degree of atrophy. Moderate to advanced chronic small vessel ischemia. Remote left occipital infarct. Remote bilateral basal gangliar lacunar infarcts. No evidence of acute ischemia. No subdural or extra-axial collection. No midline shift or mass effect. Vascular: Atherosclerosis of skullbase vasculature without hyperdense vessel or abnormal calcification. Skull: No fracture or focal lesion. Sinuses/Orbits: Right maxillary sinus mucosal thickening partially included. No acute findings. Other: None. IMPRESSION: 1. No acute intracranial abnormality. 2. Stable atrophy, chronic small vessel ischemia, and remote infarcts. Electronically Signed   By: Keith Rake M.D.   On: 01/13/2021 18:14   CT CHEST WO CONTRAST  Result Date: 01/13/2021 CLINICAL DATA:  Pneumonia. EXAM: CT CHEST WITHOUT CONTRAST TECHNIQUE: Multidetector CT imaging of the chest was performed following the standard protocol without IV contrast. COMPARISON:  September 30, 2019. FINDINGS: Cardiovascular: Atherosclerosis of thoracic aorta is noted without aneurysm formation. Normal cardiac size. No pericardial effusion. Coronary artery calcifications are noted. Mediastinum/Nodes: Thyroid gland is unremarkable. No adenopathy is noted. Large sliding-type hiatal hernia is noted with a loop of transverse colon also extending into  the hiatal hernia, but no bowel obstruction is noted. Lungs/Pleura: No pneumothorax or pleural effusion is noted. Left lung is clear. Mild right basilar subsegmental atelectasis is noted. Upper Abdomen: No acute abnormality. Musculoskeletal: No chest wall mass or suspicious bone lesions identified. IMPRESSION: 1. Coronary artery calcifications are noted suggesting coronary artery disease. 2. Large sliding-type hiatal hernia is noted with a loop of transverse colon also extending into the hiatal hernia, but no bowel obstruction is noted. 3. Mild right basilar subsegmental atelectasis is noted. 4. Aortic atherosclerosis. Aortic Atherosclerosis (ICD10-I70.0). Electronically Signed   By: Marijo Conception M.D.   On: 01/13/2021 20:26   DG Chest Port 1 View  Result Date: 01/13/2021 CLINICAL DATA:  Altered mental status and shortness of breath. EXAM: PORTABLE CHEST 1 VIEW COMPARISON:  10/15/2020 FINDINGS: Rotated exam. Large hiatal hernia partially distorts mediastinal contours. This is grossly unchanged allowing for differences in rotation. Planted loop recorder projects over the left chest wall. Suspected small pleural effusions, right greater than left. Chronic interstitial coarsening. No pulmonary edema or pneumothorax. Chronic change of the right shoulder. IMPRESSION: 1. Suspected small pleural effusions, right greater than left. 2. Large hiatal hernia. Aortic Atherosclerosis (ICD10-I70.0). Electronically Signed   By: Keith Rake M.D.   On: 01/13/2021 16:17   Scheduled Meds: . apixaban  5 mg Oral BID  . cariprazine  3 mg Oral Daily  . carvedilol  3.125 mg Oral BID  . escitalopram  20 mg Oral Daily  . ezetimibe  10 mg Oral Daily  . latanoprost  1 drop Both Eyes QHS  . levothyroxine  100 mcg Oral QAC breakfast  . pantoprazole  40 mg Oral Daily   Continuous Infusions: . sodium chloride Stopped (01/14/21 1052)  . cefTRIAXone (ROCEPHIN)  IV Stopped (01/14/21 1056)     LOS: 1 day    Time spent: 35  mins    Julianah Marciel, MD [facility]   If 7PM-7AM, please contact night-coverage

## 2021-01-14 NOTE — Progress Notes (Signed)
EEG complete - results pending 

## 2021-01-14 NOTE — ED Notes (Signed)
Pt complained of leg pain in both legs.  Put pillow in between legs, and pt stated they felt slightly better.

## 2021-01-15 LAB — BASIC METABOLIC PANEL
Anion gap: 8 (ref 5–15)
BUN: 18 mg/dL (ref 8–23)
CO2: 23 mmol/L (ref 22–32)
Calcium: 8.9 mg/dL (ref 8.9–10.3)
Chloride: 104 mmol/L (ref 98–111)
Creatinine, Ser: 1.06 mg/dL — ABNORMAL HIGH (ref 0.44–1.00)
GFR, Estimated: 52 mL/min — ABNORMAL LOW (ref >60)
Glucose, Bld: 85 mg/dL (ref 70–99)
Potassium: 3.3 mmol/L — ABNORMAL LOW (ref 3.5–5.1)
Sodium: 135 mmol/L (ref 135–145)

## 2021-01-15 LAB — GLUCOSE, CAPILLARY: Glucose-Capillary: 100 mg/dL — ABNORMAL HIGH (ref 70–99)

## 2021-01-15 LAB — CBC
HCT: 34.3 % — ABNORMAL LOW (ref 36.0–46.0)
Hemoglobin: 11.4 g/dL — ABNORMAL LOW (ref 12.0–15.0)
MCH: 30.5 pg (ref 26.0–34.0)
MCHC: 33.2 g/dL (ref 30.0–36.0)
MCV: 91.7 fL (ref 80.0–100.0)
Platelets: 157 10*3/uL (ref 150–400)
RBC: 3.74 MIL/uL — ABNORMAL LOW (ref 3.87–5.11)
RDW: 12.8 % (ref 11.5–15.5)
WBC: 8.1 10*3/uL (ref 4.0–10.5)
nRBC: 0 % (ref 0.0–0.2)

## 2021-01-15 LAB — MAGNESIUM: Magnesium: 1.9 mg/dL (ref 1.7–2.4)

## 2021-01-15 LAB — PHOSPHORUS: Phosphorus: 3.3 mg/dL (ref 2.5–4.6)

## 2021-01-15 MED ORDER — APIXABAN 2.5 MG PO TABS
2.5000 mg | ORAL_TABLET | Freq: Two times a day (BID) | ORAL | Status: DC
Start: 2021-01-15 — End: 2021-01-18
  Administered 2021-01-15 – 2021-01-17 (×4): 2.5 mg via ORAL
  Filled 2021-01-15 (×4): qty 1

## 2021-01-15 MED ORDER — POTASSIUM CHLORIDE 20 MEQ PO PACK
40.0000 meq | PACK | Freq: Once | ORAL | Status: AC
  Administered 2021-01-15: 40 meq via ORAL
  Filled 2021-01-15: qty 2

## 2021-01-15 NOTE — Discharge Instructions (Signed)

## 2021-01-15 NOTE — Progress Notes (Signed)
Age>85 yo and wt<60kg, ok to reduce apixaban to 2.5mg  BID per Dr. Dwyane Dee.   Onnie Boer, PharmD, BCIDP, AAHIVP, CPP Infectious Disease Pharmacist 01/15/2021 10:19 AM

## 2021-01-15 NOTE — Progress Notes (Addendum)
PROGRESS NOTE    Alyssa Odonnell  MVE:[id number] DOB: 1934-10-05 DOA: 01/13/2021 PCP: Jenel Lucks, PA-C   Brief Narrative:  This 87 years old female with PMH significant for recent CVA with residual right-sided weakness, advanced dementia, A. fib on Eliquis, hypertension presented in the ED with altered mental status.  Patient is unable to provide history, most of the history is obtained from patient's son at bedside.  Son reports patient does have intermittent episodes of confusion and agitation at baseline.  She was found poorly responsive by the nursing staff at the nursing home, patient was found to have a temp of 99.7.  Patient is brought in the ED, CT head negative for acute abnormality,  UA consistent with UTI, chest x-ray shows some infiltrate but CT chest ruled out pneumonia. Patient is admitted with altered mental status secondary to UTI.  Urine culture grew Citrobacter Koseri,  started on antibiotics.  Assessment & Plan:   Active Problems:   AMS (altered mental status)   Altered mental status could be secondary to UTI: Patient was found poorly responsive, temp of 99.7 at nursing home. UA does show 21-50 WBC/hpf, but looking back on pt's urine culture in the past, two occasions in 07/2020 and 09/2020 showed mixed flora, raised the possibility of colonization rather than real infection.  Her Chest xray today showed a RLL infiltrate vs consolidation, although son does not remember pt has aspiration events in patient's past.  CT chest-Coronary artery calcifications suggesting coronary artery disease. Large sliding-type hiatal hernia but no bowel obstruction is noted. Ammonia level normal, TSH 5.7 EEG completed: No evidence of seizures. Patient was given ceftriaxone,  will continue ceftriaxone for 3 days. Urine culture grew Citrobacter koseri, sensitivity pending Patient seems back to her baseline mental status. PT and OT evaluation : SNF  Proxysmal Atrial  fibrillation: -Remains in sinus rhythm, CT head negative for bleeding, continue Eliquis.  Baseline dementia with periodic agitation: -Suspect Sundowning, son not sure why patient not on dementia meds -Continue PRN Halodol for 2 days.  HTN -Controlled, continue home BP regimen.  Recent stroke with residue right sided weakness -Strength appears to be at baseline. -PT evaluation.   DVT prophylaxis:Eliquis Code Status: DNR Family Communication:  No family at bed side. Disposition Plan:  Status is: Inpatient  Remains inpatient appropriate because:Inpatient level of care appropriate due to severity of illness   Dispo: The patient is from: Home              Anticipated d/c is to: SNF 3/31              Patient currently is not medically stable to d/c.   Difficult to place patient No   Consultants:   Neurology  Procedures: EEG  Antimicrobials:  Anti-infectives (From admission, onward)   Start     Dose/Rate Route Frequency Ordered Stop   01/14/21 1000  cefTRIAXone (ROCEPHIN) 1 g in sodium chloride 0.9 % 100 mL IVPB        1 g 200 mL/hr over 30 Minutes Intravenous Every 24 hours 01/13/21 1857     01/13/21 1745  cefTRIAXone (ROCEPHIN) 1 g in sodium chloride 0.9 % 100 mL IVPB        1 g 200 mL/hr over 30 Minutes Intravenous  Once 01/13/21 1737 01/13/21 1931      Subjective: Patient was seen and examined at bedside.  Overnight events noted.   Patient seems alert and oriented x 2, appears like at her baseline mental  status.   Denies any chest pain or shortness of breath but reports having urinary symptoms.  Objective: Vitals:   01/14/21 1100 01/14/21 2010 01/15/21 0503 01/15/21 1157  BP: 128/79 123/74 128/68 106/83  Pulse: 89 94 76 86  Resp: 16 18 20 14   Temp: 98.3 F (36.8 C) 97.9 F (36.6 C) 99.1 F (37.3 C) 98.1 F (36.7 C)  TempSrc: Oral Oral Axillary   SpO2: 99% 98% 98%   Weight:      Height:        Intake/Output Summary (Last 24 hours) at 01/15/2021  1211 Last data filed at 01/15/2021 0626 Gross per 24 hour  Intake 320 ml  Output 550 ml  Net -230 ml   Filed Weights   01/14/21 1056  Weight: 59.4 kg    Examination:  General exam: Appears calm and comfortable, not in any acute distress Respiratory system: Clear to auscultation. Respiratory effort normal. Cardiovascular system: S1 & S2 heard, RRR. No JVD, murmurs, rubs, gallops or clicks. No pedal edema. Gastrointestinal system: Abdomen is nondistended, soft and nontender. No organomegaly or masses felt.  Normal bowel sounds heard. Central nervous system: Alert and oriented. No focal neurological deficits. Extremities: Symmetric 5 x 5 power.  Partial right-sided weakness noted Skin: No rashes, lesions or ulcers Psychiatry: Judgement and insight appear normal. Mood & affect appropriate.     Data Reviewed: I have personally reviewed following labs and imaging studies  CBC: Recent Labs  Lab 01/13/21 1542 01/13/21 1547 01/14/21 0332 01/15/21 0251  WBC 9.4  --  9.3 8.1  NEUTROABS 5.8  --   --   --   HGB 14.9 15.3* 12.7 11.4*  HCT 46.3* 45.0 38.7 34.3*  MCV 93.7  --  92.6 91.7  PLT 224  --  215 948   Basic Metabolic Panel: Recent Labs  Lab 01/13/21 1542 01/13/21 1547 01/15/21 0251  NA 136 138 135  K 4.2 5.4* 3.3*  CL 104  --  104  CO2 23  --  23  GLUCOSE 107*  --  85  BUN 22  --  18  CREATININE 1.16*  --  1.06*  CALCIUM 9.6  --  8.9  MG  --   --  1.9  PHOS  --   --  3.3   GFR: Estimated Creatinine Clearance: 32.7 mL/min (A) (by C-G formula based on SCr of 1.06 mg/dL (H)). Liver Function Tests: Recent Labs  Lab 01/13/21 1542  AST 32  ALT 29  ALKPHOS 74  BILITOT 0.6  PROT 6.7  ALBUMIN 3.5   No results for input(s): LIPASE, AMYLASE in the last 168 hours. Recent Labs  Lab 01/13/21 1900  AMMONIA 28   Coagulation Profile: No results for input(s): INR, PROTIME in the last 168 hours. Cardiac Enzymes: No results for input(s): CKTOTAL, CKMB, CKMBINDEX,  TROPONINI in the last 168 hours. BNP (last 3 results) No results for input(s): PROBNP in the last 8760 hours. HbA1C: No results for input(s): HGBA1C in the last 72 hours. CBG: Recent Labs  Lab 01/13/21 1713  GLUCAP 92   Lipid Profile: No results for input(s): CHOL, HDL, LDLCALC, TRIG, CHOLHDL, LDLDIRECT in the last 72 hours. Thyroid Function Tests: Recent Labs    01/14/21 1110  TSH 5.741*   Anemia Panel: Recent Labs    01/14/21 1110  VITAMINB12 652   Sepsis Labs: Recent Labs  Lab 01/13/21 1543  LATICACIDVEN 1.9    Recent Results (from the past 240 hour(s))  Blood Cultures (routine  x 2)     Status: None (Preliminary result)   Collection Time: 01/13/21  3:43 PM   Specimen: BLOOD LEFT ARM  Result Value Ref Range Status   Specimen Description BLOOD LEFT ARM  Final   Special Requests   Final    BOTTLES DRAWN AEROBIC AND ANAEROBIC Blood Culture results may not be optimal due to an inadequate volume of blood received in culture bottles   Culture   Final    NO GROWTH 2 DAYS Performed at [facility], [place] [address] [place], [place] [address]    Report Status PENDING  Incomplete  Urine culture     Status: Abnormal (Preliminary result)   Collection Time: 01/13/21  3:43 PM   Specimen: Urine, Random  Result Value Ref Range Status   Specimen Description URINE, RANDOM  Final   Special Requests NONE  Final   Culture (A)  Final    >=100,000 COLONIES/mL CITROBACTER KOSERI SUSCEPTIBILITIES TO FOLLOW Performed at [facility], [place] [address], [place], [place] [address]    Report Status PENDING  Incomplete  Resp Panel by RT-PCR (Flu A&B, Covid) Nasopharyngeal Swab     Status: None   Collection Time: 01/13/21  3:44 PM   Specimen: Nasopharyngeal Swab; Nasopharyngeal(NP) swabs in vial transport medium  Result Value Ref Range Status   SARS Coronavirus 2 by RT PCR NEGATIVE NEGATIVE Final    Comment: (NOTE) SARS-CoV-2 target nucleic acids are NOT DETECTED.  The  SARS-CoV-2 RNA is generally detectable in upper respiratory specimens during the acute phase of infection. The lowest concentration of SARS-CoV-2 viral copies this assay can detect is 138 copies/mL. A negative result does not preclude SARS-Cov-2 infection and should not be used as the sole basis for treatment or other patient management decisions. A negative result may occur with  improper specimen collection/handling, submission of specimen other than nasopharyngeal swab, presence of viral mutation(s) within the areas targeted by this assay, and inadequate number of viral copies(<138 copies/mL). A negative result must be combined with clinical observations, patient history, and epidemiological information. The expected result is Negative.  Fact Sheet for Patients:  EntrepreneurPulse.com.au  Fact Sheet for Healthcare Providers:  IncredibleEmployment.be  This test is no t yet approved or cleared by the [place] FDA and  has been authorized for detection and/or diagnosis of SARS-CoV-2 by FDA under an Emergency Use Authorization (EUA). This EUA will remain  in effect (meaning this test can be used) for the duration of the COVID-19 declaration under Section 564(b)(1) of the Act, 21 U.S.C.section 360bbb-3(b)(1), unless the authorization is terminated  or revoked sooner.       Influenza A by PCR NEGATIVE NEGATIVE Final   Influenza B by PCR NEGATIVE NEGATIVE Final    Comment: (NOTE) The Xpert Xpress SARS-CoV-2/FLU/RSV plus assay is intended as an aid in the diagnosis of influenza from Nasopharyngeal swab specimens and should not be used as a sole basis for treatment. Nasal washings and aspirates are unacceptable for Xpert Xpress SARS-CoV-2/FLU/RSV testing.  Fact Sheet for Patients: EntrepreneurPulse.com.au  Fact Sheet for Healthcare Providers: IncredibleEmployment.be  This test is not yet approved or  cleared by the [place] FDA and has been authorized for detection and/or diagnosis of SARS-CoV-2 by FDA under an Emergency Use Authorization (EUA). This EUA will remain in effect (meaning this test can be used) for the duration of the COVID-19 declaration under Section 564(b)(1) of the Act, 21 U.S.C. section 360bbb-3(b)(1), unless the authorization is terminated or revoked.  Performed at [facility], [place] [address], [place], [place] [address]   Blood Cultures (routine x 2)     Status: None (Preliminary result)   Collection Time: 01/13/21  3:48 PM   Specimen: BLOOD RIGHT ARM  Result Value Ref Range Status   Specimen Description BLOOD RIGHT ARM  Final   Special Requests   Final    BOTTLES DRAWN AEROBIC AND ANAEROBIC Blood Culture results may not be optimal due to an inadequate volume of blood received in culture bottles   Culture   Final    NO GROWTH 2 DAYS Performed at [facility], [place] [address] [place], [place] [address]    Report Status PENDING  Incomplete    Radiology Studies: CT HEAD WO CONTRAST  Result Date: 01/13/2021 CLINICAL DATA:  Mental status change, unknown cause EXAM: CT HEAD WITHOUT CONTRAST TECHNIQUE: Contiguous axial images were obtained from the base of the skull through the vertex without intravenous contrast. COMPARISON:  Head CT 12/03/2020 FINDINGS: Brain: No hemorrhage. Stable degree of atrophy. Moderate to advanced chronic small vessel ischemia. Remote left occipital infarct. Remote bilateral basal gangliar lacunar infarcts. No evidence of acute ischemia. No subdural or extra-axial collection. No midline shift or mass effect. Vascular: Atherosclerosis of skullbase vasculature without hyperdense vessel or abnormal calcification. Skull: No fracture or focal lesion. Sinuses/Orbits: Right maxillary sinus mucosal thickening partially included. No acute findings. Other: None. IMPRESSION: 1. No acute intracranial abnormality. 2. Stable atrophy, chronic  small vessel ischemia, and remote infarcts. Electronically Signed   By: Keith Rake M.D.   On: 01/13/2021 18:14   CT CHEST WO CONTRAST  Result Date: 01/13/2021 CLINICAL DATA:  Pneumonia. EXAM: CT CHEST WITHOUT CONTRAST TECHNIQUE: Multidetector CT imaging of the chest was performed following the standard protocol without IV contrast. COMPARISON:  September 30, 2019. FINDINGS: Cardiovascular: Atherosclerosis of thoracic aorta is noted without aneurysm formation. Normal cardiac size. No pericardial effusion. Coronary artery calcifications are noted. Mediastinum/Nodes: Thyroid gland is unremarkable. No adenopathy is noted. Large sliding-type hiatal hernia is noted with a loop of transverse colon also extending into the hiatal hernia, but no bowel obstruction is noted. Lungs/Pleura: No pneumothorax or pleural effusion is noted. Left lung is clear. Mild right basilar subsegmental atelectasis is noted. Upper Abdomen: No acute abnormality. Musculoskeletal: No chest wall mass or suspicious bone lesions identified. IMPRESSION: 1. Coronary artery calcifications are noted suggesting coronary artery disease. 2. Large sliding-type hiatal hernia is noted with a loop of transverse colon also extending into the hiatal hernia, but no bowel obstruction is noted. 3. Mild right basilar subsegmental atelectasis is noted. 4. Aortic atherosclerosis. Aortic Atherosclerosis (ICD10-I70.0). Electronically Signed   By: Marijo Conception M.D.   On: 01/13/2021 20:26   DG Chest Port 1 View  Result Date: 01/13/2021 CLINICAL DATA:  Altered mental status and shortness of breath. EXAM: PORTABLE CHEST 1 VIEW COMPARISON:  10/15/2020 FINDINGS: Rotated exam. Large hiatal hernia partially distorts mediastinal contours. This is grossly unchanged allowing for differences in rotation. Planted loop recorder projects over the left chest wall. Suspected small pleural effusions, right greater than left. Chronic interstitial coarsening. No pulmonary  edema or pneumothorax. Chronic change of the right shoulder. IMPRESSION: 1. Suspected small pleural effusions, right greater than left. 2. Large hiatal hernia. Aortic Atherosclerosis (ICD10-I70.0). Electronically Signed   By: Keith Rake M.D.   On: 01/13/2021 16:17   Scheduled Meds: . apixaban  2.5 mg Oral BID  . cariprazine  3 mg Oral Daily  .  carvedilol  3.125 mg Oral BID  . escitalopram  20 mg Oral Daily  . ezetimibe  10 mg Oral Daily  . latanoprost  1 drop Both Eyes QHS  . levothyroxine  100 mcg Oral QAC breakfast  . pantoprazole  40 mg Oral Daily   Continuous Infusions: . cefTRIAXone (ROCEPHIN)  IV 1 g (01/15/21 0826)     LOS: 2 days    Time spent: 25 mins    Karnisha Lefebre, MD [facility]   If 7PM-7AM, please contact night-coverage

## 2021-01-15 NOTE — TOC Initial Note (Signed)
Transition of Care [facility]) - Initial/Assessment Note    Patient Details  Name: Alyssa Odonnell MRN: [id number] Date of Birth: February 06, 1934  Transition of Care [facility]) CM/SW Contact:    Angelita Ingles, RN Phone Number: [phone]  01/15/2021, 11:17 AM  Clinical Narrative:                 Patient admitted with AMS from [facility]. Patient to return to [facility], urine cultures pending. CM called Miquel Dunn and verified that patient is ok to return once medically stable. No other needs noted at this time [facility] will continue to follow for discharge back to [facility].    Expected Discharge Plan: Skilled Nursing Facility Barriers to Discharge: Continued Medical Work up   Patient Goals and CMS Choice   CMS Medicare.gov Compare Post Acute Care list provided to:: Other (Comment Required) (from [facility] and to return to [facility]) Choice offered to / list presented to : NA  Expected Discharge Plan and Services Expected Discharge Plan: [place] In-house Referral: NA Discharge Planning Services: CM Consult   Living arrangements for the past 2 months: [place]                 DME Arranged: N/A DME Agency: NA       HH Arranged: NA HH Agency: NA        Prior Living Arrangements/Services Living arrangements for the past 2 months: [place] Lives with:: Facility Resident              Current home services: Other (comment) (none)    Activities of Daily Living Home Assistive Devices/Equipment: Wheelchair ADL Screening (condition at time of admission) Patient's cognitive ability adequate to safely complete daily activities?: No Is the patient deaf or have difficulty hearing?: No Does the patient have difficulty seeing, even when wearing glasses/contacts?: No Does the patient have difficulty concentrating, remembering, or making decisions?: Yes Patient able to express need for assistance with ADLs?: Yes Does the patient have  difficulty dressing or bathing?: Yes Independently performs ADLs?: No Communication: Independent Dressing (OT): Needs assistance Is this a change from baseline?: Pre-admission baseline Grooming: Needs assistance Is this a change from baseline?: Pre-admission baseline Feeding: Needs assistance Is this a change from baseline?: Pre-admission baseline Bathing: Dependent Is this a change from baseline?: Pre-admission baseline Toileting: Dependent Is this a change from baseline?: Pre-admission baseline In/Out Bed: Dependent Is this a change from baseline?: Pre-admission baseline Walks in Home: Dependent Is this a change from baseline?: Pre-admission baseline Does the patient have difficulty walking or climbing stairs?: Yes Weakness of Legs: Both Weakness of Arms/Hands: Right  Permission Sought/Granted                  Emotional Assessment              Admission diagnosis:  Urinary tract infection without hematuria, site unspecified [N39.0] Altered mental status, unspecified altered mental status type [R41.82] AMS (altered mental status) [R41.82] Patient Active Problem List   Diagnosis Date Noted  . AMS (altered mental status) 01/13/2021  . Acute ischemic stroke ([place]) 10/15/2020  . Elevated INR 10/15/2020  . Adjustment disorder with depressed mood 07/22/2020  . Dementia with behavioral disturbance ([place])   . Open fracture of right zygomatic arch ([place])   . Palliative care by specialist   . DNR (do not resuscitate)   . Malnutrition of moderate degree 10/02/2019  . Pressure injury of skin 10/01/2019  . Malnutrition ([place])  10/01/2019  . Hypothyroid 10/01/2019  . Failure to thrive in adult 09/30/2019  . Pleural effusion, bilateral 09/30/2019  . Chronic anticoagulation 09/30/2019  . 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 Gastric outlet obstruction 08/29/2019  . PAF (paroxysmal atrial fibrillation) ([place]) 02/02/2018  . 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[place]) 05/24/2017  . Cerebrovascular  accident (CVA) due to embolism of left posterior cerebral artery ([place]) 03/09/2017  . htn 03/09/2017  . Generalized weakness 03/09/2017  . Stroke-like symptom 03/09/2017  . Right sided weakness   . PAD (peripheral artery disease) ([place])   . Essential hypertension   . Spinal stenosis of lumbar region   . Pure hypercholesterolemia   . Recurrent major depressive disorder ([place])    PCP:  Jenel Lucks, PA-C Pharmacy:   [facility], [address], [address] [place], [place] [address] Phone: [phone] Fax: [place], [place] [address] [place] [address] Phone: [phone] Fax: [phone]     Social Determinants of Health (SDOH) Interventions    Readmission Risk Interventions Readmission Risk Prevention Plan 10/02/2019  Post Dischage Appt Not Complete  Appt Comments plan for SNF  Medication Screening Complete  Transportation Screening Complete  Some recent data might be hidden

## 2021-01-16 LAB — URINE CULTURE: Culture: >=100000 — AB

## 2021-01-16 LAB — RESP PANEL BY RT-PCR (FLU A&B, COVID) ARPGX2
Influenza A by PCR: NEGATIVE
Influenza B by PCR: NEGATIVE
SARS Coronavirus 2 by RT PCR: NEGATIVE

## 2021-01-16 LAB — BASIC METABOLIC PANEL
Anion gap: 7 (ref 5–15)
BUN: 15 mg/dL (ref 8–23)
CO2: 26 mmol/L (ref 22–32)
Calcium: 9.3 mg/dL (ref 8.9–10.3)
Chloride: 104 mmol/L (ref 98–111)
Creatinine, Ser: 1.1 mg/dL — ABNORMAL HIGH (ref 0.44–1.00)
GFR, Estimated: 50 mL/min — ABNORMAL LOW (ref >60)
Glucose, Bld: 95 mg/dL (ref 70–99)
Potassium: 3.8 mmol/L (ref 3.5–5.1)
Sodium: 137 mmol/L (ref 135–145)

## 2021-01-16 LAB — T4, FREE: Free T4: 1.08 ng/dL (ref 0.61–1.12)

## 2021-01-16 LAB — TSH: TSH: 4.362 u[IU]/mL (ref 0.350–4.500)

## 2021-01-16 NOTE — Care Management Important Message (Signed)
Important Message  Patient Details  Name: Alyssa Odonnell MRN: [id number] Date of Birth: Jul 09, 1934   Medicare Important Message Given:  Yes     Rethel Sebek 01/16/2021, 1:20 PM

## 2021-01-16 NOTE — NC FL2 (Signed)
[place] LEVEL OF CARE SCREENING TOOL     IDENTIFICATION  Patient Name: Alyssa Odonnell Birthdate: 07-26-1934 Sex: female Admission Date (Current Location): 01/13/2021  [facility] and [place] Number:  Herbalist and Address:  [facility] [place], [place] [address] [place], [place] [address]      Provider Number: [id number]  Attending Physician Name and Address:  Shawna Clamp, MD  Relative Name and Phone Number:  [name] [phone]    Current Level of Care: Hospital Recommended Level of Care: [place] Prior Approval Number:    Date Approved/Denied:   PASRR Number: [id number]  Discharge Plan: SNF    Current Diagnoses: Patient Active Problem List   Diagnosis Date Noted  . AMS (altered mental status) 01/13/2021  . Acute ischemic stroke ([place]) 10/15/2020  . Elevated INR 10/15/2020  . Adjustment disorder with depressed mood 07/22/2020  . Dementia with behavioral disturbance ([place])   . Open fracture of right zygomatic arch ([place])   . Palliative care by specialist   . DNR (do not resuscitate)   . Malnutrition of moderate degree 10/02/2019  . Pressure injury of skin 10/01/2019  . Malnutrition ([place]) 10/01/2019  . Hypothyroid 10/01/2019  . Failure to thrive in adult 09/30/2019  . Pleural effusion, bilateral 09/30/2019  . Chronic anticoagulation 09/30/2019  . 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 Gastric outlet obstruction 08/29/2019  . PAF (paroxysmal atrial fibrillation) ([place]) 02/02/2018  . 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[place]) 05/24/2017  . Cerebrovascular accident (CVA) due to embolism of left posterior cerebral artery ([place]) 03/09/2017  . htn 03/09/2017  . Generalized weakness 03/09/2017  . Stroke-like symptom 03/09/2017  . Right sided weakness   . PAD (peripheral artery disease) ([place])   . Essential hypertension   . Spinal stenosis of lumbar region   . Pure hypercholesterolemia   . Recurrent major depressive disorder  (HCC)     Orientation RESPIRATION BLADDER Height & Weight     Self  Normal Incontinent Weight: 59.4 kg Height:  5\' 3"  (160 cm)  BEHAVIORAL SYMPTOMS/MOOD NEUROLOGICAL BOWEL NUTRITION STATUS     (n/a) Continent Diet (Heart Healthy)  AMBULATORY STATUS COMMUNICATION OF NEEDS Skin   Total Care (wheelchair with 2 assist transfer) Verbally PU Stage and Appropriate Care PU Stage 1 Dressing: No Dressing                     Personal Care Assistance Level of Assistance  Bathing,Feeding,Dressing Bathing Assistance: Limited assistance Feeding assistance: Limited assistance Dressing Assistance: Limited assistance     Functional Limitations Info  Sight,Hearing,Speech Sight Info: Adequate Hearing Info: Adequate Speech Info: Adequate    SPECIAL CARE FACTORS FREQUENCY  PT (By licensed PT),OT (By licensed OT)     PT Frequency: 5X/weekly OT Frequency: 5X/ weekly            Contractures Contractures Info: Not present    Additional Factors Info  Code Status,Allergies,Psychotropic,Insulin Sliding Scale,Isolation Precautions,Suctioning Needs Code Status Info: DNR Allergies Info: Aspirin, Atorvastatin, Nabumetone, Nsaids, Pravastatin, Procaine, Rosuvastatin, Simvastatin, Statins, Evolocumab, Mobic Meloxicam, Penicillins Psychotropic Info: Ativan as needed, Lexapro daily Insulin Sliding Scale Info: see discharge summary for sliding scale information Isolation Precautions Info: n/a Suctioning Needs: n/a   Current Medications (01/16/2021):  This is the current hospital active medication list Current Facility-Administered Medications  Medication Dose Route Frequency Provider Last Rate Last Admin  . acetaminophen (TYLENOL) tablet 650 mg  650 mg Oral Q6H PRN Lequita Halt, MD      .  apixaban (ELIQUIS) tablet 2.5 mg  2.5 mg Oral BID Pham, Minh Q, RPH-CPP   2.5 mg at 01/15/21 2132  . cariprazine (VRAYLAR) capsule 3 mg  3 mg Oral Daily Wynetta Fines T, MD   3 mg at 01/15/21 4098  . carvedilol  (COREG) tablet 3.125 mg  3.125 mg Oral BID Wynetta Fines T, MD   3.125 mg at 01/15/21 2132  . cefTRIAXone (ROCEPHIN) 1 g in sodium chloride 0.9 % 100 mL IVPB  1 g Intravenous Q24H Wynetta Fines T, MD 200 mL/hr at 01/16/21 0854 1 g at 01/16/21 0854  . escitalopram (LEXAPRO) tablet 20 mg  20 mg Oral Daily Wynetta Fines T, MD   20 mg at 01/15/21 1191  . ezetimibe (ZETIA) tablet 10 mg  10 mg Oral Daily Wynetta Fines T, MD   10 mg at 01/15/21 4782  . latanoprost (XALATAN) 0.005 % ophthalmic solution 1 drop  1 drop Both Eyes QHS Lequita Halt, MD   1 drop at 01/15/21 2133  . levothyroxine (SYNTHROID) tablet 100 mcg  100 mcg Oral QAC breakfast Wynetta Fines T, MD   100 mcg at 01/16/21 9562  . LORazepam (ATIVAN) tablet 0.5 mg  0.5 mg Oral BID PRN Wynetta Fines T, MD   0.5 mg at 01/15/21 2132  . pantoprazole (PROTONIX) EC tablet 40 mg  40 mg Oral Daily Wynetta Fines T, MD   40 mg at 01/15/21 1308  . senna-docusate (Senokot-S) tablet 1 tablet  1 tablet Oral QHS PRN Wynetta Fines T, MD      . traMADol Veatrice Bourbon) tablet 50-100 mg  50-100 mg Oral Q12H PRN Wynetta Fines T, MD      . Zinc Oxide (TRIPLE PASTE) 12.8 % ointment   Topical PRN Barrie Folk, PA-C         Discharge Medications: Please see discharge summary for a list of discharge medications.  Relevant Imaging Results:  Relevant Lab Results:   Additional Information SS# [id number]  Angelita Ingles, RN

## 2021-01-16 NOTE — Progress Notes (Signed)
PROGRESS NOTE    Alyssa Odonnell  TIR:[id number] DOB: [birth year] DOA: 01/13/2021 PCP: Jenel Lucks, PA-C   Brief Narrative:  This 87 years old female with PMH significant for recent CVA with residual right-sided weakness, advanced dementia, A. fib on Eliquis, hypertension presented in the ED with altered mental status.  Patient is unable to provide history, most of the history is obtained from patient's son at bedside.  Son reports patient does have intermittent episodes of confusion and agitation at baseline.  She was found poorly responsive by the nursing staff at the nursing home, Patient was found to have a temp of 99.7.  Patient is brought in the ED, CT head negative for acute abnormality,  UA consistent with UTI, chest x-ray shows some infiltrate but CT chest ruled out pneumonia. Patient is admitted with altered mental status secondary to UTI.  Urine culture grew Citrobacter Koseri,  started on antibiotics.   Assessment & Plan:   Active Problems:   AMS (altered mental status)   Altered mental status could be secondary to UTI: > Improving Patient was found poorly responsive, temp of 99.7 at nursing home. UA does show 21-50 WBC/hpf, but looking back on pt's urine culture in the past, two occasions in 07/2020 and 09/2020 showed mixed flora, raised the possibility of colonization rather than real infection.  Her Chest xray today showed a RLL infiltrate vs consolidation, although son does not remember pt has aspiration events in patient's past.  CT chest- Coronary artery calcifications suggesting coronary artery disease. Large sliding-type hiatal hernia but no bowel obstruction is noted. Ammonia level normal, TSH 5.7 EEG completed: No evidence of seizures. Patient was given ceftriaxone,  will continue ceftriaxone for 3 days. Urine culture grew Citrobacter koseri, sensitivity pending Patient seems back to her baseline mental status. PT and OT evaluation : SNF  Proxysmal  Atrial fibrillation: -Remains in sinus rhythm, CT head negative for bleeding, continue Eliquis.  Baseline dementia with periodic agitation: -Suspect Sundowning, son not sure why patient not on dementia meds -Continue PRN Halodol for 2 days.  HTN -Controlled, continue home BP regimen.  Recent stroke with residue right sided weakness -Strength appears to be at baseline. -PT evaluation.   DVT prophylaxis:Eliquis Code Status: DNR Family Communication:  No family at bed side. Disposition Plan:  Status is: Inpatient  Remains inpatient appropriate because:Inpatient level of care appropriate due to severity of illness   Dispo: The patient is from: Home              Anticipated d/c is to: SNF 4/1              Patient currently is not medically stable to d/c.   Difficult to place patient No   Consultants:   Neurology  Procedures: EEG  Antimicrobials:  Anti-infectives (From admission, onward)   Start     Dose/Rate Route Frequency Ordered Stop   01/14/21 1000  cefTRIAXone (ROCEPHIN) 1 g in sodium chloride 0.9 % 100 mL IVPB        1 g 200 mL/hr over 30 Minutes Intravenous Every 24 hours 01/13/21 1857     01/13/21 1745  cefTRIAXone (ROCEPHIN) 1 g in sodium chloride 0.9 % 100 mL IVPB        1 g 200 mL/hr over 30 Minutes Intravenous  Once 01/13/21 1737 01/13/21 1931      Subjective: Patient was seen and examined at bedside.  Overnight events noted.   Patient seems alert and oriented x 2, appears like  at her baseline mental status.   Denies any chest pain or shortness of breath but reports having urinary symptoms.  Objective: Vitals:   01/15/21 1157 01/15/21 2100 01/16/21 0733 01/16/21 1347  BP: 106/83 (!) 141/87 139/74 129/77  Pulse: 86   88  Resp: 14  16 16   Temp: 98.1 F (36.7 C) 97.6 F (36.4 C) 97.9 F (36.6 C)   TempSrc:  Oral Axillary   SpO2:   96% 97%  Weight:      Height:        Intake/Output Summary (Last 24 hours) at 01/16/2021 1347 Last data filed at  01/15/2021 1842 Gross per 24 hour  Intake --  Output 550 ml  Net -550 ml   Filed Weights   01/14/21 1056  Weight: 59.4 kg    Examination:  General exam: Appears calm and comfortable, not in any acute distress Respiratory system: Clear to auscultation. Respiratory effort normal. Cardiovascular system: S1 & S2 heard, RRR. No JVD, murmurs, rubs, gallops or clicks. No pedal edema. Gastrointestinal system: Abdomen is nondistended, soft and nontender. No organomegaly or masses felt.  Normal bowel sounds heard. Central nervous system: Alert and oriented. No focal neurological deficits. Extremities: Symmetric 5 x 5 power.  Partial right-sided weakness noted Skin: No rashes, lesions or ulcers Psychiatry: Judgement and insight appear normal. Mood & affect appropriate.     Data Reviewed: I have personally reviewed following labs and imaging studies  CBC: Recent Labs  Lab 01/13/21 1542 01/13/21 1547 01/14/21 0332 01/15/21 0251  WBC 9.4  --  9.3 8.1  NEUTROABS 5.8  --   --   --   HGB 14.9 15.3* 12.7 11.4*  HCT 46.3* 45.0 38.7 34.3*  MCV 93.7  --  92.6 91.7  PLT 224  --  215 660   Basic Metabolic Panel: Recent Labs  Lab 01/13/21 1542 01/13/21 1547 01/15/21 0251 01/16/21 0225  NA 136 138 135 137  K 4.2 5.4* 3.3* 3.8  CL 104  --  104 104  CO2 23  --  23 26  GLUCOSE 107*  --  85 95  BUN 22  --  18 15  CREATININE 1.16*  --  1.06* 1.10*  CALCIUM 9.6  --  8.9 9.3  MG  --   --  1.9  --   PHOS  --   --  3.3  --    GFR: Estimated Creatinine Clearance: 31.5 mL/min (A) (by C-G formula based on SCr of 1.1 mg/dL (H)). Liver Function Tests: Recent Labs  Lab 01/13/21 1542  AST 32  ALT 29  ALKPHOS 74  BILITOT 0.6  PROT 6.7  ALBUMIN 3.5   No results for input(s): LIPASE, AMYLASE in the last 168 hours. Recent Labs  Lab 01/13/21 1900  AMMONIA 28   Coagulation Profile: No results for input(s): INR, PROTIME in the last 168 hours. Cardiac Enzymes: No results for input(s):  CKTOTAL, CKMB, CKMBINDEX, TROPONINI in the last 168 hours. BNP (last 3 results) No results for input(s): PROBNP in the last 8760 hours. HbA1C: No results for input(s): HGBA1C in the last 72 hours. CBG: Recent Labs  Lab 01/13/21 1713 01/15/21 1657  GLUCAP 92 100*   Lipid Profile: No results for input(s): CHOL, HDL, LDLCALC, TRIG, CHOLHDL, LDLDIRECT in the last 72 hours. Thyroid Function Tests: Recent Labs    01/16/21 0225  TSH 4.362  FREET4 1.08   Anemia Panel: Recent Labs    01/14/21 1110  VITAMINB12 652   Sepsis Labs:  Recent Labs  Lab 01/13/21 1543  LATICACIDVEN 1.9    Recent Results (from the past 240 hour(s))  Blood Cultures (routine x 2)     Status: None (Preliminary result)   Collection Time: 01/13/21  3:43 PM   Specimen: BLOOD LEFT ARM  Result Value Ref Range Status   Specimen Description BLOOD LEFT ARM  Final   Special Requests   Final    BOTTLES DRAWN AEROBIC AND ANAEROBIC Blood Culture results may not be optimal due to an inadequate volume of blood received in culture bottles   Culture   Final    NO GROWTH 3 DAYS Performed at [facility], [place] [address] [place], [place] [address]    Report Status PENDING  Incomplete  Urine culture     Status: Abnormal   Collection Time: 01/13/21  3:43 PM   Specimen: Urine, Random  Result Value Ref Range Status   Specimen Description URINE, RANDOM  Final   Special Requests   Final    NONE Performed at [facility], [place] [address], [place], [place] [address]    Culture >=100,000 COLONIES/mL CITROBACTER KOSERI (A)  Final   Report Status 01/16/2021 FINAL  Final   Organism ID, Bacteria CITROBACTER KOSERI (A)  Final      Susceptibility   Citrobacter koseri - MIC*    CEFAZOLIN <=4 SENSITIVE Sensitive     CEFEPIME <=0.12 SENSITIVE Sensitive     CEFTRIAXONE <=0.25 SENSITIVE Sensitive     CIPROFLOXACIN <=0.25 SENSITIVE Sensitive     GENTAMICIN <=1 SENSITIVE Sensitive     IMIPENEM <=0.25 SENSITIVE  Sensitive     NITROFURANTOIN 32 SENSITIVE Sensitive     TRIMETH/SULFA <=20 SENSITIVE Sensitive     PIP/TAZO <=4 SENSITIVE Sensitive     * >=100,000 COLONIES/mL CITROBACTER KOSERI  Resp Panel by RT-PCR (Flu A&B, Covid) Nasopharyngeal Swab     Status: None   Collection Time: 01/13/21  3:44 PM   Specimen: Nasopharyngeal Swab; Nasopharyngeal(NP) swabs in vial transport medium  Result Value Ref Range Status   SARS Coronavirus 2 by RT PCR NEGATIVE NEGATIVE Final    Comment: (NOTE) SARS-CoV-2 target nucleic acids are NOT DETECTED.  The SARS-CoV-2 RNA is generally detectable in upper respiratory specimens during the acute phase of infection. The lowest concentration of SARS-CoV-2 viral copies this assay can detect is 138 copies/mL. A negative result does not preclude SARS-Cov-2 infection and should not be used as the sole basis for treatment or other patient management decisions. A negative result may occur with  improper specimen collection/handling, submission of specimen other than nasopharyngeal swab, presence of viral mutation(s) within the areas targeted by this assay, and inadequate number of viral copies(<138 copies/mL). A negative result must be combined with clinical observations, patient history, and epidemiological information. The expected result is Negative.  Fact Sheet for Patients:  EntrepreneurPulse.com.au  Fact Sheet for Healthcare Providers:  IncredibleEmployment.be  This test is no t yet approved or cleared by the [place] FDA and  has been authorized for detection and/or diagnosis of SARS-CoV-2 by FDA under an Emergency Use Authorization (EUA). This EUA will remain  in effect (meaning this test can be used) for the duration of the COVID-19 declaration under Section 564(b)(1) of the Act, 21 U.S.C.section 360bbb-3(b)(1), unless the authorization is terminated  or revoked sooner.       Influenza A by PCR NEGATIVE NEGATIVE  Final   Influenza B by PCR NEGATIVE NEGATIVE Final    Comment: (NOTE) The  Xpert Xpress SARS-CoV-2/FLU/RSV plus assay is intended as an aid in the diagnosis of influenza from Nasopharyngeal swab specimens and should not be used as a sole basis for treatment. Nasal washings and aspirates are unacceptable for Xpert Xpress SARS-CoV-2/FLU/RSV testing.  Fact Sheet for Patients: EntrepreneurPulse.com.au  Fact Sheet for Healthcare Providers: IncredibleEmployment.be  This test is not yet approved or cleared by the [place] FDA and has been authorized for detection and/or diagnosis of SARS-CoV-2 by FDA under an Emergency Use Authorization (EUA). This EUA will remain in effect (meaning this test can be used) for the duration of the COVID-19 declaration under Section 564(b)(1) of the Act, 21 U.S.C. section 360bbb-3(b)(1), unless the authorization is terminated or revoked.  Performed at [facility], [place] [address] [place], [place] [address]   Blood Cultures (routine x 2)     Status: None (Preliminary result)   Collection Time: 01/13/21  3:48 PM   Specimen: BLOOD RIGHT ARM  Result Value Ref Range Status   Specimen Description BLOOD RIGHT ARM  Final   Special Requests   Final    BOTTLES DRAWN AEROBIC AND ANAEROBIC Blood Culture results may not be optimal due to an inadequate volume of blood received in culture bottles   Culture   Final    NO GROWTH 3 DAYS Performed at [facility], [place] [address], [place], [place] [address]    Report Status PENDING  Incomplete    Radiology Studies: No results found. Scheduled Meds: . apixaban  2.5 mg Oral BID  . cariprazine  3 mg Oral Daily  . carvedilol  3.125 mg Oral BID  . escitalopram  20 mg Oral Daily  . ezetimibe  10 mg Oral Daily  . latanoprost  1 drop Both Eyes QHS  . levothyroxine  100 mcg Oral QAC breakfast  . pantoprazole  40 mg Oral Daily   Continuous Infusions: . cefTRIAXone  (ROCEPHIN)  IV 1 g (01/16/21 0854)     LOS: 3 days    Time spent: 25 mins    Morelia Cassells, MD [facility]   If 7PM-7AM, please contact night-coverage

## 2021-01-17 DIAGNOSIS — R2681 Unsteadiness on feet: Secondary | ICD-10-CM | POA: Diagnosis not present

## 2021-01-17 DIAGNOSIS — I69351 Hemiplegia and hemiparesis following cerebral infarction affecting right dominant side: Secondary | ICD-10-CM | POA: Diagnosis not present

## 2021-01-17 DIAGNOSIS — M6281 Muscle weakness (generalized): Secondary | ICD-10-CM | POA: Diagnosis not present

## 2021-01-17 DIAGNOSIS — M48061 Spinal stenosis, lumbar region without neurogenic claudication: Secondary | ICD-10-CM | POA: Diagnosis not present

## 2021-01-17 DIAGNOSIS — I480 Paroxysmal atrial fibrillation: Secondary | ICD-10-CM | POA: Diagnosis not present

## 2021-01-17 DIAGNOSIS — I639 Cerebral infarction, unspecified: Secondary | ICD-10-CM | POA: Diagnosis not present

## 2021-01-17 DIAGNOSIS — K311 Adult hypertrophic pyloric stenosis: Secondary | ICD-10-CM | POA: Diagnosis not present

## 2021-01-17 DIAGNOSIS — G9341 Metabolic encephalopathy: Secondary | ICD-10-CM | POA: Diagnosis not present

## 2021-01-17 DIAGNOSIS — Z743 Need for continuous supervision: Secondary | ICD-10-CM | POA: Diagnosis not present

## 2021-01-17 DIAGNOSIS — I70209 Unspecified atherosclerosis of native arteries of extremities, unspecified extremity: Secondary | ICD-10-CM | POA: Diagnosis not present

## 2021-01-17 DIAGNOSIS — R1312 Dysphagia, oropharyngeal phase: Secondary | ICD-10-CM | POA: Diagnosis not present

## 2021-01-17 DIAGNOSIS — R5381 Other malaise: Secondary | ICD-10-CM | POA: Diagnosis not present

## 2021-01-17 DIAGNOSIS — E039 Hypothyroidism, unspecified: Secondary | ICD-10-CM | POA: Diagnosis not present

## 2021-01-17 DIAGNOSIS — K219 Gastro-esophageal reflux disease without esophagitis: Secondary | ICD-10-CM | POA: Diagnosis not present

## 2021-01-17 DIAGNOSIS — N390 Urinary tract infection, site not specified: Principal | ICD-10-CM

## 2021-01-17 DIAGNOSIS — E7800 Pure hypercholesterolemia, unspecified: Secondary | ICD-10-CM | POA: Diagnosis not present

## 2021-01-17 DIAGNOSIS — R278 Other lack of coordination: Secondary | ICD-10-CM | POA: Diagnosis not present

## 2021-01-17 DIAGNOSIS — G8191 Hemiplegia, unspecified affecting right dominant side: Secondary | ICD-10-CM | POA: Diagnosis not present

## 2021-01-17 DIAGNOSIS — R279 Unspecified lack of coordination: Secondary | ICD-10-CM | POA: Diagnosis not present

## 2021-01-17 DIAGNOSIS — I739 Peripheral vascular disease, unspecified: Secondary | ICD-10-CM | POA: Diagnosis not present

## 2021-01-17 DIAGNOSIS — E440 Moderate protein-calorie malnutrition: Secondary | ICD-10-CM | POA: Diagnosis not present

## 2021-01-17 DIAGNOSIS — Z741 Need for assistance with personal care: Secondary | ICD-10-CM | POA: Diagnosis not present

## 2021-01-17 LAB — BASIC METABOLIC PANEL
Anion gap: 9 (ref 5–15)
BUN: 19 mg/dL (ref 8–23)
CO2: 23 mmol/L (ref 22–32)
Calcium: 9.2 mg/dL (ref 8.9–10.3)
Chloride: 103 mmol/L (ref 98–111)
Creatinine, Ser: 1.2 mg/dL — ABNORMAL HIGH (ref 0.44–1.00)
GFR, Estimated: 45 mL/min — ABNORMAL LOW (ref >60)
Glucose, Bld: 100 mg/dL — ABNORMAL HIGH (ref 70–99)
Potassium: 3.7 mmol/L (ref 3.5–5.1)
Sodium: 135 mmol/L (ref 135–145)

## 2021-01-17 LAB — HEMOGLOBIN AND HEMATOCRIT, BLOOD
HCT: 37.1 % (ref 36.0–46.0)
Hemoglobin: 12.3 g/dL (ref 12.0–15.0)

## 2021-01-17 MED ORDER — CEPHALEXIN 500 MG PO CAPS: 500.0000 mg | ORAL_CAPSULE | Freq: Two times a day (BID) | ORAL | 0 refills | Status: AC

## 2021-01-17 MED ORDER — APIXABAN 2.5 MG PO TABS: 2.5000 mg | ORAL_TABLET | Freq: Two times a day (BID) | ORAL | Status: AC

## 2021-01-17 MED ORDER — CARVEDILOL 3.125 MG PO TABS
3.1250 mg | ORAL_TABLET | Freq: Two times a day (BID) | ORAL | Status: AC
Start: 2021-01-17 — End: ?

## 2021-01-17 MED ORDER — LORAZEPAM 0.5 MG PO TABS: 0.5000 mg | ORAL_TABLET | Freq: Two times a day (BID) | ORAL | 0 refills | Status: AC | PRN

## 2021-01-17 NOTE — Discharge Summary (Signed)
Discharge Summary  Alyssa Odonnell:[id number] DOB: [birth year]  PCP: Jenel Lucks, PA-C  Admit date: 01/13/2021 Discharge date: 01/17/2021  Time spent: 35 mins  Recommendations for Outpatient Follow-up:  1. Follow-up with PCP  Discharge Diagnoses:  Active Hospital Problems   Diagnosis Date Noted  . AMS (altered mental status) 01/13/2021    Resolved Hospital Problems  No resolved problems to display.    Discharge Condition: Stable  Diet recommendation: As tolerated  Vitals:   01/16/21 2157 01/17/21 0648  BP: 124/72 135/77  Pulse: 97 85  Resp: 17 16  Temp: 98.4 F (36.9 C) 98.5 F (36.9 C)  SpO2: 97% 99%    History of present illness:  87 years old female with PMH significant for recent CVA with residual right-sided weakness, advanced dementia, A. fib on Eliquis, hypertension presented in the ED with altered mental status.  Patient is unable to provide history, most of the history is obtained from patient's son at bedside.  Son reports patient does have intermittent episodes of confusion and agitation at baseline.  She was found poorly responsive by the nursing staff at the nursing home, Patient was found to have a temp of 99.7.  Patient is brought in the ED, CT head negative for acute abnormality,  UA consistent with UTI, chest x-ray shows some infiltrate but CT chest ruled out pneumonia. Patient is admitted with altered mental status secondary to UTI.  Urine culture grew Citrobacter Koseri,  started on antibiotics.     Today, patient denies any new complaints.  Patient stable to be discharged back to SNF.    Hospital Course:  Principal Problem:   AMS (altered mental status)   Acute metabolic encephalopathy likely 2/2  Citrobacter Koseri UTI Improved Currently afebrile, with no leukocytosis Urine culture grew Citrobacter koseri CT chest- Coronary artery calcifications suggesting coronary artery disease. Large sliding-type hiatal hernia but no bowel  obstruction is noted. Ammonia level normal, TSH 4.36 EEG completed: No evidence of seizures. S/P ceftriaxone, d/c on PO keflex to complete 7 days (needs 3 more days)  Proxysmal Atrial fibrillation: Remains in sinus rhythm, CT head negative for bleeding, continue Eliquis.  Baseline dementia with periodic agitation: Continue PTA regimen  HTN Continue home BP regimen  Recent stroke with residue right sided weakness Strength appears to be at baseline. PT/OT    Estimated body mass index is 23.2 kg/m as calculated from the following:   Height as of this encounter: 5\' 3"  (1.6 m).   Weight as of this encounter: 59.4 kg.    Procedures:  None  Consultations:  Neurology  Discharge Exam: BP 135/77 (BP Location: Left Arm)   Pulse 85   Temp 98.5 F (36.9 C)   Resp 16   Ht 5\' 3"  (1.6 m)   Wt 59.4 kg   SpO2 99%   BMI 23.20 kg/m   General: NAD, alert, oriented X 2 Cardiovascular: S1, S2 present Respiratory: CTAB     Discharge Instructions You were cared for by a hospitalist during your hospital stay. If you have any questions about your discharge medications or the care you received while you were in the hospital after you are discharged, you can call the unit and asked to speak with the hospitalist on call if the hospitalist that took care of you is not available. Once you are discharged, your primary care physician will handle any further medical issues. Please note that NO REFILLS for any discharge medications will be authorized once you are discharged, as  it is imperative that you return to your primary care physician (or establish a relationship with a primary care physician if you do not have one) for your aftercare needs so that they can reassess your need for medications and monitor your lab values.  Discharge Instructions    Diet - low sodium heart healthy   Complete by: As directed    Discharge wound care:   Complete by: As directed    Pressure Injury 01/14/21  Sacrum Stage 1 -  Intact skin with non-blanchable redness of a localized area usually over a bony prominence. Unblanchable redness   Increase activity slowly   Complete by: As directed      Allergies as of 01/17/2021      Reactions   Aspirin Other (See Comments)   Red splotches   Atorvastatin Other (See Comments)   Muscle spams   Nabumetone Itching   Nsaids Other (See Comments)   Unknown reaction   Pravastatin Other (See Comments)   Stiff,  walking difficulty   Procaine Swelling   Rosuvastatin Nausea Only, Other (See Comments)   Reflux and Muscle stiffness   Simvastatin Nausea Only, Other (See Comments)   Reflux and Muscle stiffness   Statins Other (See Comments)   Difficulty walking, muscle spasms, reflux, muscle stiffness   Evolocumab Rash   Mobic [meloxicam] Rash   Penicillins Rash   Has patient had a PCN reaction causing immediate rash, facial/tongue/throat swelling, SOB or lightheadedness with hypotension: Yes Has patient had a PCN reaction causing severe rash involving mucus membranes or skin necrosis: No Has patient had a PCN reaction that required hospitalization: No Has patient had a PCN reaction occurring within the last 10 years: No If all of the above answers are "NO", then may proceed with Cephalosporin use.      Medication List    TAKE these medications   apixaban 2.5 MG Tabs tablet Commonly known as: Eliquis Take 1 tablet (2.5 mg total) by mouth 2 (two) times daily. What changed:   medication strength  how much to take   carvedilol 3.125 MG tablet Commonly known as: COREG Take 1 tablet (3.125 mg total) by mouth 2 (two) times daily.   cephALEXin 500 MG capsule Commonly known as: KEFLEX Take 1 capsule (500 mg total) by mouth 2 (two) times daily for 3 days.   escitalopram 20 MG tablet Commonly known as: LEXAPRO Take 20 mg by mouth daily.   ezetimibe 10 MG tablet Commonly known as: ZETIA Take 1 tablet (10 mg total) by mouth daily.   latanoprost  0.005 % ophthalmic solution Commonly known as: XALATAN Place 1 drop into both eyes at bedtime.   levothyroxine 100 MCG tablet Commonly known as: SYNTHROID Take 100 mcg by mouth daily before breakfast.   LORazepam 0.5 MG tablet Commonly known as: ATIVAN Take 1 tablet (0.5 mg total) by mouth 2 (two) times daily as needed for anxiety.   pantoprazole 40 MG tablet Commonly known as: PROTONIX Take 40 mg by mouth daily.   senna-docusate 8.6-50 MG tablet Commonly known as: Senokot-S Take 1 tablet by mouth at bedtime as needed for mild constipation.   traMADol 50 MG tablet Commonly known as: ULTRAM Take 1-2 tablets (50-100 mg total) by mouth every 6 (six) hours as needed for moderate pain or severe pain.   Vraylar capsule Generic drug: cariprazine Take 3 mg by mouth daily.            Discharge Care Instructions  (From admission, onward)  Start     Ordered   01/17/21 0000  Discharge wound care:       Comments: Pressure Injury 01/14/21 Sacrum Stage 1 -  Intact skin with non-blanchable redness of a localized area usually over a bony prominence. Unblanchable redness   01/17/21 1228         Allergies  Allergen Reactions  . Aspirin Other (See Comments)    Red splotches  . Atorvastatin Other (See Comments)    Muscle spams  . Nabumetone Itching  . Nsaids Other (See Comments)    Unknown reaction  . Pravastatin Other (See Comments)    Stiff,  walking difficulty   . Procaine Swelling  . Rosuvastatin Nausea Only and Other (See Comments)    Reflux and Muscle stiffness  . Simvastatin Nausea Only and Other (See Comments)    Reflux and Muscle stiffness  . Statins Other (See Comments)    Difficulty walking, muscle spasms, reflux, muscle stiffness  . Evolocumab Rash  . Mobic [Meloxicam] Rash  . Penicillins Rash    Has patient had a PCN reaction causing immediate rash, facial/tongue/throat swelling, SOB or lightheadedness with hypotension: Yes Has patient had a PCN  reaction causing severe rash involving mucus membranes or skin necrosis: No Has patient had a PCN reaction that required hospitalization: No Has patient had a PCN reaction occurring within the last 10 years: No If all of the above answers are "NO", then may proceed with Cephalosporin use.      Follow-up Information    Jenel Lucks, PA-C Follow up.   Specialty: Internal Medicine Contact information: [place] [address] [phone]        Evans Lance, MD .   Specialty: Cardiology Contact information: [phone] [address] [place] [address] [phone]                The results of significant diagnostics from this hospitalization (including imaging, microbiology, ancillary and laboratory) are listed below for reference.    Significant Diagnostic Studies: CT HEAD WO CONTRAST  Result Date: 01/13/2021 CLINICAL DATA:  Mental status change, unknown cause EXAM: CT HEAD WITHOUT CONTRAST TECHNIQUE: Contiguous axial images were obtained from the base of the skull through the vertex without intravenous contrast. COMPARISON:  Head CT 12/03/2020 FINDINGS: Brain: No hemorrhage. Stable degree of atrophy. Moderate to advanced chronic small vessel ischemia. Remote left occipital infarct. Remote bilateral basal gangliar lacunar infarcts. No evidence of acute ischemia. No subdural or extra-axial collection. No midline shift or mass effect. Vascular: Atherosclerosis of skullbase vasculature without hyperdense vessel or abnormal calcification. Skull: No fracture or focal lesion. Sinuses/Orbits: Right maxillary sinus mucosal thickening partially included. No acute findings. Other: None. IMPRESSION: 1. No acute intracranial abnormality. 2. Stable atrophy, chronic small vessel ischemia, and remote infarcts. Electronically Signed   By: Keith Rake M.D.   On: 01/13/2021 18:14   CT CHEST WO CONTRAST  Result Date: 01/13/2021 CLINICAL DATA:   Pneumonia. EXAM: CT CHEST WITHOUT CONTRAST TECHNIQUE: Multidetector CT imaging of the chest was performed following the standard protocol without IV contrast. COMPARISON:  September 30, 2019. FINDINGS: Cardiovascular: Atherosclerosis of thoracic aorta is noted without aneurysm formation. Normal cardiac size. No pericardial effusion. Coronary artery calcifications are noted. Mediastinum/Nodes: Thyroid gland is unremarkable. No adenopathy is noted. Large sliding-type hiatal hernia is noted with a loop of transverse colon also extending into the hiatal hernia, but no bowel obstruction is noted. Lungs/Pleura: No pneumothorax or pleural effusion is noted. Left  lung is clear. Mild right basilar subsegmental atelectasis is noted. Upper Abdomen: No acute abnormality. Musculoskeletal: No chest wall mass or suspicious bone lesions identified. IMPRESSION: 1. Coronary artery calcifications are noted suggesting coronary artery disease. 2. Large sliding-type hiatal hernia is noted with a loop of transverse colon also extending into the hiatal hernia, but no bowel obstruction is noted. 3. Mild right basilar subsegmental atelectasis is noted. 4. Aortic atherosclerosis. Aortic Atherosclerosis (ICD10-I70.0). Electronically Signed   By: Marijo Conception M.D.   On: 01/13/2021 20:26   DG Chest Port 1 View  Result Date: 01/13/2021 CLINICAL DATA:  Altered mental status and shortness of breath. EXAM: PORTABLE CHEST 1 VIEW COMPARISON:  10/15/2020 FINDINGS: Rotated exam. Large hiatal hernia partially distorts mediastinal contours. This is grossly unchanged allowing for differences in rotation. Planted loop recorder projects over the left chest wall. Suspected small pleural effusions, right greater than left. Chronic interstitial coarsening. No pulmonary edema or pneumothorax. Chronic change of the right shoulder. IMPRESSION: 1. Suspected small pleural effusions, right greater than left. 2. Large hiatal hernia. Aortic Atherosclerosis  (ICD10-I70.0). Electronically Signed   By: Keith Rake M.D.   On: 01/13/2021 16:17    Microbiology: Recent Results (from the past 240 hour(s))  Blood Cultures (routine x 2)     Status: None (Preliminary result)   Collection Time: 01/13/21  3:43 PM   Specimen: BLOOD LEFT ARM  Result Value Ref Range Status   Specimen Description BLOOD LEFT ARM  Final   Special Requests   Final    BOTTLES DRAWN AEROBIC AND ANAEROBIC Blood Culture results may not be optimal due to an inadequate volume of blood received in culture bottles   Culture   Final    NO GROWTH 3 DAYS Performed at [facility], [place] [address], [place], [place] [address]    Report Status PENDING  Incomplete  Urine culture     Status: Abnormal   Collection Time: 01/13/21  3:43 PM   Specimen: Urine, Random  Result Value Ref Range Status   Specimen Description URINE, RANDOM  Final   Special Requests   Final    NONE Performed at [facility], [place] [address] [place], [place] [address]    Culture >=100,000 COLONIES/mL CITROBACTER KOSERI (A)  Final   Report Status 01/16/2021 FINAL  Final   Organism ID, Bacteria CITROBACTER KOSERI (A)  Final      Susceptibility   Citrobacter koseri - MIC*    CEFAZOLIN <=4 SENSITIVE Sensitive     CEFEPIME <=0.12 SENSITIVE Sensitive     CEFTRIAXONE <=0.25 SENSITIVE Sensitive     CIPROFLOXACIN <=0.25 SENSITIVE Sensitive     GENTAMICIN <=1 SENSITIVE Sensitive     IMIPENEM <=0.25 SENSITIVE Sensitive     NITROFURANTOIN 32 SENSITIVE Sensitive     TRIMETH/SULFA <=20 SENSITIVE Sensitive     PIP/TAZO <=4 SENSITIVE Sensitive     * >=100,000 COLONIES/mL CITROBACTER KOSERI  Resp Panel by RT-PCR (Flu A&B, Covid) Nasopharyngeal Swab     Status: None   Collection Time: 01/13/21  3:44 PM   Specimen: Nasopharyngeal Swab; Nasopharyngeal(NP) swabs in vial transport medium  Result Value Ref Range Status   SARS Coronavirus 2 by RT PCR NEGATIVE NEGATIVE Final    Comment: (NOTE) SARS-CoV-2 target  nucleic acids are NOT DETECTED.  The SARS-CoV-2 RNA is generally detectable in upper respiratory specimens during the acute phase of infection. The lowest concentration of SARS-CoV-2 viral copies this assay can detect is 138 copies/mL. A  negative result does not preclude SARS-Cov-2 infection and should not be used as the sole basis for treatment or other patient management decisions. A negative result may occur with  improper specimen collection/handling, submission of specimen other than nasopharyngeal swab, presence of viral mutation(s) within the areas targeted by this assay, and inadequate number of viral copies(<138 copies/mL). A negative result must be combined with clinical observations, patient history, and epidemiological information. The expected result is Negative.  Fact Sheet for Patients:  EntrepreneurPulse.com.au  Fact Sheet for Healthcare Providers:  IncredibleEmployment.be  This test is no t yet approved or cleared by the [place] FDA and  has been authorized for detection and/or diagnosis of SARS-CoV-2 by FDA under an Emergency Use Authorization (EUA). This EUA will remain  in effect (meaning this test can be used) for the duration of the COVID-19 declaration under Section 564(b)(1) of the Act, 21 U.S.C.section 360bbb-3(b)(1), unless the authorization is terminated  or revoked sooner.       Influenza A by PCR NEGATIVE NEGATIVE Final   Influenza B by PCR NEGATIVE NEGATIVE Final    Comment: (NOTE) The Xpert Xpress SARS-CoV-2/FLU/RSV plus assay is intended as an aid in the diagnosis of influenza from Nasopharyngeal swab specimens and should not be used as a sole basis for treatment. Nasal washings and aspirates are unacceptable for Xpert Xpress SARS-CoV-2/FLU/RSV testing.  Fact Sheet for Patients: EntrepreneurPulse.com.au  Fact Sheet for Healthcare  Providers: IncredibleEmployment.be  This test is not yet approved or cleared by the [place] FDA and has been authorized for detection and/or diagnosis of SARS-CoV-2 by FDA under an Emergency Use Authorization (EUA). This EUA will remain in effect (meaning this test can be used) for the duration of the COVID-19 declaration under Section 564(b)(1) of the Act, 21 U.S.C. section 360bbb-3(b)(1), unless the authorization is terminated or revoked.  Performed at [facility], [place] [address], [place], [place] [address]   Blood Cultures (routine x 2)     Status: None (Preliminary result)   Collection Time: 01/13/21  3:48 PM   Specimen: BLOOD RIGHT ARM  Result Value Ref Range Status   Specimen Description BLOOD RIGHT ARM  Final   Special Requests   Final    BOTTLES DRAWN AEROBIC AND ANAEROBIC Blood Culture results may not be optimal due to an inadequate volume of blood received in culture bottles   Culture   Final    NO GROWTH 3 DAYS Performed at [facility], [place] [address], [place], [place] [address]    Report Status PENDING  Incomplete  Resp Panel by RT-PCR (Flu A&B, Covid) Nasopharyngeal Swab     Status: None   Collection Time: 01/16/21  6:22 PM   Specimen: Nasopharyngeal Swab; Nasopharyngeal(NP) swabs in vial transport medium  Result Value Ref Range Status   SARS Coronavirus 2 by RT PCR NEGATIVE NEGATIVE Final    Comment: (NOTE) SARS-CoV-2 target nucleic acids are NOT DETECTED.  The SARS-CoV-2 RNA is generally detectable in upper respiratory specimens during the acute phase of infection. The lowest concentration of SARS-CoV-2 viral copies this assay can detect is 138 copies/mL. A negative result does not preclude SARS-Cov-2 infection and should not be used as the sole basis for treatment or other patient management decisions. A negative result may occur with  improper specimen collection/handling, submission of specimen other than nasopharyngeal  swab, presence of viral mutation(s) within the areas targeted by this assay, and inadequate number of viral copies(<138 copies/mL). A negative result must be  combined with clinical observations, patient history, and epidemiological information. The expected result is Negative.  Fact Sheet for Patients:  EntrepreneurPulse.com.au  Fact Sheet for Healthcare Providers:  IncredibleEmployment.be  This test is no t yet approved or cleared by the [place] FDA and  has been authorized for detection and/or diagnosis of SARS-CoV-2 by FDA under an Emergency Use Authorization (EUA). This EUA will remain  in effect (meaning this test can be used) for the duration of the COVID-19 declaration under Section 564(b)(1) of the Act, 21 U.S.C.section 360bbb-3(b)(1), unless the authorization is terminated  or revoked sooner.       Influenza A by PCR NEGATIVE NEGATIVE Final   Influenza B by PCR NEGATIVE NEGATIVE Final    Comment: (NOTE) The Xpert Xpress SARS-CoV-2/FLU/RSV plus assay is intended as an aid in the diagnosis of influenza from Nasopharyngeal swab specimens and should not be used as a sole basis for treatment. Nasal washings and aspirates are unacceptable for Xpert Xpress SARS-CoV-2/FLU/RSV testing.  Fact Sheet for Patients: EntrepreneurPulse.com.au  Fact Sheet for Healthcare Providers: IncredibleEmployment.be  This test is not yet approved or cleared by the [place] FDA and has been authorized for detection and/or diagnosis of SARS-CoV-2 by FDA under an Emergency Use Authorization (EUA). This EUA will remain in effect (meaning this test can be used) for the duration of the COVID-19 declaration under Section 564(b)(1) of the Act, 21 U.S.C. section 360bbb-3(b)(1), unless the authorization is terminated or revoked.  Performed at [facility], [place] [address] [place], [place] [address]       Labs: Basic Metabolic Panel: Recent Labs  Lab 01/13/21 1542 01/13/21 1547 01/15/21 0251 01/16/21 0225 01/17/21 0310  NA 136 138 135 137 135  K 4.2 5.4* 3.3* 3.8 3.7  CL 104  --  104 104 103  CO2 23  --  23 26 23   GLUCOSE 107*  --  85 95 100*  BUN 22  --  18 15 19   CREATININE 1.16*  --  1.06* 1.10* 1.20*  CALCIUM 9.6  --  8.9 9.3 9.2  MG  --   --  1.9  --   --   PHOS  --   --  3.3  --   --    Liver Function Tests: Recent Labs  Lab 01/13/21 1542  AST 32  ALT 29  ALKPHOS 74  BILITOT 0.6  PROT 6.7  ALBUMIN 3.5   No results for input(s): LIPASE, AMYLASE in the last 168 hours. Recent Labs  Lab 01/13/21 1900  AMMONIA 28   CBC: Recent Labs  Lab 01/13/21 1542 01/13/21 1547 01/14/21 0332 01/15/21 0251 01/17/21 0310  WBC 9.4  --  9.3 8.1  --   NEUTROABS 5.8  --   --   --   --   HGB 14.9 15.3* 12.7 11.4* 12.3  HCT 46.3* 45.0 38.7 34.3* 37.1  MCV 93.7  --  92.6 91.7  --   PLT 224  --  215 157  --    Cardiac Enzymes: No results for input(s): CKTOTAL, CKMB, CKMBINDEX, TROPONINI in the last 168 hours. BNP: BNP (last 3 results) No results for input(s): BNP in the last 8760 hours.  ProBNP (last 3 results) No results for input(s): PROBNP in the last 8760 hours.  CBG: Recent Labs  Lab 01/13/21 1713 01/15/21 1657  GLUCAP 92 100*       Signed:  Alma Friendly, MD [facility] 01/17/2021, 12:32 PM

## 2021-01-17 NOTE — Progress Notes (Signed)
Physical Therapy Treatment Patient Details Name: Alyssa Odonnell MRN: [id number] DOB: 1934/04/14 Today's Date: 01/17/2021    History of Present Illness 87 y.o. female presented with AMS. CT head negative for acute changes. Suspected metabolic encephalopathy; EEG negative for seizures.  PMH: prior and recent stroke with residual R weakness, HTN, paroxysmal atrial fibrillation Eliquis, hypothyroidism, hypercholesterolemia, dementia, osteoarthritis and depression    PT Comments    Pt received in supine, lethargic and oriented to self only, pt participatory with bed mobility and AAROM UE/LE exercises as able. Pt needing max to totalA for bed mobility with increased time/cues and unable to tolerate or assist with long sit in bed this date, so focused instead on exercises for improved ROM/strengthening. Pt guarding more on BLE>UE and reports mild L knee pain. Pt continues to benefit from PT services to progress toward functional mobility goals. Continue to recommend SNF.   Follow Up Recommendations  SNF     Equipment Recommendations  Wheelchair (measurements PT);Wheelchair cushion (measurements PT);Hospital bed;Other (comment) (lift (if family decides to take pt home))    Recommendations for Other Services OT consult     Precautions / Restrictions Precautions Precautions: Fall Restrictions Weight Bearing Restrictions: No    Mobility  Bed Mobility Overal bed mobility: Needs Assistance Bed Mobility: Rolling Rolling: Total assist         General bed mobility comments: rolling to left side, pt needs hand over hand assist to reach for bed rail and to bend knee on R to try to push but unable to maintain grip on bed rail >10 seconds; attempted long sit in bed however pt not able to pull up on bed rails and actively resisting therapist attempts to raise pt trunk from elevated bed for long sit and totalA so unable to fully sit up in bed.    Transfers                 General transfer  comment: pt too lethargic to attempt, will need +2  Ambulation/Gait                 Stairs             Wheelchair Mobility    Modified Rankin (Stroke Patients Only)       Balance Overall balance assessment: Needs assistance Sitting-balance support: Bilateral upper extremity supported Sitting balance-Leahy Scale: Zero Sitting balance - Comments: pt unable to sit up even with bed rail and therapist assist; totalA                                    Cognition Arousal/Alertness: Lethargic Behavior During Therapy: Flat affect Overall Cognitive Status: No family/caregiver present to determine baseline cognitive functioning                                 General Comments: drowsy throughout; oriented to name, not otherwise answering questions      Exercises General Exercises - Upper Extremity Shoulder Flexion: AROM;Both;5 reps;Supine Elbow Flexion: AAROM;Both;10 reps;Supine Digit Composite Flexion: AROM;Both;5 reps;Supine General Exercises - Lower Extremity Ankle Circles/Pumps: Both;AAROM;10 reps;Supine Heel Slides: AAROM;Both;10 reps;Supine (increased pain reported L knee with AAROM) Hip ABduction/ADduction: AAROM;Both;10 reps;Supine    General Comments General comments (skin integrity, edema, etc.): RN reports pt has been sundowning and recommends PT attempt in mornings      Pertinent Vitals/Pain Pain Assessment:  Faces Faces Pain Scale: Hurts little more Pain Location: L knee with ROM Pain Descriptors / Indicators: Aching;Grimacing Pain Intervention(s): Monitored during session;Repositioned;Limited activity within patient's tolerance    Home Living                      Prior Function            PT Goals (current goals can now be found in the care plan section) Acute Rehab PT Goals Patient Stated Goal: wants to feel more comfortable PT Goal Formulation: Patient unable to participate in goal setting Time For Goal  Achievement: 01/28/21 Potential to Achieve Goals: Fair Progress towards PT goals: Progressing toward goals (slow progress; lethargy limiting)    Frequency    Min 2X/week      PT Plan Current plan remains appropriate    Co-evaluation              AM-PAC PT "6 Clicks" Mobility   Outcome Measure  Help needed turning from your back to your side while in a flat bed without using bedrails?: Total Help needed moving from lying on your back to sitting on the side of a flat bed without using bedrails?: Total Help needed moving to and from a bed to a chair (including a wheelchair)?: Total Help needed standing up from a chair using your arms (e.g., wheelchair or bedside chair)?: Total Help needed to walk in hospital room?: Total Help needed climbing 3-5 steps with a railing? : Total 6 Click Score: 6    End of Session   Activity Tolerance: Patient limited by lethargy Patient left: in bed;with call bell/phone within reach;with bed alarm set (semi-sidelying to L with pillows to offload sacrum and float heels) Nurse Communication: Mobility status PT Visit Diagnosis: Muscle weakness (generalized) (M62.81);Difficulty in walking, not elsewhere classified (R26.2)     Time: 5035-4656 PT Time Calculation (min) (ACUTE ONLY): 22 min  Charges:  $Therapeutic Activity: 8-22 mins                     Khaleed Holan P., PTA Acute Rehabilitation Services Pager: [phone] Office: [place] 01/17/2021, 4:16 PM

## 2021-01-17 NOTE — Progress Notes (Signed)
Attempted to call report x2

## 2021-01-17 NOTE — TOC Transition Note (Addendum)
Transition of Care [facility]) - CM/SW Discharge Note   Patient Details  Name: Alyssa Odonnell MRN: [id number] Date of Birth: [birth year]  [facility]) CM/SW Contact:  Angelita Ingles, RN Phone Number: [phone]  01/17/2021, 1:52 PM   Clinical Narrative:    Patient to discharge back to [facility]. Transport set up via [employer]. Son [name] has returned call and been updated about discharge. Insurance [name] is pending. Per facility patient is able to come without auth. Ref ID # [id number]. Clinicals have been faxed for the second time. Latanya Maudlin has been made aware.  Please call report to  [facility] [phone] Room # 101   Final next level of care: Skilled Nursing Facility Barriers to Discharge: No Barriers Identified   Patient Goals and CMS Choice   CMS Medicare.gov Compare Post Acute Care list provided to:: Other (Comment Required) (from [facility] and to return to [facility]) Choice offered to / list presented to : NA  Discharge Placement              Patient chooses bed at: [facility] Patient to be transferred to facility by: [facility]      Discharge Plan and Services In-house Referral: NA Discharge Planning Services: CM Consult            DME Arranged: N/A DME Agency: NA       HH Arranged: NA HH Agency: NA        Social Determinants of Health (SDOH) Interventions     Readmission Risk Interventions Readmission Risk Prevention Plan 10/02/2019  Post Dischage Appt Not Complete  Appt Comments plan for SNF  Medication Screening Complete  Transportation Screening Complete  Some recent data might be hidden

## 2021-01-17 NOTE — Progress Notes (Signed)
[facility] has been called for transportation. Attempted to call report to receiving facility. PIV's removed and belongings gathered.  Paperwork completed and in folder for [employer]

## 2021-01-17 NOTE — Progress Notes (Signed)
Attempted to call report again x 2. Oda Kilts at [facility] my direct phone number for the receiving RN to call me for report.

## 2021-01-17 NOTE — Progress Notes (Signed)
Report called to [facility].

## 2021-01-17 NOTE — TOC Progression Note (Signed)
Transition of Care [facility]) - Progression Note    Patient Details  Name: Alyssa Odonnell MRN: [id number] Date of Birth: 04-15-1934  Transition of Care [facility]) CM/SW [place], RN Phone Number: [phone]  01/17/2021, 1:03 PM  Clinical Narrative:    Patient to return to [facility]. CM is awaiting d/c summary. Attempted to call Lashae at [facility] to confirm patients return. Message has been left for Latanya Maudlin will await return call.   Expected Discharge Plan: [place] Barriers to Discharge: Continued Medical Work up  Expected Discharge Plan and Services Expected Discharge Plan: [place] In-house Referral: NA Discharge Planning Services: CM Consult   Living arrangements for the past 2 months: [place] Expected Discharge Date: 01/17/21               DME Arranged: N/A DME Agency: NA       HH Arranged: NA HH Agency: NA         Social Determinants of Health (SDOH) Interventions    Readmission Risk Interventions Readmission Risk Prevention Plan 10/02/2019  Post Dischage Appt Not Complete  Appt Comments plan for SNF  Medication Screening Complete  Transportation Screening Complete  Some recent data might be hidden

## 2021-01-18 LAB — CULTURE, BLOOD (ROUTINE X 2)
Culture: NO GROWTH
Culture: NO GROWTH

## 2021-01-20 DIAGNOSIS — G8191 Hemiplegia, unspecified affecting right dominant side: Secondary | ICD-10-CM | POA: Diagnosis not present

## 2021-01-20 DIAGNOSIS — I480 Paroxysmal atrial fibrillation: Secondary | ICD-10-CM | POA: Diagnosis not present

## 2021-01-20 DIAGNOSIS — I639 Cerebral infarction, unspecified: Secondary | ICD-10-CM | POA: Diagnosis not present

## 2021-01-21 DIAGNOSIS — R278 Other lack of coordination: Secondary | ICD-10-CM | POA: Diagnosis not present

## 2021-01-21 DIAGNOSIS — I63432 Cerebral infarction due to embolism of left posterior cerebral artery: Secondary | ICD-10-CM | POA: Diagnosis not present

## 2021-01-21 DIAGNOSIS — R2681 Unsteadiness on feet: Secondary | ICD-10-CM | POA: Diagnosis not present

## 2021-01-21 DIAGNOSIS — N390 Urinary tract infection, site not specified: Secondary | ICD-10-CM | POA: Diagnosis not present

## 2021-01-21 DIAGNOSIS — I639 Cerebral infarction, unspecified: Secondary | ICD-10-CM | POA: Diagnosis not present

## 2021-01-21 DIAGNOSIS — M48061 Spinal stenosis, lumbar region without neurogenic claudication: Secondary | ICD-10-CM | POA: Diagnosis not present

## 2021-01-21 DIAGNOSIS — K311 Adult hypertrophic pyloric stenosis: Secondary | ICD-10-CM | POA: Diagnosis not present

## 2021-01-21 DIAGNOSIS — R488 Other symbolic dysfunctions: Secondary | ICD-10-CM | POA: Diagnosis not present

## 2021-01-21 DIAGNOSIS — M6281 Muscle weakness (generalized): Secondary | ICD-10-CM | POA: Diagnosis not present

## 2021-01-21 DIAGNOSIS — I739 Peripheral vascular disease, unspecified: Secondary | ICD-10-CM | POA: Diagnosis not present

## 2021-01-21 DIAGNOSIS — I69351 Hemiplegia and hemiparesis following cerebral infarction affecting right dominant side: Secondary | ICD-10-CM | POA: Diagnosis not present

## 2021-01-21 DIAGNOSIS — G9341 Metabolic encephalopathy: Secondary | ICD-10-CM | POA: Diagnosis not present

## 2021-01-28 DIAGNOSIS — G8191 Hemiplegia, unspecified affecting right dominant side: Secondary | ICD-10-CM | POA: Diagnosis not present

## 2021-01-28 DIAGNOSIS — I639 Cerebral infarction, unspecified: Secondary | ICD-10-CM | POA: Diagnosis not present

## 2021-01-28 DIAGNOSIS — I480 Paroxysmal atrial fibrillation: Secondary | ICD-10-CM | POA: Diagnosis not present

## 2021-03-03 DIAGNOSIS — G8191 Hemiplegia, unspecified affecting right dominant side: Secondary | ICD-10-CM | POA: Diagnosis not present

## 2021-03-03 DIAGNOSIS — Z515 Encounter for palliative care: Secondary | ICD-10-CM | POA: Diagnosis not present

## 2021-03-03 DIAGNOSIS — I639 Cerebral infarction, unspecified: Secondary | ICD-10-CM | POA: Diagnosis not present

## 2021-03-03 DIAGNOSIS — M4800 Spinal stenosis, site unspecified: Secondary | ICD-10-CM | POA: Diagnosis not present

## 2021-03-07 DIAGNOSIS — M4800 Spinal stenosis, site unspecified: Secondary | ICD-10-CM | POA: Diagnosis not present

## 2021-03-07 DIAGNOSIS — G8191 Hemiplegia, unspecified affecting right dominant side: Secondary | ICD-10-CM | POA: Diagnosis not present

## 2021-03-07 DIAGNOSIS — I639 Cerebral infarction, unspecified: Secondary | ICD-10-CM | POA: Diagnosis not present

## 2021-03-07 DIAGNOSIS — Z515 Encounter for palliative care: Secondary | ICD-10-CM | POA: Diagnosis not present

## 2021-03-10 ENCOUNTER — Ambulatory Visit: Payer: Medicare Other | Admitting: Neurology

## 2021-04-01 DIAGNOSIS — L603 Nail dystrophy: Secondary | ICD-10-CM | POA: Diagnosis not present

## 2021-04-01 DIAGNOSIS — B351 Tinea unguium: Secondary | ICD-10-CM | POA: Diagnosis not present

## 2021-04-01 DIAGNOSIS — M2041 Other hammer toe(s) (acquired), right foot: Secondary | ICD-10-CM | POA: Diagnosis not present

## 2021-04-01 DIAGNOSIS — M2042 Other hammer toe(s) (acquired), left foot: Secondary | ICD-10-CM | POA: Diagnosis not present

## 2021-04-01 DIAGNOSIS — I7091 Generalized atherosclerosis: Secondary | ICD-10-CM | POA: Diagnosis not present

## 2021-04-26 DIAGNOSIS — I2510 Atherosclerotic heart disease of native coronary artery without angina pectoris: Secondary | ICD-10-CM | POA: Diagnosis not present

## 2021-04-26 DIAGNOSIS — I10 Essential (primary) hypertension: Secondary | ICD-10-CM | POA: Diagnosis not present

## 2021-04-28 DIAGNOSIS — I739 Peripheral vascular disease, unspecified: Secondary | ICD-10-CM | POA: Diagnosis not present

## 2021-04-28 DIAGNOSIS — G2581 Restless legs syndrome: Secondary | ICD-10-CM | POA: Diagnosis not present

## 2021-04-28 DIAGNOSIS — I639 Cerebral infarction, unspecified: Secondary | ICD-10-CM | POA: Diagnosis not present

## 2021-04-28 DIAGNOSIS — I480 Paroxysmal atrial fibrillation: Secondary | ICD-10-CM | POA: Diagnosis not present

## 2021-04-28 DIAGNOSIS — M4800 Spinal stenosis, site unspecified: Secondary | ICD-10-CM | POA: Diagnosis not present

## 2021-05-05 DIAGNOSIS — I480 Paroxysmal atrial fibrillation: Secondary | ICD-10-CM | POA: Diagnosis not present

## 2021-05-05 DIAGNOSIS — I7090 Unspecified atherosclerosis: Secondary | ICD-10-CM | POA: Diagnosis not present

## 2021-05-05 DIAGNOSIS — M4800 Spinal stenosis, site unspecified: Secondary | ICD-10-CM | POA: Diagnosis not present

## 2021-05-05 DIAGNOSIS — I639 Cerebral infarction, unspecified: Secondary | ICD-10-CM | POA: Diagnosis not present

## 2021-05-05 DIAGNOSIS — I739 Peripheral vascular disease, unspecified: Secondary | ICD-10-CM | POA: Diagnosis not present

## 2021-05-12 DIAGNOSIS — I480 Paroxysmal atrial fibrillation: Secondary | ICD-10-CM | POA: Diagnosis not present

## 2021-05-12 DIAGNOSIS — I739 Peripheral vascular disease, unspecified: Secondary | ICD-10-CM | POA: Diagnosis not present

## 2021-05-12 DIAGNOSIS — G2581 Restless legs syndrome: Secondary | ICD-10-CM | POA: Diagnosis not present

## 2021-05-12 DIAGNOSIS — M4800 Spinal stenosis, site unspecified: Secondary | ICD-10-CM | POA: Diagnosis not present

## 2021-05-13 DIAGNOSIS — L89312 Pressure ulcer of right buttock, stage 2: Secondary | ICD-10-CM | POA: Diagnosis not present

## 2021-05-13 DIAGNOSIS — L89322 Pressure ulcer of left buttock, stage 2: Secondary | ICD-10-CM | POA: Diagnosis not present

## 2021-05-14 DIAGNOSIS — I739 Peripheral vascular disease, unspecified: Secondary | ICD-10-CM | POA: Diagnosis not present

## 2021-05-14 DIAGNOSIS — I480 Paroxysmal atrial fibrillation: Secondary | ICD-10-CM | POA: Diagnosis not present

## 2021-05-14 DIAGNOSIS — M4800 Spinal stenosis, site unspecified: Secondary | ICD-10-CM | POA: Diagnosis not present

## 2021-05-19 DIAGNOSIS — I480 Paroxysmal atrial fibrillation: Secondary | ICD-10-CM | POA: Diagnosis not present

## 2021-05-19 DIAGNOSIS — M4800 Spinal stenosis, site unspecified: Secondary | ICD-10-CM | POA: Diagnosis not present

## 2021-05-19 DIAGNOSIS — I7090 Unspecified atherosclerosis: Secondary | ICD-10-CM | POA: Diagnosis not present

## 2021-05-19 DIAGNOSIS — I739 Peripheral vascular disease, unspecified: Secondary | ICD-10-CM | POA: Diagnosis not present

## 2021-05-19 DIAGNOSIS — I639 Cerebral infarction, unspecified: Secondary | ICD-10-CM | POA: Diagnosis not present

## 2021-06-03 DIAGNOSIS — I739 Peripheral vascular disease, unspecified: Secondary | ICD-10-CM | POA: Diagnosis not present

## 2021-06-03 DIAGNOSIS — I480 Paroxysmal atrial fibrillation: Secondary | ICD-10-CM | POA: Diagnosis not present

## 2021-06-03 DIAGNOSIS — M4800 Spinal stenosis, site unspecified: Secondary | ICD-10-CM | POA: Diagnosis not present

## 2021-06-03 DIAGNOSIS — I639 Cerebral infarction, unspecified: Secondary | ICD-10-CM | POA: Diagnosis not present

## 2021-06-08 DIAGNOSIS — L89154 Pressure ulcer of sacral region, stage 4: Secondary | ICD-10-CM | POA: Diagnosis not present

## 2021-06-08 DIAGNOSIS — K5900 Constipation, unspecified: Secondary | ICD-10-CM | POA: Diagnosis not present

## 2021-06-08 DIAGNOSIS — M4800 Spinal stenosis, site unspecified: Secondary | ICD-10-CM | POA: Diagnosis not present

## 2021-06-08 DIAGNOSIS — I480 Paroxysmal atrial fibrillation: Secondary | ICD-10-CM | POA: Diagnosis not present
